# Patient Record
Sex: Female | Born: 1950
Health system: Southern US, Community
[De-identification: ages and names within clinical notes are randomized; demographics above are authoritative.]

## PROBLEM LIST (undated history)

## (undated) ENCOUNTER — Ambulatory Visit: Admission: EM | Payer: Medicare PPO | Source: Home / Self Care

## (undated) DIAGNOSIS — K219 Gastro-esophageal reflux disease without esophagitis: Secondary | ICD-10-CM

## (undated) DIAGNOSIS — G709 Myoneural disorder, unspecified: Secondary | ICD-10-CM

## (undated) DIAGNOSIS — N189 Chronic kidney disease, unspecified: Secondary | ICD-10-CM

## (undated) DIAGNOSIS — T7840XA Allergy, unspecified, initial encounter: Secondary | ICD-10-CM

## (undated) DIAGNOSIS — I2510 Atherosclerotic heart disease of native coronary artery without angina pectoris: Secondary | ICD-10-CM

## (undated) DIAGNOSIS — Z862 Personal history of diseases of the blood and blood-forming organs and certain disorders involving the immune mechanism: Secondary | ICD-10-CM

## (undated) DIAGNOSIS — I10 Essential (primary) hypertension: Secondary | ICD-10-CM

## (undated) DIAGNOSIS — Z8639 Personal history of other endocrine, nutritional and metabolic disease: Secondary | ICD-10-CM

## (undated) DIAGNOSIS — K760 Fatty (change of) liver, not elsewhere classified: Secondary | ICD-10-CM

## (undated) DIAGNOSIS — N3281 Overactive bladder: Secondary | ICD-10-CM

## (undated) DIAGNOSIS — R011 Cardiac murmur, unspecified: Secondary | ICD-10-CM

## (undated) DIAGNOSIS — M1990 Unspecified osteoarthritis, unspecified site: Secondary | ICD-10-CM

## (undated) HISTORY — DX: Chronic kidney disease, unspecified: N18.9

## (undated) HISTORY — DX: Allergy, unspecified, initial encounter: T78.40XA

## (undated) HISTORY — DX: Essential (primary) hypertension: I10

## (undated) HISTORY — PX: TURBINATE REDUCTION: SHX6157

## (undated) HISTORY — PX: TONSILLECTOMY: SUR1361

## (undated) HISTORY — PX: LAPAROSCOPY: SHX197

## (undated) HISTORY — DX: Gastro-esophageal reflux disease without esophagitis: K21.9

## (undated) HISTORY — PX: ABDOMINAL HYSTERECTOMY: SHX81

## (undated) HISTORY — DX: Unspecified osteoarthritis, unspecified site: M19.90

## (undated) HISTORY — DX: Personal history of diseases of the blood and blood-forming organs and certain disorders involving the immune mechanism: Z86.2

## (undated) HISTORY — DX: Personal history of other endocrine, nutritional and metabolic disease: Z86.39

---

## 1998-04-04 ENCOUNTER — Other Ambulatory Visit: Admission: RE | Admit: 1998-04-04 | Discharge: 1998-04-04 | Payer: Self-pay | Admitting: Obstetrics & Gynecology

## 1998-09-19 ENCOUNTER — Other Ambulatory Visit: Admission: RE | Admit: 1998-09-19 | Discharge: 1998-09-19 | Payer: Self-pay | Admitting: Obstetrics & Gynecology

## 1999-04-03 ENCOUNTER — Other Ambulatory Visit: Admission: RE | Admit: 1999-04-03 | Discharge: 1999-04-03 | Payer: Self-pay | Admitting: Obstetrics & Gynecology

## 2000-07-30 ENCOUNTER — Other Ambulatory Visit: Admission: RE | Admit: 2000-07-30 | Discharge: 2000-07-30 | Payer: Self-pay | Admitting: Obstetrics & Gynecology

## 2001-11-17 ENCOUNTER — Other Ambulatory Visit: Admission: RE | Admit: 2001-11-17 | Discharge: 2001-11-17 | Payer: Self-pay | Admitting: Obstetrics & Gynecology

## 2002-03-10 ENCOUNTER — Encounter: Payer: Self-pay | Admitting: Internal Medicine

## 2003-03-30 ENCOUNTER — Other Ambulatory Visit: Admission: RE | Admit: 2003-03-30 | Discharge: 2003-03-30 | Payer: Self-pay | Admitting: Obstetrics & Gynecology

## 2005-01-03 ENCOUNTER — Other Ambulatory Visit: Admission: RE | Admit: 2005-01-03 | Discharge: 2005-01-03 | Payer: Self-pay | Admitting: Obstetrics & Gynecology

## 2007-03-04 ENCOUNTER — Ambulatory Visit (HOSPITAL_COMMUNITY): Admission: RE | Admit: 2007-03-04 | Discharge: 2007-03-04 | Payer: Self-pay

## 2007-09-08 DIAGNOSIS — Z862 Personal history of diseases of the blood and blood-forming organs and certain disorders involving the immune mechanism: Secondary | ICD-10-CM

## 2007-09-08 DIAGNOSIS — R197 Diarrhea, unspecified: Secondary | ICD-10-CM

## 2007-09-08 DIAGNOSIS — Z8639 Personal history of other endocrine, nutritional and metabolic disease: Secondary | ICD-10-CM

## 2007-09-08 DIAGNOSIS — M069 Rheumatoid arthritis, unspecified: Secondary | ICD-10-CM | POA: Insufficient documentation

## 2007-09-08 DIAGNOSIS — K760 Fatty (change of) liver, not elsewhere classified: Secondary | ICD-10-CM

## 2007-09-08 HISTORY — DX: Personal history of diseases of the blood and blood-forming organs and certain disorders involving the immune mechanism: Z86.2

## 2007-09-08 HISTORY — DX: Personal history of diseases of the blood and blood-forming organs and certain disorders involving the immune mechanism: Z86.39

## 2007-09-10 ENCOUNTER — Ambulatory Visit: Payer: Self-pay | Admitting: Internal Medicine

## 2007-10-07 ENCOUNTER — Telehealth: Payer: Self-pay | Admitting: Internal Medicine

## 2009-02-11 ENCOUNTER — Encounter (INDEPENDENT_AMBULATORY_CARE_PROVIDER_SITE_OTHER): Payer: Self-pay | Admitting: *Deleted

## 2009-10-21 ENCOUNTER — Telehealth: Payer: Self-pay | Admitting: Internal Medicine

## 2010-03-07 NOTE — Letter (Signed)
Summary: Colonoscopy Letter  [facility]  [address] [place], [place] [address]   Phone: [phone]  Fax: [phone]      February 11, 2009 MRN: [id number]   [facility] [address] [place], [place]  [address]   Dear Ms. Heldman,   According to your medical record, it is time for you to schedule a Colonoscopy. The American Cancer Society recommends this procedure as a method to detect early colon cancer. Patients with a family history of colon cancer, or a personal history of colon polyps or inflammatory bowel disease are at increased risk.  This letter has beeen generated based on the recommendations made at the time of your procedure. If you feel that in your particular situation this may no longer apply, please contact our office.  Please call our office at [phone] to schedule this appointment or to update your records at your earliest convenience.  Thank you for cooperating with [place] to provide you with the very best care possible.   Sincerely,  Hedwig Morton. Juanda Chance, M.D.  [facility] [phone]

## 2010-03-07 NOTE — Progress Notes (Signed)
Summary: Schedule Colonoscopy  Phone Note Outgoing Call Call back at [facility] Phone [phone]   Call placed by: Harlow Mares CMA [facility]),  October 21, 2009 9:24 AM Call placed to: Patient Summary of Call: advised pt she is due for her colonoscopy but she delined.  Initial call taken by: Harlow Mares CMA [facility]),  October 21, 2009 9:24 AM

## 2013-09-04 ENCOUNTER — Encounter: Payer: Self-pay | Admitting: Internal Medicine

## 2015-05-03 ENCOUNTER — Other Ambulatory Visit: Payer: Self-pay | Admitting: Obstetrics & Gynecology

## 2015-05-03 DIAGNOSIS — R928 Other abnormal and inconclusive findings on diagnostic imaging of breast: Secondary | ICD-10-CM

## 2015-05-06 ENCOUNTER — Other Ambulatory Visit: Payer: Self-pay

## 2015-05-24 ENCOUNTER — Ambulatory Visit
Admission: RE | Admit: 2015-05-24 | Discharge: 2015-05-24 | Disposition: A | Discharge status: Home / Self Care | Payer: BC Managed Care – PPO | Source: Ambulatory Visit | Attending: Obstetrics & Gynecology | Admitting: Obstetrics & Gynecology

## 2015-05-24 DIAGNOSIS — R928 Other abnormal and inconclusive findings on diagnostic imaging of breast: Secondary | ICD-10-CM

## 2015-07-01 DIAGNOSIS — M05742 Rheumatoid arthritis with rheumatoid factor of left hand without organ or systems involvement: Secondary | ICD-10-CM

## 2015-07-01 DIAGNOSIS — J301 Allergic rhinitis due to pollen: Secondary | ICD-10-CM | POA: Insufficient documentation

## 2015-07-01 DIAGNOSIS — N959 Unspecified menopausal and perimenopausal disorder: Secondary | ICD-10-CM | POA: Insufficient documentation

## 2015-07-01 DIAGNOSIS — M05741 Rheumatoid arthritis with rheumatoid factor of right hand without organ or systems involvement: Secondary | ICD-10-CM | POA: Insufficient documentation

## 2015-07-01 DIAGNOSIS — I10 Essential (primary) hypertension: Secondary | ICD-10-CM | POA: Insufficient documentation

## 2015-07-07 ENCOUNTER — Ambulatory Visit: Payer: Self-pay | Admitting: Family Medicine

## 2015-11-09 DIAGNOSIS — M150 Primary generalized (osteo)arthritis: Secondary | ICD-10-CM | POA: Diagnosis not present

## 2015-11-09 DIAGNOSIS — M0589 Other rheumatoid arthritis with rheumatoid factor of multiple sites: Secondary | ICD-10-CM | POA: Diagnosis not present

## 2015-11-09 DIAGNOSIS — Z79899 Other long term (current) drug therapy: Secondary | ICD-10-CM | POA: Diagnosis not present

## 2015-11-15 DIAGNOSIS — M0589 Other rheumatoid arthritis with rheumatoid factor of multiple sites: Secondary | ICD-10-CM | POA: Diagnosis not present

## 2015-12-19 DIAGNOSIS — M0589 Other rheumatoid arthritis with rheumatoid factor of multiple sites: Secondary | ICD-10-CM | POA: Diagnosis not present

## 2015-12-27 DIAGNOSIS — I10 Essential (primary) hypertension: Secondary | ICD-10-CM | POA: Diagnosis not present

## 2015-12-27 DIAGNOSIS — Z79899 Other long term (current) drug therapy: Secondary | ICD-10-CM | POA: Diagnosis not present

## 2016-01-02 DIAGNOSIS — M05742 Rheumatoid arthritis with rheumatoid factor of left hand without organ or systems involvement: Secondary | ICD-10-CM | POA: Diagnosis not present

## 2016-01-02 DIAGNOSIS — M05741 Rheumatoid arthritis with rheumatoid factor of right hand without organ or systems involvement: Secondary | ICD-10-CM | POA: Diagnosis not present

## 2016-01-02 DIAGNOSIS — Z23 Encounter for immunization: Secondary | ICD-10-CM | POA: Diagnosis not present

## 2016-01-02 DIAGNOSIS — Z6841 Body Mass Index (BMI) 40.0 and over, adult: Secondary | ICD-10-CM | POA: Diagnosis not present

## 2016-01-02 DIAGNOSIS — I10 Essential (primary) hypertension: Secondary | ICD-10-CM | POA: Diagnosis not present

## 2016-01-02 DIAGNOSIS — J301 Allergic rhinitis due to pollen: Secondary | ICD-10-CM | POA: Diagnosis not present

## 2016-01-02 DIAGNOSIS — N959 Unspecified menopausal and perimenopausal disorder: Secondary | ICD-10-CM | POA: Diagnosis not present

## 2016-01-02 DIAGNOSIS — Z79899 Other long term (current) drug therapy: Secondary | ICD-10-CM | POA: Diagnosis not present

## 2016-01-19 DIAGNOSIS — M0589 Other rheumatoid arthritis with rheumatoid factor of multiple sites: Secondary | ICD-10-CM | POA: Diagnosis not present

## 2016-02-16 DIAGNOSIS — M0589 Other rheumatoid arthritis with rheumatoid factor of multiple sites: Secondary | ICD-10-CM | POA: Diagnosis not present

## 2016-03-15 DIAGNOSIS — M0589 Other rheumatoid arthritis with rheumatoid factor of multiple sites: Secondary | ICD-10-CM | POA: Diagnosis not present

## 2016-03-27 DIAGNOSIS — H35363 Drusen (degenerative) of macula, bilateral: Secondary | ICD-10-CM | POA: Diagnosis not present

## 2016-04-19 DIAGNOSIS — M0589 Other rheumatoid arthritis with rheumatoid factor of multiple sites: Secondary | ICD-10-CM | POA: Diagnosis not present

## 2016-05-09 DIAGNOSIS — M0589 Other rheumatoid arthritis with rheumatoid factor of multiple sites: Secondary | ICD-10-CM | POA: Diagnosis not present

## 2016-05-09 DIAGNOSIS — Z79899 Other long term (current) drug therapy: Secondary | ICD-10-CM | POA: Diagnosis not present

## 2016-05-09 DIAGNOSIS — M150 Primary generalized (osteo)arthritis: Secondary | ICD-10-CM | POA: Diagnosis not present

## 2016-05-09 DIAGNOSIS — M25561 Pain in right knee: Secondary | ICD-10-CM | POA: Diagnosis not present

## 2016-05-09 DIAGNOSIS — M170 Bilateral primary osteoarthritis of knee: Secondary | ICD-10-CM | POA: Diagnosis not present

## 2016-05-25 DIAGNOSIS — M0589 Other rheumatoid arthritis with rheumatoid factor of multiple sites: Secondary | ICD-10-CM | POA: Diagnosis not present

## 2016-06-05 DIAGNOSIS — Z1272 Encounter for screening for malignant neoplasm of vagina: Secondary | ICD-10-CM | POA: Diagnosis not present

## 2016-06-05 DIAGNOSIS — Z1231 Encounter for screening mammogram for malignant neoplasm of breast: Secondary | ICD-10-CM | POA: Diagnosis not present

## 2016-06-05 DIAGNOSIS — Z124 Encounter for screening for malignant neoplasm of cervix: Secondary | ICD-10-CM | POA: Diagnosis not present

## 2016-06-05 DIAGNOSIS — Z01419 Encounter for gynecological examination (general) (routine) without abnormal findings: Secondary | ICD-10-CM | POA: Diagnosis not present

## 2016-06-05 DIAGNOSIS — Z6841 Body Mass Index (BMI) 40.0 and over, adult: Secondary | ICD-10-CM | POA: Diagnosis not present

## 2016-06-06 DIAGNOSIS — D492 Neoplasm of unspecified behavior of bone, soft tissue, and skin: Secondary | ICD-10-CM | POA: Diagnosis not present

## 2016-06-06 DIAGNOSIS — I872 Venous insufficiency (chronic) (peripheral): Secondary | ICD-10-CM | POA: Diagnosis not present

## 2016-06-06 DIAGNOSIS — L820 Inflamed seborrheic keratosis: Secondary | ICD-10-CM | POA: Diagnosis not present

## 2016-06-06 DIAGNOSIS — D229 Melanocytic nevi, unspecified: Secondary | ICD-10-CM | POA: Diagnosis not present

## 2016-06-12 DIAGNOSIS — L089 Local infection of the skin and subcutaneous tissue, unspecified: Secondary | ICD-10-CM | POA: Diagnosis not present

## 2016-06-12 DIAGNOSIS — Z5189 Encounter for other specified aftercare: Secondary | ICD-10-CM | POA: Diagnosis not present

## 2016-06-16 DIAGNOSIS — M5440 Lumbago with sciatica, unspecified side: Secondary | ICD-10-CM | POA: Diagnosis not present

## 2016-06-22 DIAGNOSIS — M0589 Other rheumatoid arthritis with rheumatoid factor of multiple sites: Secondary | ICD-10-CM | POA: Diagnosis not present

## 2016-06-27 DIAGNOSIS — M25562 Pain in left knee: Secondary | ICD-10-CM | POA: Diagnosis not present

## 2016-06-27 DIAGNOSIS — M170 Bilateral primary osteoarthritis of knee: Secondary | ICD-10-CM | POA: Diagnosis not present

## 2016-06-27 DIAGNOSIS — M25561 Pain in right knee: Secondary | ICD-10-CM | POA: Diagnosis not present

## 2016-07-04 ENCOUNTER — Encounter: Payer: Self-pay | Admitting: Obstetrics & Gynecology

## 2016-07-12 DIAGNOSIS — M25552 Pain in left hip: Secondary | ICD-10-CM | POA: Diagnosis not present

## 2016-07-12 DIAGNOSIS — M25551 Pain in right hip: Secondary | ICD-10-CM | POA: Diagnosis not present

## 2016-07-12 DIAGNOSIS — M25561 Pain in right knee: Secondary | ICD-10-CM | POA: Diagnosis not present

## 2016-07-12 DIAGNOSIS — M0589 Other rheumatoid arthritis with rheumatoid factor of multiple sites: Secondary | ICD-10-CM | POA: Diagnosis not present

## 2016-07-12 DIAGNOSIS — M150 Primary generalized (osteo)arthritis: Secondary | ICD-10-CM | POA: Diagnosis not present

## 2016-07-12 DIAGNOSIS — M170 Bilateral primary osteoarthritis of knee: Secondary | ICD-10-CM | POA: Diagnosis not present

## 2016-07-12 DIAGNOSIS — Z79899 Other long term (current) drug therapy: Secondary | ICD-10-CM | POA: Diagnosis not present

## 2016-07-20 ENCOUNTER — Telehealth: Payer: Self-pay | Admitting: Family Medicine

## 2016-07-20 NOTE — Telephone Encounter (Signed)
ROI to [facility]

## 2016-07-23 DIAGNOSIS — M0589 Other rheumatoid arthritis with rheumatoid factor of multiple sites: Secondary | ICD-10-CM | POA: Diagnosis not present

## 2016-07-23 DIAGNOSIS — R5383 Other fatigue: Secondary | ICD-10-CM | POA: Diagnosis not present

## 2016-07-24 DIAGNOSIS — M170 Bilateral primary osteoarthritis of knee: Secondary | ICD-10-CM | POA: Diagnosis not present

## 2016-07-24 DIAGNOSIS — M25561 Pain in right knee: Secondary | ICD-10-CM | POA: Diagnosis not present

## 2016-07-24 DIAGNOSIS — M0589 Other rheumatoid arthritis with rheumatoid factor of multiple sites: Secondary | ICD-10-CM | POA: Diagnosis not present

## 2016-07-24 DIAGNOSIS — Z79899 Other long term (current) drug therapy: Secondary | ICD-10-CM | POA: Diagnosis not present

## 2016-07-24 DIAGNOSIS — M150 Primary generalized (osteo)arthritis: Secondary | ICD-10-CM | POA: Diagnosis not present

## 2016-07-26 NOTE — Telephone Encounter (Signed)
Rec'd from [facility] forward 50 pages to Dr. Caryl Bis

## 2016-08-15 DIAGNOSIS — M150 Primary generalized (osteo)arthritis: Secondary | ICD-10-CM | POA: Diagnosis not present

## 2016-08-15 DIAGNOSIS — M0589 Other rheumatoid arthritis with rheumatoid factor of multiple sites: Secondary | ICD-10-CM | POA: Diagnosis not present

## 2016-08-15 DIAGNOSIS — M25561 Pain in right knee: Secondary | ICD-10-CM | POA: Diagnosis not present

## 2016-08-15 DIAGNOSIS — Z79899 Other long term (current) drug therapy: Secondary | ICD-10-CM | POA: Diagnosis not present

## 2016-08-21 ENCOUNTER — Telehealth: Payer: Self-pay | Admitting: Family Medicine

## 2016-08-21 DIAGNOSIS — M0589 Other rheumatoid arthritis with rheumatoid factor of multiple sites: Secondary | ICD-10-CM | POA: Diagnosis not present

## 2016-08-21 NOTE — Telephone Encounter (Signed)
Called and left message for Wanda Lang in Medical Record to find out when she sent out records to the office.

## 2016-08-21 NOTE — Telephone Encounter (Signed)
I have not received records.

## 2016-08-21 NOTE — Telephone Encounter (Signed)
Pt called and stated that she had medical records sent here from [facility] center. Pt is not yet establish she is establishing next week with Dr. Caryl Bis, Pt is trying to get set up with a gastroenterologist but they will not make her an appt until they have a copy of the records from [facility]. She is looking to go to [facility]. Please advise, thank you!  Call pt @ [phone] (may leave msg)

## 2016-08-30 ENCOUNTER — Ambulatory Visit: Payer: No Typology Code available for payment source | Admitting: Family Medicine

## 2016-09-02 IMAGING — MG MM DIAG BREAST TOMO UNI RIGHT
4 series · 4 of 12 positions shown · non-contrast
Comparison: Previous exam(s).

ACR Breast Density Category a: The breast tissue is almost entirely
fatty.

CLINICAL DATA: Screening recall for possible right breast mass.

EXAM:
2D DIGITAL DIAGNOSTIC UNILATERAL RIGHT MAMMOGRAM WITH CAD AND
ADJUNCT TOMO
RIGHT BREAST ULTRASOUND

[R CC]
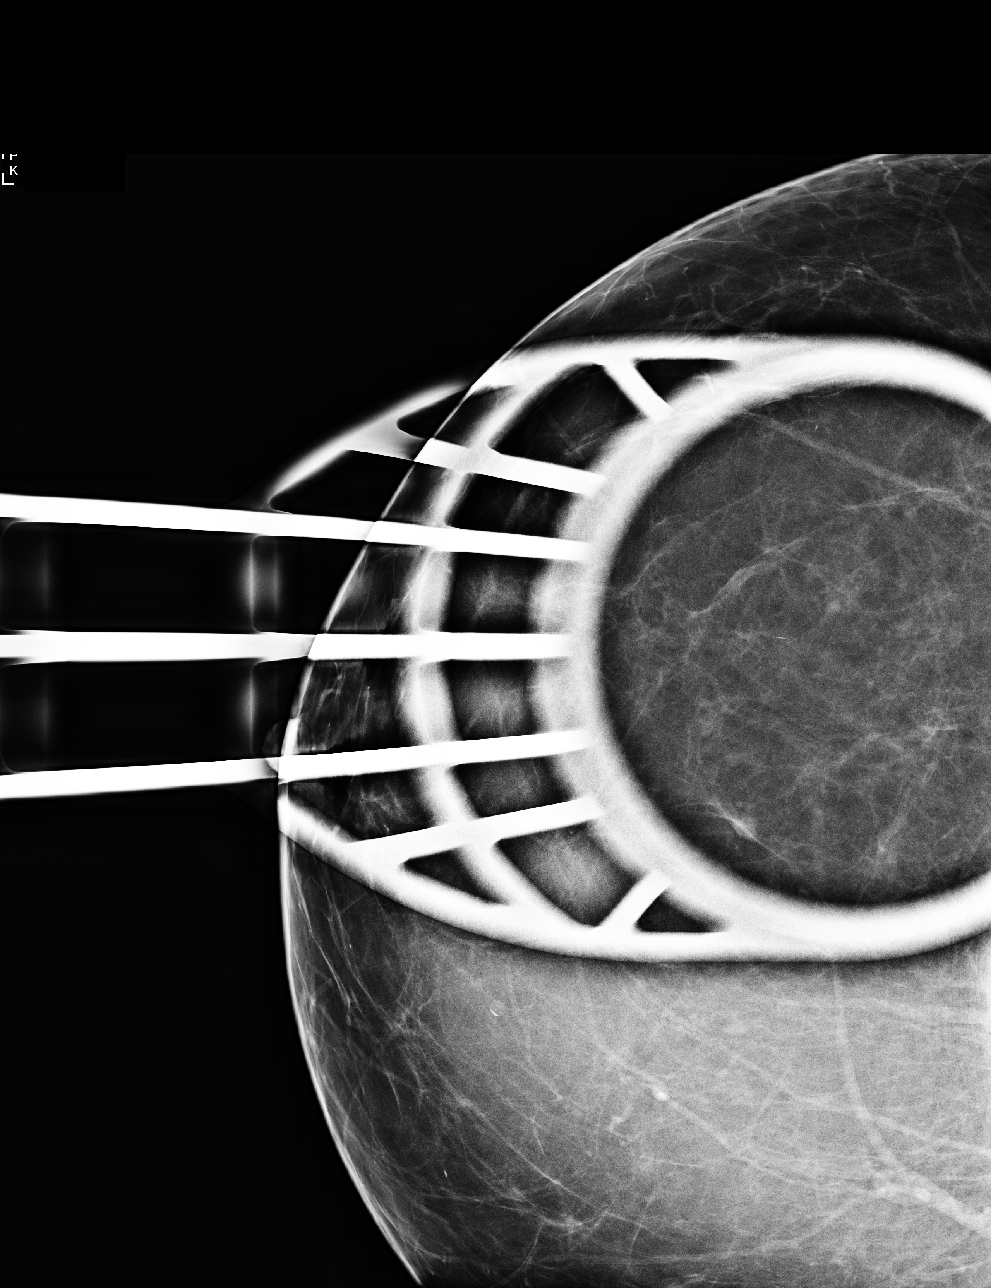

[R MLO]
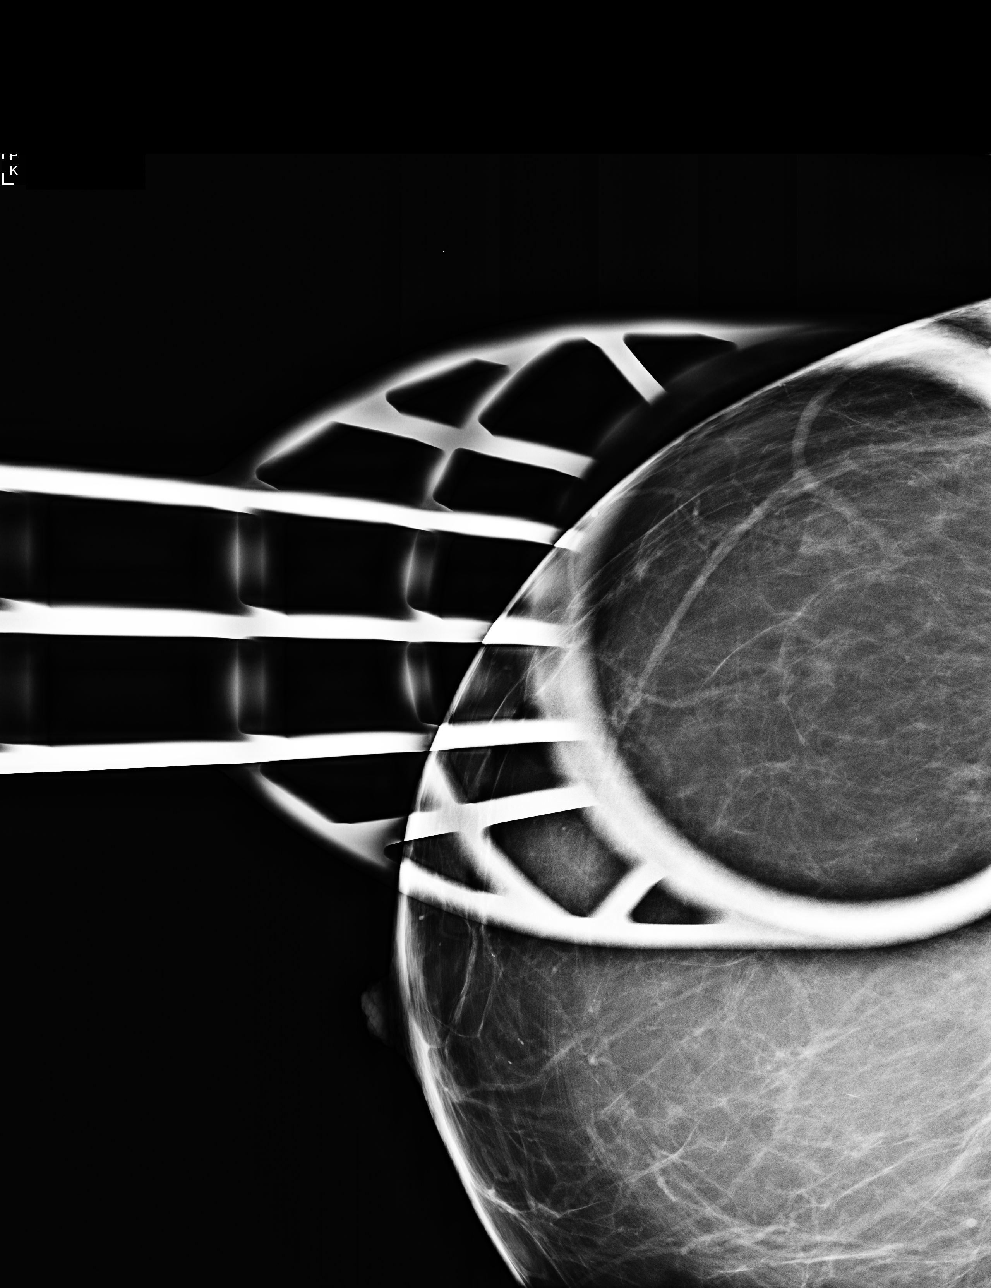

[R CC tomo · tomo slice 37/73.0]
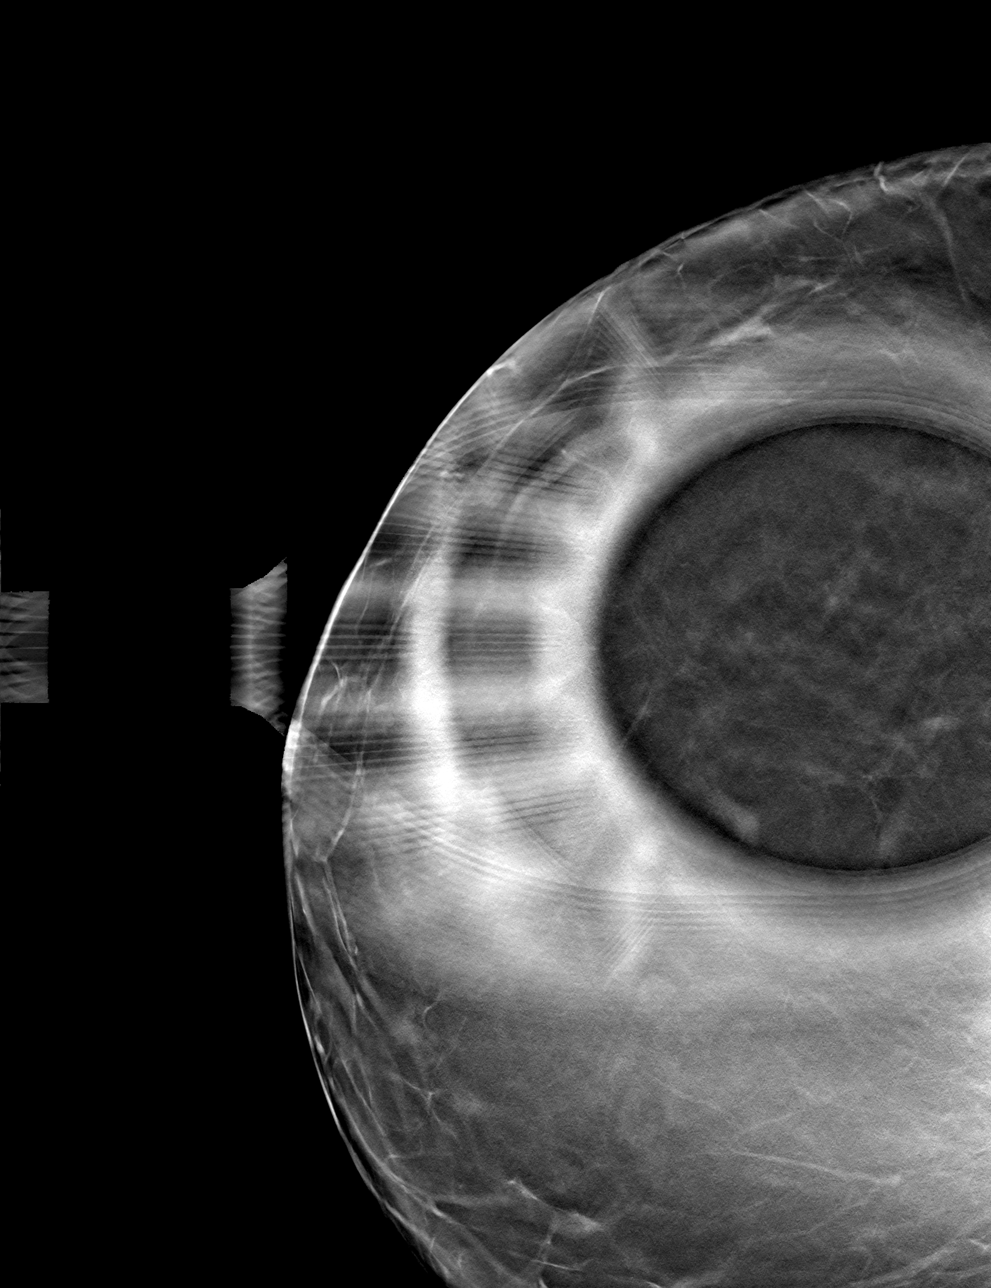

[R MLO tomo · tomo slice 43/84.0]
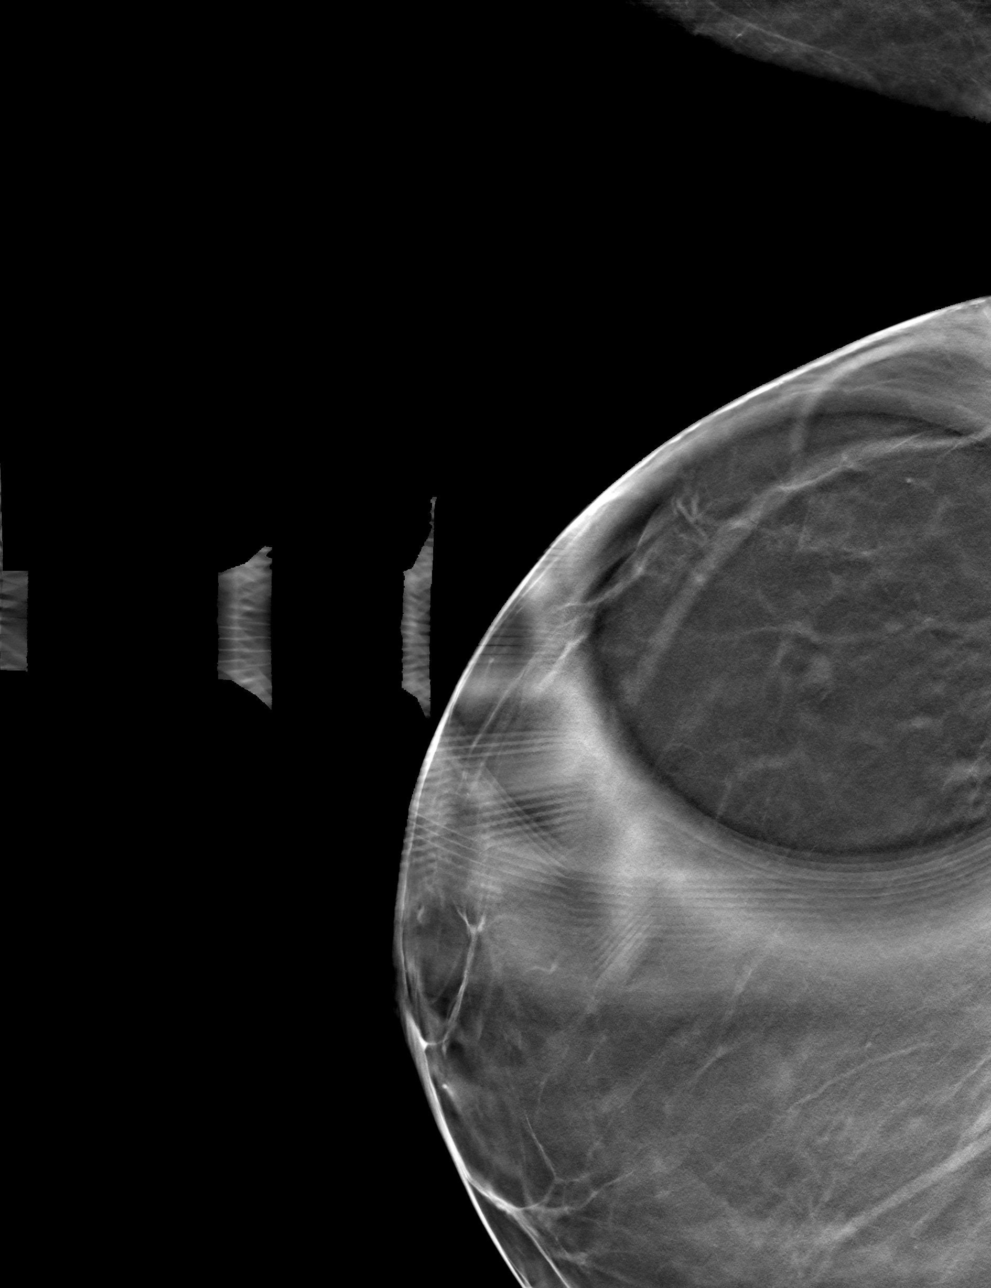

[4 of 12 positions shown; findings below may reference images not displayed]

FINDINGS: Spot compression CC and MLO tomograms were performed of the right
breast. The initially questioned oval circumscribed mass in the
slightly upper outer right breast measuring approximately 4-5 mm is
less well seen on today's images, better visualized on the initial
screening mammogram images.

Mammographic images were processed with CAD.

Physical examination of the upper-outer right breast does not reveal
any palpable masses.

Targeted ultrasound of the right breast was performed demonstrating
a cluster of microcysts at the [DATE] position 6 cm from the nipple
measuring 0.4 x 0.3 x 0.3 cm. This corresponds well with mammography
findings.
IMPRESSION: Cluster of microcysts in the right breast. There is no mammographic
evidence of malignancy in the right breast.

RECOMMENDATION:
Annual routine screening mammography, for which the patient will be
due April 2016.

I have discussed the findings and recommendations with the patient.
Results were also provided in writing at the conclusion of the
visit. If applicable, a reminder letter will be sent to the patient
regarding the next appointment.

BI-RADS CATEGORY  2: Benign.

## 2016-09-05 DIAGNOSIS — M25562 Pain in left knee: Secondary | ICD-10-CM | POA: Diagnosis not present

## 2016-09-05 DIAGNOSIS — M79671 Pain in right foot: Secondary | ICD-10-CM | POA: Diagnosis not present

## 2016-09-05 DIAGNOSIS — M25561 Pain in right knee: Secondary | ICD-10-CM | POA: Diagnosis not present

## 2016-09-05 DIAGNOSIS — M79672 Pain in left foot: Secondary | ICD-10-CM | POA: Diagnosis not present

## 2016-09-13 DIAGNOSIS — M79672 Pain in left foot: Secondary | ICD-10-CM | POA: Diagnosis not present

## 2016-09-13 DIAGNOSIS — M25562 Pain in left knee: Secondary | ICD-10-CM | POA: Diagnosis not present

## 2016-09-13 DIAGNOSIS — M79671 Pain in right foot: Secondary | ICD-10-CM | POA: Diagnosis not present

## 2016-09-13 DIAGNOSIS — M25561 Pain in right knee: Secondary | ICD-10-CM | POA: Diagnosis not present

## 2016-09-17 DIAGNOSIS — M25562 Pain in left knee: Secondary | ICD-10-CM | POA: Diagnosis not present

## 2016-09-17 DIAGNOSIS — M79671 Pain in right foot: Secondary | ICD-10-CM | POA: Diagnosis not present

## 2016-09-17 DIAGNOSIS — M25561 Pain in right knee: Secondary | ICD-10-CM | POA: Diagnosis not present

## 2016-09-17 DIAGNOSIS — M79672 Pain in left foot: Secondary | ICD-10-CM | POA: Diagnosis not present

## 2016-09-18 DIAGNOSIS — M0589 Other rheumatoid arthritis with rheumatoid factor of multiple sites: Secondary | ICD-10-CM | POA: Diagnosis not present

## 2016-09-20 DIAGNOSIS — M25562 Pain in left knee: Secondary | ICD-10-CM | POA: Diagnosis not present

## 2016-09-20 DIAGNOSIS — M25561 Pain in right knee: Secondary | ICD-10-CM | POA: Diagnosis not present

## 2016-09-20 DIAGNOSIS — M79672 Pain in left foot: Secondary | ICD-10-CM | POA: Diagnosis not present

## 2016-09-20 DIAGNOSIS — M79671 Pain in right foot: Secondary | ICD-10-CM | POA: Diagnosis not present

## 2016-09-25 DIAGNOSIS — M79672 Pain in left foot: Secondary | ICD-10-CM | POA: Diagnosis not present

## 2016-09-25 DIAGNOSIS — M25561 Pain in right knee: Secondary | ICD-10-CM | POA: Diagnosis not present

## 2016-09-25 DIAGNOSIS — M25562 Pain in left knee: Secondary | ICD-10-CM | POA: Diagnosis not present

## 2016-09-25 DIAGNOSIS — M79671 Pain in right foot: Secondary | ICD-10-CM | POA: Diagnosis not present

## 2016-09-27 DIAGNOSIS — M25561 Pain in right knee: Secondary | ICD-10-CM | POA: Diagnosis not present

## 2016-09-27 DIAGNOSIS — M79672 Pain in left foot: Secondary | ICD-10-CM | POA: Diagnosis not present

## 2016-09-27 DIAGNOSIS — M25562 Pain in left knee: Secondary | ICD-10-CM | POA: Diagnosis not present

## 2016-09-27 DIAGNOSIS — M79671 Pain in right foot: Secondary | ICD-10-CM | POA: Diagnosis not present

## 2016-10-01 DIAGNOSIS — M79671 Pain in right foot: Secondary | ICD-10-CM | POA: Diagnosis not present

## 2016-10-01 DIAGNOSIS — M79672 Pain in left foot: Secondary | ICD-10-CM | POA: Diagnosis not present

## 2016-10-01 DIAGNOSIS — M25561 Pain in right knee: Secondary | ICD-10-CM | POA: Diagnosis not present

## 2016-10-01 DIAGNOSIS — M25562 Pain in left knee: Secondary | ICD-10-CM | POA: Diagnosis not present

## 2016-10-03 DIAGNOSIS — M25561 Pain in right knee: Secondary | ICD-10-CM | POA: Diagnosis not present

## 2016-10-03 DIAGNOSIS — M25562 Pain in left knee: Secondary | ICD-10-CM | POA: Diagnosis not present

## 2016-10-03 DIAGNOSIS — M79672 Pain in left foot: Secondary | ICD-10-CM | POA: Diagnosis not present

## 2016-10-03 DIAGNOSIS — M79671 Pain in right foot: Secondary | ICD-10-CM | POA: Diagnosis not present

## 2016-10-09 DIAGNOSIS — M79672 Pain in left foot: Secondary | ICD-10-CM | POA: Diagnosis not present

## 2016-10-09 DIAGNOSIS — M25561 Pain in right knee: Secondary | ICD-10-CM | POA: Diagnosis not present

## 2016-10-09 DIAGNOSIS — M25562 Pain in left knee: Secondary | ICD-10-CM | POA: Diagnosis not present

## 2016-10-09 DIAGNOSIS — M79671 Pain in right foot: Secondary | ICD-10-CM | POA: Diagnosis not present

## 2016-10-11 DIAGNOSIS — M25561 Pain in right knee: Secondary | ICD-10-CM | POA: Diagnosis not present

## 2016-10-11 DIAGNOSIS — M25562 Pain in left knee: Secondary | ICD-10-CM | POA: Diagnosis not present

## 2016-10-11 DIAGNOSIS — M79671 Pain in right foot: Secondary | ICD-10-CM | POA: Diagnosis not present

## 2016-10-11 DIAGNOSIS — M79672 Pain in left foot: Secondary | ICD-10-CM | POA: Diagnosis not present

## 2016-10-16 DIAGNOSIS — M25562 Pain in left knee: Secondary | ICD-10-CM | POA: Diagnosis not present

## 2016-10-16 DIAGNOSIS — M25561 Pain in right knee: Secondary | ICD-10-CM | POA: Diagnosis not present

## 2016-10-16 DIAGNOSIS — M79672 Pain in left foot: Secondary | ICD-10-CM | POA: Diagnosis not present

## 2016-10-16 DIAGNOSIS — M0589 Other rheumatoid arthritis with rheumatoid factor of multiple sites: Secondary | ICD-10-CM | POA: Diagnosis not present

## 2016-10-16 DIAGNOSIS — M79671 Pain in right foot: Secondary | ICD-10-CM | POA: Diagnosis not present

## 2016-10-18 DIAGNOSIS — M79671 Pain in right foot: Secondary | ICD-10-CM | POA: Diagnosis not present

## 2016-10-18 DIAGNOSIS — M79672 Pain in left foot: Secondary | ICD-10-CM | POA: Diagnosis not present

## 2016-10-18 DIAGNOSIS — M25561 Pain in right knee: Secondary | ICD-10-CM | POA: Diagnosis not present

## 2016-10-18 DIAGNOSIS — M25562 Pain in left knee: Secondary | ICD-10-CM | POA: Diagnosis not present

## 2016-10-22 ENCOUNTER — Ambulatory Visit: Payer: No Typology Code available for payment source | Admitting: Family Medicine

## 2016-10-26 DIAGNOSIS — M25561 Pain in right knee: Secondary | ICD-10-CM | POA: Diagnosis not present

## 2016-10-26 DIAGNOSIS — M79672 Pain in left foot: Secondary | ICD-10-CM | POA: Diagnosis not present

## 2016-10-26 DIAGNOSIS — M79671 Pain in right foot: Secondary | ICD-10-CM | POA: Diagnosis not present

## 2016-10-26 DIAGNOSIS — M25562 Pain in left knee: Secondary | ICD-10-CM | POA: Diagnosis not present

## 2016-11-02 ENCOUNTER — Ambulatory Visit (INDEPENDENT_AMBULATORY_CARE_PROVIDER_SITE_OTHER): Payer: Medicare Other | Admitting: Family Medicine

## 2016-11-02 ENCOUNTER — Encounter: Payer: Self-pay | Admitting: Family Medicine

## 2016-11-02 VITALS — BP 110/72 | HR 87 | Temp 97.8°F | Ht 64.5 in | Wt 263.8 lb

## 2016-11-02 DIAGNOSIS — M25561 Pain in right knee: Secondary | ICD-10-CM | POA: Diagnosis not present

## 2016-11-02 DIAGNOSIS — J309 Allergic rhinitis, unspecified: Secondary | ICD-10-CM | POA: Diagnosis not present

## 2016-11-02 DIAGNOSIS — G250 Essential tremor: Secondary | ICD-10-CM | POA: Insufficient documentation

## 2016-11-02 DIAGNOSIS — Z8679 Personal history of other diseases of the circulatory system: Secondary | ICD-10-CM | POA: Insufficient documentation

## 2016-11-02 DIAGNOSIS — M79671 Pain in right foot: Secondary | ICD-10-CM | POA: Diagnosis not present

## 2016-11-02 DIAGNOSIS — I10 Essential (primary) hypertension: Secondary | ICD-10-CM | POA: Diagnosis not present

## 2016-11-02 DIAGNOSIS — R251 Tremor, unspecified: Secondary | ICD-10-CM | POA: Diagnosis not present

## 2016-11-02 DIAGNOSIS — R197 Diarrhea, unspecified: Secondary | ICD-10-CM | POA: Diagnosis not present

## 2016-11-02 DIAGNOSIS — M25562 Pain in left knee: Secondary | ICD-10-CM | POA: Diagnosis not present

## 2016-11-02 DIAGNOSIS — K529 Noninfective gastroenteritis and colitis, unspecified: Secondary | ICD-10-CM | POA: Diagnosis not present

## 2016-11-02 DIAGNOSIS — M79672 Pain in left foot: Secondary | ICD-10-CM | POA: Diagnosis not present

## 2016-11-02 NOTE — Assessment & Plan Note (Signed)
Suspect symptoms of cough and postnasal drip related to allergies. Suggested adding Flonase or Nasonex. Continue other medications.

## 2016-11-02 NOTE — Progress Notes (Signed)
[name], MD Phone: [phone]  Wanda Lang is a 66 y.o. female who presents today for new patient visit.  Patient notes she has allergic rhinitis. Has postnasal drip intermittently. Occasional dry cough. Occasional congestion in her nose and ears. Takes Claritin. Occasional Sudafed. Some Mucinex. Has used Nasonex and Flonase in the past.  Notes intermittently over the last year her hands will shake if she is holding something such as paper or an ipad. Does not bother her when she is drinking or writing or eating. Comes and goes. Her mother had similar symptoms.  Hypertension: Not checking at home. Taking HCTZ. No chest pain, shortness breath, or edema.  Chronic diarrhea: Has had this since 2001 when she went on medication for rheumatoid arthritis. Has had 2 colonoscopies since then nobody is unable to give her a diagnosis. Blood in her stool. No abdominal pain.  Active Ambulatory Problems    Diagnosis Date Noted  . FATTY LIVER DISEASE 09/08/2007  . ARTHRITIS, RHEUMATOID 09/08/2007  . DIARRHEA 09/08/2007  . LIVER FUNCTION TESTS, ABNORMAL, HX OF 09/08/2007  . Allergic rhinitis 11/02/2016  . Tremor 11/02/2016  . Hypertension 11/02/2016   Resolved Ambulatory Problems    Diagnosis Date Noted  . No Resolved Ambulatory Problems   Past Medical History:  Diagnosis Date  . Allergy   . Arthritis   . Chronic kidney disease   . GERD (gastroesophageal reflux disease)   . Hypertension     Family History  Problem Relation Age of Onset  . Alcohol abuse Mother   . Arthritis Mother   . Hyperlipidemia Mother   . Heart disease Mother   . Stroke Mother   . Hypertension Mother   . Depression Mother   . Anxiety disorder Mother   . Arthritis Father   . Lung cancer Paternal Grandfather   . Kidney disease Paternal Grandfather     Social History   Social History  . Marital status: Married    Spouse name: N/A  . Number of children: N/A  . Years of education: N/A    Occupational History  . Not on file.   Social History Main Topics  . Smoking status: Never Smoker  . Smokeless tobacco: Never Used  . Alcohol use Not on file  . Drug use: Unknown  . Sexual activity: Not on file   Other Topics Concern  . Not on file   Social History Narrative  . No narrative on file    ROS  General:  Negative for nexplained weight loss, fever Skin: Negative for new or changing mole, sore that won't heal HEENT: Negative for trouble hearing, trouble seeing, ringing in ears, mouth sores, hoarseness, change in voice, dysphagia. CV:  Negative for chest pain, dyspnea, edema, palpitations Resp: Positive for cough, negative for dyspnea, hemoptysis GI: Positive for diarrhea, Negative for nausea, vomiting, constipation, abdominal pain, melena, hematochezia. GU: Negative for dysuria, incontinence, urinary hesitance, hematuria, vaginal or penile discharge, polyuria, sexual difficulty, lumps in testicle or breasts MSK: Negative for muscle cramps or aches, joint pain or swelling Neuro: Positive for tremor, Negative for headaches, weakness, numbness, dizziness, passing out/fainting Psych: Negative for depression, anxiety, memory problems  Objective  Physical Exam Vitals:   11/02/16 1544  BP: 110/72  Pulse: 87  Temp: 97.8 F (36.6 C)  SpO2: 96%    BP Readings from Last 3 Encounters:  11/02/16 110/72  09/10/07 128/78   Wt Readings from Last 3 Encounters:  11/02/16 263 lb 12.8 oz (119.7 kg)  09/10/07 Marland Kitchen)  266 lb 2.1 oz (120.7 kg)    Physical Exam  Constitutional: No distress.  HENT:  Head: Normocephalic and atraumatic.  Mouth/Throat: Oropharynx is clear and moist. No oropharyngeal exudate.  Eyes: Pupils are equal, round, and reactive to light. Conjunctivae are normal.  Cardiovascular: Normal rate, regular rhythm and normal heart sounds.   Pulmonary/Chest: Effort normal and breath sounds normal.  Abdominal: Soft. Bowel sounds are normal. She exhibits no  distension. There is no tenderness. There is no rebound and no guarding.  Musculoskeletal: She exhibits no edema.  Neurological: She is alert.  CN 3-12 intact, 5/5 strength in bilateral biceps, triceps, grip, quads, hamstrings, plantar and dorsiflexion, sensation to light touch intact in bilateral UE and LE, normal gait, normal finger to nose, no tremor noted, no cogwheel rigidity   Skin: Skin is warm and dry. She is not diaphoretic.  Psychiatric: Mood and affect normal.     Assessment/Plan:   DIARRHEA Chronic issue. We'll refer to GI for reevaluation.  Allergic rhinitis Suspect symptoms of cough and postnasal drip related to allergies. Suggested adding Flonase or Nasonex. Continue other medications.  Tremor Suspect benign essential tremor. She'll monitor. If worsens she'll let us know.  Hypertension Well-controlled. Continue current medication. Follow-up in 3 months.   Orders Placed This Encounter  Procedures  . Ambulatory referral to Gastroenterology    Referral Priority:   Routine    Referral Type:   Consultation    Referral Reason:   Specialty Services Required    Number of Visits Requested:   [place], MD [place]

## 2016-11-02 NOTE — Assessment & Plan Note (Signed)
Well-controlled. Continue current medication. Follow-up in 3 months.

## 2016-11-02 NOTE — Assessment & Plan Note (Signed)
Chronic issue. We'll refer to GI for reevaluation.

## 2016-11-02 NOTE — Assessment & Plan Note (Signed)
Suspect benign essential tremor. She'll monitor. If worsens she'll let us know.

## 2016-11-02 NOTE — Patient Instructions (Signed)
Nice to meet you. Please try Flonase for your allergies.  Please monitor your tremor. We will refer you to GI.

## 2016-11-09 ENCOUNTER — Ambulatory Visit (INDEPENDENT_AMBULATORY_CARE_PROVIDER_SITE_OTHER): Payer: Medicare Other | Admitting: Family Medicine

## 2016-11-09 ENCOUNTER — Encounter: Payer: Self-pay | Admitting: Family Medicine

## 2016-11-09 DIAGNOSIS — D849 Immunodeficiency, unspecified: Secondary | ICD-10-CM

## 2016-11-09 DIAGNOSIS — J0100 Acute maxillary sinusitis, unspecified: Secondary | ICD-10-CM

## 2016-11-09 DIAGNOSIS — J329 Chronic sinusitis, unspecified: Secondary | ICD-10-CM | POA: Insufficient documentation

## 2016-11-09 DIAGNOSIS — J320 Chronic maxillary sinusitis: Secondary | ICD-10-CM | POA: Insufficient documentation

## 2016-11-09 DIAGNOSIS — D899 Disorder involving the immune mechanism, unspecified: Secondary | ICD-10-CM

## 2016-11-09 MED ORDER — GUAIFENESIN-CODEINE 100-10 MG/5ML PO SYRP
5.0000 mL | ORAL_SOLUTION | Freq: Every evening | ORAL | 0 refills | Status: DC | PRN
Start: 2016-11-09 — End: 2016-11-23

## 2016-11-09 MED ORDER — AZITHROMYCIN 250 MG PO TABS: ORAL_TABLET | ORAL | 0 refills | Status: DC

## 2016-11-09 NOTE — Patient Instructions (Addendum)
Fluids, rest. Flonase 2 spray per nostril daily. Nasal saline irrigation or spray.  Start Mucinex DM  During day. Can use cough suppressant at night. If not improving as expected  or worsening After 2-3 days.Venida Jarvis rx for antibiotics.

## 2016-11-09 NOTE — Assessment & Plan Note (Signed)
Most likely viral but as pt is immunocompromised.. will provide rx for antibiotic if not improving as expected.  Start treatment with mucolytic, nasal saline,cough suppressant and nasal steroid.

## 2016-11-09 NOTE — Progress Notes (Signed)
   Subjective:    Patient ID: Wanda Lang, female    DOB: [birth year], 66 y.o.   MRN: [id number]  Sinusitis  This is a new problem. The current episode started in the past 7 days. There has been no fever (99.5 F). The pain is moderate. Associated symptoms include congestion, coughing, ear pain, headaches, sinus pressure and a sore throat. Pertinent negatives include no chills or shortness of breath. (Post nasal drip  yellow productive cough, yellow mucus from nose fatigue) Past treatments include oral decongestants (cough syryp, antihistmines). The treatment provided moderate relief.  Cough  Associated symptoms include ear pain, a fever, headaches and a sore throat. Pertinent negatives include no chills or shortness of breath.  Fever   Associated symptoms include congestion, coughing, ear pain, headaches and a sore throat.   Nonsmoker, no hx of COPD, asthma. Immunocompromised on Arencia for RA.  Review of Systems  Constitutional: Positive for fever. Negative for chills.  HENT: Positive for congestion, ear pain, sinus pressure and sore throat.   Respiratory: Positive for cough. Negative for shortness of breath.   Neurological: Positive for headaches.   Blood pressure 130/80, pulse 74, temperature 98.5 F (36.9 C), temperature source Oral, height 5' 4.5" (1.638 m), weight 260 lb 8 oz (118.2 kg).     Objective:   Physical Exam        Assessment & Plan:

## 2016-11-09 NOTE — Assessment & Plan Note (Addendum)
On iumunomodulator.

## 2016-11-13 DIAGNOSIS — M0589 Other rheumatoid arthritis with rheumatoid factor of multiple sites: Secondary | ICD-10-CM | POA: Diagnosis not present

## 2016-11-23 ENCOUNTER — Ambulatory Visit (INDEPENDENT_AMBULATORY_CARE_PROVIDER_SITE_OTHER): Payer: Medicare Other | Admitting: Gastroenterology

## 2016-11-23 ENCOUNTER — Encounter: Payer: Self-pay | Admitting: Gastroenterology

## 2016-11-23 VITALS — BP 134/89 | HR 88 | Temp 98.0°F | Ht 64.5 in | Wt 258.4 lb

## 2016-11-23 DIAGNOSIS — K760 Fatty (change of) liver, not elsewhere classified: Secondary | ICD-10-CM | POA: Diagnosis not present

## 2016-11-23 DIAGNOSIS — R945 Abnormal results of liver function studies: Secondary | ICD-10-CM

## 2016-11-23 DIAGNOSIS — R197 Diarrhea, unspecified: Secondary | ICD-10-CM | POA: Diagnosis not present

## 2016-11-23 DIAGNOSIS — R7989 Other specified abnormal findings of blood chemistry: Secondary | ICD-10-CM

## 2016-11-23 NOTE — Progress Notes (Signed)
Cephas Darby, MD [address]  [place]  [place], [place] [address]  Main: [phone]  Fax: [phone]    Gastroenterology Consultation  Referring Provider:     Leone Haven, MD Primary Care Physician:  Leone Haven, MD Primary Gastroenterologist:  Dr. Cephas Darby Reason for Consultation:    Chronic diarrhea        HPI:   Wanda Lang is a 66 y.o. y/o female referred by Dr. Caryl Bis, Angela Adam, MD  for consultation & management of chronic diarrhea. She has history of rheumatoid arthritis currently on Celebrex, Tylenol, abatacept has been experiencing chronic nonbloody diarrhea for about 20 years. She used to have 5-6 liquidy bowel movements daily associated with urgency and sometimes incontinence. She had 2 colonoscopies without biopsies in the past. She reports that she may have had celiac test done and it was negative. She has started taking over-the-counter fiber supplement called Calcium polycarbophyll with every meal and that has significantly controlled her diarrhea. She has been on it for a long time. She is currently having about 3 formed bowel movements daily. She denies any abdominal pain, rectal bleeding, hematochezia. She is not experiencing urgency or incontinence. She denies having nocturnal diarrhea. She avoids dairy products as she experiences bloating She otherwise does not smoke, drink alcohol She denies taking NSAIDs other than Celebrex She denies family history of GI problems She denies any GI surgeries  GI Procedures:  Colonoscopy 03/10/2002 normal exam, no polyps no biopsies were performed EGD and Colonoscopy 09/23/2009 with [place] to have hyperplastic polyps in stomach  Past Medical History:  Diagnosis Date  . Allergy   . Arthritis   . Chronic kidney disease   . GERD (gastroesophageal reflux disease)   . Hypertension     Past Surgical History:  Procedure Laterality Date  . ABDOMINAL HYSTERECTOMY    .  LAPAROSCOPY      Prior to Admission medications   Medication Sig Start Date End Date Taking? Authorizing Provider  HYDROcodone-acetaminophen (NORCO/VICODIN) 5-325 MG tablet Take by mouth. 06/16/16  Yes [provider]  predniSONE (DELTASONE) 10 MG tablet 6 PO Q D X 1 DAY, THEN 5 PO Q D X 1 DAY, THEN 4 PO Q D X 1 DAY, THEN 3 PO Q D X 1 DAY, THEN 2 PO Q D X 1 DAY, THEN 1 PO Q D X 1 DAY, THEN OFF 06/16/16  Yes [provider]  Abatacept 125 MG/ML SOSY Inject into the skin.    [provider]  acetaminophen (TYLENOL) 500 MG tablet Take 500 mg by mouth as needed.    [provider]  azithromycin (ZITHROMAX) 250 MG tablet 2 tab po x 1 day then 1 tab po daily 11/09/16   Bedsole, Amy E, MD  calcium carbonate 1250 MG capsule Take by mouth.    [provider]  Calcium Carbonate-Vit D-Min (CALCIUM 1200 PO) Take by mouth.    [provider]  celecoxib (CELEBREX) 200 MG capsule Take 200 mg by mouth daily.    [provider]  Coenzyme Q10 (COQ10) 100 MG CAPS Take by mouth.    [provider]  Coenzyme Q10 (COQ10) 200 MG CAPS Take 200 mg by mouth daily.    [provider]  cyclobenzaprine (FLEXERIL) 5 MG tablet Take 5 mg by mouth as needed.    [provider]  cycloSPORINE (RESTASIS) 0.05 % ophthalmic emulsion Apply to eye.    [provider]  Estradiol (VAGIFEM) 10 MCG TABS vaginal tablet Place vaginally.    [provider]  estradiol (VIVELLE-DOT) 0.05 MG/24HR patch Place 1 patch onto the skin 2 (two) times a week.    [provider]  FIBER COMPLETE PO Take by mouth.    [provider]  Flaxseed, Linseed, (FLAXSEED OIL) 1000 MG CAPS Take by mouth.    [provider]  Glucosamine-Chondroitin 250-200 MG CAPS Take by mouth.    [provider]  guaiFENesin-codeine (ROBITUSSIN AC) 100-10 MG/5ML syrup Take 5-10 mLs by mouth at bedtime as needed for cough. 11/09/16   Bedsole,  Amy E, MD  hydrochlorothiazide (MICROZIDE) 12.5 MG capsule Take 12.5 mg by mouth 2 (two) times daily. 07/06/16   [provider]  Loratadine 10 MG CAPS Take 10 mg by mouth as needed.    [provider]  Magnesium 100 MG CAPS Take by mouth.    [provider]  Potassium 99 MG TABS Take 99 mg by mouth 2 (two) times daily.    [provider]  Vitamin D, Ergocalciferol, (DRISDOL) 50000 units CAPS capsule Take 50,000 Units by mouth every 14 (fourteen) days.    [provider]    Family History  Problem Relation Age of Onset  . Alcohol abuse Mother   . Arthritis Mother   . Hyperlipidemia Mother   . Heart disease Mother   . Stroke Mother   . Hypertension Mother   . Depression Mother   . Anxiety disorder Mother   . Arthritis Father   . Lung cancer Paternal Grandfather   . Kidney disease Paternal Grandfather      Social History  Substance Use Topics  . Smoking status: Never Smoker  . Smokeless tobacco: Never Used  . Alcohol use 0.6 oz/week    1 Glasses of wine per week    Allergies as of 11/23/2016 - Review Complete 11/23/2016  Allergen Reaction Noted  . Amoxicillin  09/08/2007  . Penicillins  09/10/2007  . Sulfonamide derivatives  09/08/2007  . Ylang-ylang [cananga oil (ylang-ylang)] Itching 11/23/2016    Review of Systems:    All systems reviewed and negative except where noted in HPI.   Physical Exam:  BP 134/89 (BP Location: Left Arm, Patient Position: Sitting, Cuff Size: Large)   Pulse 88   Temp 98 F (36.7 C) (Oral)   Ht 5' 4.5" (1.638 m)   Wt 258 lb 6.4 oz (117.2 kg)   BMI 43.67 kg/m  No LMP recorded. Patient is postmenopausal.  General:   Alert,  Well-developed, well-nourished, pleasant and cooperative in NAD Head:  Normocephalic and atraumatic. Eyes:  Sclera clear, no icterus.   Conjunctiva pink. Ears:  Normal auditory acuity. Nose:  No deformity, discharge, or lesions. Mouth:  No deformity or lesions,oropharynx pink  & moist. Neck:  Supple; no masses or thyromegaly. Lungs:  Respirations even and unlabored.  Clear throughout to auscultation.   No wheezes, crackles, or rhonchi. No acute distress. Heart:  Regular rate and rhythm; no murmurs, clicks, rubs, or gallops. Abdomen:  Normal bowel sounds.obese, Soft, non-tender and non-distended without masses, hepatosplenomegaly or hernias noted.  No guarding or rebound tenderness.   Rectal: Nor performed Msk:  Symmetrical without gross deformities. Good, equal movement & strength bilaterally. Pulses:  Normal pulses noted. Extremities:  No clubbing or edema.  No cyanosis. Neurologic:  Alert and oriented x3;  grossly normal neurologically. Skin:  Intact without significant lesions or rashes. No jaundice. Lymph Nodes:  No significant cervical  adenopathy. Psych:  Alert and cooperative. Normal mood and affect.  Imaging Studies: CT abdomen pelvis without contrast from 02/2007 revealed hepatic steatosis  Assessment and Plan:   Wanda Lang is a 66 y.o. female with obesity, rheumatoid arthritis currently in remission, consult for chronic nonbloody diarrhea. Differentials include microscopic colitis or exocrine pancreatic insufficiency or celiac disease or chronic infections or chronic functional diarrhea. Currently, her diarrhea is controlled on long-term fiber intake  - Recommend checking GI pathogen panel to look for any chronic infections - Check CRP - Check pancreatic fecal elastase, celiac serologies, CBC, ferritin - Recommend colonoscopy with biopsies - she can continue fiber that she is on as her diarrhea is controlled  Fatty liver: her LFTs are normal - Encouraged to lose weight following healthy diet and exercise  Follow up in 4-6weeks   Cephas Darby, MD

## 2016-11-26 ENCOUNTER — Telehealth: Payer: Self-pay

## 2016-11-26 NOTE — Telephone Encounter (Signed)
Following office visit patient stated she needed to check her calendar and would callback with date.   Contacted patient for colonoscopy date.  Patient states she will callback. She was in the middle of something.

## 2016-11-30 ENCOUNTER — Other Ambulatory Visit: Payer: Self-pay

## 2016-11-30 MED ORDER — HYDROCHLOROTHIAZIDE 12.5 MG PO CAPS: 12.5000 mg | ORAL_CAPSULE | Freq: Two times a day (BID) | ORAL | 3 refills | Status: DC

## 2016-11-30 NOTE — Telephone Encounter (Signed)
Last OV 11/02/16 filed under historical

## 2016-12-11 DIAGNOSIS — M0589 Other rheumatoid arthritis with rheumatoid factor of multiple sites: Secondary | ICD-10-CM | POA: Diagnosis not present

## 2017-01-08 DIAGNOSIS — M0589 Other rheumatoid arthritis with rheumatoid factor of multiple sites: Secondary | ICD-10-CM | POA: Diagnosis not present

## 2017-01-10 DIAGNOSIS — M150 Primary generalized (osteo)arthritis: Secondary | ICD-10-CM | POA: Diagnosis not present

## 2017-01-10 DIAGNOSIS — M25561 Pain in right knee: Secondary | ICD-10-CM | POA: Diagnosis not present

## 2017-01-10 DIAGNOSIS — Z79899 Other long term (current) drug therapy: Secondary | ICD-10-CM | POA: Diagnosis not present

## 2017-01-10 DIAGNOSIS — M79643 Pain in unspecified hand: Secondary | ICD-10-CM | POA: Diagnosis not present

## 2017-01-10 DIAGNOSIS — M05741 Rheumatoid arthritis with rheumatoid factor of right hand without organ or systems involvement: Secondary | ICD-10-CM | POA: Diagnosis not present

## 2017-01-10 DIAGNOSIS — M0589 Other rheumatoid arthritis with rheumatoid factor of multiple sites: Secondary | ICD-10-CM | POA: Diagnosis not present

## 2017-01-10 DIAGNOSIS — M549 Dorsalgia, unspecified: Secondary | ICD-10-CM | POA: Diagnosis not present

## 2017-02-11 DIAGNOSIS — M0589 Other rheumatoid arthritis with rheumatoid factor of multiple sites: Secondary | ICD-10-CM | POA: Diagnosis not present

## 2017-02-19 ENCOUNTER — Ambulatory Visit: Payer: No Typology Code available for payment source | Admitting: Family Medicine

## 2017-03-11 DIAGNOSIS — M0589 Other rheumatoid arthritis with rheumatoid factor of multiple sites: Secondary | ICD-10-CM | POA: Diagnosis not present

## 2017-03-25 DIAGNOSIS — R05 Cough: Secondary | ICD-10-CM | POA: Diagnosis not present

## 2017-03-25 DIAGNOSIS — M05742 Rheumatoid arthritis with rheumatoid factor of left hand without organ or systems involvement: Secondary | ICD-10-CM | POA: Diagnosis not present

## 2017-03-25 DIAGNOSIS — J019 Acute sinusitis, unspecified: Secondary | ICD-10-CM | POA: Diagnosis not present

## 2017-03-25 DIAGNOSIS — M05741 Rheumatoid arthritis with rheumatoid factor of right hand without organ or systems involvement: Secondary | ICD-10-CM | POA: Diagnosis not present

## 2017-04-02 ENCOUNTER — Ambulatory Visit: Payer: No Typology Code available for payment source | Admitting: Family Medicine

## 2017-04-04 DIAGNOSIS — M0589 Other rheumatoid arthritis with rheumatoid factor of multiple sites: Secondary | ICD-10-CM | POA: Diagnosis not present

## 2017-04-04 DIAGNOSIS — Z79899 Other long term (current) drug therapy: Secondary | ICD-10-CM | POA: Diagnosis not present

## 2017-04-04 DIAGNOSIS — M549 Dorsalgia, unspecified: Secondary | ICD-10-CM | POA: Diagnosis not present

## 2017-04-04 DIAGNOSIS — M150 Primary generalized (osteo)arthritis: Secondary | ICD-10-CM | POA: Diagnosis not present

## 2017-04-04 DIAGNOSIS — M7050 Other bursitis of knee, unspecified knee: Secondary | ICD-10-CM | POA: Diagnosis not present

## 2017-04-12 ENCOUNTER — Other Ambulatory Visit: Payer: Self-pay

## 2017-04-12 ENCOUNTER — Ambulatory Visit (INDEPENDENT_AMBULATORY_CARE_PROVIDER_SITE_OTHER): Payer: Medicare Other | Admitting: Family Medicine

## 2017-04-12 ENCOUNTER — Encounter: Payer: Self-pay | Admitting: Family Medicine

## 2017-04-12 VITALS — BP 130/90 | HR 90 | Temp 97.5°F | Wt 256.8 lb

## 2017-04-12 DIAGNOSIS — J0100 Acute maxillary sinusitis, unspecified: Secondary | ICD-10-CM

## 2017-04-12 DIAGNOSIS — I10 Essential (primary) hypertension: Secondary | ICD-10-CM

## 2017-04-12 DIAGNOSIS — R197 Diarrhea, unspecified: Secondary | ICD-10-CM

## 2017-04-12 DIAGNOSIS — M069 Rheumatoid arthritis, unspecified: Secondary | ICD-10-CM | POA: Diagnosis not present

## 2017-04-12 LAB — LIPID PANEL
Cholesterol: 144 mg/dL (ref 0–200)
HDL: 62 mg/dL (ref >39.00)
LDL CALC: 68 mg/dL (ref 0–99)
NONHDL: 82.47
TRIGLYCERIDES: 71 mg/dL (ref 0.0–149.0)
Total CHOL/HDL Ratio: 2
VLDL: 14.2 mg/dL (ref 0.0–40.0)

## 2017-04-12 LAB — COMPREHENSIVE METABOLIC PANEL
ALT: 11 U/L (ref 0–35)
AST: 12 U/L (ref 0–37)
Albumin: 3.9 g/dL (ref 3.5–5.2)
Alkaline Phosphatase: 51 U/L (ref 39–117)
BILIRUBIN TOTAL: 0.6 mg/dL (ref 0.2–1.2)
BUN: 12 mg/dL (ref 6–23)
CALCIUM: 9.8 mg/dL (ref 8.4–10.5)
CHLORIDE: 103 meq/L (ref 96–112)
CO2: 32 meq/L (ref 19–32)
Creatinine, Ser: 0.8 mg/dL (ref 0.40–1.20)
GFR: 76.19 mL/min (ref >60.00)
GLUCOSE: 94 mg/dL (ref 70–99)
Potassium: 3.8 mEq/L (ref 3.5–5.1)
Sodium: 142 mEq/L (ref 135–145)
Total Protein: 6.7 g/dL (ref 6.0–8.3)

## 2017-04-12 LAB — HEMOGLOBIN A1C: Hgb A1c MFr Bld: 6.1 % (ref 4.6–6.5)

## 2017-04-12 NOTE — Assessment & Plan Note (Signed)
Chronic issue.  Improves if she takes fiber.  She is going to follow-up with GI to get colonoscopy and lab work completed.

## 2017-04-12 NOTE — Progress Notes (Signed)
  [name], MD Phone: [phone]  Wanda Lang is a 67 y.o. female who presents today for f/u.  HYPERTENSION  Disease Monitoring  Home BP Monitoring not checking Chest pain- no    Dyspnea- no Medications  Compliance-  Taking HCTZ.   Edema- no  Recently treated for sinusitis at the walk-in clinic.  Treated with doxycycline.  She had cough, ear pain, headaches, and fevers.  She has improved significantly.  Minimal cough now that is improving.  No fevers.  She has had chronic diarrhea for many years.  She did see GI and they recommended lab evaluation as well as colonoscopy.  She has not set those things up.  She notes normal stools if she takes her fiber.  No blood in her stool.  Rheumatoid arthritis: Followed by rheumatology.  Currently on Orencia and Celebrex.  She did have some bursitis in her left knee at the pes anserine bursa.  She is treated with prednisone and that has improved.    Social History   Tobacco Use  Smoking Status Never Smoker  Smokeless Tobacco Never Used     ROS see history of present illness  Objective  Physical Exam Vitals:   04/12/17 1009  BP: 130/90  Pulse: 90  Temp: (!) 97.5 F (36.4 C)  SpO2: 96%    BP Readings from Last 3 Encounters:  04/12/17 130/90  11/23/16 134/89  11/09/16 130/80   Wt Readings from Last 3 Encounters:  04/12/17 256 lb 12.8 oz (116.5 kg)  11/23/16 258 lb 6.4 oz (117.2 kg)  11/09/16 260 lb 8 oz (118.2 kg)    Physical Exam  Constitutional: No distress.  Cardiovascular: Normal rate, regular rhythm and normal heart sounds.  Pulmonary/Chest: Effort normal and breath sounds normal.  Abdominal: Soft. Bowel sounds are normal. She exhibits no distension. There is no tenderness. There is no rebound and no guarding.  Musculoskeletal: She exhibits no edema.  No tenderness or swelling of left leg in the area of the pes anserine bursa  Neurological: She is alert. Gait normal.  Skin: Skin is warm and dry. She is  not diaphoretic.     Assessment/Plan: Please see individual problem list.  Sinusitis, acute Symptoms have been resolving.  She will monitor for worsening and recurrence.  DIARRHEA Chronic issue.  Improves if she takes fiber.  She is going to follow-up with GI to get colonoscopy and lab work completed.  Rheumatoid arthritis ([facility]) Followed by rheumatology. Bursitis resolved.   Hypertension She will start monitoring at home and return in 2 weeks with readings for nurse BP check. Continue current regimen.  Check lab work.   Orders Placed This Encounter  Procedures  . Comp Met (CMET)  . Lipid panel  . HgB A1c    No orders of the defined types were placed in this encounter.    [name], MD [place]

## 2017-04-12 NOTE — Assessment & Plan Note (Signed)
Symptoms have been resolving.  She will monitor for worsening and recurrence.

## 2017-04-12 NOTE — Assessment & Plan Note (Addendum)
She will start monitoring at home and return in 2 weeks with readings for nurse BP check. Continue current regimen.  Check lab work.

## 2017-04-12 NOTE — Patient Instructions (Signed)
Nice to see you. Please start checking her blood pressure at home.  We will have you return in 2 weeks for recheck. We will contact you with your lab results.

## 2017-04-12 NOTE — Assessment & Plan Note (Signed)
Followed by rheumatology. Bursitis resolved.

## 2017-04-18 DIAGNOSIS — M0589 Other rheumatoid arthritis with rheumatoid factor of multiple sites: Secondary | ICD-10-CM | POA: Diagnosis not present

## 2017-04-30 ENCOUNTER — Telehealth: Payer: Self-pay

## 2017-04-30 ENCOUNTER — Ambulatory Visit: Payer: No Typology Code available for payment source

## 2017-04-30 DIAGNOSIS — E876 Hypokalemia: Secondary | ICD-10-CM

## 2017-04-30 NOTE — Telephone Encounter (Signed)
Patient notified that we received lab results form [facility] and her potassium was low. Patient states she was told to take 3 x 99 mg of potassium OTC

## 2017-04-30 NOTE — Telephone Encounter (Signed)
Noted.  We should recheck this in the next week or so.  I have placed an order.  Thanks.

## 2017-05-01 NOTE — Telephone Encounter (Signed)
Patient scheduled.

## 2017-05-07 ENCOUNTER — Other Ambulatory Visit (INDEPENDENT_AMBULATORY_CARE_PROVIDER_SITE_OTHER): Payer: Medicare Other

## 2017-05-07 ENCOUNTER — Ambulatory Visit (INDEPENDENT_AMBULATORY_CARE_PROVIDER_SITE_OTHER): Payer: Medicare Other

## 2017-05-07 DIAGNOSIS — E876 Hypokalemia: Secondary | ICD-10-CM | POA: Diagnosis not present

## 2017-05-07 DIAGNOSIS — I10 Essential (primary) hypertension: Secondary | ICD-10-CM

## 2017-05-07 LAB — POTASSIUM: Potassium: 3.6 mEq/L (ref 3.5–5.1)

## 2017-05-07 NOTE — Progress Notes (Signed)
Patient comes in for blood pressure check .  She didn't bring in home blood pressure readings.   She states blood pressure readings averaging 136/89 she is complaint with HCTZ also taking Potassium 99 mcg daily.   She denies increased swelling or shortness of breath, chest pain.   Blood pressure left arm 136/90 right arm 140/88 pulse 86 O2 %97.  Please advise.

## 2017-05-09 NOTE — Progress Notes (Signed)
BP is not at goal. I would suggest adding amlodipine 5 mg daily to her regimen. If she is willing to do this please send in amlodipine 5 mg with the instructions take one tablet by mouth daily. Dispense 90 tablets with 3 refills. She will need a recheck of her BP in one month. Thanks.

## 2017-05-10 NOTE — Progress Notes (Signed)
Left message to return call 

## 2017-05-10 NOTE — Progress Notes (Signed)
Patient advised of below and she wants to hold off on adding amlodipine and do some research before starting on blood pressure medication at this time .   Advised her of blood pressure goal .  She will call back and let us know what she would like to do.

## 2017-05-16 DIAGNOSIS — M0589 Other rheumatoid arthritis with rheumatoid factor of multiple sites: Secondary | ICD-10-CM | POA: Diagnosis not present

## 2017-05-23 ENCOUNTER — Other Ambulatory Visit: Payer: Self-pay | Admitting: Family Medicine

## 2017-05-30 DIAGNOSIS — M549 Dorsalgia, unspecified: Secondary | ICD-10-CM | POA: Diagnosis not present

## 2017-05-30 DIAGNOSIS — M150 Primary generalized (osteo)arthritis: Secondary | ICD-10-CM | POA: Diagnosis not present

## 2017-05-30 DIAGNOSIS — M0589 Other rheumatoid arthritis with rheumatoid factor of multiple sites: Secondary | ICD-10-CM | POA: Diagnosis not present

## 2017-05-30 DIAGNOSIS — Z79899 Other long term (current) drug therapy: Secondary | ICD-10-CM | POA: Diagnosis not present

## 2017-05-30 DIAGNOSIS — M7050 Other bursitis of knee, unspecified knee: Secondary | ICD-10-CM | POA: Diagnosis not present

## 2017-06-06 DIAGNOSIS — M8588 Other specified disorders of bone density and structure, other site: Secondary | ICD-10-CM | POA: Diagnosis not present

## 2017-06-06 DIAGNOSIS — Z1231 Encounter for screening mammogram for malignant neoplasm of breast: Secondary | ICD-10-CM | POA: Diagnosis not present

## 2017-06-06 DIAGNOSIS — N958 Other specified menopausal and perimenopausal disorders: Secondary | ICD-10-CM | POA: Diagnosis not present

## 2017-06-06 DIAGNOSIS — Z01419 Encounter for gynecological examination (general) (routine) without abnormal findings: Secondary | ICD-10-CM | POA: Diagnosis not present

## 2017-06-06 DIAGNOSIS — Z6841 Body Mass Index (BMI) 40.0 and over, adult: Secondary | ICD-10-CM | POA: Diagnosis not present

## 2017-06-06 DIAGNOSIS — R351 Nocturia: Secondary | ICD-10-CM | POA: Diagnosis not present

## 2017-06-13 DIAGNOSIS — M0589 Other rheumatoid arthritis with rheumatoid factor of multiple sites: Secondary | ICD-10-CM | POA: Diagnosis not present

## 2017-07-11 DIAGNOSIS — M0589 Other rheumatoid arthritis with rheumatoid factor of multiple sites: Secondary | ICD-10-CM | POA: Diagnosis not present

## 2017-07-15 ENCOUNTER — Encounter: Payer: Self-pay | Admitting: Family Medicine

## 2017-07-15 ENCOUNTER — Ambulatory Visit (INDEPENDENT_AMBULATORY_CARE_PROVIDER_SITE_OTHER): Payer: Medicare Other | Admitting: Family Medicine

## 2017-07-15 VITALS — BP 124/82 | HR 87 | Temp 98.1°F | Ht 65.0 in | Wt 256.8 lb

## 2017-07-15 DIAGNOSIS — I10 Essential (primary) hypertension: Secondary | ICD-10-CM | POA: Diagnosis not present

## 2017-07-15 DIAGNOSIS — K219 Gastro-esophageal reflux disease without esophagitis: Secondary | ICD-10-CM | POA: Diagnosis not present

## 2017-07-15 DIAGNOSIS — L729 Follicular cyst of the skin and subcutaneous tissue, unspecified: Secondary | ICD-10-CM | POA: Diagnosis not present

## 2017-07-15 DIAGNOSIS — M7050 Other bursitis of knee, unspecified knee: Secondary | ICD-10-CM | POA: Diagnosis not present

## 2017-07-15 DIAGNOSIS — I6529 Occlusion and stenosis of unspecified carotid artery: Secondary | ICD-10-CM

## 2017-07-15 DIAGNOSIS — K760 Fatty (change of) liver, not elsewhere classified: Secondary | ICD-10-CM | POA: Diagnosis not present

## 2017-07-15 NOTE — Progress Notes (Signed)
[name], MD Phone: [phone]  Wanda Lang is a 67 y.o. female who presents today for f/u.  CC: htn, fatty liver  HYPERTENSION  Disease Monitoring  Home BP Monitoring similar to today Chest pain- no    Dyspnea- no Medications  Compliance-  Taking HCTZ 25 mg daily for the past year. Lightheadedness-  no  Edema- chronic and stable, no orthopnea, no PND  Fatty liver: She notes no right upper quadrant pain.  Diet includes a lot of vegetables.  Does have trail mix.  Is trying to get back to exercising.  Bursitis: She has been seeing her rheumatologist for pes anserine bursitis.  This has improved quite a bit.  It was in her left knee.  She has been icing it.  She is going to possibly do physical therapy.  Reflux: She reports some reflux in the past.  Occasionally she will get an acidy and gassy sensation in her epigastric region if she has too many tomatoes.  No blood in her stool.  She needs to see GI for a colonoscopy and possible EGD.  Carotid artery disease: She reports she had screening about 10 years ago for carotid artery disease and was found to have mild plaque and stenosis.  She wonders about getting repeat screening.  She has an age spot with a mole on her left forearm.  She saw dermatology and they noted it was an age spot with an open cyst and nothing to worry about.    Social History   Tobacco Use  Smoking Status Never Smoker  Smokeless Tobacco Never Used     ROS see history of present illness  Objective  Physical Exam Vitals:   07/15/17 1002  BP: 124/82  Pulse: 87  Temp: 98.1 F (36.7 C)  SpO2: 95%    BP Readings from Last 3 Encounters:  07/15/17 124/82  04/12/17 130/90  11/23/16 134/89   Wt Readings from Last 3 Encounters:  07/15/17 256 lb 12.8 oz (116.5 kg)  04/12/17 256 lb 12.8 oz (116.5 kg)  11/23/16 258 lb 6.4 oz (117.2 kg)    Physical Exam  Constitutional: No distress.  Cardiovascular: Normal rate, regular rhythm and normal  heart sounds.  No carotid bruits  Pulmonary/Chest: Effort normal and breath sounds normal.  Abdominal: Soft. Bowel sounds are normal. She exhibits no distension. There is no tenderness.  Musculoskeletal: She exhibits no edema.  Slight tenderness at the anserine bursa of the left knee with no overlying skin changes, swelling, or palpable abnormalities  Neurological: She is alert.  Skin: Skin is warm and dry. She is not diaphoretic.  Left ulnar forearm with seborrheic keratosis and cyst centrally located in the seborrheic keratosis   Assessment/Plan: Please see individual problem list.  Hypertension Well-controlled.  Continue current regimen.  Fatty liver She will work on diet and exercise.  Pes anserine bursitis Improving.  She will continue to monitor.  Acid reflux Rare symptoms.  Discussed that she needs to see GI as they planned on a colonoscopy and she likely needs an EGD.  She was given the number to contact them to set up an appointment as she has already seen them and been referred.  Asymptomatic carotid artery stenosis Needs follow-up ultrasound.  Cyst of skin Patient saw dermatology for this.  No intervention done per patient.   Orders Placed This Encounter  Procedures  . US Carotid Duplex Bilateral    Standing Status:   Future    Standing Expiration Date:   09/15/2018  Order Specific Question:   Reason for exam:    Answer:   history of mild carotid stenosis    Order Specific Question:   Preferred imaging location?    Answer:   [place]    No orders of the defined types were placed in this encounter.    [name], MD [place]

## 2017-07-15 NOTE — Assessment & Plan Note (Signed)
Needs follow-up ultrasound.

## 2017-07-15 NOTE — Assessment & Plan Note (Signed)
Patient saw dermatology for this.  No intervention done per patient.

## 2017-07-15 NOTE — Assessment & Plan Note (Signed)
Improving.  She will continue to monitor. 

## 2017-07-15 NOTE — Assessment & Plan Note (Signed)
She will work on diet and exercise.

## 2017-07-15 NOTE — Patient Instructions (Signed)
Nice to see you. Please contact GI at the following number to set up colonoscopy and likely EGD. [phone]  we will get you set up for an ultrasound of your neck to evaluate your carotid arteries. Please try to work on dietary changes and increasing her activity level.

## 2017-07-15 NOTE — Assessment & Plan Note (Signed)
Rare symptoms.  Discussed that she needs to see GI as they planned on a colonoscopy and she likely needs an EGD.  She was given the number to contact them to set up an appointment as she has already seen them and been referred.

## 2017-07-15 NOTE — Assessment & Plan Note (Signed)
Well-controlled.  Continue current regimen. 

## 2017-08-09 DIAGNOSIS — M0589 Other rheumatoid arthritis with rheumatoid factor of multiple sites: Secondary | ICD-10-CM | POA: Diagnosis not present

## 2017-08-09 DIAGNOSIS — M069 Rheumatoid arthritis, unspecified: Secondary | ICD-10-CM | POA: Diagnosis not present

## 2017-08-13 ENCOUNTER — Telehealth: Payer: Self-pay | Admitting: Family Medicine

## 2017-08-13 DIAGNOSIS — K760 Fatty (change of) liver, not elsewhere classified: Secondary | ICD-10-CM

## 2017-08-13 NOTE — Telephone Encounter (Signed)
Left message to return call, ok for pec to speak to patient and also schedule non fasting lab appointment for hep A titer

## 2017-08-13 NOTE — Telephone Encounter (Signed)
Please let the patient know that I received lab work from her rheumatologist.  It appears that she does not have immunity to hepatitis B.  Based on her history of fatty liver she would benefit from immunization against this.  We should also check and see if she is immune against hepatitis A.  If not we can give both at the same time.  If she is willing to be checked for hepatitis A immunity I can place an order.  Thanks.

## 2017-08-14 NOTE — Telephone Encounter (Signed)
Ordered

## 2017-08-14 NOTE — Addendum Note (Signed)
Addended by: Leone Haven on: 08/14/2017 12:36 PM   Modules accepted: Orders

## 2017-08-14 NOTE — Telephone Encounter (Signed)
Additional labs received. They checked a Hep A IgM, though she needs a Hep A IgG to determine prior exposure to hep A. Please get her set up for this and then I will place an order.

## 2017-08-14 NOTE — Telephone Encounter (Signed)
Placed lab results in blue folder

## 2017-08-14 NOTE — Telephone Encounter (Signed)
Patient notified and is scheduled for lab please place orders

## 2017-08-16 ENCOUNTER — Other Ambulatory Visit (INDEPENDENT_AMBULATORY_CARE_PROVIDER_SITE_OTHER): Payer: Medicare Other

## 2017-08-16 DIAGNOSIS — K760 Fatty (change of) liver, not elsewhere classified: Secondary | ICD-10-CM

## 2017-08-17 LAB — HEPATITIS A ANTIBODY, TOTAL: HEPATITIS A AB,TOTAL: NONREACTIVE

## 2017-08-22 ENCOUNTER — Ambulatory Visit (INDEPENDENT_AMBULATORY_CARE_PROVIDER_SITE_OTHER): Payer: Medicare Other | Admitting: *Deleted

## 2017-08-22 DIAGNOSIS — Z23 Encounter for immunization: Secondary | ICD-10-CM

## 2017-08-29 DIAGNOSIS — M549 Dorsalgia, unspecified: Secondary | ICD-10-CM | POA: Diagnosis not present

## 2017-08-29 DIAGNOSIS — M7050 Other bursitis of knee, unspecified knee: Secondary | ICD-10-CM | POA: Diagnosis not present

## 2017-08-29 DIAGNOSIS — Z79899 Other long term (current) drug therapy: Secondary | ICD-10-CM | POA: Diagnosis not present

## 2017-08-29 DIAGNOSIS — M25562 Pain in left knee: Secondary | ICD-10-CM | POA: Diagnosis not present

## 2017-08-29 DIAGNOSIS — M150 Primary generalized (osteo)arthritis: Secondary | ICD-10-CM | POA: Diagnosis not present

## 2017-08-29 DIAGNOSIS — M0589 Other rheumatoid arthritis with rheumatoid factor of multiple sites: Secondary | ICD-10-CM | POA: Diagnosis not present

## 2017-09-06 DIAGNOSIS — M0589 Other rheumatoid arthritis with rheumatoid factor of multiple sites: Secondary | ICD-10-CM | POA: Diagnosis not present

## 2017-09-20 ENCOUNTER — Encounter: Payer: Self-pay | Admitting: Family Medicine

## 2017-09-20 ENCOUNTER — Ambulatory Visit (INDEPENDENT_AMBULATORY_CARE_PROVIDER_SITE_OTHER): Payer: Medicare Other | Admitting: Family Medicine

## 2017-09-20 VITALS — BP 136/84 | HR 103 | Temp 98.6°F | Ht 65.0 in | Wt 253.8 lb

## 2017-09-20 DIAGNOSIS — R0982 Postnasal drip: Secondary | ICD-10-CM | POA: Diagnosis not present

## 2017-09-20 DIAGNOSIS — R300 Dysuria: Secondary | ICD-10-CM | POA: Diagnosis not present

## 2017-09-20 DIAGNOSIS — H6693 Otitis media, unspecified, bilateral: Secondary | ICD-10-CM

## 2017-09-20 DIAGNOSIS — R0981 Nasal congestion: Secondary | ICD-10-CM

## 2017-09-20 LAB — POCT URINALYSIS DIP (MANUAL ENTRY)
BILIRUBIN UA: NEGATIVE
Glucose, UA: NEGATIVE mg/dL
Ketones, POC UA: NEGATIVE mg/dL
LEUKOCYTES UA: NEGATIVE
NITRITE UA: NEGATIVE
PROTEIN UA: NEGATIVE mg/dL
Spec Grav, UA: 1.025 (ref 1.010–1.025)
Urobilinogen, UA: 0.2 E.U./dL
pH, UA: 5.5 (ref 5.0–8.0)

## 2017-09-20 MED ORDER — FLUTICASONE PROPIONATE 50 MCG/ACT NA SUSP: 2.0000 | Freq: Every day | NASAL | 6 refills | Status: DC

## 2017-09-20 MED ORDER — DOXYCYCLINE HYCLATE 100 MG PO TABS: 100.0000 mg | ORAL_TABLET | Freq: Two times a day (BID) | ORAL | 0 refills | Status: DC

## 2017-09-20 NOTE — Progress Notes (Signed)
   Subjective:    Patient ID: Wanda Lang, female    DOB: [birth year], 67 y.o.   MRN: [id number]  HPI   Patient presents to clinic due to sinus pressure congestion, ear pain for the past week.  She usually does saline nasal flushes pretty regularly but has not been doing any recently.  States she can feel thick postnasal drip that makes her cough every morning.  Feels as if she has had no energy this past week.  Also reports some painful urination for past 1 to 2 days.  Denies fever or chills. Denies nausea, vomiting or diarrhea.    Patient Active Problem List   Diagnosis Date Noted  . Pes anserine bursitis 07/15/2017  . Acid reflux 07/15/2017  . Asymptomatic carotid artery stenosis 07/15/2017  . Cyst of skin 07/15/2017  . Immunocompromised ([place]) 11/09/2016  . Allergic rhinitis 11/02/2016  . Tremor 11/02/2016  . Hypertension 11/02/2016  . Postmenopausal symptoms 07/01/2015  . Rheumatoid arthritis involving both hands with positive rheumatoid factor ([place]) 07/01/2015  . Seasonal allergic rhinitis due to pollen 07/01/2015  . Fatty liver 09/08/2007  . Rheumatoid arthritis ([place]) 09/08/2007  . DIARRHEA 09/08/2007   Social History   Tobacco Use  . Smoking status: Never Smoker  . Smokeless tobacco: Never Used  Substance Use Topics  . Alcohol use: Yes    Alcohol/week: 1.0 standard drinks    Types: 1 Glasses of wine per week   Review of Systems   Constitutional: Negative for chills, fatigue and fever.  HENT: Positive for sinus congestion/pain, ear pain, thick nasal drainage. Eyes: Negative.   Respiratory: Negative for cough, shortness of breath and wheezing.   Cardiovascular: Negative for chest pain, palpitations and leg swelling.  Gastrointestinal: Negative for abdominal pain, diarrhea, nausea and vomiting.  Genitourinary: Positive for dysuria, mild Musculoskeletal: Negative for arthralgias and myalgias.  Skin: Negative for color change, pallor and rash.  Neurological:  Negative for syncope, light-headedness and headaches.  Psychiatric/Behavioral: The patient is not nervous/anxious.    Objective:   Physical Exam  Constitutional: She appears well-developed and well-nourished. No distress.  Head: Normocephalic and atraumatic.  Eyes: EOM are normal. No scleral icterus.  Nose/throat: Thick post nasal drip down back of throat. Ears: Redness, bulging bilateral TMs Cardiovascular: Normal rate, regular rhythm and normal heart sounds.  Pulmonary/Chest: Effort normal and breath sounds normal. No respiratory distress.   Abdominal: Soft. Bowel sounds are normal. There is no tenderness.  Neurological: She is alert and oriented to person, place, and time. Gait normal.  Skin: Skin is warm and dry. No pallor.  Psychiatric: She has a normal mood and affect. Her behavior is normal. Thought content normal.   Nursing note and vitals reviewed.  Vitals:   09/20/17 1630  BP: 136/84  Pulse: (!) 103  Temp: 98.6 F (37 C)  SpO2: 92%      Assessment & Plan:   Ear infection -- Doxycycline 100mg  BID for 10 days. PCN and sulfa allergic.  Nasal congestion/post nasal drip -- Flonase nasal spray and can take OTC Claritin to help nasal congestion.  Dysuria -- UA is unremarkable, culture collected and sent to lab. Increase water intake.  Avoid excess sugary caffeinated/alcohol beverages as these can make dysuria.  Keep appt in December 2019 as scheduled

## 2017-09-20 NOTE — Patient Instructions (Signed)
Great tot meet you!

## 2017-09-21 LAB — URINE CULTURE
MICRO NUMBER: 90977232
SPECIMEN QUALITY: ADEQUATE

## 2017-09-24 ENCOUNTER — Ambulatory Visit (INDEPENDENT_AMBULATORY_CARE_PROVIDER_SITE_OTHER): Payer: Medicare Other

## 2017-09-24 DIAGNOSIS — Z23 Encounter for immunization: Secondary | ICD-10-CM | POA: Diagnosis not present

## 2017-09-24 NOTE — Progress Notes (Signed)
Patient presented for Twinrix injection to left/right deltoid, patient voiced no concerns nor showed any signs of distress during injection. 

## 2017-10-09 ENCOUNTER — Ambulatory Visit (INDEPENDENT_AMBULATORY_CARE_PROVIDER_SITE_OTHER): Payer: Medicare Other | Admitting: Family Medicine

## 2017-10-09 ENCOUNTER — Encounter: Payer: Self-pay | Admitting: Family Medicine

## 2017-10-09 VITALS — BP 120/80 | HR 96 | Temp 98.7°F | Resp 17 | Ht 65.0 in | Wt 249.4 lb

## 2017-10-09 DIAGNOSIS — I6529 Occlusion and stenosis of unspecified carotid artery: Secondary | ICD-10-CM | POA: Diagnosis not present

## 2017-10-09 DIAGNOSIS — K529 Noninfective gastroenteritis and colitis, unspecified: Secondary | ICD-10-CM | POA: Insufficient documentation

## 2017-10-09 DIAGNOSIS — J0101 Acute recurrent maxillary sinusitis: Secondary | ICD-10-CM | POA: Diagnosis not present

## 2017-10-09 MED ORDER — LEVOFLOXACIN 500 MG PO TABS: 500.0000 mg | ORAL_TABLET | Freq: Every day | ORAL | 0 refills | Status: DC

## 2017-10-09 NOTE — Assessment & Plan Note (Signed)
Has improved with apple cider vinegar may be due for colonoscopy.  We will refer her back to GI as she did not see them previously.

## 2017-10-09 NOTE — Assessment & Plan Note (Signed)
Symptoms seem consistent with recurrent sinusitis.  She has a penicillin allergy.  She had 10 days of doxycycline and yet symptoms recurred.  We will treat with Levaquin.  Discussed potential risk for neurologic, neuropathy, and tendon rupture issues with Levaquin use.  If not improving she will follow-up.

## 2017-10-09 NOTE — Progress Notes (Signed)
  [name], MD Phone: [phone]  Wanda Lang is a 67 y.o. female who presents today for f/u.  CC: sinusitis, chronic diarrhea  Sinusitis: Patient previously seen for sinusitis by FNP.  Treated with doxycycline.  Notes she initially improved though worsened once antibiotics were completed.  She took 10 days of doxycycline.  She notes congestion in her frontal and maxillary sinuses with frontal headache.  No cough.  She has significant postnasal drip and hoarseness.  T-max 99.5 F.  Left ear hurts more than right ear.  Is not able to get much out of her nose.  She has been using decongestants.  Chronic diarrhea: Patient notes she did apple cider vinegar and this improved significantly.  It looks like she might be due for colonoscopy though she reports possibly having had one 3 years ago.  She never saw GI.  Social History   Tobacco Use  Smoking Status Never Smoker  Smokeless Tobacco Never Used     ROS see history of present illness  Objective  Physical Exam Vitals:   10/09/17 1448  BP: 120/80  Pulse: 96  Resp: 17  Temp: 98.7 F (37.1 C)  SpO2: 97%    BP Readings from Last 3 Encounters:  10/09/17 120/80  09/20/17 136/84  07/15/17 124/82   Wt Readings from Last 3 Encounters:  10/09/17 249 lb 6 oz (113.1 kg)  09/20/17 253 lb 12.8 oz (115.1 kg)  07/15/17 256 lb 12.8 oz (116.5 kg)    Physical Exam  Constitutional: No distress.  HENT:  Head: Normocephalic and atraumatic.  Right Ear: Tympanic membrane and ear canal normal.  Left Ear: Tympanic membrane and ear canal normal.  Mouth/Throat: Oropharynx is clear and moist. No oropharyngeal exudate.  Bilateral frontal and maxillary sinuses tender to percussion  Eyes: Pupils are equal, round, and reactive to light. Conjunctivae are normal.  Neck: Neck supple.  Cardiovascular: Normal rate, regular rhythm and normal heart sounds.  Pulmonary/Chest: Effort normal and breath sounds normal.  Musculoskeletal: She  exhibits no edema.  Lymphadenopathy:    She has no cervical adenopathy.  Neurological: She is alert.  Skin: Skin is warm and dry. She is not diaphoretic.     Assessment/Plan: Please see individual problem list.  Sinusitis Symptoms seem consistent with recurrent sinusitis.  She has a penicillin allergy.  She had 10 days of doxycycline and yet symptoms recurred.  We will treat with Levaquin.  Discussed potential risk for neurologic, neuropathy, and tendon rupture issues with Levaquin use.  If not improving she will follow-up.  Chronic diarrhea Has improved with apple cider vinegar may be due for colonoscopy.  We will refer her back to GI as she did not see them previously.    Orders Placed This Encounter  Procedures  . Ambulatory referral to Gastroenterology    Referral Priority:   Routine    Referral Type:   Consultation    Referral Reason:   Specialty Services Required    Number of Visits Requested:   1    Meds ordered this encounter  Medications  . levofloxacin (LEVAQUIN) 500 MG tablet    Sig: Take 1 tablet (500 mg total) by mouth daily.    Dispense:  7 tablet    Refill:  0     [name], MD [place]

## 2017-10-09 NOTE — Patient Instructions (Signed)
Nice to see you. We will treat you with Levaquin for your sinus infection.  If your symptoms do not improve with this please let us know.  You should start to improve the next several days. If you develop fever or worsening symptoms please be reevaluated.

## 2017-10-23 DIAGNOSIS — J320 Chronic maxillary sinusitis: Secondary | ICD-10-CM | POA: Diagnosis not present

## 2017-10-23 DIAGNOSIS — J329 Chronic sinusitis, unspecified: Secondary | ICD-10-CM | POA: Diagnosis not present

## 2017-10-24 DIAGNOSIS — J320 Chronic maxillary sinusitis: Secondary | ICD-10-CM | POA: Insufficient documentation

## 2017-11-20 ENCOUNTER — Ambulatory Visit: Payer: Medicare Other | Admitting: Gastroenterology

## 2017-12-04 DIAGNOSIS — J320 Chronic maxillary sinusitis: Secondary | ICD-10-CM | POA: Diagnosis not present

## 2017-12-25 ENCOUNTER — Encounter: Payer: Self-pay | Admitting: Gastroenterology

## 2017-12-25 ENCOUNTER — Ambulatory Visit (INDEPENDENT_AMBULATORY_CARE_PROVIDER_SITE_OTHER): Payer: Medicare Other | Admitting: Gastroenterology

## 2017-12-25 VITALS — BP 127/76 | HR 81 | Ht 65.0 in | Wt 257.8 lb

## 2017-12-25 DIAGNOSIS — R197 Diarrhea, unspecified: Secondary | ICD-10-CM | POA: Diagnosis not present

## 2017-12-26 NOTE — Progress Notes (Signed)
Wanda Lang [address]  [place], [place] [address]  Main: [phone]  Fax: [phone]   Gastroenterology Consultation  Referring Provider:     Leone Haven, MD Primary Care Physician:  Wanda Haven, MD Primary Gastroenterologist:  Dr. Vonda Lang Reason for Consultation:     Chronic diarrhea        HPI:    Chief Complaint  Patient presents with  . New Patient (Initial Visit)    referral by Dr. Biagio Quint for Chronic Diarrhea (pt saw Dr. Marius Lang 6yr ago)    Wanda Lang is a 67 y.o. y/o female referred for consultation & management  by Dr. Caryl Lang, Wanda Adam, MD.  Patient with history of chronic loose stools that she has managed with various fiber products.  States she has 1-3 loose bowel movements a day without blood.  This has not changed in years.  She has history of rheumatoid arthritis and attributes some of her symptoms to when she started some of her medications.  Is currently on Orencia for rheumatoid arthritis, but states she thinks her diarrhea started after she was started on Viox along with her RA medications.  However, when the Vioxx was changed to Celebrex she did not notice a change in her symptoms.  She started taking a fiber supplement called calcium polycarbophil with every meal and that significantly controlled her diarrhea.  Over the last week she has stopped the calcium polycarbophil and started using apple cider vinegar and this has significantly reduced her diarrhea and she states over the last 2 to 3 days she has not had any loose bowel movements.  Denies any urinary or fecal incontinence.  Denies any nocturnal diarrhea.  She saw Dr. Marius Lang for similar symptoms 1 year ago and at that time colonoscopy was recommended but has not been done yet.  As per Dr. Verlin Lang note: GI Procedures:  Colonoscopy 03/10/2002 normal exam, no polyps no biopsies were performed EGD and Colonoscopy 09/23/2009 with [place] to have hyperplastic polyps in stomach   Past Medical History:  Diagnosis Date  . Allergy   . Arthritis   . Chronic kidney disease   . GERD (gastroesophageal reflux disease)   . Hypertension   . LIVER FUNCTION TESTS, ABNORMAL, HX OF 09/08/2007   Qualifier: Diagnosis of  By: Wanda Lang CMA (AAMA), Dottie      Past Surgical History:  Procedure Laterality Date  . ABDOMINAL HYSTERECTOMY    . LAPAROSCOPY      Prior to Admission medications   Medication Sig Start Date End Date Taking? Authorizing Provider  Abatacept (ORENCIA) 125 MG/ML SOSY Inject into the vein.   Yes [provider]  acetaminophen (TYLENOL) 500 MG tablet Take 500 mg by mouth as needed.   Yes [provider]  calcium carbonate 1250 MG capsule Take by mouth.   Yes [provider]  Coenzyme Q10 (COQ10) 200 MG CAPS Take 200 mg by mouth daily.   Yes [provider]  Collagen 500 MG CAPS Take by mouth.   Yes [provider]  cyclobenzaprine (FLEXERIL) 5 MG tablet Take 5 mg by mouth as needed.   Yes [provider]  cycloSPORINE (RESTASIS) 0.05 % ophthalmic emulsion Apply to eye.   Yes [provider]  Estradiol (VAGIFEM) 10 MCG TABS vaginal tablet Place vaginally.   Yes [provider]  estradiol (VIVELLE-DOT) 0.05 MG/24HR patch Place 1 patch onto the skin 2 (two) times a week.  Yes [provider]  Flaxseed, Linseed, (FLAXSEED OIL) 1000 MG CAPS Take by mouth.   Yes [provider]  fluticasone (FLONASE) 50 MCG/ACT nasal spray Place 2 sprays into both nostrils daily. 09/20/17  Yes Guse, Jacquelynn Cree, FNP  Glucosamine-Chondroitin 250-200 MG CAPS Take by mouth.   Yes [provider]  Magnesium 100 MG CAPS Take by mouth.   Yes [provider]  Potassium 99 MG TABS Take 99 mg by mouth 2 (two) times daily.   Yes [provider]  Vitamin D, Ergocalciferol, (DRISDOL) 50000 units CAPS capsule Take 50,000  Units by mouth every 14 (fourteen) days.   Yes [provider]  ZINC MT Use as directed in the mouth or throat.   Yes [provider]  celecoxib (CELEBREX) 200 MG capsule Take 200 mg by mouth daily.    [provider]  levofloxacin (LEVAQUIN) 500 MG tablet Take 1 tablet (500 mg total) by mouth daily. Patient not taking: Reported on 12/25/2017 10/09/17   Wanda Haven, MD  Loratadine 10 MG CAPS Take 10 mg by mouth as needed.    [provider]    Family History  Problem Relation Age of Onset  . Alcohol abuse Mother   . Arthritis Mother   . Hyperlipidemia Mother   . Heart disease Mother   . Stroke Mother   . Hypertension Mother   . Depression Mother   . Anxiety disorder Mother   . Arthritis Father   . Lung cancer Paternal Grandfather   . Kidney disease Paternal Grandfather      Social History   Tobacco Use  . Smoking status: Never Smoker  . Smokeless tobacco: Never Used  Substance Use Topics  . Alcohol use: Yes    Alcohol/week: 1.0 standard drinks    Types: 1 Glasses of wine per week  . Drug use: No    Allergies as of 12/25/2017 - Review Complete 12/25/2017  Allergen Reaction Noted  . Amoxicillin  09/08/2007  . Penicillins Itching 09/10/2007  . Sulfonamide derivatives  09/08/2007  . Ylang-ylang [cananga oil (ylang-ylang)] Itching 11/23/2016    Review of Systems:    All systems reviewed and negative except where noted in HPI.   Physical Exam:  BP 127/76   Pulse 81   Ht 5\' 5"  (1.651 m)   Wt 257 lb 12.8 oz (116.9 kg)   BMI 42.90 kg/m  No LMP recorded. Patient is postmenopausal. Psych:  Alert and cooperative. Normal mood and affect. General:   Alert,  Well-developed, well-nourished, pleasant and cooperative in NAD Head:  Normocephalic and atraumatic. Eyes:  Sclera clear, no icterus.   Conjunctiva pink. Ears:  Normal auditory acuity. Nose:  No deformity, discharge, or lesions. Mouth:  No deformity or lesions,oropharynx pink &  moist. Neck:  Supple; no masses or thyromegaly. Abdomen:  Normal bowel sounds.  No bruits.  Soft, non-tender and non-distended without masses, hepatosplenomegaly or hernias noted.  No guarding or rebound tenderness.    Msk:  Symmetrical without gross deformities. Good, equal movement & strength bilaterally. Pulses:  Normal pulses noted. Extremities:  No clubbing or edema.  No cyanosis. Neurologic:  Alert and oriented x3;  grossly normal neurologically. Skin:  Intact without significant lesions or rashes. No jaundice. Lymph Nodes:  No significant cervical adenopathy. Psych:  Alert and cooperative. Normal mood and affect.   Labs: CBC No results found for: WBC, RBC, HGB, HCT, PLT, MCV, MCH, MCHC, RDW, LYMPHSABS, MONOABS, EOSABS, BASOSABS CMP  Component Value Date/Time   NA 142 04/12/2017 1026   K 3.6 05/07/2017 0907   CL 103 04/12/2017 1026   CO2 32 04/12/2017 1026   GLUCOSE 94 04/12/2017 1026   BUN 12 04/12/2017 1026   CREATININE 0.80 04/12/2017 1026   CALCIUM 9.8 04/12/2017 1026   PROT 6.7 04/12/2017 1026   ALBUMIN 3.9 04/12/2017 1026   AST 12 04/12/2017 1026   ALT 11 04/12/2017 1026   ALKPHOS 51 04/12/2017 1026   BILITOT 0.6 04/12/2017 1026    Imaging Studies: No results found.  Assessment and Plan:   MAKAYA JUNEAU is a 67 y.o. y/o female has been referred for history of chronic diarrhea, managed well with fiber supplements and currently apple cider vinegar  No alarm symptoms present Patient attributes some of her symptoms to changes in medications over the years Currently diarrhea has completely resolved with apple cider vinegar She has never had colon biopsies to rule out microscopic colitis Doubt infectious causes given chronic symptoms Doubt fat malabsorption given stable weight, and patient denies any diarrhea or diarrhea  We will start with colonoscopy to rule out microscopic colitis and any underlying inflammation  Given her history of hyperplastic polyps  that were removed in 2011 and follow-up EGD was recommended in 1 year after that and has not been done yet, would also recommend EGD for reevaluation to see if polyps are still present and if they are large if they need to be removed  I have discussed alternative options, risks & benefits,  which include, but are not limited to, bleeding, infection, perforation,respiratory complication & drug reaction.  The patient agrees with this plan & written consent will be obtained.    Fatty liver noted in 2009 CT scan Recent liver enzymes from July 2019 are all normal, see scanned report No clinical evidence of cirrhosis Patient was immunized for hepatitis A and B by her primary care provider and is currently undergoing the three-step series Hep C antibody was nonreactive in July 2019 as well Finding of fatty liver on imaging discussed with patient Diet, weight loss, and exercise encouraged along with avoiding hepatotoxic drugs including alcohol Risk of progression to cirrhosis if above measures are not instituted were discussed as well, and patient verbalized understanding   Dr Wanda Lang  Speech recognition software was used to dictate the above note.

## 2017-12-27 ENCOUNTER — Other Ambulatory Visit: Payer: Self-pay

## 2017-12-27 DIAGNOSIS — Z1211 Encounter for screening for malignant neoplasm of colon: Secondary | ICD-10-CM

## 2017-12-27 DIAGNOSIS — R197 Diarrhea, unspecified: Secondary | ICD-10-CM

## 2018-01-14 ENCOUNTER — Ambulatory Visit: Payer: Medicare Other

## 2018-01-14 ENCOUNTER — Ambulatory Visit: Payer: Medicare Other | Admitting: Family Medicine

## 2018-01-16 DIAGNOSIS — M0589 Other rheumatoid arthritis with rheumatoid factor of multiple sites: Secondary | ICD-10-CM | POA: Diagnosis not present

## 2018-01-16 DIAGNOSIS — M150 Primary generalized (osteo)arthritis: Secondary | ICD-10-CM | POA: Diagnosis not present

## 2018-01-16 DIAGNOSIS — M7050 Other bursitis of knee, unspecified knee: Secondary | ICD-10-CM | POA: Diagnosis not present

## 2018-01-16 DIAGNOSIS — Z23 Encounter for immunization: Secondary | ICD-10-CM | POA: Diagnosis not present

## 2018-01-16 DIAGNOSIS — M549 Dorsalgia, unspecified: Secondary | ICD-10-CM | POA: Diagnosis not present

## 2018-01-16 DIAGNOSIS — Z79899 Other long term (current) drug therapy: Secondary | ICD-10-CM | POA: Diagnosis not present

## 2018-01-27 DIAGNOSIS — J320 Chronic maxillary sinusitis: Secondary | ICD-10-CM | POA: Diagnosis not present

## 2018-02-03 ENCOUNTER — Other Ambulatory Visit
Admission: RE | Admit: 2018-02-03 | Discharge: 2018-02-03 | Disposition: A | Discharge status: Home / Self Care | Payer: Medicare Other | Attending: Family Medicine | Admitting: Family Medicine

## 2018-02-03 ENCOUNTER — Telehealth: Payer: Self-pay | Admitting: Family Medicine

## 2018-02-03 ENCOUNTER — Encounter: Payer: Self-pay | Admitting: Family Medicine

## 2018-02-03 ENCOUNTER — Ambulatory Visit: Payer: Medicare Other

## 2018-02-03 ENCOUNTER — Ambulatory Visit (INDEPENDENT_AMBULATORY_CARE_PROVIDER_SITE_OTHER): Payer: Medicare Other | Admitting: Family Medicine

## 2018-02-03 VITALS — BP 120/78 | HR 71 | Temp 97.9°F | Ht 65.0 in | Wt 254.8 lb

## 2018-02-03 DIAGNOSIS — R011 Cardiac murmur, unspecified: Secondary | ICD-10-CM

## 2018-02-03 DIAGNOSIS — J0101 Acute recurrent maxillary sinusitis: Secondary | ICD-10-CM

## 2018-02-03 DIAGNOSIS — R509 Fever, unspecified: Secondary | ICD-10-CM

## 2018-02-03 DIAGNOSIS — K529 Noninfective gastroenteritis and colitis, unspecified: Secondary | ICD-10-CM

## 2018-02-03 DIAGNOSIS — R7989 Other specified abnormal findings of blood chemistry: Secondary | ICD-10-CM

## 2018-02-03 DIAGNOSIS — M069 Rheumatoid arthritis, unspecified: Secondary | ICD-10-CM

## 2018-02-03 DIAGNOSIS — I6529 Occlusion and stenosis of unspecified carotid artery: Secondary | ICD-10-CM

## 2018-02-03 DIAGNOSIS — R945 Abnormal results of liver function studies: Principal | ICD-10-CM

## 2018-02-03 LAB — COMPREHENSIVE METABOLIC PANEL
ALT: 267 U/L — ABNORMAL HIGH (ref 0–44)
AST: 209 U/L — ABNORMAL HIGH (ref 15–41)
Albumin: 3.3 g/dL — ABNORMAL LOW (ref 3.5–5.0)
Alkaline Phosphatase: 182 U/L — ABNORMAL HIGH (ref 38–126)
Anion gap: 8 (ref 5–15)
BUN: 21 mg/dL (ref 8–23)
CHLORIDE: 104 mmol/L (ref 98–111)
CO2: 28 mmol/L (ref 22–32)
Calcium: 9 mg/dL (ref 8.9–10.3)
Creatinine, Ser: 0.93 mg/dL (ref 0.44–1.00)
GFR calc Af Amer: >60 mL/min (ref >60)
GFR calc non Af Amer: >60 mL/min (ref >60)
Glucose, Bld: 113 mg/dL — ABNORMAL HIGH (ref 70–99)
Potassium: 4.1 mmol/L (ref 3.5–5.1)
Sodium: 140 mmol/L (ref 135–145)
Total Bilirubin: 1.2 mg/dL (ref 0.3–1.2)
Total Protein: 7 g/dL (ref 6.5–8.1)

## 2018-02-03 LAB — CBC
HCT: 44.3 % (ref 36.0–46.0)
HEMOGLOBIN: 14.3 g/dL (ref 12.0–15.0)
MCH: 30.8 pg (ref 26.0–34.0)
MCHC: 32.3 g/dL (ref 30.0–36.0)
MCV: 95.3 fL (ref 80.0–100.0)
Platelets: 200 10*3/uL (ref 150–400)
RBC: 4.65 MIL/uL (ref 3.87–5.11)
RDW: 14.4 % (ref 11.5–15.5)
WBC: 7.6 10*3/uL (ref 4.0–10.5)
nRBC: 0 % (ref 0.0–0.2)

## 2018-02-03 NOTE — Progress Notes (Signed)
[name], MD Phone: [phone]  Wanda Lang is a 67 y.o. female who presents today for f/u.  CC: Sinusitis, chronic diarrhea, rheumatoid arthritis  Sinusitis: Patient has been following with ENT.  She underwent a 30-day course of clindamycin following a CT scan of her sinuses that revealed left maxillary sinus infection.  She had a follow-up CT scan that revealed the infection was still there.  She notes occasional discomfort in that sinus when she brushes the skin in that area.  She did note feeling feverish with this previously. Last occurred in early December. Typically temperature would be 99.5 F.  It was never higher than 100 F.  Has not recurred in a number of weeks.  Chronic diarrhea: Patient notes this improved quite a bit after starting apple cider vinegar.  She notes after going on antibiotics it did worsen again slightly though never back to how bad it was previously.  She noted on one occasion there was a small spot of blood after she had become raw from wiping.  This has not recurred.  She is due to have a colonoscopy and an endoscopy through GI who has evaluated her.  Rheumatoid arthritis: She continues to follow with rheumatology.  She is currently on prednisone.  She is off of her Orencia.  She notes the prednisone does help with the pain in her hands and feet.  Heart murmur: This is noted on exam.  She notes no history of this.  She has had no chest pain or shortness of breath.  No edema.  No lightheadedness.  Social History   Tobacco Use  Smoking Status Never Smoker  Smokeless Tobacco Never Used     ROS see history of present illness  Objective  Physical Exam Vitals:   02/03/18 0905  BP: 120/78  Pulse: 71  Temp: 97.9 F (36.6 C)  SpO2: 93%    BP Readings from Last 3 Encounters:  02/03/18 120/78  12/25/17 127/76  10/09/17 120/80   Wt Readings from Last 3 Encounters:  02/03/18 254 lb 12.8 oz (115.6 kg)  12/25/17 257 lb 12.8 oz (116.9 kg)    10/09/17 249 lb 6 oz (113.1 kg)    Physical Exam Constitutional:      General: She is not in acute distress.    Appearance: She is not diaphoretic.  Cardiovascular:     Rate and Rhythm: Normal rate and regular rhythm.     Heart sounds: Murmur (2/6 systolic ejection murmur right upper sternal border) present. No friction rub. No gallop.   Pulmonary:     Effort: Pulmonary effort is normal.     Breath sounds: Normal breath sounds.  Skin:    General: Skin is warm and dry.  Neurological:     Mental Status: She is alert.      Assessment/Plan: Please see individual problem list.  Sinusitis Patient has had recurrent issues with this.  She has been evaluated by ENT.  She notes she will be undergoing surgery at some point for this.  I suspect her prior fevers were related to her infection.  She has not had recurrence in several weeks though given their prior persistence and her heart murmur we will obtain blood cultures to evaluate further.  She is very well-appearing and has not had recurrent fevers in several weeks and thus I have low suspicion for an endocarditis.  I suspect these prior symptoms are related to her sinus infection.  Heart murmur Newly noted.  Based on location suspect aortic  valve as the source.  The patient previously had recurrent fevers though those were likely related to her recurrent sinus infection.  She is very well-appearing and thus I have a low suspicion for endocarditis though we will obtain blood cultures to evaluate further.  She will have an echo as well.  Discussed return precautions.  Rheumatoid arthritis ([place]) She will continue to follow with her rheumatologist.  Chronic diarrhea This has improved significantly.  She is going to have a colonoscopy and an endoscopy through GI.  She should keep her appointments for those.    Orders Placed This Encounter  Procedures  . Culture, blood (routine x 2)    Please collect from 2 different sites    Standing  Status:   Future    Number of Occurrences:   1    Standing Expiration Date:   02/04/2019  . CBC    Standing Status:   Future    Number of Occurrences:   1    Standing Expiration Date:   02/04/2019  . Comp Met (CMET)    Standing Status:   Future    Number of Occurrences:   1    Standing Expiration Date:   02/04/2019  . ECHOCARDIOGRAM COMPLETE    Standing Status:   Future    Standing Expiration Date:   05/05/2019    Order Specific Question:   Where should this test be performed    Answer:   [facility] [place]    Order Specific Question:   Please indicate who you request to read the echo results.    Answer:   [facility]    Order Specific Question:   Perflutren DEFINITY (image enhancing agent) should be administered unless hypersensitivity or allergy exist    Answer:   Administer Perflutren    Order Specific Question:   Reason for exam-Echo    Answer:   Murmur  785.2 / R01.1    No orders of the defined types were placed in this encounter.    [name], MD [place]

## 2018-02-03 NOTE — Telephone Encounter (Signed)
Called patient to discuss liver function test.  They are elevated.  There was no answer.  I asked her to call back to the office tomorrow.  Will attempt to contact the patient tomorrow as well.

## 2018-02-03 NOTE — Patient Instructions (Signed)
Nice to see you.  We will get an echo to evaluate your heart murmur.  Please continue to see the ENT and GI physicians.  If you develop light headedness, shortness of breath, chest pain, or persistent fevers please be evaluated.

## 2018-02-04 NOTE — Telephone Encounter (Signed)
Pt returning call to Dr. Caryl Bis

## 2018-02-04 NOTE — Telephone Encounter (Signed)
Please call the patient back and let her know her LFTs are elevated. Please see if she is having right upper quadrant abdominal pain.  If she is we will need to arrange for an ultrasound. If she is not, I would like to recheck her LFTs today or Thursday. Thanks.

## 2018-02-04 NOTE — Telephone Encounter (Signed)
LFTs ordered.

## 2018-02-04 NOTE — Telephone Encounter (Signed)
Patient stated that she was not having any pain. She is scheduled Thursday for LFT recheck.

## 2018-02-06 ENCOUNTER — Other Ambulatory Visit: Payer: Self-pay | Admitting: Family Medicine

## 2018-02-06 ENCOUNTER — Other Ambulatory Visit (INDEPENDENT_AMBULATORY_CARE_PROVIDER_SITE_OTHER): Payer: Medicare Other

## 2018-02-06 DIAGNOSIS — R945 Abnormal results of liver function studies: Secondary | ICD-10-CM | POA: Diagnosis not present

## 2018-02-06 DIAGNOSIS — R7989 Other specified abnormal findings of blood chemistry: Secondary | ICD-10-CM

## 2018-02-06 DIAGNOSIS — R011 Cardiac murmur, unspecified: Secondary | ICD-10-CM | POA: Insufficient documentation

## 2018-02-06 DIAGNOSIS — I350 Nonrheumatic aortic (valve) stenosis: Secondary | ICD-10-CM | POA: Insufficient documentation

## 2018-02-06 LAB — HEPATIC FUNCTION PANEL
ALBUMIN: 3.5 g/dL (ref 3.5–5.2)
ALT: 282 U/L — ABNORMAL HIGH (ref 0–35)
AST: 192 U/L — ABNORMAL HIGH (ref 0–37)
Alkaline Phosphatase: 203 U/L — ABNORMAL HIGH (ref 39–117)
Bilirubin, Direct: 0.2 mg/dL (ref 0.0–0.3)
TOTAL PROTEIN: 7.1 g/dL (ref 6.0–8.3)
Total Bilirubin: 0.7 mg/dL (ref 0.2–1.2)

## 2018-02-06 NOTE — Assessment & Plan Note (Signed)
Patient has had recurrent issues with this.  She has been evaluated by ENT.  She notes she will be undergoing surgery at some point for this.  I suspect her prior fevers were related to her infection.  She has not had recurrence in several weeks though given their prior persistence and her heart murmur we will obtain blood cultures to evaluate further.  She is very well-appearing and has not had recurrent fevers in several weeks and thus I have low suspicion for an endocarditis.  I suspect these prior symptoms are related to her sinus infection.

## 2018-02-06 NOTE — Assessment & Plan Note (Signed)
Newly noted.  Based on location suspect aortic valve as the source.  The patient previously had recurrent fevers though those were likely related to her recurrent sinus infection.  She is very well-appearing and thus I have a low suspicion for endocarditis though we will obtain blood cultures to evaluate further.  She will have an echo as well.  Discussed return precautions.

## 2018-02-06 NOTE — Assessment & Plan Note (Signed)
She will continue to follow with her rheumatologist.

## 2018-02-06 NOTE — Assessment & Plan Note (Signed)
This has improved significantly.  She is going to have a colonoscopy and an endoscopy through GI.  She should keep her appointments for those.

## 2018-02-08 LAB — CULTURE, BLOOD (ROUTINE X 2)
Culture: NO GROWTH
Culture: NO GROWTH

## 2018-02-13 ENCOUNTER — Other Ambulatory Visit: Payer: Self-pay | Admitting: Family Medicine

## 2018-02-13 ENCOUNTER — Ambulatory Visit
Admission: RE | Admit: 2018-02-13 | Discharge: 2018-02-13 | Disposition: A | Discharge status: Home / Self Care | Payer: Medicare Other | Source: Ambulatory Visit | Attending: Family Medicine | Admitting: Family Medicine

## 2018-02-13 DIAGNOSIS — R945 Abnormal results of liver function studies: Secondary | ICD-10-CM | POA: Insufficient documentation

## 2018-02-13 DIAGNOSIS — R011 Cardiac murmur, unspecified: Secondary | ICD-10-CM | POA: Diagnosis not present

## 2018-02-13 DIAGNOSIS — R7989 Other specified abnormal findings of blood chemistry: Secondary | ICD-10-CM

## 2018-02-13 DIAGNOSIS — K760 Fatty (change of) liver, not elsewhere classified: Secondary | ICD-10-CM | POA: Diagnosis not present

## 2018-02-13 NOTE — Progress Notes (Signed)
*  PRELIMINARY RESULTS* Echocardiogram 2D Echocardiogram has been performed.  Sherrie Sport 02/13/2018, 10:41 AM

## 2018-02-20 ENCOUNTER — Ambulatory Visit: Admission: RE | Admit: 2018-02-20 | Payer: Medicare Other | Source: Home / Self Care | Admitting: Gastroenterology

## 2018-02-20 ENCOUNTER — Encounter: Admission: RE | Payer: Self-pay | Source: Home / Self Care

## 2018-02-20 SURGERY — COLONOSCOPY WITH PROPOFOL
Anesthesia: General

## 2018-02-25 ENCOUNTER — Ambulatory Visit: Payer: Medicare Other

## 2018-02-26 ENCOUNTER — Ambulatory Visit (INDEPENDENT_AMBULATORY_CARE_PROVIDER_SITE_OTHER): Payer: Medicare Other

## 2018-02-26 DIAGNOSIS — Z23 Encounter for immunization: Secondary | ICD-10-CM | POA: Diagnosis not present

## 2018-02-26 NOTE — Progress Notes (Signed)
Pt was seen today for NV for Hep A and B vaccination. Given IM in the LD. Pt tolerated well.

## 2018-03-28 ENCOUNTER — Telehealth: Payer: Self-pay

## 2018-03-28 NOTE — Telephone Encounter (Signed)
Copied from [place] [id number]. Topic: General - Inquiry >> Mar 28, 2018  4:47 PM Berneta Levins wrote: Reason for CRM:   Pt wants to know if her ENT called and spoke with Dr. Caryl Bis or his assistant this week, Pt can be reached at [phone]

## 2018-03-31 ENCOUNTER — Telehealth: Payer: Self-pay

## 2018-03-31 NOTE — Telephone Encounter (Signed)
Patient OV scheduled for surgical clearance 04/02/18 paperwork in yellow folder.

## 2018-03-31 NOTE — Telephone Encounter (Signed)
Patient said she would like [place] to call her as soon as this is handled. [phone]

## 2018-03-31 NOTE — Telephone Encounter (Signed)
Juliann Pulse Please follow up with patient regarding ENT clearance

## 2018-03-31 NOTE — Telephone Encounter (Signed)
Copied from [place] [id number]. Topic: Complaint - Staff >> Mar 31, 2018  9:36 AM Bea Graff, NT wrote: Date of Incident: several Details of complaint: Pt states that she is frustrated with the lack of good communication and receiving call backs in a timely manner. How would the patient like to see it resolved? Speak with office manager On a scale of 1-10, how was your experience?  What would it take to bring it to a 10?   Route to Engineer, building services.

## 2018-03-31 NOTE — Telephone Encounter (Signed)
Amy with [facility] states she called last week about surgical clearance for this pt's upcoming sinus surgery, but had not heard anything back.  Amy is going to fax the clearance form to the doctor with "attn [facility]" I provided the fax number

## 2018-04-02 ENCOUNTER — Encounter: Payer: Self-pay | Admitting: Family Medicine

## 2018-04-02 ENCOUNTER — Ambulatory Visit (INDEPENDENT_AMBULATORY_CARE_PROVIDER_SITE_OTHER): Payer: Medicare Other | Admitting: Family Medicine

## 2018-04-02 VITALS — BP 120/70 | HR 88 | Temp 98.5°F | Wt 266.8 lb

## 2018-04-02 DIAGNOSIS — R0609 Other forms of dyspnea: Secondary | ICD-10-CM

## 2018-04-02 NOTE — Patient Instructions (Addendum)
Nice to see you. We will get lab work today and contact you with the results. We will have you see cardiology to complete cardiac clearance through them.

## 2018-04-03 DIAGNOSIS — R0609 Other forms of dyspnea: Principal | ICD-10-CM | POA: Insufficient documentation

## 2018-04-03 LAB — COMPREHENSIVE METABOLIC PANEL
ALT: 14 U/L (ref 0–35)
AST: 18 U/L (ref 0–37)
Albumin: 3.7 g/dL (ref 3.5–5.2)
Alkaline Phosphatase: 97 U/L (ref 39–117)
BUN: 22 mg/dL (ref 6–23)
CO2: 30 mEq/L (ref 19–32)
Calcium: 9 mg/dL (ref 8.4–10.5)
Chloride: 105 mEq/L (ref 96–112)
Creatinine, Ser: 0.8 mg/dL (ref 0.40–1.20)
GFR: 71.47 mL/min (ref >60.00)
Glucose, Bld: 79 mg/dL (ref 70–99)
Potassium: 4.3 mEq/L (ref 3.5–5.1)
Sodium: 142 mEq/L (ref 135–145)
Total Bilirubin: 0.6 mg/dL (ref 0.2–1.2)
Total Protein: 6.9 g/dL (ref 6.0–8.3)

## 2018-04-03 LAB — CBC
HCT: 43 % (ref 36.0–46.0)
Hemoglobin: 14.7 g/dL (ref 12.0–15.0)
MCHC: 34.1 g/dL (ref 30.0–36.0)
MCV: 92.4 fl (ref 78.0–100.0)
Platelets: 196 10*3/uL (ref 150.0–400.0)
RBC: 4.65 Mil/uL (ref 3.87–5.11)
RDW: 13.3 % (ref 11.5–15.5)
WBC: 9.6 10*3/uL (ref 4.0–10.5)

## 2018-04-03 NOTE — Assessment & Plan Note (Signed)
Patient presents for surgical clearance.  She is not able to complete 4 METS of activity without symptoms of dyspnea.  This may be related to obesity or deconditioning though could represent a cardiac issue.  EKG is reassuring.  Echo previously with grade 1 diastolic dysfunction and aortic stenosis.  We will refer to cardiology to determine if she would need a stress test.  We will check lab work as outlined below.  Medically she is stable for surgery though she needs cardiac clearance from cardiology prior to surgery.

## 2018-04-03 NOTE — Progress Notes (Signed)
[name], MD Phone: [phone]  Wanda Lang is a 68 y.o. female who presents today for follow-up.  CC: Surgical clearance for left sinus surgery  Patient notes no chest pain with going up stairs though she does get short of breath with climbing stairs or with walking for any duration.  This is stable over time.  She has a history of fatty liver disease.  She notes no kidney disease.  No personal or family history of anesthetic issues.  No history of heart attack or irregular heartbeat.  No history of stroke.  No history of seizures. No history of heart failure.  No history of asthma or bronchitis.  No history of diabetes.  Recent echo completed for new heart murmur with grade 1 diastolic dysfunction as well as mild aortic stenosis and calcification around her mitral valve annulus.  Social History   Tobacco Use  Smoking Status Never Smoker  Smokeless Tobacco Never Used     ROS see history of present illness  Objective  Physical Exam Vitals:   04/02/18 1349  BP: 120/70  Pulse: 88  Temp: 98.5 F (36.9 C)  SpO2: 97%    BP Readings from Last 3 Encounters:  04/02/18 120/70  02/03/18 120/78  12/25/17 127/76   Wt Readings from Last 3 Encounters:  04/02/18 266 lb 12.8 oz (121 kg)  02/03/18 254 lb 12.8 oz (115.6 kg)  12/25/17 257 lb 12.8 oz (116.9 kg)    Physical Exam Constitutional:      General: She is not in acute distress.    Appearance: She is not diaphoretic.  HENT:     Mouth/Throat:     Mouth: Mucous membranes are moist.     Pharynx: Oropharynx is clear.  Eyes:     Conjunctiva/sclera: Conjunctivae normal.     Pupils: Pupils are equal, round, and reactive to light.  Cardiovascular:     Rate and Rhythm: Normal rate and regular rhythm.     Heart sounds: Normal heart sounds.  Pulmonary:     Effort: Pulmonary effort is normal.     Breath sounds: Normal breath sounds.  Abdominal:     General: Bowel sounds are normal. There is no distension.   Palpations: Abdomen is soft.     Tenderness: There is no abdominal tenderness.  Musculoskeletal:     Right lower leg: No edema.     Left lower leg: No edema.  Skin:    General: Skin is warm and dry.  Neurological:     Mental Status: She is alert.    EKG: Normal sinus rhythm, rate 91, left atrial enlargement, no ischemic changes  Assessment/Plan: Please see individual problem list.  DOE (dyspnea on exertion) Patient presents for surgical clearance.  She is not able to complete 4 METS of activity without symptoms of dyspnea.  This may be related to obesity or deconditioning though could represent a cardiac issue.  EKG is reassuring.  Echo previously with grade 1 diastolic dysfunction and aortic stenosis.  We will refer to cardiology to determine if she would need a stress test.  We will check lab work as outlined below.  Medically she is stable for surgery though she needs cardiac clearance from cardiology prior to surgery.   Orders Placed This Encounter  Procedures  . Comp Met (CMET)  . CBC  . Ambulatory referral to Cardiology    Referral Priority:   Routine    Referral Type:   Consultation    Referral Reason:   Specialty Services  Required    Requested Specialty:   Cardiology    Number of Visits Requested:   1  . EKG 12-Lead    No orders of the defined types were placed in this encounter.    [name], MD [place]

## 2018-04-14 ENCOUNTER — Telehealth: Payer: Self-pay

## 2018-04-14 DIAGNOSIS — I10 Essential (primary) hypertension: Secondary | ICD-10-CM | POA: Diagnosis not present

## 2018-04-14 DIAGNOSIS — I350 Nonrheumatic aortic (valve) stenosis: Secondary | ICD-10-CM | POA: Diagnosis not present

## 2018-04-14 DIAGNOSIS — R0609 Other forms of dyspnea: Secondary | ICD-10-CM | POA: Diagnosis not present

## 2018-04-14 NOTE — Telephone Encounter (Signed)
Copied from [place] [id number]. Topic: General - Other >> Apr 14, 2018  9:26 AM Sheran Luz wrote: Reason for CRM: Reason for CRM: Lovey Newcomer with [facility] calling to request patients last EKG be faxed to office as they are not able to print from care everything.Patients appointment is today.  Please advise.    Fax# [phone] [place]

## 2018-04-14 NOTE — Telephone Encounter (Signed)
Faxed last EKG to [facility] as requested.

## 2018-04-18 ENCOUNTER — Encounter: Payer: Self-pay | Admitting: Family Medicine

## 2018-04-18 ENCOUNTER — Ambulatory Visit (INDEPENDENT_AMBULATORY_CARE_PROVIDER_SITE_OTHER): Payer: Medicare Other | Admitting: Family Medicine

## 2018-04-18 ENCOUNTER — Other Ambulatory Visit: Payer: Self-pay

## 2018-04-18 VITALS — BP 142/80 | HR 86 | Temp 98.3°F | Resp 18 | Ht 64.5 in | Wt 261.8 lb

## 2018-04-18 DIAGNOSIS — J019 Acute sinusitis, unspecified: Secondary | ICD-10-CM

## 2018-04-18 MED ORDER — DOXYCYCLINE HYCLATE 100 MG PO TABS: 100.0000 mg | ORAL_TABLET | Freq: Two times a day (BID) | ORAL | 0 refills | Status: DC

## 2018-04-18 NOTE — Patient Instructions (Signed)
How to Perform a Sinus Rinse  A sinus rinse is a home treatment. It rinses your sinuses with a mixture of salt and water (saline solution). Sinuses are air-filled spaces in your skull behind the bones of your face and forehead. They open into your nasal cavity.  A sinus rinse can help to clear your nasal cavity. It can clear mucus, dirt, dust, or pollen.  You may do a sinus rinse when you have:   A cold.   A virus.   Allergies.   A sinus infection.   A stuffy nose.  Talk with your doctor about whether a sinus rinse might help you.  What are the risks?  A sinus rinse is normally very safe and helpful. However, there are a few risks. These include:   A burning feeling in the sinuses. This may happen if you do not make the saline solution as instructed. Be sure to follow all directions when making the saline solution.   Nasal irritation.   Infection from unclean water. This is rare, but possible.  Do not do a sinus rinse if you have had:   Ear or nasal surgery.   An ear infection.   Blocked ears.  Supplies needed:   Saline solution or powder.   Distilled or germ-free (sterile) water may be needed to mix with saline powder.  ? You may use boiled and cooled tap water. Boil tap water for 5 minutes; cool until it is lukewarm. Use within 24 hours.  ? Do not use regular tap water to mix with the saline solution.   Neti pot or nasal rinse bottle. This releases the saline solution into your nose and through your sinuses. You can buy neti pots and rinse bottles:  ? At your local pharmacy.  ? At a health food store.  ? Online.  How to perform a sinus rinse    1. Wash your hands with soap and water.  2. Wash your device using the directions that came with it.  3. Dry your device.  4. Use the solution that comes with your device or one that is sold separately in stores. Follow the mixing directions on the package if you need to mix with sterile or distilled water.  5. Fill your device with the amount of saline  solution stated in the device instructions.  6. Stand over a sink and tilt your head sideways over the sink.  7. Place the spout of the device in your upper nostril (the one closer to the ceiling).  8. Gently pour or squeeze the saline solution into your nasal cavity. The liquid should drain to your lower nostril if you are not too stuffed up (congested).  9. While rinsing, breathe through your open mouth.  10. Gently blow your nose to clear any mucus and rinse solution. Blowing too hard may cause ear pain.  11. Repeat in your other nostril.  12. Clean and rinse your device with clean water.  13. Air-dry your device.  Talk with your doctor or pharmacist if you have questions about how to do a sinus rinse.  Summary   A sinus rinse is a home treatment. It rinses your sinuses with a mixture of salt and water (saline solution).   A sinus rinse is normally very safe and helpful. Follow all instructions carefully.   Talk with your doctor about whether a sinus rinse might help you.  This information is not intended to replace advice given to you by your health care   provider. Make sure you discuss any questions you have with your health care provider.  Document Released: 08/19/2013 Document Revised: 11/19/2016 Document Reviewed: 11/19/2016  Elsevier Interactive Patient Education  2019 Elsevier Inc.

## 2018-04-18 NOTE — Progress Notes (Signed)
Subjective:    Patient ID: Wanda Lang, female    DOB: 05-18-1950, 68 y.o.   MRN: [id number]  HPI   Patient presents to clinic complaining of sinus congestion, sinus pressure, pain across forehead and above teeth, thick nasal drainage postnasal drainage for past week and a half.  Patient is tried over-the-counter cough and cold medications, but nothing seems to be helping symptoms.  Patient also reports some crustiness on her eyes in the mornings, usually is white or tan in color and then goes away after she washes her face in the morning.  Denies thick goopy eye drainage or vision changes.  Denies fever or chills.  Denies chest pain, shortness of breath or wheezing.  Denies nausea or vomiting.  Patient Active Problem List   Diagnosis Date Noted  . DOE (dyspnea on exertion) 04/03/2018  . Heart murmur 02/06/2018  . Chronic diarrhea 10/09/2017  . Pes anserine bursitis 07/15/2017  . Acid reflux 07/15/2017  . Asymptomatic carotid artery stenosis 07/15/2017  . Cyst of skin 07/15/2017  . Sinusitis 11/09/2016  . Immunocompromised ([place]) 11/09/2016  . Allergic rhinitis 11/02/2016  . Tremor 11/02/2016  . Hypertension 11/02/2016  . Postmenopausal symptoms 07/01/2015  . Rheumatoid arthritis involving both hands with positive rheumatoid factor ([place]) 07/01/2015  . Seasonal allergic rhinitis due to pollen 07/01/2015  . Fatty liver 09/08/2007  . Rheumatoid arthritis ([place]) 09/08/2007   Social History   Tobacco Use  . Smoking status: Never Smoker  . Smokeless tobacco: Never Used  Substance Use Topics  . Alcohol use: Yes    Alcohol/week: 1.0 standard drinks    Types: 1 Glasses of wine per week   Review of Systems   Constitutional: Negative for chills, fatigue and fever.  HENT: +nasal congestion, sinus pain, drainage.    Eyes: +eyes crusty in AMs Respiratory: +cough. Negative for shortness of breath and wheezing.   Cardiovascular: Negative for chest pain, palpitations and leg  swelling.  Gastrointestinal: Negative for abdominal pain, diarrhea, nausea and vomiting.  Genitourinary: Negative for dysuria, frequency and urgency.  Musculoskeletal: Negative for arthralgias and myalgias.  Skin: Negative for color change, pallor and rash.  Neurological: Negative for syncope, light-headedness and headaches.  Psychiatric/Behavioral: The patient is not nervous/anxious.       Objective:   Physical Exam Vitals signs and nursing note reviewed.  Constitutional:      General: She is not in acute distress.    Appearance: She is not toxic-appearing or diaphoretic.  HENT:     Head: Normocephalic and atraumatic.     Right Ear: There is no impacted cerumen.     Left Ear: There is no impacted cerumen.     Ears:     Comments: +fullness bilat    Nose: Nasal tenderness, congestion and rhinorrhea present.     Right Sinus: Maxillary sinus tenderness and frontal sinus tenderness present.     Left Sinus: Maxillary sinus tenderness and frontal sinus tenderness present.     Comments: +post nasal drip    Mouth/Throat:     Mouth: Mucous membranes are moist.     Pharynx: No oropharyngeal exudate or posterior oropharyngeal erythema.  Eyes:     General: No scleral icterus.       Right eye: No discharge.        Left eye: No discharge.     Extraocular Movements: Extraocular movements intact.     Conjunctiva/sclera: Conjunctivae normal.     Pupils: Pupils are equal, round, and reactive to light.  Neck:     Musculoskeletal: Neck supple. No neck rigidity.  Cardiovascular:     Rate and Rhythm: Normal rate and regular rhythm.  Pulmonary:     Effort: Pulmonary effort is normal. No respiratory distress.     Breath sounds: No wheezing, rhonchi or rales.  Lymphadenopathy:     Cervical: No cervical adenopathy.  Skin:    General: Skin is warm and dry.     Coloration: Skin is not pale.  Neurological:     Mental Status: She is alert and oriented to person, place, and time.  Psychiatric:         Mood and Affect: Mood normal.        Behavior: Behavior normal.    Vitals:   04/18/18 1112  BP: (!) 142/80  Pulse: 86  Resp: 18  Temp: 98.3 F (36.8 C)  SpO2: 96%      Assessment & Plan:   Acute sinusitis - patient's symptoms and exam are consistent with a sinusitis infection.  She will take antibiotic course twice daily for 10 days.  Also advised to do saline nasal rinses to help reduce sinus congestion and drainage.  Advised to rest, increase fluid intake and do good handwashing.  Eyes do not appear to be a bacterial conjunctivitis, most likely related to an allergy I and or from sinus infection.  Advised to use an allergy eyedrop if notices any itching or crusting in eyes, and wash her eyes to cleanse of any crusting/drainage.  Patient will keep regular scheduled follow-up with PCP as planned.  Advised to return to clinic sooner if any issues arise.

## 2018-05-05 ENCOUNTER — Ambulatory Visit: Payer: Medicare Other | Admitting: Family Medicine

## 2018-06-05 DIAGNOSIS — Z79899 Other long term (current) drug therapy: Secondary | ICD-10-CM | POA: Diagnosis not present

## 2018-06-05 DIAGNOSIS — M25569 Pain in unspecified knee: Secondary | ICD-10-CM | POA: Diagnosis not present

## 2018-06-05 DIAGNOSIS — M549 Dorsalgia, unspecified: Secondary | ICD-10-CM | POA: Diagnosis not present

## 2018-06-05 DIAGNOSIS — M7050 Other bursitis of knee, unspecified knee: Secondary | ICD-10-CM | POA: Diagnosis not present

## 2018-06-05 DIAGNOSIS — M150 Primary generalized (osteo)arthritis: Secondary | ICD-10-CM | POA: Diagnosis not present

## 2018-06-05 DIAGNOSIS — M0589 Other rheumatoid arthritis with rheumatoid factor of multiple sites: Secondary | ICD-10-CM | POA: Diagnosis not present

## 2018-06-20 DIAGNOSIS — M0589 Other rheumatoid arthritis with rheumatoid factor of multiple sites: Secondary | ICD-10-CM | POA: Diagnosis not present

## 2018-07-16 DIAGNOSIS — I10 Essential (primary) hypertension: Secondary | ICD-10-CM | POA: Diagnosis not present

## 2018-07-16 DIAGNOSIS — R0600 Dyspnea, unspecified: Secondary | ICD-10-CM | POA: Diagnosis not present

## 2018-07-16 DIAGNOSIS — I350 Nonrheumatic aortic (valve) stenosis: Secondary | ICD-10-CM | POA: Diagnosis not present

## 2018-07-21 DIAGNOSIS — M0589 Other rheumatoid arthritis with rheumatoid factor of multiple sites: Secondary | ICD-10-CM | POA: Diagnosis not present

## 2018-07-23 ENCOUNTER — Ambulatory Visit: Payer: Medicare Other | Admitting: Family Medicine

## 2018-07-29 DIAGNOSIS — M25511 Pain in right shoulder: Secondary | ICD-10-CM | POA: Diagnosis not present

## 2018-07-30 DIAGNOSIS — M19011 Primary osteoarthritis, right shoulder: Secondary | ICD-10-CM | POA: Diagnosis not present

## 2018-08-06 DIAGNOSIS — Z1231 Encounter for screening mammogram for malignant neoplasm of breast: Secondary | ICD-10-CM | POA: Diagnosis not present

## 2018-08-06 DIAGNOSIS — Z6841 Body Mass Index (BMI) 40.0 and over, adult: Secondary | ICD-10-CM | POA: Diagnosis not present

## 2018-08-06 DIAGNOSIS — Z124 Encounter for screening for malignant neoplasm of cervix: Secondary | ICD-10-CM | POA: Diagnosis not present

## 2018-08-12 ENCOUNTER — Telehealth: Payer: Self-pay | Admitting: Gastroenterology

## 2018-08-12 NOTE — Telephone Encounter (Signed)
I CALLED PATIENT TO REMIND HER OF HER APPT & SHE HAD NOT HAD HER COLONOSCOPY. pLEASE SCHEDULE.

## 2018-08-13 ENCOUNTER — Ambulatory Visit: Payer: Medicare Other | Admitting: Gastroenterology

## 2018-08-18 DIAGNOSIS — M0589 Other rheumatoid arthritis with rheumatoid factor of multiple sites: Secondary | ICD-10-CM | POA: Diagnosis not present

## 2018-08-20 ENCOUNTER — Other Ambulatory Visit: Payer: Self-pay

## 2018-08-20 DIAGNOSIS — Z1211 Encounter for screening for malignant neoplasm of colon: Secondary | ICD-10-CM

## 2018-08-20 DIAGNOSIS — R197 Diarrhea, unspecified: Secondary | ICD-10-CM

## 2018-08-20 NOTE — Telephone Encounter (Signed)
Procedure has been scheduled for 09/04/2018, pt has been notified and verbalized understanding

## 2018-08-21 ENCOUNTER — Telehealth: Payer: Self-pay | Admitting: Family Medicine

## 2018-08-21 DIAGNOSIS — R7989 Other specified abnormal findings of blood chemistry: Secondary | ICD-10-CM

## 2018-08-21 NOTE — Telephone Encounter (Signed)
Please let the patient know that I received lab results from her rheumatologist for it looks as though her alkaline phosphatase and liver function tests were mildly elevated.  Please see if she has been having any abdominal pain.  Please see if she is willing to come in and have those rechecked.  Orders placed.  Thanks.

## 2018-08-25 NOTE — Telephone Encounter (Signed)
Pt aware of lab results.  Pt denied having any abdominal pain.  Pt is ok to come to have labs rechecked.  Pt was unable to scheduled appt at time of call.  Pt requested a return call later today to schedule lab appt.

## 2018-09-01 ENCOUNTER — Other Ambulatory Visit: Payer: Self-pay

## 2018-09-01 ENCOUNTER — Telehealth: Payer: Self-pay | Admitting: Gastroenterology

## 2018-09-01 ENCOUNTER — Other Ambulatory Visit
Admission: RE | Admit: 2018-09-01 | Discharge: 2018-09-01 | Disposition: A | Discharge status: Home / Self Care | Payer: Medicare Other | Source: Ambulatory Visit | Attending: Gastroenterology | Admitting: Gastroenterology

## 2018-09-01 DIAGNOSIS — Z20828 Contact with and (suspected) exposure to other viral communicable diseases: Secondary | ICD-10-CM | POA: Insufficient documentation

## 2018-09-01 LAB — SARS CORONAVIRUS 2 (TAT 6-24 HRS): SARS Coronavirus 2: NEGATIVE

## 2018-09-01 NOTE — Telephone Encounter (Signed)
Pt is calling she has a procedure  09/04/18 and has several questions regarding  rx she should stop taking as well as the low fiber diet

## 2018-09-04 ENCOUNTER — Other Ambulatory Visit: Payer: Self-pay

## 2018-09-04 ENCOUNTER — Ambulatory Visit: Payer: Medicare Other | Admitting: Certified Registered Nurse Anesthetist

## 2018-09-04 ENCOUNTER — Encounter: Payer: Self-pay | Admitting: *Deleted

## 2018-09-04 ENCOUNTER — Encounter
Admission: RE | Disposition: A | Discharge status: Home / Self Care | Payer: Self-pay | Source: Home / Self Care | Attending: Gastroenterology

## 2018-09-04 ENCOUNTER — Ambulatory Visit
Admission: RE | Admit: 2018-09-04 | Discharge: 2018-09-04 | Disposition: A | Discharge status: Home / Self Care | Payer: Medicare Other | Attending: Gastroenterology | Admitting: Gastroenterology

## 2018-09-04 DIAGNOSIS — D131 Benign neoplasm of stomach: Secondary | ICD-10-CM | POA: Diagnosis not present

## 2018-09-04 DIAGNOSIS — I129 Hypertensive chronic kidney disease with stage 1 through stage 4 chronic kidney disease, or unspecified chronic kidney disease: Secondary | ICD-10-CM | POA: Diagnosis not present

## 2018-09-04 DIAGNOSIS — K2900 Acute gastritis without bleeding: Secondary | ICD-10-CM | POA: Diagnosis not present

## 2018-09-04 DIAGNOSIS — R197 Diarrhea, unspecified: Secondary | ICD-10-CM

## 2018-09-04 DIAGNOSIS — K269 Duodenal ulcer, unspecified as acute or chronic, without hemorrhage or perforation: Secondary | ICD-10-CM | POA: Insufficient documentation

## 2018-09-04 DIAGNOSIS — Z1211 Encounter for screening for malignant neoplasm of colon: Secondary | ICD-10-CM | POA: Insufficient documentation

## 2018-09-04 DIAGNOSIS — N189 Chronic kidney disease, unspecified: Secondary | ICD-10-CM | POA: Diagnosis not present

## 2018-09-04 DIAGNOSIS — D123 Benign neoplasm of transverse colon: Secondary | ICD-10-CM | POA: Diagnosis not present

## 2018-09-04 DIAGNOSIS — Z791 Long term (current) use of non-steroidal anti-inflammatories (NSAID): Secondary | ICD-10-CM | POA: Diagnosis not present

## 2018-09-04 DIAGNOSIS — Z79899 Other long term (current) drug therapy: Secondary | ICD-10-CM | POA: Diagnosis not present

## 2018-09-04 DIAGNOSIS — Z79890 Hormone replacement therapy (postmenopausal): Secondary | ICD-10-CM | POA: Insufficient documentation

## 2018-09-04 DIAGNOSIS — K317 Polyp of stomach and duodenum: Secondary | ICD-10-CM | POA: Insufficient documentation

## 2018-09-04 DIAGNOSIS — K2950 Unspecified chronic gastritis without bleeding: Secondary | ICD-10-CM | POA: Diagnosis not present

## 2018-09-04 DIAGNOSIS — K635 Polyp of colon: Secondary | ICD-10-CM | POA: Diagnosis not present

## 2018-09-04 DIAGNOSIS — Z6841 Body Mass Index (BMI) 40.0 and over, adult: Secondary | ICD-10-CM | POA: Diagnosis not present

## 2018-09-04 DIAGNOSIS — K2970 Gastritis, unspecified, without bleeding: Secondary | ICD-10-CM | POA: Diagnosis not present

## 2018-09-04 DIAGNOSIS — K3189 Other diseases of stomach and duodenum: Secondary | ICD-10-CM

## 2018-09-04 HISTORY — PX: ESOPHAGOGASTRODUODENOSCOPY (EGD) WITH PROPOFOL: SHX5813

## 2018-09-04 HISTORY — PX: COLONOSCOPY WITH PROPOFOL: SHX5780

## 2018-09-04 SURGERY — ESOPHAGOGASTRODUODENOSCOPY (EGD) WITH PROPOFOL
Anesthesia: General

## 2018-09-04 MED ORDER — SODIUM CHLORIDE 0.9 % IV SOLN
INTRAVENOUS | Status: DC
  Administered 2018-09-04: 09:00:00 via INTRAVENOUS

## 2018-09-04 MED ORDER — LABETALOL HCL 5 MG/ML IV SOLN
INTRAVENOUS | Status: DC | PRN
  Administered 2018-09-04: 5 mg via INTRAVENOUS

## 2018-09-04 MED ORDER — PROPOFOL 10 MG/ML IV BOLUS
INTRAVENOUS | Status: AC
  Filled 2018-09-04: qty 20

## 2018-09-04 MED ORDER — PROPOFOL 500 MG/50ML IV EMUL
INTRAVENOUS | Status: DC | PRN
  Administered 2018-09-04: 175 ug/kg/min via INTRAVENOUS

## 2018-09-04 MED ORDER — PANTOPRAZOLE SODIUM 40 MG PO TBEC: 40.0000 mg | DELAYED_RELEASE_TABLET | Freq: Every day | ORAL | 1 refills | Status: DC

## 2018-09-04 MED ORDER — LIDOCAINE HCL (PF) 2 % IJ SOLN
INTRAMUSCULAR | Status: AC
  Filled 2018-09-04: qty 10

## 2018-09-04 MED ORDER — LIDOCAINE HCL (CARDIAC) PF 100 MG/5ML IV SOSY
PREFILLED_SYRINGE | INTRAVENOUS | Status: DC | PRN
  Administered 2018-09-04: 50 mg via INTRAVENOUS

## 2018-09-04 MED ORDER — PROPOFOL 500 MG/50ML IV EMUL
INTRAVENOUS | Status: AC
  Filled 2018-09-04: qty 50

## 2018-09-04 MED ORDER — PROPOFOL 10 MG/ML IV BOLUS
INTRAVENOUS | Status: DC | PRN
  Administered 2018-09-04: 50 mg via INTRAVENOUS

## 2018-09-04 NOTE — H&P (Signed)
[name], MD [address], [place], [place], [place], [address] [address], [place], [place], [place], [address] Phone: [phone]  Fax: [phone]  Primary Care Physician:  Leone Haven, MD   Pre-Procedure History & Physical: HPI:  Wanda Lang is a 68 y.o. female is here for a colonoscopy and EGD.   Past Medical History:  Diagnosis Date  . Allergy   . Arthritis   . Chronic kidney disease   . GERD (gastroesophageal reflux disease)   . Hypertension   . LIVER FUNCTION TESTS, ABNORMAL, HX OF 09/08/2007   Qualifier: Diagnosis of  By: Harlon Ditty CMA (AAMA), Dottie      Past Surgical History:  Procedure Laterality Date  . ABDOMINAL HYSTERECTOMY    . LAPAROSCOPY    . TURBINATE REDUCTION      Prior to Admission medications   Medication Sig Start Date End Date Taking? Authorizing Provider  Abatacept (ORENCIA) 125 MG/ML SOSY Inject into the vein.   Yes [provider]  acetaminophen (TYLENOL) 500 MG tablet Take 500 mg by mouth as needed.   Yes [provider]  calcium carbonate 1250 MG capsule Take by mouth.   Yes [provider]  celecoxib (CELEBREX) 200 MG capsule Take 200 mg by mouth daily.   Yes [provider]  Collagen 500 MG CAPS Take by mouth.   Yes [provider]  cyclobenzaprine (FLEXERIL) 5 MG tablet Take 5 mg by mouth as needed.   Yes [provider]  cycloSPORINE (RESTASIS) 0.05 % ophthalmic emulsion Apply to eye.   Yes [provider]  Estradiol (VAGIFEM) 10 MCG TABS vaginal tablet Place vaginally.   Yes [provider]  estradiol (VIVELLE-DOT) 0.05 MG/24HR patch Place 1 patch onto the skin 2 (two) times a week.   Yes [provider]  Flaxseed, Linseed, (FLAXSEED OIL) 1000 MG CAPS Take by mouth.   Yes [provider]  Glucosamine-Chondroitin 250-200 MG CAPS Take by mouth.   Yes [provider]  Loratadine 10 MG CAPS Take 10 mg by mouth as  needed.   Yes [provider]  Magnesium 100 MG CAPS Take by mouth.   Yes [provider]  milk thistle 175 MG tablet Take 175 mg by mouth daily.   Yes [provider]  Potassium 99 MG TABS Take 99 mg by mouth 2 (two) times daily.   Yes [provider]  selenium 50 MCG TABS tablet Take 50 mcg by mouth daily.   Yes [provider]  Vitamin D, Ergocalciferol, (DRISDOL) 50000 units CAPS capsule Take 50,000 Units by mouth every 14 (fourteen) days.   Yes [provider]  ZINC MT Use as directed in the mouth or throat.   Yes [provider]  Coenzyme Q10 (COQ10) 200 MG CAPS Take 200 mg by mouth daily.    [provider]  doxycycline (VIBRA-TABS) 100 MG tablet Take 1 tablet (100 mg total) by mouth 2 (two) times daily. Patient not taking: Reported on 09/04/2018 04/18/18   Jodelle Green, FNP  fluticasone (FLONASE) 50 MCG/ACT nasal spray Place 2 sprays into both nostrils daily. 09/20/17   Jodelle Green, FNP  predniSONE (DELTASONE) 10 MG tablet 20 mg. For sinus infection 10/18/17   [provider]    Allergies as of 08/20/2018 - Review Complete 04/18/2018  Allergen Reaction Noted  . Penicillins Itching 09/10/2007  . Amoxicillin  09/08/2007  . Sulfonamide derivatives  09/08/2007  . Ylang-ylang [cananga oil (ylang-ylang)] Itching  11/23/2016    Family History  Problem Relation Age of Onset  . Alcohol abuse Mother   . Arthritis Mother   . Hyperlipidemia Mother   . Heart disease Mother   . Stroke Mother   . Hypertension Mother   . Depression Mother   . Anxiety disorder Mother   . Arthritis Father   . Lung cancer Paternal Grandfather   . Kidney disease Paternal Grandfather     Social History   Socioeconomic History  . Marital status: Married    Spouse name: Not on file  . Number of children: Not on file  . Years of education: Not on file  . Highest education level: Not on file  Occupational History  . Not on  file  Social Needs  . Financial resource strain: Not on file  . Food insecurity    Worry: Not on file    Inability: Not on file  . Transportation needs    Medical: Not on file    Non-medical: Not on file  Tobacco Use  . Smoking status: Never Smoker  . Smokeless tobacco: Never Used  Substance and Sexual Activity  . Alcohol use: Yes    Alcohol/week: 1.0 standard drinks    Types: 1 Glasses of wine per week  . Drug use: No  . Sexual activity: Never  Lifestyle  . Physical activity    Days per week: Not on file    Minutes per session: Not on file  . Stress: Not on file  Relationships  . Social Herbalist on phone: Not on file    Gets together: Not on file    Attends religious service: Not on file    Active member of club or organization: Not on file    Attends meetings of clubs or organizations: Not on file    Relationship status: Not on file  . Intimate partner violence    Fear of current or ex partner: Not on file    Emotionally abused: Not on file    Physically abused: Not on file    Forced sexual activity: Not on file  Other Topics Concern  . Not on file  Social History Narrative  . Not on file    Review of Systems: See HPI, otherwise negative ROS  Physical Exam: BP (!) 150/89   Pulse (!) 103   Temp 97.6 F (36.4 C) (Tympanic)   Resp 17   Ht 5\' 4"  (1.626 m)   Wt 112.9 kg   SpO2 98%   BMI 42.74 kg/m  General:   Alert,  pleasant and cooperative in NAD Head:  Normocephalic and atraumatic. Neck:  Supple; no masses or thyromegaly. Lungs:  Clear throughout to auscultation, normal respiratory effort.    Heart:  +S1, +S2, Regular rate and rhythm, No edema. Abdomen:  Soft, nontender and nondistended. Normal bowel sounds, without guarding, and without rebound.   Neurologic:  Alert and  oriented x4;  grossly normal neurologically.  Impression/Plan: Wanda Lang is here for a colonoscopy to be performed for average risk screening and EGD for history of  gastric polyps.  Risks, benefits, limitations, and alternatives regarding the procedures have been reviewed with the patient.  Questions have been answered.  All parties agreeable.   Virgel Manifold, MD  09/04/2018, 9:51 AM

## 2018-09-04 NOTE — Anesthesia Preprocedure Evaluation (Addendum)
Anesthesia Evaluation  Patient identified by MRN, date of birth, ID band Patient awake    Reviewed: Allergy & Precautions, H&P , NPO status , Patient's Chart, lab work & pertinent test results  History of Anesthesia Complications Negative for: history of anesthetic complications  Airway Mallampati: I  TM Distance: >3 FB Neck ROM: full    Dental  (+) Teeth Intact   Pulmonary neg pulmonary ROS,           Cardiovascular Exercise Tolerance: Poor hypertension, + DOE    - Procedure narrative: Transthoracic echocardiography for left   ventricular function evaluation, for right ventricular function   evaluation, and for assessment of valvular function. Image   quality was suboptimal. The study was technically difficult. - Left ventricle: The cavity size was normal. Wall thickness was   increased in a pattern of mild LVH. Systolic function was normal.   The estimated ejection fraction was in the range of 60% to 65%.   Doppler parameters are consistent with abnormal left ventricular   relaxation (grade 1 diastolic dysfunction). - Aortic valve: Cusp separation was reduced. There was mild   stenosis. Mean gradient (S): 12 mm Hg. - Mitral valve: Severely calcified annulus. Mildly thickened   leaflets . - Right ventricle: The cavity size was mildly dilated.    Neuro/Psych negative neurological ROS  negative psych ROS   GI/Hepatic Neg liver ROS, GERD  Controlled,  Endo/Other  Morbid obesity  Renal/GU CRFRenal disease  negative genitourinary   Musculoskeletal   Abdominal   Peds  Hematology negative hematology ROS (+)   Anesthesia Other Findings Past Medical History: No date: Allergy No date: Arthritis No date: Chronic kidney disease No date: GERD (gastroesophageal reflux disease) No date: Hypertension 09/08/2007: LIVER FUNCTION TESTS, ABNORMAL, HX OF     Comment:  Qualifier: Diagnosis of  By: Nelson-Smith CMA (AAMA),                Dottie    Past Surgical History: No date: ABDOMINAL HYSTERECTOMY No date: LAPAROSCOPY No date: TURBINATE REDUCTION  BMI    Body Mass Index: 42.74 kg/m      Reproductive/Obstetrics negative OB ROS                           Anesthesia Physical Anesthesia Plan  ASA: III  Anesthesia Plan: General   Post-op Pain Management:    Induction:   PONV Risk Score and Plan: Propofol infusion and TIVA  Airway Management Planned: Natural Airway and Nasal Cannula  Additional Equipment:   Intra-op Plan:   Post-operative Plan:   Informed Consent: I have reviewed the patients History and Physical, chart, labs and discussed the procedure including the risks, benefits and alternatives for the proposed anesthesia with the patient or authorized representative who has indicated his/her understanding and acceptance.     Dental Advisory Given  Plan Discussed with: Anesthesiologist and CRNA  Anesthesia Plan Comments:        Anesthesia Quick Evaluation

## 2018-09-04 NOTE — Anesthesia Procedure Notes (Signed)
Date/Time: 09/04/2018 9:55 AM Performed by: Johnna Acosta, CRNA Pre-anesthesia Checklist: Patient identified, Emergency Drugs available, Suction available, Patient being monitored and Timeout performed Patient Re-evaluated:Patient Re-evaluated prior to induction Oxygen Delivery Method: Nasal cannula Preoxygenation: Pre-oxygenation with 100% oxygen Induction Type: IV induction

## 2018-09-04 NOTE — Transfer of Care (Signed)
Immediate Anesthesia Transfer of Care Note  Patient: Wanda Lang  Procedure(s) Performed: ESOPHAGOGASTRODUODENOSCOPY (EGD) WITH PROPOFOL (N/A ) COLONOSCOPY WITH PROPOFOL (N/A )  Patient Location: PACU  Anesthesia Type:General  Level of Consciousness: sedated  Airway & Oxygen Therapy: Patient Spontanous Breathing and Patient connected to nasal cannula oxygen  Post-op Assessment: Report given to RN and Post -op Vital signs reviewed and stable  Post vital signs: Reviewed and stable  Last Vitals:  Vitals Value Taken Time  BP 126/59 09/04/18 1055  Temp 36.4 C 09/04/18 1055  Pulse 87 09/04/18 1056  Resp 30 09/04/18 1056  SpO2 96 % 09/04/18 1056  Vitals shown include unvalidated device data.  Last Pain:  Vitals:   09/04/18 1055  TempSrc: Tympanic  PainSc: 0-No pain         Complications: No apparent anesthesia complications

## 2018-09-04 NOTE — Op Note (Signed)
[facility]astroenterology Patient Name: Wanda Lang Procedure Date: 09/04/2018 9:54 AM MRN: [id number] Account #: [id number] Date of Birth: 1950-05-05 Admit Type: Outpatient Age: 68 Room: [facility] ENDO ROOM 2 Gender: Female Note Status: Finalized Procedure:            Colonoscopy Indications:          Screening for colorectal malignant neoplasm Providers:            Alixandrea Milleson B. Bonna Gains MD, MD Referring MD:         Angela Adam. Caryl Bis (Referring MD) Medicines:            Monitored Anesthesia Care Complications:        No immediate complications. Procedure:            Pre-Anesthesia Assessment:                       - ASA Grade Assessment: II - A patient with mild                        systemic disease.                       - Prior to the procedure, a History and Physical was                        performed, and patient medications, allergies and                        sensitivities were reviewed. The patient's tolerance of                        previous anesthesia was reviewed.                       - The risks and benefits of the procedure and the                        sedation options and risks were discussed with the                        patient. All questions were answered and informed                        consent was obtained.                       - Patient identification and proposed procedure were                        verified prior to the procedure by the physician, the                        nurse, the anesthesiologist, the anesthetist and the                        technician. The procedure was verified in the procedure                        room.  After obtaining informed consent, the colonoscope was                        passed under direct vision. Throughout the procedure,                        the patient's blood pressure, pulse, and oxygen                        saturations were monitored continuously. The                     Colonoscope was introduced through the anus and                        advanced to the the cecum, identified by appendiceal                        orifice and ileocecal valve. The colonoscopy was                        performed with ease. The patient tolerated the                        procedure well. The quality of the bowel preparation                        was good. Findings:      The perianal and digital rectal examinations were normal.      A 4 mm polyp was found in the transverse colon. The polyp was sessile.       The polyp was removed with a cold biopsy forceps. Resection and       retrieval were complete.      The exam was otherwise without abnormality.      The rectum, sigmoid colon, descending colon, transverse colon, ascending       colon and cecum appeared normal. Biopsies for histology were taken with       a cold forceps from the entire colon for evaluation of microscopic       colitis.      The retroflexed view of the distal rectum and anal verge was normal and       showed no anal or rectal abnormalities. Impression:           - One 4 mm polyp in the transverse colon, removed with                        a cold biopsy forceps. Resected and retrieved.                       - The examination was otherwise normal.                       - The rectum, sigmoid colon, descending colon,                        transverse colon, ascending colon and cecum are normal.                        Biopsied.                       -  The distal rectum and anal verge are normal on                        retroflexion view. Recommendation:       - Discharge patient to home (with escort).                       - Advance diet as tolerated.                       - Continue present medications.                       - Await pathology results.                       - Repeat colonoscopy in 5 years.                       - The findings and recommendations were discussed with                         the patient.                       - The findings and recommendations were discussed with                        the patient's family.                       - Return to primary care physician as previously                        scheduled. Procedure Code(s):    --- Professional ---                       [phone], Colonoscopy, flexible; with biopsy, single or                        multiple Diagnosis Code(s):    --- Professional ---                       Z12.11, Encounter for screening for malignant neoplasm                        of colon                       K63.5, Polyp of colon CPT copyright 2019 American Medical Association. All rights reserved. The codes documented in this report are preliminary and upon coder review may  be revised to meet current compliance requirements.  [name], MD Margretta Sidle B. Bonna Gains MD, MD 09/04/2018 10:58:09 AM This report has been signed electronically. Number of Addenda: 0 Note Initiated On: 09/04/2018 9:54 AM Scope Withdrawal Time: 0 hours 19 minutes 52 seconds  Total Procedure Duration: 0 hours 33 minutes 11 seconds  Estimated Blood Loss: Estimated blood loss: none.      [facility]

## 2018-09-04 NOTE — Anesthesia Post-op Follow-up Note (Signed)
Anesthesia QCDR form completed.        

## 2018-09-04 NOTE — Op Note (Signed)
[facility]astroenterology Patient Name: Wanda Lang Procedure Date: 09/04/2018 9:55 AM MRN: [id number] Account #: [id number] Date of Birth: April 06, 1950 Admit Type: Outpatient Age: 68 Room: [facility] ENDO ROOM 2 Gender: Female Note Status: Finalized Procedure:            Upper GI endoscopy Indications:          Follow-up of gastric polyps Providers:            Kaito Schulenburg B. Bonna Gains MD, MD Medicines:            Monitored Anesthesia Care Complications:        No immediate complications. Procedure:            Pre-Anesthesia Assessment:                       - Prior to the procedure, a History and Physical was                        performed, and patient medications, allergies and                        sensitivities were reviewed. The patient's tolerance of                        previous anesthesia was reviewed.                       - The risks and benefits of the procedure and the                        sedation options and risks were discussed with the                        patient. All questions were answered and informed                        consent was obtained.                       - Patient identification and proposed procedure were                        verified prior to the procedure by the physician, the                        nurse, the anesthesiologist, the anesthetist and the                        technician. The procedure was verified in the procedure                        room.                       - ASA Grade Assessment: II - A patient with mild                        systemic disease.                       After obtaining informed consent, the endoscope was  passed under direct vision. Throughout the procedure,                        the patient's blood pressure, pulse, and oxygen                        saturations were monitored continuously. The Endoscope                        was introduced through the mouth, and  advanced to the                        second part of duodenum. The upper GI endoscopy was                        accomplished with ease. The patient tolerated the                        procedure well. Findings:      The examined esophagus was normal.      Patchy mildly erythematous mucosa without bleeding was found in the       gastric antrum. Biopsies were taken with a cold forceps for histology.       Biopsies were obtained in the gastric body, at the incisura and in the       gastric antrum with cold forceps for histology.      Multiple 2 to 5 mm sessile polyps with no bleeding and no stigmata of       recent bleeding were found in the gastric body. Biopsies were taken with       a cold forceps for histology.      Few non-bleeding duodenal ulcers with a clean ulcer base (Forrest Class       III) were found in the duodenal bulb. The largest lesion was 5 mm in       largest dimension.      The second portion of the duodenum was normal. Impression:           - Normal esophagus.                       - Erythematous mucosa in the antrum. Biopsied.                       - Multiple gastric polyps. Biopsied.                       - Non-bleeding duodenal ulcers with a clean ulcer base                        (Forrest Class III).                       - Normal second portion of the duodenum.                       - Biopsies were obtained in the gastric body, at the                        incisura and in the gastric antrum. Recommendation:       - Take prescribed proton pump inhibitor or H2 blocker                        (  antacid) medications 30 - 60 minutes before meals.                       - Avoid NSAIDs except Aspirin if medically indicated by                        PCP                       - Await pathology results.                       - Discharge patient to home (with escort).                       - Advance diet as tolerated.                       - Continue present medications.                        - Patient has a contact number available for                        emergencies. The signs and symptoms of potential                        delayed complications were discussed with the patient.                        Return to normal activities tomorrow. Written discharge                        instructions were provided to the patient.                       - Discharge patient to home (with escort).                       - The findings and recommendations were discussed with                        the patient.                       - The findings and recommendations were discussed with                        the patient's family. Procedure Code(s):    --- Professional ---                       [phone], Esophagogastroduodenoscopy, flexible, transoral;                        with biopsy, single or multiple Diagnosis Code(s):    --- Professional ---                       K31.89, Other diseases of stomach and duodenum                       K31.7, Polyp of stomach and duodenum  K26.9, Duodenal ulcer, unspecified as acute or chronic,                        without hemorrhage or perforation CPT copyright 2019 American Medical Association. All rights reserved. The codes documented in this report are preliminary and upon coder review may  be revised to meet current compliance requirements.  [name], MD Margretta Sidle B. Bonna Gains MD, MD 09/04/2018 10:14:38 AM This report has been signed electronically. Number of Addenda: 0 Note Initiated On: 09/04/2018 9:55 AM Estimated Blood Loss: Estimated blood loss: none.      [facility]

## 2018-09-05 ENCOUNTER — Encounter: Payer: Self-pay | Admitting: Gastroenterology

## 2018-09-05 NOTE — Anesthesia Postprocedure Evaluation (Signed)
Anesthesia Post Note  Patient: Wanda Lang  Procedure(s) Performed: ESOPHAGOGASTRODUODENOSCOPY (EGD) WITH PROPOFOL (N/A ) COLONOSCOPY WITH PROPOFOL (N/A )  Patient location during evaluation: Endoscopy Anesthesia Type: General Level of consciousness: awake and alert Pain management: pain level controlled Vital Signs Assessment: post-procedure vital signs reviewed and stable Respiratory status: spontaneous breathing and respiratory function stable Cardiovascular status: stable Anesthetic complications: no     Last Vitals:  Vitals:   09/04/18 1105 09/04/18 1115  BP: 117/81 117/89  Pulse: 88 82  Resp: 17 16  Temp:    SpO2: 98% 97%    Last Pain:  Vitals:   09/04/18 1115  TempSrc:   PainSc: 0-No pain                 KEPHART,WILLIAM K

## 2018-09-08 ENCOUNTER — Telehealth: Payer: Self-pay | Admitting: Gastroenterology

## 2018-09-08 NOTE — Telephone Encounter (Signed)
Pt left vm she states she had a procedure with  Dr. Bonna Gains yesterday and has post procedures  instructions and has questions about them please call pt

## 2018-09-08 NOTE — Telephone Encounter (Signed)
Pt has concerns of instructions on her discharge paper from hospital. It was just "generic" per patient. She did want to know who to complain about it too and I gave her the Endo unit number.  She wanted to clarify frequency of her protonix, and does she continue to take her Celebrex and can she take sodium bicorbanate for stomach discomfort in the evening. Also she questioned why if she is to have a colonoscopy in 5 yrs., why not an EGD. I informed pt she was most likely waiting on results (pathology) to determine next step. Please advise.

## 2018-09-09 ENCOUNTER — Telehealth: Payer: Self-pay

## 2018-09-09 LAB — SURGICAL PATHOLOGY

## 2018-09-09 NOTE — Telephone Encounter (Addendum)
Pt.notified

## 2018-09-09 NOTE — Telephone Encounter (Signed)
-----   Message from Virgel Manifold, MD sent at 09/09/2018  1:48 PM EDT ----- Wanda Lang please let patient know, her colon polyp was benign but precancerous and was removed. Repeat Colonoscopy in 5 yrs. Set recall  Stomach polyps were benign polyps and there was no infection seen on biopsies of the stomach. She should take her PPI. And Follow up in clinic in 3-6 months so we can discuss how she is doing and if an upper endoscopy would be needed in the future due to her finding of ulcers in the duodenum. Take her PPI as prescribed.

## 2018-09-09 NOTE — Telephone Encounter (Signed)
Pt notified of results of colonoscopy/EGD. She does not care to her PPI for the next 3-6 months and wanting to talk with Dr. Bonna Gains, that she should know her history with PPI's. I offered a televisit or office visit but at this time pt would like to think so more about it. I did inform her of Dr. Bonna Gains advise of continuing to take her medications as instructed on her discharge summary. I also informed pt that she could take the sodium bicorbonate in the evening, but not to take it routinely per Dr. Bonna Gains.  Pt also states she continues to have a low grade fever of 99.5 orally and that I would let Dr. Bonna Gains know.

## 2018-09-10 DIAGNOSIS — Z79899 Other long term (current) drug therapy: Secondary | ICD-10-CM | POA: Diagnosis not present

## 2018-09-10 DIAGNOSIS — M25569 Pain in unspecified knee: Secondary | ICD-10-CM | POA: Diagnosis not present

## 2018-09-10 DIAGNOSIS — M7050 Other bursitis of knee, unspecified knee: Secondary | ICD-10-CM | POA: Diagnosis not present

## 2018-09-10 DIAGNOSIS — M549 Dorsalgia, unspecified: Secondary | ICD-10-CM | POA: Diagnosis not present

## 2018-09-10 DIAGNOSIS — M0589 Other rheumatoid arthritis with rheumatoid factor of multiple sites: Secondary | ICD-10-CM | POA: Diagnosis not present

## 2018-09-10 DIAGNOSIS — M150 Primary generalized (osteo)arthritis: Secondary | ICD-10-CM | POA: Diagnosis not present

## 2018-09-10 NOTE — Telephone Encounter (Signed)
MyChart message has been sent to pt

## 2018-09-22 DIAGNOSIS — M0589 Other rheumatoid arthritis with rheumatoid factor of multiple sites: Secondary | ICD-10-CM | POA: Diagnosis not present

## 2018-10-20 DIAGNOSIS — M0589 Other rheumatoid arthritis with rheumatoid factor of multiple sites: Secondary | ICD-10-CM | POA: Diagnosis not present

## 2018-11-04 DIAGNOSIS — Z23 Encounter for immunization: Secondary | ICD-10-CM | POA: Diagnosis not present

## 2018-11-17 DIAGNOSIS — M0589 Other rheumatoid arthritis with rheumatoid factor of multiple sites: Secondary | ICD-10-CM | POA: Diagnosis not present

## 2018-12-15 DIAGNOSIS — M0589 Other rheumatoid arthritis with rheumatoid factor of multiple sites: Secondary | ICD-10-CM | POA: Diagnosis not present

## 2018-12-16 DIAGNOSIS — M179 Osteoarthritis of knee, unspecified: Secondary | ICD-10-CM | POA: Diagnosis not present

## 2018-12-16 DIAGNOSIS — M150 Primary generalized (osteo)arthritis: Secondary | ICD-10-CM | POA: Diagnosis not present

## 2018-12-16 DIAGNOSIS — M25569 Pain in unspecified knee: Secondary | ICD-10-CM | POA: Diagnosis not present

## 2018-12-16 DIAGNOSIS — M7050 Other bursitis of knee, unspecified knee: Secondary | ICD-10-CM | POA: Diagnosis not present

## 2018-12-16 DIAGNOSIS — M1710 Unilateral primary osteoarthritis, unspecified knee: Secondary | ICD-10-CM | POA: Diagnosis not present

## 2018-12-16 DIAGNOSIS — M0589 Other rheumatoid arthritis with rheumatoid factor of multiple sites: Secondary | ICD-10-CM | POA: Diagnosis not present

## 2018-12-16 DIAGNOSIS — M549 Dorsalgia, unspecified: Secondary | ICD-10-CM | POA: Diagnosis not present

## 2018-12-16 DIAGNOSIS — Z79899 Other long term (current) drug therapy: Secondary | ICD-10-CM | POA: Diagnosis not present

## 2018-12-16 DIAGNOSIS — J302 Other seasonal allergic rhinitis: Secondary | ICD-10-CM | POA: Diagnosis not present

## 2019-01-07 DIAGNOSIS — J302 Other seasonal allergic rhinitis: Secondary | ICD-10-CM | POA: Diagnosis not present

## 2019-01-14 DIAGNOSIS — M0589 Other rheumatoid arthritis with rheumatoid factor of multiple sites: Secondary | ICD-10-CM | POA: Diagnosis not present

## 2019-02-03 ENCOUNTER — Other Ambulatory Visit: Payer: Self-pay

## 2019-02-03 ENCOUNTER — Ambulatory Visit (INDEPENDENT_AMBULATORY_CARE_PROVIDER_SITE_OTHER): Payer: Medicare Other

## 2019-02-03 VITALS — Ht 64.0 in | Wt 249.0 lb

## 2019-02-03 DIAGNOSIS — Z0000 Encounter for general adult medical examination without abnormal findings: Secondary | ICD-10-CM

## 2019-02-03 NOTE — Progress Notes (Signed)
Subjective:   Wanda Lang is a 68 y.o. female who presents for an Initial Medicare Annual Wellness Visit.  Review of Systems    No ROS.  Medicare Wellness Virtual Visit.  Visual/audio telehealth visit, UTA vital signs.   Wt/Ht provided by patient.  See social history for additional risk factors.    Cardiac Risk Factors include: advanced age (>60men, >65 women);hypertension     Objective:    Today's Vitals   02/03/19 0939  Weight: 249 lb (112.9 kg)  Height: 5\' 4"  (1.626 m)   Body mass index is 42.74 kg/m.  Advanced Directives 02/03/2019 09/04/2018  Does Patient Have a Medical Advance Directive? Yes Yes  Type of Paramedic of [place];Living will [place]  Does patient want to make changes to medical advance directive? No - Patient declined -  Copy of [place] in Chart? No - copy requested -    Current Medications (verified) Outpatient Encounter Medications as of 02/03/2019  Medication Sig  . Abatacept (ORENCIA) 125 MG/ML SOSY Inject into the vein.  Marland Kitchen acetaminophen (TYLENOL) 500 MG tablet Take 500 mg by mouth as needed.  . calcium carbonate 1250 MG capsule Take by mouth.  . celecoxib (CELEBREX) 200 MG capsule Take 200 mg by mouth daily.  . Coenzyme Q10 (COQ10) 200 MG CAPS Take 200 mg by mouth daily.  . Collagen 500 MG CAPS Take by mouth.  . cyclobenzaprine (FLEXERIL) 5 MG tablet Take 5 mg by mouth as needed.  . cycloSPORINE (RESTASIS) 0.05 % ophthalmic emulsion Apply to eye.  . Estradiol (VAGIFEM) 10 MCG TABS vaginal tablet Place vaginally.  Marland Kitchen estradiol (VIVELLE-DOT) 0.05 MG/24HR patch Place 1 patch onto the skin 2 (two) times a week.  . Flaxseed, Linseed, (FLAXSEED OIL) 1000 MG CAPS Take by mouth.  . fluticasone (FLONASE) 50 MCG/ACT nasal spray Place 2 sprays into both nostrils daily.  . Glucosamine-Chondroitin 250-200 MG CAPS Take by mouth.  . Loratadine 10 MG CAPS Take 10 mg by mouth as needed.  .  Magnesium 100 MG CAPS Take by mouth.  . milk thistle 175 MG tablet Take 175 mg by mouth daily.  . Potassium 99 MG TABS Take 99 mg by mouth 2 (two) times daily.  . predniSONE (DELTASONE) 10 MG tablet 20 mg. For sinus infection  . selenium 50 MCG TABS tablet Take 50 mcg by mouth daily.  . Vitamin D, Ergocalciferol, (DRISDOL) 50000 units CAPS capsule Take 50,000 Units by mouth every 14 (fourteen) days.  Marland Kitchen ZINC MT Use as directed in the mouth or throat.  . pantoprazole (PROTONIX) 40 MG tablet Take 1 tablet (40 mg total) by mouth daily.  . [DISCONTINUED] doxycycline (VIBRA-TABS) 100 MG tablet Take 1 tablet (100 mg total) by mouth 2 (two) times daily. (Patient not taking: Reported on 09/04/2018)   No facility-administered encounter medications on file as of 02/03/2019.    Allergies (verified) Penicillins, Amoxicillin, Sulfonamide derivatives, and Ylang-ylang [cananga oil (ylang-ylang)]   History: Past Medical History:  Diagnosis Date  . Allergy   . Arthritis   . Chronic kidney disease   . GERD (gastroesophageal reflux disease)   . Hypertension   . LIVER FUNCTION TESTS, ABNORMAL, HX OF 09/08/2007   Qualifier: Diagnosis of  By: Harlon Ditty CMA (AAMA), Dottie     Past Surgical History:  Procedure Laterality Date  . ABDOMINAL HYSTERECTOMY    . COLONOSCOPY WITH PROPOFOL N/A 09/04/2018   Procedure: COLONOSCOPY WITH PROPOFOL;  Surgeon: Virgel Manifold,  MD;  Location: [facility] ENDOSCOPY;  Service: Endoscopy;  Laterality: N/A;  . ESOPHAGOGASTRODUODENOSCOPY (EGD) WITH PROPOFOL N/A 09/04/2018   Procedure: ESOPHAGOGASTRODUODENOSCOPY (EGD) WITH PROPOFOL;  Surgeon: Virgel Manifold, MD;  Location: [facility] ENDOSCOPY;  Service: Endoscopy;  Laterality: N/A;  . LAPAROSCOPY    . TURBINATE REDUCTION     Family History  Problem Relation Age of Onset  . Alcohol abuse Mother   . Arthritis Mother   . Hyperlipidemia Mother   . Heart disease Mother   . Stroke Mother   . Hypertension Mother   . Depression  Mother   . Anxiety disorder Mother   . Arthritis Father   . Lung cancer Paternal Grandfather   . Kidney disease Paternal Grandfather    Social History   Socioeconomic History  . Marital status: Married    Spouse name: Not on file  . Number of children: Not on file  . Years of education: Not on file  . Highest education level: Not on file  Occupational History  . Not on file  Tobacco Use  . Smoking status: Never Smoker  . Smokeless tobacco: Never Used  Substance and Sexual Activity  . Alcohol use: Yes    Alcohol/week: 1.0 standard drinks    Types: 1 Glasses of wine per week  . Drug use: No  . Sexual activity: Never  Other Topics Concern  . Not on file  Social History Narrative  . Not on file   Social Determinants of Health   Financial Resource Strain:   . Difficulty of Paying Living Expenses: Not on file  Food Insecurity:   . Worried About Charity fundraiser in the Last Year: Not on file  . Ran Out of Food in the Last Year: Not on file  Transportation Needs:   . Lack of Transportation (Medical): Not on file  . Lack of Transportation (Non-Medical): Not on file  Physical Activity:   . Days of Exercise per Week: Not on file  . Minutes of Exercise per Session: Not on file  Stress:   . Feeling of Stress : Not on file  Social Connections:   . Frequency of Communication with Friends and Family: Not on file  . Frequency of Social Gatherings with Friends and Family: Not on file  . Attends Religious Services: Not on file  . Active Member of Clubs or Organizations: Not on file  . Attends Archivist Meetings: Not on file  . Marital Status: Not on file    Tobacco Counseling Counseling given: Not Answered   Clinical Intake:  Pre-visit preparation completed: Yes        Diabetes: No  How often do you need to have someone help you when you read instructions, pamphlets, or other written materials from your doctor or pharmacy?: 1 - Never  Interpreter  Needed?: No      Activities of Daily Living In your present state of health, do you have any difficulty performing the following activities: 02/03/2019  Hearing? N  Vision? N  Difficulty concentrating or making decisions? N  Walking or climbing stairs? Y  Comment Bursitis L knee, OA  Dressing or bathing? N  Doing errands, shopping? N  Preparing Food and eating ? N  Using the Toilet? N  In the past six months, have you accidently leaked urine? N  Do you have problems with loss of bowel control? N  Managing your Medications? N  Managing your Finances? N  Housekeeping or managing your Housekeeping? Y  Comment  Husband  Some recent data might be hidden     Immunizations and Health Maintenance Immunization History  Administered Date(s) Administered  . Hep A / Hep B 08/22/2017, 09/24/2017, 02/26/2018  . Influenza Inj Mdck Quad Pf 01/02/2016   Health Maintenance Due  Topic Date Due  . TETANUS/TDAP  11/22/1969  . PNA vac Low Risk Adult (1 of 2 - PCV13) 11/23/2015  . MAMMOGRAM  06/07/2018  . INFLUENZA VACCINE  09/06/2018    Patient Care Team: Leone Haven, MD as PCP - General (Family Medicine)  Indicate any recent Medical Services you may have received from other than [facility] providers in the past year (date may be approximate).     Assessment:   This is a routine wellness examination for Yolette.  Nurse connected with patient 02/03/19 at  9:30 AM EST by a telephone enabled telemedicine application and verified that I am speaking with the correct person using two identifiers. Patient stated full name and DOB. Patient gave permission to continue with virtual visit. Patient's location was at home and Nurse's location was at [place] office.   Patient is alert and oriented x3. Patient denies difficulty focusing or concentrating. Patient likes to read, watch tv, research/writing, increase crossword puzzles and sodoku for brain stimulation.   Health Maintenance Due: -PNA  vaccine- discussed; patient states previously given by rheumatologist.  -Tdap vaccine- discussed; to be completed with doctor in visit or local pharmacy.   -Shingles vaccine- discussed; patient states 1st dose completed and awaiting 2nd dose, [place].   -Mammogram- patient states last completed 07/2018, Dr. Humphrey Rolls, [facility] [place]. See completed HM at the end of note.   Eye: Visual acuity not assessed. Virtual visit. Followed by their ophthalmologist.  Dental: Visits every 6 months.    Hearing: Demonstrates normal hearing during visit.  Safety:  Patient feels safe at home- yes Patient does have smoke detectors at home- yes Patient does wear sunscreen or protective clothing when in direct sunlight - yes Patient does wear seat belt when in a moving vehicle - yes Patient drives- yes Adequate lighting in walkways free from debris- yes Grab bars and handrails used as appropriate- yes Ambulates with an assistive device- no Cell phone on person when ambulating outside of the home- yes  Social: Alcohol intake - yes , occasionally    Smoking history- never   Smokers in home? none Illicit drug use? none  Medication: Taking as directed and without issues.  Pill box in use -yes  Self managed - yes   Covid-19: Precautions and sickness symptoms discussed. Wears mask, social distancing, hand hygiene as appropriate.   Activities of Daily Living Patient denies needing assistance with: feeding themselves, getting from bed to chair, getting to the toilet, bathing/showering, dressing, managing money, or preparing meals.  Assisted by husband with household chores as needed.   Discussed the importance of a healthy diet, water intake and the benefits of aerobic exercise.  Educational material provided.  Physical activity- no routine  Diet:  Regular Water: good intake  Caffeine: none  Other Providers Patient Care Team: Leone Haven, MD as PCP -  General (Family Medicine)   Hearing/Vision screen  Hearing Screening   125Hz  250Hz  500Hz  1000Hz  2000Hz  3000Hz  4000Hz  6000Hz  8000Hz   Right ear:           Left ear:           Comments: Patient is able to hear conversational tones without difficulty.  No issues reported.  Vision  Screening Comments: Wears corrective lenses Visual acuity not assessed, virtual visit.  They have seen their ophthalmologist in the last 12 months.     Dietary issues and exercise activities discussed: Current Exercise Habits: The patient does not participate in regular exercise at present  Goals    . Increase physical activity     As tolerated       Depression Screen PHQ 2/9 Scores 02/03/2019 02/03/2018 11/02/2016  PHQ - 2 Score 0 0 0    Fall Risk Fall Risk  02/03/2019 02/03/2018 11/02/2016  Falls in the past year? 0 0 No  Number falls in past yr: - 0 -  Injury with Fall? - 0 -  Follow up Falls prevention discussed - -   Timed Get Up and Go Performed no, virtual visit  Cognitive Function:     6CIT Screen 02/03/2019  What Year? 0 points  What month? 0 points  What time? 0 points  Count back from 20 0 points  Months in reverse 0 points  Repeat phrase 0 points  Total Score 0    Screening Tests Health Maintenance  Topic Date Due  . TETANUS/TDAP  11/22/1969  . PNA vac Low Risk Adult (1 of 2 - PCV13) 11/23/2015  . MAMMOGRAM  06/07/2018  . INFLUENZA VACCINE  09/06/2018  . COLONOSCOPY  09/03/2028  . DEXA SCAN  Completed  . Hepatitis C Screening  Completed     Plan:   Keep all routine maintenance appointments.  Agrees to provide a copy of vaccinations and mammogram report to the office.    Medicare Attestation I have personally reviewed: The patient's medical and social history Their use of alcohol, tobacco or illicit drugs Their current medications and supplements The patient's functional ability including ADLs,fall risks, home safety risks, cognitive, and hearing and visual  impairment Diet and physical activities Evidence for depression   I have reviewed and discussed with patient certain preventive protocols, quality metrics, and best practice recommendations.     Varney Biles, LPN   [id number]

## 2019-02-03 NOTE — Patient Instructions (Addendum)
  [name] , Thank you for taking time to come for your Medicare Wellness Visit. I appreciate your ongoing commitment to your health goals. Please review the following plan we discussed and let me know if I can assist you in the future.   These are the goals we discussed: Goals    . Increase physical activity     As tolerated        This is a list of the screening recommended for you and due dates:  Health Maintenance  Topic Date Due  . Tetanus Vaccine  11/22/1969  . Pneumonia vaccines (1 of 2 - PCV13) 11/23/2015  . Mammogram  06/07/2018  . Flu Shot  09/06/2018  . Colon Cancer Screening  09/03/2028  . DEXA scan (bone density measurement)  Completed  .  Hepatitis C: One time screening is recommended by Center for Disease Control  (CDC) for  adults born from [birth year] through 1965.   Completed

## 2019-02-05 ENCOUNTER — Ambulatory Visit: Payer: Medicare Other

## 2019-02-09 ENCOUNTER — Ambulatory Visit: Payer: Medicare Other

## 2019-02-25 ENCOUNTER — Telehealth: Payer: Self-pay | Admitting: Family Medicine

## 2019-02-25 NOTE — Telephone Encounter (Signed)
Pt wants to know if she should get her shingles vaccination since she is considering the covid vaccine as well. Pleas advise-

## 2019-02-25 NOTE — Telephone Encounter (Signed)
Pt wants to know if she should get her shingles vaccination since she is considering the covid vaccine as well. Pleas advise- Wanda Lang,cma

## 2019-02-26 NOTE — Telephone Encounter (Signed)
Please let the patient know that she could get the shingles vaccine though it would need to be separated by at least 14 days prior to or after the COVID-19 vaccine.  Thanks.

## 2019-02-26 NOTE — Telephone Encounter (Signed)
No known issues, but current CDC interim clinical considerations say to separate COVID vaccine from other vaccines by 14 days:  "Given the lack of data on the safety and efficacy of mRNA COVID-19 vaccines administered simultaneously with other vaccines, the vaccine series should routinely be administered alone, with a minimum interval of 14 days before or after administration with any other vaccine. If mRNA COVID-19 vaccines are administered within 14 days of another vaccine, doses do not need to be repeated for either vaccine"

## 2019-02-26 NOTE — Telephone Encounter (Signed)
I called and spoke with the patient's husbancd and I informed him to let the patient know that she needs to have 14 days between the shingles and th ecovid vaccine either before or after.  He stated he will let her know.  Gray Doering,cma

## 2019-04-02 ENCOUNTER — Ambulatory Visit: Payer: Medicare Other | Attending: Internal Medicine

## 2019-04-02 DIAGNOSIS — Z23 Encounter for immunization: Secondary | ICD-10-CM | POA: Insufficient documentation

## 2019-04-02 NOTE — Progress Notes (Signed)
   Covid-19 Vaccination Clinic  Name:  Wanda Lang    MRN: [id number] DOB: 05/13/1950  04/02/2019  [name] was observed post Covid-19 immunization for 15 minutes without incidence. She was provided with Vaccine Information Sheet and instruction to access the V-Safe system.   [name] was instructed to call 911 with any severe reactions post vaccine: Marland Kitchen Difficulty breathing  . Swelling of your face and throat  . A fast heartbeat  . A bad rash all over your body  . Dizziness and weakness    Immunizations Administered    Name Date Dose VIS Date Route   Pfizer COVID-19 Vaccine 04/02/2019 10:02 AM 0.3 mL 01/16/2019 Intramuscular   Manufacturer: [place]   Lot: [id number]   [place]: [id number]

## 2019-04-28 ENCOUNTER — Ambulatory Visit: Payer: Medicare Other | Attending: Internal Medicine

## 2019-04-28 DIAGNOSIS — Z23 Encounter for immunization: Secondary | ICD-10-CM

## 2019-04-28 NOTE — Progress Notes (Signed)
   Covid-19 Vaccination Clinic  Name:  Wanda Lang    MRN: [id number] DOB: 07-27-1950  04/28/2019  [name] was observed post Covid-19 immunization for 30 minutes based on pre-vaccination screening without incident. She was provided with Vaccine Information Sheet and instruction to access the V-Safe system.   [name] was instructed to call 911 with any severe reactions post vaccine: Marland Kitchen Difficulty breathing  . Swelling of face and throat  . A fast heartbeat  . A bad rash all over body  . Dizziness and weakness   Immunizations Administered    Name Date Dose VIS Date Route   Pfizer COVID-19 Vaccine 04/28/2019  9:15 AM 0.3 mL 01/16/2019 Intramuscular   Manufacturer: [place]   Lot: [id number]   [place]: [id number]

## 2019-07-14 IMAGING — US US ABDOMEN LIMITED
1 series · 14 of 25 positions shown · non-contrast
Comparison: None.

CLINICAL DATA: Elevated liver function tests and alkaline
phosphatase

EXAM:
ULTRASOUND ABDOMEN LIMITED RIGHT UPPER QUADRANT

[Series 1: us abdomen limited · 0.23mm/px · 14 of 54 slices shown]
[im 1/54]
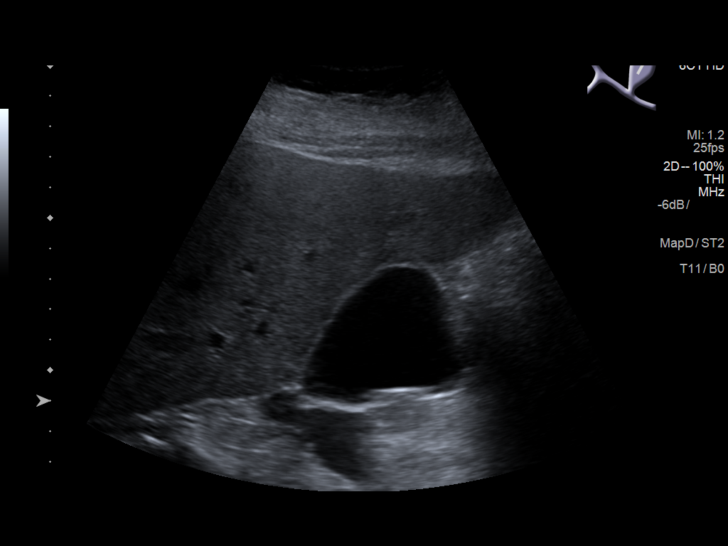
[im 5/54]
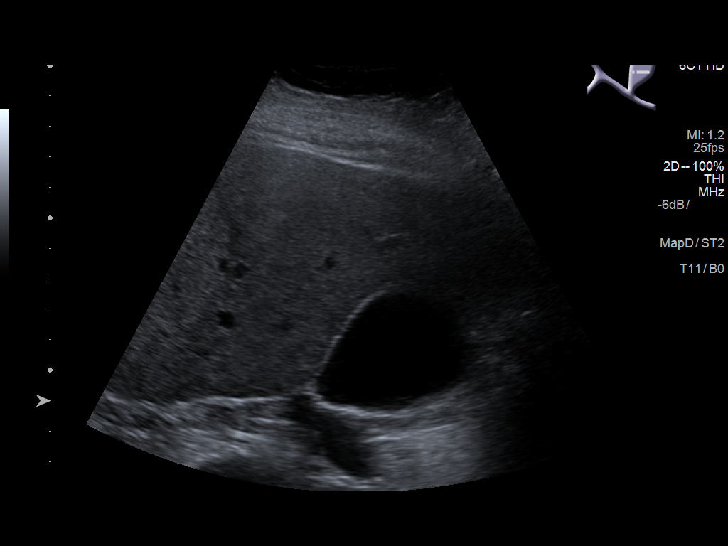
[im 9/54]
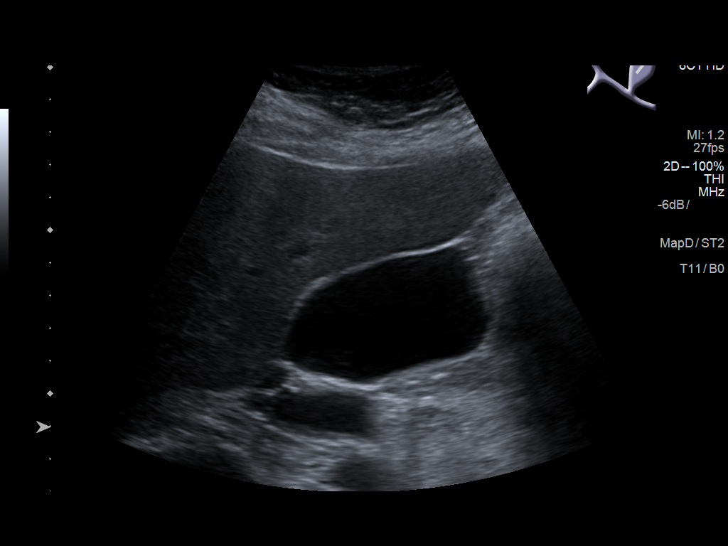
[im 14/54]
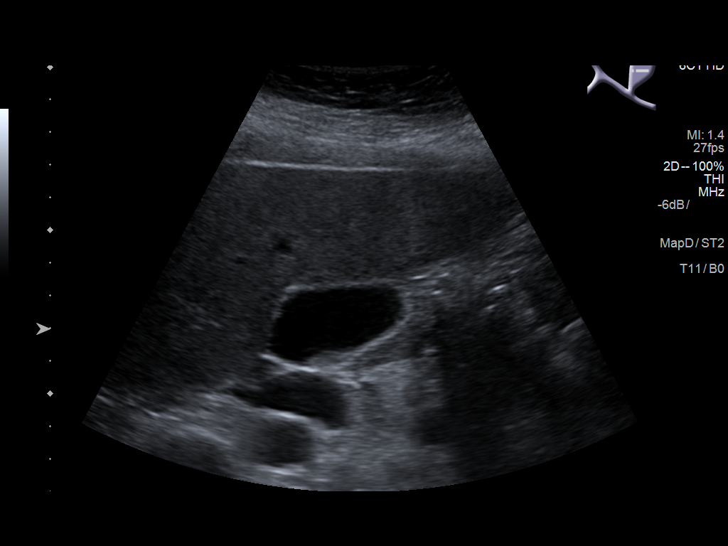
[im 18/54]
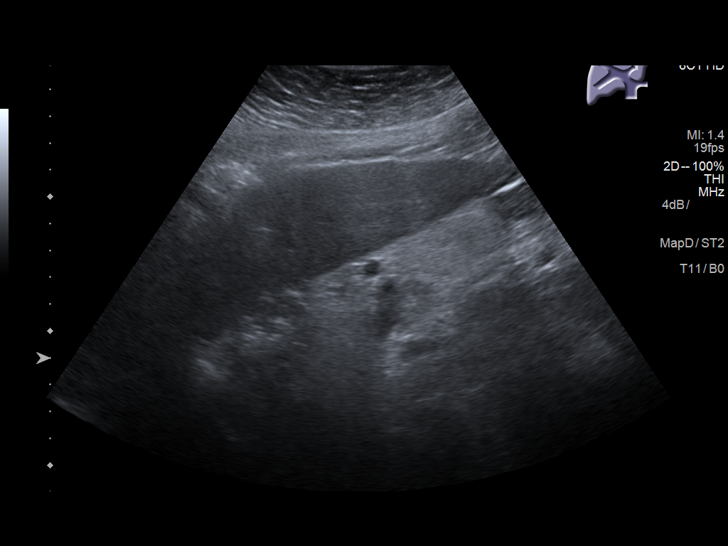
[im 20/54]
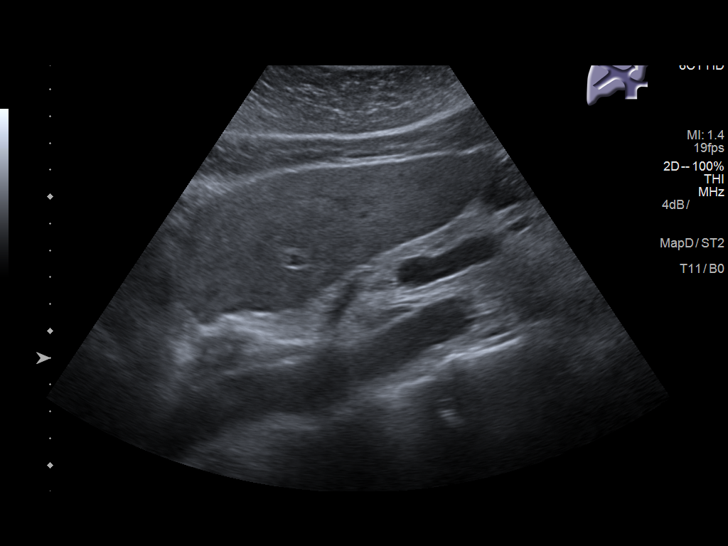
[im 25/54]
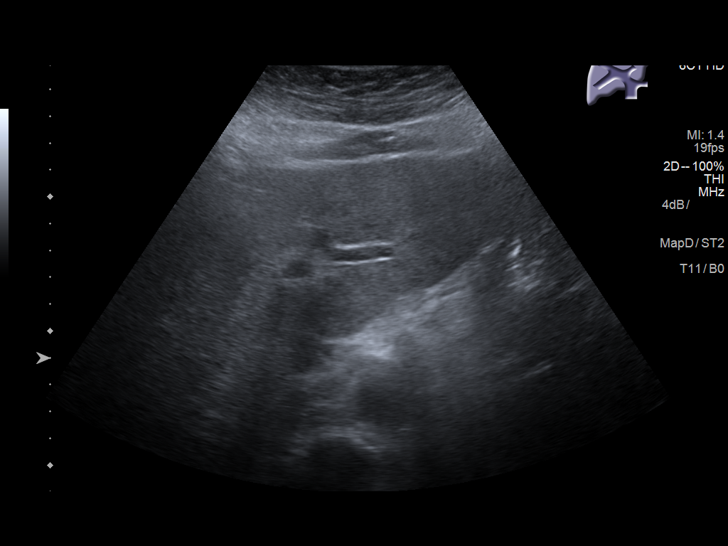
[im 29/54]
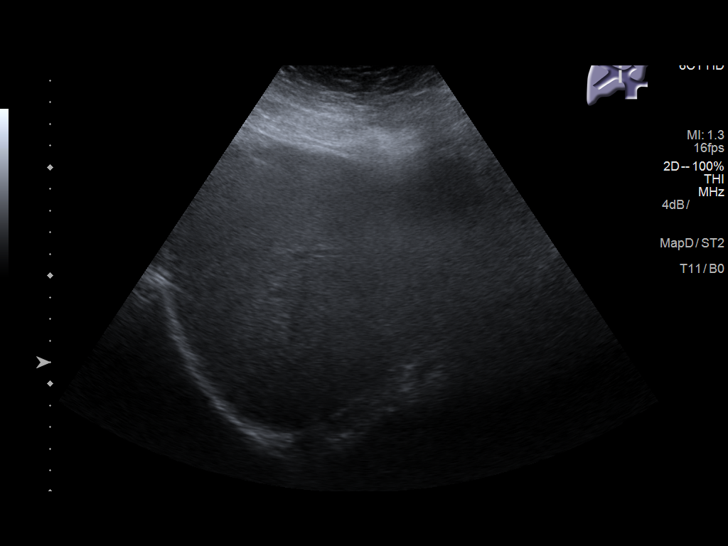
[im 34/54]
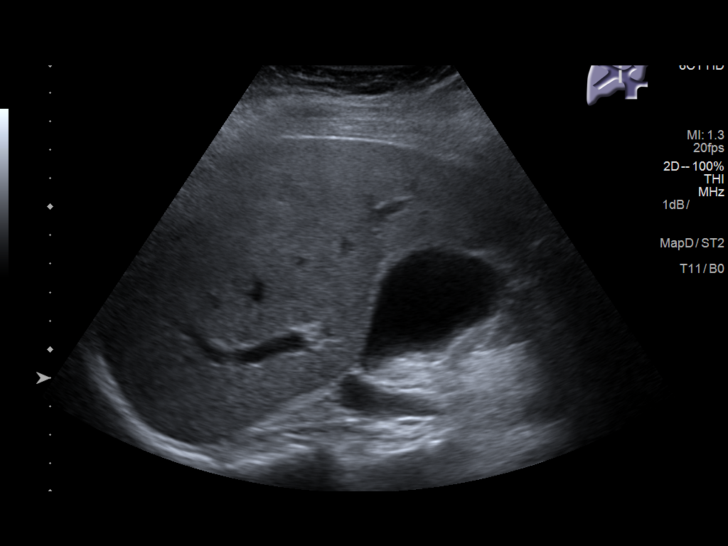
[im 36/54]
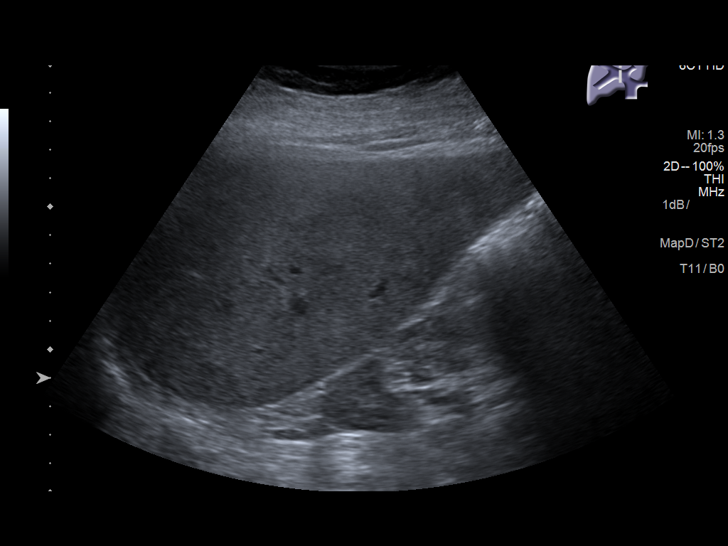
[im 40/54]
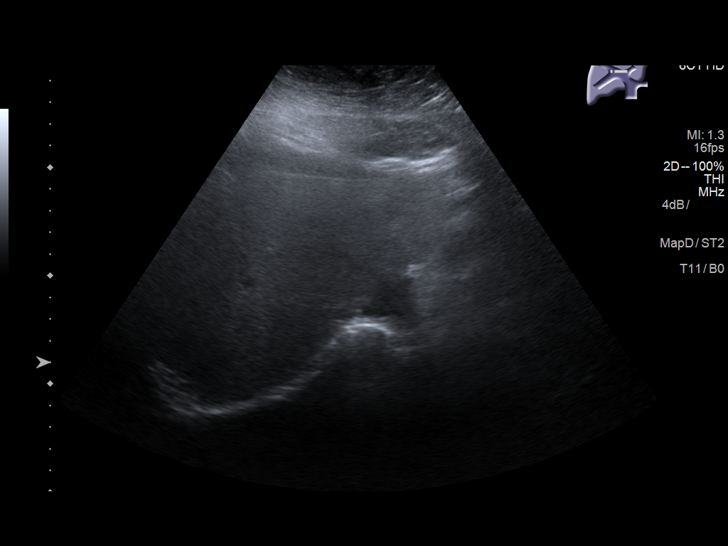
[im 45/54]
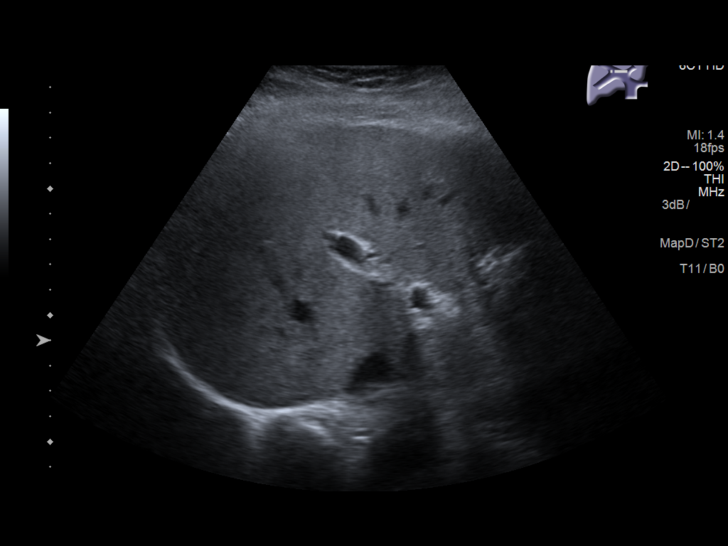
[im 49/54]
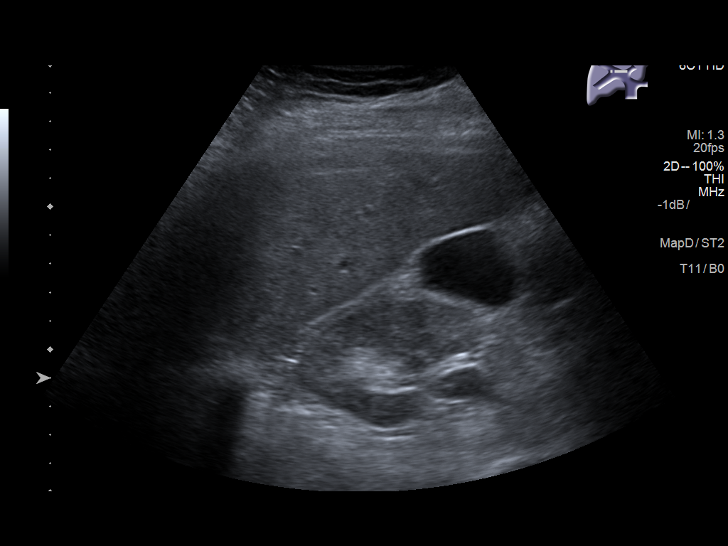
[im 54/54]
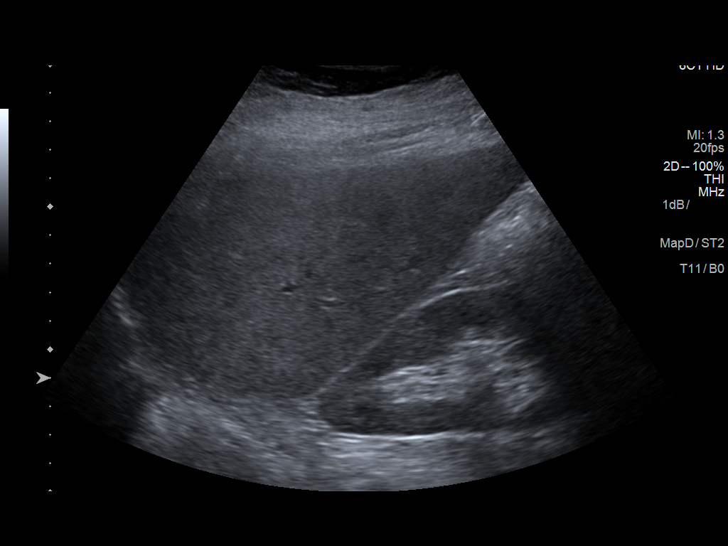

[14 of 25 positions shown; findings below may reference images not displayed]

FINDINGS: Gallbladder:

The gallbladder is well visualized and no gallstones are noted.
There is no pain over the gallbladder with compression.

Common bile duct:

Diameter: The common bile duct is normal measuring 2.5 mm in
diameter.

Liver:

There is slightly echogenic liver parenchyma present which may
suggest mild hepatic steatosis. A single echogenic focus in the
right lobe probably represents a hepatic granuloma. No ductal
dilatation is seen. Portal venous flow is patent and in the normal
direction.
IMPRESSION: 1. Suspect mild hepatic steatosis.  No focal abnormality.
2. No gallstones.

## 2019-07-30 DIAGNOSIS — M25562 Pain in left knee: Secondary | ICD-10-CM | POA: Insufficient documentation

## 2019-08-17 ENCOUNTER — Telehealth: Payer: Self-pay | Admitting: Family Medicine

## 2019-08-17 NOTE — Telephone Encounter (Signed)
LVM for the patient to call to schedule a lab appointment and f/up with provider.  [name],cma

## 2019-08-17 NOTE — Telephone Encounter (Signed)
Please call the patient. I received a reminder from Epic that she was supposed to have labs last year to follow-up on her liver enzymes though never had these drawn. She is also overdue for follow-up. Please get her scheduled for a visit if she is still coming to see me as her PCP. Thanks.

## 2019-09-03 NOTE — Telephone Encounter (Signed)
I called the patient and she stated that her rheumatology provider had her on medication that increased her liver enzymes and she has since been taken off of that medication and her liver enzymes is back to normal. She stated she is calling the office today for them to fax over those results because she is not with [facility] and she wants you to review and if you still want to see her or do labs she will be ok with that you are still her PCP.  Annica Marinello,cma

## 2019-09-04 NOTE — Telephone Encounter (Signed)
Noted. We will await labs from her rheumatologist.

## 2019-10-28 ENCOUNTER — Other Ambulatory Visit: Payer: Self-pay | Admitting: Family Medicine

## 2019-10-30 ENCOUNTER — Telehealth: Payer: Self-pay | Admitting: Family Medicine

## 2019-10-30 NOTE — Telephone Encounter (Signed)
Patient called in stated that she wanted Dr.Sonnenberg to write a prescription for Hydrochlorothiazide.

## 2019-10-30 NOTE — Telephone Encounter (Signed)
I called the patient and asked her why she needed the HCTZ and she stated she has always taken it PRN and she is noticing a little swelling in her ankles and she is out of the medication.  She wants a refill.  Sola Margolis,cma

## 2019-10-31 NOTE — Telephone Encounter (Signed)
I have not seen this patient since February 2020.  She needs to have an appointment to consider this being refilled.  Please get her scheduled.  Thanks.

## 2019-11-03 NOTE — Telephone Encounter (Signed)
I called and informed the patient that the provider stated she would need to be seen for refills and she scheduled a visit to see the provider on 11/06/19.  Leodan Bolyard,cma

## 2019-11-06 ENCOUNTER — Encounter: Payer: Self-pay | Admitting: Family Medicine

## 2019-11-06 ENCOUNTER — Ambulatory Visit (INDEPENDENT_AMBULATORY_CARE_PROVIDER_SITE_OTHER): Payer: Medicare Other | Admitting: Family Medicine

## 2019-11-06 ENCOUNTER — Other Ambulatory Visit: Payer: Self-pay

## 2019-11-06 ENCOUNTER — Telehealth: Payer: Self-pay

## 2019-11-06 VITALS — BP 118/70 | HR 96 | Temp 97.6°F | Ht 64.0 in | Wt 255.4 lb

## 2019-11-06 DIAGNOSIS — K269 Duodenal ulcer, unspecified as acute or chronic, without hemorrhage or perforation: Secondary | ICD-10-CM

## 2019-11-06 DIAGNOSIS — R7309 Other abnormal glucose: Secondary | ICD-10-CM

## 2019-11-06 DIAGNOSIS — I10 Essential (primary) hypertension: Secondary | ICD-10-CM | POA: Diagnosis not present

## 2019-11-06 DIAGNOSIS — F439 Reaction to severe stress, unspecified: Secondary | ICD-10-CM

## 2019-11-06 NOTE — Patient Instructions (Signed)
Nice to see you. We will get lab work on you today and contact you with the results. Please start on the Pepcid over-the-counter twice daily 30 minutes before breakfast and dinner. Please try to exercise as you are able to. Please cut down on stress eating.

## 2019-11-07 LAB — COMPREHENSIVE METABOLIC PANEL
AG Ratio: 1.6 (calc) (ref 1.0–2.5)
ALT: 12 U/L (ref 6–29)
AST: 16 U/L (ref 10–35)
Albumin: 4.2 g/dL (ref 3.6–5.1)
Alkaline phosphatase (APISO): 88 U/L (ref 37–153)
BUN: 20 mg/dL (ref 7–25)
CO2: 28 mmol/L (ref 20–32)
Calcium: 9.2 mg/dL (ref 8.6–10.4)
Chloride: 103 mmol/L (ref 98–110)
Creat: 0.74 mg/dL (ref 0.50–0.99)
Globulin: 2.7 g/dL (calc) (ref 1.9–3.7)
Glucose, Bld: 117 mg/dL — ABNORMAL HIGH (ref 65–99)
Potassium: 4.4 mmol/L (ref 3.5–5.3)
Sodium: 140 mmol/L (ref 135–146)
Total Bilirubin: 0.4 mg/dL (ref 0.2–1.2)
Total Protein: 6.9 g/dL (ref 6.1–8.1)

## 2019-11-07 LAB — LIPID PANEL
Cholesterol: 184 mg/dL (ref <200)
HDL: 56 mg/dL (ref >=50)
LDL Cholesterol (Calc): 108 mg/dL (calc) — ABNORMAL HIGH
Non-HDL Cholesterol (Calc): 128 mg/dL (calc) (ref <130)
Total CHOL/HDL Ratio: 3.3 (calc) (ref <5.0)
Triglycerides: 103 mg/dL (ref <150)

## 2019-11-07 LAB — HEMOGLOBIN A1C
Hgb A1c MFr Bld: 5.6 % of total Hgb (ref <5.7)
Mean Plasma Glucose: 114 (calc)
eAG (mmol/L): 6.3 (calc)

## 2019-11-07 NOTE — Telephone Encounter (Signed)
I informed the patient that the provider was waiting on the lab test before sending the HCTZ.  Adebayo Ensminger,cma

## 2019-11-09 NOTE — Telephone Encounter (Addendum)
Based on her well-controlled blood pressure at her visit she does not have an indication for HCTZ at this time.  I do not prescribe HCTZ just for swelling as the risk of the medication outweighs any benefit.  Is she still having swelling?

## 2019-11-09 NOTE — Telephone Encounter (Signed)
I called and spoke with the patient and she stated she is having some swelling at this time and she understood about the HCTZ.  Wanda Lang,cma

## 2019-11-12 DIAGNOSIS — K269 Duodenal ulcer, unspecified as acute or chronic, without hemorrhage or perforation: Secondary | ICD-10-CM | POA: Insufficient documentation

## 2019-11-12 DIAGNOSIS — F439 Reaction to severe stress, unspecified: Secondary | ICD-10-CM | POA: Insufficient documentation

## 2019-11-12 NOTE — Assessment & Plan Note (Signed)
Discussed trying to get some activity even if it is just walking for exercise.  Discussed decreasing the quantity of trail mix and almonds.  Follow-up in 6 months for weight.

## 2019-11-12 NOTE — Progress Notes (Signed)
[name], MD Phone: [phone]  Wanda Lang is a 69 y.o. female who presents today for follow-up.  Hypertension: The patient has not been on any medications recently.  Notes her blood pressure is typically similar at home.  No chest pain or shortness of breath.  Obesity: Notes she has been stress eating.  Eats lots of trail mix and almonds.  Does eat a lot of vegetables.  No soda or sweet tea.  She has not been able to exercise much related to her left knee which she is seeing orthopedics for.  Stress: Patient notes quite a bit of stress with her brother-in-law living with them.  He has dementia and other psychiatric issues.  That causes lots of stress for her.  No depression.  Duodenal ulcer/gastritis: Seen on prior EGD.  No longer on a PPI.  No reflux.  Rare abdominal discomfort if any.  No blood in her stool.  Social History   Tobacco Use  Smoking Status Never Smoker  Smokeless Tobacco Never Used     ROS see history of present illness  Objective  Physical Exam Vitals:   11/06/19 1602  BP: 118/70  Pulse: 96  Temp: 97.6 F (36.4 C)  SpO2: 98%    BP Readings from Last 3 Encounters:  11/06/19 118/70  09/04/18 117/89  04/18/18 (!) 142/80   Wt Readings from Last 3 Encounters:  11/06/19 255 lb 6.4 oz (115.8 kg)  02/03/19 249 lb (112.9 kg)  09/04/18 249 lb (112.9 kg)    Physical Exam Constitutional:      General: She is not in acute distress.    Appearance: She is not diaphoretic.  Cardiovascular:     Rate and Rhythm: Normal rate and regular rhythm.     Heart sounds: Normal heart sounds.  Pulmonary:     Effort: Pulmonary effort is normal.     Breath sounds: Normal breath sounds.  Abdominal:     General: Bowel sounds are normal. There is no distension.     Palpations: Abdomen is soft.     Tenderness: There is no abdominal tenderness. There is no guarding or rebound.  Musculoskeletal:     Right lower leg: No edema.     Left lower leg: No edema.    Skin:    General: Skin is warm and dry.  Neurological:     Mental Status: She is alert.      Assessment/Plan: Please see individual problem list.  Problem List Items Addressed This Visit    Duodenal ulcer    History of this and gastritis.  She will trial over-the-counter Pepcid to see if that is beneficial for any symptoms she continues to have.  If not beneficial she will let us know.      Hypertension    Well-controlled on no medications.  We will check lab work.      Morbid obesity ([place])    Discussed trying to get some activity even if it is just walking for exercise.  Discussed decreasing the quantity of trail mix and almonds.  Follow-up in 6 months for weight.      Relevant Orders   Comp Met (CMET) (Completed)   Lipid panel (Completed)   HgB A1c (Completed)   Stress    Offered support.  She will monitor.       Other Visit Diagnoses    Elevated glucose    -  Primary   Relevant Orders   HgB A1c (Completed)  Health Maintenance: Patient is due for mammogram.  I will have the [facility] reach out to her to see if she is okay scheduling this.   This visit occurred during the SARS-CoV-2 public health emergency.  Safety protocols were in place, including screening questions prior to the visit, additional usage of staff PPE, and extensive cleaning of exam room while observing appropriate contact time as indicated for disinfecting solutions.    [name], MD [place]

## 2019-11-12 NOTE — Assessment & Plan Note (Signed)
Offered support.  She will monitor.

## 2019-11-12 NOTE — Assessment & Plan Note (Addendum)
History of this and gastritis.  She will trial over-the-counter Pepcid to see if that is beneficial for any symptoms she continues to have.  If not beneficial she will let us know.

## 2019-11-12 NOTE — Telephone Encounter (Signed)
Noted.  Her swelling could be related to venous insufficiency.  She should prop her legs up as much as she is able to and remain active.  If the swelling worsens or she develops shortness of breath, trouble breathing when laying down at night, or waking up short of breath she should let us know right away.

## 2019-11-12 NOTE — Assessment & Plan Note (Signed)
Well-controlled on no medications.  We will check lab work.

## 2019-11-12 NOTE — Telephone Encounter (Signed)
I called and spoke with the patient and left a message to call back.  Akilah Cureton,cma

## 2019-11-16 NOTE — Telephone Encounter (Signed)
I called and spoke with the patient  and informed her about the swelling in her legs and is she has any SOB while lying or at all to call us, I also went over her lab results and she does not want to be on medication at this time.  Tayler Lassen,cma

## 2019-11-17 NOTE — Progress Notes (Signed)
I called the patient and informed her that she needed a mammogram, patient stated she had it a few weeks ago with her GYN and she will call and have the records sent to [place].  Lannis Lichtenwalner,cma

## 2020-02-04 ENCOUNTER — Ambulatory Visit: Payer: Medicare Other

## 2020-04-22 DIAGNOSIS — M2040 Other hammer toe(s) (acquired), unspecified foot: Secondary | ICD-10-CM | POA: Insufficient documentation

## 2020-04-22 DIAGNOSIS — M19079 Primary osteoarthritis, unspecified ankle and foot: Secondary | ICD-10-CM | POA: Insufficient documentation

## 2020-04-22 DIAGNOSIS — M7742 Metatarsalgia, left foot: Secondary | ICD-10-CM | POA: Insufficient documentation

## 2020-05-06 ENCOUNTER — Ambulatory Visit: Payer: Medicare PPO | Admitting: Family Medicine

## 2020-05-06 ENCOUNTER — Ambulatory Visit: Payer: Medicare Other | Admitting: Family Medicine

## 2020-05-06 DIAGNOSIS — Z0289 Encounter for other administrative examinations: Secondary | ICD-10-CM

## 2020-05-30 ENCOUNTER — Ambulatory Visit: Payer: Medicare PPO | Admitting: Family Medicine

## 2020-05-31 ENCOUNTER — Other Ambulatory Visit: Payer: Self-pay

## 2020-06-06 ENCOUNTER — Ambulatory Visit: Payer: Medicare PPO | Admitting: Family Medicine

## 2020-07-07 ENCOUNTER — Ambulatory Visit
Admission: EM | Admit: 2020-07-07 | Discharge: 2020-07-07 | Disposition: A | Discharge status: Home / Self Care | Payer: Medicare PPO

## 2020-07-07 ENCOUNTER — Other Ambulatory Visit: Payer: Self-pay

## 2020-07-07 DIAGNOSIS — S20469A Insect bite (nonvenomous) of unspecified back wall of thorax, initial encounter: Secondary | ICD-10-CM | POA: Diagnosis not present

## 2020-07-07 DIAGNOSIS — M795 Residual foreign body in soft tissue: Secondary | ICD-10-CM

## 2020-07-07 DIAGNOSIS — W57XXXA Bitten or stung by nonvenomous insect and other nonvenomous arthropods, initial encounter: Secondary | ICD-10-CM

## 2020-07-07 NOTE — ED Triage Notes (Signed)
Pt sts she have a tick bite to mid back. sts she is not sure when she was bit but she had a itchy spot on her back 2 days ago.

## 2020-07-07 NOTE — ED Provider Notes (Signed)
Roderic Palau    CSN: [id number] Arrival date & time: 07/07/20  1320      History   Chief Complaint Chief Complaint  Patient presents with  . Tick Removal    HPI Wanda Lang is a 71 y.o. female.   Patient presents with a tick embedded on her left mid back.  She noticed the tick yesterday but states the spot had been itching for the past 2 days.  She denies fever, rash, headache, malaise, muscle aches, or other symptoms.  No treatments attempted at home.  Her medical history includes hypertension, CKD, arthritis, morbid obesity.  The history is provided by the patient and medical records.    Past Medical History:  Diagnosis Date  . Allergy   . Arthritis   . Chronic kidney disease   . GERD (gastroesophageal reflux disease)   . Hypertension   . LIVER FUNCTION TESTS, ABNORMAL, HX OF 09/08/2007   Qualifier: Diagnosis of  By: Nelson-Smith CMA (AAMA), Dottie      Patient Active Problem List   Diagnosis Date Noted  . Duodenal ulcer 11/12/2019  . Stress 11/12/2019  . Morbid obesity ([place]) 11/06/2019  . Heart murmur 02/06/2018  . Chronic diarrhea 10/09/2017  . Acid reflux 07/15/2017  . Asymptomatic carotid artery stenosis 07/15/2017  . Cyst of skin 07/15/2017  . Sinusitis 11/09/2016  . Immunocompromised ([place]) 11/09/2016  . Allergic rhinitis 11/02/2016  . Tremor 11/02/2016  . Hypertension 11/02/2016  . Postmenopausal symptoms 07/01/2015  . Rheumatoid arthritis involving both hands with positive rheumatoid factor ([place]) 07/01/2015  . Seasonal allergic rhinitis due to pollen 07/01/2015  . Fatty liver 09/08/2007  . Rheumatoid arthritis ([place]) 09/08/2007    Past Surgical History:  Procedure Laterality Date  . ABDOMINAL HYSTERECTOMY    . COLONOSCOPY WITH PROPOFOL N/A 09/04/2018   Procedure: COLONOSCOPY WITH PROPOFOL;  Surgeon: Virgel Manifold, MD;  Location: [facility] ENDOSCOPY;  Service: Endoscopy;  Laterality: N/A;  . ESOPHAGOGASTRODUODENOSCOPY (EGD) WITH PROPOFOL  N/A 09/04/2018   Procedure: ESOPHAGOGASTRODUODENOSCOPY (EGD) WITH PROPOFOL;  Surgeon: Virgel Manifold, MD;  Location: [facility] ENDOSCOPY;  Service: Endoscopy;  Laterality: N/A;  . LAPAROSCOPY    . TURBINATE REDUCTION      OB History   No obstetric history on file.      Home Medications    Prior to Admission medications   Medication Sig Start Date End Date Taking? Authorizing Provider  Abatacept (ORENCIA) 125 MG/ML SOSY Inject into the vein.    [provider]  acetaminophen (TYLENOL) 500 MG tablet Take 500 mg by mouth as needed.    [provider]  calcium carbonate 1250 MG capsule Take by mouth.    [provider]  celecoxib (CELEBREX) 200 MG capsule Take 200 mg by mouth daily.    [provider]  Coenzyme Q10 (COQ10) 200 MG CAPS Take 200 mg by mouth daily.    [provider]  Collagen 500 MG CAPS Take by mouth.    [provider]  cyclobenzaprine (FLEXERIL) 5 MG tablet Take 5 mg by mouth as needed.    [provider]  cycloSPORINE (RESTASIS) 0.05 % ophthalmic emulsion Apply to eye.    [provider]  Estradiol (VAGIFEM) 10 MCG TABS vaginal tablet Place vaginally.    [provider]  estradiol (VIVELLE-DOT) 0.05 MG/24HR patch Place 1 patch onto the skin 2 (two) times a week.    [provider]  Flaxseed, Linseed, (FLAXSEED OIL) 1000 MG CAPS Take by mouth.  [provider]  fluticasone (FLONASE) 50 MCG/ACT nasal spray Place 2 sprays into both nostrils daily. 09/20/17   Jodelle Green, FNP  Glucosamine-Chondroitin 250-200 MG CAPS Take by mouth.    [provider]  hydroxychloroquine (PLAQUENIL) 200 MG tablet TAKE 1 TABLET BY MOUTH DAILY FOR 2 WEEKS, THEN INCREASE TO 1 TABLET BY MOUTH TWICE DAILY    [provider]  Loratadine 10 MG CAPS Take 10 mg by mouth as needed.    [provider]  Magnesium 100 MG CAPS Take by mouth.    [provider]  milk  thistle 175 MG tablet Take 175 mg by mouth daily.    [provider]  pantoprazole (PROTONIX) 40 MG tablet Take 1 tablet (40 mg total) by mouth daily. 09/04/18 10/04/18  Virgel Manifold, MD  Potassium 99 MG TABS Take 99 mg by mouth 2 (two) times daily.    [provider]  predniSONE (DELTASONE) 10 MG tablet 20 mg. For sinus infection 10/18/17   [provider]  selenium 50 MCG TABS tablet Take 50 mcg by mouth daily.    [provider]  Vitamin D, Ergocalciferol, (DRISDOL) 50000 units CAPS capsule Take 50,000 Units by mouth every 14 (fourteen) days.    [provider]  ZINC MT Use as directed in the mouth or throat.    [provider]    Family History Family History  Problem Relation Age of Onset  . Alcohol abuse Mother   . Arthritis Mother   . Hyperlipidemia Mother   . Heart disease Mother   . Stroke Mother   . Hypertension Mother   . Depression Mother   . Anxiety disorder Mother   . Arthritis Father   . Lung cancer Paternal Grandfather   . Kidney disease Paternal Grandfather     Social History Social History   Tobacco Use  . Smoking status: Never Smoker  . Smokeless tobacco: Never Used  Vaping Use  . Vaping Use: Never used  Substance Use Topics  . Alcohol use: Yes    Alcohol/week: 1.0 standard drink    Types: 1 Glasses of wine per week  . Drug use: No     Allergies   Penicillins, Amoxicillin, Sulfonamide derivatives, and Ylang-ylang [cananga oil (ylang-ylang)]   Review of Systems Review of Systems  Constitutional: Negative for chills and fever.  Respiratory: Negative for cough and shortness of breath.   Cardiovascular: Negative for chest pain and palpitations.  Gastrointestinal: Negative for abdominal pain and vomiting.  Musculoskeletal: Negative for arthralgias and back pain.  Skin: Positive for wound. Negative for color change and rash.  All other systems reviewed and are negative.    Physical  Exam Triage Vital Signs ED Triage Vitals  Enc Vitals Group     BP 07/07/20 1328 128/79     Pulse Rate 07/07/20 1328 76     Resp 07/07/20 1328 18     Temp 07/07/20 1328 98 F (36.7 C)     Temp Source 07/07/20 1328 Oral     SpO2 07/07/20 1328 96 %     Weight 07/07/20 1329 250 lb (113.4 kg)     Height 07/07/20 1329 5' 4.5" (1.638 m)     Head Circumference --      Peak Flow --      Pain Score 07/07/20 1329 0     Pain Loc --      Pain Edu? --      Excl. in [place]? --  No data found.  Updated Vital Signs BP 128/79   Pulse 76   Temp 98 F (36.7 C) (Oral)   Resp 18   Ht 5' 4.5" (1.638 m)   Wt 250 lb (113.4 kg)   SpO2 96%   BMI 42.25 kg/m   Visual Acuity Right Eye Distance:   Left Eye Distance:   Bilateral Distance:    Right Eye Near:   Left Eye Near:    Bilateral Near:     Physical Exam Vitals and nursing note reviewed.  Constitutional:      General: She is not in acute distress.    Appearance: She is well-developed. She is obese. She is not ill-appearing.  HENT:     Head: Normocephalic and atraumatic.     Mouth/Throat:     Mouth: Mucous membranes are moist.  Eyes:     Conjunctiva/sclera: Conjunctivae normal.  Cardiovascular:     Rate and Rhythm: Normal rate and regular rhythm.     Heart sounds: Normal heart sounds.  Pulmonary:     Effort: Pulmonary effort is normal. No respiratory distress.     Breath sounds: Normal breath sounds.  Abdominal:     Palpations: Abdomen is soft.     Tenderness: There is no abdominal tenderness.  Musculoskeletal:     Cervical back: Neck supple.  Skin:    General: Skin is warm and dry.     Comments: Tick embedded on left mid back.  No erythema or drainage.  Neurological:     General: No focal deficit present.     Mental Status: She is alert and oriented to person, place, and time.  Psychiatric:        Mood and Affect: Mood normal.        Behavior: Behavior normal.      UC Treatments / Results  Labs (all labs ordered  are listed, but only abnormal results are displayed) Labs Reviewed - No data to display  EKG   Radiology No results found.  Procedures Foreign Body Removal  Date/Time: 07/07/2020 1:48 PM Performed by: Sharion Balloon, NP Authorized by: Sharion Balloon, NP   Consent:    Consent obtained:  Verbal   Consent given by:  Patient   Risks discussed:  Pain, bleeding and infection Universal protocol:    Procedure explained and questions answered to patient or proxy's satisfaction: yes   Location:    Location:  Trunk   Trunk location: left mid-back. Anesthesia:    Anesthesia method:  None Procedure type:    Procedure complexity:  Simple Procedure details:    Foreign bodies recovered:  1   Description:  Tick   Intact foreign body removal: yes   Post-procedure details:    Neurovascular status: intact     Confirmation:  No additional foreign bodies on visualization   Skin closure:  None   Dressing:  Open (no dressing)   Procedure completion:  Tolerated well, no immediate complications   (including critical care time)  Medications Ordered in UC Medications - No data to display  Initial Impression / Assessment and Plan / UC Course  I have reviewed the triage vital signs and the nursing notes.  Pertinent labs & imaging results that were available during my care of the patient were reviewed by me and considered in my medical decision making (see chart for details).   Tick bite with subsequent removal of tick.  Tick removed intact.  Education provided on tickborne illness symptoms.  Wound care instructions  discussed.  Instructed patient to return here or follow-up with her PCP right away if she develops fever, rash, or other symptoms.  She agrees to plan of care.   Final Clinical Impressions(s) / UC Diagnoses   Final diagnoses:  Tick bite with subsequent removal of tick     Discharge Instructions     See the attached information on tick bites.  Return here or follow-up with your  primary care provider if you have fever, rash, or other symptoms.          ED Prescriptions    None     PDMP not reviewed this encounter.   Sharion Balloon, NP 07/07/20 1351

## 2020-07-07 NOTE — Discharge Instructions (Signed)
See the attached information on tick bites.  Return here or follow-up with your primary care provider if you have fever, rash, or other symptoms.

## 2020-07-12 ENCOUNTER — Other Ambulatory Visit: Payer: Self-pay

## 2020-07-12 ENCOUNTER — Ambulatory Visit (INDEPENDENT_AMBULATORY_CARE_PROVIDER_SITE_OTHER): Payer: Medicare PPO

## 2020-07-12 VITALS — BP 127/83 | HR 87 | Temp 98.2°F | Resp 14 | Ht 64.5 in | Wt 254.8 lb

## 2020-07-12 DIAGNOSIS — Z0000 Encounter for general adult medical examination without abnormal findings: Secondary | ICD-10-CM | POA: Diagnosis not present

## 2020-07-12 NOTE — Progress Notes (Signed)
Subjective:   Wanda Lang is a 70 y.o. female who presents for Medicare Annual (Subsequent) preventive examination.  Review of Systems    No ROS.  Medicare Wellness   Cardiac Risk Factors include: advanced age (>66men, >35 women)     Objective:    Today's Vitals   07/12/20 1049  BP: 127/83  Pulse: 87  Resp: 14  Temp: 98.2 F (36.8 C)  SpO2: 96%  Weight: 254 lb 12.8 oz (115.6 kg)  Height: 5' 4.5" (1.638 m)   Body mass index is 43.06 kg/m.  Advanced Directives 07/12/2020 02/03/2019 09/04/2018  Does Patient Have a Medical Advance Directive? Yes Yes Yes  Type of Advance Directive Living will [place];Living will [place]  Does patient want to make changes to medical advance directive? No - Patient declined No - Patient declined -  Copy of [place] in Chart? - No - copy requested -    Current Medications (verified) Outpatient Encounter Medications as of 07/12/2020  Medication Sig  . Abatacept (ORENCIA) 125 MG/ML SOSY Inject into the vein.  Marland Kitchen acetaminophen (TYLENOL) 500 MG tablet Take 500 mg by mouth as needed.  . calcium carbonate 1250 MG capsule Take by mouth.  . celecoxib (CELEBREX) 200 MG capsule Take 200 mg by mouth daily.  . Coenzyme Q10 (COQ10) 200 MG CAPS Take 200 mg by mouth daily.  . Collagen 500 MG CAPS Take by mouth.  . cyclobenzaprine (FLEXERIL) 5 MG tablet Take 5 mg by mouth as needed.  . cycloSPORINE (RESTASIS) 0.05 % ophthalmic emulsion Apply to eye.  . Estradiol (VAGIFEM) 10 MCG TABS vaginal tablet Place vaginally.  Marland Kitchen estradiol (VIVELLE-DOT) 0.05 MG/24HR patch Place 1 patch onto the skin 2 (two) times a week.  . Flaxseed, Linseed, (FLAXSEED OIL) 1000 MG CAPS Take by mouth.  . fluticasone (FLONASE) 50 MCG/ACT nasal spray Place 2 sprays into both nostrils daily.  . Glucosamine-Chondroitin 250-200 MG CAPS Take by mouth.  . hydroxychloroquine (PLAQUENIL) 200 MG tablet TAKE 1 TABLET BY MOUTH DAILY FOR  2 WEEKS, THEN INCREASE TO 1 TABLET BY MOUTH TWICE DAILY  . Loratadine 10 MG CAPS Take 10 mg by mouth as needed.  . Magnesium 100 MG CAPS Take by mouth.  . milk thistle 175 MG tablet Take 175 mg by mouth daily.  . pantoprazole (PROTONIX) 40 MG tablet Take 1 tablet (40 mg total) by mouth daily.  . Potassium 99 MG TABS Take 99 mg by mouth 2 (two) times daily.  . predniSONE (DELTASONE) 10 MG tablet 20 mg. For sinus infection (Patient not taking: Reported on 07/12/2020)  . selenium 50 MCG TABS tablet Take 50 mcg by mouth daily.  . Vitamin D, Ergocalciferol, (DRISDOL) 50000 units CAPS capsule Take 50,000 Units by mouth every 14 (fourteen) days.  Marland Kitchen ZINC MT Use as directed in the mouth or throat.   No facility-administered encounter medications on file as of 07/12/2020.    Allergies (verified) Penicillins, Amoxicillin, Sulfonamide derivatives, and Ylang-ylang [cananga oil (ylang-ylang)]   History: Past Medical History:  Diagnosis Date  . Allergy   . Arthritis   . Chronic kidney disease   . GERD (gastroesophageal reflux disease)   . Hypertension   . LIVER FUNCTION TESTS, ABNORMAL, HX OF 09/08/2007   Qualifier: Diagnosis of  By: Harlon Ditty CMA (AAMA), Dottie     Past Surgical History:  Procedure Laterality Date  . ABDOMINAL HYSTERECTOMY    . COLONOSCOPY WITH PROPOFOL N/A 09/04/2018  Procedure: COLONOSCOPY WITH PROPOFOL;  Surgeon: Virgel Manifold, MD;  Location: [facility] ENDOSCOPY;  Service: Endoscopy;  Laterality: N/A;  . ESOPHAGOGASTRODUODENOSCOPY (EGD) WITH PROPOFOL N/A 09/04/2018   Procedure: ESOPHAGOGASTRODUODENOSCOPY (EGD) WITH PROPOFOL;  Surgeon: Virgel Manifold, MD;  Location: [facility] ENDOSCOPY;  Service: Endoscopy;  Laterality: N/A;  . LAPAROSCOPY    . TURBINATE REDUCTION     Family History  Problem Relation Age of Onset  . Alcohol abuse Mother   . Arthritis Mother   . Hyperlipidemia Mother   . Heart disease Mother   . Stroke Mother   . Hypertension Mother   . Depression  Mother   . Anxiety disorder Mother   . Arthritis Father   . Lung cancer Paternal Grandfather   . Kidney disease Paternal Grandfather    Social History   Socioeconomic History  . Marital status: Married    Spouse name: Not on file  . Number of children: Not on file  . Years of education: Not on file  . Highest education level: Not on file  Occupational History  . Not on file  Tobacco Use  . Smoking status: Never Smoker  . Smokeless tobacco: Never Used  Vaping Use  . Vaping Use: Never used  Substance and Sexual Activity  . Alcohol use: Yes    Alcohol/week: 1.0 standard drink    Types: 1 Glasses of wine per week  . Drug use: No  . Sexual activity: Never  Other Topics Concern  . Not on file  Social History Narrative  . Not on file   Social Determinants of Health   Financial Resource Strain: Low Risk   . Difficulty of Paying Living Expenses: Not hard at all  Food Insecurity: No Food Insecurity  . Worried About Charity fundraiser in the Last Year: Never true  . Ran Out of Food in the Last Year: Never true  Transportation Needs: No Transportation Needs  . Lack of Transportation (Medical): No  . Lack of Transportation (Non-Medical): No  Physical Activity: Not on file  Stress: No Stress Concern Present  . Feeling of Stress : Not at all  Social Connections: Unknown  . Frequency of Communication with Friends and Family: More than three times a week  . Frequency of Social Gatherings with Friends and Family: More than three times a week  . Attends Religious Services: More than 4 times per year  . Active Member of Clubs or Organizations: Not on file  . Attends Archivist Meetings: Not on file  . Marital Status: Widowed    Tobacco Counseling Counseling given: Not Answered   Clinical Intake:  Pre-visit preparation completed: Yes        Diabetes: No  How often do you need to have someone help you when you read instructions, pamphlets, or other written  materials from your doctor or pharmacy?: 1 - Never   Interpreter Needed?: No      Activities of Daily Living In your present state of health, do you have any difficulty performing the following activities: 07/12/2020  Hearing? N  Vision? N  Difficulty concentrating or making decisions? N  Walking or climbing stairs? N  Dressing or bathing? N  Doing errands, shopping? N  Preparing Food and eating ? N  Using the Toilet? N  In the past six months, have you accidently leaked urine? N  Do you have problems with loss of bowel control? N  Managing your Medications? N  Managing your Finances? N  Housekeeping or  managing your Housekeeping? N  Some recent data might be hidden    Patient Care Team: Leone Haven, MD as PCP - General (Family Medicine)  Indicate any recent Medical Services you may have received from other than [facility] providers in the past year (date may be approximate).     Assessment:   This is a routine wellness examination for Wanda Lang.  Hearing/Vision screen  Hearing Screening   125Hz  250Hz  500Hz  1000Hz  2000Hz  3000Hz  4000Hz  6000Hz  8000Hz   Right ear:           Left ear:           Comments: Patient is able to hear conversational tones without difficulty.  No issues reported.  Vision Screening Comments: Followed by Alphonzo Cruise  Wears corrective lenses They have seen their ophthalmologist in the last 12 months.     Dietary issues and exercise activities discussed: Current Exercise Habits: Home exercise routine, Intensity: Mild  Healthy diet Avoids dairy and soy Good water intake  Goals Addressed            This Visit's Progress   . Increase physical activity       As tolerated      Depression Screen PHQ 2/9 Scores 07/12/2020 11/06/2019 02/03/2019 02/03/2018 11/02/2016  PHQ - 2 Score 0 0 0 0 0    Fall Risk Fall Risk  07/12/2020 11/06/2019 02/03/2019 02/03/2018 11/02/2016  Falls in the past year? 0 0 0 0 No  Number falls in past yr: 0 0 - 0 -   Injury with Fall? 0 - - 0 -  Follow up Falls evaluation completed Falls evaluation completed Falls prevention discussed - -    FALL RISK PREVENTION PERTAINING TO THE HOME: Handrails in use when climbing stairs? Yes Home free of loose throw rugs in walkways, pet beds, electrical cords, etc? Yes  Adequate lighting in your home to reduce risk of falls? Yes   ASSISTIVE DEVICES UTILIZED TO PREVENT FALLS: Life alert? No  Use of a cane, walker or w/c? No   TIMED UP AND GO: Was the test performed? Yes .  Length of time to ambulate 10 feet: 15 sec.   Gait slow and steady without use of assistive device  Cognitive Function:  Patient is alert and oriented x3.    6CIT Screen 02/03/2019  What Year? 0 points  What month? 0 points  What time? 0 points  Count back from 20 0 points  Months in reverse 0 points  Repeat phrase 0 points  Total Score 0    Immunizations Immunization History  Administered Date(s) Administered  . Hep A / Hep B 08/22/2017, 09/24/2017, 02/26/2018  . Influenza Inj Mdck Quad Pf 01/02/2016  . Influenza, Quadrivalent, Recombinant, Inj, Pf 01/16/2018  . Influenza,inj,quad, With Preservative 11/27/2018  . Influenza-Unspecified 11/30/2011, 02/19/2013, 01/21/2014  . PFIZER(Purple Top)SARS-COV-2 Vaccination 04/02/2019, 04/28/2019, 11/19/2019    TDAP status: Due, Education has been provided regarding the importance of this vaccine. Advised may receive this vaccine at local pharmacy or Health Dept. Aware to provide a copy of the vaccination record if obtained from local pharmacy or Health Dept. Verbalized acceptance and understanding. Deferred.   Health Maintenance Health Maintenance  Topic Date Due  . MAMMOGRAM  06/07/2018  . Zoster Vaccines- Shingrix (1 of 2) 10/12/2020 (Originally 11/22/2000)  . Pneumococcal Vaccine 16-63 Years old (1 of 4 - PCV13) 11/05/2020 (Originally 11/22/1956)  . TETANUS/TDAP  07/12/2021 (Originally 11/22/1969)  . PNA vac Low Risk Adult (1 of  2 -  PCV13) 07/12/2021 (Originally 11/23/2015)  . INFLUENZA VACCINE  09/05/2020  . COLONOSCOPY (Pts 45-2yrs Insurance coverage will need to be confirmed)  09/03/2028  . DEXA SCAN  Completed  . COVID-19 Vaccine  Completed  . Hepatitis C Screening  Completed  . HPV VACCINES  Aged Out   Colorectal cancer screening: Type of screening: Colonoscopy. Completed 09/04/18. Repeat every 10 years  Mammogram- patient plans to update record.   PNA, Zoster vaccine- patient plans to update records.   Lung Cancer Screening: (Low Dose CT Chest recommended if Age 6-80 years, 30 pack-year currently smoking OR have quit w/in 15years.) does not qualify.   Vision Screening: Recommended annual ophthalmology exams for early detection of glaucoma and other disorders of the eye. Is the patient up to date with their annual eye exam?  Yes   Dental Screening: Recommended annual dental exams for proper oral hygiene.  Community Resource Referral / Chronic Care Management: CRR required this visit?  No   CCM required this visit?  No      Plan:   Keep all routine maintenance appointments.   I have personally reviewed and noted the following in the patient's chart:   . Medical and social history . Use of alcohol, tobacco or illicit drugs  . Current medications and supplements including opioid prescriptions. Patient is not currently taking opioids.  . Functional ability and status . Nutritional status . Physical activity . Advanced directives . List of other physicians . Hospitalizations, surgeries, and ER visits in previous 12 months . Vitals . Screenings to include cognitive, depression, and falls . Referrals and appointments  In addition, I have reviewed and discussed with patient certain preventive protocols, quality metrics, and best practice recommendations. A written personalized care plan for preventive services as well as general preventive health recommendations were provided to patient.      Varney Biles, LPN   [id number]/[id number]

## 2020-07-12 NOTE — Patient Instructions (Addendum)
Wanda Lang , Thank you for taking time to come for your Medicare Wellness Visit. I appreciate your ongoing commitment to your health goals. Please review the following plan we discussed and let me know if I can assist you in the future.   These are the goals we discussed: Goals    . Increase physical activity     As tolerated       This is a list of the screening recommended for you and due dates:  Health Maintenance  Topic Date Due  . Mammogram  06/07/2018  . Zoster (Shingles) Vaccine (1 of 2) 10/12/2020*  . Pneumococcal Vaccination (1 of 4 - PCV13) 11/05/2020*  . Tetanus Vaccine  07/12/2021*  . Pneumonia vaccines (1 of 2 - PCV13) 07/12/2021*  . Flu Shot  09/05/2020  . Colon Cancer Screening  09/03/2028  . DEXA scan (bone density measurement)  Completed  . COVID-19 Vaccine  Completed  . Hepatitis C Screening: USPSTF Recommendation to screen - Ages 26-79 yo.  Completed  . HPV Vaccine  Aged Out  *Topic was postponed. The date shown is not the original due date.    Immunizations Immunization History  Administered Date(s) Administered  . Hep A / Hep B 08/22/2017, 09/24/2017, 02/26/2018  . Influenza Inj Mdck Quad Pf 01/02/2016  . Influenza,inj,quad, With Preservative 11/27/2018  . PFIZER(Purple Top)SARS-COV-2 Vaccination 04/02/2019, 04/28/2019   Advanced directives: End of life planning; Advance aging; Advanced directives discussed.  Copy of current HCPOA/Living Will requested.    Conditions/risks identified: none new  Follow up in one year for your annual wellness visit   Update immunization record Update mammogram record   Preventive Care 2 Years and Older, Female Preventive care refers to lifestyle choices and visits with your health care provider that can promote health and wellness. What does preventive care include?  A yearly physical exam. This is also called an annual well check.  Dental exams once or twice a year.  Routine eye exams. Ask your health care  provider how often you should have your eyes checked.  Personal lifestyle choices, including:  Daily care of your teeth and gums.  Regular physical activity.  Eating a healthy diet.  Avoiding tobacco and drug use.  Limiting alcohol use.  Practicing safe sex.  Taking low-dose aspirin every day.  Taking vitamin and mineral supplements as recommended by your health care provider. What happens during an annual well check? The services and screenings done by your health care provider during your annual well check will depend on your age, overall health, lifestyle risk factors, and family history of disease. Counseling  Your health care provider may ask you questions about your:  Alcohol use.  Tobacco use.  Drug use.  Emotional well-being.  Home and relationship well-being.  Sexual activity.  Eating habits.  History of falls.  Memory and ability to understand (cognition).  Work and work Statistician.  Reproductive health. Screening  You may have the following tests or measurements:  Height, weight, and BMI.  Blood pressure.  Lipid and cholesterol levels. These may be checked every 5 years, or more frequently if you are over 64 years old.  Skin check.  Lung cancer screening. You may have this screening every year starting at age 25 if you have a 30-pack-year history of smoking and currently smoke or have quit within the past 15 years.  Fecal occult blood test (FOBT) of the stool. You may have this test every year starting at age 66.  Flexible sigmoidoscopy or  colonoscopy. You may have a sigmoidoscopy every 5 years or a colonoscopy every 10 years starting at age 79.  Hepatitis C blood test.  Hepatitis B blood test.  Sexually transmitted disease (STD) testing.  Diabetes screening. This is done by checking your blood sugar (glucose) after you have not eaten for a while (fasting). You may have this done every 1-3 years.  Bone density scan. This is done to  screen for osteoporosis. You may have this done starting at age 71.  Mammogram. This may be done every 1-2 years. Talk to your health care provider about how often you should have regular mammograms. Talk with your health care provider about your test results, treatment options, and if necessary, the need for more tests. Vaccines  Your health care provider may recommend certain vaccines, such as:  Influenza vaccine. This is recommended every year.  Tetanus, diphtheria, and acellular pertussis (Tdap, Td) vaccine. You may need a Td booster every 10 years.  Zoster vaccine. You may need this after age 62.  Pneumococcal 13-valent conjugate (PCV13) vaccine. One dose is recommended after age 68.  Pneumococcal polysaccharide (PPSV23) vaccine. One dose is recommended after age 59. Talk to your health care provider about which screenings and vaccines you need and how often you need them. This information is not intended to replace advice given to you by your health care provider. Make sure you discuss any questions you have with your health care provider. Document Released: 02/18/2015 Document Revised: 10/12/2015 Document Reviewed: 11/23/2014 Elsevier Interactive Patient Education  2017 [place] Prevention in the Home Falls can cause injuries. They can happen to people of all ages. There are many things you can do to make your home safe and to help prevent falls. What can I do on the outside of my home?  Regularly fix the edges of walkways and driveways and fix any cracks.  Remove anything that might make you trip as you walk through a door, such as a raised step or threshold.  Trim any bushes or trees on the path to your home.  Use bright outdoor lighting.  Clear any walking paths of anything that might make someone trip, such as rocks or tools.  Regularly check to see if handrails are loose or broken. Make sure that both sides of any steps have handrails.  Any raised decks  and porches should have guardrails on the edges.  Have any leaves, snow, or ice cleared regularly.  Use sand or salt on walking paths during winter.  Clean up any spills in your garage right away. This includes oil or grease spills. What can I do in the bathroom?  Use night lights.  Install grab bars by the toilet and in the tub and shower. Do not use towel bars as grab bars.  Use non-skid mats or decals in the tub or shower.  If you need to sit down in the shower, use a plastic, non-slip stool.  Keep the floor dry. Clean up any water that spills on the floor as soon as it happens.  Remove soap buildup in the tub or shower regularly.  Attach bath mats securely with double-sided non-slip rug tape.  Do not have throw rugs and other things on the floor that can make you trip. What can I do in the bedroom?  Use night lights.  Make sure that you have a light by your bed that is easy to reach.  Do not use any sheets or blankets that are too  big for your bed. They should not hang down onto the floor.  Have a firm chair that has side arms. You can use this for support while you get dressed.  Do not have throw rugs and other things on the floor that can make you trip. What can I do in the kitchen?  Clean up any spills right away.  Avoid walking on wet floors.  Keep items that you use a lot in easy-to-reach places.  If you need to reach something above you, use a strong step stool that has a grab bar.  Keep electrical cords out of the way.  Do not use floor polish or wax that makes floors slippery. If you must use wax, use non-skid floor wax.  Do not have throw rugs and other things on the floor that can make you trip. What can I do with my stairs?  Do not leave any items on the stairs.  Make sure that there are handrails on both sides of the stairs and use them. Fix handrails that are broken or loose. Make sure that handrails are as long as the stairways.  Check any  carpeting to make sure that it is firmly attached to the stairs. Fix any carpet that is loose or worn.  Avoid having throw rugs at the top or bottom of the stairs. If you do have throw rugs, attach them to the floor with carpet tape.  Make sure that you have a light switch at the top of the stairs and the bottom of the stairs. If you do not have them, ask someone to add them for you. What else can I do to help prevent falls?  Wear shoes that:  Do not have high heels.  Have rubber bottoms.  Are comfortable and fit you well.  Are closed at the toe. Do not wear sandals.  If you use a stepladder:  Make sure that it is fully opened. Do not climb a closed stepladder.  Make sure that both sides of the stepladder are locked into place.  Ask someone to hold it for you, if possible.  Clearly mark and make sure that you can see:  Any grab bars or handrails.  First and last steps.  Where the edge of each step is.  Use tools that help you move around (mobility aids) if they are needed. These include:  Canes.  Walkers.  Scooters.  Crutches.  Turn on the lights when you go into a dark area. Replace any light bulbs as soon as they burn out.  Set up your furniture so you have a clear path. Avoid moving your furniture around.  If any of your floors are uneven, fix them.  If there are any pets around you, be aware of where they are.  Review your medicines with your doctor. Some medicines can make you feel dizzy. This can increase your chance of falling. Ask your doctor what other things that you can do to help prevent falls. This information is not intended to replace advice given to you by your health care provider. Make sure you discuss any questions you have with your health care provider. Document Released: 11/18/2008 Document Revised: 06/30/2015 Document Reviewed: 02/26/2014 Elsevier Interactive Patient Education  2017 [employer].

## 2020-09-12 DIAGNOSIS — M1712 Unilateral primary osteoarthritis, left knee: Secondary | ICD-10-CM | POA: Diagnosis not present

## 2020-09-12 DIAGNOSIS — M25561 Pain in right knee: Secondary | ICD-10-CM | POA: Diagnosis not present

## 2020-09-12 DIAGNOSIS — M170 Bilateral primary osteoarthritis of knee: Secondary | ICD-10-CM | POA: Diagnosis not present

## 2020-09-16 ENCOUNTER — Telehealth: Payer: Self-pay | Admitting: Family Medicine

## 2020-09-16 NOTE — Telephone Encounter (Signed)
Patient has appointment coming up and will wait to address this then.

## 2020-09-16 NOTE — Telephone Encounter (Signed)
Noted. Will discuss at upcoming visit

## 2020-09-16 NOTE — Telephone Encounter (Signed)
Patient informed, Due to the high volume of calls and your symptoms we have to forward your call to our Triage Nurse to expedient your call. Please hold for the transfer.  Patient transferred to Access Nurse. Due to having a vein on her lip that she is unsure what to do for this issue.No openings in office or virtual.

## 2020-09-21 DIAGNOSIS — M0589 Other rheumatoid arthritis with rheumatoid factor of multiple sites: Secondary | ICD-10-CM | POA: Diagnosis not present

## 2020-09-23 ENCOUNTER — Ambulatory Visit: Payer: Medicare PPO | Admitting: Family Medicine

## 2020-10-20 DIAGNOSIS — M0589 Other rheumatoid arthritis with rheumatoid factor of multiple sites: Secondary | ICD-10-CM | POA: Diagnosis not present

## 2020-11-17 DIAGNOSIS — M0589 Other rheumatoid arthritis with rheumatoid factor of multiple sites: Secondary | ICD-10-CM | POA: Diagnosis not present

## 2020-11-24 DIAGNOSIS — Z6841 Body Mass Index (BMI) 40.0 and over, adult: Secondary | ICD-10-CM | POA: Diagnosis not present

## 2020-11-24 DIAGNOSIS — Z124 Encounter for screening for malignant neoplasm of cervix: Secondary | ICD-10-CM | POA: Diagnosis not present

## 2020-11-24 DIAGNOSIS — Z90710 Acquired absence of both cervix and uterus: Secondary | ICD-10-CM | POA: Diagnosis not present

## 2020-11-24 DIAGNOSIS — Z1231 Encounter for screening mammogram for malignant neoplasm of breast: Secondary | ICD-10-CM | POA: Diagnosis not present

## 2020-11-24 DIAGNOSIS — M5451 Vertebrogenic low back pain: Secondary | ICD-10-CM | POA: Diagnosis not present

## 2020-11-24 DIAGNOSIS — R011 Cardiac murmur, unspecified: Secondary | ICD-10-CM | POA: Insufficient documentation

## 2020-11-24 DIAGNOSIS — M5459 Other low back pain: Secondary | ICD-10-CM | POA: Diagnosis not present

## 2020-11-24 DIAGNOSIS — Z1272 Encounter for screening for malignant neoplasm of vagina: Secondary | ICD-10-CM | POA: Diagnosis not present

## 2020-11-30 ENCOUNTER — Other Ambulatory Visit: Payer: Self-pay

## 2020-11-30 ENCOUNTER — Ambulatory Visit: Payer: Medicare PPO | Admitting: Family Medicine

## 2020-11-30 ENCOUNTER — Encounter: Payer: Self-pay | Admitting: Family Medicine

## 2020-11-30 DIAGNOSIS — Z23 Encounter for immunization: Secondary | ICD-10-CM

## 2020-11-30 DIAGNOSIS — Z8679 Personal history of other diseases of the circulatory system: Secondary | ICD-10-CM | POA: Diagnosis not present

## 2020-11-30 DIAGNOSIS — M069 Rheumatoid arthritis, unspecified: Secondary | ICD-10-CM | POA: Diagnosis not present

## 2020-11-30 DIAGNOSIS — R251 Tremor, unspecified: Secondary | ICD-10-CM

## 2020-11-30 DIAGNOSIS — R011 Cardiac murmur, unspecified: Secondary | ICD-10-CM

## 2020-11-30 DIAGNOSIS — D849 Immunodeficiency, unspecified: Secondary | ICD-10-CM

## 2020-11-30 LAB — COMPREHENSIVE METABOLIC PANEL
ALT: 9 U/L (ref 0–35)
AST: 16 U/L (ref 0–37)
Albumin: 4.2 g/dL (ref 3.5–5.2)
Alkaline Phosphatase: 70 U/L (ref 39–117)
BUN: 14 mg/dL (ref 6–23)
CO2: 32 mEq/L (ref 19–32)
Calcium: 9.3 mg/dL (ref 8.4–10.5)
Chloride: 106 mEq/L (ref 96–112)
Creatinine, Ser: 0.79 mg/dL (ref 0.40–1.20)
GFR: 76 mL/min (ref >60.00)
Glucose, Bld: 86 mg/dL (ref 70–99)
Potassium: 4.6 mEq/L (ref 3.5–5.1)
Sodium: 142 mEq/L (ref 135–145)
Total Bilirubin: 0.7 mg/dL (ref 0.2–1.2)
Total Protein: 6.4 g/dL (ref 6.0–8.3)

## 2020-11-30 LAB — HEMOGLOBIN A1C: Hgb A1c MFr Bld: 5.6 % (ref 4.6–6.5)

## 2020-11-30 LAB — LIPID PANEL
Cholesterol: 177 mg/dL (ref 0–200)
HDL: 55.3 mg/dL (ref >39.00)
LDL Cholesterol: 111 mg/dL — ABNORMAL HIGH (ref 0–99)
NonHDL: 121.8
Total CHOL/HDL Ratio: 3
Triglycerides: 54 mg/dL (ref 0.0–149.0)
VLDL: 10.8 mg/dL (ref 0.0–40.0)

## 2020-11-30 NOTE — Assessment & Plan Note (Signed)
Blood pressure has been stable.  We will continue to monitor.

## 2020-11-30 NOTE — Progress Notes (Signed)
Wanda Rumps, MD Phone: [phone]  Wanda Lang is a 70 y.o. female who presents today for follow-up.  History of hypertension: Blood pressure seems to be stable.  She is not checking at home.  No chest pain or shortness of breath.  Her lower extremity edema has been improving.  Obesity: Patient has been eating lots of vegetables.  She has been eating less meat and more fish.  She has been walking a little bit though notes her chronic knee issues limit her exercise.  She has found that getting to bed earlier has been helpful for her weight.  In the past she has had difficulty with a stationary bike given her knee issues.  Essential tremor: Patient notes this only bothers her if she is holding something with her hand in a certain position.  She notes no resting tremor.  Rheumatoid arthritis: She is currently taking Celebrex, Orencia, and Plaquenil.  She notes she was not advised to see ophthalmology periodically given that she is on Plaquenil.  She feels as though things are well controlled.  Heart murmur: Patient notes she continues to follow with cardiology.  She notes they are likely going to do an echo in the near future.  She notes no lightheadedness.  Her husband passed away after battling lymphoma and liver issues.  She notes she has generally been doing fairly well with this.  She did go to grief counseling for a while and thinks that was beneficial.  Social History   Tobacco Use  Smoking Status Never  Smokeless Tobacco Never    Current Outpatient Medications on File Prior to Visit  Medication Sig Dispense Refill   Abatacept 125 MG/ML SOSY Inject into the vein.     acetaminophen (TYLENOL) 500 MG tablet Take 500 mg by mouth as needed.     calcium carbonate 1250 MG capsule Take by mouth.     celecoxib (CELEBREX) 200 MG capsule Take 200 mg by mouth daily.     Coenzyme Q10 (COQ10) 200 MG CAPS Take 200 mg by mouth daily.     Collagen 500 MG CAPS Take by mouth.      cyclobenzaprine (FLEXERIL) 5 MG tablet Take 5 mg by mouth as needed.     cycloSPORINE (RESTASIS) 0.05 % ophthalmic emulsion Apply to eye.     estradiol (VIVELLE-DOT) 0.05 MG/24HR patch Place 1 patch onto the skin 2 (two) times a week.     Estradiol 10 MCG TABS vaginal tablet Place vaginally.     Flaxseed, Linseed, (FLAXSEED OIL) 1000 MG CAPS Take by mouth.     fluticasone (FLONASE) 50 MCG/ACT nasal spray Place 2 sprays into both nostrils daily. 16 g 6   Glucosamine-Chondroitin 250-200 MG CAPS Take by mouth.     hydroxychloroquine (PLAQUENIL) 200 MG tablet TAKE 1 TABLET BY MOUTH DAILY FOR 2 WEEKS, THEN INCREASE TO 1 TABLET BY MOUTH TWICE DAILY     Loratadine 10 MG CAPS Take 10 mg by mouth as needed.     Magnesium 100 MG CAPS Take by mouth.     milk thistle 175 MG tablet Take 175 mg by mouth daily.     Potassium 99 MG TABS Take 99 mg by mouth 2 (two) times daily.     predniSONE (DELTASONE) 10 MG tablet 20 mg. For sinus infection     selenium 50 MCG TABS tablet Take 50 mcg by mouth daily.     Vitamin D, Ergocalciferol, (DRISDOL) 50000 units CAPS capsule Take 50,000 Units by mouth every  14 (fourteen) days.     ZINC MT Use as directed in the mouth or throat.     pantoprazole (PROTONIX) 40 MG tablet Take 1 tablet (40 mg total) by mouth daily. 30 tablet 1   No current facility-administered medications on file prior to visit.     ROS see history of present illness  Objective  Physical Exam Vitals:   11/30/20 0945  BP: 118/78  Pulse: 86  Temp: 98.5 F (36.9 C)  SpO2: 98%    BP Readings from Last 3 Encounters:  11/30/20 118/78  07/12/20 127/83  07/07/20 128/79   Wt Readings from Last 3 Encounters:  11/30/20 253 lb 3.2 oz (114.9 kg)  07/12/20 254 lb 12.8 oz (115.6 kg)  07/07/20 250 lb (113.4 kg)    Physical Exam Constitutional:      General: She is not in acute distress.    Appearance: She is not diaphoretic.  Cardiovascular:     Rate and Rhythm: Normal rate and regular  rhythm.     Heart sounds: Murmur (2/6 systolic murmur right upper sternal border) heard.  Pulmonary:     Effort: Pulmonary effort is normal.     Breath sounds: Normal breath sounds.  Skin:    General: Skin is warm and dry.  Neurological:     Mental Status: She is alert.     Assessment/Plan: Please see individual problem list.  Problem List Items Addressed This Visit     Heart murmur    She will continue to follow with cardiology for this.      History of hypertension    Blood pressure has been stable.  We will continue to monitor.      Immunocompromised ([place])    She is on an immune modulator for her RA.  Vaccines brought up-to-date today.      Morbid obesity ([place]) - Primary    Encouraged trying to build in some exercise to her daily habit.  Continue with healthy diet.      Relevant Orders   Comp Met (CMET)   Lipid panel   HgB A1c   Rheumatoid arthritis ([place])    Reports this is generally stable.  She will continue medication management through rheumatology.  I did discuss that she needs to periodically see ophthalmology given that she is on Plaquenil.  She sees them in the near future and I encouraged her to advise them that she is now on Plaquenil.      Tremor    Stable.  We will monitor.      Other Visit Diagnoses     Need for immunization against influenza       Relevant Orders   Flu Vaccine QUAD High Dose(Fluad) (Completed)      Offered support following the passing of her husband.  Advised if she needed anything in the future regarding grief she should let us know.  Health Maintenance: prevnar 20 and flu vaccine given today.   Return in about 1 year (around 11/30/2021) for yearly follow-up.  This visit occurred during the SARS-CoV-2 public health emergency.  Safety protocols were in place, including screening questions prior to the visit, additional usage of staff PPE, and extensive cleaning of exam room while observing appropriate contact time as  indicated for disinfecting solutions.    Wanda Rumps, MD [place]

## 2020-11-30 NOTE — Assessment & Plan Note (Signed)
She is on an immune modulator for her RA.  Vaccines brought up-to-date today.

## 2020-11-30 NOTE — Patient Instructions (Signed)
Nice to see you. We will get labs today. If you need anything moving forward regarding grief please let me know.

## 2020-11-30 NOTE — Assessment & Plan Note (Signed)
She will continue to follow with cardiology for this.

## 2020-11-30 NOTE — Assessment & Plan Note (Signed)
Reports this is generally stable.  She will continue medication management through rheumatology.  I did discuss that she needs to periodically see ophthalmology given that she is on Plaquenil.  She sees them in the near future and I encouraged her to advise them that she is now on Plaquenil.

## 2020-11-30 NOTE — Assessment & Plan Note (Signed)
Encouraged trying to build in some exercise to her daily habit.  Continue with healthy diet.

## 2020-11-30 NOTE — Assessment & Plan Note (Signed)
Stable.  We will monitor.

## 2020-12-01 NOTE — Addendum Note (Signed)
Addended by: Fulton Mole D on: 12/01/2020 05:04 PM   Modules accepted: Orders

## 2020-12-09 ENCOUNTER — Telehealth: Payer: Self-pay

## 2020-12-09 NOTE — Telephone Encounter (Signed)
I called the patient and informed her that the medical release form she signed for our office will not be accepted by [facility] in [place], they have their own form and I asked if she would come into our office and  sign the form it would be placed at the front desk and given back to me to fax to their office and she understood and agreed/ Wanda Lang,cma

## 2020-12-14 DIAGNOSIS — M25561 Pain in right knee: Secondary | ICD-10-CM | POA: Diagnosis not present

## 2020-12-14 DIAGNOSIS — M25562 Pain in left knee: Secondary | ICD-10-CM | POA: Diagnosis not present

## 2020-12-14 DIAGNOSIS — M1711 Unilateral primary osteoarthritis, right knee: Secondary | ICD-10-CM | POA: Diagnosis not present

## 2020-12-14 DIAGNOSIS — Z6841 Body Mass Index (BMI) 40.0 and over, adult: Secondary | ICD-10-CM | POA: Diagnosis not present

## 2020-12-14 DIAGNOSIS — M170 Bilateral primary osteoarthritis of knee: Secondary | ICD-10-CM | POA: Diagnosis not present

## 2020-12-14 DIAGNOSIS — M1712 Unilateral primary osteoarthritis, left knee: Secondary | ICD-10-CM | POA: Diagnosis not present

## 2020-12-15 DIAGNOSIS — M0589 Other rheumatoid arthritis with rheumatoid factor of multiple sites: Secondary | ICD-10-CM | POA: Diagnosis not present

## 2020-12-22 DIAGNOSIS — M5451 Vertebrogenic low back pain: Secondary | ICD-10-CM | POA: Diagnosis not present

## 2021-01-02 DIAGNOSIS — R0609 Other forms of dyspnea: Secondary | ICD-10-CM | POA: Diagnosis not present

## 2021-01-02 DIAGNOSIS — I10 Essential (primary) hypertension: Secondary | ICD-10-CM | POA: Diagnosis not present

## 2021-01-02 DIAGNOSIS — I3481 Nonrheumatic mitral (valve) annulus calcification: Secondary | ICD-10-CM | POA: Diagnosis not present

## 2021-01-02 DIAGNOSIS — I350 Nonrheumatic aortic (valve) stenosis: Secondary | ICD-10-CM | POA: Diagnosis not present

## 2021-01-03 DIAGNOSIS — M549 Dorsalgia, unspecified: Secondary | ICD-10-CM | POA: Diagnosis not present

## 2021-01-03 DIAGNOSIS — M1710 Unilateral primary osteoarthritis, unspecified knee: Secondary | ICD-10-CM | POA: Diagnosis not present

## 2021-01-03 DIAGNOSIS — M150 Primary generalized (osteo)arthritis: Secondary | ICD-10-CM | POA: Diagnosis not present

## 2021-01-03 DIAGNOSIS — Z79899 Other long term (current) drug therapy: Secondary | ICD-10-CM | POA: Diagnosis not present

## 2021-01-03 DIAGNOSIS — M79671 Pain in right foot: Secondary | ICD-10-CM | POA: Diagnosis not present

## 2021-01-03 DIAGNOSIS — M5451 Vertebrogenic low back pain: Secondary | ICD-10-CM | POA: Diagnosis not present

## 2021-01-03 DIAGNOSIS — M2140 Flat foot [pes planus] (acquired), unspecified foot: Secondary | ICD-10-CM | POA: Diagnosis not present

## 2021-01-03 DIAGNOSIS — M0589 Other rheumatoid arthritis with rheumatoid factor of multiple sites: Secondary | ICD-10-CM | POA: Diagnosis not present

## 2021-01-03 DIAGNOSIS — J302 Other seasonal allergic rhinitis: Secondary | ICD-10-CM | POA: Diagnosis not present

## 2021-01-03 DIAGNOSIS — M25561 Pain in right knee: Secondary | ICD-10-CM | POA: Diagnosis not present

## 2021-01-12 DIAGNOSIS — M0589 Other rheumatoid arthritis with rheumatoid factor of multiple sites: Secondary | ICD-10-CM | POA: Diagnosis not present

## 2021-01-14 ENCOUNTER — Other Ambulatory Visit: Payer: Self-pay

## 2021-01-14 ENCOUNTER — Encounter: Payer: Self-pay | Admitting: Emergency Medicine

## 2021-01-14 ENCOUNTER — Emergency Department
Admission: EM | Admit: 2021-01-14 | Discharge: 2021-01-14 | Disposition: A | Discharge status: Home / Self Care | Payer: Medicare PPO | Attending: Emergency Medicine | Admitting: Emergency Medicine

## 2021-01-14 ENCOUNTER — Emergency Department: Payer: Medicare PPO

## 2021-01-14 DIAGNOSIS — I10 Essential (primary) hypertension: Secondary | ICD-10-CM | POA: Diagnosis not present

## 2021-01-14 DIAGNOSIS — U071 COVID-19: Secondary | ICD-10-CM | POA: Insufficient documentation

## 2021-01-14 DIAGNOSIS — Z79899 Other long term (current) drug therapy: Secondary | ICD-10-CM | POA: Diagnosis not present

## 2021-01-14 DIAGNOSIS — R059 Cough, unspecified: Secondary | ICD-10-CM | POA: Diagnosis not present

## 2021-01-14 LAB — RESP PANEL BY RT-PCR (FLU A&B, COVID) ARPGX2
Influenza A by PCR: NEGATIVE
Influenza B by PCR: NEGATIVE
SARS Coronavirus 2 by RT PCR: POSITIVE — AB

## 2021-01-14 MED ORDER — NIRMATRELVIR/RITONAVIR (PAXLOVID)TABLET: 3.0000 | ORAL_TABLET | Freq: Two times a day (BID) | ORAL | 0 refills | Status: AC

## 2021-01-14 NOTE — ED Triage Notes (Signed)
Pt via POV from home. Pt tested positive for COVID with an at home test today. States she has had a cough and sore throat the past 2 days. Pt's PCP told pt to come here. Pt is A&Ox4 and NAD. Ambulatory to triage.

## 2021-01-14 NOTE — ED Notes (Signed)
Patient declined discharge vital signs. 

## 2021-01-14 NOTE — ED Provider Notes (Signed)
[facility] Emergency Department Provider Note ____________________________________________  Time seen: 1524  I have reviewed the triage vital signs and the nursing notes.  HISTORY  Chief Complaint  Cough and Sore Throat   HPI Wanda Lang is a 70 y.o. female with the below medical history, presents to the ED for evaluation after she had a positive home test for COVID today.  Patient reported 2 days ago she began experience some sore throat, cough, and runny nose, generalized malaise.  She has been previously vaccinated against flu and has had 2 COVID vaccine doses.  Denies any frank chest pain, shortness of breath, nausea, vomiting, or diarrhea.  Past Medical History:  Diagnosis Date   Allergy    Arthritis    Chronic kidney disease    GERD (gastroesophageal reflux disease)    Hypertension    LIVER FUNCTION TESTS, ABNORMAL, HX OF 09/08/2007   Qualifier: Diagnosis of  By: Harlon Ditty CMA (AAMA), Dottie      Patient Active Problem List   Diagnosis Date Noted   Duodenal ulcer 11/12/2019   Stress 11/12/2019   Morbid obesity ([place]) 11/06/2019   Heart murmur 02/06/2018   Chronic diarrhea 10/09/2017   Acid reflux 07/15/2017   Asymptomatic carotid artery stenosis 07/15/2017   Cyst of skin 07/15/2017   Immunocompromised ([place]) 11/09/2016   Allergic rhinitis 11/02/2016   Tremor 11/02/2016   History of hypertension 11/02/2016   Postmenopausal symptoms 07/01/2015   Seasonal allergic rhinitis due to pollen 07/01/2015   Fatty liver 09/08/2007   Rheumatoid arthritis ([place]) 09/08/2007    Past Surgical History:  Procedure Laterality Date   ABDOMINAL HYSTERECTOMY     COLONOSCOPY WITH PROPOFOL N/A 09/04/2018   Procedure: COLONOSCOPY WITH PROPOFOL;  Surgeon: Virgel Manifold, MD;  Location: [facility] ENDOSCOPY;  Service: Endoscopy;  Laterality: N/A;   ESOPHAGOGASTRODUODENOSCOPY (EGD) WITH PROPOFOL N/A 09/04/2018   Procedure: ESOPHAGOGASTRODUODENOSCOPY (EGD) WITH  PROPOFOL;  Surgeon: Virgel Manifold, MD;  Location: [facility] ENDOSCOPY;  Service: Endoscopy;  Laterality: N/A;   LAPAROSCOPY     TURBINATE REDUCTION      Prior to Admission medications   Medication Sig Start Date End Date Taking? Authorizing Provider  nirmatrelvir/ritonavir EUA (PAXLOVID) 20 x 150 MG & 10 x 100MG  TABS Take 3 tablets by mouth 2 (two) times daily for 5 days. Patient GFR is >60. Take nirmatrelvir (150 mg) two tablets twice daily for 5 days and ritonavir (100 mg) one tablet twice daily for 5 days. 01/14/21 01/19/21 Yes Genecis Veley, Dannielle Karvonen, PA-C  Abatacept 125 MG/ML SOSY Inject into the vein.    [provider]  acetaminophen (TYLENOL) 500 MG tablet Take 500 mg by mouth as needed.    [provider]  calcium carbonate 1250 MG capsule Take by mouth.    [provider]  celecoxib (CELEBREX) 200 MG capsule Take 200 mg by mouth daily.    [provider]  Coenzyme Q10 (COQ10) 200 MG CAPS Take 200 mg by mouth daily.    [provider]  Collagen 500 MG CAPS Take by mouth.    [provider]  cyclobenzaprine (FLEXERIL) 5 MG tablet Take 5 mg by mouth as needed.    [provider]  cycloSPORINE (RESTASIS) 0.05 % ophthalmic emulsion Apply to eye.    [provider]  estradiol (VIVELLE-DOT) 0.05 MG/24HR patch Place 1 patch onto the skin 2 (two) times a week.    [provider]  Estradiol 10 MCG TABS vaginal tablet Place vaginally.  [provider]  Flaxseed, Linseed, (FLAXSEED OIL) 1000 MG CAPS Take by mouth.    [provider]  fluticasone (FLONASE) 50 MCG/ACT nasal spray Place 2 sprays into both nostrils daily. 09/20/17   Jodelle Green, FNP  Glucosamine-Chondroitin 250-200 MG CAPS Take by mouth.    [provider]  hydroxychloroquine (PLAQUENIL) 200 MG tablet TAKE 1 TABLET BY MOUTH DAILY FOR 2 WEEKS, THEN INCREASE TO 1 TABLET BY MOUTH TWICE DAILY    [provider]   Loratadine 10 MG CAPS Take 10 mg by mouth as needed.    [provider]  Magnesium 100 MG CAPS Take by mouth.    [provider]  milk thistle 175 MG tablet Take 175 mg by mouth daily.    [provider]  pantoprazole (PROTONIX) 40 MG tablet Take 1 tablet (40 mg total) by mouth daily. 09/04/18 10/04/18  Virgel Manifold, MD  Potassium 99 MG TABS Take 99 mg by mouth 2 (two) times daily.    [provider]  predniSONE (DELTASONE) 10 MG tablet 20 mg. For sinus infection 10/18/17   [provider]  selenium 50 MCG TABS tablet Take 50 mcg by mouth daily.    [provider]  Vitamin D, Ergocalciferol, (DRISDOL) 50000 units CAPS capsule Take 50,000 Units by mouth every 14 (fourteen) days.    [provider]  ZINC MT Use as directed in the mouth or throat.    [provider]    Allergies Penicillins, Amoxicillin, Rosanil cleanser [sulfacetamide sodium-sulfur], Sulfonamide derivatives, and Ylang-ylang [cananga oil (ylang-ylang)]  Family History  Problem Relation Age of Onset   Alcohol abuse Mother    Arthritis Mother    Hyperlipidemia Mother    Heart disease Mother    Stroke Mother    Hypertension Mother    Depression Mother    Anxiety disorder Mother    Arthritis Father    Lung cancer Paternal Grandfather    Kidney disease Paternal Grandfather     Social History Social History   Tobacco Use   Smoking status: Never   Smokeless tobacco: Never  Vaping Use   Vaping Use: Never used  Substance Use Topics   Alcohol use: Yes    Alcohol/week: 1.0 standard drink    Types: 1 Glasses of wine per week   Drug use: No    Review of Systems  Constitutional: Negative for fever. Eyes: Negative for visual changes. ENT: Negative for sore throat.  Reports sinus drainage Cardiovascular: Negative for chest pain. Respiratory: Negative for shortness of breath.  Reports cough as above Gastrointestinal: Negative for abdominal  pain, vomiting and diarrhea. Genitourinary: Negative for dysuria. Musculoskeletal: Negative for back pain. Skin: Negative for rash. Neurological: Negative for headaches, focal weakness or numbness. ____________________________________________  PHYSICAL EXAM:  VITAL SIGNS: ED Triage Vitals  Enc Vitals Group     BP 01/14/21 1301 (!) 148/86     Pulse Rate 01/14/21 1301 (!) 107     Resp 01/14/21 1301 20     Temp 01/14/21 1301 98.7 F (37.1 C)     Temp Source 01/14/21 1301 Oral     SpO2 01/14/21 1301 95 %     Weight 01/14/21 1259 255 lb (115.7 kg)     Height 01/14/21 1259 5\' 4"  (1.626 m)     Head Circumference --      Peak Flow --      Pain Score 01/14/21 1258 0     Pain Loc --  Pain Edu? --      Excl. in [place]? --     Constitutional: Alert and oriented. Well appearing and in no distress. Head: Normocephalic and atraumatic. Eyes: Conjunctivae are normal. Normal extraocular movements Ears: Canals clear. TMs intact bilaterally. Neck: Supple. No thyromegaly. Cardiovascular: Normal rate, regular rhythm. Normal distal pulses.  Systolic murmur noted. Respiratory: Normal respiratory effort. No wheezes/rales/rhonchi. Gastrointestinal: Soft and nontender. No distention. Musculoskeletal: Nontender with normal range of motion in all extremities.  Neurologic:  Normal gait without ataxia. Normal speech and language. No gross focal neurologic deficits are appreciated. Skin:  Skin is warm, dry and intact. No rash noted. Psychiatric: Mood and affect are normal. Patient exhibits appropriate insight and judgment. ____________________________________________    {LABS (pertinent positives/negatives) Labs Reviewed  RESP PANEL BY RT-PCR (FLU A&B, COVID) ARPGX2 - Abnormal; Notable for the following components:      Result Value   SARS Coronavirus 2 by RT PCR POSITIVE (*)    All other components within normal limits  ____________________________________________  {EKG  Vent. rate 67 BPM PR  interval 156 ms QRS duration 78 ms QT/QTcB 394/416 ms P-R-T axes 18 -31 2 ____________________________________________   RADIOLOGY Official radiology report(s): DG Chest 1 View  Result Date: 01/14/2021 CLINICAL DATA:  Cough EXAM: CHEST  1 VIEW COMPARISON:  None. FINDINGS: Cardiac and mediastinal contours are within normal limits for AP technique. Mild scattered linear opacities which are likely due to atelectasis. No focal consolidation. No large pleural effusion or pneumothorax. IMPRESSION: No active disease. Electronically Signed   By: Yetta Glassman M.D.   On: 01/14/2021 15:05   ____________________________________________  PROCEDURES   Procedures ____________________________________________   INITIAL IMPRESSION / ASSESSMENT AND PLAN / ED COURSE  As part of my medical decision making, I reviewed the following data within the [place] reviewed as noted, EKG interpreted NSR, Radiograph reviewed NAD, and Notes from prior ED visits   DDX: covid, influenza, viral URI  Geriatric patient ED evaluation of symptoms concerning for viral etiology.  Patient had a positive home COVID test today, and was prompted to report to the ED by her PCP due to her chronic underlying conditions.  Patient presents in no acute distress with reassuring vital signs and exam does not not indicate any toxic appearance, dehydration, hypoxia.  Patient is inclined to take Paxlovid for her symptoms pending COVID test.  She continue to hydrate to prevent dehydration.  Return precautions have been discussed.  EDDIE KOC was evaluated in Emergency Department on 01/15/2021 for the symptoms described in the history of present illness. She was evaluated in the context of the global COVID-19 pandemic, which necessitated consideration that the patient might be at risk for infection with the SARS-CoV-2 virus that causes COVID-19. Institutional protocols and algorithms that pertain to the  evaluation of patients at risk for COVID-19 are in a state of rapid change based on information released by regulatory bodies including the CDC and federal and state organizations. These policies and algorithms were followed during the patient's care in the ED. ____________________________________________  FINAL CLINICAL IMPRESSION(S) / ED DIAGNOSES  Final diagnoses:  [id number]      Melvenia Needles, PA-C 01/15/21 0003    Carrie Mew, MD 01/18/21 2307

## 2021-01-14 NOTE — Discharge Instructions (Signed)
Continue your home medicines.  Take the prescription Paxlovid as directed.  Continue to rest and hydrate to prevent dehydration.  Follow-up with your primary provider return to the ED if necessary.

## 2021-01-14 NOTE — ED Provider Notes (Incomplete)
[facility] Emergency Department Provider Note  ____________________________________________  Time seen: Approximately 2:14 PM  I have reviewed the triage vital signs and the nursing notes.   HISTORY  Chief Complaint Cough and Sore Throat   HPI Wanda Lang is a 70 y.o. female ***    Past Medical History:  Diagnosis Date   Allergy    Arthritis    Chronic kidney disease    GERD (gastroesophageal reflux disease)    Hypertension    LIVER FUNCTION TESTS, ABNORMAL, HX OF 09/08/2007   Qualifier: Diagnosis of  By: Harlon Ditty CMA (AAMA), Dottie      Patient Active Problem List   Diagnosis Date Noted   Duodenal ulcer 11/12/2019   Stress 11/12/2019   Morbid obesity ([place]) 11/06/2019   Heart murmur 02/06/2018   Chronic diarrhea 10/09/2017   Acid reflux 07/15/2017   Asymptomatic carotid artery stenosis 07/15/2017   Cyst of skin 07/15/2017   Immunocompromised ([place]) 11/09/2016   Allergic rhinitis 11/02/2016   Tremor 11/02/2016   History of hypertension 11/02/2016   Postmenopausal symptoms 07/01/2015   Seasonal allergic rhinitis due to pollen 07/01/2015   Fatty liver 09/08/2007   Rheumatoid arthritis ([place]) 09/08/2007    Past Surgical History:  Procedure Laterality Date   ABDOMINAL HYSTERECTOMY     COLONOSCOPY WITH PROPOFOL N/A 09/04/2018   Procedure: COLONOSCOPY WITH PROPOFOL;  Surgeon: Virgel Manifold, MD;  Location: [facility] ENDOSCOPY;  Service: Endoscopy;  Laterality: N/A;   ESOPHAGOGASTRODUODENOSCOPY (EGD) WITH PROPOFOL N/A 09/04/2018   Procedure: ESOPHAGOGASTRODUODENOSCOPY (EGD) WITH PROPOFOL;  Surgeon: Virgel Manifold, MD;  Location: [facility] ENDOSCOPY;  Service: Endoscopy;  Laterality: N/A;   LAPAROSCOPY     TURBINATE REDUCTION      Prior to Admission medications   Medication Sig Start Date End Date Taking? Authorizing Provider  Abatacept 125 MG/ML SOSY Inject into the vein.    [provider]  acetaminophen (TYLENOL) 500 MG  tablet Take 500 mg by mouth as needed.    [provider]  calcium carbonate 1250 MG capsule Take by mouth.    [provider]  celecoxib (CELEBREX) 200 MG capsule Take 200 mg by mouth daily.    [provider]  Coenzyme Q10 (COQ10) 200 MG CAPS Take 200 mg by mouth daily.    [provider]  Collagen 500 MG CAPS Take by mouth.    [provider]  cyclobenzaprine (FLEXERIL) 5 MG tablet Take 5 mg by mouth as needed.    [provider]  cycloSPORINE (RESTASIS) 0.05 % ophthalmic emulsion Apply to eye.    [provider]  estradiol (VIVELLE-DOT) 0.05 MG/24HR patch Place 1 patch onto the skin 2 (two) times a week.    [provider]  Estradiol 10 MCG TABS vaginal tablet Place vaginally.    [provider]  Flaxseed, Linseed, (FLAXSEED OIL) 1000 MG CAPS Take by mouth.    [provider]  fluticasone (FLONASE) 50 MCG/ACT nasal spray Place 2 sprays into both nostrils daily. 09/20/17   Jodelle Green, FNP  Glucosamine-Chondroitin 250-200 MG CAPS Take by mouth.    [provider]  hydroxychloroquine (PLAQUENIL) 200 MG tablet TAKE 1 TABLET BY MOUTH DAILY FOR 2 WEEKS, THEN INCREASE TO 1 TABLET BY MOUTH TWICE DAILY    [provider]  Loratadine 10 MG CAPS Take 10 mg by mouth as needed.    [provider]  Magnesium 100 MG CAPS Take by mouth.    [provider]  milk thistle 175 MG tablet Take 175 mg by mouth daily.    [provider]  pantoprazole (PROTONIX) 40 MG tablet Take 1 tablet (40 mg total) by mouth daily. 09/04/18 10/04/18  Virgel Manifold, MD  Potassium 99 MG TABS Take 99 mg by mouth 2 (two) times daily.    [provider]  predniSONE (DELTASONE) 10 MG tablet 20 mg. For sinus infection 10/18/17   [provider]  selenium 50 MCG TABS tablet Take 50 mcg by mouth daily.    [provider]  Vitamin D, Ergocalciferol, (DRISDOL) 50000 units  CAPS capsule Take 50,000 Units by mouth every 14 (fourteen) days.    [provider]  ZINC MT Use as directed in the mouth or throat.    [provider]    Allergies Penicillins, Amoxicillin, Rosanil cleanser [sulfacetamide sodium-sulfur], Sulfonamide derivatives, and Ylang-ylang [cananga oil (ylang-ylang)]  Family History  Problem Relation Age of Onset   Alcohol abuse Mother    Arthritis Mother    Hyperlipidemia Mother    Heart disease Mother    Stroke Mother    Hypertension Mother    Depression Mother    Anxiety disorder Mother    Arthritis Father    Lung cancer Paternal Grandfather    Kidney disease Paternal Grandfather     Social History Social History   Tobacco Use   Smoking status: Never   Smokeless tobacco: Never  Vaping Use   Vaping Use: Never used  Substance Use Topics   Alcohol use: Yes    Alcohol/week: 1.0 standard drink    Types: 1 Glasses of wine per week   Drug use: No    Review of Systems Constitutional: *** fever/chills. *** appetite. ENT: *** sore throat. Cardiovascular: Denies chest pain. Respiratory: *** shortness of breath. *** for cough. *** wheezing.  Gastrointestinal: *** nausea,  *** vomiting.  *** diarrhea.  Musculoskeletal: *** for body aches Skin: *** for rash. Neurological: *** for headaches ____________________________________________   PHYSICAL EXAM:  VITAL SIGNS: ED Triage Vitals  Enc Vitals Group     BP 01/14/21 1301 (!) 148/86     Pulse Rate 01/14/21 1301 (!) 107     Resp 01/14/21 1301 20     Temp 01/14/21 1301 98.7 F (37.1 C)     Temp Source 01/14/21 1301 Oral     SpO2 01/14/21 1301 95 %     Weight 01/14/21 1259 255 lb (115.7 kg)     Height 01/14/21 1259 5\' 4"  (1.626 m)     Head Circumference --      Peak Flow --      Pain Score 01/14/21 1258 0     Pain Loc --      Pain Edu? --      Excl. in [place]? --     Constitutional: Alert and oriented. *** appearing and in no acute distress. Eyes:  Conjunctivae are normal. Ears: *** Nose: *** sinus congestion noted; *** rhinnorhea. Mouth/Throat: Mucous membranes are moist.  Oropharynx ***. Tonsils ***. Uvula ***. Neck: No stridor.  Lymphatic: *** cervical lymphadenopathy. Cardiovascular: Normal rate, regular rhythm. Good peripheral circulation. Respiratory: Respirations are even and unlabored.  No retractions. ***. Gastrointestinal: Soft and nontender.  Musculoskeletal: FROM x 4 extremities.  Neurologic:  Normal speech and language. Skin:  Skin is warm, dry and intact. No rash noted. Psychiatric: Mood and affect are normal. Speech and behavior are normal.  ____________________________________________   LABS (all labs ordered are listed, but only abnormal results  are displayed)  Labs Reviewed - No data to display ____________________________________________  EKG  *** ____________________________________________  RADIOLOGY  *** ____________________________________________   PROCEDURES  Procedure(s) performed: None  Critical Care performed: No ____________________________________________   INITIAL IMPRESSION / ASSESSMENT AND PLAN / ED COURSE  70 y.o. female ***    Medications - No data to display  ED Discharge Orders     None        Pertinent labs & imaging results that were available during my care of the patient were reviewed by me and considered in my medical decision making (see chart for details).    If controlled substance prescribed during this visit, 12 month history viewed on the [place] prior to issuing an initial prescription for Schedule II or III opiod. ____________________________________________   FINAL CLINICAL IMPRESSION(S) / ED DIAGNOSES  Final diagnoses:  None    Note:  This document was prepared using Dragon voice recognition software and may include unintentional dictation errors.

## 2021-01-14 NOTE — ED Notes (Signed)
Pt states she was told by her MD office to come to ER due to testing + for covid and being on treatment for rheumatoid arthritis. Pt in NAD, no SOB noted, pt alert and conversing at present.

## 2021-01-23 ENCOUNTER — Telehealth: Payer: Self-pay | Admitting: Family Medicine

## 2021-01-23 NOTE — Telephone Encounter (Signed)
Pt was sent back from access nurse and advised her to be seen. Due to no appt availability pls call pt back to let her know what can be done. All appts with providers from different location have been took. Earliest available is [place] 12/20 at 8AM for VV

## 2021-01-23 NOTE — Telephone Encounter (Signed)
I called and spoke with the patient and she is scheduled to see provider on tomorrow about her positive covid test.  Colbi Schiltz,cma

## 2021-01-23 NOTE — Telephone Encounter (Signed)
Patient tested positive for COVID on 01/14/2021. Patient went to the ED and was given the antiviral medication,that she took. Today patient tested positive for COVID today because her symptoms returned;  head congestion, headache, low grade temp. No available appointments available. Patient was transferred to Access Nurse for advise.

## 2021-01-24 ENCOUNTER — Telehealth (INDEPENDENT_AMBULATORY_CARE_PROVIDER_SITE_OTHER): Payer: Medicare PPO | Admitting: Family Medicine

## 2021-01-24 ENCOUNTER — Encounter: Payer: Self-pay | Admitting: Family Medicine

## 2021-01-24 ENCOUNTER — Other Ambulatory Visit: Payer: Self-pay

## 2021-01-24 DIAGNOSIS — U071 COVID-19: Secondary | ICD-10-CM

## 2021-01-24 NOTE — Progress Notes (Signed)
Virtual Visit via telephone Note  This visit type was conducted due to national recommendations for restrictions regarding the COVID-19 pandemic (e.g. social distancing).  This format is felt to be most appropriate for this patient at this time.  All issues noted in this document were discussed and addressed.  No physical exam was performed (except for noted visual exam findings with Video Visits).   I connected with Wanda Lang today at 12:30 PM EST by telephone and verified that I am speaking with the correct person using two identifiers. Location patient: home Location provider: work  Persons participating in the virtual visit: patient, provider  I discussed the limitations, risks, security and privacy concerns of performing an evaluation and management service by telephone and the availability of in person appointments. I also discussed with the patient that there may be a patient responsible charge related to this service. The patient expressed understanding and agreed to proceed.  Interactive audio and video telecommunications were attempted between this provider and patient, however failed, due to patient having technical difficulties OR patient did not have access to video capability.  We continued and completed visit with audio only.   Reason for visit: same day visit.  HPI: [id number]: Patient notes she tested positive for COVID-19 on 01/14/2021.  She was seen in the emergency department and placed on paxlovid.  She had cough, fever, headache, postnasal drip, and sore throat.  She felt remarkably better after the paxlovid.  Her symptoms completely resolved though she has had recurrence of symptoms on 01/23/2021.  She woke up with significant sinus congestion and a temperature of 100.9 F.  She has been sneezing.  No loss of taste or smell.  No shortness of breath.  She took a COVID antigen test at home yesterday that was positive.  She has been using over-the-counter decongestants,  antihistamines, and Mucinex.   ROS: See pertinent positives and negatives per HPI.  Past Medical History:  Diagnosis Date   Allergy    Arthritis    Chronic kidney disease    GERD (gastroesophageal reflux disease)    Hypertension    LIVER FUNCTION TESTS, ABNORMAL, HX OF 09/08/2007   Qualifier: Diagnosis of  By: Harlon Ditty CMA (AAMA), Dottie      Past Surgical History:  Procedure Laterality Date   ABDOMINAL HYSTERECTOMY     COLONOSCOPY WITH PROPOFOL N/A 09/04/2018   Procedure: COLONOSCOPY WITH PROPOFOL;  Surgeon: Virgel Manifold, MD;  Location: [facility];  Service: Endoscopy;  Laterality: N/A;   ESOPHAGOGASTRODUODENOSCOPY (EGD) WITH PROPOFOL N/A 09/04/2018   Procedure: ESOPHAGOGASTRODUODENOSCOPY (EGD) WITH PROPOFOL;  Surgeon: Virgel Manifold, MD;  Location: [facility]COPY;  Service: Endoscopy;  Laterality: N/A;   LAPAROSCOPY     TURBINATE REDUCTION      Family History  Problem Relation Age of Onset   Alcohol abuse Mother    Arthritis Mother    Hyperlipidemia Mother    Heart disease Mother    Stroke Mother    Hypertension Mother    Depression Mother    Anxiety disorder Mother    Arthritis Father    Lung cancer Paternal Grandfather    Kidney disease Paternal Grandfather     SOCIAL HX: Non-smoker   Current Outpatient Medications:    Abatacept 125 MG/ML SOSY, Inject into the vein., Disp: , Rfl:    acetaminophen (TYLENOL) 500 MG tablet, Take 500 mg by mouth as needed., Disp: , Rfl:    calcium carbonate 1250 MG capsule, Take by mouth., Disp: , Rfl:  celecoxib (CELEBREX) 200 MG capsule, Take 200 mg by mouth daily., Disp: , Rfl:    Coenzyme Q10 (COQ10) 200 MG CAPS, Take 200 mg by mouth daily., Disp: , Rfl:    Collagen 500 MG CAPS, Take by mouth., Disp: , Rfl:    cyclobenzaprine (FLEXERIL) 5 MG tablet, Take 5 mg by mouth as needed., Disp: , Rfl:    cycloSPORINE (RESTASIS) 0.05 % ophthalmic emulsion, Apply to eye., Disp: , Rfl:    estradiol (VIVELLE-DOT) 0.05  MG/24HR patch, Place 1 patch onto the skin 2 (two) times a week., Disp: , Rfl:    Estradiol 10 MCG TABS vaginal tablet, Place vaginally., Disp: , Rfl:    Flaxseed, Linseed, (FLAXSEED OIL) 1000 MG CAPS, Take by mouth., Disp: , Rfl:    fluticasone (FLONASE) 50 MCG/ACT nasal spray, Place 2 sprays into both nostrils daily., Disp: 16 g, Rfl: 6   Glucosamine-Chondroitin 250-200 MG CAPS, Take by mouth., Disp: , Rfl:    hydroxychloroquine (PLAQUENIL) 200 MG tablet, TAKE 1 TABLET BY MOUTH DAILY FOR 2 WEEKS, THEN INCREASE TO 1 TABLET BY MOUTH TWICE DAILY, Disp: , Rfl:    Loratadine 10 MG CAPS, Take 10 mg by mouth as needed., Disp: , Rfl:    Magnesium 100 MG CAPS, Take by mouth., Disp: , Rfl:    milk thistle 175 MG tablet, Take 175 mg by mouth daily., Disp: , Rfl:    Potassium 99 MG TABS, Take 99 mg by mouth 2 (two) times daily., Disp: , Rfl:    predniSONE (DELTASONE) 10 MG tablet, 20 mg. For sinus infection, Disp: , Rfl:    selenium 50 MCG TABS tablet, Take 50 mcg by mouth daily., Disp: , Rfl:    Vitamin D, Ergocalciferol, (DRISDOL) 50000 units CAPS capsule, Take 50,000 Units by mouth every 14 (fourteen) days., Disp: , Rfl:    ZINC MT, Use as directed in the mouth or throat., Disp: , Rfl:    pantoprazole (PROTONIX) 40 MG tablet, Take 1 tablet (40 mg total) by mouth daily., Disp: 30 tablet, Rfl: 1  EXAM: This was a telephone visit and thus no exam was completed.  ASSESSMENT AND PLAN:  Discussed the following assessment and plan:  Problem List Items Addressed This Visit     COVID-19    The patient likely has a rebound infection after taking paxlovid.  Her symptoms are generally mild.  She is not significantly immunosuppressed.  Discussed that treatment would be supportive and she can continue Mucinex, Tylenol, and antihistamines.  Discussed that she can use a decongestant if needed though this should not be a long-term medication.  Discussed that her quarantine would restart given that she had a  positive antigen test.  Advised her quarantine should last through 01/28/2021 as long as she is not having fevers and she is significantly improved.  If her symptoms are not improving or if she is still having fevers she will remain quarantined and let us know at that point.  If she has any significant worsening of her symptoms she will let us know.  She will go to the emergency room if she develops shortness of breath or cough productive of blood or elevated fevers.       Return if symptoms worsen or fail to improve.   I discussed the assessment and treatment plan with the patient. The patient was provided an opportunity to ask questions and all were answered. The patient agreed with the plan and demonstrated an understanding of the instructions.  The patient was advised to call back or seek an in-person evaluation if the symptoms worsen or if the condition fails to improve as anticipated.  I provided 12 minutes of non-face-to-face time during this encounter.   [name], MD

## 2021-01-24 NOTE — Assessment & Plan Note (Signed)
The patient likely has a rebound infection after taking paxlovid.  Her symptoms are generally mild.  She is not significantly immunosuppressed.  Discussed that treatment would be supportive and she can continue Mucinex, Tylenol, and antihistamines.  Discussed that she can use a decongestant if needed though this should not be a long-term medication.  Discussed that her quarantine would restart given that she had a positive antigen test.  Advised her quarantine should last through 01/28/2021 as long as she is not having fevers and she is significantly improved.  If her symptoms are not improving or if she is still having fevers she will remain quarantined and let us know at that point.  If she has any significant worsening of her symptoms she will let us know.  She will go to the emergency room if she develops shortness of breath or cough productive of blood or elevated fevers.

## 2021-01-31 ENCOUNTER — Telehealth: Payer: Self-pay

## 2021-01-31 NOTE — Telephone Encounter (Signed)
° ° °  I called and spoke with the patient and she stated everything started on Friday and she did speak to Access nurse and they advised that she go to urgent care, the one she went to on Saturday was closed and she stated she is much better some sneezing and a little congested but she is much better and will call if she needs anything.  Braelyn Bordonaro,cma

## 2021-02-07 ENCOUNTER — Telehealth: Payer: Self-pay | Admitting: Family Medicine

## 2021-02-07 ENCOUNTER — Other Ambulatory Visit: Payer: Self-pay

## 2021-02-07 ENCOUNTER — Ambulatory Visit
Admission: EM | Admit: 2021-02-07 | Discharge: 2021-02-07 | Disposition: A | Discharge status: Home / Self Care | Payer: Medicare PPO | Attending: Family Medicine | Admitting: Family Medicine

## 2021-02-07 ENCOUNTER — Encounter: Payer: Self-pay | Admitting: Emergency Medicine

## 2021-02-07 DIAGNOSIS — J0110 Acute frontal sinusitis, unspecified: Secondary | ICD-10-CM

## 2021-02-07 MED ORDER — DOXYCYCLINE HYCLATE 100 MG PO CAPS: 100.0000 mg | ORAL_CAPSULE | Freq: Two times a day (BID) | ORAL | 0 refills | Status: DC

## 2021-02-07 NOTE — ED Triage Notes (Signed)
Pt presents with HA and sinus pressure for several weeks. Pt tested positive for covid on 01/14/21

## 2021-02-07 NOTE — Discharge Instructions (Signed)
Recommend taking a Claritin along with the doxycycline to help management of your postnasal drainage.  If any of your symptoms worsen or do not readily improve follow-up with your primary care provider.  If your symptoms become severe go immediately to the ER.

## 2021-02-07 NOTE — Telephone Encounter (Signed)
Patient called back and the front staff informed her that we have no appointments she would need to go to urgent care.  Wanda Lang,cma

## 2021-02-07 NOTE — Telephone Encounter (Signed)
Access nurse advised pt to be seen within 4 hours. Pt was advised PCP is out of office and due to no appt availability. Pt was advised to go to UC or ED. Pt agreed and stated she will go to [facility] urgent care.

## 2021-02-07 NOTE — ED Provider Notes (Signed)
[facility]    CSN: [id number] Arrival date & time: 02/07/21  1128      History   Chief Complaint Chief Complaint  Patient presents with   Headache   Sinus Pressure    HPI Wanda Lang is a 71 y.o. female.   HPI Patient presents for evaluation of frontal head head pressure, sinus congestion, and postnasal drainage over the last few days.  Patient reports that she has had a low-grade temp of 99 intermittently since her positive COVID diagnosis back in December December 2022.  She denies any coughing , shortness of breath, or wheezing. She has not taken any medication with the exception of tylenol and left over morphine without any relief of symptoms.   Past Medical History:  Diagnosis Date   Allergy    Arthritis    Chronic kidney disease    GERD (gastroesophageal reflux disease)    Hypertension    LIVER FUNCTION TESTS, ABNORMAL, HX OF 09/08/2007   Qualifier: Diagnosis of  By: Harlon Ditty CMA (AAMA), Dottie      Patient Active Problem List   Diagnosis Date Noted   COVID-19 01/24/2021   Duodenal ulcer 11/12/2019   Stress 11/12/2019   Morbid obesity ([place]) 11/06/2019   Heart murmur 02/06/2018   Chronic diarrhea 10/09/2017   Acid reflux 07/15/2017   Asymptomatic carotid artery stenosis 07/15/2017   Cyst of skin 07/15/2017   Immunocompromised ([place]) 11/09/2016   Allergic rhinitis 11/02/2016   Tremor 11/02/2016   History of hypertension 11/02/2016   Postmenopausal symptoms 07/01/2015   Seasonal allergic rhinitis due to pollen 07/01/2015   Fatty liver 09/08/2007   Rheumatoid arthritis ([place]) 09/08/2007    Past Surgical History:  Procedure Laterality Date   ABDOMINAL HYSTERECTOMY     COLONOSCOPY WITH PROPOFOL N/A 09/04/2018   Procedure: COLONOSCOPY WITH PROPOFOL;  Surgeon: Virgel Manifold, MD;  Location: [facility];  Service: Endoscopy;  Laterality: N/A;   ESOPHAGOGASTRODUODENOSCOPY (EGD) WITH PROPOFOL N/A 09/04/2018   Procedure:  ESOPHAGOGASTRODUODENOSCOPY (EGD) WITH PROPOFOL;  Surgeon: Virgel Manifold, MD;  Location: [facility];  Service: Endoscopy;  Laterality: N/A;   LAPAROSCOPY     TURBINATE REDUCTION      OB History   No obstetric history on file.      Home Medications    Prior to Admission medications   Medication Sig Start Date End Date Taking? Authorizing Provider  doxycycline (VIBRAMYCIN) 100 MG capsule Take 1 capsule (100 mg total) by mouth 2 (two) times daily. 02/07/21  Yes Scot Jun, FNP  Abatacept 125 MG/ML SOSY Inject into the vein.    [provider]  acetaminophen (TYLENOL) 500 MG tablet Take 500 mg by mouth as needed.    [provider]  calcium carbonate 1250 MG capsule Take by mouth.    [provider]  celecoxib (CELEBREX) 200 MG capsule Take 200 mg by mouth daily.    [provider]  Coenzyme Q10 (COQ10) 200 MG CAPS Take 200 mg by mouth daily.    [provider]  Collagen 500 MG CAPS Take by mouth.    [provider]  cyclobenzaprine (FLEXERIL) 5 MG tablet Take 5 mg by mouth as needed.    [provider]  cycloSPORINE (RESTASIS) 0.05 % ophthalmic emulsion Apply to eye.    [provider]  estradiol (VIVELLE-DOT) 0.05 MG/24HR patch Place 1 patch onto the skin 2 (two) times a week.    [provider]  Estradiol 10 MCG TABS vaginal tablet  Place vaginally.    [provider]  Flaxseed, Linseed, (FLAXSEED OIL) 1000 MG CAPS Take by mouth.    [provider]  fluticasone (FLONASE) 50 MCG/ACT nasal spray Place 2 sprays into both nostrils daily. 09/20/17   Jodelle Green, FNP  Glucosamine-Chondroitin 250-200 MG CAPS Take by mouth.    [provider]  hydroxychloroquine (PLAQUENIL) 200 MG tablet TAKE 1 TABLET BY MOUTH DAILY FOR 2 WEEKS, THEN INCREASE TO 1 TABLET BY MOUTH TWICE DAILY    [provider]  Loratadine 10 MG CAPS Take 10 mg by mouth as needed.    [provider]  Magnesium 100 MG CAPS Take by mouth.    [provider]  milk thistle 175 MG tablet Take 175 mg by mouth daily.    [provider]  pantoprazole (PROTONIX) 40 MG tablet Take 1 tablet (40 mg total) by mouth daily. 09/04/18 10/04/18  Virgel Manifold, MD  Potassium 99 MG TABS Take 99 mg by mouth 2 (two) times daily.    [provider]  predniSONE (DELTASONE) 10 MG tablet 20 mg. For sinus infection 10/18/17   [provider]  selenium 50 MCG TABS tablet Take 50 mcg by mouth daily.    [provider]  Vitamin D, Ergocalciferol, (DRISDOL) 50000 units CAPS capsule Take 50,000 Units by mouth every 14 (fourteen) days.    [provider]  ZINC MT Use as directed in the mouth or throat.    [provider]    Family History Family History  Problem Relation Age of Onset   Alcohol abuse Mother    Arthritis Mother    Hyperlipidemia Mother    Heart disease Mother    Stroke Mother    Hypertension Mother    Depression Mother    Anxiety disorder Mother    Arthritis Father    Lung cancer Paternal Grandfather    Kidney disease Paternal Grandfather     Social History Social History   Tobacco Use   Smoking status: Never   Smokeless tobacco: Never  Vaping Use   Vaping Use: Never used  Substance Use Topics   Alcohol use: Yes    Alcohol/week: 1.0 standard drink    Types: 1 Glasses of wine per week   Drug use: No     Allergies   Penicillins, Amoxicillin, Rosanil cleanser [sulfacetamide sodium-sulfur], Sulfonamide derivatives, and Ylang-ylang [cananga oil (ylang-ylang)]   Review of Systems Review of Systems Pertinent negatives listed in HPI  Physical Exam Triage Vital Signs ED Triage Vitals  Enc Vitals Group     BP      Pulse      Resp      Temp      Temp src      SpO2      Weight      Height      Head Circumference      Peak Flow      Pain Score      Pain Loc      Pain Edu?      Excl. in [place]?     No data found.  Updated Vital Signs BP 129/86 (BP Location: Left Arm)    Pulse 84    Temp 98.4 F (36.9 C) (Oral)    Resp 16    SpO2 96%   Visual Acuity Right Eye Distance:   Left Eye Distance:   Bilateral Distance:    Right Eye Near:   Left Eye Near:  Bilateral Near:     Physical Exam  General Appearance:    Alert, cooperative, no distress  HENT:   Normocephalic, ears normal, nares mucosal edema with congestion, rhinorrhea, oropharynx  patent without exudate or erythema  Eyes:    PERRL, conjunctiva/corneas clear, EOM's intact       Lungs:     Clear to auscultation bilaterally, respirations unlabored  Heart:    Regular rate and rhythm  Neurologic:   Awake, alert, oriented x 3. No apparent focal neurological           defect.     UC Treatments / Results  Labs (all labs ordered are listed, but only abnormal results are displayed) Labs Reviewed - No data to display  EKG   Radiology No results found.  Procedures Procedures (including critical care time)  Medications Ordered in UC Medications - No data to display  Initial Impression / Assessment and Plan / UC Course  I have reviewed the triage vital signs and the nursing notes.  Pertinent labs & imaging results that were available during my care of the patient were reviewed by me and considered in my medical decision making (see chart for details).    Acute frontal sinusitis treatment today with doxycycline also recommend resuming antihistamine Claritin along with the antibiotic.  If any of your symptoms worsen or become severe go immediately to the ER.  Suspect patient is having some post viral symptoms that is causing her to continuously have a low-grade temp.  Overall patient is well-appearing here in clinic today. Final Clinical Impressions(s) / UC Diagnoses   Final diagnoses:  Acute non-recurrent frontal sinusitis     Discharge Instructions      Recommend taking a Claritin along with the doxycycline to  help management of your postnasal drainage.  If any of your symptoms worsen or do not readily improve follow-up with your primary care provider.  If your symptoms become severe go immediately to the ER.     ED Prescriptions     Medication Sig Dispense Auth. Provider   doxycycline (VIBRAMYCIN) 100 MG capsule Take 1 capsule (100 mg total) by mouth 2 (two) times daily. 20 capsule Scot Jun, FNP      PDMP not reviewed this encounter.   Scot Jun, FNP 02/07/21 [phone]

## 2021-02-07 NOTE — Telephone Encounter (Signed)
Pt called in regards to possible sinus infection. Pt has had covid twice and is still experiencing post nasal drip. Pt states she is also experiencing a bit of a fever. Pt states she also has been experiencing a toothache which has caused her to have a terrible migraine. Pt states she is feeling slightly dizzy. Pt was sent to access nurse

## 2021-02-27 DIAGNOSIS — M0589 Other rheumatoid arthritis with rheumatoid factor of multiple sites: Secondary | ICD-10-CM | POA: Diagnosis not present

## 2021-03-02 DIAGNOSIS — M25562 Pain in left knee: Secondary | ICD-10-CM | POA: Diagnosis not present

## 2021-03-02 DIAGNOSIS — M1711 Unilateral primary osteoarthritis, right knee: Secondary | ICD-10-CM | POA: Diagnosis not present

## 2021-03-02 DIAGNOSIS — M1712 Unilateral primary osteoarthritis, left knee: Secondary | ICD-10-CM | POA: Diagnosis not present

## 2021-03-02 DIAGNOSIS — M25561 Pain in right knee: Secondary | ICD-10-CM | POA: Diagnosis not present

## 2021-03-02 DIAGNOSIS — M170 Bilateral primary osteoarthritis of knee: Secondary | ICD-10-CM | POA: Diagnosis not present

## 2021-03-20 ENCOUNTER — Telehealth: Payer: Self-pay

## 2021-03-20 NOTE — Telephone Encounter (Signed)
I called and spoke with the patient, she tested positive at home  and she felt  there was o need for a visit, she stated he symptoms are very mild, she has a pulse ox at home and I informed her to periodically check her Oxygen level and if is less than 90 she needed to go to the Er right away and she understood.  I also informed her that she needed to call if she had any other concerns and I informed her to be isolated for 5 days at home and after the 5 days she needed to wear a mask for 5 more days and she understood.  Mayia Megill,cma

## 2021-03-29 DIAGNOSIS — M0589 Other rheumatoid arthritis with rheumatoid factor of multiple sites: Secondary | ICD-10-CM | POA: Diagnosis not present

## 2021-04-19 DIAGNOSIS — M1711 Unilateral primary osteoarthritis, right knee: Secondary | ICD-10-CM | POA: Diagnosis not present

## 2021-04-19 DIAGNOSIS — M1712 Unilateral primary osteoarthritis, left knee: Secondary | ICD-10-CM | POA: Diagnosis not present

## 2021-04-19 DIAGNOSIS — M170 Bilateral primary osteoarthritis of knee: Secondary | ICD-10-CM | POA: Diagnosis not present

## 2021-04-26 DIAGNOSIS — M1711 Unilateral primary osteoarthritis, right knee: Secondary | ICD-10-CM | POA: Diagnosis not present

## 2021-04-26 DIAGNOSIS — M0589 Other rheumatoid arthritis with rheumatoid factor of multiple sites: Secondary | ICD-10-CM | POA: Diagnosis not present

## 2021-04-26 DIAGNOSIS — M170 Bilateral primary osteoarthritis of knee: Secondary | ICD-10-CM | POA: Diagnosis not present

## 2021-04-26 DIAGNOSIS — M1712 Unilateral primary osteoarthritis, left knee: Secondary | ICD-10-CM | POA: Diagnosis not present

## 2021-05-03 DIAGNOSIS — M170 Bilateral primary osteoarthritis of knee: Secondary | ICD-10-CM | POA: Diagnosis not present

## 2021-05-24 DIAGNOSIS — M2140 Flat foot [pes planus] (acquired), unspecified foot: Secondary | ICD-10-CM | POA: Diagnosis not present

## 2021-05-24 DIAGNOSIS — M150 Primary generalized (osteo)arthritis: Secondary | ICD-10-CM | POA: Diagnosis not present

## 2021-05-24 DIAGNOSIS — M1710 Unilateral primary osteoarthritis, unspecified knee: Secondary | ICD-10-CM | POA: Diagnosis not present

## 2021-05-24 DIAGNOSIS — M549 Dorsalgia, unspecified: Secondary | ICD-10-CM | POA: Diagnosis not present

## 2021-05-24 DIAGNOSIS — J302 Other seasonal allergic rhinitis: Secondary | ICD-10-CM | POA: Diagnosis not present

## 2021-05-24 DIAGNOSIS — M0589 Other rheumatoid arthritis with rheumatoid factor of multiple sites: Secondary | ICD-10-CM | POA: Diagnosis not present

## 2021-05-24 DIAGNOSIS — M25561 Pain in right knee: Secondary | ICD-10-CM | POA: Diagnosis not present

## 2021-05-24 DIAGNOSIS — Z79899 Other long term (current) drug therapy: Secondary | ICD-10-CM | POA: Diagnosis not present

## 2021-06-06 ENCOUNTER — Telehealth: Payer: Self-pay | Admitting: Family Medicine

## 2021-06-06 NOTE — Telephone Encounter (Signed)
Patient is scheduled to see the provider on 06/30/2021 she stated she will do the clearance at that visit. Surgery is not until June.  Wanda Lang,cma  ?

## 2021-06-06 NOTE — Telephone Encounter (Signed)
I received a surgical clearance on this patient. She needs a visit for this. Can you get her scheduled? ?

## 2021-06-08 DIAGNOSIS — R0609 Other forms of dyspnea: Secondary | ICD-10-CM | POA: Diagnosis not present

## 2021-06-08 DIAGNOSIS — Z01810 Encounter for preprocedural cardiovascular examination: Secondary | ICD-10-CM | POA: Insufficient documentation

## 2021-06-08 DIAGNOSIS — I2510 Atherosclerotic heart disease of native coronary artery without angina pectoris: Secondary | ICD-10-CM | POA: Diagnosis not present

## 2021-06-08 DIAGNOSIS — I350 Nonrheumatic aortic (valve) stenosis: Secondary | ICD-10-CM | POA: Diagnosis not present

## 2021-06-08 DIAGNOSIS — I10 Essential (primary) hypertension: Secondary | ICD-10-CM | POA: Diagnosis not present

## 2021-06-08 DIAGNOSIS — Z01818 Encounter for other preprocedural examination: Secondary | ICD-10-CM | POA: Insufficient documentation

## 2021-06-09 DIAGNOSIS — M25511 Pain in right shoulder: Secondary | ICD-10-CM | POA: Diagnosis not present

## 2021-06-12 ENCOUNTER — Emergency Department
Admission: EM | Admit: 2021-06-12 | Discharge: 2021-06-12 | Disposition: A | Discharge status: Home / Self Care | Payer: Medicare PPO | Attending: Emergency Medicine | Admitting: Emergency Medicine

## 2021-06-12 ENCOUNTER — Other Ambulatory Visit: Payer: Self-pay

## 2021-06-12 DIAGNOSIS — D84821 Immunodeficiency due to drugs: Secondary | ICD-10-CM | POA: Diagnosis not present

## 2021-06-12 DIAGNOSIS — G8929 Other chronic pain: Secondary | ICD-10-CM | POA: Diagnosis not present

## 2021-06-12 DIAGNOSIS — M25511 Pain in right shoulder: Secondary | ICD-10-CM | POA: Insufficient documentation

## 2021-06-12 DIAGNOSIS — M7541 Impingement syndrome of right shoulder: Secondary | ICD-10-CM | POA: Diagnosis not present

## 2021-06-12 DIAGNOSIS — Z79899 Other long term (current) drug therapy: Secondary | ICD-10-CM | POA: Insufficient documentation

## 2021-06-12 DIAGNOSIS — Z96652 Presence of left artificial knee joint: Secondary | ICD-10-CM | POA: Diagnosis not present

## 2021-06-12 DIAGNOSIS — X509XXA Other and unspecified overexertion or strenuous movements or postures, initial encounter: Secondary | ICD-10-CM | POA: Insufficient documentation

## 2021-06-12 MED ORDER — LIDOCAINE 5 % EX PTCH
1.0000 | MEDICATED_PATCH | CUTANEOUS | Status: DC
  Administered 2021-06-12: 1 via TRANSDERMAL
  Filled 2021-06-12: qty 1

## 2021-06-12 MED ORDER — ACETAMINOPHEN 500 MG PO TABS
1000.0000 mg | ORAL_TABLET | Freq: Once | ORAL | Status: AC
  Administered 2021-06-12: 1000 mg via ORAL
  Filled 2021-06-12: qty 2

## 2021-06-12 MED ORDER — LIDOCAINE 5 % EX PTCH: 1.0000 | MEDICATED_PATCH | Freq: Two times a day (BID) | CUTANEOUS | 1 refills | Status: AC

## 2021-06-12 NOTE — ED Triage Notes (Signed)
Pt to ED for right shoulder pain since Friday. States has arthritis in shoulder and has been using it a lot last few days. Has tried ice and OTC meds with no relief.  ?

## 2021-06-12 NOTE — ED Notes (Signed)
See triage note    presents with right shoulder pain   denies nay injury  no deformity   ?

## 2021-06-12 NOTE — ED Provider Notes (Signed)
? ?[facility] ?Provider Note ? ? ? Event Date/Time  ? First MD Initiated Contact with Patient 06/12/21 [phone]   ?  (approximate) ? ? ?History  ? ?Shoulder Pain ? ? ?HPI ? ?PSALM ARMAN is a 71 y.o. female who presents to the ED for evaluation of Shoulder Pain ?  ?Patient self-reports a long history of arthritis.  Reports she has had steroid injections in the past to her right shoulder and left knee.  She is due for a left knee replacement. ? ?She reports doing a lot of work in her house and "overdoing it" over the past few days.  She reports acute on chronic atraumatic right shoulder pain.  Reports using OTC lidocaine patches with some improvement.  She presents to the ED requesting a steroid injection of her right shoulder because she has company visiting this week and wants a "quick fix." ? ? ?Physical Exam  ? ?Triage Vital Signs: ?ED Triage Vitals  ?Enc Vitals Group  ?   BP 06/12/21 0938 (!) 130/93  ?   Pulse Rate 06/12/21 0938 85  ?   Resp 06/12/21 0938 20  ?   Temp 06/12/21 0938 98 ?F (36.7 ?C)  ?   Temp src --   ?   SpO2 06/12/21 0938 98 %  ?   Weight 06/12/21 0939 224 lb (101.6 kg)  ?   Height 06/12/21 0939 5' 4.5" (1.638 m)  ?   Head Circumference --   ?   Peak Flow --   ?   Pain Score 06/12/21 0938 2  ?   Pain Loc --   ?   Pain Edu? --   ?   Excl. in [place]? --   ? ? ?Most recent vital signs: ?Vitals:  ? 06/12/21 0938  ?BP: (!) 130/93  ?Pulse: 85  ?Resp: 20  ?Temp: 98 ?F (36.7 ?C)  ?SpO2: 98%  ? ? ?General: Awake, no distress.  ?CV:  Good peripheral perfusion.  ?Resp:  Normal effort.  ?Abd:  No distention.  ?MSK:  No deformity noted.  Some mild tenderness to trapezius and supraspinatus musculature.  Right arm is distally neurovascularly intact.  Some pain with abduction and the empty can test. ?Neuro:  No focal deficits appreciated. ?Other:   ? ? ?ED Results / Procedures / Treatments  ? ?Labs ?(all labs ordered are listed, but only abnormal results are displayed) ?Labs Reviewed - No data  to display ? ?EKG ? ? ?RADIOLOGY ? ? ?Official radiology report(s): ?No results found. ? ?PROCEDURES and INTERVENTIONS: ? ?Procedures ? ?Medications  ?lidocaine (LIDODERM) 5 % 1 patch (1 patch Transdermal Patch Applied 06/12/21 1003)  ?acetaminophen (TYLENOL) tablet 1,000 mg (1,000 mg Oral Given 06/12/21 1002)  ? ? ? ?IMPRESSION / MDM / ASSESSMENT AND PLAN / ED COURSE  ?I reviewed the triage vital signs and the nursing notes. ? ?71 year old female presents to the ED with acute on chronic right shoulder pain, likely overuse MSK strain/spasm.  She has some signs of rotator cuff injury on the right.  No indications for imaging, though I offered an x-ray but she declines.  She is requesting a steroid joint injection, which we do not regularly perform here in the ED.  We discussed nonnarcotic multimodal analgesia, RICE therapy and supportive exercises.  We discussed reaching out to her orthopedist who has performed injections in the past to discuss another steroid injection.  We discussed appropriate return precautions for the ED. ? ?  ? ? ?  FINAL CLINICAL IMPRESSION(S) / ED DIAGNOSES  ? ?Final diagnoses:  ?Acute pain of right shoulder  ? ? ? ?Rx / DC Orders  ? ?ED Discharge Orders   ? ?      Ordered  ?  lidocaine (LIDODERM) 5 %  Every 12 hours       ? 06/12/21 0958  ? ?  ?  ? ?  ? ? ? ?Note:  This document was prepared using Dragon voice recognition software and may include unintentional dictation errors. ?  ?Vladimir Crofts, MD ?06/12/21 1019 ? ?

## 2021-06-12 NOTE — Discharge Instructions (Addendum)
Use Tylenol for pain and fevers.  Up to 1000 mg per dose, up to 4 times per day.  Do not take more than 4000 mg of Tylenol/acetaminophen within 24 hours.. ° °Please use lidocaine patches at your site of pain.  Apply 1 patch at a time, leave on for 12 hours, then remove for 12 hours.  12 hours on, 12 hours off.  Do not apply more than 1 patch at a time. ° °

## 2021-06-21 DIAGNOSIS — M0589 Other rheumatoid arthritis with rheumatoid factor of multiple sites: Secondary | ICD-10-CM | POA: Diagnosis not present

## 2021-06-28 DIAGNOSIS — M1711 Unilateral primary osteoarthritis, right knee: Secondary | ICD-10-CM | POA: Diagnosis not present

## 2021-06-28 DIAGNOSIS — M170 Bilateral primary osteoarthritis of knee: Secondary | ICD-10-CM | POA: Diagnosis not present

## 2021-06-30 ENCOUNTER — Ambulatory Visit: Payer: Medicare PPO | Admitting: Family Medicine

## 2021-07-05 DIAGNOSIS — M25551 Pain in right hip: Secondary | ICD-10-CM | POA: Diagnosis not present

## 2021-07-05 DIAGNOSIS — M1712 Unilateral primary osteoarthritis, left knee: Secondary | ICD-10-CM | POA: Diagnosis not present

## 2021-07-05 NOTE — Patient Instructions (Signed)
DUE TO COVID-19 ONLY TWO VISITORS  (aged 71 and older)  ARE ALLOWED TO COME WITH YOU AND STAY IN THE WAITING ROOM ONLY DURING PRE OP AND PROCEDURE.   **NO VISITORS ARE ALLOWED IN THE SHORT STAY AREA OR RECOVERY ROOM!!**  IF YOU WILL BE ADMITTED INTO THE HOSPITAL YOU ARE ALLOWED ONLY FOUR SUPPORT PEOPLE DURING VISITATION HOURS ONLY (7 AM -8PM)   The support person(s) must pass our screening, gel in and out, and wear a mask at all times, including in the patient's room. Patients must also wear a mask when staff or their support person are in the room. Visitors GUEST BADGE MUST BE WORN VISIBLY  One adult visitor may remain with you overnight and MUST be in the room by 8 P.M.     Your procedure is scheduled on: 07/18/21   Report to [facility] Main Entrance    Report to admitting at 11:35 AM   Call this number if you have problems the morning of surgery [phone]   Do not eat food :After Midnight.   After Midnight you may have the following liquids until 11:20 AM DAY OF SURGERY  Water Black Coffee (sugar ok, NO MILK/CREAM OR CREAMERS)  Tea (sugar ok, NO MILK/CREAM OR CREAMERS) regular and decaf                             Plain Jell-O (NO RED)                                           Fruit ices (not with fruit pulp, NO RED)                                     Popsicles (NO RED)                                                                  Juice: apple, WHITE grape, WHITE cranberry Sports drinks like Gatorade (NO RED) Clear broth(vegetable,chicken,beef)                The day of surgery:  Drink ONE (1) Pre-Surgery Clear Ensure at 11:20 AM the morning of surgery. Drink in one sitting. Do not sip.  This drink was given to you during your hospital  pre-op appointment visit. Nothing else to drink after completing the  Pre-Surgery Clear Ensure.          If you have questions, please contact your surgeon's office.   FOLLOW BOWEL PREP AND ANY ADDITIONAL PRE OP  INSTRUCTIONS YOU RECEIVED FROM YOUR SURGEON'S OFFICE!!!     Oral Hygiene is also important to reduce your risk of infection.                                    Remember - BRUSH YOUR TEETH THE MORNING OF SURGERY WITH YOUR REGULAR TOOTHPASTE   Take these medicines the morning of surgery with A SIP OF WATER: Tylenol  You may not have any metal on your body including hair pins, jewelry, and body piercing             Do not wear make-up, lotions, powders, perfumes, or deodorant  Do not wear nail polish including gel and S&S, artificial/acrylic nails, or any other type of covering on natural nails including finger and toenails. If you have artificial nails, gel coating, etc. that needs to be removed by a nail salon please have this removed prior to surgery or surgery may need to be canceled/ delayed if the surgeon/ anesthesia feels like they are unable to be safely monitored.   Do not shave  48 hours prior to surgery.    Do not bring valuables to the hospital. [place].   Bring small overnight bag day of surgery.    Special Instructions: Bring a copy of your healthcare power of attorney and living will documents         the day of surgery if you haven't scanned them before.              Please read over the following fact sheets you were given: IF YOU HAVE QUESTIONS ABOUT YOUR PRE-OP INSTRUCTIONS PLEASE CALL [place] - Preparing for Surgery Before surgery, you can play an important role.  Because skin is not sterile, your skin needs to be as free of germs as possible.  You can reduce the number of germs on your skin by washing with CHG (chlorahexidine gluconate) soap before surgery.  CHG is an antiseptic cleaner which kills germs and bonds with the skin to continue killing germs even after washing. Please DO NOT use if you have an allergy to CHG or antibacterial soaps.  If your skin becomes  reddened/irritated stop using the CHG and inform your nurse when you arrive at Short Stay. Do not shave (including legs and underarms) for at least 48 hours prior to the first CHG shower.  You may shave your face/neck.  Please follow these instructions carefully:  1.  Shower with CHG Soap the night before surgery and the  morning of surgery.  2.  If you choose to wash your hair, wash your hair first as usual with your normal  shampoo.  3.  After you shampoo, rinse your hair and body thoroughly to remove the shampoo.                             4.  Use CHG as you would any other liquid soap.  You can apply chg directly to the skin and wash.  Gently with a scrungie or clean washcloth.  5.  Apply the CHG Soap to your body ONLY FROM THE NECK DOWN.   Do   not use on face/ open                           Wound or open sores. Avoid contact with eyes, ears mouth and   genitals (private parts).                       Wash face,  Genitals (private parts) with your normal soap.             6.  Wash thoroughly, paying special attention  to the area where your    surgery  will be performed.  7.  Thoroughly rinse your body with warm water from the neck down.  8.  DO NOT shower/wash with your normal soap after using and rinsing off the CHG Soap.                9.  Pat yourself dry with a clean towel.            10.  Wear clean pajamas.            11.  Place clean sheets on your bed the night of your first shower and do not  sleep with pets. Day of Surgery : Do not apply any lotions/deodorants the morning of surgery.  Please wear clean clothes to the hospital/surgery center.  FAILURE TO FOLLOW THESE INSTRUCTIONS MAY RESULT IN THE CANCELLATION OF YOUR SURGERY  PATIENT SIGNATURE_________________________________  NURSE SIGNATURE__________________________________  ________________________________________________________________________   Adam Phenix  An incentive spirometer is a tool that can help  keep your lungs clear and active. This tool measures how well you are filling your lungs with each breath. Taking long deep breaths may help reverse or decrease the chance of developing breathing (pulmonary) problems (especially infection) following: A long period of time when you are unable to move or be active. BEFORE THE PROCEDURE  If the spirometer includes an indicator to show your best effort, your nurse or respiratory therapist will set it to a desired goal. If possible, sit up straight or lean slightly forward. Try not to slouch. Hold the incentive spirometer in an upright position. INSTRUCTIONS FOR USE  Sit on the edge of your bed if possible, or sit up as far as you can in bed or on a chair. Hold the incentive spirometer in an upright position. Breathe out normally. Place the mouthpiece in your mouth and seal your lips tightly around it. Breathe in slowly and as deeply as possible, raising the piston or the ball toward the top of the column. Hold your breath for 3-5 seconds or for as long as possible. Allow the piston or ball to fall to the bottom of the column. Remove the mouthpiece from your mouth and breathe out normally. Rest for a few seconds and repeat Steps 1 through 7 at least 10 times every 1-2 hours when you are awake. Take your time and take a few normal breaths between deep breaths. The spirometer may include an indicator to show your best effort. Use the indicator as a goal to work toward during each repetition. After each set of 10 deep breaths, practice coughing to be sure your lungs are clear. If you have an incision (the cut made at the time of surgery), support your incision when coughing by placing a pillow or rolled up towels firmly against it. Once you are able to get out of bed, walk around indoors and cough well. You may stop using the incentive spirometer when instructed by your caregiver.  RISKS AND COMPLICATIONS Take your time so you do not get dizzy or  light-headed. If you are in pain, you may need to take or ask for pain medication before doing incentive spirometry. It is harder to take a deep breath if you are having pain. AFTER USE Rest and breathe slowly and easily. It can be helpful to keep track of a log of your progress. Your caregiver can provide you with a simple table to help with this. If you are using the spirometer at  home, follow these instructions: [place] IF:  You are having difficultly using the spirometer. You have trouble using the spirometer as often as instructed. Your pain medication is not giving enough relief while using the spirometer. You develop fever of 100.5 F (38.1 C) or higher. SEEK IMMEDIATE MEDICAL CARE IF:  You cough up bloody sputum that had not been present before. You develop fever of 102 F (38.9 C) or greater. You develop worsening pain at or near the incision site. MAKE SURE YOU:  Understand these instructions. Will watch your condition. Will get help right away if you are not doing well or get worse. Document Released: 06/04/2006 Document Revised: 04/16/2011 Document Reviewed: 08/05/2006 Exit[employer] Patient Information 2014 [employer], [place].   ________________________________________________________________________  WHAT IS A BLOOD TRANSFUSION? Blood Transfusion Information  A transfusion is the replacement of blood or some of its parts. Blood is made up of multiple cells which provide different functions. Red blood cells carry oxygen and are used for blood loss replacement. White blood cells fight against infection. Platelets control bleeding. Plasma helps clot blood. Other blood products are available for specialized needs, such as hemophilia or other clotting disorders. BEFORE THE TRANSFUSION  Who gives blood for transfusions?  Healthy volunteers who are fully evaluated to make sure their blood is safe. This is blood bank blood. Transfusion therapy is the safest it has ever  been in the practice of medicine. Before blood is taken from a donor, a complete history is taken to make sure that person has no history of diseases nor engages in risky social behavior (examples are intravenous drug use or sexual activity with multiple partners). The donor's travel history is screened to minimize risk of transmitting infections, such as malaria. The donated blood is tested for signs of infectious diseases, such as HIV and hepatitis. The blood is then tested to be sure it is compatible with you in order to minimize the chance of a transfusion reaction. If you or a relative donates blood, this is often done in anticipation of surgery and is not appropriate for emergency situations. It takes many days to process the donated blood. RISKS AND COMPLICATIONS Although transfusion therapy is very safe and saves many lives, the main dangers of transfusion include:  Getting an infectious disease. Developing a transfusion reaction. This is an allergic reaction to something in the blood you were given. Every precaution is taken to prevent this. The decision to have a blood transfusion has been considered carefully by your caregiver before blood is given. Blood is not given unless the benefits outweigh the risks. AFTER THE TRANSFUSION Right after receiving a blood transfusion, you will usually feel much better and more energetic. This is especially true if your red blood cells have gotten low (anemic). The transfusion raises the level of the red blood cells which carry oxygen, and this usually causes an energy increase. The nurse administering the transfusion will monitor you carefully for complications. HOME CARE INSTRUCTIONS  No special instructions are needed after a transfusion. You may find your energy is better. Speak with your caregiver about any limitations on activity for underlying diseases you may have. SEEK MEDICAL CARE IF:  Your condition is not improving after your transfusion. You  develop redness or irritation at the intravenous (IV) site. SEEK IMMEDIATE MEDICAL CARE IF:  Any of the following symptoms occur over the next 12 hours: Shaking chills. You have a temperature by mouth above 102 F (38.9 C), not controlled by medicine. Chest, back, or  muscle pain. People around you feel you are not acting correctly or are confused. Shortness of breath or difficulty breathing. Dizziness and fainting. You get a rash or develop hives. You have a decrease in urine output. Your urine turns a dark color or changes to pink, red, or brown. Any of the following symptoms occur over the next 10 days: You have a temperature by mouth above 102 F (38.9 C), not controlled by medicine. Shortness of breath. Weakness after normal activity. The white part of the eye turns yellow (jaundice). You have a decrease in the amount of urine or are urinating less often. Your urine turns a dark color or changes to pink, red, or brown. Document Released: 01/20/2000 Document Revised: 04/16/2011 Document Reviewed: 09/08/2007 [facility] Patient Information 2014 [place], [place].  _______________________________________________________________________

## 2021-07-05 NOTE — Progress Notes (Addendum)
COVID Vaccine Completed: yes x4  Date of COVID positive in last 42 days:no  PCP - [name], MD Cardiologist - [name]  Cardiac clearance by [name] 06/08/21 in Epic Medical clearance [name] 07/26/21 chart  Chest x-ray - 01/14/21 Epic EKG - 06/08/21 req [facility] Stress Test - n/a ECHO - 07/13/20 [facility] Cardiac Cath - n/a Pacemaker/ICD device last checked: n/a Spinal Cord Stimulator: n/a  Bowel Prep - no  Sleep Study - n/a CPAP -   Fasting Blood Sugar - n/a Checks Blood Sugar _____ times a day  Blood Thinner Instructions: n/a Aspirin Instructions: Last Dose:  Activity level: Can perform activities of daily living without stopping and without symptoms of chest pain or shortness of breath. Difficulty with stairs due to knee.       Anesthesia review: CAD, HTN, fatty liver  Patient denies shortness of breath, fever, cough and chest pain at PAT appointment   Patient verbalized understanding of instructions that were given to them at the PAT appointment. Patient was also instructed that they will need to review over the PAT instructions again at home before surgery.

## 2021-07-06 ENCOUNTER — Encounter (HOSPITAL_COMMUNITY): Payer: Self-pay

## 2021-07-06 ENCOUNTER — Encounter (HOSPITAL_COMMUNITY)
Admission: RE | Admit: 2021-07-06 | Discharge: 2021-07-06 | Disposition: A | Discharge status: Home / Self Care | Payer: Medicare PPO | Source: Ambulatory Visit | Attending: Orthopedic Surgery | Admitting: Orthopedic Surgery

## 2021-07-06 VITALS — BP 111/69 | HR 90 | Temp 98.7°F | Resp 16 | Ht 64.5 in | Wt 219.0 lb

## 2021-07-06 DIAGNOSIS — I10 Essential (primary) hypertension: Secondary | ICD-10-CM | POA: Diagnosis not present

## 2021-07-06 DIAGNOSIS — M1712 Unilateral primary osteoarthritis, left knee: Secondary | ICD-10-CM | POA: Diagnosis not present

## 2021-07-06 DIAGNOSIS — Z01818 Encounter for other preprocedural examination: Secondary | ICD-10-CM

## 2021-07-06 DIAGNOSIS — Z01812 Encounter for preprocedural laboratory examination: Secondary | ICD-10-CM | POA: Diagnosis not present

## 2021-07-06 DIAGNOSIS — K760 Fatty (change of) liver, not elsewhere classified: Secondary | ICD-10-CM | POA: Diagnosis not present

## 2021-07-06 DIAGNOSIS — Z8679 Personal history of other diseases of the circulatory system: Secondary | ICD-10-CM

## 2021-07-06 HISTORY — DX: Cardiac murmur, unspecified: R01.1

## 2021-07-06 HISTORY — DX: Atherosclerotic heart disease of native coronary artery without angina pectoris: I25.10

## 2021-07-06 LAB — COMPREHENSIVE METABOLIC PANEL
ALT: 14 U/L (ref 0–44)
AST: 13 U/L — ABNORMAL LOW (ref 15–41)
Albumin: 3.9 g/dL (ref 3.5–5.0)
Alkaline Phosphatase: 51 U/L (ref 38–126)
Anion gap: 6 (ref 5–15)
BUN: 19 mg/dL (ref 8–23)
CO2: 27 mmol/L (ref 22–32)
Calcium: 8.8 mg/dL — ABNORMAL LOW (ref 8.9–10.3)
Chloride: 107 mmol/L (ref 98–111)
Creatinine, Ser: 0.79 mg/dL (ref 0.44–1.00)
GFR, Estimated: >60 mL/min (ref >60)
Glucose, Bld: 104 mg/dL — ABNORMAL HIGH (ref 70–99)
Potassium: 4.4 mmol/L (ref 3.5–5.1)
Sodium: 140 mmol/L (ref 135–145)
Total Bilirubin: 0.9 mg/dL (ref 0.3–1.2)
Total Protein: 6.5 g/dL (ref 6.5–8.1)

## 2021-07-06 LAB — CBC
HCT: 45.4 % (ref 36.0–46.0)
Hemoglobin: 14.7 g/dL (ref 12.0–15.0)
MCH: 31.6 pg (ref 26.0–34.0)
MCHC: 32.4 g/dL (ref 30.0–36.0)
MCV: 97.6 fL (ref 80.0–100.0)
Platelets: 167 10*3/uL (ref 150–400)
RBC: 4.65 MIL/uL (ref 3.87–5.11)
RDW: 13.4 % (ref 11.5–15.5)
WBC: 7.6 10*3/uL (ref 4.0–10.5)
nRBC: 0 % (ref 0.0–0.2)

## 2021-07-06 LAB — TYPE AND SCREEN
ABO/RH(D): O POS
Antibody Screen: NEGATIVE

## 2021-07-06 LAB — SURGICAL PCR SCREEN
MRSA, PCR: NEGATIVE
Staphylococcus aureus: POSITIVE — AB

## 2021-07-06 NOTE — Progress Notes (Signed)
[facility]+ results routed to Dr. Olin. 

## 2021-07-07 ENCOUNTER — Ambulatory Visit: Payer: Medicare PPO | Admitting: Family Medicine

## 2021-07-07 ENCOUNTER — Encounter: Payer: Self-pay | Admitting: Family Medicine

## 2021-07-07 VITALS — BP 110/70 | HR 84 | Temp 99.1°F | Ht 64.5 in | Wt 221.2 lb

## 2021-07-07 DIAGNOSIS — M25551 Pain in right hip: Secondary | ICD-10-CM | POA: Diagnosis not present

## 2021-07-07 DIAGNOSIS — M545 Low back pain, unspecified: Secondary | ICD-10-CM | POA: Insufficient documentation

## 2021-07-07 DIAGNOSIS — M5441 Lumbago with sciatica, right side: Secondary | ICD-10-CM | POA: Diagnosis not present

## 2021-07-07 DIAGNOSIS — Z01818 Encounter for other preprocedural examination: Secondary | ICD-10-CM | POA: Diagnosis not present

## 2021-07-07 DIAGNOSIS — R809 Proteinuria, unspecified: Secondary | ICD-10-CM

## 2021-07-07 LAB — POCT URINALYSIS DIPSTICK
Bilirubin, UA: NEGATIVE
Blood, UA: NEGATIVE
Glucose, UA: NEGATIVE
Ketones, UA: NEGATIVE
Leukocytes, UA: NEGATIVE
Nitrite, UA: NEGATIVE
Protein, UA: POSITIVE — AB
Spec Grav, UA: 1.025 (ref 1.010–1.025)
Urobilinogen, UA: 0.2 E.U./dL
pH, UA: 5.5 (ref 5.0–8.0)

## 2021-07-07 LAB — HEMOGLOBIN A1C: Hgb A1c MFr Bld: 5.6 % (ref 4.6–6.5)

## 2021-07-07 LAB — PROTIME-INR
INR: 1 ratio (ref 0.8–1.0)
Prothrombin Time: 11.2 s (ref 9.6–13.1)

## 2021-07-07 NOTE — Assessment & Plan Note (Signed)
Patient presents for preoperative visit.  She reports that she is already seen cardiology for clearance.  Her Lyndel Safe perioperative risk score is 0.2%.  She is at low risk for this surgery.  There are some labs that still need to be completed and if those are acceptable we will complete her form for her surgeon.

## 2021-07-07 NOTE — Progress Notes (Signed)
Wanda Rumps, MD Phone: [phone]  Wanda Lang is a 71 y.o. female who presents today for a preoperative exam.  Patient is having a left knee replacement.  She notes no chest pain or shortness of breath on walking several blocks.  She notes no chest pain or shortness of breath issues at any other time either.  She has lost about 40 pounds in an attempt to get to an appropriate BMI for her surgery.  Low back pain/right hip pain: Patient notes sometime ago she was bending over her bed and ended up with some right low back pain.  It occasionally radiates to just below her right hip.  No numbness.  She does feel as though her right leg might be weaker than the left.  No incontinence.  She also reports some right hip pain and she has seen orthopedics for this.  She is going to contact them today to see if they will do a shot in the hip.  She has been on a steroid taper as well.  Social History   Tobacco Use  Smoking Status Never  Smokeless Tobacco Never    Current Outpatient Medications on File Prior to Visit  Medication Sig Dispense Refill  . acetaminophen (TYLENOL) 500 MG tablet Take 500-1,000 mg by mouth 3 (three) times daily as needed for moderate pain.    Marland Kitchen CALCIUM PO Take 1,200 mg by mouth daily.    . celecoxib (CELEBREX) 200 MG capsule Take 200 mg by mouth daily.    . cycloSPORINE (RESTASIS) 0.05 % ophthalmic emulsion Place 1 drop into both eyes 2 (two) times daily.    Marland Kitchen estradiol (VIVELLE-DOT) 0.05 MG/24HR patch Place 1 patch onto the skin 2 (two) times a week.    . Estradiol 10 MCG TABS vaginal tablet Place 10 mcg vaginally every Monday, Wednesday, and Friday.    . Flaxseed, Linseed, (FLAXSEED OIL) 1200 MG CAPS Take 1,200 mg by mouth daily.    . fluticasone (FLONASE) 50 MCG/ACT nasal spray Place 2 sprays into both nostrils daily. (Patient taking differently: Place 2 sprays into both nostrils daily as needed for allergies.) 16 g 6  . Glucosamine HCl 1000 MG TABS Take 1,000 mg  by mouth daily.    . hydroxychloroquine (PLAQUENIL) 200 MG tablet Take 200 mg by mouth 2 (two) times daily.    Marland Kitchen lidocaine (LIDODERM) 5 % Place 1 patch onto the skin every 12 (twelve) hours. Remove & Discard patch within 12 hours or as directed by MD 10 patch 1  . lidocaine 4 % Place 1 patch onto the skin daily as needed (pain).    . magnesium gluconate (MAGONATE) 500 MG tablet Take 500 mg by mouth 3 (three) times a week.    . Menthol, Topical Analgesic, (BLUE-EMU MAXIMUM STRENGTH EX) Apply 1 application. topically daily.    Marland Kitchen OVER THE COUNTER MEDICATION Take 1 mL by mouth daily. CBD oil    . POTASSIUM PO Take 3 tablets by mouth daily.    . predniSONE (DELTASONE) 10 MG tablet Take 10-30 mg by mouth See admin instructions. Take 30 mg for 2 days, 20 mg for 5 days , then 10 mg until finished    . pseudoephedrine (SUDAFED) 120 MG 12 hr tablet Take 60-120 mg by mouth daily as needed for congestion.    Marland Kitchen tiZANidine (ZANAFLEX) 2 MG tablet Take 2 mg by mouth at bedtime as needed for muscle spasms.    . TURMERIC CURCUMIN PO Take 1 capsule by mouth daily.    Marland Kitchen  TURMERIC CURCUMIN PO Take 1 tablet by mouth daily.    . Vitamin D, Ergocalciferol, (DRISDOL) 50000 units CAPS capsule Take 50,000 Units by mouth every 14 (fourteen) days.    Marland Kitchen zinc gluconate 50 MG tablet Take 50 mg by mouth 3 (three) times a week.     No current facility-administered medications on file prior to visit.     ROS see history of present illness  Objective  Physical Exam Vitals:   07/07/21 1151  BP: 110/70  Pulse: 84  Temp: 99.1 F (37.3 C)  SpO2: 98%    BP Readings from Last 3 Encounters:  07/07/21 110/70  07/06/21 111/69  06/12/21 (!) 130/93   Wt Readings from Last 3 Encounters:  07/07/21 221 lb 3.2 oz (100.3 kg)  07/06/21 219 lb (99.3 kg)  06/12/21 224 lb (101.6 kg)    Physical Exam Constitutional:      General: She is not in acute distress.    Appearance: She is not diaphoretic.  Cardiovascular:     Rate  and Rhythm: Normal rate and regular rhythm.     Heart sounds: Normal heart sounds.  Pulmonary:     Effort: Pulmonary effort is normal.     Breath sounds: Normal breath sounds.  Abdominal:     General: Bowel sounds are normal. There is no distension.     Palpations: Abdomen is soft.     Tenderness: There is no abdominal tenderness.  Musculoskeletal:     Comments: No midline spine tenderness, no midline spine step-off, there is right lumbar muscular tenderness  Skin:    General: Skin is warm and dry.  Neurological:     Mental Status: She is alert.     Comments: 5/5 strength bilateral quads, hamstrings, plantarflexion, and dorsiflexion, sensation to light touch intact bilateral lower extremities     Assessment/Plan: Please see individual problem list.  Problem List Items Addressed This Visit     Low back pain    Likely muscular strain though nerve impingement could be playing a role.  She is just finishing a steroid taper.  She will continue to monitor and follow with her orthopedist.       Preop examination - Primary    Patient presents for preoperative visit.  She reports that she is already seen cardiology for clearance.  Her Lyndel Safe perioperative risk score is 0.2%.  She is at low risk for this surgery.  There are some labs that still need to be completed and if those are acceptable we will complete her form for her surgeon.       Relevant Orders   HgB A1c   POCT Urinalysis Dipstick   Protime-INR   Right hip pain    She will continue to see her orthopedic surgeon for this.        Return for As scheduled.   Wanda Rumps, MD [place]

## 2021-07-07 NOTE — Assessment & Plan Note (Signed)
She will continue to see her orthopedic surgeon for this.

## 2021-07-07 NOTE — Patient Instructions (Signed)
Nice to see you. We will get a little lab work today and if it is acceptable I will send your risk evaluation form in.

## 2021-07-07 NOTE — Assessment & Plan Note (Signed)
Likely muscular strain though nerve impingement could be playing a role.  She is just finishing a steroid taper.  She will continue to monitor and follow with her orthopedist.

## 2021-07-08 LAB — PROTEIN / CREATININE RATIO, URINE
Creatinine, Urine: 158 mg/dL (ref 20–275)
Protein/Creat Ratio: 63 mg/g creat (ref 24–184)
Protein/Creatinine Ratio: 0.063 mg/mg creat (ref 0.024–0.184)
Total Protein, Urine: 10 mg/dL (ref 5–24)

## 2021-07-11 NOTE — Progress Notes (Signed)
Anesthesia Chart Review   Case: [id number] Date/Time: 07/18/21 1405   Procedure: TOTAL KNEE ARTHROPLASTY (Left: Knee)   Anesthesia type: Spinal   Pre-op diagnosis: Left knee osteoarthritis   Location: [facility] / [facility]   Surgeons: Paralee Cancel, MD       DISCUSSION:71 y.o. never smoker with h/o GERD, HTN, CKD, CAD, mild aortic stenosis, left knee OA scheduled for above procedure 07/18/2021 with Dr. Paralee Cancel.   Pt seen by PCP 07/07/2021. Per OV note, "Patient presents for preoperative visit.  She reports that she is already seen cardiology for clearance.  Her Lyndel Safe perioperative risk score is 0.2%.  She is at low risk for this surgery.  There are some labs that still need to be completed and if those are acceptable we will complete her form for her surgeon"  Pt seen by cardiology 06/08/2021. Per OV note, "The patient has been significantly less active due to knee bursitis. She has mild exertional shortness of breath and chronic peripheral edema, without chest pain, orthopnea, or syncope. She reports feeling well overall. 2D echocardiogram 07/13/2020 revealed normal left ventricular function with LVEF greater than 55%, severely calcified mitral annulus, without significant stenosis or regurgitation, and mild-moderate aortic stenosis with valve area 1.1 cm, mean gradient 17.1 mmHg and peak gradient 28.6 mmHg. Patient scheduled for left total knee replacement surgery 07/18/2021. She has no prior history of myocardial infarction, congestive heart failure, stroke, diabetes or chronic kidney disease. Patient should be at acceptable risk for surgery."  Anticipate pt can proceed with planned procedure barring acute status change.   VS: BP 111/69   Pulse 90   Temp 37.1 C (Oral)   Resp 16   Ht 5' 4.5" (1.638 m)   Wt 99.3 kg   SpO2 99%   BMI 37.01 kg/m   PROVIDERS: Leone Haven, MD is PCP   [name], MD is Cardiologist  LABS: Labs reviewed: Acceptable for surgery. (all labs  ordered are listed, but only abnormal results are displayed)  Labs Reviewed  SURGICAL PCR SCREEN - Abnormal; Notable for the following components:      Result Value   Staphylococcus aureus POSITIVE (*)    All other components within normal limits  COMPREHENSIVE METABOLIC PANEL - Abnormal; Notable for the following components:   Glucose, Bld 104 (*)    Calcium 8.8 (*)    AST 13 (*)    All other components within normal limits  CBC  TYPE AND SCREEN     IMAGES:   EKG: 01/16/2021 Rate 67 bpm  NSR LAD Minimal voltage criteria for LVH  CV: Echo 02/13/2018 - Procedure narrative: Transthoracic echocardiography for left    ventricular function evaluation, for right ventricular function    evaluation, and for assessment of valvular function. Image    quality was suboptimal. The study was technically difficult.  - Left ventricle: The cavity size was normal. Wall thickness was    increased in a pattern of mild LVH. Systolic function was normal.    The estimated ejection fraction was in the range of 60% to 65%.    Doppler parameters are consistent with abnormal left ventricular    relaxation (grade 1 diastolic dysfunction).  - Aortic valve: Cusp separation was reduced. There was mild    stenosis. Mean gradient (S): 12 mm Hg.  - Mitral valve: Severely calcified annulus. Mildly thickened    leaflets .  - Right ventricle: The cavity size was mildly dilated.  Past Medical History:  Diagnosis  Date   Allergy    Arthritis    Chronic kidney disease    Coronary artery disease    GERD (gastroesophageal reflux disease)    Heart murmur    Hypertension    LIVER FUNCTION TESTS, ABNORMAL, HX OF 09/08/2007   Qualifier: Diagnosis of  By: Harlon Ditty CMA (AAMA), Dottie      Past Surgical History:  Procedure Laterality Date   ABDOMINAL HYSTERECTOMY     COLONOSCOPY WITH PROPOFOL N/A 09/04/2018   Procedure: COLONOSCOPY WITH PROPOFOL;  Surgeon: Virgel Manifold, MD;  Location: [facility]  ENDOSCOPY;  Service: Endoscopy;  Laterality: N/A;   ESOPHAGOGASTRODUODENOSCOPY (EGD) WITH PROPOFOL N/A 09/04/2018   Procedure: ESOPHAGOGASTRODUODENOSCOPY (EGD) WITH PROPOFOL;  Surgeon: Virgel Manifold, MD;  Location: [facility] ENDOSCOPY;  Service: Endoscopy;  Laterality: N/A;   LAPAROSCOPY     TONSILLECTOMY     TURBINATE REDUCTION      MEDICATIONS:  acetaminophen (TYLENOL) 500 MG tablet   CALCIUM PO   celecoxib (CELEBREX) 200 MG capsule   cycloSPORINE (RESTASIS) 0.05 % ophthalmic emulsion   estradiol (VIVELLE-DOT) 0.05 MG/24HR patch   Estradiol 10 MCG TABS vaginal tablet   Flaxseed, Linseed, (FLAXSEED OIL) 1200 MG CAPS   fluticasone (FLONASE) 50 MCG/ACT nasal spray   Glucosamine HCl 1000 MG TABS   hydroxychloroquine (PLAQUENIL) 200 MG tablet   lidocaine (LIDODERM) 5 %   lidocaine 4 %   magnesium gluconate (MAGONATE) 500 MG tablet   Menthol, Topical Analgesic, (BLUE-EMU MAXIMUM STRENGTH EX)   OVER THE COUNTER MEDICATION   POTASSIUM PO   predniSONE (DELTASONE) 10 MG tablet   pseudoephedrine (SUDAFED) 120 MG 12 hr tablet   tiZANidine (ZANAFLEX) 2 MG tablet   TURMERIC CURCUMIN PO   TURMERIC CURCUMIN PO   Vitamin D, Ergocalciferol, (DRISDOL) 50000 units CAPS capsule   zinc gluconate 50 MG tablet   No current facility-administered medications for this encounter.    Konrad Felix Ward, PA-C [facility]Surgical [facility] [phone]

## 2021-07-12 DIAGNOSIS — M7918 Myalgia, other site: Secondary | ICD-10-CM | POA: Diagnosis not present

## 2021-07-12 DIAGNOSIS — M5136 Other intervertebral disc degeneration, lumbar region: Secondary | ICD-10-CM | POA: Diagnosis not present

## 2021-07-12 DIAGNOSIS — M1712 Unilateral primary osteoarthritis, left knee: Secondary | ICD-10-CM | POA: Diagnosis not present

## 2021-07-12 DIAGNOSIS — M25551 Pain in right hip: Secondary | ICD-10-CM | POA: Diagnosis not present

## 2021-07-13 ENCOUNTER — Ambulatory Visit (INDEPENDENT_AMBULATORY_CARE_PROVIDER_SITE_OTHER): Payer: Medicare PPO

## 2021-07-13 VITALS — Ht 64.5 in | Wt 221.0 lb

## 2021-07-13 DIAGNOSIS — Z0000 Encounter for general adult medical examination without abnormal findings: Secondary | ICD-10-CM

## 2021-07-13 NOTE — Progress Notes (Signed)
Subjective:   Wanda Lang is a 71 y.o. female who presents for Medicare Annual (Subsequent) preventive examination.  Review of Systems    No ROS.  Medicare Wellness Virtual Visit.  Visual/audio telehealth visit, UTA vital signs.   See social history for additional risk factors.   Cardiac Risk Factors include: advanced age (>36mn, >>58women)     Objective:    Today's Vitals   07/13/21 0850  Weight: 221 lb (100.2 kg)  Height: 5' 4.5" (1.638 m)   Body mass index is 37.35 kg/m.     07/13/2021    8:56 AM 07/06/2021   10:32 AM 06/12/2021    9:57 AM 01/14/2021    1:01 PM 07/12/2020   10:56 AM 02/03/2019    9:45 AM 09/04/2018    8:37 AM  Advanced Directives  Does Patient Have a Medical Advance Directive? Yes Yes No No Yes Yes Yes  Type of [place] will   Living will [place] will [place] Does patient want to make changes to medical advance directive? No - Patient declined    No - Patient declined No - Patient declined   Copy of [place] Chart? No - copy requested No - copy requested    No - copy requested   Would patient like information on creating a medical advance directive?   No - Patient declined        Current Medications (verified) Outpatient Encounter Medications as of 07/13/2021  Medication Sig   acetaminophen (TYLENOL) 500 MG tablet Take 500-1,000 mg by mouth 3 (three) times daily as needed for moderate pain.   CALCIUM PO Take 1,200 mg by mouth daily.   celecoxib (CELEBREX) 200 MG capsule Take 200 mg by mouth daily.   cycloSPORINE (RESTASIS) 0.05 % ophthalmic emulsion Place 1 drop into both eyes 2 (two) times daily.   estradiol (VIVELLE-DOT) 0.05 MG/24HR patch Place 1 patch onto the skin 2 (two) times a week.   Estradiol 10 MCG TABS vaginal tablet Place 10 mcg vaginally every Monday, Wednesday, and Friday.    Flaxseed, Linseed, (FLAXSEED OIL) 1200 MG CAPS Take 1,200 mg by mouth daily.   fluticasone (FLONASE) 50 MCG/ACT nasal spray Place 2 sprays into both nostrils daily. (Patient taking differently: Place 2 sprays into both nostrils daily as needed for allergies.)   Glucosamine HCl 1000 MG TABS Take 1,000 mg by mouth daily.   hydroxychloroquine (PLAQUENIL) 200 MG tablet Take 200 mg by mouth 2 (two) times daily.   lidocaine (LIDODERM) 5 % Place 1 patch onto the skin every 12 (twelve) hours. Remove & Discard patch within 12 hours or as directed by MD   lidocaine 4 % Place 1 patch onto the skin daily as needed (pain).   magnesium gluconate (MAGONATE) 500 MG tablet Take 500 mg by mouth 3 (three) times a week.   Menthol, Topical Analgesic, (BLUE-EMU MAXIMUM STRENGTH EX) Apply 1 application. topically daily.   OVER THE COUNTER MEDICATION Take 1 mL by mouth daily. CBD oil   POTASSIUM PO Take 3 tablets by mouth daily.   predniSONE (DELTASONE) 10 MG tablet Take 10-30 mg by mouth See admin instructions. Take 30 mg for 2 days, 20 mg for 5 days , then 10 mg until finished   pseudoephedrine (SUDAFED) 120 MG 12 hr tablet Take 60-120 mg by mouth daily as needed for congestion.   tiZANidine (ZANAFLEX) 2 MG  tablet Take 2 mg by mouth at bedtime as needed for muscle spasms.   TURMERIC CURCUMIN PO Take 1 capsule by mouth daily.   TURMERIC CURCUMIN PO Take 1 tablet by mouth daily.   Vitamin D, Ergocalciferol, (DRISDOL) 50000 units CAPS capsule Take 50,000 Units by mouth every 14 (fourteen) days.   zinc gluconate 50 MG tablet Take 50 mg by mouth 3 (three) times a week.   No facility-administered encounter medications on file as of 07/13/2021.    Allergies (verified) Penicillins, Amoxicillin, Other, Rosanil cleanser [sulfacetamide sodium-sulfur], Sulfonamide derivatives, and Ylang-ylang [cananga oil (ylang-ylang)]   History: Past Medical History:  Diagnosis Date   Allergy    Arthritis    Chronic kidney disease     Coronary artery disease    GERD (gastroesophageal reflux disease)    Heart murmur    Hypertension    LIVER FUNCTION TESTS, ABNORMAL, HX OF 09/08/2007   Qualifier: Diagnosis of  By: Harlon Ditty CMA (AAMA), Dottie     Past Surgical History:  Procedure Laterality Date   ABDOMINAL HYSTERECTOMY     COLONOSCOPY WITH PROPOFOL N/A 09/04/2018   Procedure: COLONOSCOPY WITH PROPOFOL;  Surgeon: Virgel Manifold, MD;  Location: [facility] ENDOSCOPY;  Service: Endoscopy;  Laterality: N/A;   ESOPHAGOGASTRODUODENOSCOPY (EGD) WITH PROPOFOL N/A 09/04/2018   Procedure: ESOPHAGOGASTRODUODENOSCOPY (EGD) WITH PROPOFOL;  Surgeon: Virgel Manifold, MD;  Location: [facility] ENDOSCOPY;  Service: Endoscopy;  Laterality: N/A;   LAPAROSCOPY     TONSILLECTOMY     TURBINATE REDUCTION     Family History  Problem Relation Age of Onset   Alcohol abuse Mother    Arthritis Mother    Hyperlipidemia Mother    Heart disease Mother    Stroke Mother    Hypertension Mother    Depression Mother    Anxiety disorder Mother    Arthritis Father    Lung cancer Paternal Grandfather    Kidney disease Paternal Grandfather    Social History   Socioeconomic History   Marital status: Widowed    Spouse name: Not on file   Number of children: Not on file   Years of education: Not on file   Highest education level: Not on file  Occupational History   Not on file  Tobacco Use   Smoking status: Never   Smokeless tobacco: Never  Vaping Use   Vaping Use: Never used  Substance and Sexual Activity   Alcohol use: Yes    Alcohol/week: 1.0 standard drink of alcohol    Types: 1 Glasses of wine per week   Drug use: No   Sexual activity: Never  Other Topics Concern   Not on file  Social History Narrative   Not on file   Social Determinants of Health   Financial Resource Strain: Low Risk  (07/13/2021)   Overall Financial Resource Strain (CARDIA)    Difficulty of Paying Living Expenses: Not hard at all  Food Insecurity: No Food  Insecurity (07/13/2021)   Hunger Vital Sign    Worried About Running Out of Food in the Last Year: Never true    Ran Out of Food in the Last Year: Never true  Transportation Needs: No Transportation Needs (07/13/2021)   PRAPARE - Hydrologist (Medical): No    Lack of Transportation (Non-Medical): No  Physical Activity: Not on file  Stress: No Stress Concern Present (07/13/2021)   [place]    Feeling of Stress : Not at  all  Social Connections: Unknown (07/13/2021)   Social Connection and Isolation Panel [NHANES]    Frequency of Communication with Friends and Family: More than three times a week    Frequency of Social Gatherings with Friends and Family: More than three times a week    Attends Religious Services: More than 4 times per year    Active Member of [employer] or Organizations: Not on file    Attends Archivist Meetings: Not on file    Marital Status: Widowed    Tobacco Counseling Counseling given: Not Answered   Clinical Intake:  Pre-visit preparation completed: Yes        Diabetes: No  How often do you need to have someone help you when you read instructions, pamphlets, or other written materials from your doctor or pharmacy?: 1 - Never  Interpreter Needed?: No      Activities of Daily Living    07/13/2021    8:57 AM 07/06/2021   10:35 AM  In your present state of health, do you have any difficulty performing the following activities:  Hearing? 0   Vision? 0   Difficulty concentrating or making decisions? 0   Walking or climbing stairs? 1   Comment Chronic L knee pain. Paces self with activity.   Dressing or bathing? 0   Doing errands, shopping? 0 0  Preparing Food and eating ? N   Using the Toilet? N   In the past six months, have you accidently leaked urine? N   Do you have problems with loss of bowel control? N   Managing your Medications? N   Managing your  Finances? N   Housekeeping or managing your Housekeeping? N     Patient Care Team: Leone Haven, MD as PCP - General (Family Medicine)  Indicate any recent Medical Services you may have received from other than [facility] providers in the past year (date may be approximate).     Assessment:   This is a routine wellness examination for Jamyria.  Virtual Visit via Telephone Note  I connected with  TYLIN STRADLEY on 07/13/21 at  8:30 AM EDT by telephone and verified that I am speaking with the correct person using two identifiers.  Persons participating in the virtual visit: patient/Nurse Health Advisor   I discussed the limitations of performing an evaluation and management service by telehealth. We continued and completed visit with audio only.Some vital signs may be absent or patient reported.   Hearing/Vision screen Hearing Screening - Comments:: Patient is able to hear conversational tones without difficulty. No issues reported. Vision Screening - Comments:: Followed by Alphonzo Cruise  Wears corrective lenses  They have seen their ophthalmologist in the last 12 months  Dietary issues and exercise activities discussed: Current Exercise Habits: Home exercise routine, Intensity: Mild Healthy diet   Goals Addressed             This Visit's Progress    Weight loss goal 177lb       Continue [facility] Diet       Depression Screen    07/13/2021    8:55 AM 07/07/2021   11:54 AM 11/30/2020    9:47 AM 07/12/2020   10:52 AM 11/06/2019    4:04 PM 02/03/2019    9:49 AM 02/03/2018    9:09 AM  PHQ 2/9 Scores  PHQ - 2 Score 0 0 0 0 0 0 0    Fall Risk    07/13/2021    8:57 AM  07/07/2021   11:53 AM 11/30/2020    9:47 AM 07/12/2020   10:57 AM 11/06/2019    4:04 PM  Fall Risk   Falls in the past year? 0 0 0 0 0  Number falls in past yr: 0 0 0 0 0  Injury with Fall?  0  0   Risk for fall due to :  No Fall Risks     Follow up Falls evaluation completed Falls evaluation completed  Falls evaluation completed Falls evaluation completed Falls evaluation completed    [place]: Home free of loose throw rugs in walkways, pet beds, electrical cords, etc? Yes  Adequate lighting in your home to reduce risk of falls? Yes   ASSISTIVE DEVICES UTILIZED TO PREVENT FALLS: Use of a cane, walker or w/c? No   TIMED UP AND GO: Was the test performed? No .   Cognitive Function:  Patient is alert and oriented x3.       02/03/2019    9:58 AM  6CIT Screen  What Year? 0 points  What month? 0 points  What time? 0 points  Count back from 20 0 points  Months in reverse 0 points  Repeat phrase 0 points  Total Score 0 points    Immunizations Immunization History  Administered Date(s) Administered   Fluad Quad(high Dose 65+) 11/30/2020   Hep A / Hep B 08/22/2017, 09/24/2017, 02/26/2018   Influenza Inj Mdck Quad Pf 01/02/2016   Influenza, Quadrivalent, Recombinant, Inj, Pf 01/16/2018   Influenza,inj,quad, With Preservative 11/27/2018   Influenza-Unspecified 11/30/2011, 02/19/2013, 01/21/2014   PFIZER(Purple Top)SARS-COV-2 Vaccination 04/02/2019, 04/28/2019, 11/19/2019, 09/09/2020   PNEUMOCOCCAL CONJUGATE-20 11/30/2020   Zoster Recombinat (Shingrix) 12/25/2018, 06/04/2019    TDAP status: Due, Education has been provided regarding the importance of this vaccine. Advised may receive this vaccine at local pharmacy or Health Dept. Aware to provide a copy of the vaccination record if obtained from local pharmacy or Health Dept. Verbalized acceptance and understanding.Deferred.   Screening Tests Health Maintenance  Topic Date Due   COVID-19 Vaccine (5 - Booster for Pfizer series) 07/29/2021 (Originally 11/04/2020)   MAMMOGRAM  10/06/2021 (Originally 06/07/2018)   TETANUS/TDAP  10/06/2021 (Originally 11/22/1969)   INFLUENZA VACCINE  09/05/2021   COLONOSCOPY (Pts 45-77yr Insurance coverage will need to be confirmed)  09/03/2028   Pneumonia Vaccine  71 Years old  Completed   DEXA SCAN  Completed   Hepatitis C Screening  Completed   Zoster Vaccines- Shingrix  Completed   HPV VACCINES  Aged Out   Health Maintenance There are no preventive care reminders to display for this patient.  Mammogram- notes completed annually with GYN. Agrees to update health maintenance record. Deferred.   Lung Cancer Screening: (Low Dose CT Chest recommended if Age 71-80years, 30 pack-year currently smoking OR have quit w/in 15years.) does not qualify.   Vision Screening: Recommended annual ophthalmology exams for early detection of glaucoma and other disorders of the eye.   Dental Screening: Recommended annual dental exams for proper oral hygiene  Community Resource Referral / Chronic Care Management: CRR required this visit?  No   CCM required this visit?  No      Plan:   Keep all routine maintenance appointments.   I have personally reviewed and noted the following in the patient's chart:   Medical and social history Use of alcohol, tobacco or illicit drugs  Current medications and supplements including opioid prescriptions.  Functional ability and status Nutritional status Physical  activity Advanced directives List of other physicians Hospitalizations, surgeries, and ER visits in previous 12 months Vitals Screenings to include cognitive, depression, and falls Referrals and appointments  In addition, I have reviewed and discussed with patient certain preventive protocols, quality metrics, and best practice recommendations. A written personalized care plan for preventive services as well as general preventive health recommendations were provided to patient.     Varney Biles, LPN   05/09/5144

## 2021-07-13 NOTE — Patient Instructions (Addendum)
  Wanda Lang , Thank you for taking time to come for your Medicare Wellness Visit. I appreciate your ongoing commitment to your health goals. Please review the following plan we discussed and let me know if I can assist you in the future.   These are the goals we discussed:  Goals      Weight loss goal 177lb     Continue [facility] Diet        This is a list of the screening recommended for you and due dates:  Health Maintenance  Topic Date Due   COVID-19 Vaccine (5 - Booster for Pfizer series) 07/29/2021*   Mammogram  10/06/2021*   Tetanus Vaccine  10/06/2021*   Flu Shot  09/05/2021   Colon Cancer Screening  09/03/2028   Pneumonia Vaccine  Completed   DEXA scan (bone density measurement)  Completed   Hepatitis C Screening: USPSTF Recommendation to screen - Ages 61-79 yo.  Completed   Zoster (Shingles) Vaccine  Completed   HPV Vaccine  Aged Out  *Topic was postponed. The date shown is not the original due date.

## 2021-07-17 NOTE — H&P (Signed)
TOTAL KNEE ADMISSION H&P  Patient is being admitted for left total knee arthroplasty.  Subjective:  Chief Complaint:left knee pain.  HPI: Wanda Lang, 71 y.o. female, has a history of pain and functional disability in the left knee due to arthritis and has failed non-surgical conservative treatments for greater than 12 weeks to includeNSAID's and/or analgesics, corticosteriod injections, and activity modification.  Onset of symptoms was gradual, starting 2 years ago with gradually worsening course since that time. The patient noted no past surgery on the left knee(s).  Patient currently rates pain in the left knee(s) at 8 out of 10 with activity. Patient has worsening of pain with activity and weight bearing, pain that interferes with activities of daily living, and pain with passive range of motion.  Patient has evidence of joint space narrowing by imaging studies. There is no active infection.  Patient Active Problem List   Diagnosis Date Noted   Preop examination 07/07/2021   Right hip pain 07/07/2021   Low back pain 07/07/2021   COVID-19 01/24/2021   Duodenal ulcer 11/12/2019   Stress 11/12/2019   Morbid obesity ([place]) 11/06/2019   Heart murmur 02/06/2018   Chronic diarrhea 10/09/2017   Acid reflux 07/15/2017   Asymptomatic carotid artery stenosis 07/15/2017   Cyst of skin 07/15/2017   Immunocompromised ([place]) 11/09/2016   Allergic rhinitis 11/02/2016   Tremor 11/02/2016   History of hypertension 11/02/2016   Postmenopausal symptoms 07/01/2015   Seasonal allergic rhinitis due to pollen 07/01/2015   Fatty liver 09/08/2007   Rheumatoid arthritis ([place]) 09/08/2007   Past Medical History:  Diagnosis Date   Allergy    Arthritis    Chronic kidney disease    Coronary artery disease    GERD (gastroesophageal reflux disease)    Heart murmur    Hypertension    LIVER FUNCTION TESTS, ABNORMAL, HX OF 09/08/2007   Qualifier: Diagnosis of  By: Harlon Ditty CMA (AAMA), Dottie       Past Surgical History:  Procedure Laterality Date   ABDOMINAL HYSTERECTOMY     COLONOSCOPY WITH PROPOFOL N/A 09/04/2018   Procedure: COLONOSCOPY WITH PROPOFOL;  Surgeon: Virgel Manifold, MD;  Location: [facility] ENDOSCOPY;  Service: Endoscopy;  Laterality: N/A;   ESOPHAGOGASTRODUODENOSCOPY (EGD) WITH PROPOFOL N/A 09/04/2018   Procedure: ESOPHAGOGASTRODUODENOSCOPY (EGD) WITH PROPOFOL;  Surgeon: Virgel Manifold, MD;  Location: [facility] ENDOSCOPY;  Service: Endoscopy;  Laterality: N/A;   LAPAROSCOPY     TONSILLECTOMY     TURBINATE REDUCTION      No current facility-administered medications for this encounter.   Current Outpatient Medications  Medication Sig Dispense Refill Last Dose   acetaminophen (TYLENOL) 500 MG tablet Take 500-1,000 mg by mouth 3 (three) times daily as needed for moderate pain.      CALCIUM PO Take 1,200 mg by mouth daily.      celecoxib (CELEBREX) 200 MG capsule Take 200 mg by mouth daily.      cycloSPORINE (RESTASIS) 0.05 % ophthalmic emulsion Place 1 drop into both eyes 2 (two) times daily.      estradiol (VIVELLE-DOT) 0.05 MG/24HR patch Place 1 patch onto the skin 2 (two) times a week.      Estradiol 10 MCG TABS vaginal tablet Place 10 mcg vaginally every Monday, Wednesday, and Friday.      Flaxseed, Linseed, (FLAXSEED OIL) 1200 MG CAPS Take 1,200 mg by mouth daily.      fluticasone (FLONASE) 50 MCG/ACT nasal spray Place 2 sprays into both nostrils daily. (Patient taking differently: Place  2 sprays into both nostrils daily as needed for allergies.) 16 g 6    Glucosamine HCl 1000 MG TABS Take 1,000 mg by mouth daily.      hydroxychloroquine (PLAQUENIL) 200 MG tablet Take 200 mg by mouth 2 (two) times daily.      lidocaine 4 % Place 1 patch onto the skin daily as needed (pain).      magnesium gluconate (MAGONATE) 500 MG tablet Take 500 mg by mouth 3 (three) times a week.      Menthol, Topical Analgesic, (BLUE-EMU MAXIMUM STRENGTH EX) Apply 1 application. topically  daily.      OVER THE COUNTER MEDICATION Take 1 mL by mouth daily. CBD oil      POTASSIUM PO Take 3 tablets by mouth daily.      predniSONE (DELTASONE) 10 MG tablet Take 10-30 mg by mouth See admin instructions. Take 30 mg for 2 days, 20 mg for 5 days , then 10 mg until finished      pseudoephedrine (SUDAFED) 120 MG 12 hr tablet Take 60-120 mg by mouth daily as needed for congestion.      tiZANidine (ZANAFLEX) 2 MG tablet Take 2 mg by mouth at bedtime as needed for muscle spasms.      TURMERIC CURCUMIN PO Take 1 capsule by mouth daily.      TURMERIC CURCUMIN PO Take 1 tablet by mouth daily.      Vitamin D, Ergocalciferol, (DRISDOL) 50000 units CAPS capsule Take 50,000 Units by mouth every 14 (fourteen) days.      zinc gluconate 50 MG tablet Take 50 mg by mouth 3 (three) times a week.      lidocaine (LIDODERM) 5 % Place 1 patch onto the skin every 12 (twelve) hours. Remove & Discard patch within 12 hours or as directed by MD 10 patch 1    Allergies  Allergen Reactions   Penicillins Itching   Amoxicillin Itching   Other     Dairy products - belly aches and facial rash    Rosanil Cleanser [Sulfacetamide Sodium-Sulfur] Other (See Comments)    Unknown reaction    Sulfonamide Derivatives     Unknown reaction    Ylang-Ylang [[place] Oil (Ylang-Ylang)] Itching    Social History   Tobacco Use   Smoking status: Never   Smokeless tobacco: Never  Substance Use Topics   Alcohol use: Yes    Alcohol/week: 1.0 standard drink of alcohol    Types: 1 Glasses of wine per week    Family History  Problem Relation Age of Onset   Alcohol abuse Mother    Arthritis Mother    Hyperlipidemia Mother    Heart disease Mother    Stroke Mother    Hypertension Mother    Depression Mother    Anxiety disorder Mother    Arthritis Father    Lung cancer Paternal Grandfather    Kidney disease Paternal Grandfather      Review of Systems  Constitutional:  Negative for chills and fever.  Respiratory:   Negative for cough and shortness of breath.   Cardiovascular:  Negative for chest pain.  Gastrointestinal:  Negative for nausea and vomiting.  Musculoskeletal:  Positive for arthralgias.     Objective:  Physical Exam Well nourished and well developed. General: Alert and oriented x3, cooperative and pleasant, no acute distress. Head: normocephalic, atraumatic, neck supple. Eyes: EOMI.  Musculoskeletal: Left Knee Exam: Mild tenderness to palpation about the medial joint line. AROM 0-120. Patellofemoral crepitus noted. No effusion.  Right Hip: Pain with passive and active ROM of the right hip. Full ROM. No tenderness to palpation about the greater trochanter.  Calves soft and nontender. Motor function intact in LE. Strength 5/5 LE bilaterally. Neuro: Distal pulses 2+. Sensation to light touch intact in LE.  Vital signs in last 24 hours:    Labs:   Estimated body mass index is 37.35 kg/m as calculated from the following:   Height as of 07/13/21: 5' 4.5" (1.638 m).   Weight as of 07/13/21: 100.2 kg.   Imaging Review Plain radiographs demonstrate severe degenerative joint disease of the left knee(s). The overall alignment isneutral. The bone quality appears to be adequate for age and reported activity level.      Assessment/Plan:  End stage arthritis, left knee   The patient history, physical examination, clinical judgment of the provider and imaging studies are consistent with end stage degenerative joint disease of the left knee(s) and total knee arthroplasty is deemed medically necessary. The treatment options including medical management, injection therapy arthroscopy and arthroplasty were discussed at length. The risks and benefits of total knee arthroplasty were presented and reviewed. The risks due to aseptic loosening, infection, stiffness, patella tracking problems, thromboembolic complications and other imponderables were discussed. The patient acknowledged the  explanation, agreed to proceed with the plan and consent was signed. Patient is being admitted for inpatient treatment for surgery, pain control, PT, OT, prophylactic antibiotics, VTE prophylaxis, progressive ambulation and ADL's and discharge planning. The patient is planning to be discharged  home.  Therapy Plans: outpatient therapy at [facility] Disposition: Home with friend Planned DVT Prophylaxis: aspirin '81mg'$  BID DME needed: none PCP: Dr. Caryl Bis, appointment on 6/2 Cardiologist: Dr. Saralyn Pilar, clearance received TXA: IV Allergies: PCN - wheezing as an adult & hives, sulfa - unknown Anesthesia Concerns: none BMI: 38 Last HgbA1c: Not diabetic   Other: - Thinks she took keflex without difficulty - oxycodone, robaxin, celebrex, tylenol - On plaquenil & Orencia - rheumatoid arthritis   Patient's anticipated LOS is less than 2 midnights, meeting these requirements: - Younger than 29 - Lives within 1 hour of care - Has a competent adult at home to recover with post-op recover - NO history of  - Chronic pain requiring opiods  - Diabetes  - Coronary Artery Disease  - Heart failure  - Heart attack  - Stroke  - DVT/VTE  - Cardiac arrhythmia  - Respiratory Failure/COPD  - Renal failure  - Anemia  - Advanced Liver disease  Costella Hatcher, PA-C Orthopedic Surgery [facility] [place] [phone]

## 2021-07-18 ENCOUNTER — Encounter (HOSPITAL_COMMUNITY)
Admission: RE | Disposition: A | Discharge status: Home / Self Care | Payer: Self-pay | Source: Ambulatory Visit | Attending: Orthopedic Surgery

## 2021-07-18 ENCOUNTER — Ambulatory Visit (HOSPITAL_COMMUNITY): Payer: Medicare PPO | Admitting: Physician Assistant

## 2021-07-18 ENCOUNTER — Ambulatory Visit (HOSPITAL_BASED_OUTPATIENT_CLINIC_OR_DEPARTMENT_OTHER): Payer: Medicare PPO | Admitting: Anesthesiology

## 2021-07-18 ENCOUNTER — Encounter (HOSPITAL_COMMUNITY): Payer: Self-pay | Admitting: Orthopedic Surgery

## 2021-07-18 ENCOUNTER — Observation Stay (HOSPITAL_COMMUNITY)
Admission: RE | Admit: 2021-07-18 | Discharge: 2021-07-20 | Disposition: A | Discharge status: Home / Self Care | Payer: Medicare PPO | Source: Ambulatory Visit | Attending: Orthopedic Surgery | Admitting: Orthopedic Surgery

## 2021-07-18 ENCOUNTER — Other Ambulatory Visit: Payer: Self-pay

## 2021-07-18 DIAGNOSIS — M25762 Osteophyte, left knee: Secondary | ICD-10-CM | POA: Insufficient documentation

## 2021-07-18 DIAGNOSIS — Z79899 Other long term (current) drug therapy: Secondary | ICD-10-CM | POA: Diagnosis not present

## 2021-07-18 DIAGNOSIS — Z8616 Personal history of COVID-19: Secondary | ICD-10-CM | POA: Insufficient documentation

## 2021-07-18 DIAGNOSIS — Z791 Long term (current) use of non-steroidal anti-inflammatories (NSAID): Secondary | ICD-10-CM | POA: Insufficient documentation

## 2021-07-18 DIAGNOSIS — I2510 Atherosclerotic heart disease of native coronary artery without angina pectoris: Secondary | ICD-10-CM

## 2021-07-18 DIAGNOSIS — M659 Synovitis and tenosynovitis, unspecified: Secondary | ICD-10-CM | POA: Diagnosis not present

## 2021-07-18 DIAGNOSIS — Z96652 Presence of left artificial knee joint: Secondary | ICD-10-CM

## 2021-07-18 DIAGNOSIS — Z6836 Body mass index (BMI) 36.0-36.9, adult: Secondary | ICD-10-CM | POA: Insufficient documentation

## 2021-07-18 DIAGNOSIS — N189 Chronic kidney disease, unspecified: Secondary | ICD-10-CM | POA: Insufficient documentation

## 2021-07-18 DIAGNOSIS — G8918 Other acute postprocedural pain: Secondary | ICD-10-CM | POA: Diagnosis not present

## 2021-07-18 DIAGNOSIS — I10 Essential (primary) hypertension: Secondary | ICD-10-CM

## 2021-07-18 DIAGNOSIS — M545 Low back pain, unspecified: Secondary | ICD-10-CM | POA: Insufficient documentation

## 2021-07-18 DIAGNOSIS — M25462 Effusion, left knee: Secondary | ICD-10-CM | POA: Insufficient documentation

## 2021-07-18 DIAGNOSIS — I129 Hypertensive chronic kidney disease with stage 1 through stage 4 chronic kidney disease, or unspecified chronic kidney disease: Secondary | ICD-10-CM | POA: Diagnosis not present

## 2021-07-18 DIAGNOSIS — R269 Unspecified abnormalities of gait and mobility: Secondary | ICD-10-CM | POA: Insufficient documentation

## 2021-07-18 DIAGNOSIS — Z8711 Personal history of peptic ulcer disease: Secondary | ICD-10-CM | POA: Insufficient documentation

## 2021-07-18 DIAGNOSIS — K279 Peptic ulcer, site unspecified, unspecified as acute or chronic, without hemorrhage or perforation: Secondary | ICD-10-CM | POA: Diagnosis not present

## 2021-07-18 DIAGNOSIS — M069 Rheumatoid arthritis, unspecified: Secondary | ICD-10-CM | POA: Diagnosis not present

## 2021-07-18 DIAGNOSIS — R011 Cardiac murmur, unspecified: Secondary | ICD-10-CM | POA: Insufficient documentation

## 2021-07-18 DIAGNOSIS — M1712 Unilateral primary osteoarthritis, left knee: Principal | ICD-10-CM | POA: Insufficient documentation

## 2021-07-18 DIAGNOSIS — M25562 Pain in left knee: Secondary | ICD-10-CM | POA: Diagnosis not present

## 2021-07-18 DIAGNOSIS — K219 Gastro-esophageal reflux disease without esophagitis: Secondary | ICD-10-CM | POA: Diagnosis not present

## 2021-07-18 DIAGNOSIS — N289 Disorder of kidney and ureter, unspecified: Secondary | ICD-10-CM | POA: Diagnosis not present

## 2021-07-18 DIAGNOSIS — Z8249 Family history of ischemic heart disease and other diseases of the circulatory system: Secondary | ICD-10-CM | POA: Insufficient documentation

## 2021-07-18 HISTORY — PX: TOTAL KNEE ARTHROPLASTY: SHX125

## 2021-07-18 LAB — ABO/RH: ABO/RH(D): O POS

## 2021-07-18 SURGERY — ARTHROPLASTY, KNEE, TOTAL
Anesthesia: General | Site: Knee | Laterality: Left

## 2021-07-18 MED ORDER — SODIUM CHLORIDE (PF) 0.9 % IJ SOLN
INTRAMUSCULAR | Status: DC | PRN
  Administered 2021-07-18: 30 mL

## 2021-07-18 MED ORDER — FENTANYL CITRATE PF 50 MCG/ML IJ SOSY
PREFILLED_SYRINGE | INTRAMUSCULAR | Status: AC
  Filled 2021-07-18: qty 1

## 2021-07-18 MED ORDER — DOCUSATE SODIUM 100 MG PO CAPS
100.0000 mg | ORAL_CAPSULE | Freq: Two times a day (BID) | ORAL | Status: DC
  Administered 2021-07-18 – 2021-07-20 (×4): 100 mg via ORAL
  Filled 2021-07-18 (×4): qty 1

## 2021-07-18 MED ORDER — PHENYLEPHRINE 80 MCG/ML (10ML) SYRINGE FOR IV PUSH (FOR BLOOD PRESSURE SUPPORT)
PREFILLED_SYRINGE | INTRAVENOUS | Status: AC
  Filled 2021-07-18: qty 10

## 2021-07-18 MED ORDER — METOCLOPRAMIDE HCL 5 MG/ML IJ SOLN
5.0000 mg | Freq: Three times a day (TID) | INTRAMUSCULAR | Status: DC | PRN
  Administered 2021-07-18: 10 mg via INTRAVENOUS
  Filled 2021-07-18: qty 2

## 2021-07-18 MED ORDER — HYDROMORPHONE HCL 2 MG/ML IJ SOLN
INTRAMUSCULAR | Status: AC
  Filled 2021-07-18: qty 1

## 2021-07-18 MED ORDER — OXYCODONE HCL 5 MG PO TABS
10.0000 mg | ORAL_TABLET | ORAL | Status: DC | PRN
  Administered 2021-07-20: 15 mg via ORAL
  Administered 2021-07-20: 10 mg via ORAL
  Filled 2021-07-18: qty 2
  Filled 2021-07-18: qty 3

## 2021-07-18 MED ORDER — SODIUM CHLORIDE (PF) 0.9 % IJ SOLN
INTRAMUSCULAR | Status: AC
  Filled 2021-07-18: qty 30

## 2021-07-18 MED ORDER — LIDOCAINE HCL (CARDIAC) PF 100 MG/5ML IV SOSY
PREFILLED_SYRINGE | INTRAVENOUS | Status: DC | PRN
  Administered 2021-07-18: 80 mg via INTRAVENOUS

## 2021-07-18 MED ORDER — LACTATED RINGERS IV SOLN: INTRAVENOUS | Status: DC

## 2021-07-18 MED ORDER — ASPIRIN 81 MG PO CHEW
81.0000 mg | CHEWABLE_TABLET | Freq: Two times a day (BID) | ORAL | Status: DC
  Administered 2021-07-18 – 2021-07-20 (×4): 81 mg via ORAL
  Filled 2021-07-18 (×4): qty 1

## 2021-07-18 MED ORDER — HYDROXYCHLOROQUINE SULFATE 200 MG PO TABS
200.0000 mg | ORAL_TABLET | Freq: Two times a day (BID) | ORAL | Status: DC
  Administered 2021-07-18 – 2021-07-20 (×4): 200 mg via ORAL
  Filled 2021-07-18 (×4): qty 1

## 2021-07-18 MED ORDER — ONDANSETRON HCL 4 MG/2ML IJ SOLN
4.0000 mg | Freq: Once | INTRAMUSCULAR | Status: DC | PRN
Start: 2021-07-18 — End: 2021-07-18

## 2021-07-18 MED ORDER — HYDROMORPHONE HCL 1 MG/ML IJ SOLN
INTRAMUSCULAR | Status: DC | PRN
  Administered 2021-07-18 (×2): 1 mg via INTRAVENOUS

## 2021-07-18 MED ORDER — PROPOFOL 1000 MG/100ML IV EMUL
INTRAVENOUS | Status: AC
  Filled 2021-07-18: qty 100

## 2021-07-18 MED ORDER — BUPIVACAINE-EPINEPHRINE (PF) 0.25% -1:200000 IJ SOLN
INTRAMUSCULAR | Status: DC | PRN
  Administered 2021-07-18: 30 mL

## 2021-07-18 MED ORDER — FENTANYL CITRATE PF 50 MCG/ML IJ SOSY
25.0000 ug | PREFILLED_SYRINGE | INTRAMUSCULAR | Status: DC | PRN
  Administered 2021-07-18 (×3): 50 ug via INTRAVENOUS

## 2021-07-18 MED ORDER — FENTANYL CITRATE (PF) 100 MCG/2ML IJ SOLN
INTRAMUSCULAR | Status: AC
  Filled 2021-07-18: qty 2

## 2021-07-18 MED ORDER — PHENOL 1.4 % MT LIQD: 1.0000 | OROMUCOSAL | Status: DC | PRN

## 2021-07-18 MED ORDER — CEFAZOLIN SODIUM-DEXTROSE 2-4 GM/100ML-% IV SOLN
2.0000 g | INTRAVENOUS | Status: AC
  Administered 2021-07-18: 2 g via INTRAVENOUS
  Filled 2021-07-18: qty 100

## 2021-07-18 MED ORDER — DEXAMETHASONE SODIUM PHOSPHATE 10 MG/ML IJ SOLN
10.0000 mg | Freq: Once | INTRAMUSCULAR | Status: AC
Start: 2021-07-19 — End: 2021-07-19
  Administered 2021-07-19: 10 mg via INTRAVENOUS
  Filled 2021-07-18: qty 1

## 2021-07-18 MED ORDER — SODIUM CHLORIDE 0.9 % IR SOLN
Status: DC | PRN
  Administered 2021-07-18: 1000 mL

## 2021-07-18 MED ORDER — CEFAZOLIN SODIUM-DEXTROSE 2-4 GM/100ML-% IV SOLN
2.0000 g | Freq: Four times a day (QID) | INTRAVENOUS | Status: AC
  Administered 2021-07-18 – 2021-07-19 (×2): 2 g via INTRAVENOUS
  Filled 2021-07-18 (×2): qty 100

## 2021-07-18 MED ORDER — KETOROLAC TROMETHAMINE 15 MG/ML IJ SOLN
7.5000 mg | Freq: Four times a day (QID) | INTRAMUSCULAR | Status: AC
  Administered 2021-07-18 – 2021-07-19 (×4): 7.5 mg via INTRAVENOUS
  Filled 2021-07-18 (×4): qty 1

## 2021-07-18 MED ORDER — ONDANSETRON HCL 4 MG PO TABS: 4.0000 mg | ORAL_TABLET | Freq: Four times a day (QID) | ORAL | Status: DC | PRN

## 2021-07-18 MED ORDER — HYDROMORPHONE HCL 1 MG/ML IJ SOLN: 0.5000 mg | INTRAMUSCULAR | Status: DC | PRN

## 2021-07-18 MED ORDER — ROCURONIUM BROMIDE 100 MG/10ML IV SOLN
INTRAVENOUS | Status: DC | PRN
  Administered 2021-07-18: 50 mg via INTRAVENOUS

## 2021-07-18 MED ORDER — CYCLOSPORINE 0.05 % OP EMUL
1.0000 [drp] | Freq: Two times a day (BID) | OPHTHALMIC | Status: DC
Start: 2021-07-18 — End: 2021-07-20
  Administered 2021-07-18 – 2021-07-20 (×4): 1 [drp] via OPHTHALMIC
  Filled 2021-07-18 (×4): qty 30

## 2021-07-18 MED ORDER — FENTANYL CITRATE PF 50 MCG/ML IJ SOSY
PREFILLED_SYRINGE | INTRAMUSCULAR | Status: AC
  Filled 2021-07-18: qty 2

## 2021-07-18 MED ORDER — MIDAZOLAM HCL 5 MG/5ML IJ SOLN
INTRAMUSCULAR | Status: DC | PRN
  Administered 2021-07-18: 2 mg via INTRAVENOUS

## 2021-07-18 MED ORDER — ACETAMINOPHEN 500 MG PO TABS
1000.0000 mg | ORAL_TABLET | Freq: Once | ORAL | Status: DC
  Filled 2021-07-18: qty 2

## 2021-07-18 MED ORDER — ORAL CARE MOUTH RINSE: 15.0000 mL | Freq: Once | OROMUCOSAL | Status: AC

## 2021-07-18 MED ORDER — KETOROLAC TROMETHAMINE 30 MG/ML IJ SOLN
INTRAMUSCULAR | Status: AC
  Filled 2021-07-18: qty 1

## 2021-07-18 MED ORDER — CELECOXIB 200 MG PO CAPS
200.0000 mg | ORAL_CAPSULE | Freq: Two times a day (BID) | ORAL | Status: DC
  Administered 2021-07-18 – 2021-07-20 (×4): 200 mg via ORAL
  Filled 2021-07-18 (×4): qty 1

## 2021-07-18 MED ORDER — ACETAMINOPHEN 325 MG PO TABS
325.0000 mg | ORAL_TABLET | Freq: Four times a day (QID) | ORAL | Status: DC | PRN
  Administered 2021-07-19 – 2021-07-20 (×3): 650 mg via ORAL
  Filled 2021-07-18 (×3): qty 2

## 2021-07-18 MED ORDER — BISACODYL 10 MG RE SUPP: 10.0000 mg | Freq: Every day | RECTAL | Status: DC | PRN

## 2021-07-18 MED ORDER — FENTANYL CITRATE PF 50 MCG/ML IJ SOSY
50.0000 ug | PREFILLED_SYRINGE | INTRAMUSCULAR | Status: DC
  Administered 2021-07-18: 50 ug via INTRAVENOUS
  Filled 2021-07-18: qty 2

## 2021-07-18 MED ORDER — DEXAMETHASONE SODIUM PHOSPHATE 10 MG/ML IJ SOLN
INTRAMUSCULAR | Status: AC
Start: 2021-07-18 — End: ?
  Filled 2021-07-18: qty 1

## 2021-07-18 MED ORDER — METHOCARBAMOL 500 MG IVPB - SIMPLE MED
INTRAVENOUS | Status: AC
  Filled 2021-07-18: qty 50

## 2021-07-18 MED ORDER — ONDANSETRON HCL 4 MG/2ML IJ SOLN
INTRAMUSCULAR | Status: AC
  Filled 2021-07-18: qty 2

## 2021-07-18 MED ORDER — LABETALOL HCL 5 MG/ML IV SOLN
INTRAVENOUS | Status: DC | PRN
  Administered 2021-07-18 (×4): 5 mg via INTRAVENOUS

## 2021-07-18 MED ORDER — METHOCARBAMOL 500 MG PO TABS
500.0000 mg | ORAL_TABLET | Freq: Four times a day (QID) | ORAL | Status: DC | PRN
  Administered 2021-07-19 – 2021-07-20 (×3): 500 mg via ORAL
  Filled 2021-07-18 (×3): qty 1

## 2021-07-18 MED ORDER — SODIUM CHLORIDE 0.9 % IV SOLN: INTRAVENOUS | Status: DC

## 2021-07-18 MED ORDER — KETOROLAC TROMETHAMINE 30 MG/ML IJ SOLN
INTRAMUSCULAR | Status: DC | PRN
  Administered 2021-07-18: 30 mg

## 2021-07-18 MED ORDER — ROPIVACAINE HCL 5 MG/ML IJ SOLN
INTRAMUSCULAR | Status: DC | PRN
  Administered 2021-07-18: 20 mL via PERINEURAL

## 2021-07-18 MED ORDER — BUPIVACAINE-EPINEPHRINE (PF) 0.25% -1:200000 IJ SOLN
INTRAMUSCULAR | Status: AC
  Filled 2021-07-18: qty 30

## 2021-07-18 MED ORDER — FENTANYL CITRATE (PF) 100 MCG/2ML IJ SOLN
INTRAMUSCULAR | Status: DC | PRN
  Administered 2021-07-18 (×4): 50 ug via INTRAVENOUS

## 2021-07-18 MED ORDER — PHENYLEPHRINE 80 MCG/ML (10ML) SYRINGE FOR IV PUSH (FOR BLOOD PRESSURE SUPPORT)
PREFILLED_SYRINGE | INTRAVENOUS | Status: DC | PRN
  Administered 2021-07-18: 80 ug via INTRAVENOUS

## 2021-07-18 MED ORDER — METOCLOPRAMIDE HCL 5 MG PO TABS: 5.0000 mg | ORAL_TABLET | Freq: Three times a day (TID) | ORAL | Status: DC | PRN

## 2021-07-18 MED ORDER — SUGAMMADEX SODIUM 200 MG/2ML IV SOLN
INTRAVENOUS | Status: DC | PRN
  Administered 2021-07-18: 200 mg via INTRAVENOUS

## 2021-07-18 MED ORDER — TRANEXAMIC ACID-NACL 1000-0.7 MG/100ML-% IV SOLN
1000.0000 mg | Freq: Once | INTRAVENOUS | Status: AC
  Administered 2021-07-18: 1000 mg via INTRAVENOUS
  Filled 2021-07-18: qty 100

## 2021-07-18 MED ORDER — METHOCARBAMOL 500 MG IVPB - SIMPLE MED
500.0000 mg | Freq: Four times a day (QID) | INTRAVENOUS | Status: DC | PRN
  Administered 2021-07-18: 500 mg via INTRAVENOUS

## 2021-07-18 MED ORDER — MENTHOL 3 MG MT LOZG: 1.0000 | LOZENGE | OROMUCOSAL | Status: DC | PRN

## 2021-07-18 MED ORDER — FERROUS SULFATE 325 (65 FE) MG PO TABS
325.0000 mg | ORAL_TABLET | Freq: Three times a day (TID) | ORAL | Status: DC
  Administered 2021-07-19 – 2021-07-20 (×4): 325 mg via ORAL
  Filled 2021-07-18 (×4): qty 1

## 2021-07-18 MED ORDER — POVIDONE-IODINE 10 % EX SWAB: 2.0000 "application " | Freq: Once | CUTANEOUS | Status: DC

## 2021-07-18 MED ORDER — FLUTICASONE PROPIONATE 50 MCG/ACT NA SUSP: 2.0000 | Freq: Every day | NASAL | Status: DC | PRN

## 2021-07-18 MED ORDER — POLYETHYLENE GLYCOL 3350 17 G PO PACK
17.0000 g | PACK | Freq: Every day | ORAL | Status: DC | PRN
  Administered 2021-07-20: 17 g via ORAL
  Filled 2021-07-18: qty 1

## 2021-07-18 MED ORDER — DEXAMETHASONE SODIUM PHOSPHATE 10 MG/ML IJ SOLN
8.0000 mg | Freq: Once | INTRAMUSCULAR | Status: AC
  Administered 2021-07-18: 10 mg via INTRAVENOUS

## 2021-07-18 MED ORDER — MIDAZOLAM HCL 2 MG/2ML IJ SOLN
INTRAMUSCULAR | Status: AC
  Filled 2021-07-18: qty 2

## 2021-07-18 MED ORDER — ONDANSETRON HCL 4 MG/2ML IJ SOLN
4.0000 mg | Freq: Four times a day (QID) | INTRAMUSCULAR | Status: DC | PRN
  Administered 2021-07-18: 4 mg via INTRAVENOUS
  Filled 2021-07-18: qty 2

## 2021-07-18 MED ORDER — PROPOFOL 10 MG/ML IV BOLUS
INTRAVENOUS | Status: DC | PRN
  Administered 2021-07-18: 140 mg via INTRAVENOUS

## 2021-07-18 MED ORDER — CHLORHEXIDINE GLUCONATE 0.12 % MT SOLN
15.0000 mL | Freq: Once | OROMUCOSAL | Status: AC
  Administered 2021-07-18: 15 mL via OROMUCOSAL

## 2021-07-18 MED ORDER — TRANEXAMIC ACID-NACL 1000-0.7 MG/100ML-% IV SOLN
1000.0000 mg | INTRAVENOUS | Status: AC
  Administered 2021-07-18: 1000 mg via INTRAVENOUS
  Filled 2021-07-18: qty 100

## 2021-07-18 MED ORDER — OXYCODONE HCL 5 MG PO TABS
5.0000 mg | ORAL_TABLET | ORAL | Status: DC | PRN
  Administered 2021-07-19 (×4): 5 mg via ORAL
  Administered 2021-07-19: 10 mg via ORAL
  Filled 2021-07-18 (×5): qty 1
  Filled 2021-07-18: qty 2
  Filled 2021-07-18: qty 1

## 2021-07-18 MED ORDER — PROPOFOL 10 MG/ML IV BOLUS
INTRAVENOUS | Status: AC
  Filled 2021-07-18: qty 20

## 2021-07-18 MED ORDER — DIPHENHYDRAMINE HCL 12.5 MG/5ML PO ELIX: 12.5000 mg | ORAL_SOLUTION | ORAL | Status: DC | PRN

## 2021-07-18 SURGICAL SUPPLY — 58 items
ATTUNE MED ANAT PAT 32 KNEE (Knees) ×1 IMPLANT
ATTUNE PS FEM LT SZ 4 CEM KNEE (Femur) ×1 IMPLANT
ATTUNE PSRP INSR SZ4 7 KNEE (Insert) ×1 IMPLANT
BAG COUNTER SPONGE SURGICOUNT (BAG) ×1 IMPLANT
BAG ZIPLOCK 12X15 (MISCELLANEOUS) ×1 IMPLANT
BASEPLATE TIBIAL ROTATING SZ 4 (Knees) ×1 IMPLANT
BLADE SAW SGTL 11.0X1.19X90.0M (BLADE) ×1 IMPLANT
BLADE SAW SGTL 13.0X1.19X90.0M (BLADE) ×2 IMPLANT
BNDG ELASTIC 6X5.8 VLCR STR LF (GAUZE/BANDAGES/DRESSINGS) ×2 IMPLANT
BOWL SMART MIX CTS (DISPOSABLE) ×2 IMPLANT
BSPLAT TIB 4 CMNT ROT PLAT STR (Knees) ×1 IMPLANT
CEMENT HV SMART SET (Cement) ×2 IMPLANT
CUFF TOURN SGL QUICK 34 (TOURNIQUET CUFF) ×2
CUFF TRNQT CYL 34X4.125X (TOURNIQUET CUFF) ×1 IMPLANT
DERMABOND ADVANCED (GAUZE/BANDAGES/DRESSINGS) ×1
DERMABOND ADVANCED .7 DNX12 (GAUZE/BANDAGES/DRESSINGS) ×1 IMPLANT
DRAPE SHEET LG 3/4 BI-LAMINATE (DRAPES) ×2 IMPLANT
DRAPE U-SHAPE 47X51 STRL (DRAPES) ×2 IMPLANT
DRESSING AQUACEL AG SP 3.5X10 (GAUZE/BANDAGES/DRESSINGS) ×1 IMPLANT
DRSG AQUACEL AG SP 3.5X10 (GAUZE/BANDAGES/DRESSINGS) ×2
DURAPREP 26ML APPLICATOR (WOUND CARE) ×4 IMPLANT
ELECT REM PT RETURN 15FT ADLT (MISCELLANEOUS) ×2 IMPLANT
FACESHIELD WRAPAROUND (MASK) ×2 IMPLANT
FACESHIELD WRAPAROUND OR TEAM (MASK) ×1 IMPLANT
GLOVE BIO SURGEON STRL SZ 6 (GLOVE) ×2 IMPLANT
GLOVE BIOGEL PI IND STRL 6.5 (GLOVE) ×1 IMPLANT
GLOVE BIOGEL PI IND STRL 7.5 (GLOVE) ×1 IMPLANT
GLOVE BIOGEL PI INDICATOR 6.5 (GLOVE) ×1
GLOVE BIOGEL PI INDICATOR 7.5 (GLOVE) ×1
GLOVE ORTHO TXT STRL SZ7.5 (GLOVE) ×4 IMPLANT
GOWN STRL REUS W/ TWL LRG LVL3 (GOWN DISPOSABLE) ×3 IMPLANT
GOWN STRL REUS W/TWL LRG LVL3 (GOWN DISPOSABLE) ×6
HANDPIECE INTERPULSE COAX TIP (DISPOSABLE) ×2
HOLDER FOLEY CATH W/STRAP (MISCELLANEOUS) IMPLANT
KIT TURNOVER KIT A (KITS) IMPLANT
MANIFOLD NEPTUNE II (INSTRUMENTS) ×2 IMPLANT
NDL SAFETY ECLIPSE 18X1.5 (NEEDLE) IMPLANT
NEEDLE HYPO 18GX1.5 SHARP (NEEDLE)
NS IRRIG 1000ML POUR BTL (IV SOLUTION) ×2 IMPLANT
PACK TOTAL KNEE CUSTOM (KITS) ×2 IMPLANT
PROTECTOR NERVE ULNAR (MISCELLANEOUS) ×2 IMPLANT
SET HNDPC FAN SPRY TIP SCT (DISPOSABLE) ×1 IMPLANT
SET PAD KNEE POSITIONER (MISCELLANEOUS) ×2 IMPLANT
SPIKE FLUID TRANSFER (MISCELLANEOUS) ×4 IMPLANT
SUT MNCRL AB 4-0 PS2 18 (SUTURE) ×2 IMPLANT
SUT STRATAFIX PDS+ 0 24IN (SUTURE) ×2 IMPLANT
SUT VIC AB 1 CT1 36 (SUTURE) ×2 IMPLANT
SUT VIC AB 2-0 CT1 27 (SUTURE) ×6
SUT VIC AB 2-0 CT1 TAPERPNT 27 (SUTURE) ×2 IMPLANT
SYR 10ML LL (SYRINGE) ×1 IMPLANT
SYR 3ML LL SCALE MARK (SYRINGE) ×2 IMPLANT
TOWEL GREEN STERILE FF (TOWEL DISPOSABLE) ×2 IMPLANT
TRAY CATH INTERMITTENT SS 16FR (CATHETERS) ×1 IMPLANT
TRAY FOLEY MTR SLVR 14FR STAT (SET/KITS/TRAYS/PACK) ×1 IMPLANT
TRAY FOLEY MTR SLVR 16FR STAT (SET/KITS/TRAYS/PACK) ×1 IMPLANT
TUBE SUCTION HIGH CAP CLEAR NV (SUCTIONS) ×2 IMPLANT
WATER STERILE IRR 1000ML POUR (IV SOLUTION) ×4 IMPLANT
WRAP KNEE MAXI GEL POST OP (GAUZE/BANDAGES/DRESSINGS) ×2 IMPLANT

## 2021-07-18 NOTE — Anesthesia Postprocedure Evaluation (Signed)
Anesthesia Post Note  Patient: Wanda Lang  Procedure(s) Performed: TOTAL KNEE ARTHROPLASTY (Left: Knee)     Patient location during evaluation: PACU Anesthesia Type: General Level of consciousness: awake and alert Pain management: pain level controlled Vital Signs Assessment: post-procedure vital signs reviewed and stable Respiratory status: spontaneous breathing, nonlabored ventilation, respiratory function stable and patient connected to nasal cannula oxygen Cardiovascular status: blood pressure returned to baseline and stable Postop Assessment: no apparent nausea or vomiting Anesthetic complications: no   No notable events documented.  Last Vitals:  Vitals:   07/18/21 1530 07/18/21 1538  BP: 129/70 (!) 156/73  Pulse: 69 78  Resp: 18 14  Temp: (!) 36.4 C (!) 36.4 C  SpO2: 100% 100%    Last Pain:  Vitals:   07/18/21 1538  TempSrc:   PainSc: [place]

## 2021-07-18 NOTE — Progress Notes (Signed)
AssistedDr. Gifford Shave with left, adductor canal block. Side rails up, monitors on throughout procedure. See vital signs in flow sheet. Tolerated Procedure well.

## 2021-07-18 NOTE — Care Plan (Signed)
Ortho Bundle Case Management Note  Patient Details  Name: SAFIA PANZER MRN: [id number] Date of Birth: 01-20-1951  L TKA on 07-18-21 DCP: Home with friend DME: No needs, has a RW PT: [employer] on 07-21-21    DME Arranged:  N/A DME Agency:     HH Arranged:    [place] Agency:     Additional Comments: Please contact me with any questions of if this plan should need to change.  Marianne Sofia, RN,CCM [facility]  [phone] 07/18/2021, 3:57 PM

## 2021-07-18 NOTE — Op Note (Signed)
NAME:  Wanda Lang                      MEDICAL RECORD NO.:  [id number]                             FACILITY:  [facility]      PHYSICIAN:  Pietro Cassis. Alvan Dame, M.D.  DATE OF BIRTH:  Nov 04, 1950      DATE OF PROCEDURE:  07/18/2021                                     OPERATIVE REPORT         PREOPERATIVE DIAGNOSIS:  Left knee osteoarthritis.      POSTOPERATIVE DIAGNOSIS:  Left knee osteoarthritis.      FINDINGS:  The patient was noted to have complete loss of cartilage and   bone-on-bone arthritis with associated osteophytes in the medial and patellofemoral compartments of   the knee with a significant synovitis and associated effusion.  The patient had failed months of conservative treatment including medications, injection therapy, activity modification.     PROCEDURE:  Left total knee replacement.      COMPONENTS USED:  DePuy Attune rotating platform posterior stabilized knee   system, a size 4 femur, 4 tibia, size 7 mm PS AOX insert, and 32 anatomic patellar   button.      SURGEON:  Pietro Cassis. Alvan Dame, M.D.      ASSISTANT:  Costella Hatcher, PA-C.      ANESTHESIA:  Regional and Spinal.      SPECIMENS:  None.      COMPLICATION:  None.      DRAINS:  None.  EBL: <200      TOURNIQUET TIME:   Total Tourniquet Time Documented: Thigh (Left) - 7 minutes Thigh (Left) - 7 minutes Total: Thigh (Left) - 15 minutes  .      The patient was stable to the recovery room.      INDICATION FOR PROCEDURE:  Wanda Lang is a 71 y.o. female patient of   mine.  The patient had been seen, evaluated, and treated for months conservatively in the   office with medication, activity modification, and injections.  The patient had   radiographic changes of bone-on-bone arthritis with endplate sclerosis and osteophytes noted.  Based on the radiographic changes and failed conservative measures, the patient   decided to proceed with definitive treatment, total knee replacement.  Risks of infection, DVT,  component failure, need for revision surgery, neurovascular injury were reviewed in the office setting.  The postop course was reviewed stressing the efforts to maximize post-operative satisfaction and function.  Consent was obtained for benefit of pain   relief.      PROCEDURE IN DETAIL:  The patient was brought to the operative theater.   Once adequate anesthesia, preoperative antibiotics, 2 gm of Ancef,1 gm of Tranexamic Acid, and 10 mg of Decadron administered, the patient was positioned supine with a left thigh tourniquet placed.  The  left lower extremity was prepped and draped in sterile fashion.  A time-   out was performed identifying the patient, planned procedure, and the appropriate extremity.      The left lower extremity was placed in the [facility] leg holder.  The leg was   exsanguinated, tourniquet elevated to 250 mmHg.  A midline  incision was   made followed by median parapatellar arthrotomy.  Following initial   exposure, attention was first directed to the patella.  Precut   measurement was noted to be 20 mm.  I resected down to 13 mm and used a   32 anatomic patellar button to restore patellar height as well as cover the cut surface.      The lug holes were drilled and a metal shim was placed to protect the   patella from retractors and saw blade during the procedure.      At this point, attention was now directed to the femur.  The femoral   canal was opened with a drill, irrigated to try to prevent fat emboli.  An   intramedullary rod was passed at 3 degrees valgus, 9 mm of bone was   resected off the distal femur.  Following this resection, the tibia was   subluxated anteriorly.  Using the extramedullary guide, 2 mm of bone was resected off   the proximal medial tibia.  We confirmed the gap would be   stable medially and laterally with a size 5 spacer block as well as confirmed that the tibial cut was perpendicular in the coronal plane, checking with an alignment rod.       Once this was done, I sized the femur to be a size 4 in the anterior-   posterior dimension, chose a standard component based on medial and   lateral dimension.  The size 4 rotation block was then pinned in   position anterior referenced using the C-clamp to set rotation.  The   anterior, posterior, and  chamfer cuts were made without difficulty nor   notching making certain that I was along the anterior cortex to help   with flexion gap stability.      The final box cut was made off the lateral aspect of distal femur.      At this point, the tibia was sized to be a size 4.  The size 4 tray was   then pinned in position through the medial third of the tubercle,   drilled, and keel punched.  Trial reduction was now carried with a 4 femur,  4 tibia, a size 7 mm PS insert, and the 32 anatomic patella botton.  The knee was brought to full extension with good flexion stability with the patella   tracking through the trochlea without application of pressure.  Given   all these findings the trial components removed.  Final components were   opened and cement was mixed.  The knee was irrigated with normal saline solution and pulse lavage.  The synovial lining was   then injected with 30 cc of 0.25% Marcaine with epinephrine, 1 cc of Toradol and 30 cc of NS for a total of 61 cc.     Final implants were then cemented onto cleaned and dried cut surfaces of bone with the knee brought to extension with a size 7 mm PS trial insert.      Once the cement had fully cured, excess cement was removed   throughout the knee.  I confirmed that I was satisfied with the range of   motion and stability, and the final size 7 mm PS AOX insert was chosen.  It was   placed into the knee.      The tourniquet had been let down at 14 minutes.  No significant   hemostasis was required.  The extensor mechanism was then  reapproximated using #1 Vicryl and #1 Stratafix sutures with the knee   in flexion.  The   remaining  wound was closed with 2-0 Vicryl and running 4-0 Monocryl.   The knee was cleaned, dried, dressed sterilely using Dermabond and   Aquacel dressing.  The patient was then   brought to recovery room in stable condition, tolerating the procedure   well.   Please note that Physician Assistant, Costella Hatcher, PA-C was present for the entirety of the case, and was utilized for pre-operative positioning, peri-operative retractor management, general facilitation of the procedure and for primary wound closure at the end of the case.              Pietro Cassis Alvan Dame, M.D.    07/18/2021 2:05 PM

## 2021-07-18 NOTE — Transfer of Care (Signed)
Immediate Anesthesia Transfer of Care Note  Patient: Wanda Lang  Procedure(s) Performed: TOTAL KNEE ARTHROPLASTY (Left: Knee)  Patient Location: PACU  Anesthesia Type:Spinal  Level of Consciousness: awake  Airway & Oxygen Therapy: Patient Spontanous Breathing  Post-op Assessment: Report given to RN  Post vital signs: stable  Last Vitals:  Vitals Value Taken Time  BP 137/66 07/18/21 1433  Temp    Pulse 76 07/18/21 1434  Resp 13 07/18/21 1434  SpO2 100 % 07/18/21 1434  Vitals shown include unvalidated device data.  Last Pain:  Vitals:   07/18/21 1150  TempSrc:   PainSc: 0-No pain      Patients Stated Pain Goal: 5 (97/58/83 2549)  Complications: No notable events documented.

## 2021-07-18 NOTE — Anesthesia Procedure Notes (Signed)
Anesthesia Regional Block: Adductor canal block   Pre-Anesthetic Checklist: , timeout performed,  Correct Patient, Correct Site, Correct Laterality,  Correct Procedure, Correct Position, site marked,  Risks and benefits discussed,  Surgical consent,  Pre-op evaluation,  At surgeon's request and post-op pain management  Laterality: Left  Prep: chloraprep       Needles:  Injection technique: Single-shot  Needle Type: Echogenic Needle     Needle Length: 9cm  Needle Gauge: 21     Additional Needles:   Procedures:,,,, ultrasound used (permanent image in chart),,    Narrative:  Start time: 07/18/2021 12:13 PM End time: 07/18/2021 12:19 PM Injection made incrementally with aspirations every 5 mL.  Performed by: Personally  Anesthesiologist: Santa Lighter, MD  Additional Notes: No pain on injection. No increased resistance to injection. Injection made in 5cc increments.  Good needle visualization.  Patient tolerated procedure well.

## 2021-07-18 NOTE — Discharge Instructions (Signed)

## 2021-07-18 NOTE — Anesthesia Procedure Notes (Signed)
Procedure Name: Intubation Date/Time: 07/18/2021 12:44 PM  Performed by: Saumya Hukill, Forest Gleason, CRNAPre-anesthesia Checklist: Patient identified, Emergency Drugs available, Suction available, Patient being monitored and Timeout performed Patient Re-evaluated:Patient Re-evaluated prior to induction Oxygen Delivery Method: Circle system utilized Preoxygenation: Pre-oxygenation with 100% oxygen Induction Type: IV induction Ventilation: Mask ventilation without difficulty Laryngoscope Size: 4 Grade View: Grade I Tube type: Oral Tube size: 7.0 mm Number of attempts: 1 Airway Equipment and Method: Stylet Placement Confirmation: ETT inserted through vocal cords under direct vision, positive ETCO2, CO2 detector and breath sounds checked- equal and bilateral Secured at: 20 cm Tube secured with: Tape Dental Injury: Teeth and Oropharynx as per pre-operative assessment

## 2021-07-18 NOTE — Anesthesia Preprocedure Evaluation (Addendum)
Anesthesia Evaluation  Patient identified by MRN, date of birth, ID band Patient awake    Reviewed: Allergy & Precautions, NPO status , Patient's Chart, lab work & pertinent test results  Airway Mallampati: II  TM Distance: >3 FB Neck ROM: Full    Dental  (+) Teeth Intact, Dental Advisory Given   Pulmonary neg pulmonary ROS,    Pulmonary exam normal breath sounds clear to auscultation       Cardiovascular hypertension, + CAD  + Valvular Problems/Murmurs AS  Rhythm:Regular Rate:Normal + Systolic murmurs 2D echocardiogram 07/13/2020 revealed normal left ventricular function with LVEF greater than 55%, severely calcified mitral annulus, without significant stenosis or regurgitation, and mild-moderate aortic stenosis with valve area 1.1 cm, mean gradient 17.1 mmHg and peak gradient 28.6 mmHg   Neuro/Psych negative neurological ROS  negative psych ROS   GI/Hepatic Neg liver ROS, PUD, GERD  ,  Endo/Other  Obesity   Renal/GU Renal InsufficiencyRenal disease     Musculoskeletal  (+) Arthritis ,   Abdominal   Peds  Hematology Plt 167k   Anesthesia Other Findings Day of surgery medications reviewed with the patient.  Reproductive/Obstetrics                            Anesthesia Physical Anesthesia Plan  ASA: 3  Anesthesia Plan: General   Post-op Pain Management: Regional block* and Tylenol PO (pre-op)*   Induction: Intravenous  PONV Risk Score and Plan: 3 and TIVA, Treatment may vary due to age or medical condition, Ondansetron and Dexamethasone  Airway Management Planned: Oral ETT  Additional Equipment:   Intra-op Plan:   Post-operative Plan: Extubation in OR  Informed Consent: I have reviewed the patients History and Physical, chart, labs and discussed the procedure including the risks, benefits and alternatives for the proposed anesthesia with the patient or authorized representative who  has indicated his/her understanding and acceptance.     Dental advisory given  Plan Discussed with: CRNA, Anesthesiologist and Surgeon  Anesthesia Plan Comments:        Anesthesia Quick Evaluation

## 2021-07-18 NOTE — Interval H&P Note (Signed)
History and Physical Interval Note:  07/18/2021 11:36 AM  Wanda Lang  has presented today for surgery, with the diagnosis of Left knee osteoarthritis.  The various methods of treatment have been discussed with the patient and family. After consideration of risks, benefits and other options for treatment, the patient has consented to  Procedure(s): TOTAL KNEE ARTHROPLASTY (Left) as a surgical intervention.  The patient's history has been reviewed, patient examined, no change in status, stable for surgery.  I have reviewed the patient's chart and labs.  Questions were answered to the patient's satisfaction.     Mauri Pole

## 2021-07-19 ENCOUNTER — Encounter (HOSPITAL_COMMUNITY): Payer: Self-pay | Admitting: Orthopedic Surgery

## 2021-07-19 DIAGNOSIS — Z8616 Personal history of COVID-19: Secondary | ICD-10-CM | POA: Diagnosis not present

## 2021-07-19 DIAGNOSIS — M1712 Unilateral primary osteoarthritis, left knee: Secondary | ICD-10-CM | POA: Diagnosis not present

## 2021-07-19 DIAGNOSIS — Z79899 Other long term (current) drug therapy: Secondary | ICD-10-CM | POA: Diagnosis not present

## 2021-07-19 DIAGNOSIS — N189 Chronic kidney disease, unspecified: Secondary | ICD-10-CM | POA: Diagnosis not present

## 2021-07-19 DIAGNOSIS — M25562 Pain in left knee: Secondary | ICD-10-CM | POA: Diagnosis not present

## 2021-07-19 DIAGNOSIS — M069 Rheumatoid arthritis, unspecified: Secondary | ICD-10-CM | POA: Diagnosis not present

## 2021-07-19 DIAGNOSIS — I129 Hypertensive chronic kidney disease with stage 1 through stage 4 chronic kidney disease, or unspecified chronic kidney disease: Secondary | ICD-10-CM | POA: Diagnosis not present

## 2021-07-19 DIAGNOSIS — Z8249 Family history of ischemic heart disease and other diseases of the circulatory system: Secondary | ICD-10-CM | POA: Diagnosis not present

## 2021-07-19 DIAGNOSIS — I2510 Atherosclerotic heart disease of native coronary artery without angina pectoris: Secondary | ICD-10-CM | POA: Diagnosis not present

## 2021-07-19 LAB — CBC
HCT: 35.4 % — ABNORMAL LOW (ref 36.0–46.0)
Hemoglobin: 11.7 g/dL — ABNORMAL LOW (ref 12.0–15.0)
MCH: 31.7 pg (ref 26.0–34.0)
MCHC: 33.1 g/dL (ref 30.0–36.0)
MCV: 95.9 fL (ref 80.0–100.0)
Platelets: 158 10*3/uL (ref 150–400)
RBC: 3.69 MIL/uL — ABNORMAL LOW (ref 3.87–5.11)
RDW: 13 % (ref 11.5–15.5)
WBC: 7.5 10*3/uL (ref 4.0–10.5)
nRBC: 0 % (ref 0.0–0.2)

## 2021-07-19 LAB — BASIC METABOLIC PANEL
Anion gap: 6 (ref 5–15)
BUN: 14 mg/dL (ref 8–23)
CO2: 27 mmol/L (ref 22–32)
Calcium: 8.1 mg/dL — ABNORMAL LOW (ref 8.9–10.3)
Chloride: 106 mmol/L (ref 98–111)
Creatinine, Ser: 0.68 mg/dL (ref 0.44–1.00)
GFR, Estimated: >60 mL/min (ref >60)
Glucose, Bld: 137 mg/dL — ABNORMAL HIGH (ref 70–99)
Potassium: 4.2 mmol/L (ref 3.5–5.1)
Sodium: 139 mmol/L (ref 135–145)

## 2021-07-19 MED ORDER — PREDNISONE 5 MG (21) PO TBPK: 5.0000 mg | ORAL_TABLET | Freq: Four times a day (QID) | ORAL | Status: DC

## 2021-07-19 MED ORDER — PREDNISONE 5 MG (21) PO TBPK
10.0000 mg | ORAL_TABLET | Freq: Every evening | ORAL | Status: AC
  Administered 2021-07-19: 10 mg via ORAL

## 2021-07-19 MED ORDER — PREDNISONE 5 MG (21) PO TBPK
5.0000 mg | ORAL_TABLET | Freq: Three times a day (TID) | ORAL | Status: DC
  Administered 2021-07-20: 5 mg via ORAL

## 2021-07-19 MED ORDER — PREDNISONE 5 MG (21) PO TBPK
5.0000 mg | ORAL_TABLET | ORAL | Status: AC
  Administered 2021-07-19: 5 mg via ORAL

## 2021-07-19 MED ORDER — PREDNISONE 5 MG (21) PO TBPK: 10.0000 mg | ORAL_TABLET | Freq: Every evening | ORAL | Status: DC

## 2021-07-19 MED ORDER — PREDNISONE 5 MG (21) PO TBPK
10.0000 mg | ORAL_TABLET | Freq: Every morning | ORAL | Status: AC
  Administered 2021-07-19: 10 mg via ORAL
  Filled 2021-07-19: qty 21

## 2021-07-19 NOTE — TOC Transition Note (Signed)
Transition of Care [facility]) - CM/SW Discharge Note  Patient Details  Name: Wanda Lang MRN: [id number] Date of Birth: [birth year]  [facility] CM/SW Contact:  Sherie Don, LCSW Phone Number: 07/19/2021, 9:52 AM  Clinical Narrative: Patient is expected to discharge home after working with PT. CSW met with patient to confirm discharge plan. Patient was expected to discharge home with OPPT at [facility] in [place], but wants to switch to the [place] location due to limited [facility] appointments in [place]. CSW encouraged patient to call [facility] today to see if the location can be changed. Patient has a rolling walker and elevated toilets at home, so there are no DME needs at this time. TOC signing off.  Final next level of care: OP Rehab Barriers to Discharge: No Barriers Identified  Patient Goals and CMS Choice Patient states their goals for this hospitalization and ongoing recovery are:: Discharge home with OPPT at [facility] Choice offered to / list presented to : NA  Discharge Plan and Services        DME Arranged: N/A DME Agency: NA  Readmission Risk Interventions     No data to display

## 2021-07-19 NOTE — Progress Notes (Signed)
   Subjective: 1 Day Post-Op Procedure(s) (LRB): TOTAL KNEE ARTHROPLASTY (Left) Patient reports pain as mild.   Patient seen in rounds by Dr. Alvan Dame. Patient is resting in bed on exam. She is still having difficulty with back/leg pain. No acute events overnight. Foley catheter removed. Patient did not get up with PT yet.  We will start therapy today.   Objective: Vital signs in last 24 hours: Temp:  [97.4 F (36.3 C)-98.7 F (37.1 C)] 97.5 F (36.4 C) (06/14 0609) Pulse Rate:  [58-104] 94 (06/14 0609) Resp:  [8-18] 18 (06/14 0609) BP: (105-156)/(56-78) 105/78 (06/14 0609) SpO2:  [95 %-100 %] 100 % (06/14 0609) Weight:  [100.2 kg] 100.2 kg (06/13 1150)  Intake/Output from previous day:  Intake/Output Summary (Last 24 hours) at 07/19/2021 0826 Last data filed at 07/19/2021 0600 Gross per 24 hour  Intake 2893.85 ml  Output 350 ml  Net 2543.85 ml     Intake/Output this shift: No intake/output data recorded.  Labs: Recent Labs    07/19/21 0346  HGB 11.7*   Recent Labs    07/19/21 0346  WBC 7.5  RBC 3.69*  HCT 35.4*  PLT 158   Recent Labs    07/19/21 0346  NA 139  K 4.2  CL 106  CO2 27  BUN 14  CREATININE 0.68  GLUCOSE 137*  CALCIUM 8.1*   No results for input(s): "LABPT", "INR" in the last 72 hours.  Exam: General - Patient is Alert and Oriented Extremity - Neurologically intact Sensation intact distally Intact pulses distally Dorsiflexion/Plantar flexion intact Dressing - dressing C/D/I Motor Function - intact, moving foot and toes well on exam.   Past Medical History:  Diagnosis Date   Allergy    Arthritis    Chronic kidney disease    Coronary artery disease    GERD (gastroesophageal reflux disease)    Heart murmur    Hypertension    LIVER FUNCTION TESTS, ABNORMAL, HX OF 09/08/2007   Qualifier: Diagnosis of  By: Nelson-Smith CMA (AAMA), Dottie      Assessment/Plan: 1 Day Post-Op Procedure(s) (LRB): TOTAL KNEE ARTHROPLASTY  (Left) Principal Problem:   S/P total knee arthroplasty, left  Estimated body mass index is 36.22 kg/m as calculated from the following:   Height as of this encounter: 5' 5.5" (1.664 m).   Weight as of this encounter: 100.2 kg. Advance diet Up with therapy  Anticipated LOS equal to or greater than 2 midnights due to - Age 10 and older with one or more of the following:  - Obesity  - Expected need for hospital services (PT, OT, Nursing) required for safe  discharge  - Anticipated need for postoperative skilled nursing care or inpatient rehab  - Active co-morbidities: None OR   - Unanticipated findings during/Post Surgery: Slow post-op progression: GI, pain control, mobility  - Patient is a high risk of re-admission due to: None   DVT Prophylaxis - Aspirin Weight bearing as tolerated.  Hgb stable at 11.7 this AM.  Plan is to go Home after hospital stay.   Will stat prednisone dose pack today to help with her back/radicular pain. Gave 4 doses toradol yesterday. Unfortunately has had flare over the past week or two which is contributing to her pain.  Will require overnight stay tonight to continue addressing pain as well as improved mobility for safe discharge home.  Griffith Citron, PA-C Orthopedic Surgery [phone] 07/19/2021, 8:26 AM

## 2021-07-19 NOTE — Evaluation (Signed)
Physical Therapy Evaluation Patient Details Name: Wanda Lang MRN: [id number] DOB: 1950-10-05 Today's Date: 07/19/2021  History of Present Illness  71 yo female s/p L TKA 6/13. Hx of back pain, RA  Clinical Impression  On eval, pt was Min guard assist for mobility. She walked ~100 feet with a RW. Moderate pain in L knee, back/L LE, shoulders during session. Anticipate pt will progress well. Plan is for d/c home on tomorrow        Recommendations for follow up therapy are one component of a multi-disciplinary discharge planning process, led by the attending physician.  Recommendations may be updated based on patient status, additional functional criteria and insurance authorization.  Follow Up Recommendations Follow physician's recommendations for discharge plan and follow up therapies    Assistance Recommended at Discharge PRN  Patient can return home with the following  A little help with walking and/or transfers;A little help with bathing/dressing/bathroom;Assist for transportation;Assistance with cooking/housework;Help with stairs or ramp for entrance    Equipment Recommendations None recommended by PT  Recommendations for Other Services       Functional Status Assessment Patient has had a recent decline in their functional status and demonstrates the ability to make significant improvements in function in a reasonable and predictable amount of time.     Precautions / Restrictions Precautions Precautions: Fall;Knee Restrictions Weight Bearing Restrictions: No Other Position/Activity Restrictions: WBAT      Mobility  Bed Mobility Overal bed mobility: Needs Assistance Bed Mobility: Supine to Sit     Supine to sit: Supervision          Transfers Overall transfer level: Needs assistance Equipment used: Rolling walker (2 wheels) Transfers: Sit to/from Stand Sit to Stand: Supervision           General transfer comment: Supv for safety. Cues hand placement.     Ambulation/Gait Ambulation/Gait assistance: Min guard Gait Distance (Feet): 100 Feet Assistive device: Rolling walker (2 wheels) Gait Pattern/deviations: Step-through pattern, Decreased stride length       General Gait Details: Min guard for safety. No dizziness.  Stairs            Wheelchair Mobility    Modified Rankin (Stroke Patients Only)       Balance Overall balance assessment: Mild deficits observed, not formally tested                                           Pertinent Vitals/Pain Pain Assessment Pain Assessment: 0-10 Pain Score: 6  Pain Location: back, L knee, shoulders Pain Descriptors / Indicators: Discomfort, Aching, Sore Pain Intervention(s): Limited activity within patient's tolerance, Monitored during session, Repositioned, Ice applied    Home Living Family/patient expects to be discharged to:: Private residence Living Arrangements: Alone   Type of Home: House Home Access: Stairs to enter Entrance Stairs-Rails: Left Entrance Stairs-Number of Steps: 5-6 Alternate Level Stairs-Number of Steps: 1 flight Home Layout: Able to live on main level with bedroom/bathroom;Two level Home Equipment: Conservation officer, nature (2 wheels);Cane - single point Additional Comments: walking stick    Prior Function Prior Level of Function : Independent/Modified Independent                     Hand Dominance        Extremity/Trunk Assessment   Upper Extremity Assessment Upper Extremity Assessment: Overall WFL for tasks assessed    Lower  Extremity Assessment Lower Extremity Assessment: Generalized weakness    Cervical / Trunk Assessment Cervical / Trunk Assessment: Normal  Communication   Communication: No difficulties  Cognition Arousal/Alertness: Awake/alert Behavior During Therapy: WFL for tasks assessed/performed Overall Cognitive Status: Within Functional Limits for tasks assessed                                           General Comments      Exercises Total Joint Exercises Ankle Circles/Pumps: AROM, Both, 10 reps Quad Sets: AROM, Both, 10 reps Hip ABduction/ADduction: AROM, Left, 10 reps Straight Leg Raises: AROM, Left, 10 reps Knee Flexion: AROM, Left, 10 reps Goniometric ROM: ~5-95 degrees   Assessment/Plan    PT Assessment Patient needs continued PT services  PT Problem List Decreased strength;Decreased mobility;Decreased range of motion;Decreased activity tolerance;Decreased balance;Decreased knowledge of use of DME;Pain       PT Treatment Interventions DME instruction;Gait training;Therapeutic activities;Therapeutic exercise;Patient/family education;Stair training;Balance training;Functional mobility training    PT Goals (Current goals can be found in the Care Plan section)  Acute Rehab PT Goals Patient Stated Goal: regain PLOF/independence PT Goal Formulation: With patient Time For Goal Achievement: 08/02/21 Potential to Achieve Goals: Good    Frequency 7X/week     Co-evaluation               AM-PAC PT "6 Clicks" Mobility  Outcome Measure Help needed turning from your back to your side while in a flat bed without using bedrails?: None Help needed moving from lying on your back to sitting on the side of a flat bed without using bedrails?: None Help needed moving to and from a bed to a chair (including a wheelchair)?: A Little Help needed standing up from a chair using your arms (e.g., wheelchair or bedside chair)?: A Little Help needed to walk in hospital room?: A Little Help needed climbing 3-5 steps with a railing? : A Little 6 Click Score: 20    End of Session Equipment Utilized During Treatment: Gait belt Activity Tolerance: Patient tolerated treatment well Patient left: in chair;with call bell/phone within reach   PT Visit Diagnosis: Other abnormalities of gait and mobility (R26.89);Pain Pain - Right/Left: Left Pain - part of body: Knee;Leg (back)     Time: 1660-6004 PT Time Calculation (min) (ACUTE ONLY): 38 min   Charges:   PT Evaluation $PT Eval Low Complexity: 1 Low PT Treatments $Gait Training: 8-22 mins $Therapeutic Exercise: 8-22 mins           Doreatha Massed, PT Acute Rehabilitation  Office: [phone] Pager: [phone]

## 2021-07-19 NOTE — Progress Notes (Signed)
Physical Therapy Treatment Patient Details Name: Wanda Lang MRN: [id number] DOB: Jul 16, 1950 Today's Date: 07/19/2021   History of Present Illness 71 yo female s/p L TKA 6/13. Hx of back pain, RA    PT Comments    Pt continues to participate well. Moderate pain. Plan is for d/c home on tomorrow.    Recommendations for follow up therapy are one component of a multi-disciplinary discharge planning process, led by the attending physician.  Recommendations may be updated based on patient status, additional functional criteria and insurance authorization.  Follow Up Recommendations  Follow physician's recommendations for discharge plan and follow up therapies     Assistance Recommended at Discharge PRN  Patient can return home with the following A little help with walking and/or transfers;A little help with bathing/dressing/bathroom;Assist for transportation;Assistance with cooking/housework;Help with stairs or ramp for entrance   Equipment Recommendations  None recommended by PT    Recommendations for Other Services       Precautions / Restrictions Precautions Precautions: Fall;Knee Restrictions Weight Bearing Restrictions: No Other Position/Activity Restrictions: WBAT     Mobility  Bed Mobility Overal bed mobility: Needs Assistance Bed Mobility: Supine to Sit     Supine to sit: Supervision     General bed mobility comments: oob in recliner    Transfers Overall transfer level: Needs assistance Equipment used: Rolling walker (2 wheels) Transfers: Sit to/from Stand Sit to Stand: Supervision           General transfer comment: Supv for safety. Cues hand placement.    Ambulation/Gait Ambulation/Gait assistance: Min guard Gait Distance (Feet): 100 Feet Assistive device: Rolling walker (2 wheels) Gait Pattern/deviations: Step-through pattern, Decreased stride length       General Gait Details: Min guard for safety. 1 instance of unsteadiness with head  turn.   Stairs             Wheelchair Mobility    Modified Rankin (Stroke Patients Only)       Balance Overall balance assessment: Needs assistance         Standing balance support: During functional activity, Reliant on assistive device for balance Standing balance-Leahy Scale: Fair                              Cognition Arousal/Alertness: Awake/alert Behavior During Therapy: WFL for tasks assessed/performed Overall Cognitive Status: Within Functional Limits for tasks assessed                                          Exercises Total Joint Exercises Ankle Circles/Pumps: AROM, Both, 10 reps Quad Sets: AROM, Both, 10 reps Hip ABduction/ADduction: AROM, Left, 10 reps Straight Leg Raises: AROM, Left, 10 reps Knee Flexion: AROM, Left, 10 reps Goniometric ROM: ~5-95 degrees    General Comments        Pertinent Vitals/Pain Pain Assessment Pain Assessment: 0-10 Pain Score: 6  Pain Location: back, L knee, shoulders Pain Descriptors / Indicators: Discomfort, Aching, Sore Pain Intervention(s): Limited activity within patient's tolerance, Monitored during session, Ice applied    Home Living                          Prior Function            PT Goals (current goals can now be found in the care  plan section) Progress towards PT goals: Progressing toward goals    Frequency    7X/week      PT Plan Current plan remains appropriate    Co-evaluation              AM-PAC PT "6 Clicks" Mobility   Outcome Measure  Help needed turning from your back to your side while in a flat bed without using bedrails?: None Help needed moving from lying on your back to sitting on the side of a flat bed without using bedrails?: None Help needed moving to and from a bed to a chair (including a wheelchair)?: A Little Help needed standing up from a chair using your arms (e.g., wheelchair or bedside chair)?: A Little Help needed  to walk in hospital room?: A Little Help needed climbing 3-5 steps with a railing? : A Little 6 Click Score: 20    End of Session Equipment Utilized During Treatment: Gait belt Activity Tolerance: Patient tolerated treatment well Patient left: in chair;with call bell/phone within reach   PT Visit Diagnosis: Other abnormalities of gait and mobility (R26.89);Pain Pain - Right/Left: Left Pain - part of body: Knee;Leg (back)     Time: 1351-1405 PT Time Calculation (min) (ACUTE ONLY): 14 min  Charges:  $Gait Training: 8-22 mins                         Doreatha Massed, PT Acute Rehabilitation  Office: [phone] Pager: [phone]

## 2021-07-20 DIAGNOSIS — N189 Chronic kidney disease, unspecified: Secondary | ICD-10-CM | POA: Diagnosis not present

## 2021-07-20 DIAGNOSIS — Z8616 Personal history of COVID-19: Secondary | ICD-10-CM | POA: Diagnosis not present

## 2021-07-20 DIAGNOSIS — I129 Hypertensive chronic kidney disease with stage 1 through stage 4 chronic kidney disease, or unspecified chronic kidney disease: Secondary | ICD-10-CM | POA: Diagnosis not present

## 2021-07-20 DIAGNOSIS — I2510 Atherosclerotic heart disease of native coronary artery without angina pectoris: Secondary | ICD-10-CM | POA: Diagnosis not present

## 2021-07-20 DIAGNOSIS — M25562 Pain in left knee: Secondary | ICD-10-CM | POA: Diagnosis not present

## 2021-07-20 DIAGNOSIS — Z79899 Other long term (current) drug therapy: Secondary | ICD-10-CM | POA: Diagnosis not present

## 2021-07-20 DIAGNOSIS — Z8249 Family history of ischemic heart disease and other diseases of the circulatory system: Secondary | ICD-10-CM | POA: Diagnosis not present

## 2021-07-20 DIAGNOSIS — M1712 Unilateral primary osteoarthritis, left knee: Secondary | ICD-10-CM | POA: Diagnosis not present

## 2021-07-20 DIAGNOSIS — M069 Rheumatoid arthritis, unspecified: Secondary | ICD-10-CM | POA: Diagnosis not present

## 2021-07-20 LAB — CBC
HCT: 37.4 % (ref 36.0–46.0)
Hemoglobin: 12.4 g/dL (ref 12.0–15.0)
MCH: 32.4 pg (ref 26.0–34.0)
MCHC: 33.2 g/dL (ref 30.0–36.0)
MCV: 97.7 fL (ref 80.0–100.0)
Platelets: 156 10*3/uL (ref 150–400)
RBC: 3.83 MIL/uL — ABNORMAL LOW (ref 3.87–5.11)
RDW: 13.3 % (ref 11.5–15.5)
WBC: 9.6 10*3/uL (ref 4.0–10.5)
nRBC: 0 % (ref 0.0–0.2)

## 2021-07-20 MED ORDER — CELECOXIB 200 MG PO CAPS: 200.0000 mg | ORAL_CAPSULE | Freq: Two times a day (BID) | ORAL | 0 refills | Status: DC

## 2021-07-20 MED ORDER — METHOCARBAMOL 500 MG PO TABS: 500.0000 mg | ORAL_TABLET | Freq: Four times a day (QID) | ORAL | 0 refills | Status: DC | PRN

## 2021-07-20 MED ORDER — OXYCODONE HCL 5 MG PO TABS: 5.0000 mg | ORAL_TABLET | ORAL | 0 refills | Status: DC | PRN

## 2021-07-20 MED ORDER — DOCUSATE SODIUM 100 MG PO CAPS: 100.0000 mg | ORAL_CAPSULE | Freq: Two times a day (BID) | ORAL | 0 refills | Status: DC

## 2021-07-20 MED ORDER — ACETAMINOPHEN 325 MG PO TABS: 1000.0000 mg | ORAL_TABLET | Freq: Four times a day (QID) | ORAL | Status: DC

## 2021-07-20 MED ORDER — ASPIRIN 81 MG PO CHEW: 81.0000 mg | CHEWABLE_TABLET | Freq: Two times a day (BID) | ORAL | 0 refills | Status: AC

## 2021-07-20 MED ORDER — ORAL CARE MOUTH RINSE
15.0000 mL | OROMUCOSAL | Status: DC | PRN
Start: 2021-07-20 — End: 2021-07-20

## 2021-07-20 MED ORDER — POLYETHYLENE GLYCOL 3350 17 G PO PACK: 17.0000 g | PACK | Freq: Every day | ORAL | 0 refills | Status: DC | PRN

## 2021-07-20 NOTE — Progress Notes (Signed)
Physical Therapy Treatment Patient Details Name: Wanda Lang MRN: [id number] DOB: Nov 13, 1950 Today's Date: 07/20/2021   History of Present Illness 71 yo female s/p L TKA 6/13. Hx of back pain, RA    PT Comments    Pt is progressing well with therapy. She ambulated 90 ft down hallway safely, completed stairs and HEP. Currently pt requires Min guard- Min assist for stairs and gait, Min guard for transfers, and Min assist for bed mobility. Pt will benefit from continuing skilled PT. Pt is mobilizing at safe level for DC home with assist from friends. Recommending follow up PT per surgeon.   Recommendations for follow up therapy are one component of a multi-disciplinary discharge planning process, led by the attending physician.  Recommendations may be updated based on patient status, additional functional criteria and insurance authorization.  Follow Up Recommendations  Follow physician's recommendations for discharge plan and follow up therapies     Assistance Recommended at Discharge PRN  Patient can return home with the following A little help with walking and/or transfers;A little help with bathing/dressing/bathroom;Assist for transportation;Assistance with cooking/housework;Help with stairs or ramp for entrance   Equipment Recommendations  None recommended by PT    Recommendations for Other Services       Precautions / Restrictions Precautions Precautions: Fall;Knee Restrictions Weight Bearing Restrictions: No Other Position/Activity Restrictions: WBAT     Mobility  Bed Mobility Overal bed mobility: Needs Assistance Bed Mobility: Supine to Sit     Supine to sit: Min assist, HOB elevated     General bed mobility comments: Assist for Lt LE to move to EOB due to pain    Transfers Overall transfer level: Needs assistance Equipment used: Rolling walker (2 wheels) Transfers: Sit to/from Stand Sit to Stand: Min guard           General transfer comment: guarding  for safety, pt using bil UE for power up from EOB.    Ambulation/Gait Ambulation/Gait assistance: Min guard, Min assist Gait Distance (Feet): 90 Feet Assistive device: Rolling walker (2 wheels) Gait Pattern/deviations: Step-to pattern, Decreased stride length, Decreased weight shift to left Gait velocity: decr     General Gait Details: Pt demonstrated safe ambulation down hallway needing VC's for proper step to pattern when nonoperative foot exceeded operative foot.   Stairs Stairs: Yes Stairs assistance: Min assist, Min guard Stair Management: One rail Left, Step to pattern, Sideways, With walker, Backwards Number of Stairs: 4 (2x2) General stair comments: Pt required repeated VC's for hand placement on hand rail for support during stairs. Pt struggled with sufficient knee flexion for full foot contact on next step. Pt was educated on 2 options for going up stairs, one with proper use of RW ascending backwards.   Wheelchair Mobility    Modified Rankin (Stroke Patients Only)       Balance                                            Cognition Arousal/Alertness: Awake/alert Behavior During Therapy: WFL for tasks assessed/performed Overall Cognitive Status: Within Functional Limits for tasks assessed                                          Exercises Total Joint Exercises Ankle Circles/Pumps: AROM, Both, 20 reps  Quad Sets: AROM, Left, 10 reps Heel Slides: AAROM, Left, 5 reps Hip ABduction/ADduction: AAROM, Left, 5 reps Long Arc Quad: AROM, Left, 5 reps    General Comments        Pertinent Vitals/Pain Pain Assessment Pain Assessment: 0-10 Pain Score: 6  Pain Location: back, L knee, shoulders Pain Descriptors / Indicators: Discomfort, Aching, Sore Pain Intervention(s): Limited activity within patient's tolerance, Monitored during session, Repositioned    Home Living                          Prior Function             PT Goals (current goals can now be found in the care plan section) Acute Rehab PT Goals Patient Stated Goal: regain PLOF/independence PT Goal Formulation: With patient Time For Goal Achievement: 08/02/21 Potential to Achieve Goals: Good Progress towards PT goals: Progressing toward goals    Frequency    7X/week      PT Plan Current plan remains appropriate    Co-evaluation              AM-PAC PT "6 Clicks" Mobility   Outcome Measure  Help needed turning from your back to your side while in a flat bed without using bedrails?: A Little Help needed moving from lying on your back to sitting on the side of a flat bed without using bedrails?: A Little Help needed moving to and from a bed to a chair (including a wheelchair)?: A Little Help needed standing up from a chair using your arms (e.g., wheelchair or bedside chair)?: A Little Help needed to walk in hospital room?: A Little Help needed climbing 3-5 steps with a railing? : A Little 6 Click Score: 18    End of Session Equipment Utilized During Treatment: Gait belt Activity Tolerance: Patient tolerated treatment well Patient left: in chair;with call bell/phone within reach;with chair alarm set Nurse Communication: Mobility status PT Visit Diagnosis: Other abnormalities of gait and mobility (R26.89);Pain Pain - Right/Left: Left Pain - part of body: Knee;Leg     Time: 1040-1106 PT Time Calculation (min) (ACUTE ONLY): 26 min  Charges:  $Gait Training: 8-22 mins $Therapeutic Exercise: 8-22 mins                    Margie Ege, SPT [place] 07/20/2021, 1:06 PM

## 2021-07-20 NOTE — Progress Notes (Signed)
Patient discharged to home w/ friend. Verbalized understanding of all instructions. Escorted to pov via w/c.

## 2021-07-20 NOTE — Progress Notes (Signed)
   Subjective: 2 Days Post-Op Procedure(s) (LRB): TOTAL KNEE ARTHROPLASTY (Left) Patient reports pain as mild.   Patient seen in rounds by Dr. Alvan Dame. Patient is resting in bed on exam. No acute events overnight. She does continue to have pain related to her back. She has seen Dr. Tonita Cong in the past for evaluation of this. Ambulated 100 feet with PT yesterday.  We will continue therapy today.   Objective: Vital signs in last 24 hours: Temp:  [98 F (36.7 C)-99.3 F (37.4 C)] 98.3 F (36.8 C) (06/15 0528) Pulse Rate:  [65-95] 89 (06/15 0546) Resp:  [14-18] 16 (06/15 0546) BP: (109-181)/(51-98) 156/86 (06/15 0546) SpO2:  [96 %-100 %] 98 % (06/15 0546)  Intake/Output from previous day:  Intake/Output Summary (Last 24 hours) at 07/20/2021 0840 Last data filed at 07/20/2021 0200 Gross per 24 hour  Intake 745.6 ml  Output --  Net 745.6 ml     Intake/Output this shift: No intake/output data recorded.  Labs: Recent Labs    07/19/21 0346 07/20/21 0353  HGB 11.7* 12.4   Recent Labs    07/19/21 0346 07/20/21 0353  WBC 7.5 9.6  RBC 3.69* 3.83*  HCT 35.4* 37.4  PLT 158 156   Recent Labs    07/19/21 0346  NA 139  K 4.2  CL 106  CO2 27  BUN 14  CREATININE 0.68  GLUCOSE 137*  CALCIUM 8.1*   No results for input(s): "LABPT", "INR" in the last 72 hours.  Exam: General - Patient is Alert and Oriented Extremity - Neurologically intact Sensation intact distally Intact pulses distally Dorsiflexion/Plantar flexion intact Dressing - dressing C/D/I Motor Function - intact, moving foot and toes well on exam.   Past Medical History:  Diagnosis Date   Allergy    Arthritis    Chronic kidney disease    Coronary artery disease    GERD (gastroesophageal reflux disease)    Heart murmur    Hypertension    LIVER FUNCTION TESTS, ABNORMAL, HX OF 09/08/2007   Qualifier: Diagnosis of  By: Nelson-Smith CMA (AAMA), Dottie      Assessment/Plan: 2 Days Post-Op Procedure(s)  (LRB): TOTAL KNEE ARTHROPLASTY (Left) Principal Problem:   S/P total knee arthroplasty, left  Estimated body mass index is 36.22 kg/m as calculated from the following:   Height as of this encounter: 5' 5.5" (1.664 m).   Weight as of this encounter: 100.2 kg. Advance diet Up with therapy D/C IV fluids    DVT Prophylaxis - Aspirin Weight bearing as tolerated.  Plan is to go Home after hospital stay. Plan for discharge today following 1-2 sessions of PT as long as they are meeting their goals. Patient is scheduled for OPPT. Follow up in the office in 2 weeks.   We will send her with a prednisone dose pack, which I will send from office EMR. Will keep close follow up of her back symptoms and refer her for [facility] if needed.   Griffith Citron, PA-C Orthopedic Surgery [phone] 07/20/2021, 8:40 AM

## 2021-07-21 DIAGNOSIS — M25562 Pain in left knee: Secondary | ICD-10-CM | POA: Diagnosis not present

## 2021-07-21 DIAGNOSIS — M25662 Stiffness of left knee, not elsewhere classified: Secondary | ICD-10-CM | POA: Diagnosis not present

## 2021-07-24 DIAGNOSIS — M5136 Other intervertebral disc degeneration, lumbar region: Secondary | ICD-10-CM | POA: Insufficient documentation

## 2021-07-24 DIAGNOSIS — M25562 Pain in left knee: Secondary | ICD-10-CM | POA: Diagnosis not present

## 2021-07-24 DIAGNOSIS — M51369 Other intervertebral disc degeneration, lumbar region without mention of lumbar back pain or lower extremity pain: Secondary | ICD-10-CM | POA: Insufficient documentation

## 2021-07-26 DIAGNOSIS — M25562 Pain in left knee: Secondary | ICD-10-CM | POA: Diagnosis not present

## 2021-07-27 NOTE — Discharge Summary (Cosign Needed)
Physician Discharge Summary   Patient ID: Wanda Lang MRN: [id number] DOB/AGE: January 04, 1951 71 y.o.  Admit date: 07/18/2021 Discharge date: 07/20/2021  Primary Diagnosis: Left knee osteoarthritis.   Admission Diagnoses:  Past Medical History:  Diagnosis Date   Allergy    Arthritis    Chronic kidney disease    Coronary artery disease    GERD (gastroesophageal reflux disease)    Heart murmur    Hypertension    LIVER FUNCTION TESTS, ABNORMAL, HX OF 09/08/2007   Qualifier: Diagnosis of  By: Nelson-Smith CMA (AAMA), Dottie     Discharge Diagnoses:   Principal Problem:   S/P total knee arthroplasty, left  Estimated body mass index is 36.22 kg/m as calculated from the following:   Height as of this encounter: 5' 5.5" (1.664 m).   Weight as of this encounter: 100.2 kg.  Procedure:  Procedure(s) (LRB): TOTAL KNEE ARTHROPLASTY (Left)   Consults: None  HPI:  Wanda Lang is a 71 y.o. female patient of   mine.  The patient had been seen, evaluated, and treated for months conservatively in the   office with medication, activity modification, and injections.  The patient had   radiographic changes of bone-on-bone arthritis with endplate sclerosis and osteophytes noted.  Based on the radiographic changes and failed conservative measures, the patient   decided to proceed with definitive treatment, total knee replacement.  Risks of infection, DVT, component failure, need for revision surgery, neurovascular injury were reviewed in the office setting.  The postop course was reviewed stressing the efforts to maximize post-operative satisfaction and function.  Consent was obtained for benefit of pain   relief.   Laboratory Data: Admission on 07/18/2021, Discharged on 07/20/2021  Component Date Value Ref Range Status   ABO/RH(D) 07/18/2021    Final                   Value:O POS Performed at [facility] [address], [place], [place] [address]    WBC 07/19/2021  7.5  4.0 - 10.5 K/uL Final   RBC 07/19/2021 3.69 (L)  3.87 - 5.11 MIL/uL Final   Hemoglobin 07/19/2021 11.7 (L)  12.0 - 15.0 g/dL Final   HCT 07/19/2021 35.4 (L)  36.0 - 46.0 % Final   MCV 07/19/2021 95.9  80.0 - 100.0 fL Final   MCH 07/19/2021 31.7  26.0 - 34.0 pg Final   MCHC 07/19/2021 33.1  30.0 - 36.0 g/dL Final   RDW 07/19/2021 13.0  11.5 - 15.5 % Final   Platelets 07/19/2021 158  150 - 400 K/uL Final   nRBC 07/19/2021 0.0  0.0 - 0.2 % Final   Performed at [facility], [place] [address] [place], [place] [address]   Sodium 07/19/2021 139  135 - 145 mmol/L Final   Potassium 07/19/2021 4.2  3.5 - 5.1 mmol/L Final   Chloride 07/19/2021 106  98 - 111 mmol/L Final   CO2 07/19/2021 27  22 - 32 mmol/L Final   Glucose, Bld 07/19/2021 137 (H)  70 - 99 mg/dL Final   Glucose reference range applies only to samples taken after fasting for at least 8 hours.   BUN 07/19/2021 14  8 - 23 mg/dL Final   Creatinine, Ser 07/19/2021 0.68  0.44 - 1.00 mg/dL Final   Calcium 07/19/2021 8.1 (L)  8.9 - 10.3 mg/dL Final   GFR, Estimated 07/19/2021 >60  >60 mL/min Final   Comment: (NOTE) Calculated using the CKD-EPI Creatinine Equation (  2021)    Anion gap 07/19/2021 6  5 - 15 Final   Performed at [facility], [place] [address] [place], [place] [address]   WBC 07/20/2021 9.6  4.0 - 10.5 K/uL Final   RBC 07/20/2021 3.83 (L)  3.87 - 5.11 MIL/uL Final   Hemoglobin 07/20/2021 12.4  12.0 - 15.0 g/dL Final   HCT 07/20/2021 37.4  36.0 - 46.0 % Final   MCV 07/20/2021 97.7  80.0 - 100.0 fL Final   MCH 07/20/2021 32.4  26.0 - 34.0 pg Final   MCHC 07/20/2021 33.2  30.0 - 36.0 g/dL Final   RDW 07/20/2021 13.3  11.5 - 15.5 % Final   Platelets 07/20/2021 156  150 - 400 K/uL Final   nRBC 07/20/2021 0.0  0.0 - 0.2 % Final   Performed at [facility], [place] [address], [place], [place] [address]  Office Visit on 07/07/2021  Component Date Value Ref Range Status    Hgb A1c MFr Bld 07/07/2021 5.6  4.6 - 6.5 % Final   Glycemic Control Guidelines for People with Diabetes:Non Diabetic:  <6%Goal of Therapy: <7%Additional Action Suggested:  >8%    Color, UA 07/07/2021 dark yellow   Final   Clarity, UA 07/07/2021 clear   Final   Glucose, UA 07/07/2021 Negative  Negative Final   Bilirubin, UA 07/07/2021 negative   Final   Ketones, UA 07/07/2021 negative   Final   Spec Grav, UA 07/07/2021 1.025  1.010 - 1.025 Final   Blood, UA 07/07/2021 negative   Final   pH, UA 07/07/2021 5.5  5.0 - 8.0 Final   Protein, UA 07/07/2021 Positive (A)  Negative Final   Urobilinogen, UA 07/07/2021 0.2  0.2 or 1.0 E.U./dL Final   Nitrite, UA 07/07/2021 negative   Final   Leukocytes, UA 07/07/2021 Negative  Negative Final   Appearance 07/07/2021 clea   Final   Odor 07/07/2021 none   Final   INR 07/07/2021 1.0  0.8 - 1.0 ratio Final   Prothrombin Time 07/07/2021 11.2  9.6 - 13.1 sec Final   Creatinine, Urine 07/07/2021 158  20 - 275 mg/dL Final   Protein/Creat Ratio 07/07/2021 63  24 - 184 mg/g creat Final   Protein/Creatinine Ratio 07/07/2021 0.063  0.024 - 0.184 mg/mg creat Final   Total Protein, Urine 07/07/2021 10  5 - 24 mg/dL Final  Hospital Outpatient Visit on 07/06/2021  Component Date Value Ref Range Status   MRSA, PCR 07/06/2021 NEGATIVE  NEGATIVE Final   Staphylococcus aureus 07/06/2021 POSITIVE (A)  NEGATIVE Final   Comment: (NOTE) The Xpert SA Assay (FDA approved for NASAL specimens in patients 69 years of age and older), is one component of a comprehensive surveillance program. It is not intended to diagnose infection nor to guide or monitor treatment. Performed at [facility], [place] [address], [place], [place] [address]    ABO/RH(D) 07/06/2021 O POS   Final   Antibody Screen 07/06/2021 NEG   Final   Sample Expiration 07/06/2021 07/20/2021,2359   Final   Extend sample reason 07/06/2021    Final                   Value:NO TRANSFUSIONS OR  PREGNANCY IN THE PAST 3 MONTHS Performed at [facility], [place] [address], [place], [place] [address]    Sodium 07/06/2021 140  135 - 145 mmol/L Final   Potassium 07/06/2021 4.4  3.5 - 5.1 mmol/L Final  Chloride 07/06/2021 107  98 - 111 mmol/L Final   CO2 07/06/2021 27  22 - 32 mmol/L Final   Glucose, Bld 07/06/2021 104 (H)  70 - 99 mg/dL Final   Glucose reference range applies only to samples taken after fasting for at least 8 hours.   BUN 07/06/2021 19  8 - 23 mg/dL Final   Creatinine, Ser 07/06/2021 0.79  0.44 - 1.00 mg/dL Final   Calcium 07/06/2021 8.8 (L)  8.9 - 10.3 mg/dL Final   Total Protein 07/06/2021 6.5  6.5 - 8.1 g/dL Final   Albumin 07/06/2021 3.9  3.5 - 5.0 g/dL Final   AST 07/06/2021 13 (L)  15 - 41 U/L Final   ALT 07/06/2021 14  0 - 44 U/L Final   Alkaline Phosphatase 07/06/2021 51  38 - 126 U/L Final   Total Bilirubin 07/06/2021 0.9  0.3 - 1.2 mg/dL Final   GFR, Estimated 07/06/2021 >60  >60 mL/min Final   Comment: (NOTE) Calculated using the CKD-EPI Creatinine Equation (2021)    Anion gap 07/06/2021 6  5 - 15 Final   Performed at [facility], [place] [address] [place], [place] [address]   WBC 07/06/2021 7.6  4.0 - 10.5 K/uL Final   RBC 07/06/2021 4.65  3.87 - 5.11 MIL/uL Final   Hemoglobin 07/06/2021 14.7  12.0 - 15.0 g/dL Final   HCT 07/06/2021 45.4  36.0 - 46.0 % Final   MCV 07/06/2021 97.6  80.0 - 100.0 fL Final   MCH 07/06/2021 31.6  26.0 - 34.0 pg Final   MCHC 07/06/2021 32.4  30.0 - 36.0 g/dL Final   RDW 07/06/2021 13.4  11.5 - 15.5 % Final   Platelets 07/06/2021 167  150 - 400 K/uL Final   nRBC 07/06/2021 0.0  0.0 - 0.2 % Final   Performed at [facility], [place] [address], [place], [place] [address]     X-Rays:No results found.  EKG: Orders placed or performed during the hospital encounter of 01/14/21   ED EKG   ED EKG   EKG     Hospital Course: Wanda Lang is a 71 y.o. who was admitted  to [facility]. They were brought to the operating room on 07/18/2021 and underwent Procedure(s): TOTAL KNEE ARTHROPLASTY.  Patient tolerated the procedure well and was later transferred to the recovery room and then to the orthopaedic floor for postoperative care. They were given PO and IV analgesics for pain control following their surgery. They were given 24 hours of postoperative antibiotics of  Anti-infectives (From admission, onward)    Start     Dose/Rate Route Frequency Ordered Stop   07/18/21 2200  hydroxychloroquine (PLAQUENIL) tablet 200 mg  Status:  Discontinued        200 mg Oral 2 times daily 07/18/21 1543 07/20/21 1648   07/18/21 1800  ceFAZolin (ANCEF) IVPB 2g/100 mL premix        2 g 200 mL/hr over 30 Minutes Intravenous Every 6 hours 07/18/21 1543 07/19/21 0032   07/18/21 1145  ceFAZolin (ANCEF) IVPB 2g/100 mL premix        2 g 200 mL/hr over 30 Minutes Intravenous On call to O.R. 07/18/21 1138 07/18/21 1253      and started on DVT prophylaxis in the form of Aspirin.   PT and OT were ordered for total joint protocol. Discharge planning consulted to help with postop disposition and equipment needs. Patient had a fair night on the evening  of surgery. She had difficulty related to back flare with radiculopathy. They started to get up OOB with therapy on POD #1.  Continued to work with therapy into POD #2. Pt was seen during rounds on day two and was ready to go home pending progress with therapy. Pt worked with therapy for two additional sessions and was meeting their goals. She was discharged to home later that day in stable condition.  Diet: Regular diet Activity: WBAT Follow-up: in 2 weeks Disposition: Home Discharged Condition: good   Discharge Instructions     Call MD / Call 911   Complete by: As directed    If you experience chest pain or shortness of breath, CALL 911 and be transported to the hospital emergency room.  If you develope a fever above 101 F, pus  (white drainage) or increased drainage or redness at the wound, or calf pain, call your surgeon's office.   Change dressing   Complete by: As directed    Maintain surgical dressing until follow up in the clinic. If the edges start to pull up, may reinforce with tape. If the dressing is no longer working, may remove and cover with gauze and tape, but must keep the area dry and clean.  Call with any questions or concerns.   Constipation Prevention   Complete by: As directed    Drink plenty of fluids.  Prune juice may be helpful.  You may use a stool softener, such as Colace (over the counter) 100 mg twice a day.  Use MiraLax (over the counter) for constipation as needed.   Diet - low sodium heart healthy   Complete by: As directed    Increase activity slowly as tolerated   Complete by: As directed    Weight bearing as tolerated with assist device (walker, cane, etc) as directed, use it as long as suggested by your surgeon or therapist, typically at least 4-6 weeks.   Post-operative opioid taper instructions:   Complete by: As directed    POST-OPERATIVE OPIOID TAPER INSTRUCTIONS: It is important to wean off of your opioid medication as soon as possible. If you do not need pain medication after your surgery it is ok to stop day one. Opioids include: Codeine, Hydrocodone(Norco, Vicodin), Oxycodone(Percocet, oxycontin) and hydromorphone amongst others.  Long term and even short term use of opiods can cause: Increased pain response Dependence Constipation Depression Respiratory depression And more.  Withdrawal symptoms can include Flu like symptoms Nausea, vomiting And more Techniques to manage these symptoms Hydrate well Eat regular healthy meals Stay active Use relaxation techniques(deep breathing, meditating, yoga) Do Not substitute Alcohol to help with tapering If you have been on opioids for less than two weeks and do not have pain than it is ok to stop all together.  Plan to wean  off of opioids This plan should start within one week post op of your joint replacement. Maintain the same interval or time between taking each dose and first decrease the dose.  Cut the total daily intake of opioids by one tablet each day Next start to increase the time between doses. The last dose that should be eliminated is the evening dose.      TED hose   Complete by: As directed    Use stockings (TED hose) for 2 weeks on both leg(s).  You may remove them at night for sleeping.      Allergies as of 07/20/2021       Reactions   Penicillins Itching  Tolerated Cephalosporin 07/18/21.   Amoxicillin Itching   Other    Dairy products - belly aches and facial rash    Rosanil Cleanser [sulfacetamide Sodium-sulfur] Other (See Comments)   Unknown reaction    Sulfonamide Derivatives    Unknown reaction    Ylang-ylang [cananga Oil (ylang-ylang)] Itching        Medication List     STOP taking these medications    predniSONE 10 MG tablet Commonly known as: DELTASONE   tiZANidine 2 MG tablet Commonly known as: ZANAFLEX       TAKE these medications    acetaminophen 325 MG tablet Commonly known as: TYLENOL Take 3 tablets (975 mg total) by mouth every 6 (six) hours. What changed:  medication strength how much to take when to take this reasons to take this   aspirin 81 MG chewable tablet Chew 1 tablet (81 mg total) by mouth 2 (two) times daily for 28 days.   BLUE-EMU MAXIMUM STRENGTH EX Apply 1 application. topically daily.   CALCIUM PO Take 1,200 mg by mouth daily.   celecoxib 200 MG capsule Commonly known as: CELEBREX Take 1 capsule (200 mg total) by mouth 2 (two) times daily. Do not start this medication until you finish the prednisone dose pack. What changed:  when to take this additional instructions   cycloSPORINE 0.05 % ophthalmic emulsion Commonly known as: RESTASIS Place 1 drop into both eyes 2 (two) times daily.   docusate sodium 100 MG  capsule Commonly known as: COLACE Take 1 capsule (100 mg total) by mouth 2 (two) times daily.   estradiol 0.05 MG/24HR patch Commonly known as: VIVELLE-DOT Place 1 patch onto the skin 2 (two) times a week.   Estradiol 10 MCG Tabs vaginal tablet Place 10 mcg vaginally every Monday, Wednesday, and Friday.   Flaxseed Oil 1200 MG Caps Take 1,200 mg by mouth daily.   fluticasone 50 MCG/ACT nasal spray Commonly known as: FLONASE Place 2 sprays into both nostrils daily. What changed:  when to take this reasons to take this   Glucosamine HCl 1000 MG Tabs Take 1,000 mg by mouth daily.   hydroxychloroquine 200 MG tablet Commonly known as: PLAQUENIL Take 200 mg by mouth 2 (two) times daily.   lidocaine 4 % Place 1 patch onto the skin daily as needed (pain).   lidocaine 5 % Commonly known as: Lidoderm Place 1 patch onto the skin every 12 (twelve) hours. Remove & Discard patch within 12 hours or as directed by MD   magnesium gluconate 500 MG tablet Commonly known as: MAGONATE Take 500 mg by mouth 3 (three) times a week.   methocarbamol 500 MG tablet Commonly known as: ROBAXIN Take 1 tablet (500 mg total) by mouth every 6 (six) hours as needed for muscle spasms.   OVER THE COUNTER MEDICATION Take 1 mL by mouth daily. CBD oil   oxyCODONE 5 MG immediate release tablet Commonly known as: Oxy IR/ROXICODONE Take 1-2 tablets (5-10 mg total) by mouth every 4 (four) hours as needed for severe pain. Start with 1 tablet every 4 hours as needed. Only use 2 tablets for severe pain.   polyethylene glycol 17 g packet Commonly known as: MIRALAX / GLYCOLAX Take 17 g by mouth daily as needed for mild constipation.   POTASSIUM PO Take 3 tablets by mouth daily.   pseudoephedrine 120 MG 12 hr tablet Commonly known as: SUDAFED Take 60-120 mg by mouth daily as needed for congestion.   TURMERIC CURCUMIN PO Take 1  capsule by mouth daily.   TURMERIC CURCUMIN PO Take 1 tablet by mouth  daily.   Vitamin D (Ergocalciferol) 1.25 MG (50000 UNIT) Caps capsule Commonly known as: DRISDOL Take 50,000 Units by mouth every 14 (fourteen) days.   zinc gluconate 50 MG tablet Take 50 mg by mouth 3 (three) times a week.               Discharge Care Instructions  (From admission, onward)           Start     Ordered   07/20/21 0000  Change dressing       Comments: Maintain surgical dressing until follow up in the clinic. If the edges start to pull up, may reinforce with tape. If the dressing is no longer working, may remove and cover with gauze and tape, but must keep the area dry and clean.  Call with any questions or concerns.   07/20/21 0847            Follow-up Information     Paralee Cancel, MD. Go on 08/02/2021.   Specialty: Orthopedic Surgery Why: You are scheduled for first post op appointment on Wednesday June 28th at 9:15am. Contact information: [address] [place] [address] [phone]                 Signed: Griffith Citron, PA-C Orthopedic Surgery 07/27/2021, 10:52 AM

## 2021-07-28 DIAGNOSIS — M25562 Pain in left knee: Secondary | ICD-10-CM | POA: Diagnosis not present

## 2021-07-31 DIAGNOSIS — M25562 Pain in left knee: Secondary | ICD-10-CM | POA: Diagnosis not present

## 2021-08-01 ENCOUNTER — Other Ambulatory Visit: Payer: Self-pay | Admitting: Orthopedic Surgery

## 2021-08-01 DIAGNOSIS — G8929 Other chronic pain: Secondary | ICD-10-CM

## 2021-08-02 DIAGNOSIS — M25562 Pain in left knee: Secondary | ICD-10-CM | POA: Diagnosis not present

## 2021-08-04 ENCOUNTER — Emergency Department: Payer: Medicare PPO

## 2021-08-04 ENCOUNTER — Emergency Department
Admission: EM | Admit: 2021-08-04 | Discharge: 2021-08-04 | Disposition: A | Discharge status: Home / Self Care | Payer: Medicare PPO | Attending: Emergency Medicine | Admitting: Emergency Medicine

## 2021-08-04 ENCOUNTER — Telehealth: Payer: Self-pay | Admitting: Family Medicine

## 2021-08-04 ENCOUNTER — Other Ambulatory Visit: Payer: Self-pay

## 2021-08-04 DIAGNOSIS — M7122 Synovial cyst of popliteal space [Baker], left knee: Secondary | ICD-10-CM | POA: Diagnosis not present

## 2021-08-04 DIAGNOSIS — I890 Lymphedema, not elsewhere classified: Secondary | ICD-10-CM | POA: Diagnosis not present

## 2021-08-04 DIAGNOSIS — Z96652 Presence of left artificial knee joint: Secondary | ICD-10-CM | POA: Insufficient documentation

## 2021-08-04 DIAGNOSIS — R600 Localized edema: Secondary | ICD-10-CM

## 2021-08-04 DIAGNOSIS — M7989 Other specified soft tissue disorders: Secondary | ICD-10-CM | POA: Diagnosis present

## 2021-08-04 DIAGNOSIS — M25562 Pain in left knee: Secondary | ICD-10-CM | POA: Diagnosis not present

## 2021-08-04 LAB — BASIC METABOLIC PANEL
Anion gap: 8 (ref 5–15)
BUN: 21 mg/dL (ref 8–23)
CO2: 25 mmol/L (ref 22–32)
Calcium: 8.9 mg/dL (ref 8.9–10.3)
Chloride: 107 mmol/L (ref 98–111)
Creatinine, Ser: 0.63 mg/dL (ref 0.44–1.00)
GFR, Estimated: >60 mL/min (ref >60)
Glucose, Bld: 114 mg/dL — ABNORMAL HIGH (ref 70–99)
Potassium: 4.1 mmol/L (ref 3.5–5.1)
Sodium: 140 mmol/L (ref 135–145)

## 2021-08-04 LAB — CBC
HCT: 38.9 % (ref 36.0–46.0)
Hemoglobin: 12.4 g/dL (ref 12.0–15.0)
MCH: 31.3 pg (ref 26.0–34.0)
MCHC: 31.9 g/dL (ref 30.0–36.0)
MCV: 98.2 fL (ref 80.0–100.0)
Platelets: 217 10*3/uL (ref 150–400)
RBC: 3.96 MIL/uL (ref 3.87–5.11)
RDW: 13.6 % (ref 11.5–15.5)
WBC: 6.4 10*3/uL (ref 4.0–10.5)
nRBC: 0 % (ref 0.0–0.2)

## 2021-08-04 NOTE — Telephone Encounter (Signed)
Pt called in stating she had knee surgery on June 13th. Pt states that post surgery both of her legs and feet are swollen. Pt states her right leg is leaking fluid. Sent to access nurse

## 2021-08-04 NOTE — ED Notes (Signed)
Pt had total left knee replacement on 6/13. Pt has been going to PT and has been doing well. No discharge from incision. Pt said stopped wearing compressions socks Saturday and began having more swelling on Tuesday with weeping from right leg 2 days ago.

## 2021-08-04 NOTE — ED Provider Triage Note (Signed)
Emergency Medicine Provider Triage Evaluation Note  Wanda Lang , a 71 y.o. female  was evaluated in triage.  Pt complains of bilateral lower extremity edema.  She had left knee surgery 2 weeks ago and was cleared to stop wearing her compression stockings 5 days ago.  Since that time she has noticed increase swelling of both legs and today noticed some weeping from the right lower extremity.  No history of CHF or peripheral vascular disease.  Physical Exam  There were no vitals taken for this visit. Gen:   Awake, no distress   Resp:  Normal effort  MSK:   Moves extremities without difficulty--limited LLE due to recent surgery Other:  Bilateral LE pitting edema without erythema  Medical Decision Making  Medically screening exam initiated at 6:44 PM.  Appropriate orders placed.  Wanda Lang was informed that the remainder of the evaluation will be completed by another provider, this initial triage assessment does not replace that evaluation, and the importance of remaining in the ED until their evaluation is complete.    Victorino Dike, FNP 08/04/21 [phone]

## 2021-08-04 NOTE — ED Provider Notes (Signed)
[facility] Provider [facility]    Event Date/Time   First MD Initiated Contact with Patient 08/04/21 2140     (approximate)   History   Leg Swelling   HPI  Wanda Lang is a 71 y.o. female who presents to the ED for evaluation of Leg Swelling   I review PCP visit from 6/2.  Obese patient with history of arthritis.  She had a TKA on the left done on 6/13.  Patient presents to the ED, accompanied by her pastor, for the ration of increased lower extremity swelling.  She reports chronic swelling, edema and lymphedema to her bilateral lower extremities but they have been worse since her TKA performed a couple weeks ago.  She reports that they have been dramatically worse the past couple days since she stopped using her compression stockings.  She was told by orthopedics team that she no longer needed her compression stockings regarding the surgical incisional wound to her knee, so she stopped using it altogether. Further reports being less active in the setting of chronic lower back pain and this knee replacements has been more sedentary.  Reports weeping of clear fluid from a minor abrasion to her left ankle and she is not accustomed to this weeping so she presents to the ED for evaluation.   Physical Exam   Triage Vital Signs: ED Triage Vitals  Enc Vitals Group     BP 08/04/21 1846 (!) 135/122     Pulse Rate 08/04/21 1846 98     Resp 08/04/21 1846 18     Temp 08/04/21 1846 98.2 F (36.8 C)     Temp Source 08/04/21 1846 Oral     SpO2 08/04/21 1846 98 %     Weight 08/04/21 1844 221 lb (100.2 kg)     Height 08/04/21 1844 '5\' 5"'$  (1.651 m)     Head Circumference --      Peak Flow --      Pain Score 08/04/21 1843 0     Pain Loc --      Pain Edu? --      Excl. in [place]? --     Most recent vital signs: Vitals:   08/04/21 2109 08/04/21 2150  BP: 124/73 137/74  Pulse: 96 98  Resp: 18 18  Temp: 98.7 F (37.1 C)   SpO2: 96% 99%    General: Awake, no  distress.  CV:  Good peripheral perfusion.  Resp:  Normal effort.  Abd:  No distention.  MSK:  Pitting edema to bilateral lower extremities to the knees.  Well-healed surgical incision anteriorly over the left knee without any purulence, dehiscence or surrounding erythema or induration.  Neuro:  No focal deficits appreciated. Other:     ED Results / Procedures / Treatments   Labs (all labs ordered are listed, but only abnormal results are displayed) Labs Reviewed  BASIC METABOLIC PANEL - Abnormal; Notable for the following components:      Result Value   Glucose, Bld 114 (*)    All other components within normal limits  CBC    EKG   RADIOLOGY Venous ultrasound interpreted by me without evidence of DVT  Official radiology report(s): US Venous Img Lower Bilateral  Result Date: 08/04/2021 CLINICAL DATA:  Lower extremity edema following left knee surgery 2 weeks ago, initial encounter EXAM: BILATERAL LOWER EXTREMITY VENOUS DOPPLER ULTRASOUND TECHNIQUE: Gray-scale sonography with graded compression, as well as color Doppler and duplex ultrasound were performed to evaluate the lower  extremity deep venous systems from the level of the common femoral vein and including the common femoral, femoral, profunda femoral, popliteal and calf veins including the posterior tibial, peroneal and gastrocnemius veins when visible. The superficial great saphenous vein was also interrogated. Spectral Doppler was utilized to evaluate flow at rest and with distal augmentation maneuvers in the common femoral, femoral and popliteal veins. COMPARISON:  None Available. FINDINGS: RIGHT LOWER EXTREMITY Common Femoral Vein: No evidence of thrombus. Normal compressibility, respiratory phasicity and response to augmentation. Saphenofemoral Junction: No evidence of thrombus. Normal compressibility and flow on color Doppler imaging. Profunda Femoral Vein: No evidence of thrombus. Normal compressibility and flow on color  Doppler imaging. Femoral Vein: No evidence of thrombus. Normal compressibility, respiratory phasicity and response to augmentation. Popliteal Vein: No evidence of thrombus. Normal compressibility, respiratory phasicity and response to augmentation. Calf Veins: No evidence of thrombus. Normal compressibility and flow on color Doppler imaging. Superficial Great Saphenous Vein: No evidence of thrombus. Normal compressibility. Venous Reflux:  None. Other Findings:  None. LEFT LOWER EXTREMITY Common Femoral Vein: No evidence of thrombus. Normal compressibility, respiratory phasicity and response to augmentation. Saphenofemoral Junction: No evidence of thrombus. Normal compressibility and flow on color Doppler imaging. Profunda Femoral Vein: No evidence of thrombus. Normal compressibility and flow on color Doppler imaging. Femoral Vein: No evidence of thrombus. Normal compressibility, respiratory phasicity and response to augmentation. Popliteal Vein: No evidence of thrombus. Normal compressibility, respiratory phasicity and response to augmentation. Calf Veins: No evidence of thrombus. Normal compressibility and flow on color Doppler imaging. Superficial Great Saphenous Vein: No evidence of thrombus. Normal compressibility. Venous Reflux:  None. Other Findings: Small popliteal cyst is noted measuring 2.9 x 0.7 x 1.5 cm. IMPRESSION: No evidence of deep venous thrombosis in either lower extremity. Small left popliteal cyst. Electronically Signed   By: Inez Catalina M.D.   On: 08/04/2021 20:45    PROCEDURES and INTERVENTIONS:  Procedures  Medications - No data to display   IMPRESSION / MDM / [place] / ED COURSE  I reviewed the triage vital signs and the nursing notes.  Differential diagnosis includes, but is not limited to, lymphedema, new onset CHF, cellulitis, DVT  71 year old patient presents to the ED with acute on chronic lower extremity edema, likely due to her increased sedentary nature from  her recent surgery as well as no longer using her compression stockings.  She looks systemically well.  Less likely cirrhosis or new onset CHF as she has no cardiopulmonary symptoms or stigmata or risk factors for cirrhosis.  CBC is normal and metabolic panel is normal as well.  DVT ultrasound without evidence of DVT.  No overlying cellulitis noted.  We discussed conservative measures and management at home.  Discussed return precautions.       FINAL CLINICAL IMPRESSION(S) / ED DIAGNOSES   Final diagnoses:  None     Rx / DC Orders   ED Discharge Orders     None        Note:  This document was prepared using Dragon voice recognition software and may include unintentional dictation errors.   Vladimir Crofts, MD 08/05/21 [phone]

## 2021-08-04 NOTE — ED Triage Notes (Signed)
Pt to ED via POV from home. Pt had left knee surgery x2 weeks ago. Pt states she stopped using her compression socks on Saturday. Pt reports she has noticed increased bilateral leg swelling and also weeping to right leg.

## 2021-08-04 NOTE — ED Notes (Signed)
Pt declined ace wraps and decided that she would wear compression stockings at home

## 2021-08-07 DIAGNOSIS — M25562 Pain in left knee: Secondary | ICD-10-CM | POA: Diagnosis not present

## 2021-08-07 NOTE — Telephone Encounter (Signed)
Pt went to ED on 08/04/21

## 2021-08-07 NOTE — Telephone Encounter (Signed)
Noted. Please follow-up with the patient to see how her legs are doing.

## 2021-08-09 ENCOUNTER — Ambulatory Visit
Admission: RE | Admit: 2021-08-09 | Discharge: 2021-08-09 | Disposition: A | Discharge status: Home / Self Care | Payer: Medicare PPO | Source: Ambulatory Visit | Attending: Orthopedic Surgery | Admitting: Orthopedic Surgery

## 2021-08-09 DIAGNOSIS — M47816 Spondylosis without myelopathy or radiculopathy, lumbar region: Secondary | ICD-10-CM | POA: Diagnosis not present

## 2021-08-09 DIAGNOSIS — G8929 Other chronic pain: Secondary | ICD-10-CM

## 2021-08-09 MED ORDER — IOPAMIDOL (ISOVUE-M 200) INJECTION 41%
1.0000 mL | Freq: Once | INTRAMUSCULAR | Status: AC
  Administered 2021-08-09: 1 mL via EPIDURAL

## 2021-08-09 MED ORDER — METHYLPREDNISOLONE ACETATE 40 MG/ML INJ SUSP (RADIOLOG
80.0000 mg | Freq: Once | INTRAMUSCULAR | Status: AC
  Administered 2021-08-09: 80 mg via EPIDURAL

## 2021-08-09 NOTE — Telephone Encounter (Signed)
I followed up with the patient to see how her legs were doing and she stated they are much better, she started wearing compression hoses and it is much better.  Wanda Lang,cma

## 2021-08-09 NOTE — Telephone Encounter (Signed)
Noted  

## 2021-08-09 NOTE — Discharge Instructions (Signed)

## 2021-08-11 ENCOUNTER — Telehealth: Payer: Self-pay | Admitting: Family Medicine

## 2021-08-11 DIAGNOSIS — M549 Dorsalgia, unspecified: Secondary | ICD-10-CM

## 2021-08-11 NOTE — Telephone Encounter (Signed)
Pt called in requesting a referral for orthopedic.... Pt stated that she is experiencing lower back pain... Pt stated she would like for the referral to go to Dr. Alysia Penna... Pt requesting callback

## 2021-08-14 DIAGNOSIS — M25562 Pain in left knee: Secondary | ICD-10-CM | POA: Diagnosis not present

## 2021-08-14 DIAGNOSIS — M25662 Stiffness of left knee, not elsewhere classified: Secondary | ICD-10-CM | POA: Diagnosis not present

## 2021-08-14 NOTE — Telephone Encounter (Signed)
Referral placed. Please see what symptoms she is having. How long have they been going on? Any numbness or weakness in her legs? Any loss of bowel or bladder control?

## 2021-08-15 DIAGNOSIS — M0589 Other rheumatoid arthritis with rheumatoid factor of multiple sites: Secondary | ICD-10-CM | POA: Diagnosis not present

## 2021-08-15 NOTE — Telephone Encounter (Signed)
I called and spoke with the patient and let her know that the referral was placed and she understood, I asked the patient what symptoms she was having and she stated lower back pain that radiates into her hip, X 2 months, she has no weakness or numbness just can't hardly put weigh on her legs and she has no loss of bladder or bowels.  Chieko Neises,cma

## 2021-08-15 NOTE — Telephone Encounter (Signed)
Noted.  If she is not able to get into see Dr. Providence Lanius soon she could always schedule an appointment with [place] to evaluate her back as well.

## 2021-08-16 DIAGNOSIS — M25562 Pain in left knee: Secondary | ICD-10-CM | POA: Diagnosis not present

## 2021-08-21 DIAGNOSIS — M25562 Pain in left knee: Secondary | ICD-10-CM | POA: Diagnosis not present

## 2021-08-23 DIAGNOSIS — R32 Unspecified urinary incontinence: Secondary | ICD-10-CM | POA: Diagnosis not present

## 2021-08-23 DIAGNOSIS — M25562 Pain in left knee: Secondary | ICD-10-CM | POA: Diagnosis not present

## 2021-08-23 DIAGNOSIS — D8481 Immunodeficiency due to conditions classified elsewhere: Secondary | ICD-10-CM | POA: Diagnosis not present

## 2021-08-23 DIAGNOSIS — H04129 Dry eye syndrome of unspecified lacrimal gland: Secondary | ICD-10-CM | POA: Diagnosis not present

## 2021-08-23 DIAGNOSIS — D84821 Immunodeficiency due to drugs: Secondary | ICD-10-CM | POA: Diagnosis not present

## 2021-08-23 DIAGNOSIS — N952 Postmenopausal atrophic vaginitis: Secondary | ICD-10-CM | POA: Diagnosis not present

## 2021-08-23 DIAGNOSIS — M069 Rheumatoid arthritis, unspecified: Secondary | ICD-10-CM | POA: Diagnosis not present

## 2021-08-23 DIAGNOSIS — M1990 Unspecified osteoarthritis, unspecified site: Secondary | ICD-10-CM | POA: Diagnosis not present

## 2021-08-23 DIAGNOSIS — R030 Elevated blood-pressure reading, without diagnosis of hypertension: Secondary | ICD-10-CM | POA: Diagnosis not present

## 2021-08-24 ENCOUNTER — Encounter: Payer: Medicare PPO | Attending: Physical Medicine & Rehabilitation | Admitting: Physical Medicine & Rehabilitation

## 2021-08-24 ENCOUNTER — Encounter: Payer: Self-pay | Admitting: Physical Medicine & Rehabilitation

## 2021-08-24 VITALS — BP 111/72 | HR 93 | Ht 65.0 in | Wt 209.6 lb

## 2021-08-24 DIAGNOSIS — M461 Sacroiliitis, not elsewhere classified: Secondary | ICD-10-CM | POA: Insufficient documentation

## 2021-08-24 DIAGNOSIS — M533 Sacrococcygeal disorders, not elsewhere classified: Secondary | ICD-10-CM | POA: Diagnosis not present

## 2021-08-24 DIAGNOSIS — M48061 Spinal stenosis, lumbar region without neurogenic claudication: Secondary | ICD-10-CM | POA: Insufficient documentation

## 2021-08-24 DIAGNOSIS — M47818 Spondylosis without myelopathy or radiculopathy, sacral and sacrococcygeal region: Secondary | ICD-10-CM | POA: Insufficient documentation

## 2021-08-24 NOTE — Patient Instructions (Signed)
Call ortho for oxycodone refill if needed Try reducing methocarbamol to 2-3 times per day   Sacroiliac Joint Injection A sacroiliac (SI) joint injection is a procedure to inject a numbing medicine (anesthetic block)--and sometimes a strong anti-inflammatory medicine (steroid)--into the SI joint. The SI joint is the joint between two bones of the pelvis called the sacrum and the ilium. The sacrum is the bone at the base of the spine. The ilium is the large bone that forms the hip. You may need this procedure if you have pain because of an inflamed or diseased SI joint. Various conditions can cause pain in the SI joint, including rheumatoid arthritis, gout, psoriatic arthritis, infection, or injury. SI joint pain is a common cause of lower back pain. It may also cause pain in your buttocks or leg. SI joint injection may be done to: Find out if an anesthetic block relieves pain. This can confirm that the SI joint is the cause of pain (diagnostic use). Treat a painful SI joint with steroids, anesthetic medicine, or both (therapeutic use). Tell a health care provider about: Any allergies you have. All medicines you are taking, including vitamins, herbs, eye drops, creams, and over-the-counter medicines. Any problems you or family members have had with anesthetic medicines. Any blood disorders you have. Any surgeries you have had. Any medical conditions you have. Whether you are pregnant or may be pregnant. What are the risks? Generally, this is a safe procedure. However, problems may occur, including: Infection. Bleeding. Nerve injury. Temporary increase in pain or failure to relieve pain. Headache. Bruising or soreness at the joint, in deep tissues, or at the injection site. Allergic reactions to medicines or dyes. There may also be side effects from the steroid medicine. These may include facial flushing, increased appetite, diarrhea, and increased blood sugar. What happens before the  procedure? Medicines Ask your health care provider about: Changing or stopping your regular medicines. This is especially important if you are taking diabetes medicines or blood thinners. Taking medicines such as aspirin and ibuprofen. These medicines can thin your blood. Do not take these medicines unless your health care provider tells you to take them. Taking over-the-counter medicines, vitamins, herbs, and supplements. General instructions You may have a physical exam. You may have imaging tests, such as an X-ray, CT scan, or MRI. Follow instructions from your health care provider about eating or drinking restrictions. Ask your health care provider what steps will be taken to help prevent infection. This may include washing skin with a germ-killing soap. Plan to have a responsible adult take you home from the hospital or clinic. What happens during the procedure?  You will be awake during the procedure and may be given one or more of the following: A medicine to help you relax (sedative). A medicine to numb the area (local anesthetic). Your health care provider will inject a local anesthetic into the skin above your SI joint. You will be placed in the proper position on a procedure table to give the health care team the best access to your SI joint. An X-ray machine that produces moving X-ray images (fluoroscopy) will be placed above the procedure table. A long, thin needle will be inserted through your skin and down to your SI joint. The position of the needle will be checked with fluoroscopy imaging. An X-ray dye will be injected to make sure the needle enters the joint space. You may be asked if you feel any pain. Long-acting anesthetic medicine will be injected. Long-acting  steroid medicine may also be injected. The needle will be removed, and a bandage will be placed over the injection site. The procedure may vary among health care providers and hospitals. What happens after the  procedure? Your blood pressure, heart rate, breathing rate, and blood oxygen level will be monitored until you leave the hospital or clinic. Since dye was used, you will be told to drink plenty of water to wash (flush) the dye out of your body. You may be asked if you have pain relief from the injection. You will likely be able to go home shortly after the procedure. Your health care provider will give you instructions for taking care of yourself after the procedure. These may include instructions for doing physical therapy exercises. If you were given a sedative during the procedure, it can affect you for several hours. Do not drive or operate machinery until your health care provider says that it is safe. Summary A sacroiliac (SI) joint injection is a procedure to inject a numbing medicine (anesthetic block)--and sometimes a strong anti-inflammatory medicine (steroid)--into the SI joint. You will be awake during the procedure. You may be given a sedative, a local anesthetic, or both. If you were given a sedative during the procedure, it can affect you for several hours. This information is not intended to replace advice given to you by your health care provider. Make sure you discuss any questions you have with your health care provider. Document Revised: 06/04/2019 Document Reviewed: 06/04/2019 Elsevier Patient Education  [place].

## 2021-08-24 NOTE — Progress Notes (Signed)
Subjective:    Patient ID: Wanda Lang, female    DOB: 01-31-1951, 71 y.o.   MRN: [id number]  HPI CC:  Acute exacerbation of chronic low back pain  Recent L TKR 07/18/2021 Recent Right hip injection under fluoro,  7/5 /23 Radiology did L5-S1 ESI  Takes tramadol but makes her drowsy Takes Oxycodone from Ortho post op TKR  RIght sided LBP, only one episode of sciatic symptoms Since epidural  more lateral  Takes Celebrex for OA Orencia for RA Takes acetaminophen '1000mg'$  TID Methocarbamol '500mg'$  q 6 h- not sure if it helps   No anticoagulants   CLINICAL DATA:  Spondylosis without myelopathy. Chronic low back pain and leg pain right worse than left.   FLUOROSCOPY: Radiation Exposure Index (as provided by the fluoroscopic device): 0 minutes 36 seconds. 45.83 micro gray meter squared   PROCEDURE: The procedure, risks, benefits, and alternatives were explained to the patient. Questions regarding the procedure were encouraged and answered. The patient understands and consents to the procedure.   LUMBAR EPIDURAL INJECTION:   An interlaminar approach was performed on the right at L5-S1. The overlying skin was cleansed and anesthetized. A 20 gauge epidural needle was advanced using loss-of-resistance technique.   DIAGNOSTIC EPIDURAL INJECTION:   Injection of Isovue-M 200 shows a good epidural pattern with spread above and below the level of needle placement, primarily on the right. No vascular opacification is seen.   THERAPEUTIC EPIDURAL INJECTION:   Eighty mg of Depo-Medrol mixed with 2.5 cc 1% lidocaine were instilled. The procedure was well-tolerated, and the patient was discharged thirty minutes following the injection in good condition.   COMPLICATIONS: None   IMPRESSION: Technically successful epidural injection on the right at L5-S1 # 1.     Electronically Signed   By: Nelson Chimes M.D.   On: 08/09/2021 11:29  Pain Inventory Average Pain 6 Pain Right  Now 5 My pain is sharp, stabbing, and aching  In the last 24 hours, has pain interfered with the following? General activity 6 Relation with others 1 Enjoyment of life 6 What TIME of day is your pain at its worst? morning  Sleep (in general) Fair  Pain is worse with: bending and some activites Pain improves with: rest, heat/ice, medication, and injections Relief from Meds: 10  use a walker ability to climb steps?  yes do you drive?  yes Do you have any goals in this area?  yes  not employed: date last employed 2013 I need assistance with the following:  household duties and shopping Do you have any goals in this area?  yes  weakness trouble walking  Any changes since last visit?  yes  Has MRI CD from [facility]  Primary care [name] MD Orthopedist [facility]    Family History  Problem Relation Age of Onset   Alcohol abuse Mother    Arthritis Mother    Hyperlipidemia Mother    Heart disease Mother    Stroke Mother    Hypertension Mother    Depression Mother    Anxiety disorder Mother    Arthritis Father    Lung cancer Paternal Grandfather    Kidney disease Paternal Grandfather    Social History   Socioeconomic History   Marital status: Widowed    Spouse name: Not on file   Number of children: Not on file   Years of education: Not on file   Highest education level: Not on file  Occupational History   Not  on file  Tobacco Use   Smoking status: Never   Smokeless tobacco: Never  Vaping Use   Vaping Use: Never used  Substance and Sexual Activity   Alcohol use: Yes    Alcohol/week: 1.0 standard drink of alcohol    Types: 1 Glasses of wine per week   Drug use: No   Sexual activity: Never  Other Topics Concern   Not on file  Social History Narrative   Not on file   Social Determinants of Health   Financial Resource Strain: Low Risk  (07/13/2021)   Overall Financial Resource Strain (CARDIA)    Difficulty of Paying Living Expenses: Not hard  at all  Food Insecurity: No Food Insecurity (07/13/2021)   Hunger Vital Sign    Worried About Running Out of Food in the Last Year: Never true    Ran Out of Food in the Last Year: Never true  Transportation Needs: No Transportation Needs (07/13/2021)   PRAPARE - Hydrologist (Medical): No    Lack of Transportation (Non-Medical): No  Physical Activity: Not on file  Stress: No Stress Concern Present (07/13/2021)   [place]    Feeling of Stress : Not at all  Social Connections: Unknown (07/13/2021)   Social Connection and Isolation Panel [NHANES]    Frequency of Communication with Friends and Family: More than three times a week    Frequency of Social Gatherings with Friends and Family: More than three times a week    Attends Religious Services: More than 4 times per year    Active Member of [employer] or Organizations: Not on file    Attends Archivist Meetings: Not on file    Marital Status: Widowed   Past Surgical History:  Procedure Laterality Date   ABDOMINAL HYSTERECTOMY     COLONOSCOPY WITH PROPOFOL N/A 09/04/2018   Procedure: COLONOSCOPY WITH PROPOFOL;  Surgeon: Virgel Manifold, MD;  Location: [facility];  Service: Endoscopy;  Laterality: N/A;   ESOPHAGOGASTRODUODENOSCOPY (EGD) WITH PROPOFOL N/A 09/04/2018   Procedure: ESOPHAGOGASTRODUODENOSCOPY (EGD) WITH PROPOFOL;  Surgeon: Virgel Manifold, MD;  Location: [facility] END[facility]COPY;  Service: Endoscopy;  Laterality: N/A;   LAPAROSCOPY     TONSILLECTOMY     TOTAL KNEE ARTHROPLASTY Left 07/18/2021   Procedure: TOTAL KNEE ARTHROPLASTY;  Surgeon: Paralee Cancel, MD;  Location: [facility];  Service: Orthopedics;  Laterality: Left;   TURBINATE REDUCTION     Past Medical History:  Diagnosis Date   Allergy    Arthritis    Chronic kidney disease    Coronary artery disease    GERD (gastroesophageal reflux disease)    Heart murmur     Hypertension    LIVER FUNCTION TESTS, ABNORMAL, HX OF 09/08/2007   Qualifier: Diagnosis of  By: Nelson-Smith CMA (AAMA), Dottie     BP 111/72   Pulse 93   Ht '5\' 5"'$  (1.651 m)   Wt 209 lb 9.6 oz (95.1 kg)   SpO2 98%   BMI 34.88 kg/m   Opioid Risk Score:   Fall Risk Score:  `1  Depression screen [facility] 2/9     08/24/2021    9:21 AM 07/13/2021    8:55 AM 07/07/2021   11:54 AM 11/30/2020    9:47 AM 07/12/2020   10:52 AM 11/06/2019    4:04 PM 02/03/2019    9:49 AM  Depression screen PHQ 2/9  Decreased Interest 0 0 0 0 0 0 0  Down, Depressed, Hopeless 0 0 0 0 0 0 0  PHQ - 2 Score 0 0 0 0 0 0 0  Altered sleeping 0        Tired, decreased energy 1        Change in appetite 0        Feeling bad or failure about yourself  0        Trouble concentrating 0        Moving slowly or fidgety/restless 0        Suicidal thoughts 0        PHQ-9 Score 1        Difficult doing work/chores Not difficult at all          Review of Systems  Constitutional:  Positive for unexpected weight change.       Intentional weight loss  HENT: Negative.    Eyes: Negative.   Respiratory: Negative.    Cardiovascular: Negative.   Gastrointestinal: Negative.   Endocrine: Negative.   Genitourinary: Negative.   Musculoskeletal:  Positive for back pain and gait problem.  Skin: Negative.   Allergic/Immunologic: Negative.   Neurological:  Positive for weakness.  Hematological: Negative.   Psychiatric/Behavioral: Negative.    All other systems reviewed and are negative.      Objective:   Physical Exam Vitals and nursing note reviewed.  Constitutional:      Appearance: She is obese.  HENT:     Head: Normocephalic and atraumatic.  Eyes:     Extraocular Movements: Extraocular movements intact.     Conjunctiva/sclera: Conjunctivae normal.     Pupils: Pupils are equal, round, and reactive to light.  Skin:    General: Skin is warm and dry.     Comments: Motor strength is 5/5 bilateral deltoid, bicep, tricep,  grip, hip flexor, knee extensor, ankle dorsiflexor and plantar flexor  Neurological:     Mental Status: She is alert and oriented to person, place, and time.  Psychiatric:        Mood and Affect: Mood normal.        Behavior: Behavior normal.     Reduced RLE sensation lateral calf  THigh thrust + right  Tender to sensation RIght gluteal ROM - flexion limited by pain  Uses walker to ambulate  Negative bilateral single leg raise       Assessment & Plan:   1.  Chronic back pain approximately 97-monthduration.  MRI reviewed there is multilevel stenosis worse in the upper lumbar area L1-L2 3.  She does not have any radicular signs or symptoms.  No signs of myelopathy.    Symptoms are more below L5 and only [facility] most consistent with SI discomfort likely from antalgic, assymetric gait Will do SI injection Right side

## 2021-08-25 DIAGNOSIS — M48062 Spinal stenosis, lumbar region with neurogenic claudication: Secondary | ICD-10-CM | POA: Diagnosis not present

## 2021-08-25 DIAGNOSIS — Z5189 Encounter for other specified aftercare: Secondary | ICD-10-CM | POA: Diagnosis not present

## 2021-08-25 DIAGNOSIS — M7918 Myalgia, other site: Secondary | ICD-10-CM | POA: Diagnosis not present

## 2021-08-25 DIAGNOSIS — M5136 Other intervertebral disc degeneration, lumbar region: Secondary | ICD-10-CM | POA: Diagnosis not present

## 2021-08-28 DIAGNOSIS — M25562 Pain in left knee: Secondary | ICD-10-CM | POA: Diagnosis not present

## 2021-08-30 DIAGNOSIS — M25562 Pain in left knee: Secondary | ICD-10-CM | POA: Diagnosis not present

## 2021-08-30 DIAGNOSIS — M25662 Stiffness of left knee, not elsewhere classified: Secondary | ICD-10-CM | POA: Diagnosis not present

## 2021-09-04 DIAGNOSIS — M25562 Pain in left knee: Secondary | ICD-10-CM | POA: Diagnosis not present

## 2021-09-05 DIAGNOSIS — Z96651 Presence of right artificial knee joint: Secondary | ICD-10-CM | POA: Diagnosis not present

## 2021-09-05 DIAGNOSIS — M545 Low back pain, unspecified: Secondary | ICD-10-CM | POA: Diagnosis not present

## 2021-09-05 DIAGNOSIS — M5136 Other intervertebral disc degeneration, lumbar region: Secondary | ICD-10-CM | POA: Diagnosis not present

## 2021-09-06 DIAGNOSIS — M25562 Pain in left knee: Secondary | ICD-10-CM | POA: Diagnosis not present

## 2021-09-11 DIAGNOSIS — M25562 Pain in left knee: Secondary | ICD-10-CM | POA: Diagnosis not present

## 2021-09-12 DIAGNOSIS — M0589 Other rheumatoid arthritis with rheumatoid factor of multiple sites: Secondary | ICD-10-CM | POA: Diagnosis not present

## 2021-09-13 DIAGNOSIS — M25562 Pain in left knee: Secondary | ICD-10-CM | POA: Diagnosis not present

## 2021-09-20 DIAGNOSIS — M25562 Pain in left knee: Secondary | ICD-10-CM | POA: Diagnosis not present

## 2021-09-25 ENCOUNTER — Telehealth: Payer: Self-pay

## 2021-09-25 DIAGNOSIS — M5416 Radiculopathy, lumbar region: Secondary | ICD-10-CM | POA: Diagnosis not present

## 2021-09-25 NOTE — Telephone Encounter (Signed)
Patient called in to confirm what type of injection she is getting on 09/28/21, advised she is getting SI injection for her lower back pain.

## 2021-09-26 DIAGNOSIS — M25562 Pain in left knee: Secondary | ICD-10-CM | POA: Diagnosis not present

## 2021-09-28 ENCOUNTER — Encounter: Payer: Self-pay | Admitting: Physical Medicine & Rehabilitation

## 2021-09-28 ENCOUNTER — Encounter: Payer: Medicare PPO | Attending: Physical Medicine & Rehabilitation | Admitting: Physical Medicine & Rehabilitation

## 2021-09-28 VITALS — BP 128/85 | HR 78 | Ht 65.0 in | Wt 206.2 lb

## 2021-09-28 DIAGNOSIS — M25562 Pain in left knee: Secondary | ICD-10-CM | POA: Diagnosis not present

## 2021-09-28 DIAGNOSIS — M533 Sacrococcygeal disorders, not elsewhere classified: Secondary | ICD-10-CM | POA: Insufficient documentation

## 2021-09-28 MED ORDER — LIDOCAINE HCL (PF) 2 % IJ SOLN
2.0000 mL | Freq: Once | INTRAMUSCULAR | Status: AC
  Administered 2021-09-28: 2 mL

## 2021-09-28 MED ORDER — LIDOCAINE HCL 1 % IJ SOLN
5.0000 mL | Freq: Once | INTRAMUSCULAR | Status: DC
  Administered 2021-09-28: 5 mL

## 2021-09-28 MED ORDER — LIDOCAINE HCL 1 % IJ SOLN: 3.0000 mL | Freq: Once | INTRAMUSCULAR | Status: DC

## 2021-09-28 MED ORDER — BETAMETHASONE SOD PHOS & ACET 6 (3-3) MG/ML IJ SUSP
3.0000 mg | Freq: Once | INTRAMUSCULAR | Status: AC
  Administered 2021-09-28: 3 mg via INTRA_ARTICULAR

## 2021-09-28 NOTE — Progress Notes (Signed)
Right sacroiliac injection under fluoroscopic guidance  Indication: Right Low back and buttocks pain not relieved by medication management and other conservative care.  Informed consent was obtained after describing risks and benefits of the procedure with the patient, this includes bleeding, bruising, infection, paralysis and medication side effects. The patient wishes to proceed and has given written consent. The patient was placed in a prone position. The lumbar and sacral area was marked and prepped with Betadine. A 25-gauge 1-1/2 inch needle was inserted into the skin and subcutaneous tissue and 1 mL of 1% lidocaine was injected. Then a 25-gauge 3 inch spinal needle was inserted under fluoroscopic guidance into the Right sacroiliac joint. AP and lateral images were utilized. Omnipaque 180x0.5 mL under live fluoroscopy demonstrated no intravascular uptake. Then a solution containing one ML of 6 mgper ml betamethasone and 2 ML of 1% lidocaine MPF was injected x1.5 mL. Patient tolerated the procedure well. Post procedure instructions were given. Please see post procedure form.   Meds injected  Lidocaine 1% 352m from multidose vial Omnipaque 180- 236minjected and 52m41miscarded Celestone '6mg'$ /ml x .5ml34mom multidose vial

## 2021-09-28 NOTE — Progress Notes (Signed)
  PROCEDURE RECORD [facility]   Name: Wanda Lang DOB:11-Sep-1950 MRN: [id number]  Date:09/28/2021  Physician: Alysia Penna, MD    Nurse/CMA: Jorja Loa MA  Allergies:  Allergies  Allergen Reactions   Penicillins Itching    Tolerated Cephalosporin 07/18/21.   Other reaction(s): wheezing   Amoxicillin Itching   Misc. Sulfonamide Containing Compounds    Other     Dairy products - belly aches and facial rash    Rosanil Cleanser [Sulfacetamide Sodium-Sulfur] Other (See Comments)    Unknown reaction    Sulfonamide Derivatives     Unknown reaction    Ylang-Ylang [Cananga Oil (Ylang-Ylang)] Itching   Consent Signed: Yes.    Is patient diabetic? No.  CBG today? .  Pregnant: No. LMP: No LMP recorded. Patient is postmenopausal. (age 53-55)  Anticoagulants: no Anti-inflammatory: yes (Celebrex) Antibiotics: no  Procedure: Right Sacroiliac Injection  Position: Prone Start Time: 12:11 PM  End Time: 12:14 PM  Fluoro Time: 19  RN/CMA Jones MA  Gillermo Poch MA    Time 11:43 am 12:19 PM    BP 128/85 154/82    Pulse 78 76    Respirations 16 16    O2 Sat 97 97    S/S 6 6    Pain Level 4/10 0/10     D/C home with husband, patient A & O X 3, D/C instructions reviewed, and sits independently.

## 2021-09-28 NOTE — Patient Instructions (Signed)
Sacroiliac injection was performed today. A combination of numbing medicine (lidocaine) plus a cortisone medicine (betamethasone) was injected. The injection was done under x-ray guidance. This procedure has been performed to help reduce low back and buttocks pain as well as potentially hip pain. The duration of this injection is variable lasting from hours to  Months. It may repeated if needed. 

## 2021-09-28 NOTE — Addendum Note (Signed)
Addended by: Franchot Gallo on: 09/28/2021 03:08 PM   Modules accepted: Orders

## 2021-09-28 NOTE — Addendum Note (Signed)
Addended by: Franchot Gallo on: 09/28/2021 03:10 PM   Modules accepted: Orders

## 2021-09-29 DIAGNOSIS — M25561 Pain in right knee: Secondary | ICD-10-CM | POA: Diagnosis not present

## 2021-09-29 DIAGNOSIS — M1711 Unilateral primary osteoarthritis, right knee: Secondary | ICD-10-CM | POA: Diagnosis not present

## 2021-09-29 DIAGNOSIS — Z471 Aftercare following joint replacement surgery: Secondary | ICD-10-CM | POA: Diagnosis not present

## 2021-09-29 DIAGNOSIS — Z96652 Presence of left artificial knee joint: Secondary | ICD-10-CM | POA: Diagnosis not present

## 2021-10-03 DIAGNOSIS — M5451 Vertebrogenic low back pain: Secondary | ICD-10-CM | POA: Diagnosis not present

## 2021-10-06 ENCOUNTER — Encounter (HOSPITAL_COMMUNITY): Payer: Self-pay

## 2021-10-06 ENCOUNTER — Ambulatory Visit (HOSPITAL_COMMUNITY)
Admission: EM | Admit: 2021-10-06 | Discharge: 2021-10-06 | Disposition: A | Discharge status: Home / Self Care | Payer: Medicare PPO | Attending: Emergency Medicine | Admitting: Emergency Medicine

## 2021-10-06 DIAGNOSIS — N3000 Acute cystitis without hematuria: Secondary | ICD-10-CM | POA: Insufficient documentation

## 2021-10-06 DIAGNOSIS — M25562 Pain in left knee: Secondary | ICD-10-CM | POA: Diagnosis not present

## 2021-10-06 LAB — POCT URINALYSIS DIPSTICK, ED / UC
Bilirubin Urine: NEGATIVE
Glucose, UA: 100 mg/dL — AB
Ketones, ur: NEGATIVE mg/dL
Nitrite: POSITIVE — AB
Protein, ur: 30 mg/dL — AB
Specific Gravity, Urine: >=1.03 (ref 1.005–1.030)
Urobilinogen, UA: 1 mg/dL (ref 0.0–1.0)
pH: 5.5 (ref 5.0–8.0)

## 2021-10-06 MED ORDER — CEPHALEXIN 500 MG PO CAPS: 500.0000 mg | ORAL_CAPSULE | Freq: Two times a day (BID) | ORAL | 0 refills | Status: AC

## 2021-10-06 NOTE — ED Triage Notes (Signed)
Patient having vaginal discomfort, itching, urinary frequency and pain for a few days. Was called in yeast infection medication by another provider today and took diflucan.

## 2021-10-06 NOTE — Discharge Instructions (Addendum)
Your urinalysis shows Wanda Lang blood cells and nitrates which are indicative of infection, your urine will be sent to the lab to determine exactly which bacteria is present, if any changes need to be made to your medications you will be notified  Begin use of keflex twice a day for 5   You may use over-the-counter pyridium to help minimize your symptoms until antibiotic removes bacteria, this medication will turn your urine orange  Increase your fluid intake through use of water  As always practice good hygiene, wiping front to back and avoidance of scented vaginal products to prevent further irritation  If symptoms continue to persist after use of medication or recur please follow-up with urgent care or your primary doctor as needed

## 2021-10-06 NOTE — ED Provider Notes (Signed)
[facility]    CSN: [id number] Arrival date & time: 10/06/21  1611      History   Chief Complaint Chief Complaint  Patient presents with   Urinary Tract Infection    HPI Wanda Lang is a 71 y.o. female.   Presents with urinary frequency, urgency vaginal itching and irritation for 3 to 4 days.  Notified her PCP this morning of vaginal itching and irritation and was prophylactically sent Diflucan.  Has not attempted further treatment.  Denies hematuria, dysuria, flank pain, lower abdominal pain and pressure, vaginal discharge. Past Medical History:  Diagnosis Date   Allergy    Arthritis    Chronic kidney disease    Coronary artery disease    GERD (gastroesophageal reflux disease)    Heart murmur    Hypertension    LIVER FUNCTION TESTS, ABNORMAL, HX OF 09/08/2007   Qualifier: Diagnosis of  By: Harlon Ditty CMA (AAMA), Dottie      Patient Active Problem List   Diagnosis Date Noted   Sacroiliac joint disease 08/24/2021   Spinal stenosis, lumbar region, without neurogenic claudication 08/24/2021   S/P total knee arthroplasty, left 07/18/2021   Preop examination 07/07/2021   Right hip pain 07/07/2021   Low back pain 07/07/2021   COVID-19 01/24/2021   Duodenal ulcer 11/12/2019   Stress 11/12/2019   Morbid obesity ([place]) 11/06/2019   Heart murmur 02/06/2018   Chronic diarrhea 10/09/2017   Acid reflux 07/15/2017   Asymptomatic carotid artery stenosis 07/15/2017   Cyst of skin 07/15/2017   Immunocompromised ([place]) 11/09/2016   Allergic rhinitis 11/02/2016   Tremor 11/02/2016   History of hypertension 11/02/2016   Postmenopausal symptoms 07/01/2015   Seasonal allergic rhinitis due to pollen 07/01/2015   Fatty liver 09/08/2007   Rheumatoid arthritis ([place]) 09/08/2007    Past Surgical History:  Procedure Laterality Date   ABDOMINAL HYSTERECTOMY     COLONOSCOPY WITH PROPOFOL N/A 09/04/2018   Procedure: COLONOSCOPY WITH PROPOFOL;  Surgeon: Virgel Manifold, MD;  Location: [facility];  Service: Endoscopy;  Laterality: N/A;   ESOPHAGOGASTRODUODENOSCOPY (EGD) WITH PROPOFOL N/A 09/04/2018   Procedure: ESOPHAGOGASTRODUODENOSCOPY (EGD) WITH PROPOFOL;  Surgeon: Virgel Manifold, MD;  Location: [facility] ENDOSCOPY;  Service: Endoscopy;  Laterality: N/A;   LAPAROSCOPY     TONSILLECTOMY     TOTAL KNEE ARTHROPLASTY Left 07/18/2021   Procedure: TOTAL KNEE ARTHROPLASTY;  Surgeon: Paralee Cancel, MD;  Location: [facility];  Service: Orthopedics;  Laterality: Left;   TURBINATE REDUCTION      OB History   No obstetric history on file.      Home Medications    Prior to Admission medications   Medication Sig Start Date End Date Taking? Authorizing Provider  acetaminophen (TYLENOL) 500 MG tablet Take 1,000 mg by mouth every 6 (six) hours as needed.   Yes [provider]  CALCIUM PO Take 1,200 mg by mouth daily.   Yes [provider]  celecoxib (CELEBREX) 200 MG capsule Take 1 capsule (200 mg total) by mouth 2 (two) times daily. Do not start this medication until you finish the prednisone dose pack. 07/20/21  Yes Irving Copas, PA-C  cycloSPORINE (RESTASIS) 0.05 % ophthalmic emulsion Place 1 drop into both eyes 2 (two) times daily.   Yes [provider]  Docusate Sodium (DSS) 100 MG CAPS Take 1 capsule by mouth 2 (two) times daily. 07/28/21  Yes [provider]  estradiol (VIVELLE-DOT) 0.05 MG/24HR patch Place 1 patch onto the skin 2 (two)  times a week.   Yes [provider]  Estradiol 10 MCG TABS vaginal tablet Place 10 mcg vaginally every Monday, Wednesday, and Friday.   Yes [provider]  Flaxseed, Linseed, (FLAXSEED OIL) 1200 MG CAPS Take 1,200 mg by mouth daily.   Yes [provider]  Glucosamine HCl 1000 MG TABS Take 1,000 mg by mouth daily.   Yes [provider]  hydroxychloroquine (PLAQUENIL) 200 MG tablet Take 200 mg by mouth 2 (two) times daily.   Yes [provider]   lidocaine (LIDODERM) 5 % Place 1 patch onto the skin every 12 (twelve) hours. Remove & Discard patch within 12 hours or as directed by MD 06/12/21 06/12/22 Yes Vladimir Crofts, MD  magnesium gluconate (MAGONATE) 500 MG tablet Take 500 mg by mouth 3 (three) times a week.   Yes [provider]  methocarbamol (ROBAXIN) 500 MG tablet Take 1 tablet by mouth every 6 (six) hours as needed.   Yes [provider]  oxyCODONE (OXY IR/ROXICODONE) 5 MG immediate release tablet Take 1 tablet by mouth every 4 (four) hours as needed. 07/31/21  Yes [provider]  POTASSIUM PO Take 3 tablets by mouth daily.   Yes [provider]  TURMERIC CURCUMIN PO Take 1 capsule by mouth daily.   Yes [provider]  zinc gluconate 50 MG tablet Take 50 mg by mouth 3 (three) times a week.   Yes [provider]  lidocaine 4 % Place 1 patch onto the skin daily as needed (pain).    [provider]  OVER THE COUNTER MEDICATION Take 1 mL by mouth daily. CBD oil    [provider]  polyethylene glycol (MIRALAX / GLYCOLAX) 17 g packet Take 17 g by mouth daily as needed for mild constipation. 07/20/21   Irving Copas, PA-C  predniSONE (DELTASONE) 5 MG tablet  07/20/21   [provider]  pseudoephedrine (SUDAFED) 120 MG 12 hr tablet Take 60-120 mg by mouth daily as needed for congestion.    [provider]  traMADol (ULTRAM) 50 MG tablet     [provider]  TURMERIC CURCUMIN PO Take 1 tablet by mouth daily.    [provider]    Family History Family History  Problem Relation Age of Onset   Alcohol abuse Mother    Arthritis Mother    Hyperlipidemia Mother    Heart disease Mother    Stroke Mother    Hypertension Mother    Depression Mother    Anxiety disorder Mother    Arthritis Father    Lung cancer Paternal Grandfather    Kidney disease Paternal Grandfather     Social History Social History   Tobacco Use   Smoking  status: Never   Smokeless tobacco: Never  Vaping Use   Vaping Use: Never used  Substance Use Topics   Alcohol use: Yes    Alcohol/week: 1.0 standard drink of alcohol    Types: 1 Glasses of wine per week   Drug use: No     Allergies   Penicillins, Amoxicillin, Misc. sulfonamide containing compounds, Other, Rosanil cleanser [sulfacetamide sodium-sulfur], Sulfonamide derivatives, and Ylang-ylang [cananga oil (ylang-ylang)]   Review of Systems Review of Systems  Genitourinary:  Positive for frequency, urgency and vaginal pain. Negative for decreased urine volume, difficulty urinating, dyspareunia, dysuria, enuresis, flank pain, genital sores, hematuria, menstrual problem, pelvic pain, vaginal bleeding and vaginal discharge.     Physical Exam Triage Vital Signs ED Triage Vitals  Enc Vitals Group     BP 10/06/21 1632 122/71     Pulse Rate 10/06/21 1632 78     Resp 10/06/21 1632 16     Temp 10/06/21 1632 98.3 F (36.8 C)     Temp Source 10/06/21 1632 Oral     SpO2 10/06/21 1632 99 %     Weight 10/06/21 1633 200 lb (90.7 kg)     Height 10/06/21 1633 5' 4.5" (1.638 m)     Head Circumference --      Peak Flow --      Pain Score 10/06/21 1633 0     Pain Loc --      Pain Edu? --      Excl. in [place]? --    No data found.  Updated Vital Signs BP 122/71 (BP Location: Right Arm)   Pulse 78   Temp 98.3 F (36.8 C) (Oral)   Resp 16   Ht 5' 4.5" (1.638 m)   Wt 200 lb (90.7 kg)   SpO2 99%   BMI 33.80 kg/m   Visual Acuity Right Eye Distance:   Left Eye Distance:   Bilateral Distance:    Right Eye Near:   Left Eye Near:    Bilateral Near:     Physical Exam Constitutional:      Appearance: Normal appearance.  Eyes:     Extraocular Movements: Extraocular movements intact.  Pulmonary:     Effort: Pulmonary effort is normal.  Abdominal:     General: Abdomen is flat. Bowel sounds are normal. There is no distension.     Palpations: Abdomen is soft.     Tenderness: There is  no abdominal tenderness. There is no right CVA tenderness, left CVA tenderness or guarding.  Skin:    General: Skin is warm and dry.  Neurological:     Mental Status: She is alert and oriented to person, place, and time. Mental status is at baseline.  Psychiatric:        Mood and Affect: Mood normal.        Behavior: Behavior normal.      UC Treatments / Results  Labs (all labs ordered are listed, but only abnormal results are displayed) Labs Reviewed  POCT URINALYSIS DIPSTICK, ED / UC - Abnormal; Notable for the following components:      Result Value   Glucose, UA 100 (*)    Hgb urine dipstick MODERATE (*)    Protein, ur 30 (*)    Nitrite POSITIVE (*)    Leukocytes,Ua TRACE (*)    All other components within normal limits  URINE CULTURE    EKG   Radiology No results found.  Procedures Procedures (including critical care time)  Medications Ordered in UC Medications - No data to display  Initial Impression / Assessment and Plan / UC Course  I have reviewed the triage vital signs and the nursing notes.  Pertinent labs & imaging results that were available during my care of the patient were reviewed by me and considered in my medical decision making (see chart for details).  Acute cystitis without hematuria  Urinalysis showing Karthika Glasper blood cells and nitrates, sent for culture, discussed with patient, Keflex prescribed, recommended over-the-counter Pyridium, increase fluid intake and good hygiene for additional supportive measures, given precautions to follow-up for reevaluation if symptoms persist Final Clinical Impressions(s) / UC Diagnoses   Final diagnoses:  None   Discharge Instructions   None    ED Prescriptions   None  PDMP not reviewed this encounter.   Hans Eden, NP 10/06/21 1825

## 2021-10-09 LAB — URINE CULTURE: Culture: >=100000 — AB

## 2021-10-10 DIAGNOSIS — M5451 Vertebrogenic low back pain: Secondary | ICD-10-CM | POA: Diagnosis not present

## 2021-10-13 DIAGNOSIS — M25562 Pain in left knee: Secondary | ICD-10-CM | POA: Diagnosis not present

## 2021-10-16 ENCOUNTER — Encounter: Payer: Self-pay | Admitting: Family

## 2021-10-16 ENCOUNTER — Ambulatory Visit: Payer: Medicare PPO | Admitting: Family

## 2021-10-16 ENCOUNTER — Telehealth: Payer: Self-pay | Admitting: Family

## 2021-10-16 DIAGNOSIS — L232 Allergic contact dermatitis due to cosmetics: Secondary | ICD-10-CM

## 2021-10-16 DIAGNOSIS — M5416 Radiculopathy, lumbar region: Secondary | ICD-10-CM | POA: Diagnosis not present

## 2021-10-16 DIAGNOSIS — M5136 Other intervertebral disc degeneration, lumbar region: Secondary | ICD-10-CM | POA: Diagnosis not present

## 2021-10-16 DIAGNOSIS — L259 Unspecified contact dermatitis, unspecified cause: Secondary | ICD-10-CM | POA: Insufficient documentation

## 2021-10-16 MED ORDER — PREDNISONE 10 MG PO TABS: ORAL_TABLET | ORAL | 0 refills | Status: DC

## 2021-10-16 NOTE — Telephone Encounter (Signed)
Would you call Wanda Lang in [place] whom she saw this morming  Please let Benedetto Goad know that I sent in prednisone 4 day taper for suspected contact dermatitis.    Im not sure if she had different amount/days in mind for back pain.

## 2021-10-16 NOTE — Progress Notes (Signed)
Subjective:    Patient ID: Wanda Lang, female    DOB: Apr 30, 1950, 71 y.o.   MRN: [id number]  CC: Wanda Lang is a 71 y.o. female who presents today for an acute visit.    HPI: Complains of itchy, painless red rash around hairline, nape of neck x4 days     Rash behind ears.  She had UTI couple of weeks ago and she was treated with keflex , pyridum. No rash at that time. She hasnt been out in the yard.  No lesions in mouth, hands.  Tried hydrocortizone cream one application and suspects some relief at back of hairline.  She has been taking benadryl with temporary relief.   No fever, HA, vision changes, new or unusual joint pain.   Onset after hair cut and questions if related to shampoo. Tried cortisone cream.  Seen today by [facility] who considered prednisone , however deferred treatment until seen by primary care. Concern for shingles.   H/o RA.  Compliant with Plaquenil . Follows with [facility], Dr Dossie Der.    HISTORY:  Past Medical History:  Diagnosis Date   Allergy    Arthritis    Chronic kidney disease    Coronary artery disease    GERD (gastroesophageal reflux disease)    Heart murmur    Hypertension    LIVER FUNCTION TESTS, ABNORMAL, HX OF 09/08/2007   Qualifier: Diagnosis of  By: Harlon Ditty CMA (AAMA), Dottie     Past Surgical History:  Procedure Laterality Date   ABDOMINAL HYSTERECTOMY     COLONOSCOPY WITH PROPOFOL N/A 09/04/2018   Procedure: COLONOSCOPY WITH PROPOFOL;  Surgeon: Virgel Manifold, MD;  Location: [facility];  Service: Endoscopy;  Laterality: N/A;   ESOPHAGOGASTRODUODENOSCOPY (EGD) WITH PROPOFOL N/A 09/04/2018   Procedure: ESOPHAGOGASTRODUODENOSCOPY (EGD) WITH PROPOFOL;  Surgeon: Virgel Manifold, MD;  Location: [facility];  Service: Endoscopy;  Laterality: N/A;   LAPAROSCOPY     TONSILLECTOMY     TOTAL KNEE ARTHROPLASTY Left 07/18/2021   Procedure: TOTAL KNEE ARTHROPLASTY;  Surgeon: Paralee Cancel,  MD;  Location: [facility];  Service: Orthopedics;  Laterality: Left;   TURBINATE REDUCTION     Family History  Problem Relation Age of Onset   Alcohol abuse Mother    Arthritis Mother    Hyperlipidemia Mother    Heart disease Mother    Stroke Mother    Hypertension Mother    Depression Mother    Anxiety disorder Mother    Arthritis Father    Lung cancer Paternal Grandfather    Kidney disease Paternal Grandfather     Allergies: Penicillins, Amoxicillin, Misc. sulfonamide containing compounds, Other, Rosanil cleanser [sulfacetamide sodium-sulfur], Sulfonamide derivatives, and Ylang-ylang [cananga oil (ylang-ylang)] Current Outpatient Medications on File Prior to Visit  Medication Sig Dispense Refill   acetaminophen (TYLENOL) 500 MG tablet Take 1,000 mg by mouth every 6 (six) hours as needed.     CALCIUM PO Take 1,200 mg by mouth daily.     celecoxib (CELEBREX) 200 MG capsule Take 1 capsule (200 mg total) by mouth 2 (two) times daily. Do not start this medication until you finish the prednisone dose pack. 60 capsule 0   cycloSPORINE (RESTASIS) 0.05 % ophthalmic emulsion Place 1 drop into both eyes 2 (two) times daily.     Docusate Sodium (DSS) 100 MG CAPS Take 1 capsule by mouth 2 (two) times daily.     estradiol (VIVELLE-DOT) 0.05 MG/24HR patch Place 1 patch onto the skin 2 (two) times  a week.     Estradiol 10 MCG TABS vaginal tablet Place 10 mcg vaginally every Monday, Wednesday, and Friday.     Flaxseed, Linseed, (FLAXSEED OIL) 1200 MG CAPS Take 1,200 mg by mouth daily.     Glucosamine HCl 1000 MG TABS Take 1,000 mg by mouth daily.     hydroxychloroquine (PLAQUENIL) 200 MG tablet Take 200 mg by mouth 2 (two) times daily.     lidocaine (LIDODERM) 5 % Place 1 patch onto the skin every 12 (twelve) hours. Remove & Discard patch within 12 hours or as directed by MD 10 patch 1   lidocaine 4 % Place 1 patch onto the skin daily as needed (pain).     magnesium gluconate (MAGONATE) 500 MG tablet  Take 500 mg by mouth 3 (three) times a week.     methocarbamol (ROBAXIN) 500 MG tablet Take 1 tablet by mouth every 6 (six) hours as needed.     OVER THE COUNTER MEDICATION Take 1 mL by mouth daily. CBD oil     oxyCODONE (OXY IR/ROXICODONE) 5 MG immediate release tablet Take 1 tablet by mouth every 4 (four) hours as needed.     polyethylene glycol (MIRALAX / GLYCOLAX) 17 g packet Take 17 g by mouth daily as needed for mild constipation. 14 each 0   POTASSIUM PO Take 3 tablets by mouth daily.     pseudoephedrine (SUDAFED) 120 MG 12 hr tablet Take 60-120 mg by mouth daily as needed for congestion.     traMADol (ULTRAM) 50 MG tablet      TURMERIC CURCUMIN PO Take 1 capsule by mouth daily.     TURMERIC CURCUMIN PO Take 1 tablet by mouth daily.     zinc gluconate 50 MG tablet Take 50 mg by mouth 3 (three) times a week.     Current Facility-Administered Medications on File Prior to Visit  Medication Dose Route Frequency Provider Last Rate Last Admin   lidocaine (XYLOCAINE) 1 % (with pres) injection 3 mL  3 mL Infiltration Once Kirsteins, Luanna Salk, MD        Social History   Tobacco Use   Smoking status: Never   Smokeless tobacco: Never  Vaping Use   Vaping Use: Never used  Substance Use Topics   Alcohol use: Yes    Alcohol/week: 1.0 standard drink of alcohol    Types: 1 Glasses of wine per week   Drug use: No    Review of Systems  Constitutional:  Negative for chills and fever.  Respiratory:  Negative for cough.   Cardiovascular:  Negative for chest pain and palpitations.  Gastrointestinal:  Negative for nausea and vomiting.  Skin:  Positive for rash.      Objective:    BP 122/70 (BP Location: Left Arm, Patient Position: Sitting, Cuff Size: Normal)   Pulse 91   Temp 98.7 F (37.1 C) (Oral)   Ht 5' 4.5" (1.638 m)   Wt 203 lb 3.2 oz (92.2 kg)   SpO2 96%   BMI 34.34 kg/m    Physical Exam Vitals reviewed.  Constitutional:      Appearance: She is well-developed.  HENT:      Head:     Comments: No oral lesions Eyes:     Conjunctiva/sclera: Conjunctivae normal.  Cardiovascular:     Rate and Rhythm: Normal rate and regular rhythm.     Pulses: Normal pulses.     Heart sounds: Normal heart sounds.  Pulmonary:     Effort: Pulmonary  effort is normal.     Breath sounds: Normal breath sounds. No wheezing, rhonchi or rales.  Skin:    General: Skin is warm and dry.     Comments: Macula papular lesions noted around nape of neck, hairline across face on erythematous base.  No vesicular lesions red streaks, discharge.  Patient is also seen across chest.  Senile purpura noted bilateral forearms.  Neurological:     Mental Status: She is alert.  Psychiatric:        Speech: Speech normal.        Behavior: Behavior normal.        Thought Content: Thought content normal.           Assessment & Plan:   Problem List Items Addressed This Visit       Musculoskeletal and Integument   Contact dermatitis    Patient well-appearing and nontoxic in appearance. no oral lesions.  Based on presentation and onset, I suspect contact dermatitis most likely from shampoo based on distribution of lesions.  Patient had seen orthopedic today and had considered prednisone taper for ongoing low back pain.  In the absence of signs suggesting infection, I agree that prednisone taper would be most appropriate.   consider Kenalog topical as well.  Called patient this morning 10/18/2021 check on patient.  Rash is unchanged.  No new lesions.  She has taken 1 dose of prednisone.  We agreed to continue prednisone taper.  I placed a referral dermatology in the instance rash does not respond to oral prednisone.  I will see patient 10/20/2021 for re -evaluation.          I have discontinued Aleka Twitty. Devora "Cindy"'s predniSONE. I am also having her maintain her cycloSPORINE, Estradiol, estradiol, hydroxychloroquine, lidocaine, TURMERIC CURCUMIN PO, TURMERIC CURCUMIN PO, OVER THE COUNTER  MEDICATION, Flaxseed Oil, Glucosamine HCl, POTASSIUM PO, pseudoephedrine, lidocaine, CALCIUM PO, zinc gluconate, magnesium gluconate, celecoxib, polyethylene glycol, methocarbamol, traMADol, DSS, oxyCODONE, and acetaminophen. We will continue to administer lidocaine.   No orders of the defined types were placed in this encounter.   Return precautions given.   Risks, benefits, and alternatives of the medications and treatment plan prescribed today were discussed, and patient expressed understanding.   Education regarding symptom management and diagnosis given to patient on AVS.  Continue to follow with Leone Haven, MD for routine health maintenance.   Simeon Craft and I agreed with plan.   Mable Paris, FNP

## 2021-10-18 ENCOUNTER — Telehealth: Payer: Self-pay | Admitting: Family

## 2021-10-18 ENCOUNTER — Encounter: Payer: Self-pay | Admitting: Family

## 2021-10-18 ENCOUNTER — Telehealth: Payer: Self-pay

## 2021-10-18 DIAGNOSIS — L232 Allergic contact dermatitis due to cosmetics: Secondary | ICD-10-CM

## 2021-10-18 NOTE — Telephone Encounter (Signed)
Wanda Lang, Patient has a rash is not responding to prednisone.  I have placed an urgent referral.  We will go ahead and send this to dermatology and see how quickly we can get  her in    jenate, please see secure chat and schedule patient in the 12:00 slot on Friday for rash reevaluation

## 2021-10-18 NOTE — Telephone Encounter (Signed)
Patient is scheduled for 12 pm on 10/20/21

## 2021-10-18 NOTE — Telephone Encounter (Signed)
LVM to call back to office to inform patient that I added her to [facility] schedule for Friday 10/20/21 at 12.

## 2021-10-18 NOTE — Assessment & Plan Note (Addendum)
Patient well-appearing and nontoxic in appearance. no oral lesions.  Based on presentation and onset, I suspect contact dermatitis most likely from shampoo based on distribution of lesions. Keflex a couple of weeks ago for UTI.  Patient had seen orthopedic APP today whom had considered prednisone taper for ongoing low back pain.  In the absence of signs suggesting infection, I agree that prednisone taper would be most appropriate.   consider Kenalog topical as well.  Called patient this morning 10/18/2021 check on patient.  Rash is unchanged.  No new lesions.  She has taken 1 dose of prednisone.  We agreed to continue prednisone taper.  I placed a referral dermatology in the instance rash does not respond to oral prednisone.  I will see patient 10/20/2021 for re -evaluation.

## 2021-10-19 ENCOUNTER — Ambulatory Visit: Payer: Medicare PPO | Admitting: Dermatology

## 2021-10-19 DIAGNOSIS — L253 Unspecified contact dermatitis due to other chemical products: Secondary | ICD-10-CM

## 2021-10-19 DIAGNOSIS — L740 Miliaria rubra: Secondary | ICD-10-CM

## 2021-10-19 MED ORDER — MOMETASONE FUROATE 0.1 % EX CREA: TOPICAL_CREAM | CUTANEOUS | 1 refills | Status: DC

## 2021-10-19 MED ORDER — MOMETASONE FUROATE 0.1 % EX SOLN: CUTANEOUS | 1 refills | Status: DC

## 2021-10-19 NOTE — Progress Notes (Unsigned)
New Patient Visit  Subjective  Wanda Lang is a 71 y.o. female who presents for the following: New Patient (Initial Visit) (Concerned about itchy rash at scalp, neck, and behind ears that started a week ago after she went to salon for hair cut. Patient reports she broke out in itchy rash at scalp and spread. She was seen by pcp and prescribed prednisone she is currently on day 3. She has seen some improvement but still wanted to get checked. She also reports red bumps at abdomen. ).  The patient has spots, moles and lesions to be evaluated, some may be new or changing and the patient has concerns that these could be cancer.  The following portions of the chart were reviewed this encounter and updated as appropriate:   Tobacco  Allergies  Meds  Problems  Med Hx  Surg Hx  Fam Hx     Review of Systems:  No other skin or systemic complaints except as noted in HPI or Assessment and Plan.  Objective  Well appearing patient in no apparent distress; mood and affect are within normal limits.  A focused examination was performed including b/l posterior ears, scalp, abdomen, neck. Relevant physical exam findings are noted in the Assessment and Plan.  abdomen Pink spotty macules at abdomen    Assessment & Plan  Contact dermatitis due to other chemical product, unspecified contact dermatitis type Scalp, neck, posterior b/l ears  Hx of itchy rash at scalp, neck, behind ears. Patient believes what salon used to wash hair may have caused. Recommend to request they use different product for future visits.   Recommend finishing prednisone as prescribed  Start Mometasone 0.1 % cream - apply topically to aa's behind ears and neck twice daily. Use for up to 2 weeks when flared. If no longer flared d/c   Start mometasone 0.1 % lotion - apply topically to scaly daily for itchy rash. Use for up to 2 weeks if flared. If no longer flared can d/c.   Topical steroids (such as triamcinolone,  fluocinolone, fluocinonide, mometasone, clobetasol, halobetasol, betamethasone, hydrocortisone) can cause thinning and lightening of the skin if they are used for too long in the same area. Your physician has selected the right strength medicine for your problem and area affected on the body. Please use your medication only as directed by your physician to prevent side effects.   mometasone (ELOCON) 0.1 % cream - Scalp, neck, posterior b/l ears Apply topically to affected areas behind ears and neck twice daily for itchy rash use for up to 2 weeks. Can also use at itchy areas at abdomen. mometasone (ELOCON) 0.1 % lotion - Scalp, neck, posterior b/l ears Apply topically to scalp daily for itchy spots use for up to 2 weeks. If no longer itchy can d/c  Miliaria rubra Associated with sweating and occlusion from her underwear abdomen No cure Benign Can use mometasone 0.1 % cream to affected areas when itchy prn.   Topical steroids (such as triamcinolone, fluocinolone, fluocinonide, mometasone, clobetasol, halobetasol, betamethasone, hydrocortisone) can cause thinning and lightening of the skin if they are used for too long in the same area. Your physician has selected the right strength medicine for your problem and area affected on the body. Please use your medication only as directed by your physician to prevent side effects.   Return for tbse . Garry Heater, CMA, am acting as scribe for Sarina Ser, MD. Documentation: I have reviewed the above documentation for accuracy and  completeness, and I agree with the above.  Sarina Ser, MD

## 2021-10-19 NOTE — Telephone Encounter (Signed)
Thanks Rasheedah! 

## 2021-10-19 NOTE — Patient Instructions (Signed)
For itchy rash at neck and behind ears  Start Mometasone cream - apply topically twice daily to affected areas. Can use for up to 2 weeks. Can also use on itchy areas of abdomen. If no longer flared with rash discontinue  Finish Prednisone prescription  For itchy rash at scalp  Start mometasone solution - apply topically to affected areas of scalp once daily. Can use for up to 2 weeks. If clear discontinue   Topical steroids (such as triamcinolone, fluocinolone, fluocinonide, mometasone, clobetasol, halobetasol, betamethasone, hydrocortisone) can cause thinning and lightening of the skin if they are used for too long in the same area. Your physician has selected the right strength medicine for your problem and area affected on the body. Please use your medication only as directed by your physician to prevent side effects.   Due to recent changes in healthcare laws, you may see results of your pathology and/or laboratory studies on MyChart before the doctors have had a chance to review them. We understand that in some cases there may be results that are confusing or concerning to you. Please understand that not all results are received at the same time and often the doctors may need to interpret multiple results in order to provide you with the best plan of care or course of treatment. Therefore, we ask that you please give [place] 2 business days to thoroughly review all your results before contacting the office for clarification. Should we see a critical lab result, you will be contacted sooner.   If You Need Anything After Your Visit  If you have any questions or concerns for your doctor, please call our main line at [phone] and press option 4 to reach your doctor's medical assistant. If no one answers, please leave a voicemail as directed and we will return your call as soon as possible. Messages left after 4 pm will be answered the following business day.   You may also send [place] a message via  [place]. We typically respond to MyChart messages within 1-2 business days.  For prescription refills, please ask your pharmacy to contact our office. Our fax number is [phone].  If you have an urgent issue when the clinic is closed that cannot wait until the next business day, you can page your doctor at the number below.    Please note that while we do our best to be available for urgent issues outside of office hours, we are not available 24/7.   If you have an urgent issue and are unable to reach [place], you may choose to seek medical care at your doctor's office, retail clinic, urgent care center, or emergency room.  If you have a medical emergency, please immediately call 911 or go to the emergency department.  Pager Numbers  - Dr. Nehemiah Massed: [phone]  - Dr. Laurence Ferrari: [phone]  - Dr. Nicole Kindred: [phone]  In the event of inclement weather, please call our main line at [phone] for an update on the status of any delays or closures.  Dermatology Medication Tips: Please keep the boxes that topical medications come in in order to help keep track of the instructions about where and how to use these. Pharmacies typically print the medication instructions only on the boxes and not directly on the medication tubes.   If your medication is too expensive, please contact our office at [phone] option 4 or send [place] a message through [place].   We are unable to tell what your co-pay for medications will be in  advance as this is different depending on your insurance coverage. However, we may be able to find a substitute medication at lower cost or fill out paperwork to get insurance to cover a needed medication.   If a prior authorization is required to get your medication covered by your insurance company, please allow [place] 1-2 business days to complete this process.  Drug prices often vary depending on where the prescription is filled and some pharmacies may offer cheaper  prices.  The website www.goodrx.com contains coupons for medications through different pharmacies. The prices here do not account for what the cost may be with help from insurance (it may be cheaper with your insurance), but the website can give you the price if you did not use any insurance.  - You can print the associated coupon and take it with your prescription to the pharmacy.  - You may also stop by our office during regular business hours and pick up a GoodRx coupon card.  - If you need your prescription sent electronically to a different pharmacy, notify our office through [facility] or by phone at [phone] option 4.     Si Usted Necesita Algo Despus de Su Visita  Tambin puede enviarnos un mensaje a travs de Pharmacist, community. Por lo general respondemos a los mensajes de MyChart en el transcurso de 1 a 2 das hbiles.  Para renovar recetas, por favor pida a su farmacia que se ponga en contacto con nuestra oficina. Harland Dingwall de fax es [place] [phone].  Si tiene un asunto urgente cuando la clnica est cerrada y que no puede esperar hasta el siguiente da hbil, puede llamar/localizar a su doctor(a) al nmero que aparece a continuacin.   Por favor, tenga en cuenta que aunque hacemos todo lo posible para estar disponibles para asuntos urgentes fuera del horario de [place], no estamos disponibles las 24 horas del da, los 7 das de la [place].   Si tiene un problema urgente y no puede comunicarse con nosotros, puede optar por buscar atencin mdica  en el consultorio de su doctor(a), en una clnica privada, en un centro de atencin urgente o en una sala de emergencias.  Si tiene Engineering geologist, por favor llame inmediatamente al 911 o vaya a la sala de emergencias.  Nmeros de bper  - Dr. Nehemiah Massed: [phone]  - Dra. Moye: [phone]  - Dra. Nicole Kindred: [phone]  En caso de inclemencias del [place], por favor llame a Johnsie Kindred principal al [phone]  para una actualizacin sobre el [place] de cualquier retraso o cierre.  Consejos para la medicacin en dermatologa: Por favor, guarde las cajas en las que vienen los medicamentos de uso tpico para ayudarle a seguir las instrucciones sobre dnde y cmo usarlos. Las farmacias generalmente imprimen las instrucciones del medicamento slo en las cajas y no directamente en los tubos del [place].   Si su medicamento es muy caro, por favor, pngase en contacto con Zigmund Daniel llamando al [phone] y presione la opcin 4 o envenos un mensaje a travs de Pharmacist, community.   No podemos decirle cul ser su copago por los medicamentos por adelantado ya que esto es diferente dependiendo de la cobertura de su seguro. Sin embargo, es posible que podamos encontrar un medicamento sustituto a Electrical engineer un formulario para que el seguro cubra el medicamento que se considera necesario.   Si se requiere una autorizacin previa para que su compaa de seguros [place] su medicamento, por favor permtanos de 1 a 2  das hbiles para completar [place].  Los precios de los medicamentos varan con frecuencia dependiendo del Environmental consultant de dnde se surte la receta y alguna farmacias pueden ofrecer precios ms baratos.  El sitio web www.goodrx.com tiene cupones para medicamentos de Airline pilot. Los precios aqu no tienen en cuenta lo que podra costar con la ayuda del seguro (puede ser ms barato con su seguro), pero el sitio web puede darle el precio si no utiliz Research scientist (physical sciences).  - Puede imprimir el cupn correspondiente y llevarlo con su receta a la farmacia.  - Tambin puede pasar por nuestra oficina durante el horario de atencin regular y Charity fundraiser una tarjeta de cupones de GoodRx.  - Si necesita que su receta se enve electrnicamente a una farmacia diferente, informe a nuestra oficina a travs de MyChart de [place] o por telfono llamando al [phone] y presione la opcin 4.

## 2021-10-19 NOTE — Telephone Encounter (Signed)
Called [facility] in [place] and spoke to [place] and she informed me that the patient  had been prescribed a 10 mg 10 day taper over 6 days for her back pain. (21 tablets) in all

## 2021-10-20 ENCOUNTER — Ambulatory Visit: Payer: Medicare PPO | Admitting: Family

## 2021-10-20 NOTE — Telephone Encounter (Signed)
noted 

## 2021-10-20 NOTE — Telephone Encounter (Signed)
Pt called stating the prednisone has helped the rash some and the dermatologist has gave her cream to put on it.

## 2021-10-21 DIAGNOSIS — M5136 Other intervertebral disc degeneration, lumbar region: Secondary | ICD-10-CM | POA: Diagnosis not present

## 2021-10-21 DIAGNOSIS — M5416 Radiculopathy, lumbar region: Secondary | ICD-10-CM | POA: Diagnosis not present

## 2021-10-22 ENCOUNTER — Other Ambulatory Visit: Payer: Self-pay

## 2021-10-22 ENCOUNTER — Encounter: Payer: Self-pay | Admitting: Dermatology

## 2021-10-22 DIAGNOSIS — Z96652 Presence of left artificial knee joint: Secondary | ICD-10-CM | POA: Insufficient documentation

## 2021-10-22 DIAGNOSIS — M6281 Muscle weakness (generalized): Secondary | ICD-10-CM | POA: Diagnosis not present

## 2021-10-22 DIAGNOSIS — M461 Sacroiliitis, not elsewhere classified: Secondary | ICD-10-CM | POA: Diagnosis not present

## 2021-10-22 DIAGNOSIS — I2510 Atherosclerotic heart disease of native coronary artery without angina pectoris: Secondary | ICD-10-CM | POA: Insufficient documentation

## 2021-10-22 DIAGNOSIS — M5430 Sciatica, unspecified side: Secondary | ICD-10-CM | POA: Diagnosis present

## 2021-10-22 DIAGNOSIS — R2689 Other abnormalities of gait and mobility: Secondary | ICD-10-CM | POA: Diagnosis not present

## 2021-10-22 DIAGNOSIS — M5459 Other low back pain: Secondary | ICD-10-CM | POA: Diagnosis not present

## 2021-10-22 DIAGNOSIS — Z6834 Body mass index (BMI) 34.0-34.9, adult: Secondary | ICD-10-CM | POA: Diagnosis not present

## 2021-10-22 DIAGNOSIS — N189 Chronic kidney disease, unspecified: Secondary | ICD-10-CM | POA: Insufficient documentation

## 2021-10-22 DIAGNOSIS — M069 Rheumatoid arthritis, unspecified: Secondary | ICD-10-CM | POA: Insufficient documentation

## 2021-10-22 DIAGNOSIS — Z79899 Other long term (current) drug therapy: Secondary | ICD-10-CM | POA: Diagnosis not present

## 2021-10-22 DIAGNOSIS — M549 Dorsalgia, unspecified: Secondary | ICD-10-CM | POA: Diagnosis not present

## 2021-10-22 DIAGNOSIS — M48061 Spinal stenosis, lumbar region without neurogenic claudication: Secondary | ICD-10-CM | POA: Diagnosis not present

## 2021-10-22 DIAGNOSIS — E669 Obesity, unspecified: Secondary | ICD-10-CM | POA: Insufficient documentation

## 2021-10-22 DIAGNOSIS — I129 Hypertensive chronic kidney disease with stage 1 through stage 4 chronic kidney disease, or unspecified chronic kidney disease: Secondary | ICD-10-CM | POA: Diagnosis not present

## 2021-10-22 DIAGNOSIS — M4126 Other idiopathic scoliosis, lumbar region: Secondary | ICD-10-CM | POA: Diagnosis not present

## 2021-10-22 DIAGNOSIS — M47817 Spondylosis without myelopathy or radiculopathy, lumbosacral region: Secondary | ICD-10-CM | POA: Diagnosis not present

## 2021-10-22 MED ORDER — ONDANSETRON 4 MG PO TBDP
4.0000 mg | ORAL_TABLET | Freq: Once | ORAL | Status: AC
  Administered 2021-10-23: 4 mg via ORAL
  Filled 2021-10-22: qty 1

## 2021-10-22 MED ORDER — HYDROCODONE-ACETAMINOPHEN 5-325 MG PO TABS
1.0000 | ORAL_TABLET | Freq: Once | ORAL | Status: AC
  Administered 2021-10-23: 1 via ORAL
  Filled 2021-10-22: qty 1

## 2021-10-22 NOTE — ED Notes (Signed)
Pt arrives from home pe rems with chronic back pain for years, per ems pt received a lidocaine patch, per ems pain is going down right leg, vss per ems.

## 2021-10-22 NOTE — ED Triage Notes (Signed)
Patient reports history of lower back pain but states since this summer symptoms have worsened and has begun getting injections. States she is to get injection Wednesday but pain is too severe to wait until then. Denies numbness or incontinence. Denies new injuries. AOX4. Resp even, unlabored on RA.

## 2021-10-23 ENCOUNTER — Observation Stay
Admission: EM | Admit: 2021-10-23 | Discharge: 2021-10-26 | Disposition: A | Discharge status: Skilled Nursing Facility | Payer: Medicare PPO | Attending: Internal Medicine | Admitting: Internal Medicine

## 2021-10-23 ENCOUNTER — Encounter: Payer: Self-pay | Admitting: Radiology

## 2021-10-23 ENCOUNTER — Emergency Department: Payer: Medicare PPO

## 2021-10-23 DIAGNOSIS — M48061 Spinal stenosis, lumbar region without neurogenic claudication: Secondary | ICD-10-CM | POA: Diagnosis present

## 2021-10-23 DIAGNOSIS — M461 Sacroiliitis, not elsewhere classified: Secondary | ICD-10-CM

## 2021-10-23 DIAGNOSIS — M545 Low back pain, unspecified: Secondary | ICD-10-CM

## 2021-10-23 DIAGNOSIS — R2689 Other abnormalities of gait and mobility: Secondary | ICD-10-CM

## 2021-10-23 DIAGNOSIS — M47818 Spondylosis without myelopathy or radiculopathy, sacral and sacrococcygeal region: Secondary | ICD-10-CM

## 2021-10-23 DIAGNOSIS — R262 Difficulty in walking, not elsewhere classified: Secondary | ICD-10-CM | POA: Diagnosis not present

## 2021-10-23 DIAGNOSIS — R52 Pain, unspecified: Secondary | ICD-10-CM

## 2021-10-23 DIAGNOSIS — M4126 Other idiopathic scoliosis, lumbar region: Secondary | ICD-10-CM | POA: Diagnosis not present

## 2021-10-23 LAB — BASIC METABOLIC PANEL
Anion gap: 9 (ref 5–15)
BUN: 26 mg/dL — ABNORMAL HIGH (ref 8–23)
CO2: 27 mmol/L (ref 22–32)
Calcium: 9.2 mg/dL (ref 8.9–10.3)
Chloride: 106 mmol/L (ref 98–111)
Creatinine, Ser: 0.82 mg/dL (ref 0.44–1.00)
GFR, Estimated: >60 mL/min (ref >60)
Glucose, Bld: 99 mg/dL (ref 70–99)
Potassium: 4 mmol/L (ref 3.5–5.1)
Sodium: 142 mmol/L (ref 135–145)

## 2021-10-23 LAB — CBC WITH DIFFERENTIAL/PLATELET
Abs Immature Granulocytes: 0.03 10*3/uL (ref 0.00–0.07)
Basophils Absolute: 0.1 10*3/uL (ref 0.0–0.1)
Basophils Relative: 1 %
Eosinophils Absolute: 0.3 10*3/uL (ref 0.0–0.5)
Eosinophils Relative: 4 %
HCT: 41.1 % (ref 36.0–46.0)
Hemoglobin: 13.2 g/dL (ref 12.0–15.0)
Immature Granulocytes: 0 %
Lymphocytes Relative: 32 %
Lymphs Abs: 2.3 10*3/uL (ref 0.7–4.0)
MCH: 31.7 pg (ref 26.0–34.0)
MCHC: 32.1 g/dL (ref 30.0–36.0)
MCV: 98.6 fL (ref 80.0–100.0)
Monocytes Absolute: 0.6 10*3/uL (ref 0.1–1.0)
Monocytes Relative: 8 %
Neutro Abs: 4.1 10*3/uL (ref 1.7–7.7)
Neutrophils Relative %: 55 %
Platelets: 151 10*3/uL (ref 150–400)
RBC: 4.17 MIL/uL (ref 3.87–5.11)
RDW: 12.2 % (ref 11.5–15.5)
WBC: 7.4 10*3/uL (ref 4.0–10.5)
nRBC: 0 % (ref 0.0–0.2)

## 2021-10-23 LAB — URINALYSIS, ROUTINE W REFLEX MICROSCOPIC
Glucose, UA: NEGATIVE mg/dL
Hgb urine dipstick: NEGATIVE
Ketones, ur: 5 mg/dL — AB
Nitrite: NEGATIVE
Protein, ur: 100 mg/dL — AB
Specific Gravity, Urine: >1.046 — ABNORMAL HIGH (ref 1.005–1.030)
pH: 5 (ref 5.0–8.0)

## 2021-10-23 LAB — C-REACTIVE PROTEIN: CRP: 0.9 mg/dL (ref <1.0)

## 2021-10-23 LAB — SEDIMENTATION RATE: Sed Rate: 12 mm/hr (ref 0–30)

## 2021-10-23 LAB — HIV ANTIBODY (ROUTINE TESTING W REFLEX): HIV Screen 4th Generation wRfx: NONREACTIVE

## 2021-10-23 MED ORDER — ACETAMINOPHEN 500 MG PO TABS
1000.0000 mg | ORAL_TABLET | Freq: Four times a day (QID) | ORAL | Status: DC | PRN
  Administered 2021-10-23 – 2021-10-24 (×3): 1000 mg via ORAL
  Filled 2021-10-23 (×3): qty 2

## 2021-10-23 MED ORDER — ONDANSETRON HCL 4 MG/2ML IJ SOLN
4.0000 mg | Freq: Once | INTRAMUSCULAR | Status: AC
  Administered 2021-10-23: 4 mg via INTRAVENOUS
  Filled 2021-10-23: qty 2

## 2021-10-23 MED ORDER — POLYETHYLENE GLYCOL 3350 17 G PO PACK
17.0000 g | PACK | Freq: Every day | ORAL | Status: DC | PRN
  Administered 2021-10-24 – 2021-10-25 (×2): 17 g via ORAL
  Filled 2021-10-23 (×2): qty 1

## 2021-10-23 MED ORDER — DOCUSATE SODIUM 100 MG PO CAPS
100.0000 mg | ORAL_CAPSULE | Freq: Two times a day (BID) | ORAL | Status: DC
  Administered 2021-10-23 – 2021-10-26 (×6): 100 mg via ORAL
  Filled 2021-10-23 (×7): qty 1

## 2021-10-23 MED ORDER — MORPHINE SULFATE (PF) 4 MG/ML IV SOLN
4.0000 mg | Freq: Once | INTRAVENOUS | Status: AC
  Administered 2021-10-23: 4 mg via INTRAVENOUS
  Filled 2021-10-23: qty 1

## 2021-10-23 MED ORDER — METHOCARBAMOL 500 MG PO TABS
500.0000 mg | ORAL_TABLET | Freq: Four times a day (QID) | ORAL | Status: DC | PRN
  Administered 2021-10-24 (×2): 500 mg via ORAL
  Filled 2021-10-23 (×2): qty 1

## 2021-10-23 MED ORDER — LIDOCAINE 5 % EX PTCH
1.0000 | MEDICATED_PATCH | Freq: Two times a day (BID) | CUTANEOUS | Status: DC
  Administered 2021-10-23 – 2021-10-26 (×7): 1 via TRANSDERMAL
  Filled 2021-10-23 (×7): qty 1

## 2021-10-23 MED ORDER — ENOXAPARIN SODIUM 60 MG/0.6ML IJ SOSY
0.5000 mg/kg | PREFILLED_SYRINGE | INTRAMUSCULAR | Status: DC
  Administered 2021-10-23 – 2021-10-25 (×3): 45 mg via SUBCUTANEOUS
  Filled 2021-10-23 (×3): qty 0.6

## 2021-10-23 MED ORDER — DEXAMETHASONE SODIUM PHOSPHATE 10 MG/ML IJ SOLN
10.0000 mg | Freq: Once | INTRAMUSCULAR | Status: AC
  Administered 2021-10-23: 10 mg via INTRAVENOUS
  Filled 2021-10-23: qty 1

## 2021-10-23 MED ORDER — CYCLOSPORINE 0.05 % OP EMUL
1.0000 [drp] | Freq: Two times a day (BID) | OPHTHALMIC | Status: DC
  Administered 2021-10-23 – 2021-10-25 (×6): 1 [drp] via OPHTHALMIC
  Filled 2021-10-23 (×8): qty 30

## 2021-10-23 MED ORDER — FLAXSEED OIL 1200 MG PO CAPS: 1200.0000 mg | ORAL_CAPSULE | Freq: Every day | ORAL | Status: DC

## 2021-10-23 MED ORDER — MAGNESIUM OXIDE -MG SUPPLEMENT 400 (240 MG) MG PO TABS
400.0000 mg | ORAL_TABLET | ORAL | Status: DC
  Administered 2021-10-23 – 2021-10-25 (×2): 400 mg via ORAL
  Filled 2021-10-23 (×3): qty 1

## 2021-10-23 MED ORDER — CALCIUM 500 MG PO TABS: 1200.0000 mg | ORAL_TABLET | Freq: Every day | ORAL | Status: DC

## 2021-10-23 MED ORDER — GADOBUTROL 1 MMOL/ML IV SOLN
9.0000 mL | Freq: Once | INTRAVENOUS | Status: AC | PRN
  Administered 2021-10-23: 9 mL via INTRAVENOUS

## 2021-10-23 MED ORDER — HYDROXYCHLOROQUINE SULFATE 200 MG PO TABS
200.0000 mg | ORAL_TABLET | Freq: Two times a day (BID) | ORAL | Status: DC
  Administered 2021-10-23 – 2021-10-26 (×7): 200 mg via ORAL
  Filled 2021-10-23 (×8): qty 1

## 2021-10-23 MED ORDER — OXYCODONE HCL 5 MG PO TABS
5.0000 mg | ORAL_TABLET | ORAL | Status: DC | PRN
  Administered 2021-10-23 – 2021-10-26 (×10): 5 mg via ORAL
  Filled 2021-10-23 (×10): qty 1

## 2021-10-23 MED ORDER — CELECOXIB 200 MG PO CAPS: 200.0000 mg | ORAL_CAPSULE | Freq: Two times a day (BID) | ORAL | Status: DC

## 2021-10-23 MED ORDER — MOMETASONE FUROATE 0.1 % EX CREA: 1.0000 | TOPICAL_CREAM | Freq: Two times a day (BID) | CUTANEOUS | Status: DC

## 2021-10-23 NOTE — ED Provider Notes (Signed)
[facility] Provider Note    Event Date/Time   First MD Initiated Contact with Patient 10/23/21 [phone]     (approximate)   History   Back Pain   HPI  Wanda Lang is a 71 y.o. female with history of hypertension, chronic kidney disease, CAD who presents to the emergency department with lower back pain that radiates down her right leg.  She has had intermittent symptoms since May when she thinks she tweaked her back making a bed.  States pain has been on and off since that time.  Denies any new injury.  She has seen Dr. Nelva Bush with [facility] as an outpatient and has had epidural injection about 6 weeks ago.  No history of IV drug abuse, cancer, HIV, diabetes.  No fever, chills.  She denies any bowel or bladder incontinence, numbness, tingling or weakness but states her pain is so severe that she is unable to bear weight on her right leg despite taking Percocet at home.  She uses a walker at baseline.  She feels she needs admission to the hospital for physical therapy, pain control and rehab placement.   History provided by patient.    Past Medical History:  Diagnosis Date   Allergy    Arthritis    Chronic kidney disease    Coronary artery disease    GERD (gastroesophageal reflux disease)    Heart murmur    Hypertension    LIVER FUNCTION TESTS, ABNORMAL, HX OF 09/08/2007   Qualifier: Diagnosis of  By: Harlon Ditty CMA (AAMA), Dottie      Past Surgical History:  Procedure Laterality Date   ABDOMINAL HYSTERECTOMY     COLONOSCOPY WITH PROPOFOL N/A 09/04/2018   Procedure: COLONOSCOPY WITH PROPOFOL;  Surgeon: Virgel Manifold, MD;  Location: [facility] ENDOSCOPY;  Service: Endoscopy;  Laterality: N/A;   ESOPHAGOGASTRODUODENOSCOPY (EGD) WITH PROPOFOL N/A 09/04/2018   Procedure: ESOPHAGOGASTRODUODENOSCOPY (EGD) WITH PROPOFOL;  Surgeon: Virgel Manifold, MD;  Location: [facility] ENDOSCOPY;  Service: Endoscopy;  Laterality: N/A;   LAPAROSCOPY     TONSILLECTOMY      TOTAL KNEE ARTHROPLASTY Left 07/18/2021   Procedure: TOTAL KNEE ARTHROPLASTY;  Surgeon: Paralee Cancel, MD;  Location: [facility]S;  Service: Orthopedics;  Laterality: Left;   TURBINATE REDUCTION      MEDICATIONS:  Prior to Admission medications   Medication Sig Start Date End Date Taking? Authorizing Provider  acetaminophen (TYLENOL) 500 MG tablet Take 1,000 mg by mouth every 6 (six) hours as needed.    [provider]  CALCIUM PO Take 1,200 mg by mouth daily.    [provider]  celecoxib (CELEBREX) 200 MG capsule Take 1 capsule (200 mg total) by mouth 2 (two) times daily. Do not start this medication until you finish the prednisone dose pack. 07/20/21   Irving Copas, PA-C  cycloSPORINE (RESTASIS) 0.05 % ophthalmic emulsion Place 1 drop into both eyes 2 (two) times daily.    [provider]  Docusate Sodium (DSS) 100 MG CAPS Take 1 capsule by mouth 2 (two) times daily. 07/28/21   [provider]  estradiol (VIVELLE-DOT) 0.05 MG/24HR patch Place 1 patch onto the skin 2 (two) times a week.    [provider]  Estradiol 10 MCG TABS vaginal tablet Place 10 mcg vaginally every Monday, Wednesday, and Friday.    [provider]  Flaxseed, Linseed, (FLAXSEED OIL) 1200 MG CAPS Take 1,200 mg by mouth daily.    [provider]  Glucosamine HCl 1000  MG TABS Take 1,000 mg by mouth daily.    [provider]  hydroxychloroquine (PLAQUENIL) 200 MG tablet Take 200 mg by mouth 2 (two) times daily.    [provider]  lidocaine (LIDODERM) 5 % Place 1 patch onto the skin every 12 (twelve) hours. Remove & Discard patch within 12 hours or as directed by MD 06/12/21 06/12/22  Vladimir Crofts, MD  lidocaine 4 % Place 1 patch onto the skin daily as needed (pain).    [provider]  magnesium gluconate (MAGONATE) 500 MG tablet Take 500 mg by mouth 3 (three) times a week.    [provider]  methocarbamol (ROBAXIN) 500 MG  tablet Take 1 tablet by mouth every 6 (six) hours as needed.    [provider]  mometasone (ELOCON) 0.1 % cream Apply topically to affected areas behind ears and neck twice daily for itchy rash use for up to 2 weeks. Can also use at itchy areas at abdomen. 10/19/21   Ralene Bathe, MD  mometasone (ELOCON) 0.1 % lotion Apply topically to scalp daily for itchy spots use for up to 2 weeks. If no longer itchy can d/c 10/19/21   Ralene Bathe, MD  OVER THE COUNTER MEDICATION Take 1 mL by mouth daily. CBD oil    [provider]  oxyCODONE (OXY IR/ROXICODONE) 5 MG immediate release tablet Take 1 tablet by mouth every 4 (four) hours as needed. 07/31/21   [provider]  polyethylene glycol (MIRALAX / GLYCOLAX) 17 g packet Take 17 g by mouth daily as needed for mild constipation. 07/20/21   Irving Copas, PA-C  POTASSIUM PO Take 3 tablets by mouth daily.    [provider]  predniSONE (DELTASONE) 10 MG tablet Take 40 mg by mouth on day 1, then taper 10 mg daily until gone 10/16/21   Burnard Hawthorne, FNP  pseudoephedrine (SUDAFED) 120 MG 12 hr tablet Take 60-120 mg by mouth daily as needed for congestion.    [provider]  traMADol (ULTRAM) 50 MG tablet     [provider]  TURMERIC CURCUMIN PO Take 1 capsule by mouth daily.    [provider]  TURMERIC CURCUMIN PO Take 1 tablet by mouth daily.    [provider]  zinc gluconate 50 MG tablet Take 50 mg by mouth 3 (three) times a week.    [provider]    Physical Exam   Triage Vital Signs: ED Triage Vitals [10/22/21 2319]  Enc Vitals Group     BP (!) 119/96     Pulse Rate 79     Resp 18     Temp 98.4 F (36.9 C)     Temp Source Oral     SpO2 99 %     Weight 200 lb (90.7 kg)     Height '5\' 4"'$  (1.626 m)     Head Circumference      Peak Flow      Pain Score 9     Pain Loc      Pain Edu?      Excl. in [place]?     Most recent vital signs: Vitals:    10/23/21 0538 10/23/21 0600  BP:  130/68  Pulse:  87  Resp:  18  Temp: 98.7 F (37.1 C)   SpO2:  94%    CONSTITUTIONAL: Alert and oriented and responds appropriately to questions.  Elderly, appears uncomfortable. HEAD: Normocephalic, atraumatic EYES: Conjunctivae clear, pupils appear  equal, sclera nonicteric ENT: normal nose; moist mucous membranes NECK: Supple, normal ROM CARD: RRR; S1 and S2 appreciated; no murmurs, no clicks, no rubs, no gallops RESP: Normal chest excursion without splinting or tachypnea; breath sounds clear and equal bilaterally; no wheezes, no rhonchi, no rales, no hypoxia or respiratory distress, speaking full sentences ABD/GI: Normal bowel sounds; non-distended; soft, non-tender, no rebound, no guarding, no peritoneal signs BACK: The back appears normal, tender over the lumbar spine without step-off or deformity, redness or warmth, soft tissue swelling, ecchymosis, rash or other lesions. EXT: Normal ROM in all joints; no deformity noted, no edema; no cyanosis SKIN: Normal color for age and race; warm; no rash on exposed skin NEURO: Moves all extremities equally, normal speech, normal sensation, positive straight leg raise on the right.  No saddle anesthesia.  No hyperreflexia.  Gait testing deferred due to patient's level of pain. PSYCH: The patient's mood and manner are appropriate.   ED Results / Procedures / Treatments   LABS: (all labs ordered are listed, but only abnormal results are displayed) Labs Reviewed  URINALYSIS, ROUTINE W REFLEX MICROSCOPIC - Abnormal; Notable for the following components:      Result Value   Color, Urine AMBER (*)    APPearance CLOUDY (*)    Specific Gravity, Urine >1.046 (*)    Bilirubin Urine MODERATE (*)    Ketones, ur 5 (*)    Protein, ur 100 (*)    Leukocytes,Ua TRACE (*)    Bacteria, UA RARE (*)    All other components within normal limits  BASIC METABOLIC PANEL - Abnormal; Notable for the following components:    BUN 26 (*)    All other components within normal limits  CBC WITH DIFFERENTIAL/PLATELET     EKG:   RADIOLOGY: My personal review and interpretation of imaging: Right shows degenerative changes and mild to moderate spinal stenosis but no cauda equina, severe stenosis, epidural abscess or hematoma, infectious etiology.  I have personally reviewed all radiology reports.   MR Lumbar Spine W Wo Contrast  Result Date: 10/23/2021 CLINICAL DATA:  71 year old female with increasing low back pain radiating to the right leg. Unable to walk. Planned spinal injection this week. EXAM: MRI LUMBAR SPINE WITHOUT AND WITH CONTRAST TECHNIQUE: Multiplanar and multiecho pulse sequences of the lumbar spine were obtained without and with intravenous contrast. CONTRAST:  71m GADAVIST GADOBUTROL 1 MMOL/ML IV SOLN COMPARISON:  Fluoroscopic spot images from spine injection 08/09/2021. CT Abdomen 03/04/2007. FINDINGS: Segmentation: Suggestion of transitional lumbosacral anatomy on the 2009 CT. Difficult to ascertain the lowest rib level on these images. Lumbar segmentation will be designated as normal. Correlation with radiographs is recommended prior to any operative intervention. Alignment: Mild dextroconvex upper and mild to moderate levoconvex lower lumbar scoliosis. There is subtle grade 1 anterolisthesis at both L4-L5 and L5-S1. Similar mild retrolisthesis L1-L2. Vertebrae: No marrow edema or evidence of acute osseous abnormality. Some chronic degenerative endplate marrow signal changes. Normal background bone marrow signal. Intact visible sacrum and SI joints. Conus medullaris and cauda equina: Conus extends to the T12-L1 level. No lower spinal cord or conus signal abnormality. In general the cauda equina nerve roots appear normal. There is no abnormal intradural enhancement. No dural thickening. Paraspinal and other soft tissues: Partially visible distended gallbladder. Negative visible other abdominal viscera. Negative  visualized posterior paraspinal soft tissues. Disc levels: T11-T12: Negative. T12-L1: Mild disc desiccation and disc space loss. Minor disc bulging. No stenosis. L1-L2: Disc space loss with some evidence of  vacuum disc. Circumferential disc bulging eccentric to the left. Left far lateral component. Mild to moderate facet and ligament flavum hypertrophy. No significant spinal stenosis. Mild left lateral recess stenosis (left L2 nerve level). No convincing foraminal stenosis. L2-L3: Disc space loss with some vacuum disc. Circumferential disc bulging with moderate facet and ligament flavum hypertrophy. Mild to moderate spinal stenosis. Relatively symmetric lateral recess stenosis (L3 nerve levels). Asymmetric left foraminal disc. Mild to moderate left L2 foraminal stenosis. Mild if any right foraminal stenosis. L3-L4: Disc space loss. Circumferential but mostly far lateral disc bulging. Mild to moderate facet and ligament flavum hypertrophy. No spinal or lateral recess stenosis. Mild to moderate right L3 foraminal stenosis. L4-L5: Disc space loss. Vacuum disc. Mild anterolisthesis. Circumferential disc bulge. Moderate facet and moderate to severe ligament flavum hypertrophy. Borderline spinal stenosis. Mild left lateral recess stenosis (left L5 nerve level). Mild to moderate left L4 foraminal stenosis. L5-S1: Mild anterolisthesis. Mild disc space loss and circumferential disc bulge eccentric to the left. Mild to moderate facet and ligament flavum hypertrophy. No spinal stenosis. No convincing lateral recess or right foraminal stenosis. Mild to moderate left foraminal stenosis in part related to left far lateral disc osteophyte complex. IMPRESSION: 1. Widespread lumbar spine degeneration with no acute osseous abnormality. Mild scoliosis and multilevel spondylolisthesis 2. Mild to moderate multifactorial spinal stenosis at L2-L3. Mild left lateral recess stenosis at L1-L2 and L4-L5. Up to moderate neural foraminal  stenosis at the left L2, right L3, left L4 and left L5 nerve levels. 3. Partially visible distended gallbladder. Query right upper quadrant pain. Electronically Signed   By: Genevie Ann M.D.   On: 10/23/2021 06:39     PROCEDURES:  Critical Care performed: No      Procedures    IMPRESSION / MDM / ASSESSMENT AND PLAN / ED COURSE  I reviewed the triage vital signs and the nursing notes.    Patient here with symptoms of lumbar radiculopathy.  Unable to walk at home and perform her ADLs.  Uses a walker at baseline.  Lives at home alone.     DIFFERENTIAL DIAGNOSIS (includes but not limited to):   Lumbar radiculopathy, muscle strain, muscle spasm, lower suspicion for cauda equina, epidural abscess or hematoma, discitis or osteomyelitis, transverse myelitis.   Patient's presentation is most consistent with acute presentation with potential threat to life or bodily function.   PLAN: Patient's urine was obtained in triage and does not appear grossly infected.  Will obtain MRI of the lumbar spine with and without contrast.  We discussed concern for potential infection given recent epidural injection although she is afebrile here and denies any fevers or chills at home.  Will obtain basic blood work.  We will give pain medication, Decadron.  She has already received hydrocodone in triage without much relief.   MEDICATIONS GIVEN IN ED: Medications  HYDROcodone-acetaminophen (NORCO/VICODIN) 5-325 MG per tablet 1 tablet (1 tablet Oral Given 10/23/21 0039)  ondansetron (ZOFRAN-ODT) disintegrating tablet 4 mg (4 mg Oral Given 10/23/21 0039)  morphine (PF) 4 MG/ML injection 4 mg (4 mg Intravenous Given 10/23/21 0442)  ondansetron (ZOFRAN) injection 4 mg (4 mg Intravenous Given 10/23/21 0444)  dexamethasone (DECADRON) injection 10 mg (10 mg Intravenous Given 10/23/21 0444)  gadobutrol (GADAVIST) 1 MMOL/ML injection 9 mL (9 mLs Intravenous Contrast Given 10/23/21 0623)  morphine (PF) 4 MG/ML injection 4  mg (4 mg Intravenous Given by Other 10/23/21 0539)     ED COURSE: Patient's labs show no leukocytosis.  Normal  electrolytes and renal function.  Patient reportedly unable to tolerate all of the MRI due to "screaming out in pain" per the MRI technician.  He was able to complete the portion without contrast but not with contrast.  Given no fevers or leukocytosis I feel that it is reasonable to just get the MRI without contrast at this time.  She would likely need admission for pain control.    She was given a second round of pain medication was able to tolerate the rest of her MRI.  MRI reviewed and interpreted by myself and the radiologist and shows degenerative changes with mild to moderate stenosis and foraminal stenosis but no significant herniated disc and no neurosurgical emergency or infectious etiology.  Patient has significant pain with movement and especially with trying to stand or changing position.  She is requesting admission for pain control and to see physical therapy and discussed with the hospitalist for possible rehab placement given she is not able to care for herself at home and unable to ambulate with her walker.  I feel this is reasonable.  She has received Vicodin, 2 rounds of morphine and Decadron here and continues to have significant discomfort.  CONSULTS:  Consulted and discussed patient's case with hospitalist, Dr. Roosevelt Locks.  I have recommended admission and consulting physician agrees and will place admission orders.  Patient (and family if present) agree with this plan.   I reviewed all nursing notes, vitals, pertinent previous records.  All labs, EKGs, imaging ordered have been independently reviewed and interpreted by myself.    OUTSIDE RECORDS REVIEWED: Reviewed patient's last PMR note on 09/28/2021 where she had an injection in her sacroiliac joint by Dr. Letta Pate.       FINAL CLINICAL IMPRESSION(S) / ED DIAGNOSES   Final diagnoses:  Acute low back pain,  unspecified back pain laterality, unspecified whether sciatica present     Rx / DC Orders   ED Discharge Orders     None        Note:  This document was prepared using Dragon voice recognition software and may include unintentional dictation errors.   Ayleah Hofmeister, Delice Bison, DO 10/23/21 [phone]

## 2021-10-23 NOTE — Telephone Encounter (Signed)
noted 

## 2021-10-23 NOTE — H&P (Signed)
History and Physical    Wanda Lang FSF:[id number] DOB: July 18, 1950 DOA: 10/23/2021  PCP: Leone Haven, MD (Confirm with patient/family/NH records and if not entered, this has to be entered at [facility] point of entry) Patient coming from: Home  I have personally briefly reviewed patient's old medical records in [place]  Chief Complaint: Back pain   HPI: Wanda Lang is a 71 y.o. female with medical history significant of lumbar stenosis L1-L2-L3, rheumatoid arthritis on DMARD,  SI joint arthritis status post cortisone/analgesic injection in late August 2023, OA status post right TKR, CAD, HTN, came with worsening of lower back pain.  Patient has chronic lower back pain secondary to severe right-sided SI joint arthritis underwent fluoroscopy guided injection by pain management in August this year.  She achieved some relief afterwards.  She was scheduled to follow-up in 5 to 6 weeks to have the next injection.  Last week, she started to have worsening of right-sided back pain, this time pain shooting down to the side of lower thigh, and she developed cramping-like pain in her legs and she also noticed that she cannot straighten her right leg because of pain.  As result, she has had difficulty to stand on her feet and even walk inside her house.  Denies any urine or bowel movement problems no fever chills. ED Course: Afebrile, none tachycardia no hypotension.  MRI showed chronic lumbar spine degeneration, and lumbar stenosis at multiple levels L2 L5.  WBC 7.4.  Review of Systems: As per HPI otherwise 14 point review of systems negative.    Past Medical History:  Diagnosis Date   Allergy    Arthritis    Chronic kidney disease    Coronary artery disease    GERD (gastroesophageal reflux disease)    Heart murmur    Hypertension    LIVER FUNCTION TESTS, ABNORMAL, HX OF 09/08/2007   Qualifier: Diagnosis of  By: Harlon Ditty CMA (AAMA), Dottie      Past Surgical History:   Procedure Laterality Date   ABDOMINAL HYSTERECTOMY     COLONOSCOPY WITH PROPOFOL N/A 09/04/2018   Procedure: COLONOSCOPY WITH PROPOFOL;  Surgeon: Virgel Manifold, MD;  Location: [facility] ENDOSCOPY;  Service: Endoscopy;  Laterality: N/A;   ESOPHAGOGASTRODUODENOSCOPY (EGD) WITH PROPOFOL N/A 09/04/2018   Procedure: ESOPHAGOGASTRODUODENOSCOPY (EGD) WITH PROPOFOL;  Surgeon: Virgel Manifold, MD;  Location: [facility] ENDOSCOPY;  Service: Endoscopy;  Laterality: N/A;   LAPAROSCOPY     TONSILLECTOMY     TOTAL KNEE ARTHROPLASTY Left 07/18/2021   Procedure: TOTAL KNEE ARTHROPLASTY;  Surgeon: Paralee Cancel, MD;  Location: [facility];  Service: Orthopedics;  Laterality: Left;   TURBINATE REDUCTION       reports that she has never smoked. She has never used smokeless tobacco. She reports current alcohol use of about 1.0 standard drink of alcohol per week. She reports that she does not use drugs.  Allergies  Allergen Reactions   Penicillins Itching    Tolerated Cephalosporin 07/18/21.   Other reaction(s): wheezing   Amoxicillin Itching   Misc. Sulfonamide Containing Compounds    Other     Dairy products - belly aches and facial rash    Rosanil Cleanser [Sulfacetamide Sodium-Sulfur] Other (See Comments)    Unknown reaction    Sulfonamide Derivatives     Unknown reaction    Ylang-Ylang [Cananga Oil (Ylang-Ylang)] Itching    Family History  Problem Relation Age of Onset   Alcohol abuse Mother    Arthritis Mother  Hyperlipidemia Mother    Heart disease Mother    Stroke Mother    Hypertension Mother    Depression Mother    Anxiety disorder Mother    Arthritis Father    Lung cancer Paternal Grandfather    Kidney disease Paternal Grandfather      Prior to Admission medications   Medication Sig Start Date End Date Taking? Authorizing Provider  acetaminophen (TYLENOL) 500 MG tablet Take 1,000 mg by mouth every 6 (six) hours as needed.    [provider]  CALCIUM PO Take 1,200 mg  by mouth daily.    [provider]  celecoxib (CELEBREX) 200 MG capsule Take 1 capsule (200 mg total) by mouth 2 (two) times daily. Do not start this medication until you finish the prednisone dose pack. 07/20/21   Irving Copas, PA-C  cycloSPORINE (RESTASIS) 0.05 % ophthalmic emulsion Place 1 drop into both eyes 2 (two) times daily.    [provider]  Docusate Sodium (DSS) 100 MG CAPS Take 1 capsule by mouth 2 (two) times daily. 07/28/21   [provider]  estradiol (VIVELLE-DOT) 0.05 MG/24HR patch Place 1 patch onto the skin 2 (two) times a week.    [provider]  Estradiol 10 MCG TABS vaginal tablet Place 10 mcg vaginally every Monday, Wednesday, and Friday.    [provider]  Flaxseed, Linseed, (FLAXSEED OIL) 1200 MG CAPS Take 1,200 mg by mouth daily.    [provider]  Glucosamine HCl 1000 MG TABS Take 1,000 mg by mouth daily.    [provider]  hydroxychloroquine (PLAQUENIL) 200 MG tablet Take 200 mg by mouth 2 (two) times daily.    [provider]  lidocaine (LIDODERM) 5 % Place 1 patch onto the skin every 12 (twelve) hours. Remove & Discard patch within 12 hours or as directed by MD 06/12/21 06/12/22  Vladimir Crofts, MD  lidocaine 4 % Place 1 patch onto the skin daily as needed (pain).    [provider]  magnesium gluconate (MAGONATE) 500 MG tablet Take 500 mg by mouth 3 (three) times a week.    [provider]  methocarbamol (ROBAXIN) 500 MG tablet Take 1 tablet by mouth every 6 (six) hours as needed.    [provider]  mometasone (ELOCON) 0.1 % cream Apply topically to affected areas behind ears and neck twice daily for itchy rash use for up to 2 weeks. Can also use at itchy areas at abdomen. 10/19/21   Ralene Bathe, MD  mometasone (ELOCON) 0.1 % lotion Apply topically to scalp daily for itchy spots use for up to 2 weeks. If no longer itchy can d/c 10/19/21   Ralene Bathe, MD  OVER  THE COUNTER MEDICATION Take 1 mL by mouth daily. CBD oil    [provider]  oxyCODONE (OXY IR/ROXICODONE) 5 MG immediate release tablet Take 1 tablet by mouth every 4 (four) hours as needed. 07/31/21   [provider]  polyethylene glycol (MIRALAX / GLYCOLAX) 17 g packet Take 17 g by mouth daily as needed for mild constipation. 07/20/21   Irving Copas, PA-C  POTASSIUM PO Take 3 tablets by mouth daily.    [provider]  predniSONE (DELTASONE) 10 MG tablet Take 40 mg by mouth on day 1, then taper 10 mg daily until gone 10/16/21   Burnard Hawthorne, FNP  pseudoephedrine (SUDAFED) 120 MG 12 hr tablet Take 60-120 mg by mouth daily as needed for congestion.  [provider]  traMADol (ULTRAM) 50 MG tablet     [provider]  TURMERIC CURCUMIN PO Take 1 capsule by mouth daily.    [provider]  TURMERIC CURCUMIN PO Take 1 tablet by mouth daily.    [provider]  zinc gluconate 50 MG tablet Take 50 mg by mouth 3 (three) times a week.    [provider]    Physical Exam: Vitals:   10/23/21 0800 10/23/21 0900 10/23/21 1000 10/23/21 1002  BP: (!) 150/79 133/89 121/75   Pulse: 91 92 (!) 104   Resp:      Temp:    98.4 F (36.9 C)  TempSrc:    Oral  SpO2: 96% 97% 95%   Weight:      Height:        Constitutional: NAD, calm, comfortable Vitals:   10/23/21 0800 10/23/21 0900 10/23/21 1000 10/23/21 1002  BP: (!) 150/79 133/89 121/75   Pulse: 91 92 (!) 104   Resp:      Temp:    98.4 F (36.9 C)  TempSrc:    Oral  SpO2: 96% 97% 95%   Weight:      Height:       Eyes: PERRL, lids and conjunctivae normal ENMT: Mucous membranes are moist. Posterior pharynx clear of any exudate or lesions.Normal dentition.  Neck: normal, supple, no masses, no thyromegaly Respiratory: clear to auscultation bilaterally, no wheezing, no crackles. Normal respiratory effort. No accessory muscle use.  Cardiovascular: Regular rate and  rhythm, no murmurs / rubs / gallops. No extremity edema. 2+ pedal pulses. No carotid bruits.  Abdomen: no tenderness, no masses palpated. No hepatosplenomegaly. Bowel sounds positive.  Musculoskeletal: no clubbing / cyanosis. No joint deformity upper and lower extremities. Good ROM, no contractures. Normal muscle tone.  Tenderness on lower back L3-L5 level, reduced straight leg test on right side to about 30 degree and started causing severe shooting down pain on right leg Skin: no rashes, lesions, ulcers. No induration Neurologic: CN 2-12 grossly intact. Sensation intact, DTR normal.  Decreased dorsiflexion of right foot compared to left side Psychiatric: Normal judgment and insight. Alert and oriented x 3. Normal mood.     Labs on Admission: I have personally reviewed following labs and imaging studies  CBC: Recent Labs  Lab 10/23/21 0448  WBC 7.4  NEUTROABS 4.1  HGB 13.2  HCT 41.1  MCV 98.6  PLT 485   Basic Metabolic Panel: Recent Labs  Lab 10/23/21 0448  NA 142  K 4.0  CL 106  CO2 27  GLUCOSE 99  BUN 26*  CREATININE 0.82  CALCIUM 9.2   GFR: Estimated Creatinine Clearance: 69.6 mL/min (by C-G formula based on SCr of 0.82 mg/dL). Liver Function Tests: No results for input(s): "AST", "ALT", "ALKPHOS", "BILITOT", "PROT", "ALBUMIN" in the last 168 hours. No results for input(s): "LIPASE", "AMYLASE" in the last 168 hours. No results for input(s): "AMMONIA" in the last 168 hours. Coagulation Profile: No results for input(s): "INR", "PROTIME" in the last 168 hours. Cardiac Enzymes: No results for input(s): "CKTOTAL", "CKMB", "CKMBINDEX", "TROPONINI" in the last 168 hours. BNP (last 3 results) No results for input(s): "PROBNP" in the last 8760 hours. HbA1C: No results for input(s): "HGBA1C" in the last 72 hours. CBG: No results for input(s): "GLUCAP" in the last 168 hours. Lipid Profile: No results for input(s): "CHOL", "HDL", "LDLCALC", "TRIG", "CHOLHDL", "LDLDIRECT"  in the last 72 hours. Thyroid Function Tests: No results for input(s): "TSH", "T4TOTAL", "  FREET4", "T3FREE", "THYROIDAB" in the last 72 hours. Anemia Panel: No results for input(s): "VITAMINB12", "FOLATE", "FERRITIN", "TIBC", "IRON", "RETICCTPCT" in the last 72 hours. Urine analysis:    Component Value Date/Time   COLORURINE AMBER (A) 10/23/2021 0120   APPEARANCEUR CLOUDY (A) 10/23/2021 0120   LABSPEC >1.046 (H) 10/23/2021 0120   PHURINE 5.0 10/23/2021 0120   GLUCOSEU NEGATIVE 10/23/2021 0120   HGBUR NEGATIVE 10/23/2021 0120   BILIRUBINUR MODERATE (A) 10/23/2021 0120   BILIRUBINUR negative 07/07/2021 1217   KETONESUR 5 (A) 10/23/2021 0120   PROTEINUR 100 (A) 10/23/2021 0120   UROBILINOGEN 1.0 10/06/2021 1647   NITRITE NEGATIVE 10/23/2021 0120   LEUKOCYTESUR TRACE (A) 10/23/2021 0120    Radiological Exams on Admission: MR Lumbar Spine W Wo Contrast  Result Date: 10/23/2021 CLINICAL DATA:  71 year old female with increasing low back pain radiating to the right leg. Unable to walk. Planned spinal injection this week. EXAM: MRI LUMBAR SPINE WITHOUT AND WITH CONTRAST TECHNIQUE: Multiplanar and multiecho pulse sequences of the lumbar spine were obtained without and with intravenous contrast. CONTRAST:  46m GADAVIST GADOBUTROL 1 MMOL/ML IV SOLN COMPARISON:  Fluoroscopic spot images from spine injection 08/09/2021. CT Abdomen 03/04/2007. FINDINGS: Segmentation: Suggestion of transitional lumbosacral anatomy on the 2009 CT. Difficult to ascertain the lowest rib level on these images. Lumbar segmentation will be designated as normal. Correlation with radiographs is recommended prior to any operative intervention. Alignment: Mild dextroconvex upper and mild to moderate levoconvex lower lumbar scoliosis. There is subtle grade 1 anterolisthesis at both L4-L5 and L5-S1. Similar mild retrolisthesis L1-L2. Vertebrae: No marrow edema or evidence of acute osseous abnormality. Some chronic degenerative  endplate marrow signal changes. Normal background bone marrow signal. Intact visible sacrum and SI joints. Conus medullaris and cauda equina: Conus extends to the T12-L1 level. No lower spinal cord or conus signal abnormality. In general the cauda equina nerve roots appear normal. There is no abnormal intradural enhancement. No dural thickening. Paraspinal and other soft tissues: Partially visible distended gallbladder. Negative visible other abdominal viscera. Negative visualized posterior paraspinal soft tissues. Disc levels: T11-T12: Negative. T12-L1: Mild disc desiccation and disc space loss. Minor disc bulging. No stenosis. L1-L2: Disc space loss with some evidence of vacuum disc. Circumferential disc bulging eccentric to the left. Left far lateral component. Mild to moderate facet and ligament flavum hypertrophy. No significant spinal stenosis. Mild left lateral recess stenosis (left L2 nerve level). No convincing foraminal stenosis. L2-L3: Disc space loss with some vacuum disc. Circumferential disc bulging with moderate facet and ligament flavum hypertrophy. Mild to moderate spinal stenosis. Relatively symmetric lateral recess stenosis (L3 nerve levels). Asymmetric left foraminal disc. Mild to moderate left L2 foraminal stenosis. Mild if any right foraminal stenosis. L3-L4: Disc space loss. Circumferential but mostly far lateral disc bulging. Mild to moderate facet and ligament flavum hypertrophy. No spinal or lateral recess stenosis. Mild to moderate right L3 foraminal stenosis. L4-L5: Disc space loss. Vacuum disc. Mild anterolisthesis. Circumferential disc bulge. Moderate facet and moderate to severe ligament flavum hypertrophy. Borderline spinal stenosis. Mild left lateral recess stenosis (left L5 nerve level). Mild to moderate left L4 foraminal stenosis. L5-S1: Mild anterolisthesis. Mild disc space loss and circumferential disc bulge eccentric to the left. Mild to moderate facet and ligament flavum  hypertrophy. No spinal stenosis. No convincing lateral recess or right foraminal stenosis. Mild to moderate left foraminal stenosis in part related to left far lateral disc osteophyte complex. IMPRESSION: 1. Widespread lumbar spine degeneration with no acute osseous abnormality. Mild  scoliosis and multilevel spondylolisthesis 2. Mild to moderate multifactorial spinal stenosis at L2-L3. Mild left lateral recess stenosis at L1-L2 and L4-L5. Up to moderate neural foraminal stenosis at the left L2, right L3, left L4 and left L5 nerve levels. 3. Partially visible distended gallbladder. Query right upper quadrant pain. Electronically Signed   By: Genevie Ann M.D.   On: 10/23/2021 06:39    EKG: None  Assessment/Plan Principal Problem:   Impaired ambulation Active Problems:   Spinal stenosis, lumbar region, without neurogenic claudication  (please populate well all problems here in Problem List. (For example, if patient is on BP meds at home and you resume or decide to hold them, it is a problem that needs to be her. Same for CAD, COPD, HLD and so on)  Acute ambulation dysfunction -Secondary to acute on chronic multiple lumbar spine degenerative OA/rheumatoid arthritis -MRI reassuring, symptoms signs of acute cauda equina syndrome -Symptomatic management, pain control and PT evaluation.  May need short-term rehab. -If symptoms controlled, she probably can be followed by outpatient pain management for another round of SI joint injection.  Discussed with patient and her husband at bedside, both expressed understanding and agreed with the plan. -Other DDx, check CRP and ESR, may consider to escalate her RA treatment for short course of steroid.  Obesity -BMI 34, she has been on diet and lost about 40 pounds since last year.  And she plans to continue to do so.  RA -Continue hydroxychloroquine, check ESR and CRP and consider short course of steroid.  DVT prophylaxis: Lovenox Code Status: Full code Family  Communication: Husband at bedside Disposition Plan: Expect less than 2 midnight hospital stay Consults called: None Admission status: MedSurg observation   Lequita Halt MD [facility] Pager [phone] 10/23/2021, 10:58 AM

## 2021-10-23 NOTE — Evaluation (Signed)
Physical Therapy Evaluation Patient Details Name: Wanda Lang MRN: [id number] DOB: Nov 01, 1950 Today's Date: 10/23/2021  History of Present Illness  Pt is a 71 y.o. female with history of hypertension, chronic kidney disease, L TKA, CAD who presents to the emergency department for uncontrolled back pain and inability to care for herself.   Clinical Impression  Patient alert, oriented, originally reported 4/10 R back pain with some radiation into R thigh. Pt reported at baseline she is independent in ADLs, IADLs, has been using a borrowed RW. In the last two weeks pt has increasingly been unable to care for herself due to uncontrolled R sided back pain.  PT and pt did discuss some pain management strategies which pt has already implemented (heel lift and removal of contralateral insert to address leg length discrepancy, shoe wearing with any mobility, DME use and appropriate sizing, etc). She was able to get to sitting with supervision, but unable to move RLE against gravity without assistance (either from PT or with her own hands). She ambulated ~51f with RW and supervision but reported increased pain to 6/10, and an inability to ambulate further.  Overall the patient demonstrated deficits (see "PT Problem List") that impede the patient's functional abilities, safety, and mobility and would benefit from skilled PT intervention. Recommendation at this time is SNF due to decline in functional status and lack of caregiver support at home.        Recommendations for follow up therapy are one component of a multi-disciplinary discharge planning process, led by the attending physician.  Recommendations may be updated based on patient status, additional functional criteria and insurance authorization.  Follow Up Recommendations Skilled nursing-short term rehab (<3 hours/day) Can patient physically be transported by private vehicle: Yes    Assistance Recommended at Discharge Intermittent  Supervision/Assistance  Patient can return home with the following  A little help with walking and/or transfers;A little help with bathing/dressing/bathroom;Assistance with cooking/housework;Assist for transportation;Help with stairs or ramp for entrance    Equipment Recommendations Rolling walker (2 wheels) (pt reported her walker at home is a borrowed walker and does not adjust to her height)  Recommendations for Other Services       Functional Status Assessment Patient has had a recent decline in their functional status and demonstrates the ability to make significant improvements in function in a reasonable and predictable amount of time.     Precautions / Restrictions Precautions Precautions: Fall Restrictions Weight Bearing Restrictions: No      Mobility  Bed Mobility Overal bed mobility: Needs Assistance Bed Mobility: Supine to Sit, Sit to Supine     Supine to sit: Supervision Sit to supine: Min assist, HOB elevated        Transfers Overall transfer level: Needs assistance Equipment used: Rolling walker (2 wheels) Transfers: Sit to/from Stand Sit to Stand: Supervision                Ambulation/Gait Ambulation/Gait assistance: Supervision Gait Distance (Feet): 60 Feet Assistive device: Rolling walker (2 wheels)         General Gait Details: pt limited by pain, unable to ambulate >[id number] to continued increased leg pain  Stairs            Wheelchair Mobility    Modified Rankin (Stroke Patients Only)       Balance Overall balance assessment: Needs assistance Sitting-balance support: Feet supported Sitting balance-Leahy Scale: Good     Standing balance support: Reliant on assistive device for balance Standing balance-Leahy Scale:  Fair                               Pertinent Vitals/Pain Pain Assessment Pain Assessment: 0-10 Pain Score: 6  Pain Location: R sided back pain, down to her thigh Pain Descriptors /  Indicators: Aching, Grimacing Pain Intervention(s): Limited activity within patient's tolerance, Monitored during session, Repositioned    Home Living Family/patient expects to be discharged to:: Private residence Living Arrangements: Alone Available Help at Discharge: Friend(s);Available PRN/intermittently Type of Home: House Home Access: Stairs to enter Entrance Stairs-Rails: Left Entrance Stairs-Number of Steps: 5-6. does also have a total of 4 steps to get to the kitchen and back to the bedroom   Home Layout: Able to live on main level with bedroom/bathroom;Two level Home Equipment: Cane - single point      Prior Function Prior Level of Function : Independent/Modified Independent             Mobility Comments: in the last two weeks has been very challenged by mobility and ADLs due to pain       Hand Dominance        Extremity/Trunk Assessment   Upper Extremity Assessment Upper Extremity Assessment: Overall WFL for tasks assessed         Cervical / Trunk Assessment Cervical / Trunk Assessment: Normal  Communication   Communication: No difficulties  Cognition Arousal/Alertness: Awake/alert Behavior During Therapy: WFL for tasks assessed/performed Overall Cognitive Status: Within Functional Limits for tasks assessed                                          General Comments      Exercises     Assessment/Plan    PT Assessment Patient needs continued PT services  PT Problem List Decreased strength;Decreased mobility;Decreased range of motion;Decreased activity tolerance;Decreased balance;Decreased knowledge of use of DME;Pain       PT Treatment Interventions DME instruction;Therapeutic exercise;Gait training;Balance training;Stair training;Neuromuscular re-education;Functional mobility training;Therapeutic activities;Patient/family education    PT Goals (Current goals can be found in the Care Plan section)  Acute Rehab PT Goals Patient  Stated Goal: to go home PT Goal Formulation: With patient Time For Goal Achievement: 11/06/21 Potential to Achieve Goals: Good    Frequency Min 2X/week     Co-evaluation               AM-PAC PT "6 Clicks" Mobility  Outcome Measure Help needed turning from your back to your side while in a flat bed without using bedrails?: A Little Help needed moving from lying on your back to sitting on the side of a flat bed without using bedrails?: A Little Help needed moving to and from a bed to a chair (including a wheelchair)?: A Little Help needed standing up from a chair using your arms (e.g., wheelchair or bedside chair)?: A Little Help needed to walk in hospital room?: A Little Help needed climbing 3-5 steps with a railing? : A Little 6 Click Score: 18    End of Session Equipment Utilized During Treatment: Gait belt Activity Tolerance: Patient tolerated treatment well Patient left: with bed alarm set;in bed Nurse Communication: Mobility status PT Visit Diagnosis: Other abnormalities of gait and mobility (R26.89);Difficulty in walking, not elsewhere classified (R26.2);Muscle weakness (generalized) (M62.81);Pain Pain - Right/Left: Right Pain - part of body:  (back)  Time: 0347-4259 PT Time Calculation (min) (ACUTE ONLY): 19 min   Charges:   PT Evaluation $PT Eval Low Complexity: 1 Low PT Treatments $Therapeutic Activity: 8-22 mins        Lieutenant Diego PT, DPT 9:52 AM,10/23/21

## 2021-10-24 ENCOUNTER — Ambulatory Visit: Payer: Medicare PPO | Admitting: Family

## 2021-10-24 ENCOUNTER — Observation Stay: Payer: Medicare PPO

## 2021-10-24 DIAGNOSIS — M533 Sacrococcygeal disorders, not elsewhere classified: Secondary | ICD-10-CM | POA: Diagnosis not present

## 2021-10-24 DIAGNOSIS — R262 Difficulty in walking, not elsewhere classified: Secondary | ICD-10-CM | POA: Diagnosis not present

## 2021-10-24 MED ORDER — LIDOCAINE HCL (PF) 1 % IJ SOLN
INTRAMUSCULAR | Status: AC
  Filled 2021-10-24: qty 30

## 2021-10-24 MED ORDER — METHYLPREDNISOLONE ACETATE 80 MG/ML IJ SUSP
INTRAMUSCULAR | Status: AC
  Filled 2021-10-24: qty 1

## 2021-10-24 MED ORDER — ACETAMINOPHEN 500 MG PO TABS
1000.0000 mg | ORAL_TABLET | Freq: Four times a day (QID) | ORAL | Status: DC
  Administered 2021-10-25 – 2021-10-26 (×5): 1000 mg via ORAL
  Filled 2021-10-24 (×6): qty 2

## 2021-10-24 NOTE — TOC Initial Note (Signed)
Transition of Care [facility]) - Initial/Assessment Note    Patient Details  Name: Wanda Lang MRN: [id number] Date of Birth: 04-Jun-1950  Transition of Care [facility]) CM/SW Contact:    Beverly Sessions, RN Phone Number: 10/24/2021, 2:53 PM  Clinical Narrative:                  Admitted KXF:[id number] ambulation  Admitted from: home alone PCP: Caryl Bis Current home health/prior home health/DME: RW, elevated toilet seat    Therapy recommending SNF.  Patient is not sure if she is going to do home health or SNF.  She would like to await determination if she will be able to receive her "back injection" while she is here or not.  She is agreeable to a bed search incase she decides to go to rehab  PASSR obtained Fl2 sent for signature Bed search initiated      Patient Goals and CMS Choice        Expected Discharge Plan and Services                                                Prior Living Arrangements/Services                       Activities of Daily Living Home Assistive Devices/Equipment: Built-in shower seat, Environmental consultant (specify type) ADL Screening (condition at time of admission) Patient's cognitive ability adequate to safely complete daily activities?: Yes Is the patient deaf or have difficulty hearing?: No Does the patient have difficulty seeing, even when wearing glasses/contacts?: No Does the patient have difficulty concentrating, remembering, or making decisions?: No Patient able to express need for assistance with ADLs?: Yes Does the patient have difficulty dressing or bathing?: Yes Independently performs ADLs?: No Communication: Independent Dressing (OT): Needs assistance Is this a change from baseline?: Change from baseline, expected to last <3days Grooming: Independent Feeding: Independent Bathing: Independent with device (comment) (shower seat) Toileting: Independent with device (comment) (elevated toliet seat) In/Out Bed: Needs  assistance Is this a change from baseline?: Change from baseline, expected to last <3 days Walks in Home: Needs assistance Is this a change from baseline?: Change from baseline, expected to last <3 days Does the patient have difficulty walking or climbing stairs?: Yes Weakness of Legs: Both (pain in legs) Weakness of Arms/Hands: None  Permission Sought/Granted                  Emotional Assessment              Admission diagnosis:  Intractable pain [R52] Impaired ambulation [R26.2] Unable to bear weight on right lower extremity [R26.89] Acute low back pain, unspecified back pain laterality, unspecified whether sciatica present [M54.50] Patient Active Problem List   Diagnosis Date Noted   Impaired ambulation 10/23/2021   Contact dermatitis 10/16/2021   Sacroiliac joint disease 08/24/2021   Spinal stenosis, lumbar region, without neurogenic claudication 08/24/2021   S/P total knee arthroplasty, left 07/18/2021   Preop examination 07/07/2021   Right hip pain 07/07/2021   Low back pain 07/07/2021   COVID-19 01/24/2021   Duodenal ulcer 11/12/2019   Stress 11/12/2019   Morbid obesity ([place]) 11/06/2019   Heart murmur 02/06/2018   Chronic diarrhea 10/09/2017   Acid reflux 07/15/2017   Asymptomatic carotid artery stenosis 07/15/2017   Cyst of skin 07/15/2017  Immunocompromised ([place]) 11/09/2016   Allergic rhinitis 11/02/2016   Tremor 11/02/2016   History of hypertension 11/02/2016   Postmenopausal symptoms 07/01/2015   Seasonal allergic rhinitis due to pollen 07/01/2015   Fatty liver 09/08/2007   Rheumatoid arthritis ([place]) 09/08/2007   PCP:  Leone Haven, MD Pharmacy:   [facility] [address] [place] [address] Phone: [phone] Fax: [phone]  [id number] [address] [address] [place]Phone: [phone]: [phone]    Social  Determinants of Health (SDOH) Interventions    Readmission Risk Interventions     No data to display

## 2021-10-24 NOTE — NC FL2 (Signed)
[place] LEVEL OF CARE SCREENING TOOL     IDENTIFICATION  Patient Name: Wanda Lang Birthdate: 04-30-1950 Sex: female Admission Date (Current Location): 10/23/2021  [facility] [place] Number:  Engineering geologist and Address:         Provider Number: [id number]  Attending Physician Name and Address:  Enzo Bi, MD  Relative Name and Phone Number:       Current Level of Care: Hospital Recommended Level of Care: [place] Prior Approval Number:    Date Approved/Denied:   PASRR Number: [id number]  Discharge Plan: SNF    Current Diagnoses: Patient Active Problem List   Diagnosis Date Noted   Impaired ambulation 10/23/2021   Contact dermatitis 10/16/2021   Sacroiliac joint disease 08/24/2021   Spinal stenosis, lumbar region, without neurogenic claudication 08/24/2021   S/P total knee arthroplasty, left 07/18/2021   Preop examination 07/07/2021   Right hip pain 07/07/2021   Low back pain 07/07/2021   COVID-19 01/24/2021   Duodenal ulcer 11/12/2019   Stress 11/12/2019   Morbid obesity ([place]) 11/06/2019   Heart murmur 02/06/2018   Chronic diarrhea 10/09/2017   Acid reflux 07/15/2017   Asymptomatic carotid artery stenosis 07/15/2017   Cyst of skin 07/15/2017   Immunocompromised ([place]) 11/09/2016   Allergic rhinitis 11/02/2016   Tremor 11/02/2016   History of hypertension 11/02/2016   Postmenopausal symptoms 07/01/2015   Seasonal allergic rhinitis due to pollen 07/01/2015   Fatty liver 09/08/2007   Rheumatoid arthritis ([place]) 09/08/2007    Orientation RESPIRATION BLADDER Height & Weight     Self, Time, Situation, Place  Normal Continent Weight: 90.7 kg Height:  '5\' 4"'$  (162.6 cm)  BEHAVIORAL SYMPTOMS/MOOD NEUROLOGICAL BOWEL NUTRITION STATUS      Continent Diet (Regular)  AMBULATORY STATUS COMMUNICATION OF NEEDS Skin   Supervision Verbally Normal                       Personal Care Assistance Level of Assistance               Functional Limitations Info             SPECIAL CARE FACTORS FREQUENCY  PT (By licensed PT), OT (By licensed OT)                    Contractures Contractures Info: Not present    Additional Factors Info  Code Status, Allergies Code Status Info: Full Allergies Info: Penicillins, Amoxicillin, Misc. Sulfonamide Containing Compounds, Rosanil Cleanser (Sulfacetamide Sodium-sulfur), Sulfonamide Derivatives, Ylang-ylang ([place] Oil (Ylang-ylang))           Current Medications (10/24/2021):  This is the current hospital active medication list Current Facility-Administered Medications  Medication Dose Route Frequency Provider Last Rate Last Admin   acetaminophen (TYLENOL) tablet 1,000 mg  1,000 mg Oral Q6H PRN Wynetta Fines T, MD   1,000 mg at 10/24/21 1114   cycloSPORINE (RESTASIS) 0.05 % ophthalmic emulsion 1 drop  1 drop Both Eyes BID Wynetta Fines T, MD   1 drop at 10/24/21 0814   docusate sodium (COLACE) capsule 100 mg  100 mg Oral BID Wynetta Fines T, MD   100 mg at 10/24/21 0814   enoxaparin (LOVENOX) injection 45 mg  0.5 mg/kg Subcutaneous Q24H Wynetta Fines T, MD   45 mg at 10/23/21 2201   hydroxychloroquine (PLAQUENIL) tablet 200 mg  200 mg Oral BID Wynetta Fines T, MD   200 mg at 10/24/21 [phone]  lidocaine (LIDODERM) 5 % 1 patch  1 patch Transdermal Q12H Lequita Halt, MD   1 patch at 10/24/21 0814   magnesium oxide (MAG-OX) tablet 400 mg  400 mg Oral Once per day on Mon Wed Fri Zhang, Ping T, MD   400 mg at 10/23/21 6734   methocarbamol (ROBAXIN) tablet 500 mg  500 mg Oral Q6H PRN Wynetta Fines T, MD   500 mg at 10/24/21 0814   mometasone (ELOCON) 0.1 % cream 1 Application  1 Application Topical BID Wynetta Fines T, MD       oxyCODONE (Oxy IR/ROXICODONE) immediate release tablet 5 mg  5 mg Oral Q4H PRN Wynetta Fines T, MD   5 mg at 10/24/21 1328   polyethylene glycol (MIRALAX / GLYCOLAX) packet 17 g  17 g Oral Daily PRN Lequita Halt, MD         Discharge  Medications: Please see discharge summary for a list of discharge medications.  Relevant Imaging Results:  Relevant Lab Results:   Additional Information ss [id number]  Beverly Sessions, RN

## 2021-10-24 NOTE — TOC Progression Note (Signed)
Transition of Care [facility]) - Progression Note    Patient Details  Name: Wanda Lang MRN: [id number] Date of Birth: Jan 04, 1951  Transition of Care [facility]) CM/SW Contact  Beverly Sessions, RN Phone Number: 10/24/2021, 4:01 PM  Clinical Narrative:     Bed offers presented Patient accepted [name] Accepted in [place] and notified Sharyn Lull at [facility] started in [place] portal Patient would like to work with PT again.  MD, PT, and bedside RN updated         Expected Discharge Plan and Services                                                 Social Determinants of Health (SDOH) Interventions    Readmission Risk Interventions     No data to display

## 2021-10-24 NOTE — Plan of Care (Signed)

## 2021-10-24 NOTE — Progress Notes (Signed)
PT Cancellation Note  Patient Details Name: Wanda Lang MRN: [id number] DOB: 05/02/1950   Cancelled Treatment:     Patient off floor. Will reattempt at a later date.   Glenice Laine MPH, SPT 10/24/21, 3:37 PM

## 2021-10-24 NOTE — Progress Notes (Signed)
  PROGRESS NOTE    Wanda Lang  EXB:[id number] DOB: [birth year] DOA: 10/23/2021 PCP: Leone Haven, MD  211A/211A-AA  LOS: 0 days   Brief hospital course:   Assessment & Plan: Wanda Lang is a 71 y.o. female with medical history significant of lumbar stenosis L1-L2-L3, rheumatoid arthritis on DMARD,  SI joint arthritis status post cortisone/analgesic injection in late August 2023, OA status post right TKR, CAD, HTN, came with worsening of lower back pain.   Patient has chronic lower back pain secondary to severe right-sided SI joint arthritis underwent fluoroscopy guided injection by pain management in August this year.  She achieved some relief afterwards.  She was scheduled to follow-up in 5 to 6 weeks to have the next injection on 9/20.  Last week, she started to have worsening of right-sided back pain, this time pain shooting down to the side of lower thigh, and she developed cramping-like pain in her legs and she also noticed that she cannot straighten her right leg because of pain.  As result, she has had difficulty to stand on her feet and therefore came to the ED.    Acute ambulation dysfunction 2/2 Right SI joint arthritis  --Pt was certain her pain is from her right SI joint and said what she needed was her scheduled SI joint injection. Plan: --IR for right SI joint injection of steroid today --PT eval afterwards to see if pt still needs SNF   Widespread lumbar spine degeneration Various degree of lumbar stenosis --cont oxycodone 5 mg PRN --cont Robaxin 500 mg PRN --schedule tylenol 1000 mg QID, per pt request  Obesity, BMI 34 --she has been on diet and lost about 40 pounds since last year.  And she plans to continue to do so.   RA --cont hydroxychloroquine   DVT prophylaxis: Lovenox SQ Code Status: Full code  Family Communication:  Level of care: Med-Surg Dispo:   The patient is from: home Anticipated d/c is to: home vs SNF, pending further PT  eval Anticipated d/c date is: home tomorrow or if need SNF, then whenever bed available.   Subjective and Interval History:  Pt complained of pain in her right SI joint, which prevented her from putting weight on her right foot.     Objective: Vitals:   10/24/21 0347 10/24/21 0813 10/24/21 1801 10/24/21 1900  BP: (!) 113/95 (!) 158/78 (!) 147/78 (!) 176/87  Pulse: 82 75 77 75  Resp: '16 20 18 18  '$ Temp: 98.4 F (36.9 C) 98.1 F (36.7 C) 98.3 F (36.8 C) 98.6 F (37 C)  TempSrc: Oral Oral Oral   SpO2: 95% 97% 95% 92%  Weight:      Height:        Intake/Output Summary (Last 24 hours) at 10/24/2021 1902 Last data filed at 10/24/2021 0700 Gross per 24 hour  Intake --  Output 900 ml  Net -900 ml   Filed Weights   10/22/21 2319  Weight: 90.7 kg    Examination:   Constitutional: NAD, AAOx3 HEENT: conjunctivae and lids normal, EOMI CV: No cyanosis.   RESP: normal respiratory effort, on RA Neuro: II - XII grossly intact.   Psych: Normal mood and affect.  Appropriate judgement and reason   Data Reviewed: I have personally reviewed labs and imaging studies  Time spent: 50 minutes  Enzo Bi, MD [facility] If 7PM-7AM, please contact night-coverage 10/24/2021, 7:02 PM

## 2021-10-24 NOTE — Care Management Obs Status (Signed)
[place] NOTIFICATION   Patient Details  Name: Wanda Lang MRN: [id number] Date of Birth: 1950/06/01   Medicare Observation Status Notification Given:  Yes    Beverly Sessions, RN 10/24/2021, 2:57 PM

## 2021-10-25 DIAGNOSIS — R262 Difficulty in walking, not elsewhere classified: Secondary | ICD-10-CM | POA: Diagnosis not present

## 2021-10-25 DIAGNOSIS — M47818 Spondylosis without myelopathy or radiculopathy, sacral and sacrococcygeal region: Secondary | ICD-10-CM | POA: Diagnosis not present

## 2021-10-25 MED ORDER — BISACODYL 10 MG RE SUPP
10.0000 mg | Freq: Every day | RECTAL | Status: DC
  Administered 2021-10-25 – 2021-10-26 (×2): 10 mg via RECTAL
  Filled 2021-10-25 (×2): qty 1

## 2021-10-25 NOTE — TOC Progression Note (Addendum)
Transition of Care [facility]) - Progression Note    Patient Details  Name: Wanda Lang MRN: [id number] Date of Birth: [birth year]  Transition of Care [facility]) CM/SW Contact  Beverly Sessions, RN Phone Number: 10/25/2021, 2:42 PM  Clinical Narrative:    Josem Kaufmann still pending Patient and MD updated       Expected Discharge Plan and Services                                                 Social Determinants of Health (SDOH) Interventions    Readmission Risk Interventions     No data to display

## 2021-10-25 NOTE — Progress Notes (Signed)
[facility] [place] NAME: Wanda Lang    MR#:  [id number]  DATE OF BIRTH:  1950-06-28  SUBJECTIVE:  patient complaining of constipation. She is able to walk to the bathroom with walker. No family at bedside   VITALS:  Blood pressure (!) 179/82, pulse 73, temperature 98.7 F (37.1 C), temperature source Oral, resp. rate 16, height '5\' 4"'$  (1.626 m), weight 90.7 kg, SpO2 95 %.  PHYSICAL EXAMINATION:   GENERAL:  71 y.o.-year-old patient lying in the bed with no acute distress.  LUNGS: Normal breath sounds bilaterally, no wheezing, rales, rhonchi.  CARDIOVASCULAR: S1, S2 normal. No murmurs, rubs, or gallops.  ABDOMEN: Soft, nontender, nondistended. Bowel sounds present.  EXTREMITIES: No  edema b/l.    NEUROLOGIC: nonfocal  patient is alert and awake SKIN: No obvious rash, lesion, or ulcer.   LABORATORY PANEL:  CBC Recent Labs  Lab 10/23/21 0448  WBC 7.4  HGB 13.2  HCT 41.1  PLT 151    Chemistries  Recent Labs  Lab 10/23/21 0448  NA 142  K 4.0  CL 106  CO2 27  GLUCOSE 99  BUN 26*  CREATININE 0.82  CALCIUM 9.2   Cardiac Enzymes No results for input(s): "TROPONINI" in the last 168 hours. RADIOLOGY:  CT GUIDED NEEDLE PLACEMENT  Result Date: 10/24/2021 CLINICAL DATA:  Right hip pain, right SI joint pain EXAM: RIGHT CT GUIDED SI JOINT INJECTION RADIATION DOSE REDUCTION: This exam was performed according to the departmental dose-optimization program which includes automated exposure control, adjustment of the mA and/or kV according to patient size and/or use of iterative reconstruction technique. PROCEDURE: After a thorough discussion of risks and benefits of the procedure, including bleeding, infection, injury to nerves, blood vessels, and adjacent structures, verbal and written consent was obtained. Specific risks of the procedure included nondiagnostic/nontherapeutic injection and non target injection. The patient was placed  prone on the CT table and localization was performed over the sacrum. Target site marked using CT guidance. The skin was prepped and draped in the usual sterile fashion using Betadine soap. After local anesthesia with 1% lidocaine without epinephrine and subsequent deep anesthesia, a 22 gauge spinal needle was advanced into the right SI joint under intermittent CT guidance. Once the needle was in satisfactory position, representative image was captured with the needle demonstrated in the sacroiliac joint. Subsequently, 80 mg methylprednisolone mixed with 1 mL 1% lidocaine was injected into the right SI joint. Needles removed and a sterile dressing applied. No complications were observed. IMPRESSION: Successful CT-guided right SI joint steroid injection. Electronically Signed   By: Albin Felling M.D.   On: 10/24/2021 15:57    Assessment and Plan  Wanda Lang is a 71 y.o. female with medical history significant of lumbar stenosis L1-L2-L3, rheumatoid arthritis on DMARD,  SI joint arthritis status post cortisone/analgesic injection in late August 2023, OA status post right TKR, CAD, HTN, came with worsening of lower back pain.   Patient has chronic lower back pain secondary to severe right-sided SI joint arthritis underwent fluoroscopy guided injection by pain management in August this year.  She achieved some relief afterwards.  She was scheduled to follow-up in 5 to 6 weeks to have the next injection on 9/20.  Last week, she started to have worsening of right-sided back pain, this time pain shooting down to the side of lower thigh, and she developed cramping-like pain in her legs and she also noticed that she cannot  straighten her right leg because of pain.  As result, she has had difficulty to stand on her feet and therefore came to the ED.    Acute ambulation dysfunction 2/2 Right SI joint arthritis  --Pt was certain her pain is from her right SI joint and said what she needed was her scheduled SI  joint injection. --9/19--s/p IR for right SI joint injection of steroid. --PT recs rehab   Widespread lumbar spine degeneration Various degree of lumbar stenosis --cont oxycodone 5 mg PRN --cont Robaxin 500 mg PRN --schedule tylenol 1000 mg QID, per pt request   Obesity, BMI 34 --she has been on diet and lost about 40 pounds since last year.  And she plans to continue to do so.   RA --cont hydroxychloroquine     DVT prophylaxis: Lovenox SQ Code Status: Full code  Family Communication:  Level of care: Med-Surg Dispo:   The patient is from: home Anticipated d/c is to: SNF when bed available  Status is: Observation     TOTAL TIME TAKING CARE OF THIS PATIENT: 35 minutes.  >50% time spent on counselling and coordination of care  Note: This dictation was prepared with Dragon dictation along with smaller phrase technology. Any transcriptional errors that result from this process are unintentional.  Fritzi Mandes M.D    [facility]   CC: Primary care physician; Leone Haven, MD

## 2021-10-25 NOTE — TOC Progression Note (Signed)
Transition of Care [facility]) - Progression Note    Patient Details  Name: Wanda Lang MRN: [id number] Date of Birth: [birth year]  Transition of Care [facility]) CM/SW Contact  Beverly Sessions, RN Phone Number: 10/25/2021, 9:14 AM  Clinical Narrative:    Insurance auth still pending. Auth id [id number]        Expected Discharge Plan and Services                                                 Social Determinants of Health (SDOH) Interventions    Readmission Risk Interventions     No data to display

## 2021-10-25 NOTE — Progress Notes (Signed)
PT Cancellation Note  Patient Details Name: Wanda Lang MRN: [id number] DOB: [birth year]   Cancelled Treatment:    Reason Eval/Treat Not Completed: Other (comment). Per pt and RN pt just returning to bed from the bathroom. Declining mobility at the moment due to recently moving and elevated pain. PT to re-attempt as able.   Lieutenant Diego PT, DPT 9:12 AM,10/25/21

## 2021-10-25 NOTE — Progress Notes (Signed)
PIV consult: No current IV medications. For vessel preservation purposes, recommend waiting until IV med is ordered/ needed to place the best line for ordered therapy. Ammiana, RN made aware.

## 2021-10-25 NOTE — Plan of Care (Signed)
  Problem: Clinical Measurements: Goal: Ability to maintain clinical measurements within normal limits will improve Outcome: Progressing   Problem: Clinical Measurements: Goal: Will remain free from infection Outcome: Progressing   Problem: Clinical Measurements: Goal: Diagnostic test results will improve Outcome: Progressing   

## 2021-10-25 NOTE — Progress Notes (Signed)
Patient did not want a new IV started. MD aware.

## 2021-10-26 ENCOUNTER — Encounter: Payer: Self-pay | Admitting: Internal Medicine

## 2021-10-26 DIAGNOSIS — M47818 Spondylosis without myelopathy or radiculopathy, sacral and sacrococcygeal region: Secondary | ICD-10-CM

## 2021-10-26 DIAGNOSIS — M25551 Pain in right hip: Secondary | ICD-10-CM | POA: Diagnosis not present

## 2021-10-26 DIAGNOSIS — R2689 Other abnormalities of gait and mobility: Secondary | ICD-10-CM | POA: Diagnosis not present

## 2021-10-26 DIAGNOSIS — E669 Obesity, unspecified: Secondary | ICD-10-CM | POA: Diagnosis not present

## 2021-10-26 DIAGNOSIS — M48061 Spinal stenosis, lumbar region without neurogenic claudication: Secondary | ICD-10-CM | POA: Diagnosis not present

## 2021-10-26 DIAGNOSIS — M13851 Other specified arthritis, right hip: Secondary | ICD-10-CM | POA: Diagnosis not present

## 2021-10-26 DIAGNOSIS — L989 Disorder of the skin and subcutaneous tissue, unspecified: Secondary | ICD-10-CM | POA: Diagnosis not present

## 2021-10-26 DIAGNOSIS — Z96652 Presence of left artificial knee joint: Secondary | ICD-10-CM | POA: Diagnosis not present

## 2021-10-26 DIAGNOSIS — M545 Low back pain, unspecified: Secondary | ICD-10-CM | POA: Diagnosis not present

## 2021-10-26 DIAGNOSIS — I129 Hypertensive chronic kidney disease with stage 1 through stage 4 chronic kidney disease, or unspecified chronic kidney disease: Secondary | ICD-10-CM | POA: Diagnosis not present

## 2021-10-26 DIAGNOSIS — R278 Other lack of coordination: Secondary | ICD-10-CM | POA: Diagnosis not present

## 2021-10-26 DIAGNOSIS — R262 Difficulty in walking, not elsewhere classified: Secondary | ICD-10-CM | POA: Diagnosis not present

## 2021-10-26 DIAGNOSIS — K219 Gastro-esophageal reflux disease without esophagitis: Secondary | ICD-10-CM | POA: Diagnosis not present

## 2021-10-26 DIAGNOSIS — N390 Urinary tract infection, site not specified: Secondary | ICD-10-CM | POA: Diagnosis not present

## 2021-10-26 DIAGNOSIS — M069 Rheumatoid arthritis, unspecified: Secondary | ICD-10-CM | POA: Diagnosis not present

## 2021-10-26 DIAGNOSIS — I2510 Atherosclerotic heart disease of native coronary artery without angina pectoris: Secondary | ICD-10-CM | POA: Diagnosis not present

## 2021-10-26 DIAGNOSIS — Z79899 Other long term (current) drug therapy: Secondary | ICD-10-CM | POA: Diagnosis not present

## 2021-10-26 DIAGNOSIS — M6281 Muscle weakness (generalized): Secondary | ICD-10-CM | POA: Diagnosis not present

## 2021-10-26 DIAGNOSIS — M47817 Spondylosis without myelopathy or radiculopathy, lumbosacral region: Secondary | ICD-10-CM | POA: Diagnosis not present

## 2021-10-26 DIAGNOSIS — M461 Sacroiliitis, not elsewhere classified: Secondary | ICD-10-CM | POA: Diagnosis not present

## 2021-10-26 DIAGNOSIS — N189 Chronic kidney disease, unspecified: Secondary | ICD-10-CM | POA: Diagnosis not present

## 2021-10-26 LAB — URINALYSIS, COMPLETE (UACMP) WITH MICROSCOPIC
Bilirubin Urine: NEGATIVE
Glucose, UA: NEGATIVE mg/dL
Ketones, ur: 15 mg/dL — AB
Leukocytes,Ua: NEGATIVE
Nitrite: NEGATIVE
Protein, ur: NEGATIVE mg/dL
Specific Gravity, Urine: 1.02 (ref 1.005–1.030)
pH: 7 (ref 5.0–8.0)

## 2021-10-26 MED ORDER — OXYCODONE HCL 5 MG PO TABS: 5.0000 mg | ORAL_TABLET | Freq: Four times a day (QID) | ORAL | 0 refills | Status: DC | PRN

## 2021-10-26 NOTE — Plan of Care (Signed)

## 2021-10-26 NOTE — TOC Progression Note (Signed)
Transition of Care [facility]) - Progression Note    Patient Details  Name: Wanda Lang MRN: [id number] Date of Birth: April 29, 1950  [facility]) CM/SW Contact  Beverly Sessions, RN Phone Number: 10/26/2021, 8:53 AM  Clinical Narrative:     Josem Kaufmann still pending       Expected Discharge Plan and Services                                                 Social Determinants of Health (SDOH) Interventions    Readmission Risk Interventions     No data to display

## 2021-10-26 NOTE — Progress Notes (Signed)
Physical Therapy Treatment Patient Details Name: Wanda Lang MRN: [id number] DOB: Feb 16, 1950 Today's Date: 10/26/2021   History of Present Illness Pt is a 71 y.o. female with history of hypertension, chronic kidney disease, L TKA, CAD who presents to the emergency department for uncontrolled back pain and inability to care for herself.    PT Comments    Patient alert, agreeable to PT. Reported 3/10 back pain at rest, and then referenced R knee pain as well during ambulation. She was able to perform bed mobility with supervision to get out of bed, but did still need physical assistance to return to supine for RLE, but improved ability overall to lift against gravity. Pt continued to be limited in functional distances (ambulated ~25f with RW and CGA) as well as tasks due to increasing back pain, and expressed concern about managing at home independently. Recommendation at this time remains SNF to maximize mobility, endurance, as well as pain management.     Recommendations for follow up therapy are one component of a multi-disciplinary discharge planning process, led by the attending physician.  Recommendations may be updated based on patient status, additional functional criteria and insurance authorization.  Follow Up Recommendations  Skilled nursing-short term rehab (<3 hours/day) Can patient physically be transported by private vehicle: Yes   Assistance Recommended at Discharge Intermittent Supervision/Assistance  Patient can return home with the following A little help with walking and/or transfers;A little help with bathing/dressing/bathroom;Assistance with cooking/housework;Assist for transportation;Help with stairs or ramp for entrance   Equipment Recommendations  Rolling walker (2 wheels)    Recommendations for Other Services       Precautions / Restrictions Precautions Precautions: Fall Restrictions Weight Bearing Restrictions: No     Mobility  Bed Mobility Overal  bed mobility: Needs Assistance Bed Mobility: Supine to Sit, Sit to Supine     Supine to sit: Supervision Sit to supine: Min assist   General bed mobility comments: minA for RLE but improved from last session    Transfers Overall transfer level: Needs assistance Equipment used: Rolling walker (2 wheels) Transfers: Sit to/from Stand Sit to Stand: Supervision                Ambulation/Gait Ambulation/Gait assistance: Supervision Gait Distance (Feet): 90 Feet Assistive device: Rolling walker (2 wheels)         General Gait Details: improved tolerance today but continued pain   Stairs             Wheelchair Mobility    Modified Rankin (Stroke Patients Only)       Balance Overall balance assessment: Needs assistance Sitting-balance support: Feet supported Sitting balance-Leahy Scale: Good       Standing balance-Leahy Scale: Fair Standing balance comment: standing pericare with minA, reliant on at least unilateral UE support                            Cognition Arousal/Alertness: Awake/alert Behavior During Therapy: WFL for tasks assessed/performed Overall Cognitive Status: Within Functional Limits for tasks assessed                                          Exercises      General Comments        Pertinent Vitals/Pain Pain Assessment Pain Assessment: 0-10 Pain Score: 3  Pain Location: midline back pain, R knee pain  Pain Descriptors / Indicators: Aching, Grimacing, Guarding Pain Intervention(s): Limited activity within patient's tolerance, Monitored during session, Repositioned    Home Living                          Prior Function            PT Goals (current goals can now be found in the care plan section) Progress towards PT goals: Progressing toward goals    Frequency    Min 2X/week      PT Plan Current plan remains appropriate    Co-evaluation              AM-PAC PT "6  Clicks" Mobility   Outcome Measure  Help needed turning from your back to your side while in a flat bed without using bedrails?: A Little Help needed moving from lying on your back to sitting on the side of a flat bed without using bedrails?: A Little Help needed moving to and from a bed to a chair (including a wheelchair)?: A Little Help needed standing up from a chair using your arms (e.g., wheelchair or bedside chair)?: A Little Help needed to walk in hospital room?: A Little Help needed climbing 3-5 steps with a railing? : A Little 6 Click Score: 18    End of Session   Activity Tolerance: Patient tolerated treatment well Patient left: in bed;with call bell/phone within reach;with bed alarm set Nurse Communication: Mobility status PT Visit Diagnosis: Other abnormalities of gait and mobility (R26.89);Difficulty in walking, not elsewhere classified (R26.2);Muscle weakness (generalized) (M62.81);Pain Pain - Right/Left: Right Pain - part of body:  (back)     Time: 1045-1110 PT Time Calculation (min) (ACUTE ONLY): 25 min  Charges:  $Therapeutic Activity: 23-37 mins                    Lieutenant Diego PT, DPT 11:50 AM,10/26/21

## 2021-10-26 NOTE — TOC Transition Note (Signed)
Transition of Care [facility]) - CM/SW Discharge Note   Patient Details  Name: Wanda Lang MRN: [id number] Date of Birth: 05-Apr-1950  Transition of Care [facility]) CM/SW Contact:  Beverly Sessions, RN Phone Number: 10/26/2021, 1:25 PM   Clinical Narrative:     Peer to peer complete auth denied Fast track appeal information provided to patient.  She has appealed and I faxed clinical on her behalf  Patient has decided to private pay for 2 weeks at [facility].   Sharyn Lull at [facility] confirms payment has cleared and they can accept patient today  Patient will DC [facility] Anticipated DC date: 10/26/21 Transport IZ:TIWPYKD to arrange her own transportation  by private vehicle   Per MD patient ready for DC to . RN, patient, and facility notified of DC. Discharge Summary sent to facility. RN given number for report. DC packet on chart.  TOC signing off.  [name] [facility] [phone]         Patient Goals and CMS Choice        Discharge Placement                       Discharge Plan and Services                                     Social Determinants of Health (SDOH) Interventions     Readmission Risk Interventions     No data to display

## 2021-10-26 NOTE — Discharge Summary (Signed)
Physician Discharge Summary   Patient: Wanda Lang MRN: [id number] DOB: 1950/05/23  Admit date:     10/23/2021  Discharge date: 10/26/21  Discharge Physician: Fritzi Mandes   PCP: Leone Haven, MD   Recommendations at discharge:    F/u PCP in 1-2 weeks  Discharge Diagnoses: Right SI joint arthritis  Hospital Course: Wanda Lang is a 71 y.o. female with medical history significant of lumbar stenosis L1-L2-L3, rheumatoid arthritis on DMARD,  SI joint arthritis status post cortisone/analgesic injection in late August 2023, OA status post right TKR, CAD, HTN, came with worsening of lower back pain.   Patient has chronic lower back pain secondary to severe right-sided SI joint arthritis underwent fluoroscopy guided injection by pain management in August this year.  She achieved some relief afterwards.  She was scheduled to follow-up in 5 to 6 weeks to have the next injection on 9/20.  Last week, she started to have worsening of right-sided back pain, this time pain shooting down to the side of lower thigh, and she developed cramping-like pain in her legs and she also noticed that she cannot straighten her right leg because of pain.  As result, she has had difficulty to stand on her feet and therefore came to the ED.    Acute ambulation dysfunction 2/2 Right SI joint arthritis  --Pt was certain her pain is from her right SI joint and said what she needed was her scheduled SI joint injection. --9/19--s/p IR for right SI joint injection of steroid. --PT recs rehab--did peer to peer review and insurance declined rehab. Per TOC patient is in agreement with private pay for rehab.   Widespread lumbar spine degeneration Various degree of lumbar stenosis --cont oxycodone 5 mg PRN --cont Robaxin 500 mg PRN --schedule tylenol 1000 mg QID, per pt request   Obesity, BMI 34 --she has been on diet and lost about 40 pounds since last year.  And she plans to continue to do so.   RA --cont  hydroxychloroquine  Discharged to rehab today     DVT prophylaxis: Lovenox SQ Code Status: Full code  Family Communication: none Level of care: Med-Surg Dispo:   The patient is from: home Anticipated d/c is to: SNF  today     Pain control - [facility] Controlled Substance Reporting System database was reviewed. and patient was instructed, not to drive, operate heavy machinery, perform activities at heights, swimming or participation in water activities or provide baby-sitting services while on Pain, Sleep and Anxiety Medications; until their outpatient Physician has advised to do so again. Also recommended to not to take more than prescribed Pain, Sleep and Anxiety Medications.  Consultants: IR Procedures performed: Injection of right SI joint  Disposition: Rehabilitation facility Diet recommendation:  Discharge Diet Orders (From admission, onward)     Start     Ordered   10/26/21 0000  Diet - low sodium heart healthy        10/26/21 1206           Regular diet DISCHARGE MEDICATION: Allergies as of 10/26/2021       Reactions   Penicillins Itching   Tolerated Cephalosporin 07/18/21. Other reaction(s): wheezing   Amoxicillin Itching   Misc. Sulfonamide Containing Compounds    Rosanil Cleanser [sulfacetamide Sodium-sulfur] Other (See Comments)   Unknown reaction    Sulfonamide Derivatives    Unknown reaction    Ylang-ylang [cananga Oil (ylang-ylang)] Itching        Medication List  STOP taking these medications    Estradiol 10 MCG Tabs vaginal tablet   predniSONE 10 MG tablet Commonly known as: DELTASONE   traMADol 50 MG tablet Commonly known as: ULTRAM       TAKE these medications    acetaminophen 500 MG tablet Commonly known as: TYLENOL Take 1,000 mg by mouth every 6 (six) hours as needed.   CALCIUM PO Take 1,200 mg by mouth daily.   celecoxib 200 MG capsule Commonly known as: CELEBREX Take 1 capsule (200 mg total) by mouth 2 (two)  times daily. Do not start this medication until you finish the prednisone dose pack.   cholecalciferol 25 MCG (1000 UNIT) tablet Commonly known as: VITAMIN D3 Take 1,000 Units by mouth daily.   cycloSPORINE 0.05 % ophthalmic emulsion Commonly known as: RESTASIS Place 1 drop into both eyes 2 (two) times daily.   DSS 100 MG Caps Take 1 capsule by mouth 2 (two) times daily.   estradiol 0.05 MG/24HR patch Commonly known as: VIVELLE-DOT Place 1 patch onto the skin 2 (two) times a week.   Flaxseed Oil 1200 MG Caps Take 1,200 mg by mouth daily.   Glucosamine HCl 1000 MG Tabs Take 1,000 mg by mouth daily.   hydroxychloroquine 200 MG tablet Commonly known as: PLAQUENIL Take 200 mg by mouth 2 (two) times daily.   lidocaine 5 % Commonly known as: Lidoderm Place 1 patch onto the skin every 12 (twelve) hours. Remove & Discard patch within 12 hours or as directed by MD What changed: Another medication with the same name was removed. Continue taking this medication, and follow the directions you see here.   magnesium gluconate 500 MG tablet Commonly known as: MAGONATE Take 500 mg by mouth 3 (three) times a week.   methocarbamol 500 MG tablet Commonly known as: ROBAXIN Take 1 tablet by mouth every 6 (six) hours as needed.   mometasone 0.1 % cream Commonly known as: ELOCON Apply topically to affected areas behind ears and neck twice daily for itchy rash use for up to 2 weeks. Can also use at itchy areas at abdomen. What changed: Another medication with the same name was removed. Continue taking this medication, and follow the directions you see here.   OVER THE COUNTER MEDICATION Take 1 mL by mouth daily. CBD oil   oxyCODONE 5 MG immediate release tablet Commonly known as: Oxy IR/ROXICODONE Take 1 tablet (5 mg total) by mouth every 6 (six) hours as needed. What changed: when to take this   polyethylene glycol 17 g packet Commonly known as: MIRALAX / GLYCOLAX Take 17 g by mouth  daily as needed for mild constipation.   POTASSIUM PO Take 3 tablets by mouth daily.   pseudoephedrine 120 MG 12 hr tablet Commonly known as: SUDAFED Take 60-120 mg by mouth daily as needed for congestion.   TURMERIC CURCUMIN PO Take 1 capsule by mouth daily.        Contact information for follow-up providers     Leone Haven, MD. Schedule an appointment as soon as possible for a visit in 1 week(s).   Specialty: Family Medicine Why: hospital f/u Contact information: [address] [place] [address] [phone]              Contact information for after-discharge care     Destination     [facility] Preferred SNF .   Service: Skilled Chiropodist information: [address] [place] [phone]  Discharge Exam: Filed Weights   10/22/21 2319  Weight: 90.7 kg     Condition at discharge: fair  The results of significant diagnostics from this hospitalization (including imaging, microbiology, ancillary and laboratory) are listed below for reference.   Imaging Studies: CT GUIDED NEEDLE PLACEMENT  Result Date: 10/24/2021 CLINICAL DATA:  Right hip pain, right SI joint pain EXAM: RIGHT CT GUIDED SI JOINT INJECTION RADIATION DOSE REDUCTION: This exam was performed according to the departmental dose-optimization program which includes automated exposure control, adjustment of the mA and/or kV according to patient size and/or use of iterative reconstruction technique. PROCEDURE: After a thorough discussion of risks and benefits of the procedure, including bleeding, infection, injury to nerves, blood vessels, and adjacent structures, verbal and written consent was obtained. Specific risks of the procedure included nondiagnostic/nontherapeutic injection and non target injection. The patient was placed prone on the CT table and localization was performed over the sacrum. Target site  marked using CT guidance. The skin was prepped and draped in the usual sterile fashion using Betadine soap. After local anesthesia with 1% lidocaine without epinephrine and subsequent deep anesthesia, a 22 gauge spinal needle was advanced into the right SI joint under intermittent CT guidance. Once the needle was in satisfactory position, representative image was captured with the needle demonstrated in the sacroiliac joint. Subsequently, 80 mg methylprednisolone mixed with 1 mL 1% lidocaine was injected into the right SI joint. Needles removed and a sterile dressing applied. No complications were observed. IMPRESSION: Successful CT-guided right SI joint steroid injection. Electronically Signed   By: Albin Felling M.D.   On: 10/24/2021 15:57   MR Lumbar Spine W Wo Contrast  Result Date: 10/23/2021 CLINICAL DATA:  71 year old female with increasing low back pain radiating to the right leg. Unable to walk. Planned spinal injection this week. EXAM: MRI LUMBAR SPINE WITHOUT AND WITH CONTRAST TECHNIQUE: Multiplanar and multiecho pulse sequences of the lumbar spine were obtained without and with intravenous contrast. CONTRAST:  32m GADAVIST GADOBUTROL 1 MMOL/ML IV SOLN COMPARISON:  Fluoroscopic spot images from spine injection 08/09/2021. CT Abdomen 03/04/2007. FINDINGS: Segmentation: Suggestion of transitional lumbosacral anatomy on the 2009 CT. Difficult to ascertain the lowest rib level on these images. Lumbar segmentation will be designated as normal. Correlation with radiographs is recommended prior to any operative intervention. Alignment: Mild dextroconvex upper and mild to moderate levoconvex lower lumbar scoliosis. There is subtle grade 1 anterolisthesis at both L4-L5 and L5-S1. Similar mild retrolisthesis L1-L2. Vertebrae: No marrow edema or evidence of acute osseous abnormality. Some chronic degenerative endplate marrow signal changes. Normal background bone marrow signal. Intact visible sacrum and SI  joints. Conus medullaris and cauda equina: Conus extends to the T12-L1 level. No lower spinal cord or conus signal abnormality. In general the cauda equina nerve roots appear normal. There is no abnormal intradural enhancement. No dural thickening. Paraspinal and other soft tissues: Partially visible distended gallbladder. Negative visible other abdominal viscera. Negative visualized posterior paraspinal soft tissues. Disc levels: T11-T12: Negative. T12-L1: Mild disc desiccation and disc space loss. Minor disc bulging. No stenosis. L1-L2: Disc space loss with some evidence of vacuum disc. Circumferential disc bulging eccentric to the left. Left far lateral component. Mild to moderate facet and ligament flavum hypertrophy. No significant spinal stenosis. Mild left lateral recess stenosis (left L2 nerve level). No convincing foraminal stenosis. L2-L3: Disc space loss with some vacuum disc. Circumferential disc bulging with moderate facet and ligament flavum hypertrophy. Mild to moderate spinal stenosis. Relatively symmetric lateral recess  stenosis (L3 nerve levels). Asymmetric left foraminal disc. Mild to moderate left L2 foraminal stenosis. Mild if any right foraminal stenosis. L3-L4: Disc space loss. Circumferential but mostly far lateral disc bulging. Mild to moderate facet and ligament flavum hypertrophy. No spinal or lateral recess stenosis. Mild to moderate right L3 foraminal stenosis. L4-L5: Disc space loss. Vacuum disc. Mild anterolisthesis. Circumferential disc bulge. Moderate facet and moderate to severe ligament flavum hypertrophy. Borderline spinal stenosis. Mild left lateral recess stenosis (left L5 nerve level). Mild to moderate left L4 foraminal stenosis. L5-S1: Mild anterolisthesis. Mild disc space loss and circumferential disc bulge eccentric to the left. Mild to moderate facet and ligament flavum hypertrophy. No spinal stenosis. No convincing lateral recess or right foraminal stenosis. Mild to  moderate left foraminal stenosis in part related to left far lateral disc osteophyte complex. IMPRESSION: 1. Widespread lumbar spine degeneration with no acute osseous abnormality. Mild scoliosis and multilevel spondylolisthesis 2. Mild to moderate multifactorial spinal stenosis at L2-L3. Mild left lateral recess stenosis at L1-L2 and L4-L5. Up to moderate neural foraminal stenosis at the left L2, right L3, left L4 and left L5 nerve levels. 3. Partially visible distended gallbladder. Query right upper quadrant pain. Electronically Signed   By: Genevie Ann M.D.   On: 10/23/2021 06:39    Microbiology: Results for orders placed or performed during the hospital encounter of 10/06/21  Urine Culture     Status: Abnormal   Collection Time: 10/06/21  4:54 PM   Specimen: Urine, Clean Catch  Result Value Ref Range Status   Specimen Description URINE, CLEAN CATCH  Final   Special Requests   Final    NONE Performed at [facility], [place] [address], [place], [place] [address]    Culture >=100,000 COLONIES/mL ESCHERICHIA COLI (A)  Final   Report Status 10/09/2021 FINAL  Final   Organism ID, Bacteria ESCHERICHIA COLI (A)  Final      Susceptibility   Escherichia coli - MIC*    AMPICILLIN 8 SENSITIVE Sensitive     CEFAZOLIN <=4 SENSITIVE Sensitive     CEFEPIME <=0.12 SENSITIVE Sensitive     CEFTRIAXONE <=0.25 SENSITIVE Sensitive     CIPROFLOXACIN <=0.25 SENSITIVE Sensitive     GENTAMICIN <=1 SENSITIVE Sensitive     IMIPENEM <=0.25 SENSITIVE Sensitive     NITROFURANTOIN <=16 SENSITIVE Sensitive     TRIMETH/SULFA <=20 SENSITIVE Sensitive     AMPICILLIN/SULBACTAM 4 SENSITIVE Sensitive     PIP/TAZO <=4 SENSITIVE Sensitive     * >=100,000 COLONIES/mL ESCHERICHIA COLI    Labs: CBC: Recent Labs  Lab 10/23/21 0448  WBC 7.4  NEUTROABS 4.1  HGB 13.2  HCT 41.1  MCV 98.6  PLT 643   Basic Metabolic Panel: Recent Labs  Lab 10/23/21 0448  NA 142  K 4.0  CL 106  CO2 27  GLUCOSE 99  BUN 26*   CREATININE 0.82  CALCIUM 9.2   Liver Function Tests: No results for input(s): "AST", "ALT", "ALKPHOS", "BILITOT", "PROT", "ALBUMIN" in the last 168 hours. CBG: No results for input(s): "GLUCAP" in the last 168 hours.  Discharge time spent: greater than 30 minutes.  Signed: Fritzi Mandes, MD [facility] 10/26/2021

## 2021-10-26 NOTE — TOC Progression Note (Signed)
Transition of Care [facility]) - Progression Note    Patient Details  Name: Wanda Lang MRN: [id number] Date of Birth: 1950-12-17  Transition of Care [facility]) CM/SW Contact  Beverly Sessions, RN Phone Number: 10/26/2021, 10:57 AM  Clinical Narrative:     Notified by Everlene Balls that Peer to Peer is being requested by 3pm Today [phone] MD notified        Expected Discharge Plan and Services                                                 Social Determinants of Health (SDOH) Interventions    Readmission Risk Interventions     No data to display

## 2021-10-26 NOTE — Progress Notes (Signed)
Report was given to [facility] that will be getting pt at [facility] (room #102).

## 2021-10-27 ENCOUNTER — Encounter: Payer: Medicare PPO | Admitting: Physical Medicine & Rehabilitation

## 2021-10-27 DIAGNOSIS — M6281 Muscle weakness (generalized): Secondary | ICD-10-CM | POA: Diagnosis not present

## 2021-10-27 DIAGNOSIS — N189 Chronic kidney disease, unspecified: Secondary | ICD-10-CM | POA: Diagnosis not present

## 2021-10-27 DIAGNOSIS — M069 Rheumatoid arthritis, unspecified: Secondary | ICD-10-CM | POA: Diagnosis not present

## 2021-10-27 DIAGNOSIS — M13851 Other specified arthritis, right hip: Secondary | ICD-10-CM | POA: Diagnosis not present

## 2021-10-27 DIAGNOSIS — M48061 Spinal stenosis, lumbar region without neurogenic claudication: Secondary | ICD-10-CM | POA: Diagnosis not present

## 2021-10-30 DIAGNOSIS — N189 Chronic kidney disease, unspecified: Secondary | ICD-10-CM | POA: Diagnosis not present

## 2021-10-30 DIAGNOSIS — M13851 Other specified arthritis, right hip: Secondary | ICD-10-CM | POA: Diagnosis not present

## 2021-10-30 DIAGNOSIS — M069 Rheumatoid arthritis, unspecified: Secondary | ICD-10-CM | POA: Diagnosis not present

## 2021-10-30 DIAGNOSIS — M48061 Spinal stenosis, lumbar region without neurogenic claudication: Secondary | ICD-10-CM | POA: Diagnosis not present

## 2021-10-30 DIAGNOSIS — M6281 Muscle weakness (generalized): Secondary | ICD-10-CM | POA: Diagnosis not present

## 2021-10-30 DIAGNOSIS — L989 Disorder of the skin and subcutaneous tissue, unspecified: Secondary | ICD-10-CM | POA: Diagnosis not present

## 2021-10-31 ENCOUNTER — Ambulatory Visit: Payer: Medicare PPO | Admitting: Family Medicine

## 2021-10-31 DIAGNOSIS — K219 Gastro-esophageal reflux disease without esophagitis: Secondary | ICD-10-CM | POA: Diagnosis not present

## 2021-10-31 DIAGNOSIS — M48061 Spinal stenosis, lumbar region without neurogenic claudication: Secondary | ICD-10-CM | POA: Diagnosis not present

## 2021-10-31 DIAGNOSIS — N189 Chronic kidney disease, unspecified: Secondary | ICD-10-CM | POA: Diagnosis not present

## 2021-10-31 DIAGNOSIS — M6281 Muscle weakness (generalized): Secondary | ICD-10-CM | POA: Diagnosis not present

## 2021-10-31 DIAGNOSIS — M069 Rheumatoid arthritis, unspecified: Secondary | ICD-10-CM | POA: Diagnosis not present

## 2021-10-31 DIAGNOSIS — M13851 Other specified arthritis, right hip: Secondary | ICD-10-CM | POA: Diagnosis not present

## 2021-10-31 DIAGNOSIS — I2510 Atherosclerotic heart disease of native coronary artery without angina pectoris: Secondary | ICD-10-CM | POA: Diagnosis not present

## 2021-11-01 DIAGNOSIS — M48061 Spinal stenosis, lumbar region without neurogenic claudication: Secondary | ICD-10-CM | POA: Diagnosis not present

## 2021-11-01 DIAGNOSIS — M13851 Other specified arthritis, right hip: Secondary | ICD-10-CM | POA: Diagnosis not present

## 2021-11-01 DIAGNOSIS — M069 Rheumatoid arthritis, unspecified: Secondary | ICD-10-CM | POA: Diagnosis not present

## 2021-11-01 DIAGNOSIS — M6281 Muscle weakness (generalized): Secondary | ICD-10-CM | POA: Diagnosis not present

## 2021-11-01 DIAGNOSIS — N189 Chronic kidney disease, unspecified: Secondary | ICD-10-CM | POA: Diagnosis not present

## 2021-11-01 DIAGNOSIS — L989 Disorder of the skin and subcutaneous tissue, unspecified: Secondary | ICD-10-CM | POA: Diagnosis not present

## 2021-11-03 DIAGNOSIS — M069 Rheumatoid arthritis, unspecified: Secondary | ICD-10-CM | POA: Diagnosis not present

## 2021-11-03 DIAGNOSIS — M6281 Muscle weakness (generalized): Secondary | ICD-10-CM | POA: Diagnosis not present

## 2021-11-03 DIAGNOSIS — L989 Disorder of the skin and subcutaneous tissue, unspecified: Secondary | ICD-10-CM | POA: Diagnosis not present

## 2021-11-03 DIAGNOSIS — M48061 Spinal stenosis, lumbar region without neurogenic claudication: Secondary | ICD-10-CM | POA: Diagnosis not present

## 2021-11-03 DIAGNOSIS — N189 Chronic kidney disease, unspecified: Secondary | ICD-10-CM | POA: Diagnosis not present

## 2021-11-03 DIAGNOSIS — M13851 Other specified arthritis, right hip: Secondary | ICD-10-CM | POA: Diagnosis not present

## 2021-11-06 ENCOUNTER — Telehealth: Payer: Self-pay | Admitting: Family Medicine

## 2021-11-06 DIAGNOSIS — M2170 Unequal limb length (acquired), unspecified site: Secondary | ICD-10-CM | POA: Insufficient documentation

## 2021-11-06 DIAGNOSIS — M5451 Vertebrogenic low back pain: Secondary | ICD-10-CM | POA: Diagnosis not present

## 2021-11-06 DIAGNOSIS — M533 Sacrococcygeal disorders, not elsewhere classified: Secondary | ICD-10-CM | POA: Diagnosis not present

## 2021-11-06 NOTE — Telephone Encounter (Signed)
Verbal orders can be given. 

## 2021-11-06 NOTE — Telephone Encounter (Signed)
Wanda Lang from [facility] calling for orders for physical Therapy 1 wk 4 and 1 every other week 4.

## 2021-11-06 NOTE — Telephone Encounter (Signed)
Verbal orders given on VM for this patient.  Haydin Dunn,cma

## 2021-11-08 DIAGNOSIS — M1711 Unilateral primary osteoarthritis, right knee: Secondary | ICD-10-CM | POA: Diagnosis not present

## 2021-11-08 DIAGNOSIS — M25561 Pain in right knee: Secondary | ICD-10-CM | POA: Diagnosis not present

## 2021-11-13 ENCOUNTER — Encounter: Payer: Self-pay | Admitting: Family Medicine

## 2021-11-13 ENCOUNTER — Ambulatory Visit: Payer: Medicare PPO | Admitting: Family Medicine

## 2021-11-13 DIAGNOSIS — M47818 Spondylosis without myelopathy or radiculopathy, sacral and sacrococcygeal region: Secondary | ICD-10-CM | POA: Diagnosis not present

## 2021-11-13 DIAGNOSIS — Z6834 Body mass index (BMI) 34.0-34.9, adult: Secondary | ICD-10-CM

## 2021-11-13 DIAGNOSIS — Z9181 History of falling: Secondary | ICD-10-CM

## 2021-11-13 DIAGNOSIS — G8929 Other chronic pain: Secondary | ICD-10-CM | POA: Diagnosis not present

## 2021-11-13 DIAGNOSIS — M461 Sacroiliitis, not elsewhere classified: Secondary | ICD-10-CM | POA: Diagnosis not present

## 2021-11-13 DIAGNOSIS — K219 Gastro-esophageal reflux disease without esophagitis: Secondary | ICD-10-CM | POA: Diagnosis not present

## 2021-11-13 DIAGNOSIS — M48061 Spinal stenosis, lumbar region without neurogenic claudication: Secondary | ICD-10-CM | POA: Diagnosis not present

## 2021-11-13 DIAGNOSIS — R011 Cardiac murmur, unspecified: Secondary | ICD-10-CM | POA: Diagnosis not present

## 2021-11-13 DIAGNOSIS — E669 Obesity, unspecified: Secondary | ICD-10-CM

## 2021-11-13 DIAGNOSIS — M13851 Other specified arthritis, right hip: Secondary | ICD-10-CM | POA: Diagnosis not present

## 2021-11-13 DIAGNOSIS — Z791 Long term (current) use of non-steroidal anti-inflammatories (NSAID): Secondary | ICD-10-CM

## 2021-11-13 DIAGNOSIS — Z780 Asymptomatic menopausal state: Secondary | ICD-10-CM | POA: Diagnosis not present

## 2021-11-13 DIAGNOSIS — M1991 Primary osteoarthritis, unspecified site: Secondary | ICD-10-CM | POA: Insufficient documentation

## 2021-11-13 DIAGNOSIS — I2510 Atherosclerotic heart disease of native coronary artery without angina pectoris: Secondary | ICD-10-CM | POA: Diagnosis not present

## 2021-11-13 DIAGNOSIS — Z96651 Presence of right artificial knee joint: Secondary | ICD-10-CM

## 2021-11-13 DIAGNOSIS — M069 Rheumatoid arthritis, unspecified: Secondary | ICD-10-CM | POA: Diagnosis not present

## 2021-11-13 DIAGNOSIS — L989 Disorder of the skin and subcutaneous tissue, unspecified: Secondary | ICD-10-CM

## 2021-11-13 DIAGNOSIS — N189 Chronic kidney disease, unspecified: Secondary | ICD-10-CM | POA: Diagnosis not present

## 2021-11-13 DIAGNOSIS — M0589 Other rheumatoid arthritis with rheumatoid factor of multiple sites: Secondary | ICD-10-CM | POA: Diagnosis not present

## 2021-11-13 DIAGNOSIS — I129 Hypertensive chronic kidney disease with stage 1 through stage 4 chronic kidney disease, or unspecified chronic kidney disease: Secondary | ICD-10-CM | POA: Diagnosis not present

## 2021-11-13 NOTE — Assessment & Plan Note (Signed)
Continue to follow with cardiology for this.  I think a follow-up echo in the spring is reasonable.

## 2021-11-13 NOTE — Assessment & Plan Note (Signed)
Counseled on risk of hormone replacement therapy including stroke, heart attack, breast cancer, and blood clot.  I encouraged her to discuss whether or not she needs to remain on this with her gynecologist.

## 2021-11-13 NOTE — Assessment & Plan Note (Signed)
This is causing her pain.  She is status post injection.  She will see the [facility] for custom orthotics.  She will continue to see physical medicine and rehab.  She will continue to see her orthopedist as well.  Orders for physical therapy and Occupational Therapy signed.

## 2021-11-13 NOTE — Progress Notes (Signed)
[name], MD Phone: [phone]  Wanda Lang is a 71 y.o. female who presents today for follow-up.  SI joint pain: Patient ended up in the hospital related to low back pain that was interfering with her ambulation.  She notes getting SI joint injections that did not help a lot.  She has been on oxycodone 5 mg 4 times daily.  She has been taking MiraLAX and Colace to prevent with constipation related to that.  She notes her pain is 5/10 now.  She feels as though her right leg is shorter than her left leg and notes this was confirmed by PT when she went to rehab.  She has been wearing an orthotic on the outside of her shoe to help with this.  She is going to the [facility] in the near future to have custom orthotics done.  She is also seeing physical medicine and rehab for this issue.  She is going to be doing home health physical therapy and Occupational Therapy.  Rheumatoid arthritis: Continues on Plaquenil.  She reports she sees ophthalmology consistently.  She was on Orencia previously though had to take a break from this relating to having a UTI and being on antibiotics previously.  Postmenopausal estrogen deficiency: Patient has been on the estrogen Vivelle-Dot.  Gynecology is managing this.  She is status post BSO and hysterectomy.  Heart murmur: She follows with cardiology.  She notes their plan on doing an echo in the spring.  Social History   Tobacco Use  Smoking Status Never  Smokeless Tobacco Never    Current Outpatient Medications on File Prior to Visit  Medication Sig Dispense Refill   acetaminophen (TYLENOL) 500 MG tablet Take 1,000 mg by mouth every 6 (six) hours as needed.     CALCIUM PO Take 1,200 mg by mouth daily.     celecoxib (CELEBREX) 200 MG capsule Take 1 capsule (200 mg total) by mouth 2 (two) times daily. Do not start this medication until you finish the prednisone dose pack. 60 capsule 0   cholecalciferol (VITAMIN D3) 25 MCG (1000 UNIT) tablet Take  1,000 Units by mouth daily.     cycloSPORINE (RESTASIS) 0.05 % ophthalmic emulsion Place 1 drop into both eyes 2 (two) times daily.     Docusate Sodium (DSS) 100 MG CAPS Take 1 capsule by mouth 2 (two) times daily.     estradiol (VIVELLE-DOT) 0.05 MG/24HR patch Place 1 patch onto the skin 2 (two) times a week.     Flaxseed, Linseed, (FLAXSEED OIL) 1200 MG CAPS Take 1,200 mg by mouth daily.     Glucosamine HCl 1000 MG TABS Take 1,000 mg by mouth daily.     hydroxychloroquine (PLAQUENIL) 200 MG tablet Take 200 mg by mouth 2 (two) times daily.     lidocaine (LIDODERM) 5 % Place 1 patch onto the skin every 12 (twelve) hours. Remove & Discard patch within 12 hours or as directed by MD 10 patch 1   magnesium gluconate (MAGONATE) 500 MG tablet Take 500 mg by mouth 3 (three) times a week.     methocarbamol (ROBAXIN) 500 MG tablet Take 1 tablet by mouth every 6 (six) hours as needed.     mometasone (ELOCON) 0.1 % cream Apply topically to affected areas behind ears and neck twice daily for itchy rash use for up to 2 weeks. Can also use at itchy areas at abdomen. 50 g 1   OVER THE COUNTER MEDICATION Take 1 mL by mouth daily. CBD  oil     oxyCODONE (OXY IR/ROXICODONE) 5 MG immediate release tablet Take 1 tablet (5 mg total) by mouth every 6 (six) hours as needed. 10 tablet 0   polyethylene glycol (MIRALAX / GLYCOLAX) 17 g packet Take 17 g by mouth daily as needed for mild constipation. 14 each 0   POTASSIUM PO Take 3 tablets by mouth daily.     pseudoephedrine (SUDAFED) 120 MG 12 hr tablet Take 60-120 mg by mouth daily as needed for congestion.     TURMERIC CURCUMIN PO Take 1 capsule by mouth daily.     No current facility-administered medications on file prior to visit.     ROS see history of present illness  Objective  Physical Exam Vitals:   11/13/21 1110  BP: 120/70  Pulse: 82  Temp: 98.6 F (37 C)  SpO2: 99%    BP Readings from Last 3 Encounters:  11/13/21 120/70  10/26/21 120/67   10/16/21 122/70   Wt Readings from Last 3 Encounters:  11/13/21 201 lb 12.8 oz (91.5 kg)  10/22/21 200 lb (90.7 kg)  10/16/21 203 lb 3.2 oz (92.2 kg)    Physical Exam Constitutional:      General: She is not in acute distress.    Appearance: She is not diaphoretic.  Cardiovascular:     Rate and Rhythm: Normal rate and regular rhythm.     Heart sounds: Murmur (2/6 systolic) heard.  Pulmonary:     Effort: Pulmonary effort is normal.     Breath sounds: Normal breath sounds.  Skin:    General: Skin is warm and dry.  Neurological:     Mental Status: She is alert.      Assessment/Plan: Please see individual problem list.  Problem List Items Addressed This Visit     Rheumatoid arthritis ([place]) (Chronic)    She will continue to see rheumatology.  I did encourage her to continue to see ophthalmology consistently given that she is on Plaquenil.      SI joint arthritis (Chronic)    This is causing her pain.  She is status post injection.  She will see the [facility] for custom orthotics.  She will continue to see physical medicine and rehab.  She will continue to see her orthopedist as well.  Orders for physical therapy and Occupational Therapy signed.      Heart murmur    Continue to follow with cardiology for this.  I think a follow-up echo in the spring is reasonable.      Postmenopausal estrogen deficiency    Counseled on risk of hormone replacement therapy including stroke, heart attack, breast cancer, and blood clot.  I encouraged her to discuss whether or not she needs to remain on this with her gynecologist.       Return in about 6 months (around 05/15/2022).   [name], MD [place]

## 2021-11-13 NOTE — Assessment & Plan Note (Signed)
She will continue to see rheumatology.  I did encourage her to continue to see ophthalmology consistently given that she is on Plaquenil.

## 2021-11-15 DIAGNOSIS — M1711 Unilateral primary osteoarthritis, right knee: Secondary | ICD-10-CM | POA: Diagnosis not present

## 2021-11-23 DIAGNOSIS — M1711 Unilateral primary osteoarthritis, right knee: Secondary | ICD-10-CM | POA: Diagnosis not present

## 2021-11-24 ENCOUNTER — Telehealth: Payer: Self-pay

## 2021-11-24 NOTE — Telephone Encounter (Signed)
Spoke with [facility] nurse.  Verbal order for UA and culture given.

## 2021-11-24 NOTE — Telephone Encounter (Signed)
Amy is calling from [facility] to request verbal orders for urine specimen for possible UTI for patient.

## 2021-11-24 NOTE — Telephone Encounter (Signed)
I transferred call to Cordelia Pen, RN, for verbal order confirmation.

## 2021-11-30 DIAGNOSIS — Z20828 Contact with and (suspected) exposure to other viral communicable diseases: Secondary | ICD-10-CM | POA: Diagnosis not present

## 2021-12-01 ENCOUNTER — Ambulatory Visit: Payer: Medicare PPO | Admitting: Family Medicine

## 2021-12-04 DIAGNOSIS — Z5181 Encounter for therapeutic drug level monitoring: Secondary | ICD-10-CM | POA: Diagnosis not present

## 2021-12-04 DIAGNOSIS — Z79899 Other long term (current) drug therapy: Secondary | ICD-10-CM | POA: Diagnosis not present

## 2021-12-04 DIAGNOSIS — M5416 Radiculopathy, lumbar region: Secondary | ICD-10-CM | POA: Diagnosis not present

## 2021-12-04 DIAGNOSIS — M5136 Other intervertebral disc degeneration, lumbar region: Secondary | ICD-10-CM | POA: Diagnosis not present

## 2021-12-04 DIAGNOSIS — M2170 Unequal limb length (acquired), unspecified site: Secondary | ICD-10-CM | POA: Diagnosis not present

## 2021-12-04 DIAGNOSIS — M533 Sacrococcygeal disorders, not elsewhere classified: Secondary | ICD-10-CM | POA: Diagnosis not present

## 2021-12-04 DIAGNOSIS — M545 Low back pain, unspecified: Secondary | ICD-10-CM | POA: Diagnosis not present

## 2021-12-05 ENCOUNTER — Telehealth: Payer: Self-pay | Admitting: *Deleted

## 2021-12-05 DIAGNOSIS — I10 Essential (primary) hypertension: Secondary | ICD-10-CM

## 2021-12-05 NOTE — Patient Outreach (Signed)
  Care Coordination   Initial Visit Note   12/05/2021 Name: Wanda Lang MRN: [id number] DOB: [birth year]  Wanda Lang is a 71 y.o. year old female who sees Caryl Bis, Angela Adam, MD for primary care. I spoke with  Simeon Craft by phone today.  What matters to the patients health and wellness today?  I need transportation to my Dr visits.     Goals Addressed   None     SDOH assessments and interventions completed:  Yes  SDOH Interventions Today    Flowsheet Row Most Recent Value  SDOH Interventions   Food Insecurity Interventions Intervention Not Indicated  Housing Interventions Intervention Not Indicated  Transportation Interventions Ambulatory [id number] Order        Care Coordination Interventions Activated:  Yes Care coordination program/ services discussed  Social determinants of health survey completed   Care Coordination Interventions:  Yes, provided   Follow up plan: Referral made to care guide for transportation    Encounter Outcome:  Pt. Visit Completed   [facility] [phone]

## 2021-12-06 ENCOUNTER — Telehealth: Payer: Self-pay | Admitting: Family Medicine

## 2021-12-06 ENCOUNTER — Telehealth: Payer: Self-pay

## 2021-12-06 NOTE — Telephone Encounter (Signed)
   Telephone encounter was:  Successful.  12/06/2021 Name: Wanda Lang MRN: [id number] DOB: 06-12-1950  AMARILYS LYLES is a 71 y.o. year old female who is a primary care patient of Caryl Bis, Nixie Laube Adam, MD . The community resource team was consulted for assistance with Transportation Needs   Care guide performed the following interventions: Patient provided with information about care guide support team and interviewed to confirm resource needs.Patient has transportation benefits availble with [employer] 12 trips and ride is set up for 11/9 at 2:15  Follow Up Plan:  No further follow up planned at this time. The patient has been provided with needed resources.    [place], Care Management  [phone] [address], [place], [place] [address] Phone: [phone] Email: [email or web address]

## 2021-12-06 NOTE — Telephone Encounter (Signed)
Pt called stating she has not heard back from her lab results

## 2021-12-08 NOTE — Telephone Encounter (Signed)
LVM for patient to call back.   Wilburn Keir,cma  

## 2021-12-11 DIAGNOSIS — M0589 Other rheumatoid arthritis with rheumatoid factor of multiple sites: Secondary | ICD-10-CM | POA: Diagnosis not present

## 2021-12-11 NOTE — Telephone Encounter (Signed)
Noted  

## 2021-12-11 NOTE — Telephone Encounter (Signed)
Patient stated the uri e she gave to them last week, the cup got broken in the car is what the home health nurse stated so they will be collecting another urine sample soon.  She stated he symptoms have actually improved.  She is waiting to hear form home health.  Kelty Szafran,cma

## 2021-12-11 NOTE — Telephone Encounter (Signed)
Noted. Did we receive ay labs from the home health company? She might have had a urine collected. If we did not receive them please call her home health company and see if we can get the results. Thanks.

## 2021-12-11 NOTE — Telephone Encounter (Signed)
I spoke with the patient and Home health nurse did the labs and the patient was confused so it is handled by them.  Wanda Lang,cma

## 2021-12-14 DIAGNOSIS — I350 Nonrheumatic aortic (valve) stenosis: Secondary | ICD-10-CM | POA: Diagnosis not present

## 2021-12-14 DIAGNOSIS — R0609 Other forms of dyspnea: Secondary | ICD-10-CM | POA: Diagnosis not present

## 2021-12-14 DIAGNOSIS — I10 Essential (primary) hypertension: Secondary | ICD-10-CM | POA: Diagnosis not present

## 2021-12-14 DIAGNOSIS — Z23 Encounter for immunization: Secondary | ICD-10-CM | POA: Diagnosis not present

## 2021-12-15 DIAGNOSIS — M549 Dorsalgia, unspecified: Secondary | ICD-10-CM | POA: Diagnosis not present

## 2021-12-15 DIAGNOSIS — Z79899 Other long term (current) drug therapy: Secondary | ICD-10-CM | POA: Diagnosis not present

## 2021-12-15 DIAGNOSIS — M2140 Flat foot [pes planus] (acquired), unspecified foot: Secondary | ICD-10-CM | POA: Diagnosis not present

## 2021-12-15 DIAGNOSIS — M25561 Pain in right knee: Secondary | ICD-10-CM | POA: Diagnosis not present

## 2021-12-15 DIAGNOSIS — J302 Other seasonal allergic rhinitis: Secondary | ICD-10-CM | POA: Diagnosis not present

## 2021-12-15 DIAGNOSIS — M150 Primary generalized (osteo)arthritis: Secondary | ICD-10-CM | POA: Diagnosis not present

## 2021-12-15 DIAGNOSIS — M0589 Other rheumatoid arthritis with rheumatoid factor of multiple sites: Secondary | ICD-10-CM | POA: Diagnosis not present

## 2021-12-15 DIAGNOSIS — M1710 Unilateral primary osteoarthritis, unspecified knee: Secondary | ICD-10-CM | POA: Diagnosis not present

## 2021-12-16 DIAGNOSIS — M5416 Radiculopathy, lumbar region: Secondary | ICD-10-CM | POA: Diagnosis not present

## 2021-12-21 DIAGNOSIS — I350 Nonrheumatic aortic (valve) stenosis: Secondary | ICD-10-CM | POA: Diagnosis not present

## 2022-01-02 DIAGNOSIS — M5136 Other intervertebral disc degeneration, lumbar region: Secondary | ICD-10-CM | POA: Diagnosis not present

## 2022-01-02 DIAGNOSIS — M5416 Radiculopathy, lumbar region: Secondary | ICD-10-CM | POA: Diagnosis not present

## 2022-01-02 DIAGNOSIS — M2170 Unequal limb length (acquired), unspecified site: Secondary | ICD-10-CM | POA: Diagnosis not present

## 2022-01-02 DIAGNOSIS — M533 Sacrococcygeal disorders, not elsewhere classified: Secondary | ICD-10-CM | POA: Diagnosis not present

## 2022-01-02 DIAGNOSIS — M545 Low back pain, unspecified: Secondary | ICD-10-CM | POA: Diagnosis not present

## 2022-01-03 DIAGNOSIS — M533 Sacrococcygeal disorders, not elsewhere classified: Secondary | ICD-10-CM | POA: Diagnosis not present

## 2022-01-05 ENCOUNTER — Ambulatory Visit: Payer: Medicare PPO | Admitting: Internal Medicine

## 2022-01-05 ENCOUNTER — Encounter: Payer: Self-pay | Admitting: Internal Medicine

## 2022-01-05 VITALS — BP 130/78 | HR 81 | Temp 97.8°F | Ht 64.0 in | Wt 200.8 lb

## 2022-01-05 DIAGNOSIS — R350 Frequency of micturition: Secondary | ICD-10-CM

## 2022-01-05 DIAGNOSIS — R634 Abnormal weight loss: Secondary | ICD-10-CM | POA: Diagnosis not present

## 2022-01-05 DIAGNOSIS — R300 Dysuria: Secondary | ICD-10-CM

## 2022-01-05 DIAGNOSIS — E782 Mixed hyperlipidemia: Secondary | ICD-10-CM

## 2022-01-05 LAB — URINALYSIS, ROUTINE W REFLEX MICROSCOPIC
Bilirubin Urine: NEGATIVE
Hgb urine dipstick: NEGATIVE
Ketones, ur: NEGATIVE
Nitrite: POSITIVE — AB
RBC / HPF: NONE SEEN (ref 0–?)
Specific Gravity, Urine: >=1.03 — AB (ref 1.000–1.030)
Total Protein, Urine: NEGATIVE
Urine Glucose: NEGATIVE
Urobilinogen, UA: 0.2 (ref 0.0–1.0)
pH: 6 (ref 5.0–8.0)

## 2022-01-05 LAB — POCT URINALYSIS DIP (CLINITEK)
Bilirubin, UA: NEGATIVE
Blood, UA: NEGATIVE
Glucose, UA: NEGATIVE mg/dL
Nitrite, UA: POSITIVE — AB
Spec Grav, UA: >=1.03 — AB (ref 1.010–1.025)
Urobilinogen, UA: 0.2 E.U./dL
pH, UA: 5.5 (ref 5.0–8.0)

## 2022-01-05 LAB — COMPREHENSIVE METABOLIC PANEL
ALT: 8 U/L (ref 0–35)
AST: 15 U/L (ref 0–37)
Albumin: 4 g/dL (ref 3.5–5.2)
Alkaline Phosphatase: 62 U/L (ref 39–117)
BUN: 23 mg/dL (ref 6–23)
CO2: 30 mEq/L (ref 19–32)
Calcium: 9 mg/dL (ref 8.4–10.5)
Chloride: 105 mEq/L (ref 96–112)
Creatinine, Ser: 0.6 mg/dL (ref 0.40–1.20)
GFR: 90.5 mL/min (ref >60.00)
Glucose, Bld: 80 mg/dL (ref 70–99)
Potassium: 4 mEq/L (ref 3.5–5.1)
Sodium: 142 mEq/L (ref 135–145)
Total Bilirubin: 0.5 mg/dL (ref 0.2–1.2)
Total Protein: 6.4 g/dL (ref 6.0–8.3)

## 2022-01-05 LAB — TSH: TSH: 2.31 u[IU]/mL (ref 0.35–5.50)

## 2022-01-05 NOTE — Progress Notes (Signed)
Subjective:  Patient ID: Wanda Lang, female    DOB: 12/08/1950  Age: 71 y.o. MRN: [id number]  CC: There were no encounter diagnoses.   HPI RYKA BEIGHLEY presents for  Chief Complaint  Patient presents with   Cystitis   71 yr old female presents with 1) Urinary incontinence with slight burning at end void,  symptoms have been recurrent/intermittent for the past 8 months.  Worse after knee surgery because of urge incontinence aggravated by slowed walking . Makes it a point to void every time she is up.  Having nocturia with urge incontinence at night . Wearing depends and a pad   Came home from rehab in September with home health.  Repeated attempts to collect a urine by [facility] RN were unsuccesful due to incompetence.  Has been taking oxycodone daily since her knee surgery because of worsening back pain caused by iatrogenic leg length disceipancy  Menopause:  taking estrogen patch and using estrogen vaginal tablets for several years.  S.p TAH/BSO in 2013 due to endometrial thickening and maternal history of DES use   No history of back surgery but has lumbar spinal stenosis with right sided sciatica.  Denies saddle paresthesia and bowel incontinence. Constipation from oxycodone managed with colace.   2) 60 lb weight loss since January 2023 (intentional) using the [facility]     Outpatient Medications Prior to Visit  Medication Sig Dispense Refill   acetaminophen (TYLENOL) 500 MG tablet Take 1,000 mg by mouth every 6 (six) hours as needed.     CALCIUM PO Take 1,200 mg by mouth daily.     celecoxib (CELEBREX) 200 MG capsule Take 1 capsule (200 mg total) by mouth 2 (two) times daily. Do not start this medication until you finish the prednisone dose pack. 60 capsule 0   cholecalciferol (VITAMIN D3) 25 MCG (1000 UNIT) tablet Take 1,000 Units by mouth daily.     cycloSPORINE (RESTASIS) 0.05 % ophthalmic emulsion Place 1 drop into both eyes 2 (two) times daily.      Docusate Sodium (DSS) 100 MG CAPS Take 1 capsule by mouth 2 (two) times daily.     estradiol (VIVELLE-DOT) 0.05 MG/24HR patch Place 1 patch onto the skin 2 (two) times a week.     Flaxseed, Linseed, (FLAXSEED OIL) 1200 MG CAPS Take 1,200 mg by mouth daily.     Glucosamine HCl 1000 MG TABS Take 1,000 mg by mouth daily.     hydroxychloroquine (PLAQUENIL) 200 MG tablet Take 200 mg by mouth 2 (two) times daily.     lidocaine (LIDODERM) 5 % Place 1 patch onto the skin every 12 (twelve) hours. Remove & Discard patch within 12 hours or as directed by MD 10 patch 1   magnesium gluconate (MAGONATE) 500 MG tablet Take 500 mg by mouth 3 (three) times a week.     methocarbamol (ROBAXIN) 500 MG tablet Take 1 tablet by mouth every 6 (six) hours as needed.     mometasone (ELOCON) 0.1 % cream Apply topically to affected areas behind ears and neck twice daily for itchy rash use for up to 2 weeks. Can also use at itchy areas at abdomen. 50 g 1   OVER THE COUNTER MEDICATION Take 1 mL by mouth daily. CBD oil     oxyCODONE (OXY IR/ROXICODONE) 5 MG immediate release tablet Take 1 tablet (5 mg total) by mouth every 6 (six) hours as needed. 10 tablet 0   polyethylene glycol (MIRALAX / GLYCOLAX)  17 g packet Take 17 g by mouth daily as needed for mild constipation. 14 each 0   POTASSIUM PO Take 3 tablets by mouth daily.     pseudoephedrine (SUDAFED) 120 MG 12 hr tablet Take 60-120 mg by mouth daily as needed for congestion.     TURMERIC CURCUMIN PO Take 1 capsule by mouth daily.     No facility-administered medications prior to visit.    Review of Systems;  Patient denies headache, fevers, malaise, unintentional weight loss, skin rash, eye pain, sinus congestion and sinus pain, sore throat, dysphagia,  hemoptysis , cough, dyspnea, wheezing, chest pain, palpitations, orthopnea, edema, abdominal pain, nausea, melena, diarrhea, constipation, flank pain, dysuria, hematuria, urinary  Frequency, nocturia, numbness, tingling,  seizures,  Focal weakness, Loss of consciousness,  Tremor, insomnia, depression, anxiety, and suicidal ideation.      Objective:  BP 130/78   Pulse 81   Temp 97.8 F (36.6 C)   Ht '5\' 4"'$  (1.626 m)   Wt 200 lb 12.8 oz (91.1 kg)   SpO2 98%   BMI 34.47 kg/m   BP Readings from Last 3 Encounters:  01/05/22 130/78  11/13/21 120/70  10/26/21 120/67    Wt Readings from Last 3 Encounters:  01/05/22 200 lb 12.8 oz (91.1 kg)  11/13/21 201 lb 12.8 oz (91.5 kg)  10/22/21 200 lb (90.7 kg)    General appearance: alert, cooperative and appears stated age Ears: normal TM's and external ear canals both ears Throat: lips, mucosa, and tongue normal; teeth and gums normal Neck: no adenopathy, no carotid bruit, supple, symmetrical, trachea midline and thyroid not enlarged, symmetric, no tenderness/mass/nodules Back: symmetric, no curvature. ROM normal. No CVA tenderness. Lungs: clear to auscultation bilaterally Heart: regular rate and rhythm, S1, S2 normal, no murmur, click, rub or gallop Abdomen: soft, non-tender; bowel sounds normal; no masses,  no organomegaly Pulses: 2+ and symmetric Skin: Skin color, texture, turgor normal. No rashes or lesions Lymph nodes: Cervical, supraclavicular, and axillary nodes normal. Neuro:  awake and interactive with normal mood and affect. Higher cortical functions are normal. Speech is clear without word-finding difficulty or dysarthria. Extraocular movements are intact. Visual fields of both eyes are grossly intact. Sensation to light touch is grossly intact bilaterally of upper and lower extremities. Motor examination shows 4+/5 symmetric hand grip and upper extremity and 5/5 lower extremity strength. There is no pronation or drift. Gait is non-ataxic   Lab Results  Component Value Date   HGBA1C 5.6 07/07/2021   HGBA1C 5.6 11/30/2020   HGBA1C 5.6 11/06/2019    Lab Results  Component Value Date   CREATININE 0.82 10/23/2021   CREATININE 0.63 08/04/2021    CREATININE 0.68 07/19/2021    Lab Results  Component Value Date   WBC 7.4 10/23/2021   HGB 13.2 10/23/2021   HCT 41.1 10/23/2021   PLT 151 10/23/2021   GLUCOSE 99 10/23/2021   CHOL 177 11/30/2020   TRIG 54.0 11/30/2020   HDL 55.30 11/30/2020   LDLCALC 111 (H) 11/30/2020   ALT 14 07/06/2021   AST 13 (L) 07/06/2021   NA 142 10/23/2021   K 4.0 10/23/2021   CL 106 10/23/2021   CREATININE 0.82 10/23/2021   BUN 26 (H) 10/23/2021   CO2 27 10/23/2021   INR 1.0 07/07/2021   HGBA1C 5.6 07/07/2021    CT GUIDED NEEDLE PLACEMENT  Result Date: 10/24/2021 CLINICAL DATA:  Right hip pain, right SI joint pain EXAM: RIGHT CT GUIDED SI JOINT INJECTION RADIATION DOSE REDUCTION:  This exam was performed according to the departmental dose-optimization program which includes automated exposure control, adjustment of the mA and/or kV according to patient size and/or use of iterative reconstruction technique. PROCEDURE: After a thorough discussion of risks and benefits of the procedure, including bleeding, infection, injury to nerves, blood vessels, and adjacent structures, verbal and written consent was obtained. Specific risks of the procedure included nondiagnostic/nontherapeutic injection and non target injection. The patient was placed prone on the CT table and localization was performed over the sacrum. Target site marked using CT guidance. The skin was prepped and draped in the usual sterile fashion using Betadine soap. After local anesthesia with 1% lidocaine without epinephrine and subsequent deep anesthesia, a 22 gauge spinal needle was advanced into the right SI joint under intermittent CT guidance. Once the needle was in satisfactory position, representative image was captured with the needle demonstrated in the sacroiliac joint. Subsequently, 80 mg methylprednisolone mixed with 1 mL 1% lidocaine was injected into the right SI joint. Needles removed and a sterile dressing applied. No complications were  observed. IMPRESSION: Successful CT-guided right SI joint steroid injection. Electronically Signed   By: Albin Felling M.D.   On: 10/24/2021 15:57   MR Lumbar Spine W Wo Contrast  Result Date: 10/23/2021 CLINICAL DATA:  71 year old female with increasing low back pain radiating to the right leg. Unable to walk. Planned spinal injection this week. EXAM: MRI LUMBAR SPINE WITHOUT AND WITH CONTRAST TECHNIQUE: Multiplanar and multiecho pulse sequences of the lumbar spine were obtained without and with intravenous contrast. CONTRAST:  60m GADAVIST GADOBUTROL 1 MMOL/ML IV SOLN COMPARISON:  Fluoroscopic spot images from spine injection 08/09/2021. CT Abdomen 03/04/2007. FINDINGS: Segmentation: Suggestion of transitional lumbosacral anatomy on the 2009 CT. Difficult to ascertain the lowest rib level on these images. Lumbar segmentation will be designated as normal. Correlation with radiographs is recommended prior to any operative intervention. Alignment: Mild dextroconvex upper and mild to moderate levoconvex lower lumbar scoliosis. There is subtle grade 1 anterolisthesis at both L4-L5 and L5-S1. Similar mild retrolisthesis L1-L2. Vertebrae: No marrow edema or evidence of acute osseous abnormality. Some chronic degenerative endplate marrow signal changes. Normal background bone marrow signal. Intact visible sacrum and SI joints. Conus medullaris and cauda equina: Conus extends to the T12-L1 level. No lower spinal cord or conus signal abnormality. In general the cauda equina nerve roots appear normal. There is no abnormal intradural enhancement. No dural thickening. Paraspinal and other soft tissues: Partially visible distended gallbladder. Negative visible other abdominal viscera. Negative visualized posterior paraspinal soft tissues. Disc levels: T11-T12: Negative. T12-L1: Mild disc desiccation and disc space loss. Minor disc bulging. No stenosis. L1-L2: Disc space loss with some evidence of vacuum disc.  Circumferential disc bulging eccentric to the left. Left far lateral component. Mild to moderate facet and ligament flavum hypertrophy. No significant spinal stenosis. Mild left lateral recess stenosis (left L2 nerve level). No convincing foraminal stenosis. L2-L3: Disc space loss with some vacuum disc. Circumferential disc bulging with moderate facet and ligament flavum hypertrophy. Mild to moderate spinal stenosis. Relatively symmetric lateral recess stenosis (L3 nerve levels). Asymmetric left foraminal disc. Mild to moderate left L2 foraminal stenosis. Mild if any right foraminal stenosis. L3-L4: Disc space loss. Circumferential but mostly far lateral disc bulging. Mild to moderate facet and ligament flavum hypertrophy. No spinal or lateral recess stenosis. Mild to moderate right L3 foraminal stenosis. L4-L5: Disc space loss. Vacuum disc. Mild anterolisthesis. Circumferential disc bulge. Moderate facet and moderate to severe ligament flavum  hypertrophy. Borderline spinal stenosis. Mild left lateral recess stenosis (left L5 nerve level). Mild to moderate left L4 foraminal stenosis. L5-S1: Mild anterolisthesis. Mild disc space loss and circumferential disc bulge eccentric to the left. Mild to moderate facet and ligament flavum hypertrophy. No spinal stenosis. No convincing lateral recess or right foraminal stenosis. Mild to moderate left foraminal stenosis in part related to left far lateral disc osteophyte complex. IMPRESSION: 1. Widespread lumbar spine degeneration with no acute osseous abnormality. Mild scoliosis and multilevel spondylolisthesis 2. Mild to moderate multifactorial spinal stenosis at L2-L3. Mild left lateral recess stenosis at L1-L2 and L4-L5. Up to moderate neural foraminal stenosis at the left L2, right L3, left L4 and left L5 nerve levels. 3. Partially visible distended gallbladder. Query right upper quadrant pain. Electronically Signed   By: Genevie Ann M.D.   On: 10/23/2021 06:39    Assessment  & Plan:   Problem List Items Addressed This Visit   None   I spent a total of   minutes with this patient in a face to face visit on the date of this encounter reviewing the last office visit with me in       ,  most recent visit with cardiology ,    ,  patient's diet and exercise habits, home blood pressure /blod sugar readings, recent ER visit including labs and imaging studies ,   and post visit ordering of testing and therapeutics.    Follow-up: No follow-ups on file.   Crecencio Mc, MD

## 2022-01-05 NOTE — Patient Instructions (Addendum)
Go to [facility] for Colace and miralax , much cheaper   If there is no UTI based on today's labs ,  I will send a covered medication for "overactive bladder " to your pharmacy

## 2022-01-06 LAB — LIPID PANEL W/REFLEX DIRECT LDL
Cholesterol: 177 mg/dL (ref <200)
HDL: 57 mg/dL (ref >=50)
LDL Cholesterol (Calc): 100 mg/dL (calc) — ABNORMAL HIGH
Non-HDL Cholesterol (Calc): 120 mg/dL (calc) (ref <130)
Total CHOL/HDL Ratio: 3.1 (calc) (ref <5.0)
Triglycerides: 103 mg/dL (ref <150)

## 2022-01-07 DIAGNOSIS — R300 Dysuria: Secondary | ICD-10-CM | POA: Insufficient documentation

## 2022-01-07 NOTE — Assessment & Plan Note (Signed)
Accompanied by urinary incontinence.  Awaiting results of UA w/micro and culture.

## 2022-01-08 LAB — URINE CULTURE
MICRO NUMBER:: 14258110
SPECIMEN QUALITY:: ADEQUATE

## 2022-01-09 MED ORDER — CIPROFLOXACIN HCL 250 MG PO TABS: 250.0000 mg | ORAL_TABLET | Freq: Two times a day (BID) | ORAL | 0 refills | Status: AC

## 2022-01-09 NOTE — Addendum Note (Signed)
Addended by: Crecencio Mc on: 01/09/2022 07:04 AM   Modules accepted: Orders

## 2022-01-16 ENCOUNTER — Encounter: Payer: Self-pay | Admitting: Internal Medicine

## 2022-01-25 ENCOUNTER — Telehealth: Payer: Self-pay

## 2022-01-25 NOTE — Telephone Encounter (Signed)
-----   Message from Crecencio Mc, MD sent at 01/17/2022  5:02 PM EST ----- Regarding: needs lab appt for repeat urine   this week and an OV to dicuss meds for incontinence later MyChart message sent  :   1) you should start the probiotic ASAP.  It will not affect your RA  2) since you are still having frequency  we should recheck your urine before starting any medication for overactive bladder.  I will have Ogle Hoeffner set up a lab appt (only) to recheck the urine and an office visit a few days later   Regards,   Deborra Medina, MD

## 2022-01-25 NOTE — Telephone Encounter (Signed)
Spoke with pt and she stated that she is visiting with friends and would like to call back to schedule the lab appt for next week to have her urine rechecked.  When pt calls back please schedule her for a lab appt next week.

## 2022-02-01 DIAGNOSIS — M0589 Other rheumatoid arthritis with rheumatoid factor of multiple sites: Secondary | ICD-10-CM | POA: Diagnosis not present

## 2022-02-01 LAB — BASIC METABOLIC PANEL
BUN: 20 (ref 4–21)
CO2: 28 — AB (ref 13–22)
Chloride: 105 (ref 99–108)
Creatinine: 0.8 (ref 0.5–1.1)
Glucose: 95
Potassium: 3.7 mEq/L (ref 3.5–5.1)
Sodium: 142 (ref 137–147)

## 2022-02-01 LAB — CBC AND DIFFERENTIAL
HCT: 40 (ref 36–46)
Hemoglobin: 13.1 (ref 12.0–16.0)
Platelets: 164 10*3/uL (ref 150–400)
WBC: 6

## 2022-02-01 LAB — COMPREHENSIVE METABOLIC PANEL
Albumin: 3.5 (ref 3.5–5.0)
Calcium: 8.7 (ref 8.7–10.7)
Globulin: 2.7

## 2022-02-01 LAB — HEPATIC FUNCTION PANEL
ALT: 10 U/L (ref 7–35)
AST: 25 (ref 13–35)
Alkaline Phosphatase: 81 (ref 25–125)
Bilirubin, Total: 0.5

## 2022-02-01 LAB — CBC: RBC: 4.2 (ref 3.87–5.11)

## 2022-02-02 ENCOUNTER — Encounter: Payer: Medicare PPO | Attending: Physical Medicine & Rehabilitation | Admitting: Physical Medicine & Rehabilitation

## 2022-02-02 ENCOUNTER — Encounter: Payer: Self-pay | Admitting: Physical Medicine & Rehabilitation

## 2022-02-02 VITALS — BP 122/81 | HR 95 | Ht 64.0 in | Wt 195.0 lb

## 2022-02-02 DIAGNOSIS — M25551 Pain in right hip: Secondary | ICD-10-CM

## 2022-02-02 NOTE — Progress Notes (Signed)
Subjective:    Patient ID: Wanda Lang, female    DOB: March 23, 1950, 71 y.o.   MRN: [id number]  HPI 71 year old female with history of chronic right-sided posterior and anterior hip pain.  She has undergone left total knee arthroplasty in June 2023.  Postoperatively patient states she developed a leg length discrepancy requiring a built-up shoe on the right side.  She is followed [facility] as well as physical medicine and rehab physician Dr. Nelva Bush. 1-2 week relief after RIght SI injection 09/28/21 , was hospitalized for exacerbation of back pain 10/23/2021.     71 year old female with increasing low back pain radiating to the right leg. Unable to walk.  EXAM: MRI LUMBAR SPINE WITHOUT AND WITH CONTRAST   TECHNIQUE: Multiplanar and multiecho pulse sequences of the lumbar spine were obtained without and with intravenous contrast.   CONTRAST:  62m GADAVIST GADOBUTROL 1 MMOL/ML IV SOLN   COMPARISON:  Fluoroscopic spot images from spine injection 08/09/2021. CT Abdomen 03/04/2007.   FINDINGS: Segmentation: Suggestion of transitional lumbosacral anatomy on the 2009 CT. Difficult to ascertain the lowest rib level on these images. Lumbar segmentation will be designated as normal. Correlation with radiographs is recommended prior to any operative intervention.   Alignment: Mild dextroconvex upper and mild to moderate levoconvex lower lumbar scoliosis. There is subtle grade 1 anterolisthesis at both L4-L5 and L5-S1. Similar mild retrolisthesis L1-L2.   Vertebrae: No marrow edema or evidence of acute osseous abnormality. Some chronic degenerative endplate marrow signal changes. Normal background bone marrow signal. Intact visible sacrum and SI joints.   Conus medullaris and cauda equina: Conus extends to the T12-L1 level. No lower spinal cord or conus signal abnormality. In general the cauda equina nerve roots appear normal. There is no abnormal intradural enhancement.  No dural thickening.   Paraspinal and other soft tissues: Partially visible distended gallbladder. Negative visible other abdominal viscera. Negative visualized posterior paraspinal soft tissues.   Disc levels:   T11-T12: Negative.   T12-L1: Mild disc desiccation and disc space loss. Minor disc bulging. No stenosis.   L1-L2: Disc space loss with some evidence of vacuum disc. Circumferential disc bulging eccentric to the left. Left far lateral component. Mild to moderate facet and ligament flavum hypertrophy. No significant spinal stenosis. Mild left lateral recess stenosis (left L2 nerve level). No convincing foraminal stenosis.   L2-L3: Disc space loss with some vacuum disc. Circumferential disc bulging with moderate facet and ligament flavum hypertrophy. Mild to moderate spinal stenosis. Relatively symmetric lateral recess stenosis (L3 nerve levels). Asymmetric left foraminal disc. Mild to moderate left L2 foraminal stenosis. Mild if any right foraminal stenosis.   L3-L4: Disc space loss. Circumferential but mostly far lateral disc bulging. Mild to moderate facet and ligament flavum hypertrophy. No spinal or lateral recess stenosis. Mild to moderate right L3 foraminal stenosis.   L4-L5: Disc space loss. Vacuum disc. Mild anterolisthesis. Circumferential disc bulge. Moderate facet and moderate to severe ligament flavum hypertrophy. Borderline spinal stenosis. Mild left lateral recess stenosis (left L5 nerve level). Mild to moderate left L4 foraminal stenosis.   L5-S1: Mild anterolisthesis. Mild disc space loss and circumferential disc bulge eccentric to the left. Mild to moderate facet and ligament flavum hypertrophy. No spinal stenosis. No convincing lateral recess or right foraminal stenosis. Mild to moderate left foraminal stenosis in part related to left far lateral disc osteophyte complex.   IMPRESSION: 1. Widespread lumbar spine degeneration with no acute  osseous abnormality. Mild scoliosis and multilevel spondylolisthesis  2. Mild to moderate multifactorial spinal stenosis at L2-L3. Mild left lateral recess stenosis at L1-L2 and L4-L5. Up to moderate neural foraminal stenosis at the left L2, right L3, left L4 and left L5 nerve levels.   3. Partially visible distended gallbladder. Query right upper quadrant pain.     Electronically Signed   By: Genevie Ann M.D.   On: 10/23/2021 06:39 Pain Inventory Average Pain 8 Pain Right Now 8 My pain is constant, sharp, and aching  In the last 24 hours, has pain interfered with the following? General activity 7 Relation with others 7 Enjoyment of life 7 What TIME of day is your pain at its worst? morning  and varies Sleep (in general) Fair  Pain is worse with: bending and standing Pain improves with: heat/ice, therapy/exercise, medication, TENS, and injections Relief from Meds: 63  Family History  Problem Relation Age of Onset   Alcohol abuse Mother    Arthritis Mother    Hyperlipidemia Mother    Heart disease Mother    Stroke Mother    Hypertension Mother    Depression Mother    Anxiety disorder Mother    Arthritis Father    Lung cancer Paternal Grandfather    Kidney disease Paternal Grandfather    Social History   Socioeconomic History   Marital status: Widowed    Spouse name: Not on file   Number of children: Not on file   Years of education: Not on file   Highest education level: Not on file  Occupational History   Not on file  Tobacco Use   Smoking status: Never   Smokeless tobacco: Never  Vaping Use   Vaping Use: Never used  Substance and Sexual Activity   Alcohol use: Yes    Alcohol/week: 1.0 standard drink of alcohol    Types: 1 Glasses of wine per week   Drug use: No   Sexual activity: Never  Other Topics Concern   Not on file  Social History Narrative   Not on file   Social Determinants of Health   Financial Resource Strain: Low Risk  (07/13/2021)    Overall Financial Resource Strain (CARDIA)    Difficulty of Paying Living Expenses: Not hard at all  Food Insecurity: No Food Insecurity (12/05/2021)   Hunger Vital Sign    Worried About Running Out of Food in the Last Year: Never true    Ran Out of Food in the Last Year: Never true  Transportation Needs: No Transportation Needs (12/05/2021)   PRAPARE - Hydrologist (Medical): No    Lack of Transportation (Non-Medical): No  Physical Activity: Not on file  Stress: No Stress Concern Present (07/13/2021)   [place]    Feeling of Stress : Not at all  Social Connections: Unknown (07/13/2021)   Social Connection and Isolation Panel [NHANES]    Frequency of Communication with Friends and Family: More than three times a week    Frequency of Social Gatherings with Friends and Family: More than three times a week    Attends Religious Services: More than 4 times per year    Active Member of [employer] or Organizations: Not on file    Attends Archivist Meetings: Not on file    Marital Status: Widowed   Past Surgical History:  Procedure Laterality Date   ABDOMINAL HYSTERECTOMY     COLONOSCOPY WITH PROPOFOL N/A 09/04/2018   Procedure: COLONOSCOPY WITH PROPOFOL;  Surgeon: Virgel Manifold, MD;  Location: [facility] ENDOSCOPY;  Service: Endoscopy;  Laterality: N/A;   ESOPHAGOGASTRODUODENOSCOPY (EGD) WITH PROPOFOL N/A 09/04/2018   Procedure: ESOPHAGOGASTRODUODENOSCOPY (EGD) WITH PROPOFOL;  Surgeon: Virgel Manifold, MD;  Location: [facility] ENDOSCOPY;  Service: Endoscopy;  Laterality: N/A;   LAPAROSCOPY     TONSILLECTOMY     TOTAL KNEE ARTHROPLASTY Left 07/18/2021   Procedure: TOTAL KNEE ARTHROPLASTY;  Surgeon: Paralee Cancel, MD;  Location: [facility];  Service: Orthopedics;  Laterality: Left;   TURBINATE REDUCTION     Past Surgical History:  Procedure Laterality Date   ABDOMINAL HYSTERECTOMY      COLONOSCOPY WITH PROPOFOL N/A 09/04/2018   Procedure: COLONOSCOPY WITH PROPOFOL;  Surgeon: Virgel Manifold, MD;  Location: [facility] ENDOSCOPY;  Service: Endoscopy;  Laterality: N/A;   ESOPHAGOGASTRODUODENOSCOPY (EGD) WITH PROPOFOL N/A 09/04/2018   Procedure: ESOPHAGOGASTRODUODENOSCOPY (EGD) WITH PROPOFOL;  Surgeon: Virgel Manifold, MD;  Location: [facility] ENDOSCOPY;  Service: Endoscopy;  Laterality: N/A;   LAPAROSCOPY     TONSILLECTOMY     TOTAL KNEE ARTHROPLASTY Left 07/18/2021   Procedure: TOTAL KNEE ARTHROPLASTY;  Surgeon: Paralee Cancel, MD;  Location: [facility];  Service: Orthopedics;  Laterality: Left;   TURBINATE REDUCTION     Past Medical History:  Diagnosis Date   Allergy    Arthritis    Chronic kidney disease    Coronary artery disease    GERD (gastroesophageal reflux disease)    Heart murmur    Hypertension    LIVER FUNCTION TESTS, ABNORMAL, HX OF 09/08/2007   Qualifier: Diagnosis of  By: Nelson-Smith CMA (AAMA), Dottie     Ht '5\' 4"'$  (1.626 m)   Wt 195 lb (88.5 kg)   BMI 33.47 kg/m   Opioid Risk Score:   Fall Risk Score:  `1  Depression screen PHQ 2/9     02/02/2022    1:41 PM 10/16/2021    4:01 PM 08/24/2021    9:21 AM 07/13/2021    8:55 AM 07/07/2021   11:54 AM 11/30/2020    9:47 AM 07/12/2020   10:52 AM  Depression screen PHQ 2/9  Decreased Interest 0 0 0 0 0 0 0  Down, Depressed, Hopeless 0 0 0 0 0 0 0  PHQ - 2 Score 0 0 0 0 0 0 0  Altered sleeping   0      Tired, decreased energy   1      Change in appetite   0      Feeling bad or failure about yourself    0      Trouble concentrating   0      Moving slowly or fidgety/restless   0      Suicidal thoughts   0      PHQ-9 Score   1      Difficult doing work/chores   Not difficult at all          Review of Systems  Musculoskeletal:  Positive for back pain.       Right hip pain/thigh pain  All other systems reviewed and are negative.     Objective:   Physical Exam Morbidly obese female no acute  distress Mood and affect are appropriate Extremities without edema Lumbar spine mild tenderness along the lumbar paraspinals on the right greater than left side.  Also moderate tenderness around the right PSIS area. No tenderness over the greater trochanters.  Mild pain to palpation at the right ischial tuberosity. Negative straight leg raise test bilaterally  Motor strength is 5/5 bilateral hip flexor knee extensor ankle dorsiflexor Sensation equal to light touch bilateral lower limbs Decreased range of motion right hip internal and external rotation.  Thigh thrust test is positive right greater than left side FABERs is limited due to range of motion at the hip.       Assessment & Plan:  #1.  Multifactorial right anterior and posterior hip pain.  She does have documented sacroiliac pain responding to 100% on short-term basis to sacroiliac injection and August 2023. In addition she has right hip contracture and extremely limited range of motion internal greater than external rotation.  Patient likely has hip OA, also history of RA as well. Would like to get x-ray of the right hip.  If significant arthritis next step would be orthopedic reevaluation.  Other treatment options include intra-articular injection of the hip which could be done either here or with Dr. Nelva Bush. Make referral for aquatic therapy given her decline in mobility. Will hold off on reinjection of right sacroiliac joint until above addressed.  There are options for SI joint would be SI nerve blocks and if nerve blocks produced greater than 50% relief on a temporary basis radiofrequency neurotomy of L5 dorsal ramus S1-S2-S3 lateral branches.

## 2022-02-07 ENCOUNTER — Ambulatory Visit: Payer: Medicare PPO | Admitting: Dermatology

## 2022-02-13 ENCOUNTER — Ambulatory Visit (HOSPITAL_BASED_OUTPATIENT_CLINIC_OR_DEPARTMENT_OTHER): Payer: Medicare PPO | Admitting: Physical Therapy

## 2022-02-15 ENCOUNTER — Other Ambulatory Visit: Payer: Self-pay

## 2022-02-15 ENCOUNTER — Ambulatory Visit (HOSPITAL_BASED_OUTPATIENT_CLINIC_OR_DEPARTMENT_OTHER): Payer: Medicare PPO | Attending: Physical Medicine & Rehabilitation | Admitting: Physical Therapy

## 2022-02-15 ENCOUNTER — Encounter (HOSPITAL_BASED_OUTPATIENT_CLINIC_OR_DEPARTMENT_OTHER): Payer: Self-pay | Admitting: Physical Therapy

## 2022-02-15 DIAGNOSIS — M25551 Pain in right hip: Secondary | ICD-10-CM | POA: Diagnosis not present

## 2022-02-15 DIAGNOSIS — R2689 Other abnormalities of gait and mobility: Secondary | ICD-10-CM

## 2022-02-15 DIAGNOSIS — M6281 Muscle weakness (generalized): Secondary | ICD-10-CM

## 2022-02-15 DIAGNOSIS — M5459 Other low back pain: Secondary | ICD-10-CM

## 2022-02-15 NOTE — Therapy (Addendum)
OUTPATIENT PHYSICAL THERAPY THORACOLUMBAR EVALUATION   Patient Name: Wanda Lang MRN: [id number] DOB:May 11, 1950, 72 y.o., female Today's Date: 02/15/2022  END OF SESSION:  PT End of Session - 02/15/22 1554     Visit Number 1    Number of Visits 20    Date for PT Re-Evaluation 04/26/22    Authorization Type humana    Progress Note Due on Visit 10    PT Start Time 1415    PT Stop Time 1500    PT Time Calculation (min) 45 min    Activity Tolerance Patient tolerated treatment well;Patient limited by pain    Behavior During Therapy WFL for tasks assessed/performed             Past Medical History:  Diagnosis Date   Allergy    Arthritis    Chronic kidney disease    Coronary artery disease    GERD (gastroesophageal reflux disease)    Heart murmur    Hypertension    LIVER FUNCTION TESTS, ABNORMAL, HX OF 09/08/2007   Qualifier: Diagnosis of  By: Harlon Ditty CMA (AAMA), Dottie     Past Surgical History:  Procedure Laterality Date   ABDOMINAL HYSTERECTOMY     COLONOSCOPY WITH PROPOFOL N/A 09/04/2018   Procedure: COLONOSCOPY WITH PROPOFOL;  Surgeon: Virgel Manifold, MD;  Location: [facility];  Service: Endoscopy;  Laterality: N/A;   ESOPHAGOGASTRODUODENOSCOPY (EGD) WITH PROPOFOL N/A 09/04/2018   Procedure: ESOPHAGOGASTRODUODENOSCOPY (EGD) WITH PROPOFOL;  Surgeon: Virgel Manifold, MD;  Location: [facility];  Service: Endoscopy;  Laterality: N/A;   LAPAROSCOPY     TONSILLECTOMY     TOTAL KNEE ARTHROPLASTY Left 07/18/2021   Procedure: TOTAL KNEE ARTHROPLASTY;  Surgeon: Paralee Cancel, MD;  Location: [facility];  Service: Orthopedics;  Laterality: Left;   TURBINATE REDUCTION     Patient Active Problem List   Diagnosis Date Noted   Dysuria 01/07/2022   Postmenopausal estrogen deficiency 11/13/2021   Leg length inequality 11/06/2021   Impaired ambulation 10/23/2021   Contact dermatitis 10/16/2021   SI joint arthritis 08/24/2021   Spinal stenosis, lumbar  region, without neurogenic claudication 08/24/2021   Degeneration of lumbar intervertebral disc 07/24/2021   Stiffness of left knee 07/21/2021   S/P total knee arthroplasty, left 07/18/2021   Preop examination 07/07/2021   Right hip pain 07/07/2021   Low back pain 07/07/2021   Drug-induced immunodeficiency ([place]) 06/12/2021   Impingement syndrome of right shoulder region 06/12/2021   Preop cardiovascular exam 06/08/2021   COVID-19 01/24/2021   Hammer toe 04/22/2020   Metatarsalgia of left foot 04/22/2020   Osteoarthritis of midfoot 04/22/2020   Duodenal ulcer 11/12/2019   Stress 11/12/2019   Morbid obesity ([place]) 11/06/2019   Pain in left knee 07/30/2019   Nonrheumatic aortic valve stenosis 04/14/2018   Heart murmur 02/06/2018   Maxillary sinusitis, chronic 10/24/2017   Chronic diarrhea 10/09/2017   Acid reflux 07/15/2017   Asymptomatic carotid artery stenosis 07/15/2017   Cyst of skin 07/15/2017   Immunocompromised ([place]) 11/09/2016   Allergic rhinitis 11/02/2016   Tremor 11/02/2016   History of hypertension 11/02/2016   Postmenopausal symptoms 07/01/2015   Seasonal allergic rhinitis due to pollen 07/01/2015   Fatty liver 09/08/2007   Rheumatoid arthritis ([place]) 09/08/2007    PCP: Leone Haven, MD   REFERRING PROVIDER: Charlett Blake, MD     REFERRING DIAG: [phone] (ICD-10-CM) - Right hip pain   Rationale for Evaluation and Treatment: Rehabilitation  THERAPY DIAG:  Pain in right hip  Other low back pain  Muscle weakness (generalized)  Other abnormalities of gait and mobility  ONSET DATE: ~ 6 months  SUBJECTIVE:                                                                                                                                                                                           SUBJECTIVE STATEMENT: L TKR in June 2023.  Healed well.  Was using walking stick but then all of a sudden I have a leg length difference.  MD sent me to pain  management where they started putting in cortizone shots for back pain. Had 3-4 injections in SI with the last one helping. I have had a lift built onto my shoe for past few months.  My SI feels better since.   PAIN:  Are you having pain? Yes: NPRS scale: currently 4/10  worst 8/10 Pain location: Right SI through posterior and anterior hip Pain description: sharp quality to ache Aggravating factors: weight bearing Relieving factors: Oral meds,lidocaine patches, TENS sitting, lying down  PRECAUTIONS: Fall  WEIGHT BEARING RESTRICTIONS: No  FALLS:  Has patient fallen in last 6 months? No  LIVING ENVIRONMENT: Lives with: lives alone Lives in: House/apartment Stairs:  2 steps into sunk in living room to kitchen and bathroom.  Has a hand rail Has following equipment at home: Gilford Rile - 2 wheeled  OCCUPATION: retired Pharmacist, hospital  PLOF: Independent with household mobility with device  PATIENT GOALS: walk better, decrease pain, be able to transfer (car, into bed) better, get around home safer  NEXT MD VISIT: Feb Dr Letta Pate  OBJECTIVE:   DIAGNOSTIC FINDINGS:  MRI lumbar spine 10/23/21 IMPRESSION: 1. Widespread lumbar spine degeneration with no acute osseous abnormality. Mild scoliosis and multilevel spondylolisthesis   2. Mild to moderate multifactorial spinal stenosis at L2-L3. Mild left lateral recess stenosis at L1-L2 and L4-L5. Up to moderate neural foraminal stenosis at the left L2, right L3, left L4 and left L5 nerve levels.   3. Partially visible distended gallbladder. Query right upper quadrant pain.  PATIENT SURVEYS:  FOTO 26% with goal of 51 at visit 15   COGNITION: Overall cognitive status: Within functional limits for tasks assessed     SENSATION: WFL  MUSCLE LENGTH: Hamstrings: wfl   POSTURE: rounded shoulders, weight shift left, and right hip slightly higher than left  PALPATION: Right hip At gr trochanter TTP  LUMBAR ROM:   Limited with side bending  Increased hip pain  LOWER EXTREMITY ROM:     Active  Right eval Left eval  Hip flexion 40d active 90 passive 110 active  Hip extension  Hip abduction    Hip adduction    Hip internal rotation 20   Hip external rotation 30   Knee flexion 110 120  Knee extension 0 0 stiff  Ankle dorsiflexion wfl wfl  Ankle plantarflexion    Ankle inversion    Ankle eversion     (Blank rows = not tested)  LOWER EXTREMITY MMT:    MMT Right eval Left eval  Hip flexion 3- 3+  Hip extension    Hip abduction 3- 5  Hip adduction 4 5  Hip internal rotation    Hip external rotation    Knee flexion 5 4+  Knee extension 5 4+  Ankle dorsiflexion    Ankle plantarflexion    Ankle inversion    Ankle eversion     (Blank rows = not tested)  LUMBAR SPECIAL TESTS:  Marcello Moores test: Negative  FUNCTIONAL TESTS:  TBA at later date as tolerated  GAIT: Distance walked: 443f Assistive device utilized: WEnvironmental consultant- 2 wheeled Level of assistance: Modified independence Comments: decreased stance time lle, right leaning, lack of right hip flex just clearing right foot from floor. Cadence slowed and antalgic  TODAY'S TREATMENT:                                                                                                                              Evaluation Foto Objective testing Exercises:see HEP   PATIENT EDUCATION:  Education details: Discussed eval findings, rehab rationale and POC and patient is in agreement Person educated: Patient Education method: Explanation Education comprehension: verbalized understanding and returned demonstration  HOME EXERCISE PROGRAM: Access Code: [email or web address]: [email or web address] Date: 02/15/2022 Prepared by: FDenton Meek Exercises - Supine Posterior Pelvic Tilt  - 1 x daily - 7 x weekly - 3 sets - 10 reps - Supine Single Leg Ankle Pumps  - 1 x daily - 7 x weekly - 3 sets - 10 reps - Supine Heel Slide  - 1 x daily - 7 x weekly - 3 sets - 10  reps - Supine Hip Abduction  - 1 x daily - 7 x weekly - 3 sets - 10 reps - Active Straight Leg Raise with Quad Set  - 1 x daily - 7 x weekly - 3 sets - 10 reps  ASSESSMENT:  CLINICAL IMPRESSION: Patient is a 72y.o. F who was seen today for physical therapy evaluation and treatment for R hip pain.   She reports she has lost 65 lb since Last January needed to have left TKR.  She presents today using FWW and has a lifted right shoe.  Pt reports SI and LBP as well as right hip pain.  Leg length difference since TKR but I am suspicious she has had an advanced right hip OA which may have caused the shortening.  She does report a slight lumbar scoliosis but does not appear to be significant. Right hip strength deficits along with IR and ER limitations in range.  Right SI pain improved when lift was added to shoe. She has an order for an xray right hip. She will benefit from skilled PT physical therapy beginning with aquatic intervention progressing to land based when appropriate.  She will progress with improved tolerance using the properties of water to improve strength, ROM, decrease pain, improve balance/gait translating to improvement in ADL's and functional mobility.  OBJECTIVE IMPAIRMENTS: Abnormal gait, decreased activity tolerance, decreased balance, decreased endurance, decreased mobility, difficulty walking, decreased ROM, decreased strength, decreased safety awareness, increased muscle spasms, impaired flexibility, improper body mechanics, postural dysfunction, obesity, and pain.   ACTIVITY LIMITATIONS: carrying, lifting, bending, sitting, standing, squatting, sleeping, stairs, transfers, bed mobility, and locomotion level  PARTICIPATION LIMITATIONS: meal prep, cleaning, laundry, driving, shopping, community activity, occupation, and yard work  PERSONAL FACTORS: Age, Fitness, Past/current experiences, Time since onset of injury/illness/exacerbation, and 1-2 comorbidities: obesity, leg length  difference  are also affecting patient's functional outcome.   REHAB POTENTIAL: Good  CLINICAL DECISION MAKING: Evolving/moderate complexity  EVALUATION COMPLEXITY: Moderate   GOALS: Goals reviewed with patient? Yes  SHORT TERM GOALS: Target date: 03/22/22  Pt will tolerate aquatic therapy sessions without increase in pain to demonstrate positive response to intervention Baseline:TBA Goal status: INITIAL  2.  Pt will tolerate driving without increase in pain Baseline: barely tolerable Goal status: INITIAL  3.  Pt will be able to move right foot on and off of car pedals with ease. Baseline: much difficulty and pain Goal status: INITIAL  4.  Pt will improve rle hip flex and abd strength to 3+/5 or > to demonstrate the safe ability to leverage RLE with all transfers with improved safety. Baseline: 3- Goal status: INITIAL  5.  Pt to be indep and safe with putting fww into car then getting into drivers side Baseline: "worse thing I do very unsteady" Goal status: INITIAL  6.  Pt will use stairs into and out of pool to improve strength with completion improving safety with stairs at home. Baseline:  Goal status: INITIAL  LONG TERM GOALS: Target date: 04/26/22  Foto goal of 51 will be met to demonstrate pt improved perception of her functional mobility Baseline: 26% Goal status: INITIAL  2.  Pt to decrease max pain to <or=4/10 to demonstrate improvement in management of condition. Baseline: 8/10 Goal status: INITIAL  3.  Pt will be able to lift her rle into bed without UE assistance demonstrating an improvement in strength Baseline: UE lifts rle into bed Goal status: INITIAL  4.  Pt will be indep with final HEP (land and/or aquatic as indicated) for long term management of condition Baseline: basic Sup exercises Goal status: INITIAL  5.  Pt will improve strength in all weak areas up at least 1 full grade to improve functional mobility and ADL's Baseline: see  chart   PLAN:  PT FREQUENCY: 1-2x/week  PT DURATION: 10 weeks  PLANNED INTERVENTIONS: Therapeutic exercises, Therapeutic activity, Neuromuscular re-education, Balance training, Gait training, Patient/Family education, Self Care, Joint mobilization, Joint manipulation, Stair training, Orthotic/Fit training, DME instructions, Aquatic Therapy, Dry Needling, Electrical stimulation, Cryotherapy, Moist heat, scar mobilization, Splintting, Taping, Ultrasound, Parrafin, Biofeedback, Ionotophoresis '4mg'$ /ml Dexamethasone, Manual therapy, and Re-evaluation.  PLAN FOR NEXT SESSION: Aquatics for strengthening LE/core; gait and balance retraining; stair climbing, aerobic capacity improvement; Pt edu/training, sup <>sit   [place] Meek, PTMPT 02/15/2022, 3:57 PM   Referring diagnosis? R hip pain Treatment diagnosis? (if different than referring diagnosis) right hip pain What was this (referring dx) caused by? '[]'$   Surgery '[]'$  Fall '[x]'$  Ongoing issue '[x]'$  Arthritis '[]'$  Other: ____________  Laterality: '[]'$  Rt '[]'$  Lt '[x]'$  Both  Check all possible CPT codes:  *CHOOSE 10 OR LESS*    '[x]'$  97110 (Therapeutic Exercise)  '[]'$  92507 (SLP Treatment)  '[]'$  97112 (Neuro Re-ed)   '[]'$  92526 (Swallowing Treatment)   '[x]'$  97116 (Gait Training)   '[]'$  [id number] (Cognitive Training, 1st 15 minutes) '[x]'$  15726 (Manual Therapy)   '[]'$  97130 (Cognitive Training, each add'l 15 minutes)  '[x]'$  97164 (Re-evaluation)                              '[]'$  Other, List CPT Code ____________  '[x]'$  20355 (Therapeutic Activities)     '[x]'$  97535 (Self Care)   '[]'$  All codes above (97110 - 97535)  '[]'$  97012 (Mechanical Traction)  '[x]'$  97014 (E-stim Unattended)  '[x]'$  97032 (E-stim manual)  '[x]'$  97033 (Ionto)  '[x]'$  97035 (Ultrasound) '[x]'$  97750 (Physical Performance Training) '[x]'$  [id number] (Aquatic Therapy) '[]'$  97016 (Vasopneumatic Device) '[]'$  [id number] (Paraffin) '[]'$  97034 (Contrast Bath) '[]'$  97597 (Wound Care 1st 20 sq cm) '[]'$  97598 (Wound Care each add'l 20 sq cm) '[x]'$   97760 (Orthotic Fabrication, Fitting, Training Initial) '[]'$  [id number] (Prosthetic Management and Training Initial) '[]'$  [id number] (Orthotic or Prosthetic Training/ Modification Subsequent)   Addend Stanton Kidney Tharon Aquas) [place] MPT 02/15/22 519p

## 2022-02-19 DIAGNOSIS — Z13820 Encounter for screening for osteoporosis: Secondary | ICD-10-CM | POA: Diagnosis not present

## 2022-02-19 DIAGNOSIS — M8588 Other specified disorders of bone density and structure, other site: Secondary | ICD-10-CM | POA: Diagnosis not present

## 2022-02-19 DIAGNOSIS — Z6833 Body mass index (BMI) 33.0-33.9, adult: Secondary | ICD-10-CM | POA: Diagnosis not present

## 2022-02-19 DIAGNOSIS — N958 Other specified menopausal and perimenopausal disorders: Secondary | ICD-10-CM | POA: Diagnosis not present

## 2022-02-19 DIAGNOSIS — Z01419 Encounter for gynecological examination (general) (routine) without abnormal findings: Secondary | ICD-10-CM | POA: Diagnosis not present

## 2022-02-19 DIAGNOSIS — M25551 Pain in right hip: Secondary | ICD-10-CM | POA: Diagnosis not present

## 2022-02-19 DIAGNOSIS — Z1231 Encounter for screening mammogram for malignant neoplasm of breast: Secondary | ICD-10-CM | POA: Diagnosis not present

## 2022-02-19 DIAGNOSIS — R29890 Loss of height: Secondary | ICD-10-CM | POA: Diagnosis not present

## 2022-02-22 ENCOUNTER — Ambulatory Visit (HOSPITAL_BASED_OUTPATIENT_CLINIC_OR_DEPARTMENT_OTHER)
Admission: RE | Admit: 2022-02-22 | Discharge: 2022-02-22 | Disposition: A | Discharge status: Home / Self Care | Payer: Medicare PPO | Source: Ambulatory Visit | Attending: Physical Medicine & Rehabilitation | Admitting: Physical Medicine & Rehabilitation

## 2022-02-22 DIAGNOSIS — M25551 Pain in right hip: Secondary | ICD-10-CM | POA: Diagnosis not present

## 2022-02-28 ENCOUNTER — Ambulatory Visit (HOSPITAL_BASED_OUTPATIENT_CLINIC_OR_DEPARTMENT_OTHER): Payer: Medicare PPO | Admitting: Physical Therapy

## 2022-02-28 ENCOUNTER — Encounter (HOSPITAL_BASED_OUTPATIENT_CLINIC_OR_DEPARTMENT_OTHER): Payer: Self-pay | Admitting: Physical Therapy

## 2022-02-28 DIAGNOSIS — M5459 Other low back pain: Secondary | ICD-10-CM

## 2022-02-28 DIAGNOSIS — R2689 Other abnormalities of gait and mobility: Secondary | ICD-10-CM

## 2022-02-28 DIAGNOSIS — M25551 Pain in right hip: Secondary | ICD-10-CM | POA: Diagnosis not present

## 2022-02-28 DIAGNOSIS — M6281 Muscle weakness (generalized): Secondary | ICD-10-CM

## 2022-02-28 NOTE — Therapy (Signed)
OUTPATIENT PHYSICAL THERAPY THORACOLUMBAR EVALUATION   Patient Name: Wanda Lang MRN: [id number] DOB:April 29, 1950, 72 y.o., female Today's Date: 02/28/2022  END OF SESSION:  PT End of Session - 02/28/22 1608     Visit Number 2    Number of Visits 20    Date for PT Re-Evaluation 04/26/22    Authorization Type humana    Progress Note Due on Visit 10    PT Start Time 1550    PT Stop Time 1630    PT Time Calculation (min) 40 min    Activity Tolerance Patient tolerated treatment well;Patient limited by pain    Behavior During Therapy WFL for tasks assessed/performed             Past Medical History:  Diagnosis Date   Allergy    Arthritis    Chronic kidney disease    Coronary artery disease    GERD (gastroesophageal reflux disease)    Heart murmur    Hypertension    LIVER FUNCTION TESTS, ABNORMAL, HX OF 09/08/2007   Qualifier: Diagnosis of  By: Harlon Ditty CMA (AAMA), Dottie     Past Surgical History:  Procedure Laterality Date   ABDOMINAL HYSTERECTOMY     COLONOSCOPY WITH PROPOFOL N/A 09/04/2018   Procedure: COLONOSCOPY WITH PROPOFOL;  Surgeon: Virgel Manifold, MD;  Location: [facility];  Service: Endoscopy;  Laterality: N/A;   ESOPHAGOGASTRODUODENOSCOPY (EGD) WITH PROPOFOL N/A 09/04/2018   Procedure: ESOPHAGOGASTRODUODENOSCOPY (EGD) WITH PROPOFOL;  Surgeon: Virgel Manifold, MD;  Location: [facility];  Service: Endoscopy;  Laterality: N/A;   LAPAROSCOPY     TONSILLECTOMY     TOTAL KNEE ARTHROPLASTY Left 07/18/2021   Procedure: TOTAL KNEE ARTHROPLASTY;  Surgeon: Paralee Cancel, MD;  Location: [facility];  Service: Orthopedics;  Laterality: Left;   TURBINATE REDUCTION     Patient Active Problem List   Diagnosis Date Noted   Dysuria 01/07/2022   Postmenopausal estrogen deficiency 11/13/2021   Leg length inequality 11/06/2021   Impaired ambulation 10/23/2021   Contact dermatitis 10/16/2021   SI joint arthritis 08/24/2021   Spinal stenosis, lumbar  region, without neurogenic claudication 08/24/2021   Degeneration of lumbar intervertebral disc 07/24/2021   Stiffness of left knee 07/21/2021   S/P total knee arthroplasty, left 07/18/2021   Preop examination 07/07/2021   Right hip pain 07/07/2021   Low back pain 07/07/2021   Drug-induced immunodeficiency ([place]) 06/12/2021   Impingement syndrome of right shoulder region 06/12/2021   Preop cardiovascular exam 06/08/2021   COVID-19 01/24/2021   Hammer toe 04/22/2020   Metatarsalgia of left foot 04/22/2020   Osteoarthritis of midfoot 04/22/2020   Duodenal ulcer 11/12/2019   Stress 11/12/2019   Morbid obesity ([place]) 11/06/2019   Pain in left knee 07/30/2019   Nonrheumatic aortic valve stenosis 04/14/2018   Heart murmur 02/06/2018   Maxillary sinusitis, chronic 10/24/2017   Chronic diarrhea 10/09/2017   Acid reflux 07/15/2017   Asymptomatic carotid artery stenosis 07/15/2017   Cyst of skin 07/15/2017   Immunocompromised ([place]) 11/09/2016   Allergic rhinitis 11/02/2016   Tremor 11/02/2016   History of hypertension 11/02/2016   Postmenopausal symptoms 07/01/2015   Seasonal allergic rhinitis due to pollen 07/01/2015   Fatty liver 09/08/2007   Rheumatoid arthritis ([place]) 09/08/2007    PCP: Leone Haven, MD   REFERRING PROVIDER: Charlett Blake, MD     REFERRING DIAG: [phone] (ICD-10-CM) - Right hip pain   Rationale for Evaluation and Treatment: Rehabilitation  THERAPY DIAG:  Pain in right hip  Other low back pain  Muscle weakness (generalized)  Other abnormalities of gait and mobility  ONSET DATE: ~ 6 months  SUBJECTIVE:                                                                                                                                                                                           SUBJECTIVE STATEMENT:  Feel ok. It always takes me longer to get ready than I think   L TKR in June 2023.  Healed well.  Was using walking stick but then all  of a sudden I have a leg length difference.  MD sent me to pain management where they started putting in cortizone shots for back pain. Had 3-4 injections in SI with the last one helping. I have had a lift built onto my shoe for past few months.  My SI feels better since.   PAIN:  Are you having pain? Yes: NPRS scale: currently 6/10  worst 8/10 Pain location: Right SI through posterior and anterior hip Pain description: sharp quality to ache Aggravating factors: weight bearing Relieving factors: Oral meds,lidocaine patches, TENS sitting, lying down  PRECAUTIONS: Fall  WEIGHT BEARING RESTRICTIONS: No  FALLS:  Has patient fallen in last 6 months? No  LIVING ENVIRONMENT: Lives with: lives alone Lives in: House/apartment Stairs:  2 steps into sunk in living room to kitchen and bathroom.  Has a hand rail Has following equipment at home: Gilford Rile - 2 wheeled  OCCUPATION: retired Pharmacist, hospital  PLOF: Independent with household mobility with device  PATIENT GOALS: walk better, decrease pain, be able to transfer (car, into bed) better, get around home safer  NEXT MD VISIT: Feb Dr Letta Pate  OBJECTIVE:   DIAGNOSTIC FINDINGS:   IMPRESSION: x-ray 02/24/22 1. Osteolysis of the right femoral head which has significantly progressed compared with 06/16/2019 and severe right hip joint space narrowing as can be seen with rapidly progressive osteoarthritis. Septic arthritis is considered much less likely.   MRI lumbar spine 10/23/21 IMPRESSION: 1. Widespread lumbar spine degeneration with no acute osseous abnormality. Mild scoliosis and multilevel spondylolisthesis   2. Mild to moderate multifactorial spinal stenosis at L2-L3. Mild left lateral recess stenosis at L1-L2 and L4-L5. Up to moderate neural foraminal stenosis at the left L2, right L3, left L4 and left L5 nerve levels.   3. Partially visible distended gallbladder. Query right upper quadrant pain.  PATIENT SURVEYS:  FOTO 26% with  goal of 51 at visit 15   COGNITION: Overall cognitive status: Within functional limits for tasks assessed     SENSATION: WFL  MUSCLE LENGTH: Hamstrings: wfl   POSTURE: rounded shoulders, weight  shift left, and right hip slightly higher than left  PALPATION: Right hip At gr trochanter TTP  LUMBAR ROM:   Limited with side bending Increased hip pain  LOWER EXTREMITY ROM:     Active  Right eval Left eval  Hip flexion 40d active 90 passive 110 active  Hip extension    Hip abduction    Hip adduction    Hip internal rotation 20   Hip external rotation 30   Knee flexion 110 120  Knee extension 0 0 stiff  Ankle dorsiflexion wfl wfl  Ankle plantarflexion    Ankle inversion    Ankle eversion     (Blank rows = not tested)  LOWER EXTREMITY MMT:    MMT Right eval Left eval  Hip flexion 3- 3+  Hip extension    Hip abduction 3- 5  Hip adduction 4 5  Hip internal rotation    Hip external rotation    Knee flexion 5 4+  Knee extension 5 4+  Ankle dorsiflexion    Ankle plantarflexion    Ankle inversion    Ankle eversion     (Blank rows = not tested)  LUMBAR SPECIAL TESTS:  Marcello Moores test: Negative  FUNCTIONAL TESTS:  TBA at later date as tolerated  GAIT: Distance walked: 470f Assistive device utilized: WEnvironmental consultant- 2 wheeled Level of assistance: Modified independence Comments: decreased stance time lle, right leaning, lack of right hip flex just clearing right foot from floor. Cadence slowed and antalgic  TODAY'S TREATMENT:                                                                                                                              Pt seen for aquatic therapy today.  Treatment took place in water 3.25-4.5 ft in depth at the [facility] Temp of water was 91.  Pt entered/exited the pool via lift.   *Intro to setting  *standing ue on wall: marching *Walking forward 3.6 ft ue support blue barbell multiple widths. Moved to 4.0 ft added 2  widths backward and 2 side stepping *Standing ue on wall one on barbell right hip circles small diameter. DF; PF; right hip abd *Seated on lift: cycling; add/abd and SLR x15  Pt requires the buoyancy and hydrostatic pressure of water for support, and to offload joints by unweighting joint load by at least 50 % in navel deep water and by at least 75-80% in chest to neck deep water.  Viscosity of the water is needed for resistance of strengthening. Water current perturbations provides challenge to standing balance requiring increased core activation.    PATIENT EDUCATION:  Education details: Discussed eval findings, rehab rationale and POC and patient is in agreement Person educated: Patient Education method: Explanation Education comprehension: verbalized understanding and returned demonstration  HOME EXERCISE PROGRAM: Access Code: [email or web address]: [email or web address] Date: 02/15/2022 Prepared by: FDenton Meek Exercises - Supine Posterior Pelvic Tilt  - 1 x daily - 7  x weekly - 3 sets - 10 reps - Supine Single Leg Ankle Pumps  - 1 x daily - 7 x weekly - 3 sets - 10 reps - Supine Heel Slide  - 1 x daily - 7 x weekly - 3 sets - 10 reps - Supine Hip Abduction  - 1 x daily - 7 x weekly - 3 sets - 10 reps - Active Straight Leg Raise with Quad Set  - 1 x daily - 7 x weekly - 3 sets - 10 reps  ASSESSMENT:  CLINICAL IMPRESSION: New xray report states advanced OA right hip (see above).  Pt demonstrates safety submerged with distant supervision from deck.  She is not afraid of water but does require extra time to acclimate. Initiated treatment today with ambulation and gentle stretching and ROM.  She does report pain in right hip with add/abd and circles which decreases with working in smaller ranges and after a few repetitions of tasks. Ambulating with multiple small perturbations which she recovers from indep and without fear.  Upon completion of session she report hip feels  looser, pain remained the same.  She is a good candidate for aquatic therapy and will benefit from the properties of water to improve strength and balance decreasing fall risk and improving pt functional mobility   Initial assessment Patient is a 72 y.o. F who was seen today for physical therapy evaluation and treatment for R hip pain.   She reports she has lost 65 lb since Last January needed to have left TKR.  She presents today using FWW and has a lifted right shoe.  Pt reports SI and LBP as well as right hip pain.  Leg length difference since TKR but I am suspicious she has had an advanced right hip OA which may have caused the shortening.  She does report a slight lumbar scoliosis but does not appear to be significant. Right hip strength deficits along with IR and ER limitations in range.  Right SI pain improved when lift was added to shoe. She has an order for an xray right hip. She will benefit from skilled PT physical therapy beginning with aquatic intervention progressing to land based when appropriate.  She will progress with improved tolerance using the properties of water to improve strength, ROM, decrease pain, improve balance/gait translating to improvement in ADL's and functional mobility.  OBJECTIVE IMPAIRMENTS: Abnormal gait, decreased activity tolerance, decreased balance, decreased endurance, decreased mobility, difficulty walking, decreased ROM, decreased strength, decreased safety awareness, increased muscle spasms, impaired flexibility, improper body mechanics, postural dysfunction, obesity, and pain.   ACTIVITY LIMITATIONS: carrying, lifting, bending, sitting, standing, squatting, sleeping, stairs, transfers, bed mobility, and locomotion level  PARTICIPATION LIMITATIONS: meal prep, cleaning, laundry, driving, shopping, community activity, occupation, and yard work  PERSONAL FACTORS: Age, Fitness, Past/current experiences, Time since onset of injury/illness/exacerbation, and 1-2  comorbidities: obesity, leg length difference  are also affecting patient's functional outcome.   REHAB POTENTIAL: Good  CLINICAL DECISION MAKING: Evolving/moderate complexity  EVALUATION COMPLEXITY: Moderate   GOALS: Goals reviewed with patient? Yes  SHORT TERM GOALS: Target date: 03/22/22  Pt will tolerate aquatic therapy sessions without increase in pain to demonstrate positive response to intervention Baseline:TBA Goal status: INITIAL  2.  Pt will tolerate driving without increase in pain Baseline: barely tolerable Goal status: INITIAL  3.  Pt will be able to move right foot on and off of car pedals with ease. Baseline: much difficulty and pain Goal status: INITIAL  4.  Pt  will improve rle hip flex and abd strength to 3+/5 or > to demonstrate the safe ability to leverage RLE with all transfers with improved safety. Baseline: 3- Goal status: INITIAL  5.  Pt to be indep and safe with putting fww into car then getting into drivers side Baseline: "worse thing I do very unsteady" Goal status: INITIAL  6.  Pt will use stairs into and out of pool to improve strength with completion improving safety with stairs at home. Baseline:  Goal status: INITIAL  LONG TERM GOALS: Target date: 04/26/22  Foto goal of 51 will be met to demonstrate pt improved perception of her functional mobility Baseline: 26% Goal status: INITIAL  2.  Pt to decrease max pain to <or=4/10 to demonstrate improvement in management of condition. Baseline: 8/10 Goal status: INITIAL  3.  Pt will be able to lift her rle into bed without UE assistance demonstrating an improvement in strength Baseline: UE lifts rle into bed Goal status: INITIAL  4.  Pt will be indep with final HEP (land and/or aquatic as indicated) for long term management of condition Baseline: basic Sup exercises Goal status: INITIAL  5.  Pt will improve strength in all weak areas up at least 1 full grade to improve functional mobility  and ADL's Baseline: see chart   PLAN:  PT FREQUENCY: 1-2x/week  PT DURATION: 10 weeks  PLANNED INTERVENTIONS: Therapeutic exercises, Therapeutic activity, Neuromuscular re-education, Balance training, Gait training, Patient/Family education, Self Care, Joint mobilization, Joint manipulation, Stair training, Orthotic/Fit training, DME instructions, Aquatic Therapy, Dry Needling, Electrical stimulation, Cryotherapy, Moist heat, scar mobilization, Splintting, Taping, Ultrasound, Parrafin, Biofeedback, Ionotophoresis '4mg'$ /ml Dexamethasone, Manual therapy, and Re-evaluation.  PLAN FOR NEXT SESSION: Aquatics for strengthening LE/core; gait and balance retraining; stair climbing, aerobic capacity improvement; Pt edu/training, sup <>sit   Denton Meek, PTMPT 02/28/2022, 4:13 PM   Referring diagnosis? R hip pain Treatment diagnosis? (if different than referring diagnosis) right hip pain What was this (referring dx) caused by? '[]'$  Surgery '[]'$  Fall '[x]'$  Ongoing issue '[x]'$  Arthritis '[]'$  Other: ____________  Laterality: '[]'$  Rt '[]'$  Lt '[x]'$  Both  Check all possible CPT codes:  *CHOOSE 10 OR LESS*    '[x]'$  97110 (Therapeutic Exercise)  '[]'$  92507 (SLP Treatment)  '[]'$  97112 (Neuro Re-ed)   '[]'$  92526 (Swallowing Treatment)   '[x]'$  97116 (Gait Training)   '[]'$  [id number] (Cognitive Training, 1st 15 minutes) '[x]'$  97140 (Manual Therapy)   '[]'$  97130 (Cognitive Training, each add'l 15 minutes)  '[x]'$  97164 (Re-evaluation)                              '[]'$  Other, List CPT Code ____________  '[x]'$  97530 (Therapeutic Activities)     '[x]'$  97535 (Self Care)   '[]'$  All codes above (97110 - 97535)  '[]'$  97012 (Mechanical Traction)  '[x]'$  97014 (E-stim Unattended)  '[x]'$  97032 (E-stim manual)  '[x]'$  97033 (Ionto)  '[x]'$  97035 (Ultrasound) '[x]'$  97750 (Physical Performance Training) '[x]'$  [id number] (Aquatic Therapy) '[]'$  97016 (Vasopneumatic Device) '[]'$  [id number] (Paraffin) '[]'$  97034 (Contrast Bath) '[]'$  97597 (Wound Care 1st 20 sq cm) '[]'$  97598 (Wound Care  each add'l 20 sq cm) '[x]'$  97760 (Orthotic Fabrication, Fitting, Training Initial) '[]'$  [id number] (Prosthetic Management and Training Initial) '[]'$  [id number] (Orthotic or Prosthetic Training/ Modification Subsequent)

## 2022-03-01 DIAGNOSIS — M0589 Other rheumatoid arthritis with rheumatoid factor of multiple sites: Secondary | ICD-10-CM | POA: Diagnosis not present

## 2022-03-06 ENCOUNTER — Ambulatory Visit (HOSPITAL_BASED_OUTPATIENT_CLINIC_OR_DEPARTMENT_OTHER): Payer: Medicare PPO | Admitting: Physical Therapy

## 2022-03-06 ENCOUNTER — Encounter (HOSPITAL_BASED_OUTPATIENT_CLINIC_OR_DEPARTMENT_OTHER): Payer: Self-pay | Admitting: Physical Therapy

## 2022-03-06 DIAGNOSIS — M6281 Muscle weakness (generalized): Secondary | ICD-10-CM

## 2022-03-06 DIAGNOSIS — M5459 Other low back pain: Secondary | ICD-10-CM

## 2022-03-06 DIAGNOSIS — M25551 Pain in right hip: Secondary | ICD-10-CM | POA: Diagnosis not present

## 2022-03-06 DIAGNOSIS — R2689 Other abnormalities of gait and mobility: Secondary | ICD-10-CM

## 2022-03-06 NOTE — Therapy (Signed)
OUTPATIENT PHYSICAL THERAPY THORACOLUMBAR EVALUATION   Patient Name: Wanda Lang MRN: [id number] DOB:08-26-1950, 72 y.o., female Today's Date: 03/06/2022  END OF SESSION:  PT End of Session - 03/06/22 1554     Visit Number 3    Number of Visits 20    Date for PT Re-Evaluation 04/26/22    Authorization Type humana    Progress Note Due on Visit 10    PT Start Time 1400    PT Stop Time 1445    PT Time Calculation (min) 45 min    Activity Tolerance Patient tolerated treatment well;Patient limited by pain    Behavior During Therapy WFL for tasks assessed/performed             Past Medical History:  Diagnosis Date   Allergy    Arthritis    Chronic kidney disease    Coronary artery disease    GERD (gastroesophageal reflux disease)    Heart murmur    Hypertension    LIVER FUNCTION TESTS, ABNORMAL, HX OF 09/08/2007   Qualifier: Diagnosis of  By: Harlon Ditty CMA (AAMA), Dottie     Past Surgical History:  Procedure Laterality Date   ABDOMINAL HYSTERECTOMY     COLONOSCOPY WITH PROPOFOL N/A 09/04/2018   Procedure: COLONOSCOPY WITH PROPOFOL;  Surgeon: Virgel Manifold, MD;  Location: [facility];  Service: Endoscopy;  Laterality: N/A;   ESOPHAGOGASTRODUODENOSCOPY (EGD) WITH PROPOFOL N/A 09/04/2018   Procedure: ESOPHAGOGASTRODUODENOSCOPY (EGD) WITH PROPOFOL;  Surgeon: Virgel Manifold, MD;  Location: [facility];  Service: Endoscopy;  Laterality: N/A;   LAPAROSCOPY     TONSILLECTOMY     TOTAL KNEE ARTHROPLASTY Left 07/18/2021   Procedure: TOTAL KNEE ARTHROPLASTY;  Surgeon: Paralee Cancel, MD;  Location: [facility];  Service: Orthopedics;  Laterality: Left;   TURBINATE REDUCTION     Patient Active Problem List   Diagnosis Date Noted   Dysuria 01/07/2022   Postmenopausal estrogen deficiency 11/13/2021   Leg length inequality 11/06/2021   Impaired ambulation 10/23/2021   Contact dermatitis 10/16/2021   SI joint arthritis 08/24/2021   Spinal stenosis, lumbar  region, without neurogenic claudication 08/24/2021   Degeneration of lumbar intervertebral disc 07/24/2021   Stiffness of left knee 07/21/2021   S/P total knee arthroplasty, left 07/18/2021   Preop examination 07/07/2021   Right hip pain 07/07/2021   Low back pain 07/07/2021   Drug-induced immunodeficiency ([place]) 06/12/2021   Impingement syndrome of right shoulder region 06/12/2021   Preop cardiovascular exam 06/08/2021   COVID-19 01/24/2021   Hammer toe 04/22/2020   Metatarsalgia of left foot 04/22/2020   Osteoarthritis of midfoot 04/22/2020   Duodenal ulcer 11/12/2019   Stress 11/12/2019   Morbid obesity ([place]) 11/06/2019   Pain in left knee 07/30/2019   Nonrheumatic aortic valve stenosis 04/14/2018   Heart murmur 02/06/2018   Maxillary sinusitis, chronic 10/24/2017   Chronic diarrhea 10/09/2017   Acid reflux 07/15/2017   Asymptomatic carotid artery stenosis 07/15/2017   Cyst of skin 07/15/2017   Immunocompromised ([place]) 11/09/2016   Allergic rhinitis 11/02/2016   Tremor 11/02/2016   History of hypertension 11/02/2016   Postmenopausal symptoms 07/01/2015   Seasonal allergic rhinitis due to pollen 07/01/2015   Fatty liver 09/08/2007   Rheumatoid arthritis ([place]) 09/08/2007    PCP: Leone Haven, MD   REFERRING PROVIDER: Charlett Blake, MD     REFERRING DIAG: [phone] (ICD-10-CM) - Right hip pain   Rationale for Evaluation and Treatment: Rehabilitation  THERAPY DIAG:  Pain in right hip  Other low back pain  Muscle weakness (generalized)  Other abnormalities of gait and mobility  ONSET DATE: ~ 6 months  SUBJECTIVE:                                                                                                                                                                                           SUBJECTIVE STATEMENT:  "I was definitely sore after last session a lot the first day then just a little the second.  I do think I have more movement in my  hip."   L TKR in June 2023.  Healed well.  Was using walking stick but then all of a sudden I have a leg length difference.  MD sent me to pain management where they started putting in cortizone shots for back pain. Had 3-4 injections in SI with the last one helping. I have had a lift built onto my shoe for past few months.  My SI feels better since.   PAIN:  Are you having pain? Yes: NPRS scale: currently 6/10  worst 8/10 Pain location: Right SI through posterior and anterior hip Pain description: sharp quality to ache Aggravating factors: weight bearing Relieving factors: Oral meds,lidocaine patches, TENS sitting, lying down  PRECAUTIONS: Fall  WEIGHT BEARING RESTRICTIONS: No  FALLS:  Has patient fallen in last 6 months? No  LIVING ENVIRONMENT: Lives with: lives alone Lives in: House/apartment Stairs:  2 steps into sunk in living room to kitchen and bathroom.  Has a hand rail Has following equipment at home: Gilford Rile - 2 wheeled  OCCUPATION: retired Pharmacist, hospital  PLOF: Independent with household mobility with device  PATIENT GOALS: walk better, decrease pain, be able to transfer (car, into bed) better, get around home safer  NEXT MD VISIT: Feb Dr Letta Pate  OBJECTIVE:   DIAGNOSTIC FINDINGS:   IMPRESSION: x-ray 02/24/22 1. Osteolysis of the right femoral head which has significantly progressed compared with 06/16/2019 and severe right hip joint space narrowing as can be seen with rapidly progressive osteoarthritis. Septic arthritis is considered much less likely.   MRI lumbar spine 10/23/21 IMPRESSION: 1. Widespread lumbar spine degeneration with no acute osseous abnormality. Mild scoliosis and multilevel spondylolisthesis   2. Mild to moderate multifactorial spinal stenosis at L2-L3. Mild left lateral recess stenosis at L1-L2 and L4-L5. Up to moderate neural foraminal stenosis at the left L2, right L3, left L4 and left L5 nerve levels.   3. Partially visible distended  gallbladder. Query right upper quadrant pain.  PATIENT SURVEYS:  FOTO 26% with goal of 51 at visit 15   COGNITION: Overall cognitive status: Within functional limits for tasks assessed  SENSATION: WFL  MUSCLE LENGTH: Hamstrings: wfl   POSTURE: rounded shoulders, weight shift left, and right hip slightly higher than left  PALPATION: Right hip At gr trochanter TTP  LUMBAR ROM:   Limited with side bending Increased hip pain  LOWER EXTREMITY ROM:     Active  Right eval Left eval  Hip flexion 40d active 90 passive 110 active  Hip extension    Hip abduction    Hip adduction    Hip internal rotation 20   Hip external rotation 30   Knee flexion 110 120  Knee extension 0 0 stiff  Ankle dorsiflexion wfl wfl  Ankle plantarflexion    Ankle inversion    Ankle eversion     (Blank rows = not tested)  LOWER EXTREMITY MMT:    MMT Right eval Left eval  Hip flexion 3- 3+  Hip extension    Hip abduction 3- 5  Hip adduction 4 5  Hip internal rotation    Hip external rotation    Knee flexion 5 4+  Knee extension 5 4+  Ankle dorsiflexion    Ankle plantarflexion    Ankle inversion    Ankle eversion     (Blank rows = not tested)  LUMBAR SPECIAL TESTS:  Marcello Moores test: Negative  FUNCTIONAL TESTS:  TBA at later date as tolerated  GAIT: Distance walked: 419f Assistive device utilized: WEnvironmental consultant- 2 wheeled Level of assistance: Modified independence Comments: decreased stance time lle, right leaning, lack of right hip flex just clearing right foot from floor. Cadence slowed and antalgic  TODAY'S TREATMENT:                                                                                                                              Pt seen for aquatic therapy today.  Treatment took place in water 3.25-4.5 ft in depth at the [facility] Temp of water was 91.  Pt entered/exited the pool via lift.   *Seated on lift: cycling; add/abd and SLR x15  *standing ue  on wall 3.670fwater brick under rle for leg length difference: marching; hip extension; add/abd; hip flex stretch; hip circle rle only cw and ccw *Walking forward 3.6 ft ue support blue barbell multiple widths. Moved to 4.0 ft added 2 widths backward and 2 side stepping *Standing ue on wall one on barbell right hip circles small diameter. DF; PF; right hip abd *Side stepping x 6 widths ue support barbell.   Pt requires the buoyancy and hydrostatic pressure of water for support, and to offload joints by unweighting joint load by at least 50 % in navel deep water and by at least 75-80% in chest to neck deep water.  Viscosity of the water is needed for resistance of strengthening. Water current perturbations provides challenge to standing balance requiring increased core activation.    PATIENT EDUCATION:  Education details: Discussed eval findings, rehab rationale and POC and patient is in agreement Person  educated: Patient Education method: Explanation Education comprehension: verbalized understanding and returned demonstration  HOME EXERCISE PROGRAM: Access Code: [email or web address] URL: [email or web address] Date: 02/15/2022 Prepared by: Denton Meek  Exercises - Supine Posterior Pelvic Tilt  - 1 x daily - 7 x weekly - 3 sets - 10 reps - Supine Single Leg Ankle Pumps  - 1 x daily - 7 x weekly - 3 sets - 10 reps - Supine Heel Slide  - 1 x daily - 7 x weekly - 3 sets - 10 reps - Supine Hip Abduction  - 1 x daily - 7 x weekly - 3 sets - 10 reps - Active Straight Leg Raise with Quad Set  - 1 x daily - 7 x weekly - 3 sets - 10 reps  ASSESSMENT:  CLINICAL IMPRESSION: Pt with positive response to last session.  Reports some muscle soreness but improved range in right hip.  She is pleased with initial outcome.  She tolerates progression of stretching and strengthening today. Used water brick to level her hips with standing exercises.  Good toleration, reduction in initial discomfort upon  completion.  Goals ongoing    Initial assessment Patient is a 72 y.o. F who was seen today for physical therapy evaluation and treatment for R hip pain.   She reports she has lost 65 lb since Last January needed to have left TKR.  She presents today using FWW and has a lifted right shoe.  Pt reports SI and LBP as well as right hip pain.  Leg length difference since TKR but I am suspicious she has had an advanced right hip OA which may have caused the shortening.  She does report a slight lumbar scoliosis but does not appear to be significant. Right hip strength deficits along with IR and ER limitations in range.  Right SI pain improved when lift was added to shoe. She has an order for an xray right hip. She will benefit from skilled PT physical therapy beginning with aquatic intervention progressing to land based when appropriate.  She will progress with improved tolerance using the properties of water to improve strength, ROM, decrease pain, improve balance/gait translating to improvement in ADL's and functional mobility.  OBJECTIVE IMPAIRMENTS: Abnormal gait, decreased activity tolerance, decreased balance, decreased endurance, decreased mobility, difficulty walking, decreased ROM, decreased strength, decreased safety awareness, increased muscle spasms, impaired flexibility, improper body mechanics, postural dysfunction, obesity, and pain.   ACTIVITY LIMITATIONS: carrying, lifting, bending, sitting, standing, squatting, sleeping, stairs, transfers, bed mobility, and locomotion level  PARTICIPATION LIMITATIONS: meal prep, cleaning, laundry, driving, shopping, community activity, occupation, and yard work  PERSONAL FACTORS: Age, Fitness, Past/current experiences, Time since onset of injury/illness/exacerbation, and 1-2 comorbidities: obesity, leg length difference  are also affecting patient's functional outcome.   REHAB POTENTIAL: Good  CLINICAL DECISION MAKING: Evolving/moderate  complexity  EVALUATION COMPLEXITY: Moderate   GOALS: Goals reviewed with patient? Yes  SHORT TERM GOALS: Target date: 03/22/22  Pt will tolerate aquatic therapy sessions without increase in pain to demonstrate positive response to intervention Baseline:TBA Goal status: INITIAL  2.  Pt will tolerate driving without increase in pain Baseline: barely tolerable Goal status: INITIAL  3.  Pt will be able to move right foot on and off of car pedals with ease. Baseline: much difficulty and pain Goal status: INITIAL  4.  Pt will improve rle hip flex and abd strength to 3+/5 or > to demonstrate the safe ability to leverage RLE with all transfers with improved safety. Baseline: 3- Goal  status: INITIAL  5.  Pt to be indep and safe with putting fww into car then getting into drivers side Baseline: "worse thing I do very unsteady" Goal status: INITIAL  6.  Pt will use stairs into and out of pool to improve strength with completion improving safety with stairs at home. Baseline:  Goal status: INITIAL  LONG TERM GOALS: Target date: 04/26/22  Foto goal of 51 will be met to demonstrate pt improved perception of her functional mobility Baseline: 26% Goal status: INITIAL  2.  Pt to decrease max pain to <or=4/10 to demonstrate improvement in management of condition. Baseline: 8/10 Goal status: INITIAL  3.  Pt will be able to lift her rle into bed without UE assistance demonstrating an improvement in strength Baseline: UE lifts rle into bed Goal status: INITIAL  4.  Pt will be indep with final HEP (land and/or aquatic as indicated) for long term management of condition Baseline: basic Sup exercises Goal status: INITIAL  5.  Pt will improve strength in all weak areas up at least 1 full grade to improve functional mobility and ADL's Baseline: see chart   PLAN:  PT FREQUENCY: 1-2x/week  PT DURATION: 10 weeks  PLANNED INTERVENTIONS: Therapeutic exercises, Therapeutic activity,  Neuromuscular re-education, Balance training, Gait training, Patient/Family education, Self Care, Joint mobilization, Joint manipulation, Stair training, Orthotic/Fit training, DME instructions, Aquatic Therapy, Dry Needling, Electrical stimulation, Cryotherapy, Moist heat, scar mobilization, Splintting, Taping, Ultrasound, Parrafin, Biofeedback, Ionotophoresis '4mg'$ /ml Dexamethasone, Manual therapy, and Re-evaluation.  PLAN FOR NEXT SESSION: Aquatics for strengthening LE/core; gait and balance retraining; stair climbing, aerobic capacity improvement; Pt edu/training, sup <>sit   Denton Meek, PTMPT 03/06/2022, 3:55 PM   Referring diagnosis? R hip pain Treatment diagnosis? (if different than referring diagnosis) right hip pain What was this (referring dx) caused by? '[]'$  Surgery '[]'$  Fall '[x]'$  Ongoing issue '[x]'$  Arthritis '[]'$  Other: ____________  Laterality: '[]'$  Rt '[]'$  Lt '[x]'$  Both  Check all possible CPT codes:  *CHOOSE 10 OR LESS*    '[x]'$  97110 (Therapeutic Exercise)  '[]'$  92507 (SLP Treatment)  '[]'$  97112 (Neuro Re-ed)   '[]'$  92526 (Swallowing Treatment)   '[x]'$  97116 (Gait Training)   '[]'$  [id number] (Cognitive Training, 1st 15 minutes) '[x]'$  97140 (Manual Therapy)   '[]'$  97130 (Cognitive Training, each add'l 15 minutes)  '[x]'$  97164 (Re-evaluation)                              '[]'$  Other, List CPT Code ____________  '[x]'$  97530 (Therapeutic Activities)     '[x]'$  97535 (Self Care)   '[]'$  All codes above (97110 - 97535)  '[]'$  97012 (Mechanical Traction)  '[x]'$  97014 (E-stim Unattended)  '[x]'$  97032 (E-stim manual)  '[x]'$  97033 (Ionto)  '[x]'$  97035 (Ultrasound) '[x]'$  97750 (Physical Performance Training) '[x]'$  [id number] (Aquatic Therapy) '[]'$  97016 (Vasopneumatic Device) '[]'$  [id number] (Paraffin) '[]'$  97034 (Contrast Bath) '[]'$  97597 (Wound Care 1st 20 sq cm) '[]'$  97598 (Wound Care each add'l 20 sq cm) '[x]'$  97760 (Orthotic Fabrication, Fitting, Training Initial) '[]'$  [id number] (Prosthetic Management and Training Initial) '[]'$  [id number] (Orthotic or  Prosthetic Training/ Modification Subsequent)

## 2022-03-08 ENCOUNTER — Encounter: Payer: Self-pay | Admitting: Physical Medicine & Rehabilitation

## 2022-03-08 ENCOUNTER — Ambulatory Visit (HOSPITAL_BASED_OUTPATIENT_CLINIC_OR_DEPARTMENT_OTHER): Payer: Medicare PPO | Attending: Physical Medicine & Rehabilitation | Admitting: Physical Therapy

## 2022-03-08 ENCOUNTER — Encounter: Payer: Medicare PPO | Admitting: Physical Medicine & Rehabilitation

## 2022-03-08 ENCOUNTER — Encounter (HOSPITAL_BASED_OUTPATIENT_CLINIC_OR_DEPARTMENT_OTHER): Payer: Self-pay | Admitting: Physical Therapy

## 2022-03-08 VITALS — BP 113/73 | HR 77 | Ht 64.0 in | Wt 195.0 lb

## 2022-03-08 DIAGNOSIS — R2689 Other abnormalities of gait and mobility: Secondary | ICD-10-CM | POA: Diagnosis not present

## 2022-03-08 DIAGNOSIS — M5459 Other low back pain: Secondary | ICD-10-CM | POA: Diagnosis not present

## 2022-03-08 DIAGNOSIS — M6281 Muscle weakness (generalized): Secondary | ICD-10-CM | POA: Diagnosis not present

## 2022-03-08 DIAGNOSIS — M25551 Pain in right hip: Secondary | ICD-10-CM | POA: Diagnosis not present

## 2022-03-08 NOTE — Progress Notes (Signed)
Subjective:    Patient ID: Wanda Lang, female    DOB: Nov 24, 1950, 72 y.o.   MRN: [id number]  HPI 72 year old female with chronic back and hip pain who returns today to follow-up on x-ray results from last week. Patient continues to complain primarily of groin pain although she also has pain in her right buttock area when she sits down.  She said tends to sit more on the left buttocks cheek.  She has a history of leg length discrepancy which she attributed to knee replacement surgery.  She has followed up with her orthopedist Dr. Alvan Dame who stated there was no hardware malpositioning in the left lower extremity knee.  The patient has some partial relief with a built-up shoe in the right foot. She has no numbness or tingling in her lower extremities. She sees Dr. Nelva Bush for chronic pain management and is also injections with [facility] radiology, L5-S1 epidural on 08/09/2021 which resulted in minimal improvements. Right sacroiliac injection at this clinic 09/28/2021 produced 100% relief that lasted around 2 weeks of the posterior hip pain.  Her pain today is mainly in the right groin.  It is worse when she stands.  She has received some physical therapy without improvement.  She is on narcotic analgesics prescribed at [facility]. DG HIP (WITH OR WITHOUT PELVIS) 2-3V RIGHT   COMPARISON:  Right SI joint injection CT 10/24/2021   Hip x-ray 06/16/2019   FINDINGS: Generalized osteopenia. No acute fracture or dislocation. Osteolysis of the right femoral head which has significantly progressed compared with 06/16/2019 and severe right hip joint space narrowing as can be seen with rapidly progressive osteoarthritis. No aggressive osseous lesion. Normal alignment.   Soft tissue are unremarkable. No radiopaque foreign body or soft tissue emphysema.   IMPRESSION: 1. Osteolysis of the right femoral head which has significantly progressed compared with 06/16/2019 and severe right hip joint  space narrowing as can be seen with rapidly progressive osteoarthritis. Septic arthritis is considered much less likely.     Electronically Signed   By: Kathreen Devoid M.D.   On: 02/24/2022 09:23   Pain Inventory Average Pain 5 Pain Right Now 7 My pain is constant, sharp, stabbing, and aching  In the last 24 hours, has pain interfered with the following? General activity 8 Relation with others 2 Enjoyment of life 8 What TIME of day is your pain at its worst? varies Sleep (in general) Fair  Pain is worse with: walking, bending, sitting, standing, and some activites Pain improves with: rest, heat/ice, medication, and TENS Relief from Meds: 7  Family History  Problem Relation Age of Onset   Alcohol abuse Mother    Arthritis Mother    Hyperlipidemia Mother    Heart disease Mother    Stroke Mother    Hypertension Mother    Depression Mother    Anxiety disorder Mother    Arthritis Father    Lung cancer Paternal Grandfather    Kidney disease Paternal Grandfather    Social History   Socioeconomic History   Marital status: Widowed    Spouse name: Not on file   Number of children: Not on file   Years of education: Not on file   Highest education level: Not on file  Occupational History   Not on file  Tobacco Use   Smoking status: Never   Smokeless tobacco: Never  Vaping Use   Vaping Use: Never used  Substance and Sexual Activity   Alcohol use: Yes    Alcohol/week:  1.0 standard drink of alcohol    Types: 1 Glasses of wine per week   Drug use: No   Sexual activity: Never  Other Topics Concern   Not on file  Social History Narrative   Not on file   Social Determinants of Health   Financial Resource Strain: Low Risk  (07/13/2021)   Overall Financial Resource Strain (CARDIA)    Difficulty of Paying Living Expenses: Not hard at all  Food Insecurity: No Food Insecurity (12/05/2021)   Hunger Vital Sign    Worried About Running Out of Food in the Last Year: Never true     Ran Out of Food in the Last Year: Never true  Transportation Needs: No Transportation Needs (12/05/2021)   PRAPARE - Hydrologist (Medical): No    Lack of Transportation (Non-Medical): No  Physical Activity: Not on file  Stress: No Stress Concern Present (07/13/2021)   [place]    Feeling of Stress : Not at all  Social Connections: Unknown (07/13/2021)   Social Connection and Isolation Panel [NHANES]    Frequency of Communication with Friends and Family: More than three times a week    Frequency of Social Gatherings with Friends and Family: More than three times a week    Attends Religious Services: More than 4 times per year    Active Member of [employer] or Organizations: Not on file    Attends Archivist Meetings: Not on file    Marital Status: Widowed   Past Surgical History:  Procedure Laterality Date   ABDOMINAL HYSTERECTOMY     COLONOSCOPY WITH PROPOFOL N/A 09/04/2018   Procedure: COLONOSCOPY WITH PROPOFOL;  Surgeon: Virgel Manifold, MD;  Location: [facility];  Service: Endoscopy;  Laterality: N/A;   ESOPHAGOGASTRODUODENOSCOPY (EGD) WITH PROPOFOL N/A 09/04/2018   Procedure: ESOPHAGOGASTRODUODENOSCOPY (EGD) WITH PROPOFOL;  Surgeon: Virgel Manifold, MD;  Location: [facility];  Service: Endoscopy;  Laterality: N/A;   LAPAROSCOPY     TONSILLECTOMY     TOTAL KNEE ARTHROPLASTY Left 07/18/2021   Procedure: TOTAL KNEE ARTHROPLASTY;  Surgeon: Paralee Cancel, MD;  Location: [facility];  Service: Orthopedics;  Laterality: Left;   TURBINATE REDUCTION     Past Surgical History:  Procedure Laterality Date   ABDOMINAL HYSTERECTOMY     COLONOSCOPY WITH PROPOFOL N/A 09/04/2018   Procedure: COLONOSCOPY WITH PROPOFOL;  Surgeon: Virgel Manifold, MD;  Location: [facility];  Service: Endoscopy;  Laterality: N/A;   ESOPHAGOGASTRODUODENOSCOPY (EGD) WITH PROPOFOL N/A 09/04/2018    Procedure: ESOPHAGOGASTRODUODENOSCOPY (EGD) WITH PROPOFOL;  Surgeon: Virgel Manifold, MD;  Location: [facility];  Service: Endoscopy;  Laterality: N/A;   LAPAROSCOPY     TONSILLECTOMY     TOTAL KNEE ARTHROPLASTY Left 07/18/2021   Procedure: TOTAL KNEE ARTHROPLASTY;  Surgeon: Paralee Cancel, MD;  Location: [facility];  Service: Orthopedics;  Laterality: Left;   TURBINATE REDUCTION     Past Medical History:  Diagnosis Date   Allergy    Arthritis    Chronic kidney disease    Coronary artery disease    GERD (gastroesophageal reflux disease)    Heart murmur    Hypertension    LIVER FUNCTION TESTS, ABNORMAL, HX OF 09/08/2007   Qualifier: Diagnosis of  By: Nelson-Smith CMA (AAMA), Dottie     BP 113/73   Pulse 77   Ht '5\' 4"'$  (1.626 m)   Wt 195 lb (88.5 kg)   SpO2 97%  BMI 33.47 kg/m   Opioid Risk Score:   Fall Risk Score:  `1  Depression screen [facility] 2/9     03/08/2022   10:01 AM 02/02/2022    1:41 PM 10/16/2021    4:01 PM 08/24/2021    9:21 AM 07/13/2021    8:55 AM 07/07/2021   11:54 AM 11/30/2020    9:47 AM  Depression screen PHQ 2/9  Decreased Interest 0 0 0 0 0 0 0  Down, Depressed, Hopeless 0 0 0 0 0 0 0  PHQ - 2 Score 0 0 0 0 0 0 0  Altered sleeping    0     Tired, decreased energy    1     Change in appetite    0     Feeling bad or failure about yourself     0     Trouble concentrating    0     Moving slowly or fidgety/restless    0     Suicidal thoughts    0     PHQ-9 Score    1     Difficult doing work/chores    Not difficult at all         Review of Systems  Musculoskeletal:        Righ hip/buttock pain   All other systems reviewed and are negative.     Objective:   Physical Exam Obese female no acute distress The patient has tenderness over the right ischium There is no pelvic obliquity noted with standing with her corrective shoe wear. No tenderness of the R lateral hip Negative straight leg raising bilaterally Motor strength is 5/5 bilateral knee  extensors ankle dorsiflexors.  Has pain that inhibits right hip flexion.  Pain with right hip internal/external rotation with minimal movement and pronounced crepitus      Assessment & Plan:   1.  Severe osteoarthritis right hip progressive.  This is also resulting in leg length discrepancy.  She has concordant right groin pain.  She has tried conservative care without improvement.  Would not recommend right hip intra-articular injection at this time due to risk of further collapse. Patient wanted to see an orthopedic surgeon in the [facility] system for an opinion.  Will make referral to Dr. Zollie Beckers.  I would anticipate much of her pain should subside postoperatively over time.  She will follow-up with Dr. Nelva Bush for medication management and lumbar pain management.  I would be happy to see her in future if need be.

## 2022-03-08 NOTE — Therapy (Signed)
OUTPATIENT PHYSICAL THERAPY THORACOLUMBAR TREATMENT   Patient Name: Wanda Lang MRN: [id number] DOB:12-20-1950, 72 y.o., female Today's Date: 03/08/2022  END OF SESSION:  PT End of Session - 03/08/22 1457     Visit Number 4    Number of Visits 20    Date for PT Re-Evaluation 04/26/22    Authorization Type humana    Progress Note Due on Visit 10    PT Start Time 1452   pt arrived late to pool area (transport and changing into pool clothes)   PT Stop Time 1530    PT Time Calculation (min) 38 min             Past Medical History:  Diagnosis Date   Allergy    Arthritis    Chronic kidney disease    Coronary artery disease    GERD (gastroesophageal reflux disease)    Heart murmur    Hypertension    LIVER FUNCTION TESTS, ABNORMAL, HX OF 09/08/2007   Qualifier: Diagnosis of  By: Harlon Ditty CMA (AAMA), Dottie     Past Surgical History:  Procedure Laterality Date   ABDOMINAL HYSTERECTOMY     COLONOSCOPY WITH PROPOFOL N/A 09/04/2018   Procedure: COLONOSCOPY WITH PROPOFOL;  Surgeon: Virgel Manifold, MD;  Location: [facility];  Service: Endoscopy;  Laterality: N/A;   ESOPHAGOGASTRODUODENOSCOPY (EGD) WITH PROPOFOL N/A 09/04/2018   Procedure: ESOPHAGOGASTRODUODENOSCOPY (EGD) WITH PROPOFOL;  Surgeon: Virgel Manifold, MD;  Location: [facility];  Service: Endoscopy;  Laterality: N/A;   LAPAROSCOPY     TONSILLECTOMY     TOTAL KNEE ARTHROPLASTY Left 07/18/2021   Procedure: TOTAL KNEE ARTHROPLASTY;  Surgeon: Paralee Cancel, MD;  Location: [facility];  Service: Orthopedics;  Laterality: Left;   TURBINATE REDUCTION     Patient Active Problem List   Diagnosis Date Noted   Dysuria 01/07/2022   Postmenopausal estrogen deficiency 11/13/2021   Leg length inequality 11/06/2021   Impaired ambulation 10/23/2021   Contact dermatitis 10/16/2021   SI joint arthritis 08/24/2021   Spinal stenosis, lumbar region, without neurogenic claudication 08/24/2021   Degeneration of  lumbar intervertebral disc 07/24/2021   Stiffness of left knee 07/21/2021   S/P total knee arthroplasty, left 07/18/2021   Preop examination 07/07/2021   Right hip pain 07/07/2021   Low back pain 07/07/2021   Drug-induced immunodeficiency ([place]) 06/12/2021   Impingement syndrome of right shoulder region 06/12/2021   Preop cardiovascular exam 06/08/2021   COVID-19 01/24/2021   Hammer toe 04/22/2020   Metatarsalgia of left foot 04/22/2020   Osteoarthritis of midfoot 04/22/2020   Duodenal ulcer 11/12/2019   Stress 11/12/2019   Morbid obesity ([place]) 11/06/2019   Pain in left knee 07/30/2019   Nonrheumatic aortic valve stenosis 04/14/2018   Heart murmur 02/06/2018   Maxillary sinusitis, chronic 10/24/2017   Chronic diarrhea 10/09/2017   Acid reflux 07/15/2017   Asymptomatic carotid artery stenosis 07/15/2017   Cyst of skin 07/15/2017   Immunocompromised ([place]) 11/09/2016   Allergic rhinitis 11/02/2016   Tremor 11/02/2016   History of hypertension 11/02/2016   Postmenopausal symptoms 07/01/2015   Seasonal allergic rhinitis due to pollen 07/01/2015   Fatty liver 09/08/2007   Rheumatoid arthritis ([place]) 09/08/2007    PCP: Leone Haven, MD   REFERRING PROVIDER: Charlett Blake, MD     REFERRING DIAG: [id number] (ICD-10-CM) - Right hip pain   Rationale for Evaluation and Treatment: Rehabilitation  THERAPY DIAG:  Pain in right hip  Other low back pain  Muscle weakness (generalized)  Other abnormalities of gait and mobility  ONSET DATE: ~ 6 months  SUBJECTIVE:                                                                                                                                                                                           SUBJECTIVE STATEMENT:  Pt reports she went to doctor today, "he said my Rt hip is destroyed and is referring me to a surgeon"    PAIN:  Are you having pain? Yes: NPRS scale: currently 6/10  worst 8/10 Pain location: Right SI  through posterior and anterior hip Pain description: sharp quality to ache Aggravating factors: weight bearing Relieving factors: Oral meds,lidocaine patches, TENS sitting, lying down  PRECAUTIONS: Fall  WEIGHT BEARING RESTRICTIONS: No  FALLS:  Has patient fallen in last 6 months? No  LIVING ENVIRONMENT: Lives with: lives alone Lives in: House/apartment Stairs:  2 steps into sunk in living room to kitchen and bathroom.  Has a hand rail Has following equipment at home: Gilford Rile - 2 wheeled  OCCUPATION: retired Pharmacist, hospital  PLOF: Independent with household mobility with device  PATIENT GOALS: walk better, decrease pain, be able to transfer (car, into bed) better, get around home safer  NEXT MD VISIT: Feb Dr Letta Pate  OBJECTIVE:   DIAGNOSTIC FINDINGS:   IMPRESSION: x-ray 02/24/22 1. Osteolysis of the right femoral head which has significantly progressed compared with 06/16/2019 and severe right hip joint space narrowing as can be seen with rapidly progressive osteoarthritis. Septic arthritis is considered much less likely.   MRI lumbar spine 10/23/21 IMPRESSION: 1. Widespread lumbar spine degeneration with no acute osseous abnormality. Mild scoliosis and multilevel spondylolisthesis   2. Mild to moderate multifactorial spinal stenosis at L2-L3. Mild left lateral recess stenosis at L1-L2 and L4-L5. Up to moderate neural foraminal stenosis at the left L2, right L3, left L4 and left L5 nerve levels.   3. Partially visible distended gallbladder. Query right upper quadrant pain.  PATIENT SURVEYS:  FOTO 26% with goal of 51 at visit 15   COGNITION: Overall cognitive status: Within functional limits for tasks assessed     SENSATION: WFL  MUSCLE LENGTH: Hamstrings: wfl   POSTURE: rounded shoulders, weight shift left, and right hip slightly higher than left  PALPATION: Right hip At gr trochanter TTP  LUMBAR ROM:   Limited with side bending Increased hip pain  LOWER  EXTREMITY ROM:     Active  Right eval Left eval  Hip flexion 40d active 90 passive 110 active  Hip extension    Hip abduction    Hip adduction    Hip internal rotation 20  Hip external rotation 30   Knee flexion 110 120  Knee extension 0 0 stiff  Ankle dorsiflexion wfl wfl  Ankle plantarflexion    Ankle inversion    Ankle eversion     (Blank rows = not tested)  LOWER EXTREMITY MMT:    MMT Right eval Left eval  Hip flexion 3- 3+  Hip extension    Hip abduction 3- 5  Hip adduction 4 5  Hip internal rotation    Hip external rotation    Knee flexion 5 4+  Knee extension 5 4+  Ankle dorsiflexion    Ankle plantarflexion    Ankle inversion    Ankle eversion     (Blank rows = not tested)  LUMBAR SPECIAL TESTS:  Marcello Moores test: Negative  FUNCTIONAL TESTS:  TBA at later date as tolerated  GAIT: Distance walked: 418f Assistive device utilized: WEnvironmental consultant- 2 wheeled Level of assistance: Modified independence Comments: decreased stance time lle, right leaning, lack of right hip flex just clearing right foot from floor. Cadence slowed and antalgic  TODAY'S TREATMENT:                                                                                                                              Pt seen for aquatic therapy today.  Treatment took place in water 3.5 ft in depth at the [facility] Temp of water was 91.  Pt entered/exited the pool via chair lift.   *Seated on chair lift: cycling; add/abd and SLR x15  *tanding UE on wall 3.628fwater, brick under RLE for leg length difference:  hip extension x 20; add/abd x 20; marching in place; heel raises x 10; small hip circles CW/CCW RLE  * sitting on yellow noodle:  stool scoots forward/ backward   * walking forward/ backward holding barbell - moved to deeper water for improved tolerance    * straddling noodle:  cycling, hip abdct/addct, cc ski - small range   Pt requires the buoyancy and hydrostatic pressure of  water for support, and to offload joints by unweighting joint load by at least 50 % in navel deep water and by at least 75-80% in chest to neck deep water.  Viscosity of the water is needed for resistance of strengthening. Water current perturbations provides challenge to standing balance requiring increased core activation.    PATIENT EDUCATION:  Education details: aquatics modifications/ progressions  Person educated: Patient Education method: Explanation Education comprehension: verbalized understanding and returned demonstration  HOME EXERCISE PROGRAM: Access Code: [email or web address]: [email or web address] Date: 02/15/2022 Prepared by: FrDenton MeekExercises - Supine Posterior Pelvic Tilt  - 1 x daily - 7 x weekly - 3 sets - 10 reps - Supine Single Leg Ankle Pumps  - 1 x daily - 7 x weekly - 3 sets - 10 reps - Supine Heel Slide  - 1 x daily - 7 x weekly - 3 sets - 10 reps - Supine Hip  Abduction  - 1 x daily - 7 x weekly - 3 sets - 10 reps - Active Straight Leg Raise with Quad Set  - 1 x daily - 7 x weekly - 3 sets - 10 reps  ASSESSMENT:  CLINICAL IMPRESSION: Pt reporting improved ROM in Rt hip since last visit.  Used  brick to level her hips with standing exercises.  Good toleration, reduction in initial discomfort upon completion.  Goals ongoing    Initial assessment Patient is a 72 y.o. F who was seen today for physical therapy evaluation and treatment for R hip pain.   She reports she has lost 65 lb since Last January needed to have left TKR.  She presents today using FWW and has a lifted right shoe.  Pt reports SI and LBP as well as right hip pain.  Leg length difference since TKR but I am suspicious she has had an advanced right hip OA which may have caused the shortening.  She does report a slight lumbar scoliosis but does not appear to be significant. Right hip strength deficits along with IR and ER limitations in range.  Right SI pain improved when lift was added  to shoe. She has an order for an xray right hip. She will benefit from skilled PT physical therapy beginning with aquatic intervention progressing to land based when appropriate.  She will progress with improved tolerance using the properties of water to improve strength, ROM, decrease pain, improve balance/gait translating to improvement in ADL's and functional mobility.  OBJECTIVE IMPAIRMENTS: Abnormal gait, decreased activity tolerance, decreased balance, decreased endurance, decreased mobility, difficulty walking, decreased ROM, decreased strength, decreased safety awareness, increased muscle spasms, impaired flexibility, improper body mechanics, postural dysfunction, obesity, and pain.   ACTIVITY LIMITATIONS: carrying, lifting, bending, sitting, standing, squatting, sleeping, stairs, transfers, bed mobility, and locomotion level  PARTICIPATION LIMITATIONS: meal prep, cleaning, laundry, driving, shopping, community activity, occupation, and yard work  PERSONAL FACTORS: Age, Fitness, Past/current experiences, Time since onset of injury/illness/exacerbation, and 1-2 comorbidities: obesity, leg length difference  are also affecting patient's functional outcome.   REHAB POTENTIAL: Good  CLINICAL DECISION MAKING: Evolving/moderate complexity  EVALUATION COMPLEXITY: Moderate   GOALS: Goals reviewed with patient? Yes  SHORT TERM GOALS: Target date: 03/22/22  Pt will tolerate aquatic therapy sessions without increase in pain to demonstrate positive response to intervention Baseline:TBA Goal status: INITIAL  2.  Pt will tolerate driving without increase in pain Baseline: barely tolerable Goal status: INITIAL  3.  Pt will be able to move right foot on and off of car pedals with ease. Baseline: much difficulty and pain Goal status: INITIAL  4.  Pt will improve rle hip flex and abd strength to 3+/5 or > to demonstrate the safe ability to leverage RLE with all transfers with improved  safety. Baseline: 3- Goal status: INITIAL  5.  Pt to be indep and safe with putting fww into car then getting into drivers side Baseline: "worse thing I do very unsteady" Goal status: INITIAL  6.  Pt will use stairs into and out of pool to improve strength with completion improving safety with stairs at home. Baseline:  Goal status: INITIAL  LONG TERM GOALS: Target date: 04/26/22  Foto goal of 51 will be met to demonstrate pt improved perception of her functional mobility Baseline: 26% Goal status: INITIAL  2.  Pt to decrease max pain to <or=4/10 to demonstrate improvement in management of condition. Baseline: 8/10 Goal status: INITIAL  3.  Pt will  be able to lift her rle into bed without UE assistance demonstrating an improvement in strength Baseline: UE lifts rle into bed Goal status: INITIAL  4.  Pt will be indep with final HEP (land and/or aquatic as indicated) for long term management of condition Baseline: basic Sup exercises Goal status: INITIAL  5.  Pt will improve strength in all weak areas up at least 1 full grade to improve functional mobility and ADL's Baseline: see chart   PLAN:  PT FREQUENCY: 1-2x/week  PT DURATION: 10 weeks  PLANNED INTERVENTIONS: Therapeutic exercises, Therapeutic activity, Neuromuscular re-education, Balance training, Gait training, Patient/Family education, Self Care, Joint mobilization, Joint manipulation, Stair training, Orthotic/Fit training, DME instructions, Aquatic Therapy, Dry Needling, Electrical stimulation, Cryotherapy, Moist heat, scar mobilization, Splintting, Taping, Ultrasound, Parrafin, Biofeedback, Ionotophoresis '4mg'$ /ml Dexamethasone, Manual therapy, and Re-evaluation.  PLAN FOR NEXT SESSION: Aquatics for strengthening LE/core; gait and balance retraining; stair climbing, aerobic capacity improvement; Pt edu/training, sup <>sit  Kerin Perna, PTA 03/08/22 6:38 PM [facility] [address] [place], [place], [address] Phone: [phone]   Fax:  [phone]  Referring diagnosis? R hip pain Treatment diagnosis? (if different than referring diagnosis) right hip pain What was this (referring dx) caused by? '[]'$  Surgery '[]'$  Fall '[x]'$  Ongoing issue '[x]'$  Arthritis '[]'$  Other: ____________  Laterality: '[]'$  Rt '[]'$  Lt '[x]'$  Both  Check all possible CPT codes:  *CHOOSE 10 OR LESS*    '[x]'$  97110 (Therapeutic Exercise)  '[]'$  46503 (SLP Treatment)  '[]'$  97112 (Neuro Re-ed)   '[]'$  92526 (Swallowing Treatment)   '[x]'$  97116 (Gait Training)   '[]'$  54656 (Cognitive Training, 1st 15 minutes) '[x]'$  97140 (Manual Therapy)   '[]'$  97130 (Cognitive Training, each add'l 15 minutes)  '[x]'$  97164 (Re-evaluation)                              '[]'$  Other, List CPT Code ____________  '[x]'$  81275 (Therapeutic Activities)     '[x]'$  97535 (Self Care)   '[]'$  All codes above (97110 - 97535)  '[]'$  97012 (Mechanical Traction)  '[x]'$  97014 (E-stim Unattended)  '[x]'$  97032 (E-stim manual)  '[x]'$  97033 (Ionto)  '[x]'$  97035 (Ultrasound) '[x]'$  97750 (Physical Performance Training) '[x]'$  [id number] (Aquatic Therapy) '[]'$  97016 (Vasopneumatic Device) '[]'$  [id number] (Paraffin) '[]'$  97034 (Contrast Bath) '[]'$  97597 (Wound Care 1st 20 sq cm) '[]'$  97598 (Wound Care each add'l 20 sq cm) '[x]'$  97760 (Orthotic Fabrication, Fitting, Training Initial) '[]'$  [id number] (Prosthetic Management and Training Initial) '[]'$  [id number] (Orthotic or Prosthetic Training/ Modification Subsequent)

## 2022-03-08 NOTE — Patient Instructions (Signed)
Would recommend eval for total hip right side If there is residual pain post op , may consider Right L2 or right sacroiliac injection

## 2022-03-13 ENCOUNTER — Ambulatory Visit (HOSPITAL_BASED_OUTPATIENT_CLINIC_OR_DEPARTMENT_OTHER): Payer: Medicare PPO | Admitting: Physical Therapy

## 2022-03-13 ENCOUNTER — Encounter (HOSPITAL_BASED_OUTPATIENT_CLINIC_OR_DEPARTMENT_OTHER): Payer: Self-pay | Admitting: Physical Therapy

## 2022-03-13 DIAGNOSIS — M5459 Other low back pain: Secondary | ICD-10-CM

## 2022-03-13 DIAGNOSIS — M6281 Muscle weakness (generalized): Secondary | ICD-10-CM

## 2022-03-13 DIAGNOSIS — M25551 Pain in right hip: Secondary | ICD-10-CM

## 2022-03-13 NOTE — Therapy (Signed)
Pt changed appointment due to a funeral.  Was too late for session to be completed properly.

## 2022-03-15 ENCOUNTER — Ambulatory Visit (HOSPITAL_BASED_OUTPATIENT_CLINIC_OR_DEPARTMENT_OTHER): Payer: Medicare PPO | Admitting: Physical Therapy

## 2022-03-15 ENCOUNTER — Encounter (HOSPITAL_BASED_OUTPATIENT_CLINIC_OR_DEPARTMENT_OTHER): Payer: Self-pay | Admitting: Physical Therapy

## 2022-03-15 DIAGNOSIS — M25551 Pain in right hip: Secondary | ICD-10-CM | POA: Diagnosis not present

## 2022-03-15 DIAGNOSIS — M6281 Muscle weakness (generalized): Secondary | ICD-10-CM | POA: Diagnosis not present

## 2022-03-15 DIAGNOSIS — M5459 Other low back pain: Secondary | ICD-10-CM | POA: Diagnosis not present

## 2022-03-15 DIAGNOSIS — R2689 Other abnormalities of gait and mobility: Secondary | ICD-10-CM | POA: Diagnosis not present

## 2022-03-15 NOTE — Therapy (Signed)
OUTPATIENT PHYSICAL THERAPY THORACOLUMBAR TREATMENT   Patient Name: Wanda Lang MRN: [id number] DOB:11/09/1950, 72 y.o., female Today's Date: 03/15/2022  END OF SESSION:  PT End of Session - 03/15/22 1407     Visit Number 5    Number of Visits 20    Date for PT Re-Evaluation 04/26/22    Authorization Type humana    Progress Note Due on Visit 10    PT Start Time 1412   Pt arrives late   PT Stop Time 1445    PT Time Calculation (min) 33 min    Activity Tolerance Patient tolerated treatment well    Behavior During Therapy WFL for tasks assessed/performed             Past Medical History:  Diagnosis Date   Allergy    Arthritis    Chronic kidney disease    Coronary artery disease    GERD (gastroesophageal reflux disease)    Heart murmur    Hypertension    LIVER FUNCTION TESTS, ABNORMAL, HX OF 09/08/2007   Qualifier: Diagnosis of  By: Harlon Ditty CMA (AAMA), Dottie     Past Surgical History:  Procedure Laterality Date   ABDOMINAL HYSTERECTOMY     COLONOSCOPY WITH PROPOFOL N/A 09/04/2018   Procedure: COLONOSCOPY WITH PROPOFOL;  Surgeon: Virgel Manifold, MD;  Location: [facility];  Service: Endoscopy;  Laterality: N/A;   ESOPHAGOGASTRODUODENOSCOPY (EGD) WITH PROPOFOL N/A 09/04/2018   Procedure: ESOPHAGOGASTRODUODENOSCOPY (EGD) WITH PROPOFOL;  Surgeon: Virgel Manifold, MD;  Location: [facility];  Service: Endoscopy;  Laterality: N/A;   LAPAROSCOPY     TONSILLECTOMY     TOTAL KNEE ARTHROPLASTY Left 07/18/2021   Procedure: TOTAL KNEE ARTHROPLASTY;  Surgeon: Paralee Cancel, MD;  Location: [facility];  Service: Orthopedics;  Laterality: Left;   TURBINATE REDUCTION     Patient Active Problem List   Diagnosis Date Noted   Dysuria 01/07/2022   Postmenopausal estrogen deficiency 11/13/2021   Leg length inequality 11/06/2021   Impaired ambulation 10/23/2021   Contact dermatitis 10/16/2021   SI joint arthritis 08/24/2021   Spinal stenosis, lumbar region,  without neurogenic claudication 08/24/2021   Degeneration of lumbar intervertebral disc 07/24/2021   Stiffness of left knee 07/21/2021   S/P total knee arthroplasty, left 07/18/2021   Preop examination 07/07/2021   Right hip pain 07/07/2021   Low back pain 07/07/2021   Drug-induced immunodeficiency ([place]) 06/12/2021   Impingement syndrome of right shoulder region 06/12/2021   Preop cardiovascular exam 06/08/2021   COVID-19 01/24/2021   Hammer toe 04/22/2020   Metatarsalgia of left foot 04/22/2020   Osteoarthritis of midfoot 04/22/2020   Duodenal ulcer 11/12/2019   Stress 11/12/2019   Morbid obesity ([place]) 11/06/2019   Pain in left knee 07/30/2019   Nonrheumatic aortic valve stenosis 04/14/2018   Heart murmur 02/06/2018   Maxillary sinusitis, chronic 10/24/2017   Chronic diarrhea 10/09/2017   Acid reflux 07/15/2017   Asymptomatic carotid artery stenosis 07/15/2017   Cyst of skin 07/15/2017   Immunocompromised ([place]) 11/09/2016   Allergic rhinitis 11/02/2016   Tremor 11/02/2016   History of hypertension 11/02/2016   Postmenopausal symptoms 07/01/2015   Seasonal allergic rhinitis due to pollen 07/01/2015   Fatty liver 09/08/2007   Rheumatoid arthritis ([place]) 09/08/2007    PCP: Leone Haven, MD   REFERRING PROVIDER: Charlett Blake, MD     REFERRING DIAG: [phone] (ICD-10-CM) - Right hip pain   Rationale for Evaluation and Treatment: Rehabilitation  THERAPY DIAG:  Pain in right hip  Other low back pain  Muscle weakness (generalized)  Other abnormalities of gait and mobility  ONSET DATE: ~ 6 months  SUBJECTIVE:                                                                                                                                                                                           SUBJECTIVE STATEMENT:  Pt sorry for running late.  Reports good response from last visit"    PAIN:  Are you having pain? Yes: NPRS scale: currently 6/10  worst  8/10 Pain location: Right SI through posterior and anterior hip Pain description: sharp quality to ache Aggravating factors: weight bearing Relieving factors: Oral meds,lidocaine patches, TENS sitting, lying down  PRECAUTIONS: Fall  WEIGHT BEARING RESTRICTIONS: No  FALLS:  Has patient fallen in last 6 months? No  LIVING ENVIRONMENT: Lives with: lives alone Lives in: House/apartment Stairs:  2 steps into sunk in living room to kitchen and bathroom.  Has a hand rail Has following equipment at home: Gilford Rile - 2 wheeled  OCCUPATION: retired Pharmacist, hospital  PLOF: Independent with household mobility with device  PATIENT GOALS: walk better, decrease pain, be able to transfer (car, into bed) better, get around home safer  NEXT MD VISIT: Feb Dr Letta Pate  OBJECTIVE:   DIAGNOSTIC FINDINGS:   IMPRESSION: x-ray 02/24/22 1. Osteolysis of the right femoral head which has significantly progressed compared with 06/16/2019 and severe right hip joint space narrowing as can be seen with rapidly progressive osteoarthritis. Septic arthritis is considered much less likely.   MRI lumbar spine 10/23/21 IMPRESSION: 1. Widespread lumbar spine degeneration with no acute osseous abnormality. Mild scoliosis and multilevel spondylolisthesis   2. Mild to moderate multifactorial spinal stenosis at L2-L3. Mild left lateral recess stenosis at L1-L2 and L4-L5. Up to moderate neural foraminal stenosis at the left L2, right L3, left L4 and left L5 nerve levels.   3. Partially visible distended gallbladder. Query right upper quadrant pain.  PATIENT SURVEYS:  FOTO 26% with goal of 51 at visit 15   COGNITION: Overall cognitive status: Within functional limits for tasks assessed     SENSATION: WFL  MUSCLE LENGTH: Hamstrings: wfl   POSTURE: rounded shoulders, weight shift left, and right hip slightly higher than left  PALPATION: Right hip At gr trochanter TTP  LUMBAR ROM:   Limited with side  bending Increased hip pain  LOWER EXTREMITY ROM:     Active  Right eval Left eval  Hip flexion 40d active 90 passive 110 active  Hip extension    Hip abduction    Hip adduction    Hip internal rotation 20  Hip external rotation 30   Knee flexion 110 120  Knee extension 0 0 stiff  Ankle dorsiflexion wfl wfl  Ankle plantarflexion    Ankle inversion    Ankle eversion     (Blank rows = not tested)  LOWER EXTREMITY MMT:    MMT Right eval Left eval  Hip flexion 3- 3+  Hip extension    Hip abduction 3- 5  Hip adduction 4 5  Hip internal rotation    Hip external rotation    Knee flexion 5 4+  Knee extension 5 4+  Ankle dorsiflexion    Ankle plantarflexion    Ankle inversion    Ankle eversion     (Blank rows = not tested)  LUMBAR SPECIAL TESTS:  Marcello Moores test: Negative  FUNCTIONAL TESTS:  TBA at later date as tolerated  GAIT: Distance walked: 459f Assistive device utilized: WEnvironmental consultant- 2 wheeled Level of assistance: Modified independence Comments: decreased stance time lle, right leaning, lack of right hip flex just clearing right foot from floor. Cadence slowed and antalgic  TODAY'S TREATMENT:                                                                                                                              Pt seen for aquatic therapy today.  Treatment took place [place] Temp of water was 91.  Pt entered/exited the pool via chair lift.   *Seated on chair lift: cycling; add/abd and SLR alternating x 588ms  *Walking forward, backward and side stepping ue suppport blue   *Standing UE on wall 3.93f45fater, brick under RLE for leg length difference:  hip extension x 20; add/abd x 20; marching in   place; heel raises x 10; small hip circles CW/CCW RLE  * sitting on yellow noodle:  stool scoots forward/ backward   * walking forward/ backward holding barbell   * straddling noodle:  cycling, hip abdct/addct, cc ski -  small range   Pt requires the buoyancy and hydrostatic pressure of water for support, and to offload joints by unweighting joint load by at least 50 % in navel deep water and by at least 75-80% in chest to neck deep water.  Viscosity of the water is needed for resistance of strengthening. Water current perturbations provides challenge to standing balance requiring increased core activation.    PATIENT EDUCATION:  Education details: aquatics modifications/ progressions  Person educated: Patient Education method: Explanation Education comprehension: verbalized understanding and returned demonstration  HOME EXERCISE PROGRAM: Access Code: [email or web address]: [email or web address] Date: 02/15/2022 Prepared by: FraDenton Meekxercises - Supine Posterior Pelvic Tilt  - 1 x daily - 7 x weekly - 3 sets - 10 reps - Supine Single Leg Ankle Pumps  - 1 x daily - 7 x weekly - 3 sets - 10 reps - Supine Heel Slide  - 1 x daily - 7 x weekly - 3 sets -  10 reps - Supine Hip Abduction  - 1 x daily - 7 x weekly - 3 sets - 10 reps - Active Straight Leg Raise with Quad Set  - 1 x daily - 7 x weekly - 3 sets - 10 reps  ASSESSMENT:  CLINICAL IMPRESSION: Pt transferring without difficulty wc to and from lift. She tolerates progression of exercises well with added reps.  Session a little short due to Pt arriving late. Goals ongoing      Initial assessment Patient is a 72 y.o. F who was seen today for physical therapy evaluation and treatment for R hip pain.   She reports she has lost 65 lb since Last January needed to have left TKR.  She presents today using FWW and has a lifted right shoe.  Pt reports SI and LBP as well as right hip pain.  Leg length difference since TKR but I am suspicious she has had an advanced right hip OA which may have caused the shortening.  She does report a slight lumbar scoliosis but does not appear to be significant. Right hip strength deficits along with IR and ER  limitations in range.  Right SI pain improved when lift was added to shoe. She has an order for an xray right hip. She will benefit from skilled PT physical therapy beginning with aquatic intervention progressing to land based when appropriate.  She will progress with improved tolerance using the properties of water to improve strength, ROM, decrease pain, improve balance/gait translating to improvement in ADL's and functional mobility.  OBJECTIVE IMPAIRMENTS: Abnormal gait, decreased activity tolerance, decreased balance, decreased endurance, decreased mobility, difficulty walking, decreased ROM, decreased strength, decreased safety awareness, increased muscle spasms, impaired flexibility, improper body mechanics, postural dysfunction, obesity, and pain.   ACTIVITY LIMITATIONS: carrying, lifting, bending, sitting, standing, squatting, sleeping, stairs, transfers, bed mobility, and locomotion level  PARTICIPATION LIMITATIONS: meal prep, cleaning, laundry, driving, shopping, community activity, occupation, and yard work  PERSONAL FACTORS: Age, Fitness, Past/current experiences, Time since onset of injury/illness/exacerbation, and 1-2 comorbidities: obesity, leg length difference  are also affecting patient's functional outcome.   REHAB POTENTIAL: Good  CLINICAL DECISION MAKING: Evolving/moderate complexity  EVALUATION COMPLEXITY: Moderate   GOALS: Goals reviewed with patient? Yes  SHORT TERM GOALS: Target date: 03/22/22  Pt will tolerate aquatic therapy sessions without increase in pain to demonstrate positive response to intervention Baseline:TBA Goal status: INITIAL  2.  Pt will tolerate driving without increase in pain Baseline: barely tolerable Goal status: INITIAL  3.  Pt will be able to move right foot on and off of car pedals with ease. Baseline: much difficulty and pain Goal status: INITIAL  4.  Pt will improve rle hip flex and abd strength to 3+/5 or > to demonstrate the safe  ability to leverage RLE with all transfers with improved safety. Baseline: 3- Goal status: INITIAL  5.  Pt to be indep and safe with putting fww into car then getting into drivers side Baseline: "worse thing I do very unsteady" Goal status: INITIAL  6.  Pt will use stairs into and out of pool to improve strength with completion improving safety with stairs at home. Baseline:  Goal status: INITIAL  LONG TERM GOALS: Target date: 04/26/22  Foto goal of 51 will be met to demonstrate pt improved perception of her functional mobility Baseline: 26% Goal status: INITIAL  2.  Pt to decrease max pain to <or=4/10 to demonstrate improvement in management of condition. Baseline: 8/10 Goal status: INITIAL  3.  Pt will be able to lift her rle into bed without UE assistance demonstrating an improvement in strength Baseline: UE lifts rle into bed Goal status: INITIAL  4.  Pt will be indep with final HEP (land and/or aquatic as indicated) for long term management of condition Baseline: basic Sup exercises Goal status: INITIAL  5.  Pt will improve strength in all weak areas up at least 1 full grade to improve functional mobility and ADL's Baseline: see chart   PLAN:  PT FREQUENCY: 1-2x/week  PT DURATION: 10 weeks  PLANNED INTERVENTIONS: Therapeutic exercises, Therapeutic activity, Neuromuscular re-education, Balance training, Gait training, Patient/Family education, Self Care, Joint mobilization, Joint manipulation, Stair training, Orthotic/Fit training, DME instructions, Aquatic Therapy, Dry Needling, Electrical stimulation, Cryotherapy, Moist heat, scar mobilization, Splintting, Taping, Ultrasound, Parrafin, Biofeedback, Ionotophoresis '4mg'$ /ml Dexamethasone, Manual therapy, and Re-evaluation.  PLAN FOR NEXT SESSION: Aquatics for strengthening LE/core; gait and balance retraining; stair climbing, aerobic capacity improvement; Pt edu/training, sup <>sit  Annamarie Major) Jonuel Butterfield MPT 03/15/22  6:00 PM [facility] [place], [place], [address] Phone: [phone]   Fax:  [phone]  Referring diagnosis? R hip pain Treatment diagnosis? (if different than referring diagnosis) right hip pain What was this (referring dx) caused by? '[]'$  Surgery '[]'$  Fall '[x]'$  Ongoing issue '[x]'$  Arthritis '[]'$  Other: ____________  Laterality: '[]'$  Rt '[]'$  Lt '[x]'$  Both  Check all possible CPT codes:  *CHOOSE 10 OR LESS*    '[x]'$  97110 (Therapeutic Exercise)  '[]'$  92507 (SLP Treatment)  '[]'$  97112 (Neuro Re-ed)   '[]'$  92526 (Swallowing Treatment)   '[x]'$  97116 (Gait Training)   '[]'$  [id number] (Cognitive Training, 1st 15 minutes) '[x]'$  97140 (Manual Therapy)   '[]'$  97130 (Cognitive Training, each add'l 15 minutes)  '[x]'$  97164 (Re-evaluation)                              '[]'$  Other, List CPT Code ____________  '[x]'$  98338 (Therapeutic Activities)     '[x]'$  97535 (Self Care)   '[]'$  All codes above (97110 - 97535)  '[]'$  97012 (Mechanical Traction)  '[x]'$  97014 (E-stim Unattended)  '[x]'$  97032 (E-stim manual)  '[x]'$  97033 (Ionto)  '[x]'$  97035 (Ultrasound) '[x]'$  97750 (Physical Performance Training) '[x]'$  [id number] (Aquatic Therapy) '[]'$  97016 (Vasopneumatic Device) '[]'$  [id number] (Paraffin) '[]'$  97034 (Contrast Bath) '[]'$  97597 (Wound Care 1st 20 sq cm) '[]'$  97598 (Wound Care each add'l 20 sq cm) '[x]'$  97760 (Orthotic Fabrication, Fitting, Training Initial) '[]'$  [id number] (Prosthetic Management and Training Initial) '[]'$  [id number] (Orthotic or Prosthetic Training/ Modification Subsequent)

## 2022-03-16 ENCOUNTER — Ambulatory Visit: Payer: Medicare PPO | Admitting: Physical Medicine & Rehabilitation

## 2022-03-20 ENCOUNTER — Ambulatory Visit (HOSPITAL_BASED_OUTPATIENT_CLINIC_OR_DEPARTMENT_OTHER): Payer: Medicare PPO | Admitting: Physical Therapy

## 2022-03-20 ENCOUNTER — Ambulatory Visit (INDEPENDENT_AMBULATORY_CARE_PROVIDER_SITE_OTHER): Payer: Medicare PPO | Admitting: Orthopaedic Surgery

## 2022-03-20 ENCOUNTER — Encounter (HOSPITAL_BASED_OUTPATIENT_CLINIC_OR_DEPARTMENT_OTHER): Payer: Self-pay | Admitting: Physical Therapy

## 2022-03-20 VITALS — Ht 64.5 in | Wt 191.0 lb

## 2022-03-20 DIAGNOSIS — M6281 Muscle weakness (generalized): Secondary | ICD-10-CM | POA: Diagnosis not present

## 2022-03-20 DIAGNOSIS — R2689 Other abnormalities of gait and mobility: Secondary | ICD-10-CM | POA: Diagnosis not present

## 2022-03-20 DIAGNOSIS — M25551 Pain in right hip: Secondary | ICD-10-CM | POA: Diagnosis not present

## 2022-03-20 DIAGNOSIS — M5459 Other low back pain: Secondary | ICD-10-CM | POA: Diagnosis not present

## 2022-03-20 DIAGNOSIS — M1611 Unilateral primary osteoarthritis, right hip: Secondary | ICD-10-CM

## 2022-03-20 NOTE — Progress Notes (Signed)
The patient is a 72 year old sent from Dr. Thea Silversmith to evaluate and treat debilitating arthritis involving her right hip.  Over a short amount of time this hip pain has gotten significantly worse and she has had injections but now is not helping.  There is a significant leg length discrepancy where she has a wear a shoe buildup on her right side due to her leg length being shorter on the right side.  Her pain is the groin and is pretty severe.  She has a history of a knee replacement.  This is gotten significantly worse over the last 6 months and is debilitating.  I was able to review all of her notes within epic and look at her x-rays and share those with her today.  Her pain is 10 out of 10.  It is in the groin.  It is detrimentally affecting her mobility, her quality of life and her actives daily living.  She is on rheumatologic medications but she is not on blood thinning medications.  She is not a diabetic.  She currently denies any fever, chills, nausea, vomiting.  On exam her left hip moves smoothly and fluidly.  The right hip has significant limitations in motion.  I did lay her in the supine position and we are able to get to the hip through the front.  She has a significant leg length difference with the right side shorter than left.  Her x-rays show complete loss of joint space in the femoral head is basically "melting away".  The left hip appears normal.  I was able to show her the differences.  We had a long and thorough discussion about hip replacement surgery.  I discussed in detail the risks and benefits of the surgery and what to expect from an intraoperative and postoperative course.  All questions and concerns were answered and addressed.  Will work on getting her on the schedule.  I did give her handout about hip replacement surgery as well and went over a hip replacement model in detail.  She will certainly need skilled nursing short-term after the surgery given that she lives  alone.

## 2022-03-20 NOTE — Therapy (Signed)
OUTPATIENT PHYSICAL THERAPY THORACOLUMBAR TREATMENT   Patient Name: Wanda Lang MRN: [id number] DOB:1950/06/01, 72 y.o., female Today's Date: 03/20/2022  END OF SESSION:  PT End of Session - 03/20/22 1355     Visit Number 6    Number of Visits 20    Date for PT Re-Evaluation 04/26/22    Authorization Type human mcr    Progress Note Due on Visit 10    PT Start Time 1400    PT Stop Time 1445    PT Time Calculation (min) 45 min    Activity Tolerance Patient tolerated treatment well    Behavior During Therapy WFL for tasks assessed/performed             Past Medical History:  Diagnosis Date   Allergy    Arthritis    Chronic kidney disease    Coronary artery disease    GERD (gastroesophageal reflux disease)    Heart murmur    Hypertension    LIVER FUNCTION TESTS, ABNORMAL, HX OF 09/08/2007   Qualifier: Diagnosis of  By: Harlon Ditty CMA (AAMA), Dottie     Past Surgical History:  Procedure Laterality Date   ABDOMINAL HYSTERECTOMY     COLONOSCOPY WITH PROPOFOL N/A 09/04/2018   Procedure: COLONOSCOPY WITH PROPOFOL;  Surgeon: Virgel Manifold, MD;  Location: [facility];  Service: Endoscopy;  Laterality: N/A;   ESOPHAGOGASTRODUODENOSCOPY (EGD) WITH PROPOFOL N/A 09/04/2018   Procedure: ESOPHAGOGASTRODUODENOSCOPY (EGD) WITH PROPOFOL;  Surgeon: Virgel Manifold, MD;  Location: [facility];  Service: Endoscopy;  Laterality: N/A;   LAPAROSCOPY     TONSILLECTOMY     TOTAL KNEE ARTHROPLASTY Left 07/18/2021   Procedure: TOTAL KNEE ARTHROPLASTY;  Surgeon: Paralee Cancel, MD;  Location: [facility];  Service: Orthopedics;  Laterality: Left;   TURBINATE REDUCTION     Patient Active Problem List   Diagnosis Date Noted   Unilateral primary osteoarthritis, right hip 03/20/2022   Dysuria 01/07/2022   Postmenopausal estrogen deficiency 11/13/2021   Leg length inequality 11/06/2021   Impaired ambulation 10/23/2021   Contact dermatitis 10/16/2021   SI joint arthritis  08/24/2021   Spinal stenosis, lumbar region, without neurogenic claudication 08/24/2021   Degeneration of lumbar intervertebral disc 07/24/2021   Stiffness of left knee 07/21/2021   S/P total knee arthroplasty, left 07/18/2021   Preop examination 07/07/2021   Right hip pain 07/07/2021   Low back pain 07/07/2021   Drug-induced immunodeficiency ([place]) 06/12/2021   Impingement syndrome of right shoulder region 06/12/2021   Preop cardiovascular exam 06/08/2021   COVID-19 01/24/2021   Hammer toe 04/22/2020   Metatarsalgia of left foot 04/22/2020   Osteoarthritis of midfoot 04/22/2020   Duodenal ulcer 11/12/2019   Stress 11/12/2019   Morbid obesity ([place]) 11/06/2019   Pain in left knee 07/30/2019   Nonrheumatic aortic valve stenosis 04/14/2018   Heart murmur 02/06/2018   Maxillary sinusitis, chronic 10/24/2017   Chronic diarrhea 10/09/2017   Acid reflux 07/15/2017   Asymptomatic carotid artery stenosis 07/15/2017   Cyst of skin 07/15/2017   Immunocompromised ([place]) 11/09/2016   Allergic rhinitis 11/02/2016   Tremor 11/02/2016   History of hypertension 11/02/2016   Postmenopausal symptoms 07/01/2015   Seasonal allergic rhinitis due to pollen 07/01/2015   Fatty liver 09/08/2007   Rheumatoid arthritis ([place]) 09/08/2007    PCP: Leone Haven, MD   REFERRING PROVIDER: Charlett Blake, MD     REFERRING DIAG: [id number] (ICD-10-CM) - Right hip pain   Rationale for Evaluation and Treatment: Rehabilitation  THERAPY DIAG:  Pain in right hip  Other low back pain  Muscle weakness (generalized)  Other abnormalities of gait and mobility  ONSET DATE: ~ 6 months  SUBJECTIVE:                                                                                                                                                                                           SUBJECTIVE STATEMENT:  "Dr told me that the head of my femur has completely desinegrated which is why I am having pain  and a leg length difference.   PAIN:  Are you having pain? Yes: NPRS scale: currently 6/10  worst 8/10 Pain location: Right SI through posterior and anterior hip Pain description: sharp quality to ache Aggravating factors: weight bearing Relieving factors: Oral meds,lidocaine patches, TENS sitting, lying down  PRECAUTIONS: Fall  WEIGHT BEARING RESTRICTIONS: No  FALLS:  Has patient fallen in last 6 months? No  LIVING ENVIRONMENT: Lives with: lives alone Lives in: House/apartment Stairs:  2 steps into sunk in living room to kitchen and bathroom.  Has a hand rail Has following equipment at home: Gilford Rile - 2 wheeled  OCCUPATION: retired Pharmacist, hospital  PLOF: Independent with household mobility with device  PATIENT GOALS: walk better, decrease pain, be able to transfer (car, into bed) better, get around home safer  NEXT MD VISIT: Feb Dr Letta Pate  OBJECTIVE:   DIAGNOSTIC FINDINGS:   IMPRESSION: x-ray 02/24/22 1. Osteolysis of the right femoral head which has significantly progressed compared with 06/16/2019 and severe right hip joint space narrowing as can be seen with rapidly progressive osteoarthritis. Septic arthritis is considered much less likely.   MRI lumbar spine 10/23/21 IMPRESSION: 1. Widespread lumbar spine degeneration with no acute osseous abnormality. Mild scoliosis and multilevel spondylolisthesis   2. Mild to moderate multifactorial spinal stenosis at L2-L3. Mild left lateral recess stenosis at L1-L2 and L4-L5. Up to moderate neural foraminal stenosis at the left L2, right L3, left L4 and left L5 nerve levels.   3. Partially visible distended gallbladder. Query right upper quadrant pain.  PATIENT SURVEYS:  FOTO 26% with goal of 51 at visit 15 03/20/22 21%   COGNITION: Overall cognitive status: Within functional limits for tasks assessed     SENSATION: WFL  MUSCLE LENGTH: Hamstrings: wfl   POSTURE: rounded shoulders, weight shift left, and right hip  slightly higher than left  PALPATION: Right hip At gr trochanter TTP  LUMBAR ROM:   Limited with side bending Increased hip pain  LOWER EXTREMITY ROM:     Active  Right eval Left eval  Hip flexion 40d active 90 passive 110 active  Hip extension  Hip abduction    Hip adduction    Hip internal rotation 20   Hip external rotation 30   Knee flexion 110 120  Knee extension 0 0 stiff  Ankle dorsiflexion wfl wfl  Ankle plantarflexion    Ankle inversion    Ankle eversion     (Blank rows = not tested)  LOWER EXTREMITY MMT:    MMT Right eval Left eval  Hip flexion 3- 3+  Hip extension    Hip abduction 3- 5  Hip adduction 4 5  Hip internal rotation    Hip external rotation    Knee flexion 5 4+  Knee extension 5 4+  Ankle dorsiflexion    Ankle plantarflexion    Ankle inversion    Ankle eversion     (Blank rows = not tested)  LUMBAR SPECIAL TESTS:  Marcello Moores test: Negative  FUNCTIONAL TESTS:  TBA at later date as tolerated  GAIT: Distance walked: 450f Assistive device utilized: WEnvironmental consultant- 2 wheeled Level of assistance: Modified independence Comments: decreased stance time lle, right leaning, lack of right hip flex just clearing right foot from floor. Cadence slowed and antalgic  TODAY'S TREATMENT:                                                                                                                              Pt seen for aquatic therapy today.  Treatment took place [place] [facility] Temp of water was 91.  Pt entered/exited the pool via chair lift.   *Seated on chair lift: cycling; add/abd, SLR and LAQ alternating x 567ms  *Walking forward, backward and side stepping supported by straddling yellow noodle.  * straddling noodle:  cycling, hip abdct/addct, cc ski - small range   *Standing UE on wall 3.5f47fater, brick under RLE for leg length difference:  hip extension x 20; add/abd x 20; marching in   place; heel  raises x 10; small hip circles CW/CCW RLE x15  * Noodle pull down wide stance then feet staggered full small noodle 1x10 ea position  *forward and backward amb ue support on yellow noodle    Pt requires the buoyancy and hydrostatic pressure of water for support, and to offload joints by unweighting joint load by at least 50 % in navel deep water and by at least 75-80% in chest to neck deep water.  Viscosity of the water is needed for resistance of strengthening. Water current perturbations provides challenge to standing balance requiring increased core activation.    PATIENT EDUCATION:  Education details: aquatics modifications/ progressions  Person educated: Patient Education method: Explanation Education comprehension: verbalized understanding and returned demonstration  HOME EXERCISE PROGRAM: Access Code: [email or web address]: [email or web address] Date: 02/15/2022 Prepared by: FraDenton Meekxercises - Supine Posterior Pelvic Tilt  - 1 x daily - 7 x weekly - 3 sets - 10 reps - Supine Single Leg Ankle Pumps  - 1 x  daily - 7 x weekly - 3 sets - 10 reps - Supine Heel Slide  - 1 x daily - 7 x weekly - 3 sets - 10 reps - Supine Hip Abduction  - 1 x daily - 7 x weekly - 3 sets - 10 reps - Active Straight Leg Raise with Quad Set  - 1 x daily - 7 x weekly - 3 sets - 10 reps  ASSESSMENT:  CLINICAL IMPRESSION: Pt saw Dr Rush Farmer yesterday.  She is a candidate for a THR due to the osteolysis of her left femoral head.  She has a second opinion scheduled tomorrow in [place] at [facility].  Foto completed today which suggests a decrease in her perceived function which is probably due to her MD appointment yesterday.  She is able to complete core engagement exercises using brick to even le's for execution well. Goals addressed.  She reports decreased pain in left hip with aquatic sessions consistently.  Her ability to get in and out of car and manage fww is some easier as she  reports she has to do it daily.  Pressing gas and break better as she has found the  "sweet spot" and feels left leg is getting stronger. We have not yet initiated stair climbing into/out of pool. She has met a few STG's Pain low today but she re[ports ever present.     Initial assessment Patient is a 72 y.o. F who was seen today for physical therapy evaluation and treatment for R hip pain.   She reports she has lost 65 lb since Last January needed to have left TKR.  She presents today using FWW and has a lifted right shoe.  Pt reports SI and LBP as well as right hip pain.  Leg length difference since TKR but I am suspicious she has had an advanced right hip OA which may have caused the shortening.  She does report a slight lumbar scoliosis but does not appear to be significant. Right hip strength deficits along with IR and ER limitations in range.  Right SI pain improved when lift was added to shoe. She has an order for an xray right hip. She will benefit from skilled PT physical therapy beginning with aquatic intervention progressing to land based when appropriate.  She will progress with improved tolerance using the properties of water to improve strength, ROM, decrease pain, improve balance/gait translating to improvement in ADL's and functional mobility.  OBJECTIVE IMPAIRMENTS: Abnormal gait, decreased activity tolerance, decreased balance, decreased endurance, decreased mobility, difficulty walking, decreased ROM, decreased strength, decreased safety awareness, increased muscle spasms, impaired flexibility, improper body mechanics, postural dysfunction, obesity, and pain.   ACTIVITY LIMITATIONS: carrying, lifting, bending, sitting, standing, squatting, sleeping, stairs, transfers, bed mobility, and locomotion level  PARTICIPATION LIMITATIONS: meal prep, cleaning, laundry, driving, shopping, community activity, occupation, and yard work  PERSONAL FACTORS: Age, Fitness, Past/current experiences,  Time since onset of injury/illness/exacerbation, and 1-2 comorbidities: obesity, leg length difference  are also affecting patient's functional outcome.   REHAB POTENTIAL: Good  CLINICAL DECISION MAKING: Evolving/moderate complexity  EVALUATION COMPLEXITY: Moderate   GOALS: Goals reviewed with patient? Yes  SHORT TERM GOALS: Target date: 03/22/22  Pt will tolerate aquatic therapy sessions without increase in pain to demonstrate positive response to intervention Baseline:TBA Goal status: Met 03/20/22  2.  Pt will tolerate driving without increase in pain Baseline: barely tolerable Goal status: Met 03/20/22  3.  Pt will be able to move right foot on and off of  car pedals with ease. Baseline: much difficulty and pain Goal status: Met 03/20/22  4.  Pt will improve rle hip flex and abd strength to 3+/5 or > to demonstrate the safe ability to leverage RLE with all transfers with improved safety. Baseline: 3- Goal status: INITIAL  5.  Pt to be indep and safe with putting fww into car then getting into drivers side Baseline: "worse thing I do very unsteady" Goal status: ongoing 03/20/22  6.  Pt will use stairs into and out of pool to improve strength with completion improving safety with stairs at home. Baseline:  Goal status: ongoing 03/20/22  LONG TERM GOALS: Target date: 04/26/22  Foto goal of 51 will be met to demonstrate pt improved perception of her functional mobility Baseline: 26% Goal status: INITIAL  2.  Pt to decrease max pain to <or=4/10 to demonstrate improvement in management of condition. Baseline: 8/10 Goal status: INITIAL  3.  Pt will be able to lift her rle into bed without UE assistance demonstrating an improvement in strength Baseline: UE lifts rle into bed Goal status: INITIAL  4.  Pt will be indep with final HEP (land and/or aquatic as indicated) for long term management of condition Baseline: basic Sup exercises Goal status: INITIAL  5.  Pt will improve  strength in all weak areas up at least 1 full grade to improve functional mobility and ADL's Baseline: see chart   PLAN:  PT FREQUENCY: 1-2x/week  PT DURATION: 10 weeks  PLANNED INTERVENTIONS: Therapeutic exercises, Therapeutic activity, Neuromuscular re-education, Balance training, Gait training, Patient/Family education, Self Care, Joint mobilization, Joint manipulation, Stair training, Orthotic/Fit training, DME instructions, Aquatic Therapy, Dry Needling, Electrical stimulation, Cryotherapy, Moist heat, scar mobilization, Splintting, Taping, Ultrasound, Parrafin, Biofeedback, Ionotophoresis 75m/ml Dexamethasone, Manual therapy, and Re-evaluation.  PLAN FOR NEXT SESSION: Aquatics for strengthening LE/core; gait and balance retraining; stair climbing, aerobic capacity improvement; Pt edu/training, sup <>sit  MAnnamarie Major Lucresia Simic MPT 03/20/22 2:19 PM [facility] [address] [address]Phone: [phone]  Fax:  [phone] Referring diagnosis? R hip pain Treatment diagnosis? (if different than referring diagnosis) right hip pain What was this (referring dx) caused by? []$  Surgery []$  Fall [x]$  Ongoing issue [x]$  Arthritis []$  Other: ____________  Laterality: []$  Rt []$  Lt [x]$  Both  Check all possible CPT codes:  *CHOOSE 10 OR LESS*    [x]$  97110 (Therapeutic Exercise)  []$  92507 (SLP Treatment)  []$  [id number](Neuro Re-ed)   []$  92526 (Swallowing Treatment)   [x]$  97116 (Gait Training)   []$  [id number](Cognitive Training, 1st 15 minutes) [x]$  97140 (Manual Therapy)   []$  97130 (Cognitive Training, each add'l 15 minutes)  [x]$  97164 (Re-evaluation)                              []$  Other, List CPT Code ____________  [x]$  97530 (Therapeutic Activities)     [x]$  97535 (Self Care)   []$  All codes above (97110 - 97535)  []$  97012 (Mechanical Traction)  [x]$  97014 (E-stim Unattended)  [x]$  97032 (E-stim manual)  [x]$  97033  (Ionto)  [x]$  97035 (Ultrasound) [x]$  97750 (Physical Performance Training) [x]$  [id number](Aquatic Therapy) []$  97016 (Vasopneumatic Device) []$  [id number](Paraffin) []$  97034 (Contrast Bath) []$  97597 (Wound Care 1st 20 sq cm) []$  97598 (Wound Care each add'l 20 sq cm) [x]$  97760 (Orthotic Fabrication, Fitting, Training Initial) []$  [id number](Prosthetic Management and Training Initial) []$  [id number](Orthotic  or Prosthetic Training/ Modification Subsequent)

## 2022-03-21 DIAGNOSIS — M87051 Idiopathic aseptic necrosis of right femur: Secondary | ICD-10-CM | POA: Diagnosis not present

## 2022-03-22 ENCOUNTER — Encounter (HOSPITAL_BASED_OUTPATIENT_CLINIC_OR_DEPARTMENT_OTHER): Payer: Self-pay | Admitting: Physical Therapy

## 2022-03-22 ENCOUNTER — Ambulatory Visit (HOSPITAL_BASED_OUTPATIENT_CLINIC_OR_DEPARTMENT_OTHER): Payer: Medicare PPO | Admitting: Physical Therapy

## 2022-03-22 DIAGNOSIS — M5459 Other low back pain: Secondary | ICD-10-CM

## 2022-03-22 DIAGNOSIS — M6281 Muscle weakness (generalized): Secondary | ICD-10-CM

## 2022-03-22 DIAGNOSIS — R2689 Other abnormalities of gait and mobility: Secondary | ICD-10-CM | POA: Diagnosis not present

## 2022-03-22 DIAGNOSIS — M25551 Pain in right hip: Secondary | ICD-10-CM

## 2022-03-22 NOTE — Therapy (Signed)
OUTPATIENT PHYSICAL THERAPY THORACOLUMBAR TREATMENT   Patient Name: Wanda Lang MRN: [id number] DOB:1951/01/15, 72 y.o., female Today's Date: 03/22/2022  END OF SESSION:  PT End of Session - 03/22/22 1409     Visit Number 7    Number of Visits 20    Date for PT Re-Evaluation 04/26/22    Authorization Type human mcr    Progress Note Due on Visit 10    PT Start Time 1401    PT Stop Time 1445    PT Time Calculation (min) 44 min    Activity Tolerance Patient tolerated treatment well    Behavior During Therapy WFL for tasks assessed/performed             Past Medical History:  Diagnosis Date   Allergy    Arthritis    Chronic kidney disease    Coronary artery disease    GERD (gastroesophageal reflux disease)    Heart murmur    Hypertension    LIVER FUNCTION TESTS, ABNORMAL, HX OF 09/08/2007   Qualifier: Diagnosis of  By: Harlon Ditty CMA (AAMA), Dottie     Past Surgical History:  Procedure Laterality Date   ABDOMINAL HYSTERECTOMY     COLONOSCOPY WITH PROPOFOL N/A 09/04/2018   Procedure: COLONOSCOPY WITH PROPOFOL;  Surgeon: Virgel Manifold, MD;  Location: [facility];  Service: Endoscopy;  Laterality: N/A;   ESOPHAGOGASTRODUODENOSCOPY (EGD) WITH PROPOFOL N/A 09/04/2018   Procedure: ESOPHAGOGASTRODUODENOSCOPY (EGD) WITH PROPOFOL;  Surgeon: Virgel Manifold, MD;  Location: [facility];  Service: Endoscopy;  Laterality: N/A;   LAPAROSCOPY     TONSILLECTOMY     TOTAL KNEE ARTHROPLASTY Left 07/18/2021   Procedure: TOTAL KNEE ARTHROPLASTY;  Surgeon: Paralee Cancel, MD;  Location: [facility];  Service: Orthopedics;  Laterality: Left;   TURBINATE REDUCTION     Patient Active Problem List   Diagnosis Date Noted   Unilateral primary osteoarthritis, right hip 03/20/2022   Dysuria 01/07/2022   Postmenopausal estrogen deficiency 11/13/2021   Leg length inequality 11/06/2021   Impaired ambulation 10/23/2021   Contact dermatitis 10/16/2021   SI joint arthritis  08/24/2021   Spinal stenosis, lumbar region, without neurogenic claudication 08/24/2021   Degeneration of lumbar intervertebral disc 07/24/2021   Stiffness of left knee 07/21/2021   S/P total knee arthroplasty, left 07/18/2021   Preop examination 07/07/2021   Right hip pain 07/07/2021   Low back pain 07/07/2021   Drug-induced immunodeficiency ([place]) 06/12/2021   Impingement syndrome of right shoulder region 06/12/2021   Preop cardiovascular exam 06/08/2021   COVID-19 01/24/2021   Hammer toe 04/22/2020   Metatarsalgia of left foot 04/22/2020   Osteoarthritis of midfoot 04/22/2020   Duodenal ulcer 11/12/2019   Stress 11/12/2019   Morbid obesity ([place]) 11/06/2019   Pain in left knee 07/30/2019   Nonrheumatic aortic valve stenosis 04/14/2018   Heart murmur 02/06/2018   Maxillary sinusitis, chronic 10/24/2017   Chronic diarrhea 10/09/2017   Acid reflux 07/15/2017   Asymptomatic carotid artery stenosis 07/15/2017   Cyst of skin 07/15/2017   Immunocompromised ([place]) 11/09/2016   Allergic rhinitis 11/02/2016   Tremor 11/02/2016   History of hypertension 11/02/2016   Postmenopausal symptoms 07/01/2015   Seasonal allergic rhinitis due to pollen 07/01/2015   Fatty liver 09/08/2007   Rheumatoid arthritis ([place]) 09/08/2007    PCP: Leone Haven, MD   REFERRING PROVIDER: Charlett Blake, MD     REFERRING DIAG: [phone] (ICD-10-CM) - Right hip pain   Rationale for Evaluation and Treatment: Rehabilitation  THERAPY DIAG:  Pain in right hip  Other low back pain  Muscle weakness (generalized)  Other abnormalities of gait and mobility  ONSET DATE: ~ 6 months  SUBJECTIVE:                                                                                                                                                                                           SUBJECTIVE STATEMENT:  "I think I will have dr Rush Farmer due to replacement"   PAIN:  Are you having pain? Yes: NPRS  scale: currently 6-7/10  worst 8/10 Pain location: Right SI through posterior and anterior hip Pain description: sharp quality to ache Aggravating factors: weight bearing Relieving factors: Oral meds,lidocaine patches, TENS sitting, lying down  PRECAUTIONS: Fall  WEIGHT BEARING RESTRICTIONS: No  FALLS:  Has patient fallen in last 6 months? No  LIVING ENVIRONMENT: Lives with: lives alone Lives in: House/apartment Stairs:  2 steps into sunk in living room to kitchen and bathroom.  Has a hand rail Has following equipment at home: Gilford Rile - 2 wheeled  OCCUPATION: retired Pharmacist, hospital  PLOF: Independent with household mobility with device  PATIENT GOALS: walk better, decrease pain, be able to transfer (car, into bed) better, get around home safer  NEXT MD VISIT: Feb Dr Letta Pate  OBJECTIVE:   DIAGNOSTIC FINDINGS:   IMPRESSION: x-ray 02/24/22 1. Osteolysis of the right femoral head which has significantly progressed compared with 06/16/2019 and severe right hip joint space narrowing as can be seen with rapidly progressive osteoarthritis. Septic arthritis is considered much less likely.   MRI lumbar spine 10/23/21 IMPRESSION: 1. Widespread lumbar spine degeneration with no acute osseous abnormality. Mild scoliosis and multilevel spondylolisthesis   2. Mild to moderate multifactorial spinal stenosis at L2-L3. Mild left lateral recess stenosis at L1-L2 and L4-L5. Up to moderate neural foraminal stenosis at the left L2, right L3, left L4 and left L5 nerve levels.   3. Partially visible distended gallbladder. Query right upper quadrant pain.  PATIENT SURVEYS:  FOTO 26% with goal of 51 at visit 15 03/20/22 21%   COGNITION: Overall cognitive status: Within functional limits for tasks assessed     SENSATION: WFL  MUSCLE LENGTH: Hamstrings: wfl   POSTURE: rounded shoulders, weight shift left, and right hip slightly higher than left  PALPATION: Right hip At gr trochanter  TTP  LUMBAR ROM:   Limited with side bending Increased hip pain  LOWER EXTREMITY ROM:     Active  Right eval Left eval  Hip flexion 40d active 90 passive 110 active  Hip extension    Hip abduction    Hip adduction    Hip  internal rotation 20   Hip external rotation 30   Knee flexion 110 120  Knee extension 0 0 stiff  Ankle dorsiflexion wfl wfl  Ankle plantarflexion    Ankle inversion    Ankle eversion     (Blank rows = not tested)  LOWER EXTREMITY MMT:    MMT Right eval Left eval  Hip flexion 3- 3+  Hip extension    Hip abduction 3- 5  Hip adduction 4 5  Hip internal rotation    Hip external rotation    Knee flexion 5 4+  Knee extension 5 4+  Ankle dorsiflexion    Ankle plantarflexion    Ankle inversion    Ankle eversion     (Blank rows = not tested)  LUMBAR SPECIAL TESTS:  Marcello Moores test: Negative  FUNCTIONAL TESTS:  TBA at later date as tolerated  GAIT: Distance walked: 428f Assistive device utilized: WEnvironmental consultant- 2 wheeled Level of assistance: Modified independence Comments: decreased stance time lle, right leaning, lack of right hip flex just clearing right foot from floor. Cadence slowed and antalgic  TODAY'S TREATMENT:                                                                                                                              Pt seen for aquatic therapy today.  Treatment took place in water 3.5 ft in depth at the [facility] Temp of water was 91.  Pt entered/exited the pool via chair lift.   *Seated on chair lift: cycling; add/abd, SLR and LAQ alternating   *walking forward and back 4.536fue support white barbell multiple widths. Cues for heel strike as tolerated  *side stepping white barbell x 4 widths. Cues for increasing speed as tolerated by right hip for increased resistance    *Standing UE on wall 4.5 ft water,:small hip circles CW/CCW RLE and Left x15    brick under RLE for leg length difference:  hip extension x  20; add/abd x 20; marching in place; heel raises/toe raises x 10;   *Supine suspension using noodles: hip extension/glute and hamstring firing. Vc, demonstration and manual assist for initial execution.   *Stair climbing instruction for exiting pool.  VC for proper technique.  She completes with moderate ue support on hand rails.  Does report some minor discomfort in right hip in stance.   Pt requires the buoyancy and hydrostatic pressure of water for support, and to offload joints by unweighting joint load by at least 50 % in navel deep water and by at least 75-80% in chest to neck deep water.  Viscosity of the water is needed for resistance of strengthening. Water current perturbations provides challenge to standing balance requiring increased core activation.    PATIENT EDUCATION:  Education details: aquatics modifications/ progressions  Person educated: Patient Education method: Explanation Education comprehension: verbalized understanding and returned demonstration  HOME EXERCISE PROGRAM: Access Code: [email or web address]: [email or web address] Date: 02/15/2022 Prepared by: FrDenton Meek  Exercises - Supine Posterior Pelvic Tilt  - 1 x daily - 7 x weekly - 3 sets - 10 reps - Supine Single Leg Ankle Pumps  - 1 x daily - 7 x weekly - 3 sets - 10 reps - Supine Heel Slide  - 1 x daily - 7 x weekly - 3 sets - 10 reps - Supine Hip Abduction  - 1 x daily - 7 x weekly - 3 sets - 10 reps - Active Straight Leg Raise with Quad Set  - 1 x daily - 7 x weekly - 3 sets - 10 reps  ASSESSMENT:  CLINICAL IMPRESSION: Pt reports second opinion concurred with Dr Rush Farmer.  She has decided to have hip replaced by Dr Rush Farmer as soon as it can get scheduled. She has improved toleration to standing on rle (on brick submerged to allow for left LE engagement). Good glute and hamstring engagement in suspended supine with hip extension.  Increased toleration to movement vs in standing. Goals  ongoing      Initial assessment Patient is a 72 y.o. F who was seen today for physical therapy evaluation and treatment for R hip pain.   She reports she has lost 65 lb since Last January needed to have left TKR.  She presents today using FWW and has a lifted right shoe.  Pt reports SI and LBP as well as right hip pain.  Leg length difference since TKR but I am suspicious she has had an advanced right hip OA which may have caused the shortening.  She does report a slight lumbar scoliosis but does not appear to be significant. Right hip strength deficits along with IR and ER limitations in range.  Right SI pain improved when lift was added to shoe. She has an order for an xray right hip. She will benefit from skilled PT physical therapy beginning with aquatic intervention progressing to land based when appropriate.  She will progress with improved tolerance using the properties of water to improve strength, ROM, decrease pain, improve balance/gait translating to improvement in ADL's and functional mobility.  OBJECTIVE IMPAIRMENTS: Abnormal gait, decreased activity tolerance, decreased balance, decreased endurance, decreased mobility, difficulty walking, decreased ROM, decreased strength, decreased safety awareness, increased muscle spasms, impaired flexibility, improper body mechanics, postural dysfunction, obesity, and pain.   ACTIVITY LIMITATIONS: carrying, lifting, bending, sitting, standing, squatting, sleeping, stairs, transfers, bed mobility, and locomotion level  PARTICIPATION LIMITATIONS: meal prep, cleaning, laundry, driving, shopping, community activity, occupation, and yard work  PERSONAL FACTORS: Age, Fitness, Past/current experiences, Time since onset of injury/illness/exacerbation, and 1-2 comorbidities: obesity, leg length difference  are also affecting patient's functional outcome.   REHAB POTENTIAL: Good  CLINICAL DECISION MAKING: Evolving/moderate complexity  EVALUATION  COMPLEXITY: Moderate   GOALS: Goals reviewed with patient? Yes  SHORT TERM GOALS: Target date: 03/22/22  Pt will tolerate aquatic therapy sessions without increase in pain to demonstrate positive response to intervention Baseline:TBA Goal status: Met 03/20/22  2.  Pt will tolerate driving without increase in pain Baseline: barely tolerable Goal status: Met 03/20/22  3.  Pt will be able to move right foot on and off of car pedals with ease. Baseline: much difficulty and pain Goal status: Met 03/20/22  4.  Pt will improve rle hip flex and abd strength to 3+/5 or > to demonstrate the safe ability to leverage RLE with all transfers with improved safety. Baseline: 3- Goal status: INITIAL  5.  Pt to be indep and safe with putting fww into  car then getting into drivers side Baseline: "worse thing I do very unsteady" Goal status: ongoing 03/20/22  6.  Pt will use stairs into and out of pool to improve strength with completion improving safety with stairs at home. Baseline:  Goal status: ongoing 03/20/22  LONG TERM GOALS: Target date: 04/26/22  Foto goal of 51 will be met to demonstrate pt improved perception of her functional mobility Baseline: 26% Goal status: INITIAL  2.  Pt to decrease max pain to <or=4/10 to demonstrate improvement in management of condition. Baseline: 8/10 Goal status: INITIAL  3.  Pt will be able to lift her rle into bed without UE assistance demonstrating an improvement in strength Baseline: UE lifts rle into bed Goal status: INITIAL  4.  Pt will be indep with final HEP (land and/or aquatic as indicated) for long term management of condition Baseline: basic Sup exercises Goal status: INITIAL  5.  Pt will improve strength in all weak areas up at least 1 full grade to improve functional mobility and ADL's Baseline: see chart   PLAN:  PT FREQUENCY: 1-2x/week  PT DURATION: 10 weeks  PLANNED INTERVENTIONS: Therapeutic exercises, Therapeutic activity,  Neuromuscular re-education, Balance training, Gait training, Patient/Family education, Self Care, Joint mobilization, Joint manipulation, Stair training, Orthotic/Fit training, DME instructions, Aquatic Therapy, Dry Needling, Electrical stimulation, Cryotherapy, Moist heat, scar mobilization, Splintting, Taping, Ultrasound, Parrafin, Biofeedback, Ionotophoresis 25m/ml Dexamethasone, Manual therapy, and Re-evaluation.  PLAN FOR NEXT SESSION: Aquatics for strengthening LE/core; gait and balance retraining; stair climbing, aerobic capacity improvement; Pt edu/training, sup <>sit  MStanton Kidney(Tharon Aquas Gabrelle Roca MPT 03/22/22 2:12 PM [facility]Re[facility] Services [address]GCisco N[place] [address]Phone: [phone]  Fax:  [phone] Referring diagnosis? R hip pain Treatment diagnosis? (if different than referring diagnosis) right hip pain What was this (referring dx) caused by? []$  Surgery []$  Fall [x]$  Ongoing issue [x]$  Arthritis []$  Other: ____________  Laterality: []$  Rt []$  Lt [x]$  Both  Check all possible CPT codes:  *CHOOSE 10 OR LESS*    [x]$  97110 (Therapeutic Exercise)  []$  92507 (SLP Treatment)  []$  97112 (Neuro Re-ed)   []$  92526 (Swallowing Treatment)   [x]$  97116 (Gait Training)   []$  [id number](Cognitive Training, 1st 15 minutes) [x]$  97140 (Manual Therapy)   []$  97130 (Cognitive Training, each add'l 15 minutes)  [x]$  97164 (Re-evaluation)                              []$  Other, List CPT Code ____________  [x]$  [id number](Therapeutic Activities)     [x]$  97535 (Self Care)   []$  All codes above (97110 - 97535)  []$  97012 (Mechanical Traction)  [x]$  97014 (E-stim Unattended)  [x]$  97032 (E-stim manual)  [x]$  97033 (Ionto)  [x]$  960737(Ultrasound) [x]$  97750 (Physical Performance Training) [x]$  [id number](Aquatic Therapy) []$  97016 (Vasopneumatic Device) []$  [id number](Paraffin) []$  97034 (Contrast Bath) []$  97597 (Wound Care 1st 20 sq cm) []$  97598 (Wound Care each add'l 20 sq  cm) [x]$  97760 (Orthotic Fabrication, Fitting, Training Initial) []$  [id number](Prosthetic Management and Training Initial) []$  [id number](Orthotic or Prosthetic Training/ Modification Subsequent)

## 2022-03-26 ENCOUNTER — Other Ambulatory Visit: Payer: Self-pay | Admitting: Orthopedic Surgery

## 2022-03-26 DIAGNOSIS — M87051 Idiopathic aseptic necrosis of right femur: Secondary | ICD-10-CM

## 2022-03-27 ENCOUNTER — Ambulatory Visit (HOSPITAL_BASED_OUTPATIENT_CLINIC_OR_DEPARTMENT_OTHER): Payer: Medicare PPO | Admitting: Physical Therapy

## 2022-03-27 ENCOUNTER — Encounter (HOSPITAL_BASED_OUTPATIENT_CLINIC_OR_DEPARTMENT_OTHER): Payer: Self-pay | Admitting: Physical Therapy

## 2022-03-27 DIAGNOSIS — M25551 Pain in right hip: Secondary | ICD-10-CM | POA: Diagnosis not present

## 2022-03-27 DIAGNOSIS — M5459 Other low back pain: Secondary | ICD-10-CM

## 2022-03-27 DIAGNOSIS — R2689 Other abnormalities of gait and mobility: Secondary | ICD-10-CM | POA: Diagnosis not present

## 2022-03-27 DIAGNOSIS — M6281 Muscle weakness (generalized): Secondary | ICD-10-CM | POA: Diagnosis not present

## 2022-03-27 NOTE — Therapy (Signed)
OUTPATIENT PHYSICAL THERAPY THORACOLUMBAR TREATMENT   Patient Name: Wanda Lang MRN: [id number] DOB:July 15, 1950, 72 y.o., female Today's Date: 03/27/2022  END OF SESSION:  PT End of Session - 03/27/22 1432     Visit Number 8    Number of Visits 20    Date for PT Re-Evaluation 04/26/22    Authorization Type human mcr    Progress Note Due on Visit 10    PT Start Time 1415   Pt late for appointment: fell asleep   PT Stop Time 1445    PT Time Calculation (min) 30 min    Activity Tolerance Patient tolerated treatment well    Behavior During Therapy WFL for tasks assessed/performed             Past Medical History:  Diagnosis Date   Allergy    Arthritis    Chronic kidney disease    Coronary artery disease    GERD (gastroesophageal reflux disease)    Heart murmur    Hypertension    LIVER FUNCTION TESTS, ABNORMAL, HX OF 09/08/2007   Qualifier: Diagnosis of  By: Harlon Ditty CMA (AAMA), Dottie     Past Surgical History:  Procedure Laterality Date   ABDOMINAL HYSTERECTOMY     COLONOSCOPY WITH PROPOFOL N/A 09/04/2018   Procedure: COLONOSCOPY WITH PROPOFOL;  Surgeon: Virgel Manifold, MD;  Location: [facility];  Service: Endoscopy;  Laterality: N/A;   ESOPHAGOGASTRODUODENOSCOPY (EGD) WITH PROPOFOL N/A 09/04/2018   Procedure: ESOPHAGOGASTRODUODENOSCOPY (EGD) WITH PROPOFOL;  Surgeon: Virgel Manifold, MD;  Location: [facility];  Service: Endoscopy;  Laterality: N/A;   LAPAROSCOPY     TONSILLECTOMY     TOTAL KNEE ARTHROPLASTY Left 07/18/2021   Procedure: TOTAL KNEE ARTHROPLASTY;  Surgeon: Paralee Cancel, MD;  Location: [facility];  Service: Orthopedics;  Laterality: Left;   TURBINATE REDUCTION     Patient Active Problem List   Diagnosis Date Noted   Unilateral primary osteoarthritis, right hip 03/20/2022   Dysuria 01/07/2022   Postmenopausal estrogen deficiency 11/13/2021   Leg length inequality 11/06/2021   Impaired ambulation 10/23/2021   Contact dermatitis  10/16/2021   SI joint arthritis 08/24/2021   Spinal stenosis, lumbar region, without neurogenic claudication 08/24/2021   Degeneration of lumbar intervertebral disc 07/24/2021   Stiffness of left knee 07/21/2021   S/P total knee arthroplasty, left 07/18/2021   Preop examination 07/07/2021   Right hip pain 07/07/2021   Low back pain 07/07/2021   Drug-induced immunodeficiency ([place]) 06/12/2021   Impingement syndrome of right shoulder region 06/12/2021   Preop cardiovascular exam 06/08/2021   COVID-19 01/24/2021   Hammer toe 04/22/2020   Metatarsalgia of left foot 04/22/2020   Osteoarthritis of midfoot 04/22/2020   Duodenal ulcer 11/12/2019   Stress 11/12/2019   Morbid obesity ([place]) 11/06/2019   Pain in left knee 07/30/2019   Nonrheumatic aortic valve stenosis 04/14/2018   Heart murmur 02/06/2018   Maxillary sinusitis, chronic 10/24/2017   Chronic diarrhea 10/09/2017   Acid reflux 07/15/2017   Asymptomatic carotid artery stenosis 07/15/2017   Cyst of skin 07/15/2017   Immunocompromised ([place]) 11/09/2016   Allergic rhinitis 11/02/2016   Tremor 11/02/2016   History of hypertension 11/02/2016   Postmenopausal symptoms 07/01/2015   Seasonal allergic rhinitis due to pollen 07/01/2015   Fatty liver 09/08/2007   Rheumatoid arthritis ([place]) 09/08/2007    PCP: Leone Haven, MD   REFERRING PROVIDER: Charlett Blake, MD     REFERRING DIAG: [phone] (ICD-10-CM) - Right hip pain   Rationale for  Evaluation and Treatment: Rehabilitation  THERAPY DIAG:  Pain in right hip  Other low back pain  Muscle weakness (generalized)  Other abnormalities of gait and mobility  ONSET DATE: ~ 6 months  SUBJECTIVE:                                                                                                                                                                                           SUBJECTIVE STATEMENT:  "I haven't slept well the last 4 night due to hip pain" "my neck  is feeling tight some in the back and my left hand ha gone to sleep a time or 2"   PAIN:  Are you having pain? Yes: NPRS scale: currently 7-8/10  worst 8/10 Pain location: Right SI through posterior and anterior hip Pain description: sharp quality to ache Aggravating factors: weight bearing Relieving factors: Oral meds,lidocaine patches, TENS sitting, lying down  PRECAUTIONS: Fall  WEIGHT BEARING RESTRICTIONS: No  FALLS:  Has patient fallen in last 6 months? No  LIVING ENVIRONMENT: Lives with: lives alone Lives in: House/apartment Stairs:  2 steps into sunk in living room to kitchen and bathroom.  Has a hand rail Has following equipment at home: Gilford Rile - 2 wheeled  OCCUPATION: retired Pharmacist, hospital  PLOF: Independent with household mobility with device  PATIENT GOALS: walk better, decrease pain, be able to transfer (car, into bed) better, get around home safer  NEXT MD VISIT: Feb Dr Letta Pate  OBJECTIVE:   DIAGNOSTIC FINDINGS:   IMPRESSION: x-ray 02/24/22 1. Osteolysis of the right femoral head which has significantly progressed compared with 06/16/2019 and severe right hip joint space narrowing as can be seen with rapidly progressive osteoarthritis. Septic arthritis is considered much less likely.   MRI lumbar spine 10/23/21 IMPRESSION: 1. Widespread lumbar spine degeneration with no acute osseous abnormality. Mild scoliosis and multilevel spondylolisthesis   2. Mild to moderate multifactorial spinal stenosis at L2-L3. Mild left lateral recess stenosis at L1-L2 and L4-L5. Up to moderate neural foraminal stenosis at the left L2, right L3, left L4 and left L5 nerve levels.   3. Partially visible distended gallbladder. Query right upper quadrant pain.  PATIENT SURVEYS:  FOTO 26% with goal of 51 at visit 15 03/20/22 21%   COGNITION: Overall cognitive status: Within functional limits for tasks assessed     SENSATION: WFL  MUSCLE LENGTH: Hamstrings:  wfl   POSTURE: rounded shoulders, weight shift left, and right hip slightly higher than left  PALPATION: Right hip At gr trochanter TTP  LUMBAR ROM:   Limited with side bending Increased hip pain  LOWER EXTREMITY ROM:     Active  Right eval Left eval  Hip flexion 40d active 90 passive 110 active  Hip extension    Hip abduction    Hip adduction    Hip internal rotation 20   Hip external rotation 30   Knee flexion 110 120  Knee extension 0 0 stiff  Ankle dorsiflexion wfl wfl  Ankle plantarflexion    Ankle inversion    Ankle eversion     (Blank rows = not tested)  LOWER EXTREMITY MMT:    MMT Right eval Left eval  Hip flexion 3- 3+  Hip extension    Hip abduction 3- 5  Hip adduction 4 5  Hip internal rotation    Hip external rotation    Knee flexion 5 4+  Knee extension 5 4+  Ankle dorsiflexion    Ankle plantarflexion    Ankle inversion    Ankle eversion     (Blank rows = not tested)  LUMBAR SPECIAL TESTS:  Marcello Moores test: Negative  FUNCTIONAL TESTS:  TBA at later date as tolerated  GAIT: Distance walked: 46f Assistive device utilized: WEnvironmental consultant- 2 wheeled Level of assistance: Modified independence Comments: decreased stance time lle, right leaning, lack of right hip flex just clearing right foot from floor. Cadence slowed and antalgic  TODAY'S TREATMENT:                                                                                                                              Pt seen for aquatic therapy today.  Treatment took place in water 3.5 ft in depth at the [facility] Temp of water was 91.  Pt entered/exited the pool via chair lift.   *Seated on chair lift: cycling; add/abd, SLR and LAQ alternating    - cervical retraction; scapular depression  *walking forward and back 4.549fue support white barbell multiple widths. Cues for heel strike as tolerated  *side stepping white barbell x 4 widths. Cues for increasing speed as tolerated  by right hip for increased resistance  *yellow noodle wrapped anteriorly ue support on walls:prone suspension with kicking         Pt requires the buoyancy and hydrostatic pressure of water for support, and to offload joints by unweighting joint load by at least 50 % in navel deep water and by at least 75-80% in chest to neck deep water.  Viscosity of the water is needed for resistance of strengthening. Water current perturbations provides challenge to standing balance requiring increased core activation.    PATIENT EDUCATION:  Education details: aquatics modifications/ progressions  Person educated: Patient Education method: Explanation Education comprehension: verbalized understanding and returned demonstration  HOME EXERCISE PROGRAM: Access Code: [email or web address]: [email or web address] Date: 02/15/2022 Prepared by: FrDenton MeekExercises - Supine Posterior Pelvic Tilt  - 1 x daily - 7 x weekly - 3 sets - 10 reps - Supine Single Leg Ankle Pumps  - 1 x daily - 7 x weekly - 3 sets -  10 reps - Supine Heel Slide  - 1 x daily - 7 x weekly - 3 sets - 10 reps - Supine Hip Abduction  - 1 x daily - 7 x weekly - 3 sets - 10 reps - Active Straight Leg Raise with Quad Set  - 1 x daily - 7 x weekly - 3 sets - 10 reps  ASSESSMENT:  CLINICAL IMPRESSION: Pt instructed that it is ok to sleep in recliner as she has been unable to get any rest lying in bed last 4 nights although once in her recliner she falls asleep. Late for appointment today due to falling asleep in recliner prior to session. Increased pain sx last few days.  She is late for appointment so we are limited with time. Did add cervical and shoulder retraction to improve posture/positioning. They are added to HEP.        Initial assessment Patient is a 72 y.o. F who was seen today for physical therapy evaluation and treatment for R hip pain.   She reports she has lost 65 lb since Last January needed to have left TKR.   She presents today using FWW and has a lifted right shoe.  Pt reports SI and LBP as well as right hip pain.  Leg length difference since TKR but I am suspicious she has had an advanced right hip OA which may have caused the shortening.  She does report a slight lumbar scoliosis but does not appear to be significant. Right hip strength deficits along with IR and ER limitations in range.  Right SI pain improved when lift was added to shoe. She has an order for an xray right hip. She will benefit from skilled PT physical therapy beginning with aquatic intervention progressing to land based when appropriate.  She will progress with improved tolerance using the properties of water to improve strength, ROM, decrease pain, improve balance/gait translating to improvement in ADL's and functional mobility.  OBJECTIVE IMPAIRMENTS: Abnormal gait, decreased activity tolerance, decreased balance, decreased endurance, decreased mobility, difficulty walking, decreased ROM, decreased strength, decreased safety awareness, increased muscle spasms, impaired flexibility, improper body mechanics, postural dysfunction, obesity, and pain.   ACTIVITY LIMITATIONS: carrying, lifting, bending, sitting, standing, squatting, sleeping, stairs, transfers, bed mobility, and locomotion level  PARTICIPATION LIMITATIONS: meal prep, cleaning, laundry, driving, shopping, community activity, occupation, and yard work  PERSONAL FACTORS: Age, Fitness, Past/current experiences, Time since onset of injury/illness/exacerbation, and 1-2 comorbidities: obesity, leg length difference  are also affecting patient's functional outcome.   REHAB POTENTIAL: Good  CLINICAL DECISION MAKING: Evolving/moderate complexity  EVALUATION COMPLEXITY: Moderate   GOALS: Goals reviewed with patient? Yes  SHORT TERM GOALS: Target date: 03/22/22  Pt will tolerate aquatic therapy sessions without increase in pain to demonstrate positive response to  intervention Baseline:TBA Goal status: Met 03/20/22  2.  Pt will tolerate driving without increase in pain Baseline: barely tolerable Goal status: Met 03/20/22  3.  Pt will be able to move right foot on and off of car pedals with ease. Baseline: much difficulty and pain Goal status: Met 03/20/22  4.  Pt will improve rle hip flex and abd strength to 3+/5 or > to demonstrate the safe ability to leverage RLE with all transfers with improved safety. Baseline: 3- Goal status: INITIAL  5.  Pt to be indep and safe with putting fww into car then getting into drivers side Baseline: "worse thing I do very unsteady" Goal status: ongoing 03/20/22  6.  Pt will use stairs  into and out of pool to improve strength with completion improving safety with stairs at home. Baseline:  Goal status: ongoing 03/20/22  LONG TERM GOALS: Target date: 04/26/22  Foto goal of 51 will be met to demonstrate pt improved perception of her functional mobility Baseline: 26% Goal status: INITIAL  2.  Pt to decrease max pain to <or=4/10 to demonstrate improvement in management of condition. Baseline: 8/10 Goal status: INITIAL  3.  Pt will be able to lift her rle into bed without UE assistance demonstrating an improvement in strength Baseline: UE lifts rle into bed Goal status: INITIAL  4.  Pt will be indep with final HEP (land and/or aquatic as indicated) for long term management of condition Baseline: basic Sup exercises Goal status: INITIAL  5.  Pt will improve strength in all weak areas up at least 1 full grade to improve functional mobility and ADL's Baseline: see chart   PLAN:  PT FREQUENCY: 1-2x/week  PT DURATION: 10 weeks  PLANNED INTERVENTIONS: Therapeutic exercises, Therapeutic activity, Neuromuscular re-education, Balance training, Gait training, Patient/Family education, Self Care, Joint mobilization, Joint manipulation, Stair training, Orthotic/Fit training, DME instructions, Aquatic Therapy, Dry  Needling, Electrical stimulation, Cryotherapy, Moist heat, scar mobilization, Splintting, Taping, Ultrasound, Parrafin, Biofeedback, Ionotophoresis 33m/ml Dexamethasone, Manual therapy, and Re-evaluation.  PLAN FOR NEXT SESSION: Aquatics for strengthening LE/core; gait and balance retraining; stair climbing, aerobic capacity improvement; Pt edu/training, sup <>sit  MAnnamarie Major Priyah Schmuck MPT 03/27/22 2:34 PM [facility] [address] [address]Phone: [phone]  Fax:  [phone] Referring diagnosis? R hip pain Treatment diagnosis? (if different than referring diagnosis) right hip pain What was this (referring dx) caused by? []$  Surgery []$  Fall [x]$  Ongoing issue [x]$  Arthritis []$  Other: ____________  Laterality: []$  Rt []$  Lt [x]$  Both  Check all possible CPT codes:  *CHOOSE 10 OR LESS*    [x]$  97110 (Therapeutic Exercise)  []$  92507 (SLP Treatment)  []$  97112 (Neuro Re-ed)   []$  92526 (Swallowing Treatment)   [x]$  97116 (Gait Training)   []$  [id number](Cognitive Training, 1st 15 minutes) [x]$  97140 (Manual Therapy)   []$  97130 (Cognitive Training, each add'l 15 minutes)  [x]$  97164 (Re-evaluation)                              []$  Other, List CPT Code ____________  [x]$  [id number](Therapeutic Activities)     [x]$  97535 (Self Care)   []$  All codes above (97110 - 97535)  []$  97012 (Mechanical Traction)  [x]$  97014 (E-stim Unattended)  [x]$  97032 (E-stim manual)  [x]$  97033 (Ionto)  [x]$  97035 (Ultrasound) [x]$  97750 (Physical Performance Training) [x]$  [id number](Aquatic Therapy) []$  97016 (Vasopneumatic Device) []$  [id number](Paraffin) []$  97034 (Contrast Bath) []$  97597 (Wound Care 1st 20 sq cm) []$  97598 (Wound Care each add'l 20 sq cm) [x]$  97760 (Orthotic Fabrication, Fitting, Training Initial) []$  [id number](Prosthetic Management and Training Initial) []$  [id number](Orthotic or Prosthetic Training/ Modification Subsequent)

## 2022-03-29 ENCOUNTER — Ambulatory Visit (HOSPITAL_BASED_OUTPATIENT_CLINIC_OR_DEPARTMENT_OTHER): Payer: Medicare PPO | Admitting: Physical Therapy

## 2022-03-29 ENCOUNTER — Encounter (HOSPITAL_BASED_OUTPATIENT_CLINIC_OR_DEPARTMENT_OTHER): Payer: Self-pay | Admitting: Physical Therapy

## 2022-03-29 DIAGNOSIS — M6281 Muscle weakness (generalized): Secondary | ICD-10-CM

## 2022-03-29 DIAGNOSIS — M25551 Pain in right hip: Secondary | ICD-10-CM

## 2022-03-29 DIAGNOSIS — M5459 Other low back pain: Secondary | ICD-10-CM

## 2022-03-29 DIAGNOSIS — R2689 Other abnormalities of gait and mobility: Secondary | ICD-10-CM

## 2022-03-29 NOTE — Therapy (Signed)
OUTPATIENT PHYSICAL THERAPY THORACOLUMBAR TREATMENT   Patient Name: Wanda Lang MRN: [id number] DOB:12-04-1950, 72 y.o., female Today's Date: 03/29/2022  END OF SESSION:  PT End of Session - 03/29/22 1407     Visit Number 9    Number of Visits 20    Date for PT Re-Evaluation 04/26/22    Authorization Type human MCR    Progress Note Due on Visit 10    PT Start Time [id number]    PT Stop Time 1440    PT Time Calculation (min) 38 min    Behavior During Therapy WFL for tasks assessed/performed             Past Medical History:  Diagnosis Date   Allergy    Arthritis    Chronic kidney disease    Coronary artery disease    GERD (gastroesophageal reflux disease)    Heart murmur    Hypertension    LIVER FUNCTION TESTS, ABNORMAL, HX OF 09/08/2007   Qualifier: Diagnosis of  By: Harlon Ditty CMA (AAMA), Dottie     Past Surgical History:  Procedure Laterality Date   ABDOMINAL HYSTERECTOMY     COLONOSCOPY WITH PROPOFOL N/A 09/04/2018   Procedure: COLONOSCOPY WITH PROPOFOL;  Surgeon: Virgel Manifold, MD;  Location: [facility];  Service: Endoscopy;  Laterality: N/A;   ESOPHAGOGASTRODUODENOSCOPY (EGD) WITH PROPOFOL N/A 09/04/2018   Procedure: ESOPHAGOGASTRODUODENOSCOPY (EGD) WITH PROPOFOL;  Surgeon: Virgel Manifold, MD;  Location: [facility];  Service: Endoscopy;  Laterality: N/A;   LAPAROSCOPY     TONSILLECTOMY     TOTAL KNEE ARTHROPLASTY Left 07/18/2021   Procedure: TOTAL KNEE ARTHROPLASTY;  Surgeon: Paralee Cancel, MD;  Location: [facility];  Service: Orthopedics;  Laterality: Left;   TURBINATE REDUCTION     Patient Active Problem List   Diagnosis Date Noted   Unilateral primary osteoarthritis, right hip 03/20/2022   Dysuria 01/07/2022   Postmenopausal estrogen deficiency 11/13/2021   Leg length inequality 11/06/2021   Impaired ambulation 10/23/2021   Contact dermatitis 10/16/2021   SI joint arthritis 08/24/2021   Spinal stenosis, lumbar region, without  neurogenic claudication 08/24/2021   Degeneration of lumbar intervertebral disc 07/24/2021   Stiffness of left knee 07/21/2021   S/P total knee arthroplasty, left 07/18/2021   Preop examination 07/07/2021   Right hip pain 07/07/2021   Low back pain 07/07/2021   Drug-induced immunodeficiency ([place]) 06/12/2021   Impingement syndrome of right shoulder region 06/12/2021   Preop cardiovascular exam 06/08/2021   COVID-19 01/24/2021   Hammer toe 04/22/2020   Metatarsalgia of left foot 04/22/2020   Osteoarthritis of midfoot 04/22/2020   Duodenal ulcer 11/12/2019   Stress 11/12/2019   Morbid obesity ([place]) 11/06/2019   Pain in left knee 07/30/2019   Nonrheumatic aortic valve stenosis 04/14/2018   Heart murmur 02/06/2018   Maxillary sinusitis, chronic 10/24/2017   Chronic diarrhea 10/09/2017   Acid reflux 07/15/2017   Asymptomatic carotid artery stenosis 07/15/2017   Cyst of skin 07/15/2017   Immunocompromised ([place]) 11/09/2016   Allergic rhinitis 11/02/2016   Tremor 11/02/2016   History of hypertension 11/02/2016   Postmenopausal symptoms 07/01/2015   Seasonal allergic rhinitis due to pollen 07/01/2015   Fatty liver 09/08/2007   Rheumatoid arthritis ([place]) 09/08/2007    PCP: Leone Haven, MD   REFERRING PROVIDER: Charlett Blake, MD     REFERRING DIAG: [phone] (ICD-10-CM) - Right hip pain   Rationale for Evaluation and Treatment: Rehabilitation  THERAPY DIAG:  Pain in right hip  Other low back  pain  Muscle weakness (generalized)  Other abnormalities of gait and mobility  ONSET DATE: ~ 6 months  SUBJECTIVE:                                                                                                                                                                                           SUBJECTIVE STATEMENT:  "I've slept in the recliner for the last 2 nights.  I just can't get comfortable.  I've been hearing more crunching in that (Rt) hip lately".  Pt  reports she is scheduled for hip surgery with Dr. Ninfa Linden on 4/9.     PAIN:  Are you having pain? Yes: NPRS scale: currently 6-8/10  worst 8/10 Pain location: Right SI through posterior and anterior hip Pain description: sharp quality to ache Aggravating factors: weight bearing on RLE Relieving factors: Oral meds,lidocaine patches, TENS sitting, lying down  PRECAUTIONS: Fall  WEIGHT BEARING RESTRICTIONS: No  FALLS:  Has patient fallen in last 6 months? No  LIVING ENVIRONMENT: Lives with: lives alone Lives in: House/apartment Stairs:  2 steps into sunk in living room to kitchen and bathroom.  Has a hand rail Has following equipment at home: Gilford Rile - 2 wheeled  OCCUPATION: retired Pharmacist, hospital  PLOF: Independent with household mobility with device  PATIENT GOALS: walk better, decrease pain, be able to transfer (car, into bed) better, get around home safer  NEXT MD VISIT: Feb Dr Letta Pate  OBJECTIVE:   DIAGNOSTIC FINDINGS:   IMPRESSION: x-ray 02/24/22 1. Osteolysis of the right femoral head which has significantly progressed compared with 06/16/2019 and severe right hip joint space narrowing as can be seen with rapidly progressive osteoarthritis. Septic arthritis is considered much less likely.   MRI lumbar spine 10/23/21 IMPRESSION: 1. Widespread lumbar spine degeneration with no acute osseous abnormality. Mild scoliosis and multilevel spondylolisthesis   2. Mild to moderate multifactorial spinal stenosis at L2-L3. Mild left lateral recess stenosis at L1-L2 and L4-L5. Up to moderate neural foraminal stenosis at the left L2, right L3, left L4 and left L5 nerve levels.   3. Partially visible distended gallbladder. Query right upper quadrant pain.  PATIENT SURVEYS:  FOTO 26% with goal of 51 at visit 15 03/20/22 21%   COGNITION: Overall cognitive status: Within functional limits for tasks assessed     SENSATION: WFL  MUSCLE LENGTH: Hamstrings: wfl   POSTURE:  rounded shoulders, weight shift left, and right hip slightly higher than left  PALPATION: Right hip At gr trochanter TTP  LUMBAR ROM:   Limited with side bending Increased hip pain  LOWER EXTREMITY ROM:     Active  Right eval Left  eval  Hip flexion 40d active 90 passive 110 active  Hip extension    Hip abduction    Hip adduction    Hip internal rotation 20   Hip external rotation 30   Knee flexion 110 120  Knee extension 0 0 stiff  Ankle dorsiflexion wfl wfl  Ankle plantarflexion    Ankle inversion    Ankle eversion     (Blank rows = not tested)  LOWER EXTREMITY MMT:    MMT Right eval Left eval  Hip flexion 3- 3+  Hip extension    Hip abduction 3- 5  Hip adduction 4 5  Hip internal rotation    Hip external rotation    Knee flexion 5 4+  Knee extension 5 4+  Ankle dorsiflexion    Ankle plantarflexion    Ankle inversion    Ankle eversion     (Blank rows = not tested)  LUMBAR SPECIAL TESTS:  Marcello Moores test: Negative  FUNCTIONAL TESTS:  TBA at later date as tolerated  GAIT: Distance walked: 416f Assistive device utilized: WEnvironmental consultant- 2 wheeled Level of assistance: Modified independence Comments: decreased stance time lle, right leaning, lack of right hip flex just clearing right foot from floor. Cadence slowed and antalgic  TODAY'S TREATMENT:                                                                                                                              Pt seen for aquatic therapy today.  Treatment took place in water 3.5-4+ ft [facility] Temp of water was 91.  Pt entered/exited the pool via chair lift.   *Seated on chair lift: cycling; add/abd, LAQ with DF, and flutter kick, repeated circuit   * walking forward and backward 4.279f facing lap pool, UE support white barbell multiple 3 laps. Cues for heel strike as tolerated  *side stepping white barbell x 4 widths. Cues for increasing speed as tolerated by right hip  for increased resistance  *Holding wall:  Lt SLS with Rt hamstring curls x 10; Rt hip ext x 10  *yellow noodle wrapped anteriorly under UE, holding support on walls: Cc ski, hip abdct/ addct, cycling (not tolerated), LEs suspended with ankle inversion/eversion, DF/PF , cc ski, hip abdct/ addct    Pt requires the buoyancy and hydrostatic pressure of water for support, and to offload joints by unweighting joint load by at least 50 % in navel deep water and by at least 75-80% in chest to neck deep water.  Viscosity of the water is needed for resistance of strengthening. Water current perturbations provides challenge to standing balance requiring increased core activation.    PATIENT EDUCATION:  Education details: aquatics modifications/ progressions  Person educated: Patient Education method: Explanation Education comprehension: verbalized understanding and returned demonstration  HOME EXERCISE PROGRAM: Access Code: [email or web address]: [email or web address] Date: 02/15/2022 Prepared by: FrDenton MeekExercises - Supine Posterior Pelvic Tilt  - 1 x  daily - 7 x weekly - 3 sets - 10 reps - Supine Single Leg Ankle Pumps  - 1 x daily - 7 x weekly - 3 sets - 10 reps - Supine Heel Slide  - 1 x daily - 7 x weekly - 3 sets - 10 reps - Supine Hip Abduction  - 1 x daily - 7 x weekly - 3 sets - 10 reps - Active Straight Leg Raise with Quad Set  - 1 x daily - 7 x weekly - 3 sets - 10 reps  ASSESSMENT:  CLINICAL IMPRESSION: Pt reporting increased pain sx over last few days. She has difficulty tolerating cycling motion as it produces pain in posterior R hip (regardless of sitting or LE suspended in deeper water).  She tolerates gait in 4+ ft of water when Lt leg is on the lower slope of pool, but fatigues quickly.  She is scheduled for hip replacement in beginning of April.  PT to assess goals for progress note, 10th visit and assign some bed level hip pre-hab exercises.     Initial  assessment Patient is a 71 y.o. F who was seen today for physical therapy evaluation and treatment for R hip pain.   She reports she has lost 65 lb since Last January needed to have left TKR.  She presents today using FWW and has a lifted right shoe.  Pt reports SI and LBP as well as right hip pain.  Leg length difference since TKR but I am suspicious she has had an advanced right hip OA which may have caused the shortening.  She does report a slight lumbar scoliosis but does not appear to be significant. Right hip strength deficits along with IR and ER limitations in range.  Right SI pain improved when lift was added to shoe. She has an order for an xray right hip. She will benefit from skilled PT physical therapy beginning with aquatic intervention progressing to land based when appropriate.  She will progress with improved tolerance using the properties of water to improve strength, ROM, decrease pain, improve balance/gait translating to improvement in ADL's and functional mobility.  OBJECTIVE IMPAIRMENTS: Abnormal gait, decreased activity tolerance, decreased balance, decreased endurance, decreased mobility, difficulty walking, decreased ROM, decreased strength, decreased safety awareness, increased muscle spasms, impaired flexibility, improper body mechanics, postural dysfunction, obesity, and pain.   ACTIVITY LIMITATIONS: carrying, lifting, bending, sitting, standing, squatting, sleeping, stairs, transfers, bed mobility, and locomotion level  PARTICIPATION LIMITATIONS: meal prep, cleaning, laundry, driving, shopping, community activity, occupation, and yard work  PERSONAL FACTORS: Age, Fitness, Past/current experiences, Time since onset of injury/illness/exacerbation, and 1-2 comorbidities: obesity, leg length difference  are also affecting patient's functional outcome.   REHAB POTENTIAL: Good  CLINICAL DECISION MAKING: Evolving/moderate complexity  EVALUATION COMPLEXITY:  Moderate   GOALS: Goals reviewed with patient? Yes  SHORT TERM GOALS: Target date: 03/22/22  Pt will tolerate aquatic therapy sessions without increase in pain to demonstrate positive response to intervention Baseline:TBA Goal status: Met 03/20/22  2.  Pt will tolerate driving without increase in pain Baseline: barely tolerable Goal status: Met 03/20/22  3.  Pt will be able to move right foot on and off of car pedals with ease. Baseline: much difficulty and pain Goal status: Met 03/20/22  4.  Pt will improve rle hip flex and abd strength to 3+/5 or > to demonstrate the safe ability to leverage RLE with all transfers with improved safety. Baseline: 3- Goal status: INITIAL  5.  Pt to  be indep and safe with putting fww into car then getting into drivers side Baseline: "worse thing I do very unsteady" Goal status: ongoing 03/20/22  6.  Pt will use stairs into and out of pool to improve strength with completion improving safety with stairs at home. Baseline:  Goal status: ongoing 03/20/22  LONG TERM GOALS: Target date: 04/26/22  Foto goal of 51 will be met to demonstrate pt improved perception of her functional mobility Baseline: 26% Goal status: INITIAL  2.  Pt to decrease max pain to <or=4/10 to demonstrate improvement in management of condition. Baseline: 8/10 Goal status: INITIAL  3.  Pt will be able to lift her rle into bed without UE assistance demonstrating an improvement in strength Baseline: UE lifts rle into bed Goal status: INITIAL  4.  Pt will be indep with final HEP (land and/or aquatic as indicated) for long term management of condition Baseline: basic Sup exercises Goal status: INITIAL  5.  Pt will improve strength in all weak areas up at least 1 full grade to improve functional mobility and ADL's Baseline: see chart   PLAN:  PT FREQUENCY: 1-2x/week  PT DURATION: 10 weeks  PLANNED INTERVENTIONS: Therapeutic exercises, Therapeutic activity, Neuromuscular  re-education, Balance training, Gait training, Patient/Family education, Self Care, Joint mobilization, Joint manipulation, Stair training, Orthotic/Fit training, DME instructions, Aquatic Therapy, Dry Needling, Electrical stimulation, Cryotherapy, Moist heat, scar mobilization, Splintting, Taping, Ultrasound, Parrafin, Biofeedback, Ionotophoresis 14m/ml Dexamethasone, Manual therapy, and Re-evaluation.  PLAN FOR NEXT SESSION: Aquatics for strengthening LE/core; gait and balance retraining; stair climbing, aerobic capacity improvement; Pt edu/training, sup <>sit  JKerin Perna PTA 03/29/22 5:06 PM [facility] [address] [place] [address]Phone: [phone]  Fax:  [phone]  Referring diagnosis? R hip pain Treatment diagnosis? (if different than referring diagnosis) right hip pain What was this (referring dx) caused by? []$  Surgery []$  Fall [x]$  Ongoing issue [x]$  Arthritis []$  Other: ____________  Laterality: []$  Rt []$  Lt [x]$  Both  Check all possible CPT codes:  *CHOOSE 10 OR LESS*    [x]$  97110 (Therapeutic Exercise)  []$  92507 (SLP Treatment)  []$  97112 (Neuro Re-ed)   []$  92526 (Swallowing Treatment)   [x]$  97116 (Gait Training)   []$  [id number](Cognitive Training, 1st 15 minutes) [x]$  97140 (Manual Therapy)   []$  97130 (Cognitive Training, each add'l 15 minutes)  [x]$  97164 (Re-evaluation)                              []$  Other, List CPT Code ____________  [x]$  [id number](Therapeutic Activities)     [x]$  97535 (Self Care)   []$  All codes above (97110 - 97535)  []$  97012 (Mechanical Traction)  [x]$  97014 (E-stim Unattended)  [x]$  97032 (E-stim manual)  [x]$  97033 (Ionto)  [x]$  97035 (Ultrasound) [x]$  97750 (Physical Performance Training) [x]$  [id number](Aquatic Therapy) []$  97016 (Vasopneumatic Device) []$  [id number](Paraffin) []$  97034 (Contrast Bath) []$  97597 (Wound Care 1st 20 sq cm) []$  97598 (Wound Care each add'l 20 sq cm) [x]$   97760 (Orthotic Fabrication, Fitting, Training Initial) []$  [id number](Prosthetic Management and Training Initial) []$  [id number](Orthotic or Prosthetic Training/ Modification Subsequent)

## 2022-04-02 ENCOUNTER — Ambulatory Visit: Admission: RE | Admit: 2022-04-02 | Payer: Medicare PPO | Source: Ambulatory Visit

## 2022-04-02 DIAGNOSIS — M0589 Other rheumatoid arthritis with rheumatoid factor of multiple sites: Secondary | ICD-10-CM | POA: Diagnosis not present

## 2022-04-03 ENCOUNTER — Encounter (HOSPITAL_BASED_OUTPATIENT_CLINIC_OR_DEPARTMENT_OTHER): Payer: Medicare PPO

## 2022-04-04 ENCOUNTER — Encounter: Payer: Self-pay | Admitting: Internal Medicine

## 2022-04-04 ENCOUNTER — Ambulatory Visit: Payer: Medicare PPO | Admitting: Internal Medicine

## 2022-04-04 VITALS — BP 128/64 | HR 68 | Temp 98.0°F | Ht 64.5 in | Wt 198.6 lb

## 2022-04-04 DIAGNOSIS — M069 Rheumatoid arthritis, unspecified: Secondary | ICD-10-CM | POA: Diagnosis not present

## 2022-04-04 DIAGNOSIS — M2170 Unequal limb length (acquired), unspecified site: Secondary | ICD-10-CM

## 2022-04-04 DIAGNOSIS — I350 Nonrheumatic aortic (valve) stenosis: Secondary | ICD-10-CM | POA: Diagnosis not present

## 2022-04-04 DIAGNOSIS — G250 Essential tremor: Secondary | ICD-10-CM

## 2022-04-04 DIAGNOSIS — Z01818 Encounter for other preprocedural examination: Secondary | ICD-10-CM

## 2022-04-04 DIAGNOSIS — N3281 Overactive bladder: Secondary | ICD-10-CM | POA: Diagnosis not present

## 2022-04-04 MED ORDER — SOLIFENACIN SUCCINATE 5 MG PO TABS: 5.0000 mg | ORAL_TABLET | Freq: Every day | ORAL | 0 refills | Status: DC

## 2022-04-04 NOTE — Progress Notes (Unsigned)
Subjective:  Patient ID: Wanda Lang, female    DOB: [birth year]  Age: 72 y.o. MRN: [id number]  CC: The primary encounter diagnosis was Rheumatoid arthritis, involving unspecified site, unspecified whether rheumatoid factor present ([place]). Diagnoses of Preoperative evaluation of a medical condition to rule out surgical contraindications (TAR required), Overactive bladder, Benign essential tremor, Aortic stenosis, moderate, Leg length inequality, and Morbid obesity ([place]) were also pertinent to this visit.   HPI Wanda Lang presents for  Chief Complaint  Patient presents with   Medical Management of Chronic Issues    Follow up from UTI   Wanda Lang is a 72 yr old female with lumbar spinal stenosis , rheumatoid arthritis, obesity with fatty liver,  who presents for follow up on recent UTI and overactive bladder.  .  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  She is scheduled to have an anterior approach Replacement  on April 9 by Zollie Beckers .   She denies depression,  but currently has been In too much pain to  feel positive about anything because her pain is debilitating. She is currently taking oxycodone prescribed by [facility], and she is taking colace daily to prevent constipation.  She is getting PT . Preoperative she has been advised  to stop placquenil  2 weeks ahead,  celebrex a week ahead,  and Orencia has already been suspended    2) Treated for UTI in December.  She continues to have incontinence,  and is requesting treatment for OAB . Nocturia x 3,  with urinary incontinence (urge happening 2-3 times daily .   3) Preoperative evaluation:    she has no history of  chest pain. She has aortic stenosis,  non critical by  Echo last done Nov 2023 ([facility])  4) BET: started in hands,  then right foot, now in mouth.  No trouble eating  .    Outpatient Medications Prior to Visit   Medication Sig Dispense Refill   acetaminophen (TYLENOL) 500 MG tablet Take 1,000 mg by mouth every 6 (six) hours as needed.     CALCIUM PO Take 1,200 mg by mouth daily.     celecoxib (CELEBREX) 200 MG capsule Take 1 capsule (200 mg total) by mouth 2 (two) times daily. Do not start this medication until you finish the prednisone dose pack. 60 capsule 0   cholecalciferol (VITAMIN D3) 25 MCG (1000 UNIT) tablet Take 1,000 Units by mouth daily.     cycloSPORINE (RESTASIS) 0.05 % ophthalmic emulsion Place 1 drop into both eyes 2 (two) times daily.     Docusate Sodium (DSS) 100 MG CAPS Take 1 capsule by mouth 2 (two) times daily.     estradiol (VIVELLE-DOT) 0.05 MG/24HR patch Place 1 patch onto the skin 2 (two) times a week.     Flaxseed, Linseed, (FLAXSEED OIL) 1200 MG CAPS Take 1,200 mg by mouth daily.     Glucosamine HCl 1000 MG TABS Take 1,000 mg by mouth daily.     hydroxychloroquine (PLAQUENIL) 200 MG tablet Take 200 mg by mouth 2 (two) times daily.     lidocaine (LIDODERM) 5 % Place 1 patch onto the skin every 12 (twelve) hours. Remove & Discard patch within 12 hours or as directed by MD 10 patch 1   methocarbamol (ROBAXIN) 500 MG tablet Take 1 tablet by mouth every 6 (six) hours as needed.     mometasone (ELOCON) 0.1 %  cream Apply topically to affected areas behind ears and neck twice daily for itchy rash use for up to 2 weeks. Can also use at itchy areas at abdomen. 50 g 1   OVER THE COUNTER MEDICATION Take 1 mL by mouth daily. CBD oil     oxyCODONE (OXY IR/ROXICODONE) 5 MG immediate release tablet Take 1 tablet (5 mg total) by mouth every 6 (six) hours as needed. 10 tablet 0   polyethylene glycol (MIRALAX / GLYCOLAX) 17 g packet Take 17 g by mouth daily as needed for mild constipation. 14 each 0   POTASSIUM PO Take 3 tablets by mouth daily.     magnesium gluconate (MAGONATE) 500 MG tablet Take 500 mg by mouth 3 (three) times a week. (Patient not taking: Reported on 04/04/2022)      pseudoephedrine (SUDAFED) 120 MG 12 hr tablet Take 60-120 mg by mouth daily as needed for congestion. (Patient not taking: Reported on 04/04/2022)     TURMERIC CURCUMIN PO Take 1 capsule by mouth daily. (Patient not taking: Reported on 04/04/2022)     No facility-administered medications prior to visit.    Review of Systems;  Patient denies headache, fevers, malaise, unintentional weight loss, skin rash, eye pain, sinus congestion and sinus pain, sore throat, dysphagia,  hemoptysis , cough, dyspnea, wheezing, chest pain, palpitations, orthopnea, edema, abdominal pain, nausea, melena, diarrhea, constipation, flank pain, dysuria, hematuria, urinary  Frequency, nocturia, numbness, tingling, seizures,  Focal weakness, Loss of consciousness,  Tremor, insomnia, depression, anxiety, and suicidal ideation.      Objective:  BP 128/64   Pulse 68   Temp 98 F (36.7 C) (Oral)   Ht 5' 4.5" (1.638 m)   Wt 198 lb 9.6 oz (90.1 kg)   SpO2 98%   BMI 33.56 kg/m   BP Readings from Last 3 Encounters:  04/04/22 128/64  03/08/22 113/73  02/02/22 122/81    Wt Readings from Last 3 Encounters:  04/04/22 198 lb 9.6 oz (90.1 kg)  03/20/22 191 lb (86.6 kg)  03/08/22 195 lb (88.5 kg)    Physical Exam Vitals reviewed.  Constitutional:      General: She is not in acute distress.    Appearance: Normal appearance. She is normal weight. She is not ill-appearing, toxic-appearing or diaphoretic.  HENT:     Head: Normocephalic.  Eyes:     General: No scleral icterus.       Right eye: No discharge.        Left eye: No discharge.     Conjunctiva/sclera: Conjunctivae normal.  Cardiovascular:     Rate and Rhythm: Normal rate and regular rhythm.     Pulses: Normal pulses.     Heart sounds: Murmur heard.     Systolic murmur is present with a grade of 2/6.  Pulmonary:     Effort: Pulmonary effort is normal. No respiratory distress.     Breath sounds: Normal breath sounds.  Musculoskeletal:        General:  Normal range of motion.     Right lower leg: No edema.     Left lower leg: No edema.  Skin:    General: Skin is warm and dry.  Neurological:     General: No focal deficit present.     Mental Status: She is alert and oriented to person, place, and time. Mental status is at baseline.  Psychiatric:        Mood and Affect: Mood normal.        Behavior:  Behavior normal.        Thought Content: Thought content normal.        Judgment: Judgment normal.    Lab Results  Component Value Date   HGBA1C 5.6 07/07/2021   HGBA1C 5.6 11/30/2020   HGBA1C 5.6 11/06/2019    Lab Results  Component Value Date   CREATININE 0.8 02/01/2022   CREATININE 0.60 01/05/2022   CREATININE 0.82 10/23/2021    Lab Results  Component Value Date   WBC 6.0 02/01/2022   HGB 13.1 02/01/2022   HCT 40 02/01/2022   PLT 164 02/01/2022   GLUCOSE 80 01/05/2022   CHOL 177 01/05/2022   TRIG 103 01/05/2022   HDL 57 01/05/2022   LDLCALC 100 (H) 01/05/2022   ALT 10 02/01/2022   AST 25 02/01/2022   NA 142 02/01/2022   K 3.7 02/01/2022   CL 105 02/01/2022   CREATININE 0.8 02/01/2022   BUN 20 02/01/2022   CO2 28 (A) 02/01/2022   TSH 2.31 01/05/2022   INR 1.0 07/07/2021   HGBA1C 5.6 07/07/2021    DG HIP UNILAT WITH PELVIS 2-3 VIEWS RIGHT  Result Date: 02/24/2022 CLINICAL DATA:  Right hip pain EXAM: DG HIP (WITH OR WITHOUT PELVIS) 2-3V RIGHT COMPARISON:  Right SI joint injection CT 10/24/2021 Hip x-ray 06/16/2019 FINDINGS: Generalized osteopenia. No acute fracture or dislocation. Osteolysis of the right femoral head which has significantly progressed compared with 06/16/2019 and severe right hip joint space narrowing as can be seen with rapidly progressive osteoarthritis. No aggressive osseous lesion. Normal alignment. Soft tissue are unremarkable. No radiopaque foreign body or soft tissue emphysema. IMPRESSION: 1. Osteolysis of the right femoral head which has significantly progressed compared with 06/16/2019 and  severe right hip joint space narrowing as can be seen with rapidly progressive osteoarthritis. Septic arthritis is considered much less likely. Electronically Signed   By: Kathreen Devoid M.D.   On: 02/24/2022 09:23    Assessment & Plan:  .Rheumatoid arthritis, involving unspecified site, unspecified whether rheumatoid factor present ([place]) Assessment & Plan: MANAGED WITH PLACQUENIL BY RHEUMATOLOGY.  LAST EYE EXAM WAS REVIEWED    Preoperative evaluation of a medical condition to rule out surgical contraindications (TAR required) Assessment & Plan: She has RA managed with placquenil and Orencia which will be suspended prior to surgery April 9.  Reviewed labs from  Dec 28 including CBC and BMET    Overactive bladder Assessment & Plan: With urge incontinence.  Trial of vesicare starting at 5 mg    Benign essential tremor Assessment & Plan: The tremor is mild and not interfering with tasks. No treatment currently requested    Aortic stenosis, moderate Assessment & Plan: Mild to moderate by ov 2023 ECHO .  She will need cardiology clearance by Miquel Dunn for hip replacement.    Leg length inequality Assessment & Plan: Secondary to an acetabulum nearly obliterated by osteoarthritis by recent films of right hip    Morbid obesity ([place]) Assessment & Plan: She is eager to get on with her hip replacement so she can return to [employer]/    Other orders -     Solifenacin Succinate; Take 1 tablet (5 mg total) by mouth daily.  Dispense: 90 tablet; Refill: 0     I provided 30 minutes of face-to-face time during this encounter reviewing patient's last visit with me, patient's  most recent visit with cardiology,  Orthopedics,  and Rheumatology ,   recent surgical and non surgical procedures, previous  labs and  imaging studies, counseling on currently addressed issues,  and post visit ordering to diagnostics and therapeutics .   Follow-up: No follow-ups on file.   Crecencio Mc,  MD

## 2022-04-04 NOTE — Assessment & Plan Note (Signed)
With urge incontinence.  Trial of vesicare starting at 5 mg

## 2022-04-04 NOTE — Assessment & Plan Note (Signed)
She has RA managed with placquenil and Orencia which will be suspended prior to surgery April 9.  Reviewed labs from  Dec 28 including CBC and BMET

## 2022-04-04 NOTE — Assessment & Plan Note (Signed)
MANAGED WITH PLACQUENIL BY RHEUMATOLOGY.  LAST EYE EXAM WAS REVIEWED

## 2022-04-05 ENCOUNTER — Ambulatory Visit (HOSPITAL_BASED_OUTPATIENT_CLINIC_OR_DEPARTMENT_OTHER): Payer: Medicare PPO | Admitting: Physical Therapy

## 2022-04-05 ENCOUNTER — Encounter (HOSPITAL_BASED_OUTPATIENT_CLINIC_OR_DEPARTMENT_OTHER): Payer: Self-pay | Admitting: Physical Therapy

## 2022-04-05 DIAGNOSIS — M7541 Impingement syndrome of right shoulder: Secondary | ICD-10-CM | POA: Diagnosis not present

## 2022-04-05 DIAGNOSIS — M6281 Muscle weakness (generalized): Secondary | ICD-10-CM

## 2022-04-05 DIAGNOSIS — R2689 Other abnormalities of gait and mobility: Secondary | ICD-10-CM

## 2022-04-05 DIAGNOSIS — M2042 Other hammer toe(s) (acquired), left foot: Secondary | ICD-10-CM | POA: Diagnosis not present

## 2022-04-05 DIAGNOSIS — M5459 Other low back pain: Secondary | ICD-10-CM

## 2022-04-05 DIAGNOSIS — M5136 Other intervertebral disc degeneration, lumbar region: Secondary | ICD-10-CM | POA: Diagnosis not present

## 2022-04-05 DIAGNOSIS — M25551 Pain in right hip: Secondary | ICD-10-CM | POA: Diagnosis not present

## 2022-04-05 NOTE — Assessment & Plan Note (Signed)
She is eager to get on with her hip replacement so she can return to [employer]/

## 2022-04-05 NOTE — Assessment & Plan Note (Signed)
The tremor is mild and not interfering with tasks. No treatment currently requested

## 2022-04-05 NOTE — Assessment & Plan Note (Signed)
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

## 2022-04-05 NOTE — Assessment & Plan Note (Signed)
Mild to moderate by ov 2023 ECHO .  She will need cardiology clearance by Miquel Dunn for hip replacement.

## 2022-04-05 NOTE — Therapy (Signed)
OUTPATIENT PHYSICAL THERAPY THORACOLUMBAR TREATMENT   Patient Name: Wanda Lang MRN: [id number] DOB:[birth year], 72 y.o., female Today's Date: 04/05/2022  END OF SESSION:  PT End of Session - 04/05/22 1354     Visit Number 10    Number of Visits 20    Date for PT Re-Evaluation 05/14/22    Authorization Type human MCR    Progress Note Due on Visit 25    PT Start Time 1353    PT Stop Time 1420    PT Time Calculation (min) 27 min    Behavior During Therapy WFL for tasks assessed/performed              Past Medical History:  Diagnosis Date   Allergy    Arthritis    Chronic kidney disease    Coronary artery disease    GERD (gastroesophageal reflux disease)    Heart murmur    Hypertension    LIVER FUNCTION TESTS, ABNORMAL, HX OF 09/08/2007   Qualifier: Diagnosis of  By: Harlon Ditty CMA (AAMA), Dottie     Past Surgical History:  Procedure Laterality Date   ABDOMINAL HYSTERECTOMY     COLONOSCOPY WITH PROPOFOL N/A 09/04/2018   Procedure: COLONOSCOPY WITH PROPOFOL;  Surgeon: Virgel Manifold, MD;  Location: [facility];  Service: Endoscopy;  Laterality: N/A;   ESOPHAGOGASTRODUODENOSCOPY (EGD) WITH PROPOFOL N/A 09/04/2018   Procedure: ESOPHAGOGASTRODUODENOSCOPY (EGD) WITH PROPOFOL;  Surgeon: Virgel Manifold, MD;  Location: [facility];  Service: Endoscopy;  Laterality: N/A;   LAPAROSCOPY     TONSILLECTOMY     TOTAL KNEE ARTHROPLASTY Left 07/18/2021   Procedure: TOTAL KNEE ARTHROPLASTY;  Surgeon: Paralee Cancel, MD;  Location: [facility];  Service: Orthopedics;  Laterality: Left;   TURBINATE REDUCTION     Patient Active Problem List   Diagnosis Date Noted   Overactive bladder 04/04/2022   Unilateral primary osteoarthritis, right hip 03/20/2022   Dysuria 01/07/2022   Postmenopausal estrogen deficiency 11/13/2021   Leg length inequality 11/06/2021   Impaired ambulation 10/23/2021   Contact dermatitis 10/16/2021   SI joint arthritis 08/24/2021   Spinal  stenosis, lumbar region, without neurogenic claudication 08/24/2021   Degeneration of lumbar intervertebral disc 07/24/2021   Stiffness of left knee 07/21/2021   S/P total knee arthroplasty, left 07/18/2021   Preop examination 07/07/2021   Right hip pain 07/07/2021   Low back pain 07/07/2021   Drug-induced immunodeficiency ([place]) 06/12/2021   Impingement syndrome of right shoulder region 06/12/2021   Preoperative evaluation of a medical condition to rule out surgical contraindications (TAR required) 06/08/2021   COVID-19 01/24/2021   Hammer toe 04/22/2020   Metatarsalgia of left foot 04/22/2020   Osteoarthritis of midfoot 04/22/2020   Duodenal ulcer 11/12/2019   Stress 11/12/2019   Morbid obesity ([place]) 11/06/2019   Pain in left knee 07/30/2019   Aortic stenosis, moderate 02/06/2018   Maxillary sinusitis, chronic 10/24/2017   Chronic diarrhea 10/09/2017   Acid reflux 07/15/2017   Asymptomatic carotid artery stenosis 07/15/2017   Cyst of skin 07/15/2017   Immunocompromised ([place]) 11/09/2016   Allergic rhinitis 11/02/2016   Benign essential tremor 11/02/2016   History of hypertension 11/02/2016   Postmenopausal symptoms 07/01/2015   Seasonal allergic rhinitis due to pollen 07/01/2015   Fatty liver 09/08/2007   Rheumatoid arthritis ([place]) 09/08/2007    PCP: Leone Haven, MD   REFERRING PROVIDER: Charlett Blake, MD     REFERRING DIAG: [phone] (ICD-10-CM) - Right hip pain   Rationale for Evaluation and Treatment: Rehabilitation  THERAPY DIAG:  Pain in right hip  Other low back pain  Muscle weakness (generalized)  Other abnormalities of gait and mobility  ONSET DATE: ~ 6 months  SUBJECTIVE:                      Progress Note Reporting Period 02/15/22 to 04/05/2022  See note below for Objective Data and Assessment of Progress/Goals.                                                                                                                                                                     SUBJECTIVE STATEMENT: Driving is really hard   PAIN:  Are you having pain? Yes: NPRS scale: currently 6-8/10  worst 8/10 Pain location: Right SI through posterior and anterior hip Pain description: sharp quality to ache Aggravating factors: weight bearing on RLE Relieving factors: Oral meds,lidocaine patches, TENS sitting, lying down  PRECAUTIONS: Fall  WEIGHT BEARING RESTRICTIONS: No  FALLS:  Has patient fallen in last 6 months? No  LIVING ENVIRONMENT: Lives with: lives alone Lives in: House/apartment Stairs:  2 steps into sunk in living room to kitchen and bathroom.  Has a hand rail Has following equipment at home: Gilford Rile - 2 wheeled  OCCUPATION: retired Pharmacist, hospital  PLOF: Independent with household mobility with device  PATIENT GOALS: walk better, decrease pain, be able to transfer (car, into bed) better, get around home safer  NEXT MD VISIT: Feb Dr Letta Pate  OBJECTIVE:   DIAGNOSTIC FINDINGS:   IMPRESSION: x-ray 02/24/22 1. Osteolysis of the right femoral head which has significantly progressed compared with 06/16/2019 and severe right hip joint space narrowing as can be seen with rapidly progressive osteoarthritis. Septic arthritis is considered much less likely.   MRI lumbar spine 10/23/21 IMPRESSION: 1. Widespread lumbar spine degeneration with no acute osseous abnormality. Mild scoliosis and multilevel spondylolisthesis   2. Mild to moderate multifactorial spinal stenosis at L2-L3. Mild left lateral recess stenosis at L1-L2 and L4-L5. Up to moderate neural foraminal stenosis at the left L2, right L3, left L4 and left L5 nerve levels.   3. Partially visible distended gallbladder. Query right upper quadrant pain.  PATIENT SURVEYS:  FOTO 26% with goal of 51 at visit 15 03/20/22 21%   COGNITION: Overall cognitive status: Within functional limits for tasks assessed     SENSATION: WFL  MUSCLE LENGTH: Hamstrings:  wfl   POSTURE: rounded shoulders, weight shift left, and right hip slightly higher than left  PALPATION: Right hip At gr trochanter TTP  LUMBAR ROM:   Limited with side bending Increased hip pain  LOWER EXTREMITY ROM:     Active  Right eval Left eval  Hip flexion 40d active 90 passive 110 active  Hip extension  Hip abduction    Hip adduction    Hip internal rotation 20   Hip external rotation 30   Knee flexion 110 120  Knee extension 0 0 stiff  Ankle dorsiflexion wfl wfl  Ankle plantarflexion    Ankle inversion    Ankle eversion     (Blank rows = not tested)  LOWER EXTREMITY MMT:    MMT Right eval Left eval  Hip flexion 3- 3+  Hip extension    Hip abduction 3- 5  Hip adduction 4 5  Hip internal rotation    Hip external rotation    Knee flexion 5 4+  Knee extension 5 4+  Ankle dorsiflexion    Ankle plantarflexion    Ankle inversion    Ankle eversion     (Blank rows = not tested)  LUMBAR SPECIAL TESTS:  Marcello Moores test: Negative  FUNCTIONAL TESTS:  TBA at later date as tolerated  GAIT: Distance walked: 463f Assistive device utilized: WEnvironmental consultant- 2 wheeled Level of assistance: Modified independence Comments: decreased stance time lle, right leaning, lack of right hip flex just clearing right foot from floor. Cadence slowed and antalgic  TODAY'S TREATMENT:                                                                                                                              Treatment                            04/05/22:  Discussion regarding POC and anatomy of condition LAQ with ankle pump   PATIENT EDUCATION:  Education details: aquatics modifications/ progressions  Person educated: Patient Education method: Explanation Education comprehension: verbalized understanding and returned demonstration  HOME EXERCISE PROGRAM: Access Code: [email or web address]: [email or web address]   ASSESSMENT:  CLINICAL IMPRESSION: Patient presents  today for progress note.  Upon looking at her x-ray and hearing symptoms, she is not appropriate for land-based therapy until after her hip replacement surgery.  Attempting to strengthen via land-based creates too high of a risk for the patient.  She is having her hip replacement done on April 19 and will continue with aquatic exercises in skilled PT on a weekly basis to monitor and [facility]  Plan of care extended to cover this timeframe. We did discuss exercises on land versus in the pool and the importance of maintaining strength, flexibility, and mobility in order to improve outcomes following surgery. Patient reports there are plans for her to go to a skilled nursing facility following surgery as she lives on her own and I encouraged her to obtain a referral to continue with outpatient physical therapy so that we can schedule her as soon as we know she will be discharged from the nursing facility.    Initial assessment Patient is a 72y.o. F who was seen today for  physical therapy evaluation and treatment for R hip pain.   She reports she has lost 65 lb since Last January needed to have left TKR.  She presents today using FWW and has a lifted right shoe.  Pt reports SI and LBP as well as right hip pain.  Leg length difference since TKR but I am suspicious she has had an advanced right hip OA which may have caused the shortening.  She does report a slight lumbar scoliosis but does not appear to be significant. Right hip strength deficits along with IR and ER limitations in range.  Right SI pain improved when lift was added to shoe. She has an order for an xray right hip. She will benefit from skilled PT physical therapy beginning with aquatic intervention progressing to land based when appropriate.  She will progress with improved tolerance using the properties of water to improve strength, ROM, decrease pain, improve balance/gait  translating to improvement in ADL's and functional mobility.  OBJECTIVE IMPAIRMENTS: Abnormal gait, decreased activity tolerance, decreased balance, decreased endurance, decreased mobility, difficulty walking, decreased ROM, decreased strength, decreased safety awareness, increased muscle spasms, impaired flexibility, improper body mechanics, postural dysfunction, obesity, and pain.   ACTIVITY LIMITATIONS: carrying, lifting, bending, sitting, standing, squatting, sleeping, stairs, transfers, bed mobility, and locomotion level  PARTICIPATION LIMITATIONS: meal prep, cleaning, laundry, driving, shopping, community activity, occupation, and yard work  PERSONAL FACTORS: Age, Fitness, Past/current experiences, Time since onset of injury/illness/exacerbation, and 1-2 comorbidities: obesity, leg length difference  are also affecting patient's functional outcome.   REHAB POTENTIAL: Good  CLINICAL DECISION MAKING: Evolving/moderate complexity  EVALUATION COMPLEXITY: Moderate   GOALS: Goals reviewed with patient? Yes  SHORT TERM GOALS: Target date: 03/22/22  Pt will tolerate aquatic therapy sessions without increase in pain to demonstrate positive response to intervention Baseline:TBA Goal status: Met 03/20/22  2.  Pt will tolerate driving without increase in pain Baseline: barely tolerable Goal status: Met 03/20/22  3.  Pt will be able to move right foot on and off of car pedals with ease. Baseline: much difficulty and pain Goal status: Met 03/20/22  4.  Pt will improve rle hip flex and abd strength to 3+/5 or > to demonstrate the safe ability to leverage RLE with all transfers with improved safety. Baseline: 3- Goal status: INITIAL  5.  Pt to be indep and safe with putting fww into car then getting into drivers side Baseline: "worse thing I do very unsteady" Goal status: ongoing 03/20/22  6.  Pt will use stairs into and out of pool to improve strength with completion improving safety with  stairs at home. Baseline:  Goal status: ongoing 03/20/22  LONG TERM GOALS: Target date: 04/26/22  Foto goal of 51 will be met to demonstrate pt improved perception of her functional mobility Baseline: 26% Goal status: INITIAL  2.  Pt to decrease max pain to <or=4/10 to demonstrate improvement in management of condition. Baseline: 8/10 Goal status: INITIAL  3.  Pt will be able to lift her rle into bed without UE assistance demonstrating an improvement in strength Baseline: UE lifts rle into bed Goal status: INITIAL  4.  Pt will be indep with final HEP (land and/or aquatic as indicated) for long term management of condition Baseline: basic Sup exercises Goal status: INITIAL  5.  Pt will improve strength in all weak areas up at least 1 full grade to improve functional mobility and ADL's Baseline: see chart   PLAN:  PT FREQUENCY: 1-2x/week  PT  DURATION: 10 weeks  PLANNED INTERVENTIONS: Therapeutic exercises, Therapeutic activity, Neuromuscular re-education, Balance training, Gait training, Patient/Family education, Self Care, Joint mobilization, Joint manipulation, Stair training, Orthotic/Fit training, DME instructions, Aquatic Therapy, Dry Needling, Electrical stimulation, Cryotherapy, Moist heat, scar mobilization, Splintting, Taping, Ultrasound, Parrafin, Biofeedback, Ionotophoresis '4mg'$ /ml Dexamethasone, Manual therapy, and Re-evaluation.  PLAN FOR NEXT SESSION: Aquatics for strengthening LE/core; gait and balance retraining; stair climbing, aerobic capacity improvement; Pt edu/training, sup <>sit  Dmonte Maher C. Amilcar Reever PT, DPT 04/05/22 5:02 PM    Referring diagnosis? R hip pain Treatment diagnosis? (if different than referring diagnosis) right hip pain What was this (referring dx) caused by? '[]'$  Surgery '[]'$  Fall '[x]'$  Ongoing issue '[x]'$  Arthritis '[]'$  Other: ____________  Laterality: '[]'$  Rt '[]'$  Lt '[x]'$  Both  Check all possible CPT codes:  *CHOOSE 10 OR LESS*    '[x]'$  97110  (Therapeutic Exercise)  '[]'$  92507 (SLP Treatment)  '[]'$  97112 (Neuro Re-ed)   '[]'$  92526 (Swallowing Treatment)   '[x]'$  97116 (Gait Training)   '[]'$  [id number] (Cognitive Training, 1st 15 minutes) '[x]'$  97140 (Manual Therapy)   '[]'$  97130 (Cognitive Training, each add'l 15 minutes)  '[x]'$  97164 (Re-evaluation)                              '[]'$  Other, List CPT Code ____________  '[x]'$  97530 (Therapeutic Activities)     '[x]'$  97535 (Self Care)   '[]'$  All codes above (97110 - 97535)  '[]'$  97012 (Mechanical Traction)  '[x]'$  97014 (E-stim Unattended)  '[x]'$  97032 (E-stim manual)  '[x]'$  97033 (Ionto)  '[x]'$  97035 (Ultrasound) '[x]'$  97750 (Physical Performance Training) '[x]'$  [id number] (Aquatic Therapy) '[]'$  97016 (Vasopneumatic Device) '[]'$  [id number] (Paraffin) '[]'$  97034 (Contrast Bath) '[]'$  97597 (Wound Care 1st 20 sq cm) '[]'$  97598 (Wound Care each add'l 20 sq cm) '[x]'$  97760 (Orthotic Fabrication, Fitting, Training Initial) '[]'$  [id number] (Prosthetic Management and Training Initial) '[]'$  [id number] (Orthotic or Prosthetic Training/ Modification Subsequent)

## 2022-04-06 ENCOUNTER — Other Ambulatory Visit: Payer: Self-pay

## 2022-04-10 ENCOUNTER — Encounter (HOSPITAL_BASED_OUTPATIENT_CLINIC_OR_DEPARTMENT_OTHER): Payer: Medicare PPO

## 2022-04-11 ENCOUNTER — Ambulatory Visit (HOSPITAL_BASED_OUTPATIENT_CLINIC_OR_DEPARTMENT_OTHER): Payer: Self-pay | Admitting: Physical Therapy

## 2022-04-11 ENCOUNTER — Ambulatory Visit: Payer: Medicare PPO | Admitting: Family Medicine

## 2022-04-12 ENCOUNTER — Other Ambulatory Visit: Payer: Self-pay | Admitting: Physician Assistant

## 2022-04-12 ENCOUNTER — Encounter (HOSPITAL_BASED_OUTPATIENT_CLINIC_OR_DEPARTMENT_OTHER): Payer: Medicare PPO | Admitting: Physical Therapy

## 2022-04-12 ENCOUNTER — Encounter: Payer: Self-pay | Admitting: Radiology

## 2022-04-12 DIAGNOSIS — Z01818 Encounter for other preprocedural examination: Secondary | ICD-10-CM

## 2022-04-18 ENCOUNTER — Ambulatory Visit (HOSPITAL_BASED_OUTPATIENT_CLINIC_OR_DEPARTMENT_OTHER): Payer: Medicare PPO | Attending: Physical Medicine & Rehabilitation | Admitting: Physical Therapy

## 2022-04-18 ENCOUNTER — Encounter (HOSPITAL_BASED_OUTPATIENT_CLINIC_OR_DEPARTMENT_OTHER): Payer: Self-pay | Admitting: Physical Therapy

## 2022-04-18 DIAGNOSIS — M25551 Pain in right hip: Secondary | ICD-10-CM | POA: Insufficient documentation

## 2022-04-18 DIAGNOSIS — M6281 Muscle weakness (generalized): Secondary | ICD-10-CM | POA: Diagnosis not present

## 2022-04-18 DIAGNOSIS — R2689 Other abnormalities of gait and mobility: Secondary | ICD-10-CM | POA: Diagnosis not present

## 2022-04-18 DIAGNOSIS — M5459 Other low back pain: Secondary | ICD-10-CM | POA: Diagnosis not present

## 2022-04-18 NOTE — Therapy (Signed)
OUTPATIENT PHYSICAL THERAPY THORACOLUMBAR TREATMENT   Patient Name: Wanda Lang MRN: [id number] DOB:1951/01/24, 72 y.o., female Today's Date: 04/18/2022  END OF SESSION:  PT End of Session - 04/18/22 1645     Visit Number 11    Number of Visits 20    Date for PT Re-Evaluation 05/14/22    Authorization Type human MCR    Progress Note Due on Visit 88    PT Start Time 1634    PT Stop Time [id number]    PT Time Calculation (min) 41 min    Behavior During Therapy WFL for tasks assessed/performed              Past Medical History:  Diagnosis Date   Allergy    Arthritis    Chronic kidney disease    Coronary artery disease    GERD (gastroesophageal reflux disease)    Heart murmur    Hypertension    LIVER FUNCTION TESTS, ABNORMAL, HX OF 09/08/2007   Qualifier: Diagnosis of  By: Harlon Ditty CMA (AAMA), Dottie     Past Surgical History:  Procedure Laterality Date   ABDOMINAL HYSTERECTOMY     COLONOSCOPY WITH PROPOFOL N/A 09/04/2018   Procedure: COLONOSCOPY WITH PROPOFOL;  Surgeon: Virgel Manifold, MD;  Location: [facility];  Service: Endoscopy;  Laterality: N/A;   ESOPHAGOGASTRODUODENOSCOPY (EGD) WITH PROPOFOL N/A 09/04/2018   Procedure: ESOPHAGOGASTRODUODENOSCOPY (EGD) WITH PROPOFOL;  Surgeon: Virgel Manifold, MD;  Location: [facility];  Service: Endoscopy;  Laterality: N/A;   LAPAROSCOPY     TONSILLECTOMY     TOTAL KNEE ARTHROPLASTY Left 07/18/2021   Procedure: TOTAL KNEE ARTHROPLASTY;  Surgeon: Paralee Cancel, MD;  Location: [facility];  Service: Orthopedics;  Laterality: Left;   TURBINATE REDUCTION     Patient Active Problem List   Diagnosis Date Noted   Overactive bladder 04/04/2022   Unilateral primary osteoarthritis, right hip 03/20/2022   Dysuria 01/07/2022   Postmenopausal estrogen deficiency 11/13/2021   Leg length inequality 11/06/2021   Impaired ambulation 10/23/2021   Contact dermatitis 10/16/2021   SI joint arthritis 08/24/2021   Spinal  stenosis, lumbar region, without neurogenic claudication 08/24/2021   Degeneration of lumbar intervertebral disc 07/24/2021   Stiffness of left knee 07/21/2021   S/P total knee arthroplasty, left 07/18/2021   Preop examination 07/07/2021   Right hip pain 07/07/2021   Low back pain 07/07/2021   Drug-induced immunodeficiency ([place]) 06/12/2021   Impingement syndrome of right shoulder region 06/12/2021   Preoperative evaluation of a medical condition to rule out surgical contraindications (TAR required) 06/08/2021   COVID-19 01/24/2021   Hammer toe 04/22/2020   Metatarsalgia of left foot 04/22/2020   Osteoarthritis of midfoot 04/22/2020   Duodenal ulcer 11/12/2019   Stress 11/12/2019   Morbid obesity ([place]) 11/06/2019   Pain in left knee 07/30/2019   Aortic stenosis, moderate 02/06/2018   Maxillary sinusitis, chronic 10/24/2017   Chronic diarrhea 10/09/2017   Acid reflux 07/15/2017   Asymptomatic carotid artery stenosis 07/15/2017   Cyst of skin 07/15/2017   Immunocompromised ([place]) 11/09/2016   Allergic rhinitis 11/02/2016   Benign essential tremor 11/02/2016   History of hypertension 11/02/2016   Postmenopausal symptoms 07/01/2015   Seasonal allergic rhinitis due to pollen 07/01/2015   Fatty liver 09/08/2007   Rheumatoid arthritis ([place]) 09/08/2007    PCP: Leone Haven, MD   REFERRING PROVIDER: Charlett Blake, MD     REFERRING DIAG: [id number] (ICD-10-CM) - Right hip pain   Rationale for Evaluation and Treatment: Rehabilitation  THERAPY DIAG:  Pain in right hip  Other low back pain  Muscle weakness (generalized)  Other abnormalities of gait and mobility  ONSET DATE: ~ 6 months  SUBJECTIVE:                                                                                                                                                                                     SUBJECTIVE STATEMENT: "Just didn't feel good last week, sorry I missed, my hip seems to be  hurting more."  PAIN:  Are you having pain? Yes: NPRS scale: currently 6-7/10  worst 8/10 Pain location: Right SI through posterior and anterior hip Pain description: sharp quality to ache Aggravating factors: weight bearing on RLE Relieving factors: Oral meds,lidocaine patches, TENS sitting, lying down  PRECAUTIONS: Fall  WEIGHT BEARING RESTRICTIONS: No  FALLS:  Has patient fallen in last 6 months? No  LIVING ENVIRONMENT: Lives with: lives alone Lives in: House/apartment Stairs:  2 steps into sunk in living room to kitchen and bathroom.  Has a hand rail Has following equipment at home: Gilford Rile - 2 wheeled  OCCUPATION: retired Pharmacist, hospital  PLOF: Independent with household mobility with device  PATIENT GOALS: walk better, decrease pain, be able to transfer (car, into bed) better, get around home safer  NEXT MD VISIT: Feb Dr Letta Pate  OBJECTIVE:   DIAGNOSTIC FINDINGS:   IMPRESSION: x-ray 02/24/22 1. Osteolysis of the right femoral head which has significantly progressed compared with 06/16/2019 and severe right hip joint space narrowing as can be seen with rapidly progressive osteoarthritis. Septic arthritis is considered much less likely.   MRI lumbar spine 10/23/21 IMPRESSION: 1. Widespread lumbar spine degeneration with no acute osseous abnormality. Mild scoliosis and multilevel spondylolisthesis   2. Mild to moderate multifactorial spinal stenosis at L2-L3. Mild left lateral recess stenosis at L1-L2 and L4-L5. Up to moderate neural foraminal stenosis at the left L2, right L3, left L4 and left L5 nerve levels.   3. Partially visible distended gallbladder. Query right upper quadrant pain.  PATIENT SURVEYS:  FOTO 26% with goal of 51 at visit 15 03/20/22 21%   COGNITION: Overall cognitive status: Within functional limits for tasks assessed     SENSATION: WFL  MUSCLE LENGTH: Hamstrings: wfl   POSTURE: rounded shoulders, weight shift left, and right hip  slightly higher than left  PALPATION: Right hip At gr trochanter TTP  LUMBAR ROM:   Limited with side bending Increased hip pain  LOWER EXTREMITY ROM:     Active  Right eval Left eval  Hip flexion 40d active 90 passive 110 active  Hip extension    Hip abduction    Hip adduction  Hip internal rotation 20   Hip external rotation 30   Knee flexion 110 120  Knee extension 0 0 stiff  Ankle dorsiflexion wfl wfl  Ankle plantarflexion    Ankle inversion    Ankle eversion     (Blank rows = not tested)  LOWER EXTREMITY MMT:    MMT Right eval Left eval  Hip flexion 3- 3+  Hip extension    Hip abduction 3- 5  Hip adduction 4 5  Hip internal rotation    Hip external rotation    Knee flexion 5 4+  Knee extension 5 4+  Ankle dorsiflexion    Ankle plantarflexion    Ankle inversion    Ankle eversion     (Blank rows = not tested)  LUMBAR SPECIAL TESTS:  Marcello Moores test: Negative  FUNCTIONAL TESTS:  TBA at later date as tolerated  GAIT: Distance walked: 471f Assistive device utilized: WEnvironmental consultant- 2 wheeled Level of assistance: Modified independence Comments: decreased stance time lle, right leaning, lack of right hip flex just clearing right foot from floor. Cadence slowed and antalgic  TODAY'S TREATMENT:                                                                                                                              Pt seen for aquatic therapy today.  Treatment took place in water 3.5-4+ ft [facility] Temp of water was 91.  Pt entered/exited the pool via chair lift.                 *Seated on chair lift: cycling; add/abd, LAQ with DF, and flutter kick, repeated circuit                * walking forward and backward 4.251f facing lap pool, UE support white barbell multiple laps.                *side stepping white barbell x 4 widths. Cues for increasing speed as tolerated by right hip for increased resistance  *supine suspension:  hip extension R/L. Pt completes only ~ 50% of range due to pain r hip; LAQ/hamstring curls; add/abd               *Holding wall:  Lt SLS with Rt hamstring curls x 10; Rt hip ext x 10; Rt hip circles cw&ccw, r hip add/abd; DF/PF    *yellow noodle wrapped anteriorly under UE,: lumbar rotation; prone hands on wall kicking       - LEs suspended with ankle inversion/eversion, DF/PF , cc ski, hip abdct/ addct                 Pt requires the buoyancy and hydrostatic pressure of water for support, and to offload joints by unweighting joint load by at least 50 % in navel deep water and by at least 75-80% in chest to neck deep water.  Viscosity of the water is needed  for resistance of strengthening. Water current perturbations provides challenge to standing balance requiring increased core activatio   PATIENT EDUCATION:  Education details: aquatics modifications/ progressions  Person educated: Patient Education method: Explanation Education comprehension: verbalized understanding and returned demonstration  HOME EXERCISE PROGRAM: Access Code: [email or web address] URL: [email or web address]   ASSESSMENT:  CLINICAL IMPRESSION: Able to gain isolated glut engagement well with suspended supine. She has intermittent sharp pains with exercises in left hip through session.  Modifying positioning improves. Goals ongoing. Correction to last note.  Surgical date is 05/15/22 (not 05/25/22)   Patient presents today for progress note.  Upon looking at her x-ray and hearing symptoms, she is not appropriate for land-based therapy until after her hip replacement surgery.  Attempting to strengthen via land-based creates too high of a risk for the patient.  She is having her hip replacement done on April 19 and will continue with aquatic exercises in skilled PT on a weekly basis to monitor and reports she will be able to go to the [facility] at least 1 more day of the week.  Plan of care extended to cover this timeframe. We  did discuss exercises on land versus in the pool and the importance of maintaining strength, flexibility, and mobility in order to improve outcomes following surgery. Patient reports there are plans for her to go to a skilled nursing facility following surgery as she lives on her own and I encouraged her to obtain a referral to continue with outpatient physical therapy so that we can schedule her as soon as we know she will be discharged from the nursing facility.   OBJECTIVE IMPAIRMENTS: Abnormal gait, decreased activity tolerance, decreased balance, decreased endurance, decreased mobility, difficulty walking, decreased ROM, decreased strength, decreased safety awareness, increased muscle spasms, impaired flexibility, improper body mechanics, postural dysfunction, obesity, and pain.   ACTIVITY LIMITATIONS: carrying, lifting, bending, sitting, standing, squatting, sleeping, stairs, transfers, bed mobility, and locomotion level  PARTICIPATION LIMITATIONS: meal prep, cleaning, laundry, driving, shopping, community activity, occupation, and yard work  PERSONAL FACTORS: Age, Fitness, Past/current experiences, Time since onset of injury/illness/exacerbation, and 1-2 comorbidities: obesity, leg length difference  are also affecting patient's functional outcome.   REHAB POTENTIAL: Good  CLINICAL DECISION MAKING: Evolving/moderate complexity  EVALUATION COMPLEXITY: Moderate   GOALS: Goals reviewed with patient? Yes  SHORT TERM GOALS: Target date: 03/22/22  Pt will tolerate aquatic therapy sessions without increase in pain to demonstrate positive response to intervention Baseline:TBA Goal status: Met 03/20/22  2.  Pt will tolerate driving without increase in pain Baseline: barely tolerable Goal status: Met 03/20/22  3.  Pt will be able to move right foot on and off of car pedals with ease. Baseline: much difficulty and pain Goal status: Met 03/20/22  4.  Pt will improve rle hip flex and abd  strength to 3+/5 or > to demonstrate the safe ability to leverage RLE with all transfers with improved safety. Baseline: 3- Goal status: INITIAL  5.  Pt to be indep and safe with putting fww into car then getting into drivers side Baseline: "worse thing I do very unsteady" Goal status: ongoing 03/20/22  6.  Pt will use stairs into and out of pool to improve strength with completion improving safety with stairs at home. Baseline:  Goal status: ongoing 03/20/22  LONG TERM GOALS: Target date: 04/26/22  Foto goal of 51 will be met to demonstrate pt improved perception of her functional mobility Baseline: 26% Goal status: INITIAL  2.  Pt to decrease max  pain to <or=4/10 to demonstrate improvement in management of condition. Baseline: 8/10 Goal status: INITIAL  3.  Pt will be able to lift her rle into bed without UE assistance demonstrating an improvement in strength Baseline: UE lifts rle into bed Goal status: INITIAL  4.  Pt will be indep with final HEP (land and/or aquatic as indicated) for long term management of condition Baseline: basic Sup exercises Goal status: INITIAL  5.  Pt will improve strength in all weak areas up at least 1 full grade to improve functional mobility and ADL's Baseline: see chart   PLAN:  PT FREQUENCY: 1-2x/week  PT DURATION: 10 weeks  PLANNED INTERVENTIONS: Therapeutic exercises, Therapeutic activity, Neuromuscular re-education, Balance training, Gait training, Patient/Family education, Self Care, Joint mobilization, Joint manipulation, Stair training, Orthotic/Fit training, DME instructions, Aquatic Therapy, Dry Needling, Electrical stimulation, Cryotherapy, Moist heat, scar mobilization, Splintting, Taping, Ultrasound, Parrafin, Biofeedback, Ionotophoresis '4mg'$ /ml Dexamethasone, Manual therapy, and Re-evaluation.  PLAN FOR NEXT SESSION: Aquatics for strengthening LE/core; gait and balance retraining; stair climbing, aerobic capacity improvement; Pt  edu/training, sup <>sit  Stanton Kidney Tharon Aquas) Mikele Sifuentes MPT 04/18/22 4:49 PM    Referring diagnosis? R hip pain Treatment diagnosis? (if different than referring diagnosis) right hip pain What was this (referring dx) caused by? '[]'$  Surgery '[]'$  Fall '[x]'$  Ongoing issue '[x]'$  Arthritis '[]'$  Other: ____________  Laterality: '[]'$  Rt '[]'$  Lt '[x]'$  Both  Check all possible CPT codes:  *CHOOSE 10 OR LESS*    '[x]'$  97110 (Therapeutic Exercise)  '[]'$  92507 (SLP Treatment)  '[]'$  97112 (Neuro Re-ed)   '[]'$  92526 (Swallowing Treatment)   '[x]'$  97116 (Gait Training)   '[]'$  [id number] (Cognitive Training, 1st 15 minutes) '[x]'$  97140 (Manual Therapy)   '[]'$  97130 (Cognitive Training, each add'l 15 minutes)  '[x]'$  97164 (Re-evaluation)                              '[]'$  Other, List CPT Code ____________  '[x]'$  [id number] (Therapeutic Activities)     '[x]'$  97535 (Self Care)   '[]'$  All codes above (97110 - 97535)  '[]'$  97012 (Mechanical Traction)  '[x]'$  97014 (E-stim Unattended)  '[x]'$  97032 (E-stim manual)  '[x]'$  97033 (Ionto)  '[x]'$  97035 (Ultrasound) '[x]'$  97750 (Physical Performance Training) '[x]'$  10272 (Aquatic Therapy) '[]'$  97016 (Vasopneumatic Device) '[]'$  [id number] (Paraffin) '[]'$  97034 (Contrast Bath) '[]'$  97597 (Wound Care 1st 20 sq cm) '[]'$  97598 (Wound Care each add'l 20 sq cm) '[x]'$  97760 (Orthotic Fabrication, Fitting, Training Initial) '[]'$  [id number] (Prosthetic Management and Training Initial) '[]'$  [id number] (Orthotic or Prosthetic Training/ Modification Subsequent)

## 2022-04-25 ENCOUNTER — Encounter (HOSPITAL_BASED_OUTPATIENT_CLINIC_OR_DEPARTMENT_OTHER): Payer: Self-pay | Admitting: Physical Therapy

## 2022-04-25 ENCOUNTER — Ambulatory Visit (HOSPITAL_BASED_OUTPATIENT_CLINIC_OR_DEPARTMENT_OTHER): Payer: Medicare PPO | Admitting: Physical Therapy

## 2022-04-25 DIAGNOSIS — M6281 Muscle weakness (generalized): Secondary | ICD-10-CM

## 2022-04-25 DIAGNOSIS — M25551 Pain in right hip: Secondary | ICD-10-CM | POA: Diagnosis not present

## 2022-04-25 DIAGNOSIS — Z79899 Other long term (current) drug therapy: Secondary | ICD-10-CM | POA: Diagnosis not present

## 2022-04-25 DIAGNOSIS — M1710 Unilateral primary osteoarthritis, unspecified knee: Secondary | ICD-10-CM | POA: Diagnosis not present

## 2022-04-25 DIAGNOSIS — J302 Other seasonal allergic rhinitis: Secondary | ICD-10-CM | POA: Diagnosis not present

## 2022-04-25 DIAGNOSIS — M150 Primary generalized (osteo)arthritis: Secondary | ICD-10-CM | POA: Diagnosis not present

## 2022-04-25 DIAGNOSIS — M0589 Other rheumatoid arthritis with rheumatoid factor of multiple sites: Secondary | ICD-10-CM | POA: Diagnosis not present

## 2022-04-25 DIAGNOSIS — M549 Dorsalgia, unspecified: Secondary | ICD-10-CM | POA: Diagnosis not present

## 2022-04-25 DIAGNOSIS — M5459 Other low back pain: Secondary | ICD-10-CM | POA: Diagnosis not present

## 2022-04-25 DIAGNOSIS — M2140 Flat foot [pes planus] (acquired), unspecified foot: Secondary | ICD-10-CM | POA: Diagnosis not present

## 2022-04-25 DIAGNOSIS — R2689 Other abnormalities of gait and mobility: Secondary | ICD-10-CM | POA: Diagnosis not present

## 2022-04-25 DIAGNOSIS — M25561 Pain in right knee: Secondary | ICD-10-CM | POA: Diagnosis not present

## 2022-04-25 NOTE — Therapy (Signed)
OUTPATIENT PHYSICAL THERAPY THORACOLUMBAR TREATMENT   Wanda Lang Name: Wanda Lang MRN: [id number] DOB:May 20, 1950, 72 y.o., female Today's Date: 04/25/2022  END OF SESSION:  PT End of Session - 04/25/22 1641     Visit Number 12    Number of Visits 20    Date for PT Re-Evaluation 05/14/22    Authorization Type human MCR    Progress Note Due on Visit 48    PT Start Time 1632    PT Stop Time [id number]    PT Time Calculation (min) 43 min    Activity Tolerance Wanda Lang tolerated treatment well    Behavior During Therapy WFL for tasks assessed/performed              Past Medical History:  Diagnosis Date   Allergy    Arthritis    Chronic kidney disease    Coronary artery disease    GERD (gastroesophageal reflux disease)    Heart murmur    Hypertension    LIVER FUNCTION TESTS, ABNORMAL, HX OF 09/08/2007   Qualifier: Diagnosis of  By: Harlon Ditty CMA (AAMA), Dottie     Past Surgical History:  Procedure Laterality Date   ABDOMINAL HYSTERECTOMY     COLONOSCOPY WITH PROPOFOL N/A 09/04/2018   Procedure: COLONOSCOPY WITH PROPOFOL;  Surgeon: Virgel Manifold, MD;  Location: [facility];  Service: Endoscopy;  Laterality: N/A;   ESOPHAGOGASTRODUODENOSCOPY (EGD) WITH PROPOFOL N/A 09/04/2018   Procedure: ESOPHAGOGASTRODUODENOSCOPY (EGD) WITH PROPOFOL;  Surgeon: Virgel Manifold, MD;  Location: [facility];  Service: Endoscopy;  Laterality: N/A;   LAPAROSCOPY     TONSILLECTOMY     TOTAL KNEE ARTHROPLASTY Left 07/18/2021   Procedure: TOTAL KNEE ARTHROPLASTY;  Surgeon: Paralee Cancel, MD;  Location: [facility];  Service: Orthopedics;  Laterality: Left;   TURBINATE REDUCTION     Wanda Lang Active Problem List   Diagnosis Date Noted   Overactive bladder 04/04/2022   Unilateral primary osteoarthritis, right hip 03/20/2022   Dysuria 01/07/2022   Postmenopausal estrogen deficiency 11/13/2021   Leg length inequality 11/06/2021   Impaired ambulation 10/23/2021   Contact dermatitis  10/16/2021   SI joint arthritis 08/24/2021   Spinal stenosis, lumbar region, without neurogenic claudication 08/24/2021   Degeneration of lumbar intervertebral disc 07/24/2021   Stiffness of left knee 07/21/2021   S/P total knee arthroplasty, left 07/18/2021   Preop examination 07/07/2021   Right hip pain 07/07/2021   Low back pain 07/07/2021   Drug-induced immunodeficiency ([place]) 06/12/2021   Impingement syndrome of right shoulder region 06/12/2021   Preoperative evaluation of a medical condition to rule out surgical contraindications (TAR required) 06/08/2021   COVID-19 01/24/2021   Hammer toe 04/22/2020   Metatarsalgia of left foot 04/22/2020   Osteoarthritis of midfoot 04/22/2020   Duodenal ulcer 11/12/2019   Stress 11/12/2019   Morbid obesity ([place]) 11/06/2019   Pain in left knee 07/30/2019   Aortic stenosis, moderate 02/06/2018   Maxillary sinusitis, chronic 10/24/2017   Chronic diarrhea 10/09/2017   Acid reflux 07/15/2017   Asymptomatic carotid artery stenosis 07/15/2017   Cyst of skin 07/15/2017   Immunocompromised ([place]) 11/09/2016   Allergic rhinitis 11/02/2016   Benign essential tremor 11/02/2016   History of hypertension 11/02/2016   Postmenopausal symptoms 07/01/2015   Seasonal allergic rhinitis due to pollen 07/01/2015   Fatty liver 09/08/2007   Rheumatoid arthritis ([place]) 09/08/2007    PCP: Leone Haven, MD   REFERRING PROVIDER: Charlett Blake, MD     REFERRING DIAG: [phone] (ICD-10-CM) - Right hip  pain   Rationale for Evaluation and Treatment: Rehabilitation  THERAPY DIAG:  Pain in right hip  Other low back pain  Muscle weakness (generalized)  ONSET DATE: ~ 6 months  SUBJECTIVE:                                                                                                                                                                                     SUBJECTIVE STATEMENT: "Just didn't feel good last week, sorry I missed, my hip  seems to be hurting more."  PAIN:  Are you having pain? Yes: NPRS scale: currently 6-7/10  worst 8/10 Pain location: Right SI through posterior and anterior hip Pain description: sharp quality to ache Aggravating factors: weight bearing on RLE Relieving factors: Oral meds,lidocaine patches, TENS sitting, lying down  PRECAUTIONS: Fall  WEIGHT BEARING RESTRICTIONS: No  FALLS:  Has Wanda Lang fallen in last 6 months? No  LIVING ENVIRONMENT: Lives with: lives alone Lives in: House/apartment Stairs:  2 steps into sunk in living room to kitchen and bathroom.  Has a hand rail Has following equipment at home: Gilford Rile - 2 wheeled  OCCUPATION: retired Pharmacist, hospital  PLOF: Independent with household mobility with device  Wanda Lang GOALS: walk better, decrease pain, be able to transfer (car, into bed) better, get around home safer  NEXT MD VISIT: Feb Dr Letta Pate  OBJECTIVE:   DIAGNOSTIC FINDINGS:   IMPRESSION: x-ray 02/24/22 1. Osteolysis of the right femoral head which has significantly progressed compared with 06/16/2019 and severe right hip joint space narrowing as can be seen with rapidly progressive osteoarthritis. Septic arthritis is considered much less likely.   MRI lumbar spine 10/23/21 IMPRESSION: 1. Widespread lumbar spine degeneration with no acute osseous abnormality. Mild scoliosis and multilevel spondylolisthesis   2. Mild to moderate multifactorial spinal stenosis at L2-L3. Mild left lateral recess stenosis at L1-L2 and L4-L5. Up to moderate neural foraminal stenosis at the left L2, right L3, left L4 and left L5 nerve levels.   3. Partially visible distended gallbladder. Query right upper quadrant pain.  Wanda Lang SURVEYS:  FOTO 26% with goal of 51 at visit 15 03/20/22 21%   COGNITION: Overall cognitive status: Within functional limits for tasks assessed     SENSATION: WFL  MUSCLE LENGTH: Hamstrings: wfl   POSTURE: rounded shoulders, weight shift left, and  right hip slightly higher than left  PALPATION: Right hip At gr trochanter TTP  LUMBAR ROM:   Limited with side bending Increased hip pain  LOWER EXTREMITY ROM:     Active  Right eval Left eval  Hip flexion 40d active 90 passive 110 active  Hip extension    Hip abduction  Hip adduction    Hip internal rotation 20   Hip external rotation 30   Knee flexion 110 120  Knee extension 0 0 stiff  Ankle dorsiflexion wfl wfl  Ankle plantarflexion    Ankle inversion    Ankle eversion     (Blank rows = not tested)  LOWER EXTREMITY MMT:    MMT Right eval Left eval  Hip flexion 3- 3+  Hip extension    Hip abduction 3- 5  Hip adduction 4 5  Hip internal rotation    Hip external rotation    Knee flexion 5 4+  Knee extension 5 4+  Ankle dorsiflexion    Ankle plantarflexion    Ankle inversion    Ankle eversion     (Blank rows = not tested)  LUMBAR SPECIAL TESTS:  Marcello Moores test: Negative  FUNCTIONAL TESTS:  TBA at later date as tolerated  GAIT: Distance walked: 437ft Assistive device utilized: Environmental consultant - 2 wheeled Level of assistance: Modified independence Comments: decreased stance time lle, right leaning, lack of right hip flex just clearing right foot from floor. Cadence slowed and antalgic  TODAY'S TREATMENT:                                                                                                                              Pt seen for aquatic therapy today.  Treatment took place in water 3.5-4+ ft in depth at the [facility] pool. Temp of water was 91.  Pt entered/exited the pool via chair lift.                 *Seated on chair lift: cycling; add/abd, LAQ with DF 20 reps x 3, ankle ROM               * walking forward and backward 4.49ft, facing lap pool, UE support yellow noodle. Gait training with forward amb cues for heel strike and toe off as able.               *side stepping ue support yellow noodle.               *ue support yellow noodle:  hip  add/abd; flex/extension; hip circles as tolerated cw&ccw; df; PF    *yellow noodle wrapped anteriorly under waist,: lumbar rotation; prone hands on wall kicking, ant core stretching; XX skiing                      Pt requires the buoyancy and hydrostatic pressure of water for support, and to offload joints by unweighting joint load by at least 50 % in navel deep water and by at least 75-80% in chest to neck deep water.  Viscosity of the water is needed for resistance of strengthening. Water current perturbations provides challenge to standing balance requiring increased core activatio   Wanda Lang EDUCATION:  Education details: aquatics modifications/ progressions  Person educated: Wanda Lang Education method: Explanation Education comprehension: verbalized  understanding and returned demonstration  HOME EXERCISE PROGRAM: Access Code: [email or web address] URL: [email or web address]   ASSESSMENT:  CLINICAL IMPRESSION:   Surgical date is 05/15/22   Pt reports she is getting in and out of car with more steadiness placing the fww into back seat.  She does ask for assistance whenever it is available. She shows improvement with standing balance completing le exercise supported by noodle rather than pool wall. She is able to negotiate steps entering pool requiring cga for safety but demonstrating improving strength in Le's. Goals ongoing    PN: Wanda Lang presents today for progress note.  Upon looking at her x-ray and hearing symptoms, she is not appropriate for land-based therapy until after her hip replacement surgery.  Attempting to strengthen via land-based creates too high of a risk for the Wanda Lang.  She is having her hip replacement done on April 19 and will continue with aquatic exercises in skilled PT on a weekly basis to monitor and reports she will be able to go to the [facility] at least 1 more day of the week.  Plan of care extended to cover this timeframe. We did discuss exercises on land versus  in the pool and the importance of maintaining strength, flexibility, and mobility in order to improve outcomes following surgery. Wanda Lang reports there are plans for her to go to a skilled nursing facility following surgery as she lives on her own and I encouraged her to obtain a referral to continue with outpatient physical therapy so that we can schedule her as soon as we know she will be discharged from the nursing facility.   OBJECTIVE IMPAIRMENTS: Abnormal gait, decreased activity tolerance, decreased balance, decreased endurance, decreased mobility, difficulty walking, decreased ROM, decreased strength, decreased safety awareness, increased muscle spasms, impaired flexibility, improper body mechanics, postural dysfunction, obesity, and pain.   ACTIVITY LIMITATIONS: carrying, lifting, bending, sitting, standing, squatting, sleeping, stairs, transfers, bed mobility, and locomotion level  PARTICIPATION LIMITATIONS: meal prep, cleaning, laundry, driving, shopping, community activity, occupation, and yard work  PERSONAL FACTORS: Age, Fitness, Past/current experiences, Time since onset of injury/illness/exacerbation, and 1-2 comorbidities: obesity, leg length difference  are also affecting Wanda Lang's functional outcome.   REHAB POTENTIAL: Good  CLINICAL DECISION MAKING: Evolving/moderate complexity  EVALUATION COMPLEXITY: Moderate   GOALS: Goals reviewed with Wanda Lang? Yes  SHORT TERM GOALS: Target date: 03/22/22  Pt will tolerate aquatic therapy sessions without increase in pain to demonstrate positive response to intervention Baseline:TBA Goal status: Met 03/20/22  2.  Pt will tolerate driving without increase in pain Baseline: barely tolerable Goal status: Met 03/20/22  3.  Pt will be able to move right foot on and off of car pedals with ease. Baseline: much difficulty and pain Goal status: Met 03/20/22  4.  Pt will improve rle hip flex and abd strength to 3+/5 or > to demonstrate  the safe ability to leverage RLE with all transfers with improved safety. Baseline: 3- Goal status: deferred due to pending surgical intervention 04/25/22  5.  Pt to be indep and safe with putting fww into car then getting into drivers side Baseline: "worse thing I do very unsteady" Goal status: ongoing 03/20/22; Met 04/25/22  6.  Pt will use stairs into and out of pool to improve strength with completion improving safety with stairs at home. Baseline:  Goal status: ongoing 03/20/22  LONG TERM GOALS: Target date: 04/26/22  Foto goal of 51 will be met to demonstrate pt improved perception of her functional mobility Baseline: 26% Goal status:  INITIAL  2.  Pt to decrease max pain to <or=4/10 to demonstrate improvement in management of condition. Baseline: 8/10 Goal status: INITIAL  3.  Pt will be able to lift her rle into bed without UE assistance demonstrating an improvement in strength Baseline: UE lifts rle into bed Goal status: INITIAL  4.  Pt will be indep with final HEP (land and/or aquatic as indicated) for long term management of condition Baseline: basic Sup exercises Goal status: INITIAL  5.  Pt will improve strength in all weak areas up at least 1 full grade to improve functional mobility and ADL's Baseline: see chart   PLAN:  PT FREQUENCY: 1-2x/week  PT DURATION: 10 weeks  PLANNED INTERVENTIONS: Therapeutic exercises, Therapeutic activity, Neuromuscular re-education, Balance training, Gait training, Wanda Lang/Family education, Self Care, Joint mobilization, Joint manipulation, Stair training, Orthotic/Fit training, DME instructions, Aquatic Therapy, Dry Needling, Electrical stimulation, Cryotherapy, Moist heat, scar mobilization, Splintting, Taping, Ultrasound, Parrafin, Biofeedback, Ionotophoresis 4mg /ml Dexamethasone, Manual therapy, and Re-evaluation.  PLAN FOR NEXT SESSION: Aquatics for strengthening LE/core; gait and balance retraining; stair climbing, aerobic  capacity improvement; Pt edu/training, sup <>sit  Stanton Kidney Tharon Aquas) Demontrez Rindfleisch MPT 04/25/22 4:43 PM    Referring diagnosis? R hip pain Treatment diagnosis? (if different than referring diagnosis) right hip pain What was this (referring dx) caused by? []  Surgery []  Fall [x]  Ongoing issue [x]  Arthritis []  Other: ____________  Laterality: []  Rt []  Lt [x]  Both  Check all possible CPT codes:  *CHOOSE 10 OR LESS*    [x]  97110 (Therapeutic Exercise)  []  92507 (SLP Treatment)  []  97112 (Neuro Re-ed)   []  92526 (Swallowing Treatment)   [x]  97116 (Gait Training)   []  [id number] (Cognitive Training, 1st 15 minutes) [x]  97140 (Manual Therapy)   []  97130 (Cognitive Training, each add'l 15 minutes)  [x]  97164 (Re-evaluation)                              []  Other, List CPT Code ____________  [x]  97530 (Therapeutic Activities)     [x]  97535 (Self Care)   []  All codes above (97110 - 97535)  []  97012 (Mechanical Traction)  [x]  97014 (E-stim Unattended)  [x]  97032 (E-stim manual)  [x]  97033 (Ionto)  [x]  97035 (Ultrasound) [x]  97750 (Physical Performance Training) [x]  [id number] (Aquatic Therapy) []  97016 (Vasopneumatic Device) []  [id number] (Paraffin) []  97034 (Contrast Bath) []  97597 (Wound Care 1st 20 sq cm) []  97598 (Wound Care each add'l 20 sq cm) [x]  97760 (Orthotic Fabrication, Fitting, Training Initial) []  [id number] (Prosthetic Management and Training Initial) []  [id number] (Orthotic or Prosthetic Training/ Modification Subsequent)

## 2022-04-26 IMAGING — DX DG CHEST 1V
1 series · 1 of 1 positions shown · non-contrast
Comparison: None.

CLINICAL DATA: Cough

EXAM:
CHEST  1 VIEW

[chest ap]
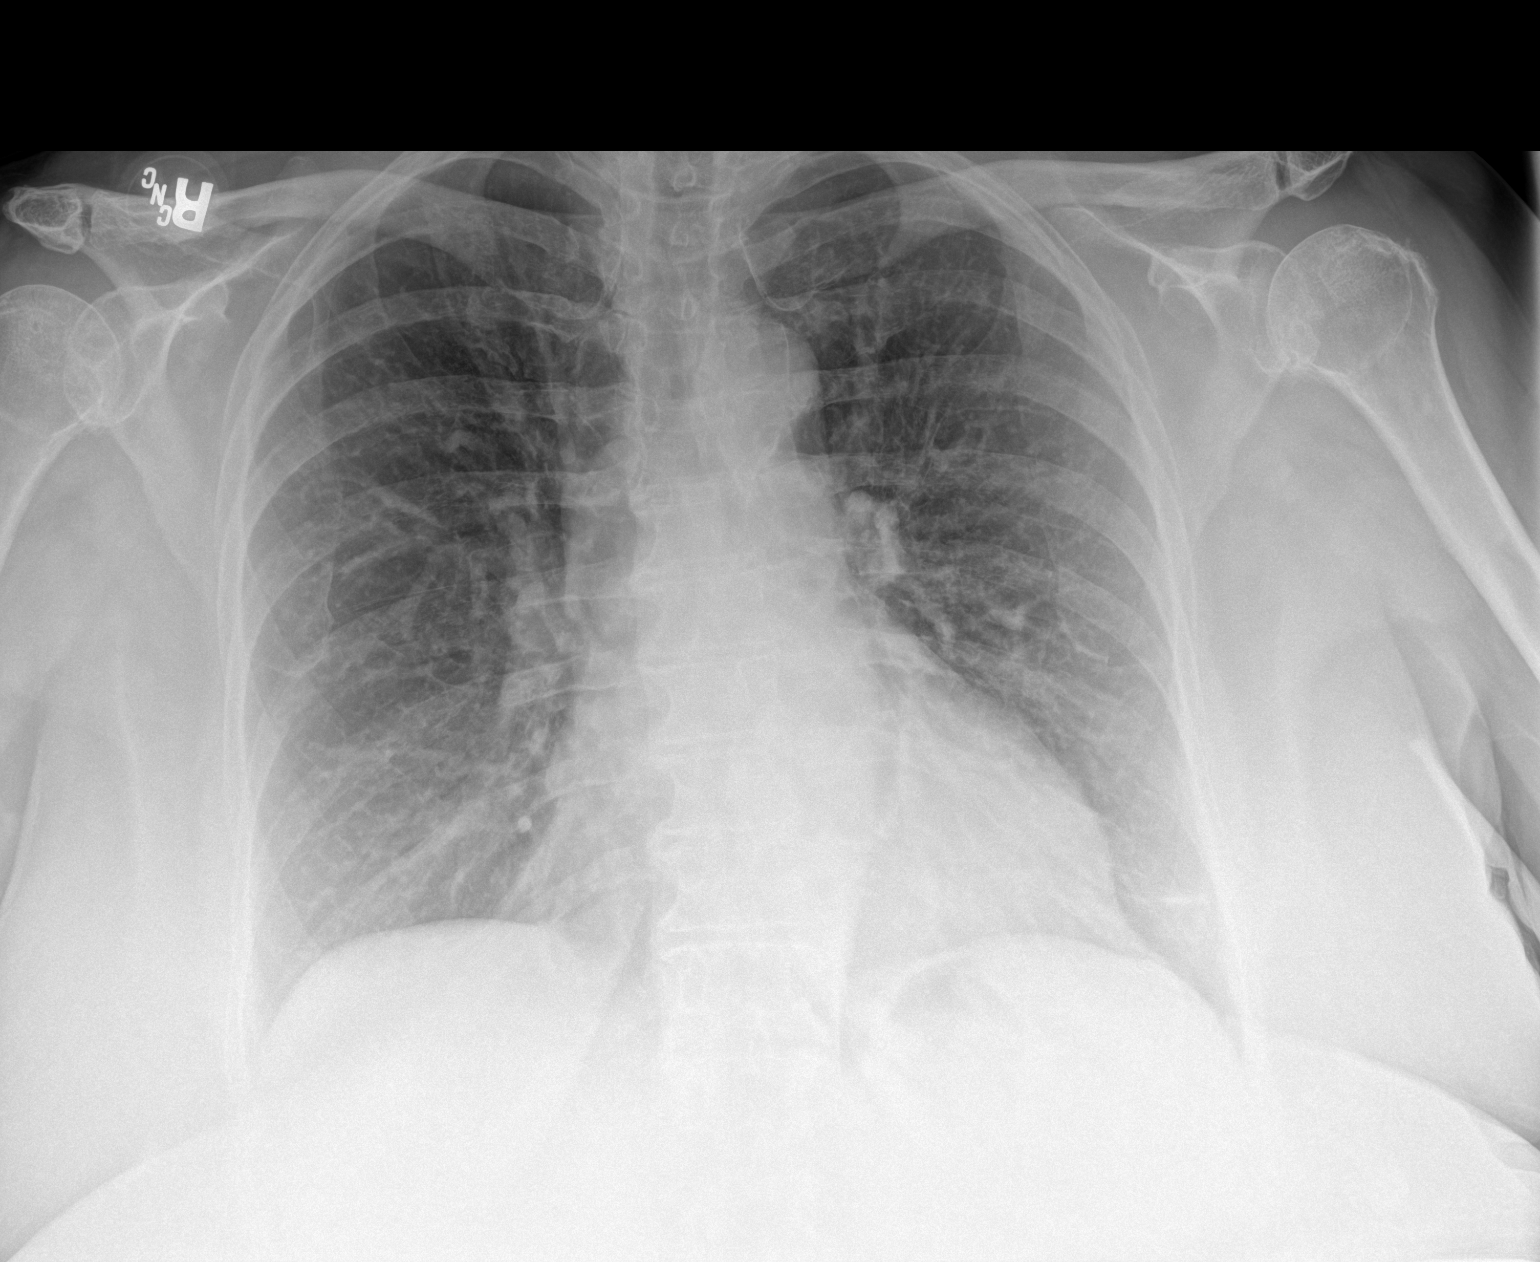

[1 of 1 positions shown; findings below may reference images not displayed]

FINDINGS: Cardiac and mediastinal contours are within normal limits for AP
technique. Mild scattered linear opacities which are likely due to
atelectasis. No focal consolidation. No large pleural effusion or
pneumothorax.
IMPRESSION: No active disease.

## 2022-04-28 ENCOUNTER — Encounter: Payer: Self-pay | Admitting: Internal Medicine

## 2022-04-30 ENCOUNTER — Encounter (HOSPITAL_BASED_OUTPATIENT_CLINIC_OR_DEPARTMENT_OTHER): Payer: Self-pay | Admitting: Physical Therapy

## 2022-04-30 ENCOUNTER — Ambulatory Visit (HOSPITAL_BASED_OUTPATIENT_CLINIC_OR_DEPARTMENT_OTHER): Payer: Medicare PPO | Admitting: Physical Therapy

## 2022-04-30 DIAGNOSIS — R2689 Other abnormalities of gait and mobility: Secondary | ICD-10-CM

## 2022-04-30 DIAGNOSIS — M25551 Pain in right hip: Secondary | ICD-10-CM

## 2022-04-30 DIAGNOSIS — M5459 Other low back pain: Secondary | ICD-10-CM

## 2022-04-30 DIAGNOSIS — M6281 Muscle weakness (generalized): Secondary | ICD-10-CM

## 2022-04-30 NOTE — Therapy (Signed)
OUTPATIENT PHYSICAL THERAPY THORACOLUMBAR TREATMENT   Patient Name: Wanda Lang MRN: [id number] DOB:12-21-1950, 72 y.o., female Today's Date: 04/30/2022  END OF SESSION:  PT End of Session - 04/30/22 1728     Visit Number 13    Number of Visits 20    Date for PT Re-Evaluation 05/14/22    Authorization Type human MCR    Progress Note Due on Visit 20    PT Start Time 1716    PT Stop Time 1800    PT Time Calculation (min) 44 min    Activity Tolerance Patient tolerated treatment well    Behavior During Therapy WFL for tasks assessed/performed              Past Medical History:  Diagnosis Date   Allergy    Arthritis    Chronic kidney disease    Coronary artery disease    GERD (gastroesophageal reflux disease)    Heart murmur    Hypertension    LIVER FUNCTION TESTS, ABNORMAL, HX OF 09/08/2007   Qualifier: Diagnosis of  By: Harlon Ditty CMA (AAMA), Dottie     Past Surgical History:  Procedure Laterality Date   ABDOMINAL HYSTERECTOMY     COLONOSCOPY WITH PROPOFOL N/A 09/04/2018   Procedure: COLONOSCOPY WITH PROPOFOL;  Surgeon: Virgel Manifold, MD;  Location: [facility];  Service: Endoscopy;  Laterality: N/A;   ESOPHAGOGASTRODUODENOSCOPY (EGD) WITH PROPOFOL N/A 09/04/2018   Procedure: ESOPHAGOGASTRODUODENOSCOPY (EGD) WITH PROPOFOL;  Surgeon: Virgel Manifold, MD;  Location: [facility] END[facility]COPY;  Service: Endoscopy;  Laterality: N/A;   LAPAROSCOPY     TONSILLECTOMY     TOTAL KNEE ARTHROPLASTY Left 07/18/2021   Procedure: TOTAL KNEE ARTHROPLASTY;  Surgeon: Paralee Cancel, MD;  Location: [facility];  Service: Orthopedics;  Laterality: Left;   TURBINATE REDUCTION     Patient Active Problem List   Diagnosis Date Noted   Overactive bladder 04/04/2022   Unilateral primary osteoarthritis, right hip 03/20/2022   Dysuria 01/07/2022   Postmenopausal estrogen deficiency 11/13/2021   Leg length inequality 11/06/2021   Impaired ambulation 10/23/2021   Contact dermatitis  10/16/2021   SI joint arthritis 08/24/2021   Spinal stenosis, lumbar region, without neurogenic claudication 08/24/2021   Degeneration of lumbar intervertebral disc 07/24/2021   Stiffness of left knee 07/21/2021   S/P total knee arthroplasty, left 07/18/2021   Preop examination 07/07/2021   Right hip pain 07/07/2021   Low back pain 07/07/2021   Drug-induced immunodeficiency ([place]) 06/12/2021   Impingement syndrome of right shoulder region 06/12/2021   Preoperative evaluation of a medical condition to rule out surgical contraindications (TAR required) 06/08/2021   COVID-19 01/24/2021   Hammer toe 04/22/2020   Metatarsalgia of left foot 04/22/2020   Osteoarthritis of midfoot 04/22/2020   Duodenal ulcer 11/12/2019   Stress 11/12/2019   Morbid obesity ([place]) 11/06/2019   Pain in left knee 07/30/2019   Aortic stenosis, moderate 02/06/2018   Maxillary sinusitis, chronic 10/24/2017   Chronic diarrhea 10/09/2017   Acid reflux 07/15/2017   Asymptomatic carotid artery stenosis 07/15/2017   Cyst of skin 07/15/2017   Immunocompromised ([place]) 11/09/2016   Allergic rhinitis 11/02/2016   Benign essential tremor 11/02/2016   History of hypertension 11/02/2016   Postmenopausal symptoms 07/01/2015   Seasonal allergic rhinitis due to pollen 07/01/2015   Fatty liver 09/08/2007   Rheumatoid arthritis ([place]) 09/08/2007    PCP: Leone Haven, MD   REFERRING PROVIDER: Charlett Blake, MD     REFERRING DIAG: [phone] (ICD-10-CM) - Right hip  pain   Rationale for Evaluation and Treatment: Rehabilitation  THERAPY DIAG:  Pain in right hip  Other low back pain  Muscle weakness (generalized)  Other abnormalities of gait and mobility  ONSET DATE: ~ 6 months  SUBJECTIVE:                                                                                                                                                                                     SUBJECTIVE STATEMENT: "Counting down  days until hip surgery."  PAIN:  Are you having pain? Yes: NPRS scale: currently 4-5/10  worst 8/10 Pain location: Right SI through posterior and anterior hip Pain description: sharp quality to ache Aggravating factors: weight bearing on RLE Relieving factors: Oral meds,lidocaine patches, TENS sitting, lying down  PRECAUTIONS: Fall  WEIGHT BEARING RESTRICTIONS: No  FALLS:  Has patient fallen in last 6 months? No  LIVING ENVIRONMENT: Lives with: lives alone Lives in: House/apartment Stairs:  2 steps into sunk in living room to kitchen and bathroom.  Has a hand rail Has following equipment at home: Gilford Rile - 2 wheeled  OCCUPATION: retired Pharmacist, hospital  PLOF: Independent with household mobility with device  PATIENT GOALS: walk better, decrease pain, be able to transfer (car, into bed) better, get around home safer  NEXT MD VISIT: Feb Dr Letta Pate  OBJECTIVE:   DIAGNOSTIC FINDINGS:   IMPRESSION: x-ray 02/24/22 1. Osteolysis of the right femoral head which has significantly progressed compared with 06/16/2019 and severe right hip joint space narrowing as can be seen with rapidly progressive osteoarthritis. Septic arthritis is considered much less likely.   MRI lumbar spine 10/23/21 IMPRESSION: 1. Widespread lumbar spine degeneration with no acute osseous abnormality. Mild scoliosis and multilevel spondylolisthesis   2. Mild to moderate multifactorial spinal stenosis at L2-L3. Mild left lateral recess stenosis at L1-L2 and L4-L5. Up to moderate neural foraminal stenosis at the left L2, right L3, left L4 and left L5 nerve levels.   3. Partially visible distended gallbladder. Query right upper quadrant pain.  PATIENT SURVEYS:  FOTO 26% with goal of 51 at visit 15 03/20/22 21%   COGNITION: Overall cognitive status: Within functional limits for tasks assessed     SENSATION: WFL  MUSCLE LENGTH: Hamstrings: wfl   POSTURE: rounded shoulders, weight shift left, and right  hip slightly higher than left  PALPATION: Right hip At gr trochanter TTP  LUMBAR ROM:   Limited with side bending Increased hip pain  LOWER EXTREMITY ROM:     Active  Right eval Left eval  Hip flexion 40d active 90 passive 110 active  Hip extension    Hip abduction    Hip adduction  Hip internal rotation 20   Hip external rotation 30   Knee flexion 110 120  Knee extension 0 0 stiff  Ankle dorsiflexion wfl wfl  Ankle plantarflexion    Ankle inversion    Ankle eversion     (Blank rows = not tested)  LOWER EXTREMITY MMT:    MMT Right eval Left eval  Hip flexion 3- 3+  Hip extension    Hip abduction 3- 5  Hip adduction 4 5  Hip internal rotation    Hip external rotation    Knee flexion 5 4+  Knee extension 5 4+  Ankle dorsiflexion    Ankle plantarflexion    Ankle inversion    Ankle eversion     (Blank rows = not tested)  LUMBAR SPECIAL TESTS:  Marcello Moores test: Negative  FUNCTIONAL TESTS:  TBA at later date as tolerated  GAIT: Distance walked: 457ft Assistive device utilized: Environmental consultant - 2 wheeled Level of assistance: Modified independence Comments: decreased stance time lle, right leaning, lack of right hip flex just clearing right foot from floor. Cadence slowed and antalgic  TODAY'S TREATMENT:                                                                                                                              Pt seen for aquatic therapy today.  Treatment took place in water 3.5-4+ ft in depth at the [facility] pool. Temp of water was 91.  Pt entered/exited the pool via stairs using step to gait and hand rail and cga-sba     *stair climbing instruction               *Seated on chair lift: cycling; add/abd, LAQ with DF 20 reps x 3, ankle ROM               * walking forward and backward 4.34ft, facing lap pool, UE support yellow noodle. Gait training with forward amb cues for heel strike and toe off as able.               *side stepping ue  support yellow noodle.               *ue support yellow noodle:  hip add/abd; flex/extension; df; PF    *yellow noodle wrapped anteriorly under waist, prone hands on wall kicking, ant core stretching; XX skiing                      Pt requires the buoyancy and hydrostatic pressure of water for support, and to offload joints by unweighting joint load by at least 50 % in navel deep water and by at least 75-80% in chest to neck deep water.  Viscosity of the water is needed for resistance of strengthening. Water current perturbations provides challenge to standing balance requiring increased core activatio   PATIENT EDUCATION:  Education details: aquatics modifications/ progressions  Person educated: Patient Education method: Explanation Education comprehension:  verbalized understanding and returned demonstration  HOME EXERCISE PROGRAM: Access Code: [email or web address] URL: [email or web address]   ASSESSMENT:  CLINICAL IMPRESSION:   Surgical date is 05/15/22   Pt with visually improved r hip abd as demonstrated with side stepping today.  Good SLS R and Left with submersion in 4.44ft. No pain with session, low pain initially. Pt uses stairs to enter and exit pool goal is met. Her max pain is down slightly from initial assessment 7/10 from 8/10. She reports she is getting around easier although driving has become more difficult. Discussed need for aquatic HEP. For now the decision is not to issue as pt does not have plans to gain access to pool.  In event after surgery she does she is instructed to reach out and we will create.  DC planning with 2 more visits. Goals ongoing      PN: Patient presents today for progress note.  Upon looking at her x-ray and hearing symptoms, she is not appropriate for land-based therapy until after her hip replacement surgery.  Attempting to strengthen via land-based creates too high of a risk for the patient.  She is having her hip replacement done on April 19  and will continue with aquatic exercises in skilled PT on a weekly basis to monitor and reports she will be able to go to the [facility] at least 1 more day of the week.  Plan of care extended to cover this timeframe. We did discuss exercises on land versus in the pool and the importance of maintaining strength, flexibility, and mobility in order to improve outcomes following surgery. Patient reports there are plans for her to go to a skilled nursing facility following surgery as she lives on her own and I encouraged her to obtain a referral to continue with outpatient physical therapy so that we can schedule her as soon as we know she will be discharged from the nursing facility.   OBJECTIVE IMPAIRMENTS: Abnormal gait, decreased activity tolerance, decreased balance, decreased endurance, decreased mobility, difficulty walking, decreased ROM, decreased strength, decreased safety awareness, increased muscle spasms, impaired flexibility, improper body mechanics, postural dysfunction, obesity, and pain.   ACTIVITY LIMITATIONS: carrying, lifting, bending, sitting, standing, squatting, sleeping, stairs, transfers, bed mobility, and locomotion level  PARTICIPATION LIMITATIONS: meal prep, cleaning, laundry, driving, shopping, community activity, occupation, and yard work  PERSONAL FACTORS: Age, Fitness, Past/current experiences, Time since onset of injury/illness/exacerbation, and 1-2 comorbidities: obesity, leg length difference  are also affecting patient's functional outcome.   REHAB POTENTIAL: Good  CLINICAL DECISION MAKING: Evolving/moderate complexity  EVALUATION COMPLEXITY: Moderate   GOALS: Goals reviewed with patient? Yes  SHORT TERM GOALS: Target date: 03/22/22  Pt will tolerate aquatic therapy sessions without increase in pain to demonstrate positive response to intervention Baseline:TBA Goal status: Met 03/20/22  2.  Pt will tolerate driving without increase in pain Baseline: barely  tolerable Goal status: Met 03/20/22  3.  Pt will be able to move right foot on and off of car pedals with ease. Baseline: much difficulty and pain Goal status: Met 03/20/22  4.  Pt will improve rle hip flex and abd strength to 3+/5 or > to demonstrate the safe ability to leverage RLE with all transfers with improved safety. Baseline: 3- Goal status: deferred due to pending surgical intervention 04/25/22  5.  Pt to be indep and safe with putting fww into car then getting into drivers side Baseline: "worse thing I do very unsteady" Goal status: ongoing 03/20/22; Met 04/25/22  6.  Pt will use stairs into and out of pool to improve strength with completion improving safety with stairs at home. Baseline:  Goal status: ongoing 03/20/22. Met 04/30/22  LONG TERM GOALS: Target date: 04/26/22  Foto goal of 51 will be met to demonstrate pt improved perception of her functional mobility Baseline: 26% Goal status: INITIAL  2.  Pt to decrease max pain to <or=4/10 to demonstrate improvement in management of condition. Baseline: 8/10 Goal status: INITIAL  3.  Pt will be able to lift her rle into bed without UE assistance demonstrating an improvement in strength Baseline: UE lifts rle into bed Goal status: deferred.  Pt has been sleeping in recliner 04/30/22  4.  Pt will be indep with final HEP (land and/or aquatic as indicated) for long term management of condition Baseline: basic Sup exercises Goal status: INITIAL  5.  Pt will improve strength in all weak areas up at least 1 full grade to improve functional mobility and ADL's Baseline: see chart   PLAN:  PT FREQUENCY: 1-2x/week  PT DURATION: 10 weeks  PLANNED INTERVENTIONS: Therapeutic exercises, Therapeutic activity, Neuromuscular re-education, Balance training, Gait training, Patient/Family education, Self Care, Joint mobilization, Joint manipulation, Stair training, Orthotic/Fit training, DME instructions, Aquatic Therapy, Dry Needling,  Electrical stimulation, Cryotherapy, Moist heat, scar mobilization, Splintting, Taping, Ultrasound, Parrafin, Biofeedback, Ionotophoresis 4mg /ml Dexamethasone, Manual therapy, and Re-evaluation.  PLAN FOR NEXT SESSION: Aquatics for strengthening LE/core; gait and balance retraining; stair climbing, aerobic capacity improvement; Pt edu/training, sup <>sit  Stanton Kidney Tharon Aquas) Rivka Baune MPT 04/30/22 5:30 PM    Referring diagnosis? R hip pain Treatment diagnosis? (if different than referring diagnosis) right hip pain What was this (referring dx) caused by? []  Surgery []  Fall [x]  Ongoing issue [x]  Arthritis []  Other: ____________  Laterality: []  Rt []  Lt [x]  Both  Check all possible CPT codes:  *CHOOSE 10 OR LESS*    [x]  97110 (Therapeutic Exercise)  []  92507 (SLP Treatment)  []  97112 (Neuro Re-ed)   []  92526 (Swallowing Treatment)   [x]  [id number] (Gait Training)   []  [id number] (Cognitive Training, 1st 15 minutes) [x]  97140 (Manual Therapy)   []  97130 (Cognitive Training, each add'l 15 minutes)  [x]  97164 (Re-evaluation)                              []  Other, List CPT Code ____________  [x]  96295 (Therapeutic Activities)     [x]  97535 (Self Care)   []  All codes above (97110 - 97535)  []  97012 (Mechanical Traction)  [x]  97014 (E-stim Unattended)  [x]  97032 (E-stim manual)  [x]  97033 (Ionto)  [x]  97035 (Ultrasound) [x]  97750 (Physical Performance Training) [x]  [id number] (Aquatic Therapy) []  97016 (Vasopneumatic Device) []  97018 (Paraffin) []  97034 (Contrast Bath) []  97597 (Wound Care 1st 20 sq cm) []  [id number] (Wound Care each add'l 20 sq cm) [x]  97760 (Orthotic Fabrication, Fitting, Training Initial) []  [id number] (Prosthetic Management and Training Initial) []  [id number] (Orthotic or Prosthetic Training/ Modification Subsequent)

## 2022-05-02 NOTE — Pre-Procedure Instructions (Signed)
Surgical Instructions    Your procedure is scheduled on May 15, 2022  Report to [facility] Main Entrance "A" at 7: 30 A.M., then check in with the Admitting office.  Call this number if you have problems the morning of surgery:  [phone]  If you have any questions prior to your surgery date call [phone]: Open Monday-Friday 8am-4pm If you experience any cold or flu symptoms such as cough, fever, chills, shortness of breath, etc. between now and your scheduled surgery, please notify us at the above number.     Remember:  Do not eat after midnight the night before your surgery  You may drink clear liquids until 6:30 am the morning of your surgery.   Clear liquids allowed are: Water, Non-Citrus Juices (without pulp), Carbonated Beverages, Clear Tea, Black Coffee Only (NO MILK, CREAM OR POWDERED CREAMER of any kind), and Gatorade.  Patient Instructions  The night before surgery:  No food after midnight. ONLY clear liquids after midnight  The day of surgery (if you do NOT have diabetes):  Drink ONE (1) Pre-Surgery Clear Ensure by 6:30 am the morning of surgery. Drink in one sitting. Do not sip.  This drink was given to you during your hospital  pre-op appointment visit.  Nothing else to drink after completing the  Pre-Surgery Clear Ensure.      Take these medicines the morning of surgery with A SIP OF WATER   acetaminophen (TYLENOL) as needed  Docusate Sodium (DSS)  methocarbamol (ROBAXIN) as needed oxyCODONE (OXY IR/ROXICODONE) as needed solifenacin (VESICARE) 5 MG tablet     As of today, STOP taking any Aspirin (unless otherwise instructed by your surgeon) Aleve, Naproxen, Ibuprofen, Motrin, Advil, Goody's, BC's, all herbal medications, fish oil, and all vitamins.                     Do NOT Smoke (Tobacco/Vaping) for 24 hours prior to your procedure.  If you use a CPAP at night, you may bring your mask/headgear for your overnight stay.   Contacts, glasses,  piercing's, hearing aid's, dentures or partials may not be worn into surgery, please bring cases for these belongings.    For patients admitted to the hospital, discharge time will be determined by your treatment team.   Patients discharged the day of surgery will not be allowed to drive home, and someone needs to stay with them for 24 hours.  SURGICAL WAITING ROOM VISITATION Patients having surgery or a procedure may have no more than 2 support people in the waiting area - these visitors may rotate.   Children under the age of 71 must have an adult with them who is not the patient. If the patient needs to stay at the hospital during part of their recovery, the visitor guidelines for inpatient rooms apply. Pre-op nurse will coordinate an appropriate time for 1 support person to accompany patient in pre-op.  This support person may not rotate.   Please refer to the [facility] website for the visitor guidelines for Inpatients (after your surgery is over and you are in a regular room).    Special instructions:   [place]- Preparing For Surgery  Before surgery, you can play an important role. Because skin is not sterile, your skin needs to be as free of germs as possible. You can reduce the number of germs on your skin by washing with CHG (chlorahexidine gluconate) Soap before surgery.  CHG is an antiseptic cleaner which kills germs and bonds with  the skin to continue killing germs even after washing.    Oral Hygiene is also important to reduce your risk of infection.  Remember - BRUSH YOUR TEETH THE MORNING OF SURGERY WITH YOUR REGULAR TOOTHPASTE  Please do not use if you have an allergy to CHG or antibacterial soaps. If your skin becomes reddened/irritated stop using the CHG.  Do not shave (including legs and underarms) for at least 48 hours prior to first CHG shower. It is OK to shave your face.  Please follow these instructions carefully.   Shower the NIGHT BEFORE SURGERY and the  MORNING OF SURGERY  If you chose to wash your hair, wash your hair first as usual with your normal shampoo.  After you shampoo, rinse your hair and body thoroughly to remove the shampoo.  Use CHG Soap as you would any other liquid soap. You can apply CHG directly to the skin and wash gently with a scrungie or a clean washcloth.   Apply the CHG Soap to your body ONLY FROM THE NECK DOWN.  Do not use on open wounds or open sores. Avoid contact with your eyes, ears, mouth and genitals (private parts). Wash Face and genitals (private parts)  with your normal soap.   Wash thoroughly, paying special attention to the area where your surgery will be performed.  Thoroughly rinse your body with warm water from the neck down.  DO NOT shower/wash with your normal soap after using and rinsing off the CHG Soap.  Pat yourself dry with a CLEAN TOWEL.  Wear CLEAN PAJAMAS to bed the night before surgery  Place CLEAN SHEETS on your bed the night before your surgery  DO NOT SLEEP WITH PETS.   Day of Surgery: Take a shower with CHG soap. Do not wear jewelry or makeup Do not wear lotions, powders, perfumes/colognes, or deodorant. Do not shave 48 hours prior to surgery.  Men may shave face and neck. Do not bring valuables to the hospital.  [facility] is not responsible for any belongings or valuables. Do not wear nail polish, gel polish, artificial nails, or any other type of covering on natural nails (fingers and toes) If you have artificial nails or gel coating that need to be removed by a nail salon, please have this removed prior to surgery. Artificial nails or gel coating may interfere with anesthesia's ability to adequately monitor your vital signs. Wear Clean/Comfortable clothing the morning of surgery Remember to brush your teeth WITH YOUR REGULAR TOOTHPASTE.   Please read over the following fact sheets that you were given.    If you received a COVID test during your pre-op visit  it is  requested that you wear a mask when out in public, stay away from anyone that may not be feeling well and notify your surgeon if you develop symptoms. If you have been in contact with anyone that has tested positive in the last 10 days please notify you surgeon.

## 2022-05-03 ENCOUNTER — Other Ambulatory Visit: Payer: Self-pay

## 2022-05-03 ENCOUNTER — Encounter (HOSPITAL_COMMUNITY): Payer: Self-pay

## 2022-05-03 ENCOUNTER — Encounter (HOSPITAL_COMMUNITY)
Admission: RE | Admit: 2022-05-03 | Discharge: 2022-05-03 | Disposition: A | Discharge status: Home / Self Care | Payer: Medicare PPO | Source: Ambulatory Visit | Attending: Orthopaedic Surgery | Admitting: Orthopaedic Surgery

## 2022-05-03 VITALS — BP 121/61 | HR 73 | Temp 97.6°F | Resp 18 | Ht 64.0 in | Wt 194.6 lb

## 2022-05-03 DIAGNOSIS — Z01818 Encounter for other preprocedural examination: Secondary | ICD-10-CM | POA: Diagnosis not present

## 2022-05-03 DIAGNOSIS — I2510 Atherosclerotic heart disease of native coronary artery without angina pectoris: Secondary | ICD-10-CM | POA: Diagnosis not present

## 2022-05-03 HISTORY — DX: Fatty (change of) liver, not elsewhere classified: K76.0

## 2022-05-03 HISTORY — DX: Myoneural disorder, unspecified: G70.9

## 2022-05-03 LAB — COMPREHENSIVE METABOLIC PANEL
ALT: 8 U/L (ref 0–44)
AST: 15 U/L (ref 15–41)
Albumin: 3.8 g/dL (ref 3.5–5.0)
Alkaline Phosphatase: 59 U/L (ref 38–126)
Anion gap: 10 (ref 5–15)
BUN: 23 mg/dL (ref 8–23)
CO2: 28 mmol/L (ref 22–32)
Calcium: 9.3 mg/dL (ref 8.9–10.3)
Chloride: 102 mmol/L (ref 98–111)
Creatinine, Ser: 0.75 mg/dL (ref 0.44–1.00)
GFR, Estimated: >60 mL/min (ref >60)
Glucose, Bld: 84 mg/dL (ref 70–99)
Potassium: 4 mmol/L (ref 3.5–5.1)
Sodium: 140 mmol/L (ref 135–145)
Total Bilirubin: 0.7 mg/dL (ref 0.3–1.2)
Total Protein: 6.2 g/dL — ABNORMAL LOW (ref 6.5–8.1)

## 2022-05-03 LAB — CBC
HCT: 39.4 % (ref 36.0–46.0)
Hemoglobin: 13 g/dL (ref 12.0–15.0)
MCH: 30.3 pg (ref 26.0–34.0)
MCHC: 33 g/dL (ref 30.0–36.0)
MCV: 91.8 fL (ref 80.0–100.0)
Platelets: 138 10*3/uL — ABNORMAL LOW (ref 150–400)
RBC: 4.29 MIL/uL (ref 3.87–5.11)
RDW: 12.5 % (ref 11.5–15.5)
WBC: 5.1 10*3/uL (ref 4.0–10.5)
nRBC: 0 % (ref 0.0–0.2)

## 2022-05-03 LAB — SURGICAL PCR SCREEN
MRSA, PCR: NEGATIVE
Staphylococcus aureus: POSITIVE — AB

## 2022-05-03 LAB — TYPE AND SCREEN
ABO/RH(D): O POS
Antibody Screen: NEGATIVE

## 2022-05-03 NOTE — Progress Notes (Signed)
PCP - Dr. [name] Cardiologist - Dr. [name] Rheumatologist - Dr. Dossie Der  PPM/ICD - Denies Device Orders - n/a Rep Notified - n/a  Chest x-ray - n/a EKG - 05/03/2022 Stress Test - Denies ECHO - 07/13/2020 Cardiac Cath - Denies  Sleep Study - Denies CPAP - n/a  No DM  Last dose of GLP1 agonist- n/a GLP1 instructions: n/a  Blood Thinner Instructions: n/a Aspirin Instructions: n/a  ERAS Protcol - Clear liquids until 0630 morning of surgery PRE-SURGERY Ensure or G2- Ensure given to pt with instructions  COVID TEST- n/a   Anesthesia review: Yes. Pt is pending cardiac clearance. Her appointment with Dr. Saralyn Pilar is April 3rd. She has already received medical clearance and clearance from her Rheumatologist.   Patient denies shortness of breath, fever, cough and chest pain at PAT appointment. Pt denies any respiratory illness/infection in the last two months.   All instructions explained to the patient, with a verbal understanding of the material. Patient agrees to go over the instructions while at home for a better understanding. Patient also instructed to self quarantine after being tested for COVID-19. The opportunity to ask questions was provided.

## 2022-05-06 NOTE — Therapy (Signed)
OUTPATIENT PHYSICAL THERAPY THORACOLUMBAR TREATMENT PHYSICAL THERAPY DISCHARGE SUMMARY  Visits from Start of Care: 13  Current functional level related to goals / functional outcomes: Pt is safe and indep with ADL's and functional mobility using AD's.   Remaining deficits: Pain and osteolysis left hip   Education / Equipment: Management of condition/HEP   Patient agrees to discharge. Patient goals were partially met. Patient is being discharged due to  scheduled THR.   Patient Name: Wanda Lang MRN: [id number] DOB:Jun 11, 1950, 72 y.o., female Today's Date: 05/07/2022  END OF SESSION:  PT End of Session - 05/07/22 1124     Visit Number 14    Number of Visits 20    Date for PT Re-Evaluation 05/14/22    Authorization Type human MCR    Progress Note Due on Visit 20    PT Start Time 1116    PT Stop Time 1200    PT Time Calculation (min) 44 min    Activity Tolerance Patient tolerated treatment well    Behavior During Therapy WFL for tasks assessed/performed              Past Medical History:  Diagnosis Date   Allergy    Arthritis    Coronary artery disease    Fatty liver    GERD (gastroesophageal reflux disease)    Heart murmur    Hypertension    Hx   LIVER FUNCTION TESTS, ABNORMAL, HX OF 09/08/2007   Qualifier: Diagnosis of  By: Candice Camp CMA (AAMA), Dottie     Neuromuscular disorder    Essential Tremor   Past Surgical History:  Procedure Laterality Date   ABDOMINAL HYSTERECTOMY     COLONOSCOPY WITH PROPOFOL N/A 09/04/2018   Procedure: COLONOSCOPY WITH PROPOFOL;  Surgeon: Pasty Spillers, MD;  Location: [facility];  Service: Endoscopy;  Laterality: N/A;   ESOPHAGOGASTRODUODENOSCOPY (EGD) WITH PROPOFOL N/A 09/04/2018   Procedure: ESOPHAGOGASTRODUODENOSCOPY (EGD) WITH PROPOFOL;  Surgeon: Pasty Spillers, MD;  Location: [facility];  Service: Endoscopy;  Laterality: N/A;   LAPAROSCOPY     for endometriosis x2   TONSILLECTOMY     TOTAL  KNEE ARTHROPLASTY Left 07/18/2021   Procedure: TOTAL KNEE ARTHROPLASTY;  Surgeon: Durene Romans, MD;  Location: [facility];  Service: Orthopedics;  Laterality: Left;   TURBINATE REDUCTION     Patient Active Problem List   Diagnosis Date Noted   Overactive bladder 04/04/2022   Unilateral primary osteoarthritis, right hip 03/20/2022   Dysuria 01/07/2022   Postmenopausal estrogen deficiency 11/13/2021   Leg length inequality 11/06/2021   Impaired ambulation 10/23/2021   Contact dermatitis 10/16/2021   SI joint arthritis 08/24/2021   Spinal stenosis, lumbar region, without neurogenic claudication 08/24/2021   Degeneration of lumbar intervertebral disc 07/24/2021   Stiffness of left knee 07/21/2021   S/P total knee arthroplasty, left 07/18/2021   Preop examination 07/07/2021   Right hip pain 07/07/2021   Low back pain 07/07/2021   Drug-induced immunodeficiency 06/12/2021   Impingement syndrome of right shoulder region 06/12/2021   Preoperative evaluation of a medical condition to rule out surgical contraindications (TAR required) 06/08/2021   COVID-19 01/24/2021   Hammer toe 04/22/2020   Metatarsalgia of left foot 04/22/2020   Osteoarthritis of midfoot 04/22/2020   Duodenal ulcer 11/12/2019   Stress 11/12/2019   Morbid obesity 11/06/2019   Pain in left knee 07/30/2019   Aortic stenosis, moderate 02/06/2018   Maxillary sinusitis, chronic 10/24/2017   Chronic diarrhea 10/09/2017   Acid reflux 07/15/2017   Asymptomatic  carotid artery stenosis 07/15/2017   Cyst of skin 07/15/2017   Immunocompromised 11/09/2016   Allergic rhinitis 11/02/2016   Benign essential tremor 11/02/2016   History of hypertension 11/02/2016   Postmenopausal symptoms 07/01/2015   Seasonal allergic rhinitis due to pollen 07/01/2015   Fatty liver 09/08/2007   Rheumatoid arthritis 09/08/2007    PCP: Glori Luis, MD   REFERRING PROVIDER: Erick Colace, MD     REFERRING DIAG: [phone] (ICD-10-CM)  - Right hip pain   Rationale for Evaluation and Treatment: Rehabilitation  THERAPY DIAG:  Pain in right hip  Other low back pain  Muscle weakness (generalized)  Other abnormalities of gait and mobility  ONSET DATE: ~ 6 months  SUBJECTIVE:                                                                                                                                                                                     SUBJECTIVE STATEMENT: "My hip has been a little sorer"  PAIN:  Are you having pain? Yes: NPRS scale: currently 6/10  worst 7-8/10 Pain location: Right SI through posterior and anterior hip Pain description: sharp quality to ache Aggravating factors: weight bearing on RLE Relieving factors: Oral meds,lidocaine patches, TENS sitting, lying down  PRECAUTIONS: Fall  WEIGHT BEARING RESTRICTIONS: No  FALLS:  Has patient fallen in last 6 months? No  LIVING ENVIRONMENT: Lives with: lives alone Lives in: House/apartment Stairs:  2 steps into sunk in living room to kitchen and bathroom.  Has a hand rail Has following equipment at home: Dan Humphreys - 2 wheeled  OCCUPATION: retired Runner, broadcasting/film/video  PLOF: Independent with household mobility with device  PATIENT GOALS: walk better, decrease pain, be able to transfer (car, into bed) better, get around home safer  NEXT MD VISIT: Feb Dr Wynn Banker  OBJECTIVE:   DIAGNOSTIC FINDINGS:   IMPRESSION: x-ray 02/24/22 1. Osteolysis of the right femoral head which has significantly progressed compared with 06/16/2019 and severe right hip joint space narrowing as can be seen with rapidly progressive osteoarthritis. Septic arthritis is considered much less likely.   MRI lumbar spine 10/23/21 IMPRESSION: 1. Widespread lumbar spine degeneration with no acute osseous abnormality. Mild scoliosis and multilevel spondylolisthesis   2. Mild to moderate multifactorial spinal stenosis at L2-L3. Mild left lateral recess stenosis at L1-L2 and  L4-L5. Up to moderate neural foraminal stenosis at the left L2, right L3, left L4 and left L5 nerve levels.   3. Partially visible distended gallbladder. Query right upper quadrant pain.  PATIENT SURVEYS:  FOTO 26% with goal of 51 at visit 15 03/20/22 21% DC 05/07/22: 27%  COGNITION: Overall cognitive status: Within functional limits for  tasks assessed     SENSATION: WFL  MUSCLE LENGTH: Hamstrings: wfl   POSTURE: rounded shoulders, weight shift left, and right hip slightly higher than left  PALPATION: Right hip At gr trochanter TTP  LUMBAR ROM:   Limited with side bending Increased hip pain  LOWER EXTREMITY ROM:     Active  Right eval Left eval  Hip flexion 40d active 90 passive 110 active  Hip extension    Hip abduction    Hip adduction    Hip internal rotation 20   Hip external rotation 30   Knee flexion 110 120  Knee extension 0 0 stiff  Ankle dorsiflexion wfl wfl  Ankle plantarflexion    Ankle inversion    Ankle eversion     (Blank rows = not tested)  LOWER EXTREMITY MMT:    MMT Right eval Left eval Right / Left 05/07/22  Hip flexion 3- 3+ NT / 4+  Hip extension     Hip abduction 3- 5   Hip adduction 4 5   Hip internal rotation     Hip external rotation     Knee flexion 5 4+ NT / 5  Knee extension 5 4+ NT / 5  Ankle dorsiflexion     Ankle plantarflexion     Ankle inversion     Ankle eversion      (Blank rows = not tested)  LUMBAR SPECIAL TESTS:  Maisie Fus test: Negative  FUNCTIONAL TESTS:  TBA at later date as tolerated  GAIT: Distance walked: 463ft Assistive device utilized: Environmental consultant - 2 wheeled Level of assistance: Modified independence Comments: decreased stance time lle, right leaning, lack of right hip flex just clearing right foot from floor. Cadence slowed and antalgic  TODAY'S TREATMENT:                                                                                                                              Pt seen for aquatic  therapy today.  Treatment took place in water 3.5-4+ ft in depth at the [facility] pool. Temp of water was 91.  Pt entered/exited the pool via stairs using step to gait and hand rail and cga-sba     *stair climbing instruction               *Seated on chair lift: cycling; add/abd, LAQ with DF 20 reps x 3, ankle ROM               * walking forward and backward 4.99ft, facing lap pool, UE support yellow noodle. Gait training with forward amb cues for heel strike and toe off as able.               *side stepping ue support yellow noodle.               *ue support yellow noodle:  hip add/abd; flex/extension; df; PF    *yellow noodle wrapped anteriorly under waist, prone hands on  wall kicking, ant core stretching; XX skiing                      Pt requires the buoyancy and hydrostatic pressure of water for support, and to offload joints by unweighting joint load by at least 50 % in navel deep water and by at least 75-80% in chest to neck deep water.  Viscosity of the water is needed for resistance of strengthening. Water current perturbations provides challenge to standing balance requiring increased core activatio   PATIENT EDUCATION:  Education details: aquatics modifications/ progressions  Person educated: Patient Education method: Explanation Education comprehension: verbalized understanding and returned demonstration  HOME EXERCISE PROGRAM: Access Code: [email or web address] URL: [email or web address]   ASSESSMENT:  CLINICAL IMPRESSION:   Surgical date is 05/15/22   Pt is ready for surgical intervention of Right THR next week.  She has reached her max potential in environment. Uncertain if she will return.  She reports some increase discomfort in hip over last few days but believes it is due to her added ROM she has from the PT intervention. Final aquatic HEP not applicable as her needs will be changing after surgery.  Will re-address if she returns post  surgery.    PN: Patient presents today for progress note.  Upon looking at her x-ray and hearing symptoms, she is not appropriate for land-based therapy until after her hip replacement surgery.  Attempting to strengthen via land-based creates too high of a risk for the patient.  She is having her hip replacement done on April 19 and will continue with aquatic exercises in skilled PT on a weekly basis to monitor and reports she will be able to go to the [facility] at least 1 more day of the week.  Plan of care extended to cover this timeframe. We did discuss exercises on land versus in the pool and the importance of maintaining strength, flexibility, and mobility in order to improve outcomes following surgery. Patient reports there are plans for her to go to a skilled nursing facility following surgery as she lives on her own and I encouraged her to obtain a referral to continue with outpatient physical therapy so that we can schedule her as soon as we know she will be discharged from the nursing facility.   OBJECTIVE IMPAIRMENTS: Abnormal gait, decreased activity tolerance, decreased balance, decreased endurance, decreased mobility, difficulty walking, decreased ROM, decreased strength, decreased safety awareness, increased muscle spasms, impaired flexibility, improper body mechanics, postural dysfunction, obesity, and pain.   ACTIVITY LIMITATIONS: carrying, lifting, bending, sitting, standing, squatting, sleeping, stairs, transfers, bed mobility, and locomotion level  PARTICIPATION LIMITATIONS: meal prep, cleaning, laundry, driving, shopping, community activity, occupation, and yard work  PERSONAL FACTORS: Age, Fitness, Past/current experiences, Time since onset of injury/illness/exacerbation, and 1-2 comorbidities: obesity, leg length difference  are also affecting patient's functional outcome.   REHAB POTENTIAL: Good  CLINICAL DECISION MAKING: Evolving/moderate complexity  EVALUATION COMPLEXITY:  Moderate   GOALS: Goals reviewed with patient? Yes  SHORT TERM GOALS: Target date: 03/22/22  Pt will tolerate aquatic therapy sessions without increase in pain to demonstrate positive response to intervention Baseline:TBA Goal status: Met 03/20/22  2.  Pt will tolerate driving without increase in pain Baseline: barely tolerable Goal status: Met 03/20/22  3.  Pt will be able to move right foot on and off of car pedals with ease. Baseline: much difficulty and pain Goal status: Met 03/20/22  4.  Pt will improve rle hip flex and abd strength  to 3+/5 or > to demonstrate the safe ability to leverage RLE with all transfers with improved safety. Baseline: 3- Goal status: deferred due to pending surgical intervention 04/25/22  5.  Pt to be indep and safe with putting fww into car then getting into drivers side Baseline: "worse thing I do very unsteady" Goal status: ongoing 03/20/22; Met 04/25/22  6.  Pt will use stairs into and out of pool to improve strength with completion improving safety with stairs at home. Baseline:  Goal status: ongoing 03/20/22. Met 04/30/22  LONG TERM GOALS: Target date: 04/26/22  Foto goal of 51 will be met to demonstrate pt improved perception of her functional mobility Baseline: 26% Goal status: not met 05/07/22  27%  2.  Pt to decrease max pain to <or=4/10 to demonstrate improvement in management of condition. Baseline: 8/10 Goal status: Not met 05/07/22  down ~ 1 point to 7/10  3.  Pt will be able to lift her rle into bed without UE assistance demonstrating an improvement in strength Baseline: UE lifts rle into bed Goal status: deferred.  Pt has been sleeping in recliner 04/30/22  4.  Pt will be indep with final HEP (land and/or aquatic as indicated) for long term management of condition Baseline: basic Sup exercises Goal status: Deferred 05/07/22  5.  Pt will improve strength in all weak areas up at least 1 full grade to improve functional mobility and  ADL's Baseline: see chart Gaol status: Met lle/ Not tested right   PLAN:  PT FREQUENCY: 1-2x/week  PT DURATION: 10 weeks  PLANNED INTERVENTIONS: Therapeutic exercises, Therapeutic activity, Neuromuscular re-education, Balance training, Gait training, Patient/Family education, Self Care, Joint mobilization, Joint manipulation, Stair training, Orthotic/Fit training, DME instructions, Aquatic Therapy, Dry Needling, Electrical stimulation, Cryotherapy, Moist heat, scar mobilization, Splintting, Taping, Ultrasound, Parrafin, Biofeedback, Ionotophoresis 4mg /ml Dexamethasone, Manual therapy, and Re-evaluation.  PLAN FOR NEXT SESSION: Aquatics for strengthening LE/core; gait and balance retraining; stair climbing, aerobic capacity improvement; Pt edu/training, sup <>sit  Corrie Dandy Tomma Lightning) Alejandrina Raimer MPT 05/07/22 12:56 PM    Referring diagnosis? R hip pain Treatment diagnosis? (if different than referring diagnosis) right hip pain What was this (referring dx) caused by? []  Surgery []  Fall [x]  Ongoing issue [x]  Arthritis []  Other: ____________  Laterality: []  Rt []  Lt [x]  Both  Check all possible CPT codes:  *CHOOSE 10 OR LESS*    [x]  97110 (Therapeutic Exercise)  []  92507 (SLP Treatment)  []  97112 (Neuro Re-ed)   []  92526 (Swallowing Treatment)   [x]  97116 (Gait Training)   []  [id number] (Cognitive Training, 1st 15 minutes) [x]  97140 (Manual Therapy)   []  97130 (Cognitive Training, each add'l 15 minutes)  [x]  97164 (Re-evaluation)                              []  Other, List CPT Code ____________  [x]  97530 (Therapeutic Activities)     [x]  97535 (Self Care)   []  All codes above (97110 - 97535)  []  97012 (Mechanical Traction)  [x]  97014 (E-stim Unattended)  [x]  97032 (E-stim manual)  [x]  97033 (Ionto)  [x]  97035 (Ultrasound) [x]  97750 (Physical Performance Training) [x]  [id number] (Aquatic Therapy) []  97016 (Vasopneumatic Device) []  16109 (Paraffin) []  97034 (Contrast Bath) []  97597 (Wound  Care 1st 20 sq cm) []  97598 (Wound Care each add'l 20 sq cm) [x]  97760 (Orthotic Fabrication, Fitting, Training Initial) []  [id number] (Prosthetic Management and Training Initial) []  [id number] (Orthotic or Prosthetic Training/ Modification  Subsequent)

## 2022-05-07 ENCOUNTER — Encounter (HOSPITAL_BASED_OUTPATIENT_CLINIC_OR_DEPARTMENT_OTHER): Payer: Self-pay | Admitting: Physical Therapy

## 2022-05-07 ENCOUNTER — Ambulatory Visit (HOSPITAL_BASED_OUTPATIENT_CLINIC_OR_DEPARTMENT_OTHER): Payer: Medicare PPO | Attending: Physical Medicine & Rehabilitation | Admitting: Physical Therapy

## 2022-05-07 DIAGNOSIS — R2689 Other abnormalities of gait and mobility: Secondary | ICD-10-CM

## 2022-05-07 DIAGNOSIS — M25551 Pain in right hip: Secondary | ICD-10-CM | POA: Diagnosis not present

## 2022-05-07 DIAGNOSIS — M5459 Other low back pain: Secondary | ICD-10-CM

## 2022-05-07 DIAGNOSIS — M6281 Muscle weakness (generalized): Secondary | ICD-10-CM | POA: Diagnosis not present

## 2022-05-09 ENCOUNTER — Telehealth: Payer: Self-pay | Admitting: Family Medicine

## 2022-05-09 DIAGNOSIS — Z23 Encounter for immunization: Secondary | ICD-10-CM | POA: Diagnosis not present

## 2022-05-09 DIAGNOSIS — Z01818 Encounter for other preprocedural examination: Secondary | ICD-10-CM | POA: Diagnosis not present

## 2022-05-09 DIAGNOSIS — Z01810 Encounter for preprocedural cardiovascular examination: Secondary | ICD-10-CM | POA: Diagnosis not present

## 2022-05-09 DIAGNOSIS — I350 Nonrheumatic aortic (valve) stenosis: Secondary | ICD-10-CM | POA: Diagnosis not present

## 2022-05-09 DIAGNOSIS — I10 Essential (primary) hypertension: Secondary | ICD-10-CM | POA: Diagnosis not present

## 2022-05-09 NOTE — Progress Notes (Signed)
Anesthesia Chart Review:  72 year old female with pertinent history including GERD, HTN, mild to moderate aortic stenosis.  Follows with cardiologist Dr. Saralyn Pilar at [facility] for history of moderate aortic stenosis and hypertension.  She was seen for 324 for preop evaluation.  Per note, "72 year old female with a history of rheumatoid arthritis, non-rheumatic aortic stenosis, and hypertension, originally referred for preoperative cardiovascular evaluation prior to sinus surgery, with a several month history of progressive exertional dyspnea, which progressively occurred after becoming less active and gaining weight. The patient has been significantly less active due to knee bursitis. 2D echocardiogram 07/13/2020 revealed normal left ventricular function with LVEF greater than 55%, severely calcified mitral annulus, without significant stenosis or regurgitation, and mild-moderate aortic stenosis with valve area 1.1 cm, mean gradient 17.1 mmHg and peak gradient 28.6 mmHg. Patient underwent successful left total knee replacement surgery 07/18/2021. The patient is now awaiting right total hip replacement surgery. Patient remains asymptomatic. 2D echocardiogram 12/21/2021 revealed LVEF greater than 55%, with moderate aortic stenosis, calculated aortic valve area 0.86 cm, peak velocity 2.93 m/s, peak gradient 30.4 mmHg, mean gradient 21.3 mmHg. The patient should be at low and acceptable risk for right total hip replacement surgery."  Preop labs reviewed, platelets mildly low at 138, otherwise unremarkable.  EKG 04/13/2022: Normal sinus rhythm.  Rate 64. Left axis deviation. Minimal voltage criteria for LVH, may be normal variant ( R in aVL )  TTE 12/21/2021 (Care Everywhere): INTERPRETATION  NORMAL LEFT VENTRICULAR SYSTOLIC FUNCTION WITH AN ESTIMATED EF = >55 %  NORMAL RIGHT VENTRICULAR SYSTOLIC FUNCTION  MILD TRICUSPID AND MITRAL VALVE INSUFFICIENCY  MILD-TO-MODERATE AORTIC VALVE STENOSIS  MILD LA ENLARGEMENT      Wynonia Musty [facility] Short Stay Center/Anesthesiology Phone [phone] 05/09/2022 4:18 PM

## 2022-05-09 NOTE — Anesthesia Preprocedure Evaluation (Addendum)
Anesthesia Evaluation  Patient identified by MRN, date of birth, ID band Patient awake    Reviewed: Allergy & Precautions, NPO status , Patient's Chart, lab work & pertinent test results  Airway Mallampati: II  TM Distance: >3 FB Neck ROM: Full    Dental   Pulmonary neg pulmonary ROS   breath sounds clear to auscultation       Cardiovascular hypertension, + CAD  + Valvular Problems/Murmurs AS  Rhythm:Regular Rate:Normal  Normal EF. Mod AS   Neuro/Psych negative neurological ROS     GI/Hepatic Neg liver ROS, PUD,GERD  ,,  Endo/Other  negative endocrine ROS    Renal/GU negative Renal ROS     Musculoskeletal  (+) Arthritis ,    Abdominal   Peds  Hematology negative hematology ROS (+)   Anesthesia Other Findings   Reproductive/Obstetrics                             Anesthesia Physical Anesthesia Plan  ASA: 3  Anesthesia Plan: Spinal   Post-op Pain Management: Tylenol PO (pre-op)*   Induction:   PONV Risk Score and Plan: 2 and Propofol infusion, Ondansetron and Treatment may vary due to age or medical condition  Airway Management Planned: Natural Airway and Simple Face Mask  Additional Equipment:   Intra-op Plan:   Post-operative Plan:   Informed Consent: I have reviewed the patients History and Physical, chart, labs and discussed the procedure including the risks, benefits and alternatives for the proposed anesthesia with the patient or authorized representative who has indicated his/her understanding and acceptance.       Plan Discussed with: CRNA  Anesthesia Plan Comments: (PAT note by Antionette Poles, PA-C:  72 year old female with pertinent history including GERD, HTN, mild to moderate aortic stenosis.  Follows with cardiologist Dr. Darrold Junker at [facility] for history of moderate aortic stenosis and hypertension.  She was seen for 324 for preop evaluation.  Per note, "72 year old  female with a history of rheumatoid arthritis, non-rheumatic aortic stenosis, and hypertension, originally referred for preoperative cardiovascular evaluation prior to sinus surgery, with a several month history of progressive exertional dyspnea, which progressively occurred after becoming less active and gaining weight. The patient has been significantly less active due to knee bursitis. 2D echocardiogram 07/13/2020 revealed normal left ventricular function with LVEF greater than 55%, severely calcified mitral annulus, without significant stenosis or regurgitation, and mild-moderate aortic stenosis with valve area 1.1 cm, mean gradient 17.1 mmHg and peak gradient 28.6 mmHg. Patient underwent successful left total knee replacement surgery 07/18/2021. The patient is now awaiting right total hip replacement surgery. Patient remains asymptomatic. 2D echocardiogram 12/21/2021 revealed LVEF greater than 55%, with moderate aortic stenosis, calculated aortic valve area 0.86 cm, peak velocity 2.93 m/s, peak gradient 30.4 mmHg, mean gradient 21.3 mmHg. The patient should be at low and acceptable risk for right total hip replacement surgery."  Preop labs reviewed, platelets mildly low at 138, otherwise unremarkable.  EKG 04/13/2022: Normal sinus rhythm.  Rate 64. Left axis deviation. Minimal voltage criteria for LVH, may be normal variant ( R in aVL )  TTE 12/21/2021 (Care Everywhere): INTERPRETATION  NORMAL LEFT VENTRICULAR SYSTOLIC FUNCTION WITH AN ESTIMATED EF = >55 %  NORMAL RIGHT VENTRICULARSYSTOLIC FUNCTION  MILD TRICUSPID AND MITRAL VALVE INSUFFICIENCY  MILD-TO-MODERATE AORTIC VALVE STENOSIS  MILD LA ENLARGEMENT    )        Anesthesia Quick Evaluation

## 2022-05-09 NOTE — Telephone Encounter (Signed)
Prescription Request  05/09/2022  LOV: 11/13/2021  What is the name of the medication or equipment? solifenacin (VESICARE) 10 MG tablet  Have you contacted your pharmacy to request a refill? Yes   Which pharmacy would you like this sent to?   [facility] [address] [place] [address] Phone: [phone] Fax: [phone]      Patient notified that their request is being sent to the clinical staff for review and that they should receive a response within 2 business days.   Please advise at [facility] [phone]   As per pt, she need the other med Dr. Derrel Nip was going to prescribed her, or increase this one to 10mg . Any questions she's available @[phone].

## 2022-05-10 MED ORDER — SOLIFENACIN SUCCINATE 10 MG PO TABS: 10.0000 mg | ORAL_TABLET | Freq: Every day | ORAL | 2 refills | Status: DC

## 2022-05-10 NOTE — Telephone Encounter (Signed)
Sent to pharmacy 

## 2022-05-14 ENCOUNTER — Ambulatory Visit (HOSPITAL_BASED_OUTPATIENT_CLINIC_OR_DEPARTMENT_OTHER): Payer: Medicare PPO | Admitting: Physical Therapy

## 2022-05-14 ENCOUNTER — Telehealth: Payer: Self-pay | Admitting: *Deleted

## 2022-05-14 NOTE — Care Plan (Signed)
[facility] RNCM call to patient to discuss her upcoming Right total hip arthroplasty with Dr. Magnus Ivan on 05/15/22. She is an Ortho bundle patient through [facility] and is agreeable to case management. She lives alone and discussed her brother and/or sister coming to assist her if she goes home. She first discussed going to SNF for STR, but thinks she would rather go home. She was reaching out to her brother and sister to see when they could come this week and will get back with CM before end of the day, but she has not yet at time of office close. She does have a RW already. Anticipate HHPT will be needed after discharge if goes home. Referral already made to [facility] after choice provided. Reviewed post op care instructions. Will continue to follow for needs.

## 2022-05-14 NOTE — H&P (Signed)
TOTAL HIP ADMISSION H&P  Patient is admitted for right total hip arthroplasty.  Subjective:  Chief Complaint: right hip pain  HPI: Wanda Lang, 72 y.o. female, has a history of pain and functional disability in the right hip(s) due to arthritis and patient has failed non-surgical conservative treatments for greater than 12 weeks to include NSAID's and/or analgesics, corticosteriod injections, weight reduction as appropriate, and   .  Onset of symptoms was abrupt starting 1 years ago with rapidlly worsening course since that time.The patient noted no past surgery on the right hip(s).  Patient currently rates pain in the right hip at 10 out of 10 with activity. Patient has night pain, worsening of pain with activity and weight bearing, trendelenberg gait, pain that interfers with activities of daily living, and pain with passive range of motion. Patient has evidence of subchondral cysts, subchondral sclerosis, and joint space narrowing by imaging studies. This condition presents safety issues increasing the risk of falls.  There is no current active infection.  Patient Active Problem List   Diagnosis Date Noted   Overactive bladder 04/04/2022   Unilateral primary osteoarthritis, right hip 03/20/2022   Dysuria 01/07/2022   Postmenopausal estrogen deficiency 11/13/2021   Leg length inequality 11/06/2021   Impaired ambulation 10/23/2021   Contact dermatitis 10/16/2021   SI joint arthritis 08/24/2021   Spinal stenosis, lumbar region, without neurogenic claudication 08/24/2021   Degeneration of lumbar intervertebral disc 07/24/2021   Stiffness of left knee 07/21/2021   S/P total knee arthroplasty, left 07/18/2021   Preop examination 07/07/2021   Right hip pain 07/07/2021   Low back pain 07/07/2021   Drug-induced immunodeficiency 06/12/2021   Impingement syndrome of right shoulder region 06/12/2021   Preoperative evaluation of a medical condition to rule out surgical contraindications (TAR  required) 06/08/2021   COVID-19 01/24/2021   Hammer toe 04/22/2020   Metatarsalgia of left foot 04/22/2020   Osteoarthritis of midfoot 04/22/2020   Duodenal ulcer 11/12/2019   Stress 11/12/2019   Morbid obesity 11/06/2019   Pain in left knee 07/30/2019   Aortic stenosis, moderate 02/06/2018   Maxillary sinusitis, chronic 10/24/2017   Chronic diarrhea 10/09/2017   Acid reflux 07/15/2017   Asymptomatic carotid artery stenosis 07/15/2017   Cyst of skin 07/15/2017   Immunocompromised 11/09/2016   Allergic rhinitis 11/02/2016   Benign essential tremor 11/02/2016   History of hypertension 11/02/2016   Postmenopausal symptoms 07/01/2015   Seasonal allergic rhinitis due to pollen 07/01/2015   Fatty liver 09/08/2007   Rheumatoid arthritis 09/08/2007   Past Medical History:  Diagnosis Date   Allergy    Arthritis    Coronary artery disease    Fatty liver    GERD (gastroesophageal reflux disease)    Heart murmur    Hypertension    Hx   LIVER FUNCTION TESTS, ABNORMAL, HX OF 09/08/2007   Qualifier: Diagnosis of  By: Candice Camp CMA (AAMA), Dottie     Neuromuscular disorder    Essential Tremor    Past Surgical History:  Procedure Laterality Date   ABDOMINAL HYSTERECTOMY     COLONOSCOPY WITH PROPOFOL N/A 09/04/2018   Procedure: COLONOSCOPY WITH PROPOFOL;  Surgeon: Pasty Spillers, MD;  Location: [facility];  Service: Endoscopy;  Laterality: N/A;   ESOPHAGOGASTRODUODENOSCOPY (EGD) WITH PROPOFOL N/A 09/04/2018   Procedure: ESOPHAGOGASTRODUODENOSCOPY (EGD) WITH PROPOFOL;  Surgeon: Pasty Spillers, MD;  Location: [facility];  Service: Endoscopy;  Laterality: N/A;   LAPAROSCOPY     for endometriosis x2   TONSILLECTOMY  TOTAL KNEE ARTHROPLASTY Left 07/18/2021   Procedure: TOTAL KNEE ARTHROPLASTY;  Surgeon: Durene Romanslin, Matthew, MD;  Location: [facility];  Service: Orthopedics;  Laterality: Left;   TURBINATE REDUCTION      No current facility-administered medications for this  encounter.   Current Outpatient Medications  Medication Sig Dispense Refill Last Dose   acetaminophen (TYLENOL) 500 MG tablet Take 1,000 mg by mouth every 6 (six) hours as needed for moderate pain.      b complex vitamins capsule Take 1 capsule by mouth daily.      CALCIUM PO Take 1,200 mg by mouth daily.      celecoxib (CELEBREX) 200 MG capsule Take 1 capsule (200 mg total) by mouth 2 (two) times daily. Do not start this medication until you finish the prednisone dose pack. (Patient taking differently: Take 200 mg by mouth daily.) 60 capsule 0    cycloSPORINE (RESTASIS) 0.05 % ophthalmic emulsion Place 1 drop into both eyes 2 (two) times daily.      Docusate Sodium (DSS) 100 MG CAPS Take 1,000 mg by mouth in the morning, at noon, in the evening, and at bedtime.      estradiol (VIVELLE-DOT) 0.05 MG/24HR patch Place 1 patch onto the skin 2 (two) times a week.      Flaxseed, Linseed, (FLAXSEED OIL) 1200 MG CAPS Take 1,200 mg by mouth daily.      Glucosamine HCl 1000 MG TABS Take 1,000 mg by mouth daily.      hydroxychloroquine (PLAQUENIL) 200 MG tablet Take 200 mg by mouth 2 (two) times daily.      lidocaine (LIDODERM) 5 % Place 1 patch onto the skin every 12 (twelve) hours. Remove & Discard patch within 12 hours or as directed by MD 10 patch 1    Magnesium 100 MG TABS Take 100 mg by mouth 2 (two) times a week.      methocarbamol (ROBAXIN) 500 MG tablet Take 500 mg by mouth every 6 (six) hours.      mometasone (ELOCON) 0.1 % cream Apply topically to affected areas behind ears and neck twice daily for itchy rash use for up to 2 weeks. Can also use at itchy areas at abdomen. (Patient taking differently: Apply 1 Application topically daily as needed (rash/itching).) 50 g 1    Multiple Vitamin (MULTIVITAMIN WITH MINERALS) TABS tablet Take 1 tablet by mouth daily.      oxyCODONE (OXY IR/ROXICODONE) 5 MG immediate release tablet Take 1 tablet (5 mg total) by mouth every 6 (six) hours as needed. (Patient  taking differently: Take 5 mg by mouth every 6 (six) hours.) 10 tablet 0    polyethylene glycol (MIRALAX / GLYCOLAX) 17 g packet Take 17 g by mouth daily as needed for mild constipation. (Patient taking differently: Take 17 g by mouth daily.) 14 each 0    POTASSIUM PO Take 3 tablets by mouth daily.      trolamine salicylate (BLUE-EMU HEMP) 10 % cream Apply 1 Application topically as needed for muscle pain.      Vitamin D, Ergocalciferol, (DRISDOL) 1.25 MG (50000 UNIT) CAPS capsule Take 50,000 Units by mouth every 7 (seven) days.      Zinc 100 MG TABS Take 100 mg by mouth 2 (two) times a week.      solifenacin (VESICARE) 10 MG tablet Take 1 tablet (10 mg total) by mouth daily. 90 tablet 2    Allergies  Allergen Reactions   Penicillins Itching    Tolerated Cephalosporin 07/18/21.  Other reaction(s): wheezing   Amoxicillin Itching   Misc. Sulfonamide Containing Compounds    Rosanil Cleanser [Sulfacetamide Sodium-Sulfur] Other (See Comments)    Unknown reaction    Sulfonamide Derivatives     Unknown reaction    Ylang-Ylang [Cananga Oil (Ylang-Ylang)] Itching    Social History   Tobacco Use   Smoking status: Never   Smokeless tobacco: Never  Substance Use Topics   Alcohol use: Not Currently    Alcohol/week: 1.0 standard drink of alcohol    Types: 1 Glasses of wine per week    Family History  Problem Relation Age of Onset   Alcohol abuse Mother    Arthritis Mother    Hyperlipidemia Mother    Heart disease Mother    Stroke Mother    Hypertension Mother    Depression Mother    Anxiety disorder Mother    Arthritis Father    Lung cancer Paternal Grandfather    Kidney disease Paternal Grandfather      Review of Systems  Objective:  Physical Exam Vitals reviewed.  Constitutional:      Appearance: Normal appearance.  HENT:     Head: Normocephalic and atraumatic.  Eyes:     Extraocular Movements: Extraocular movements intact.     Pupils: Pupils are equal, round, and  reactive to light.  Cardiovascular:     Rate and Rhythm: Normal rate.  Pulmonary:     Effort: Pulmonary effort is normal.     Breath sounds: Normal breath sounds.  Abdominal:     Palpations: Abdomen is soft.  Musculoskeletal:     Cervical back: Normal range of motion and neck supple.     Right hip: Tenderness and bony tenderness present. Decreased range of motion. Decreased strength.  Neurological:     Mental Status: She is alert and oriented to person, place, and time.  Psychiatric:        Behavior: Behavior normal.     Vital signs in last 24 hours:    Labs:   Estimated body mass index is 33.4 kg/m as calculated from the following:   Height as of 05/03/22: 5\' 4"  (1.626 m).   Weight as of 05/03/22: 88.3 kg.   Imaging Review Plain radiographs demonstrate severe degenerative joint disease of the right hip(s). The bone quality appears to be fair for age and reported activity level.      Assessment/Plan:  End stage arthritis, right hip(s)  The patient history, physical examination, clinical judgement of the provider and imaging studies are consistent with end stage degenerative joint disease of the right hip(s) and total hip arthroplasty is deemed medically necessary. The treatment options including medical management, injection therapy, arthroscopy and arthroplasty were discussed at length. The risks and benefits of total hip arthroplasty were presented and reviewed. The risks due to aseptic loosening, infection, stiffness, dislocation/subluxation,  thromboembolic complications and other imponderables were discussed.  The patient acknowledged the explanation, agreed to proceed with the plan and consent was signed. Patient is being admitted for inpatient treatment for surgery, pain control, PT, OT, prophylactic antibiotics, VTE prophylaxis, progressive ambulation and ADL's and discharge planning.The patient is planning to be discharged home with home health services

## 2022-05-14 NOTE — Telephone Encounter (Signed)
Pt called in staying that after she saw Dr. Darrick Huntsman, the plan were to change med Vesicare below if it didn't work. As per pt, it didn't work, so she would like to change it to another one. Any questions, she's available [phone].

## 2022-05-14 NOTE — Telephone Encounter (Signed)
Ortho bundle pre-op call completed. 

## 2022-05-15 ENCOUNTER — Ambulatory Visit (HOSPITAL_BASED_OUTPATIENT_CLINIC_OR_DEPARTMENT_OTHER): Payer: Medicare PPO | Admitting: Anesthesiology

## 2022-05-15 ENCOUNTER — Observation Stay (HOSPITAL_COMMUNITY): Payer: Medicare PPO

## 2022-05-15 ENCOUNTER — Observation Stay (HOSPITAL_COMMUNITY)
Admission: RE | Admit: 2022-05-15 | Discharge: 2022-05-18 | Disposition: A | Discharge status: Skilled Nursing Facility | Payer: Medicare PPO | Source: Ambulatory Visit | Attending: Orthopaedic Surgery | Admitting: Orthopaedic Surgery

## 2022-05-15 ENCOUNTER — Other Ambulatory Visit: Payer: Self-pay

## 2022-05-15 ENCOUNTER — Ambulatory Visit (HOSPITAL_COMMUNITY): Payer: Medicare PPO | Admitting: Physician Assistant

## 2022-05-15 ENCOUNTER — Encounter (HOSPITAL_COMMUNITY)
Admission: RE | Disposition: A | Discharge status: Skilled Nursing Facility | Payer: Self-pay | Source: Ambulatory Visit | Attending: Orthopaedic Surgery

## 2022-05-15 ENCOUNTER — Ambulatory Visit: Payer: Medicare PPO | Admitting: Family Medicine

## 2022-05-15 ENCOUNTER — Ambulatory Visit (HOSPITAL_COMMUNITY): Payer: Medicare PPO

## 2022-05-15 ENCOUNTER — Encounter (HOSPITAL_COMMUNITY): Payer: Self-pay | Admitting: Orthopaedic Surgery

## 2022-05-15 DIAGNOSIS — Z96641 Presence of right artificial hip joint: Secondary | ICD-10-CM

## 2022-05-15 DIAGNOSIS — Z79899 Other long term (current) drug therapy: Secondary | ICD-10-CM | POA: Diagnosis not present

## 2022-05-15 DIAGNOSIS — M879 Osteonecrosis, unspecified: Secondary | ICD-10-CM | POA: Diagnosis not present

## 2022-05-15 DIAGNOSIS — M87851 Other osteonecrosis, right femur: Secondary | ICD-10-CM | POA: Insufficient documentation

## 2022-05-15 DIAGNOSIS — M1611 Unilateral primary osteoarthritis, right hip: Secondary | ICD-10-CM

## 2022-05-15 DIAGNOSIS — Z471 Aftercare following joint replacement surgery: Secondary | ICD-10-CM | POA: Diagnosis not present

## 2022-05-15 DIAGNOSIS — I2510 Atherosclerotic heart disease of native coronary artery without angina pectoris: Secondary | ICD-10-CM | POA: Diagnosis not present

## 2022-05-15 DIAGNOSIS — I350 Nonrheumatic aortic (valve) stenosis: Secondary | ICD-10-CM | POA: Diagnosis not present

## 2022-05-15 DIAGNOSIS — Z8616 Personal history of COVID-19: Secondary | ICD-10-CM | POA: Insufficient documentation

## 2022-05-15 DIAGNOSIS — Z96652 Presence of left artificial knee joint: Secondary | ICD-10-CM | POA: Diagnosis not present

## 2022-05-15 DIAGNOSIS — Z96649 Presence of unspecified artificial hip joint: Secondary | ICD-10-CM

## 2022-05-15 DIAGNOSIS — I10 Essential (primary) hypertension: Secondary | ICD-10-CM

## 2022-05-15 HISTORY — DX: Overactive bladder: N32.81

## 2022-05-15 HISTORY — PX: TOTAL HIP ARTHROPLASTY: SHX124

## 2022-05-15 SURGERY — ARTHROPLASTY, HIP, TOTAL, ANTERIOR APPROACH
Anesthesia: Spinal | Site: Hip | Laterality: Right

## 2022-05-15 MED ORDER — FENTANYL CITRATE (PF) 250 MCG/5ML IJ SOLN
INTRAMUSCULAR | Status: DC | PRN
  Administered 2022-05-15 (×2): 50 ug via INTRAVENOUS
  Administered 2022-05-15 (×2): 25 ug via INTRAVENOUS

## 2022-05-15 MED ORDER — MIDAZOLAM HCL 2 MG/2ML IJ SOLN
INTRAMUSCULAR | Status: AC
  Filled 2022-05-15: qty 2

## 2022-05-15 MED ORDER — OXYCODONE HCL 5 MG PO TABS
5.0000 mg | ORAL_TABLET | Freq: Once | ORAL | Status: AC
  Administered 2022-05-15: 5 mg via ORAL

## 2022-05-15 MED ORDER — ORAL CARE MOUTH RINSE: 15.0000 mL | Freq: Once | OROMUCOSAL | Status: AC

## 2022-05-15 MED ORDER — DEXAMETHASONE SODIUM PHOSPHATE 10 MG/ML IJ SOLN
INTRAMUSCULAR | Status: AC
  Filled 2022-05-15: qty 1

## 2022-05-15 MED ORDER — CHLORHEXIDINE GLUCONATE 0.12 % MT SOLN
15.0000 mL | Freq: Once | OROMUCOSAL | Status: AC
  Administered 2022-05-15: 15 mL via OROMUCOSAL
  Filled 2022-05-15: qty 15

## 2022-05-15 MED ORDER — ALUM & MAG HYDROXIDE-SIMETH 200-200-20 MG/5ML PO SUSP: 30.0000 mL | ORAL | Status: DC | PRN

## 2022-05-15 MED ORDER — LACTATED RINGERS IV SOLN: INTRAVENOUS | Status: DC

## 2022-05-15 MED ORDER — PHENYLEPHRINE HCL-NACL 20-0.9 MG/250ML-% IV SOLN
INTRAVENOUS | Status: DC | PRN
  Administered 2022-05-15: 40 ug/min via INTRAVENOUS

## 2022-05-15 MED ORDER — ACETAMINOPHEN 325 MG PO TABS
325.0000 mg | ORAL_TABLET | Freq: Four times a day (QID) | ORAL | Status: DC | PRN
  Administered 2022-05-17 – 2022-05-18 (×2): 650 mg via ORAL
  Filled 2022-05-15 (×2): qty 2

## 2022-05-15 MED ORDER — FENTANYL CITRATE (PF) 100 MCG/2ML IJ SOLN
INTRAMUSCULAR | Status: AC
  Filled 2022-05-15: qty 2

## 2022-05-15 MED ORDER — MENTHOL 3 MG MT LOZG: 1.0000 | LOZENGE | OROMUCOSAL | Status: DC | PRN

## 2022-05-15 MED ORDER — OXYCODONE HCL 5 MG PO TABS
10.0000 mg | ORAL_TABLET | ORAL | Status: DC | PRN
  Administered 2022-05-16: 15 mg via ORAL
  Administered 2022-05-16 – 2022-05-17 (×3): 10 mg via ORAL
  Filled 2022-05-15: qty 2
  Filled 2022-05-15: qty 3

## 2022-05-15 MED ORDER — PROPOFOL 500 MG/50ML IV EMUL
INTRAVENOUS | Status: DC | PRN
  Administered 2022-05-15: 50 ug/kg/min via INTRAVENOUS

## 2022-05-15 MED ORDER — OXYCODONE HCL 5 MG PO TABS
ORAL_TABLET | ORAL | Status: AC
  Filled 2022-05-15: qty 1

## 2022-05-15 MED ORDER — ZINC 100 MG PO TABS: 100.0000 mg | ORAL_TABLET | ORAL | Status: DC

## 2022-05-15 MED ORDER — FENTANYL CITRATE (PF) 250 MCG/5ML IJ SOLN
INTRAMUSCULAR | Status: AC
  Filled 2022-05-15: qty 5

## 2022-05-15 MED ORDER — PHENOL 1.4 % MT LIQD: 1.0000 | OROMUCOSAL | Status: DC | PRN

## 2022-05-15 MED ORDER — POLYETHYLENE GLYCOL 3350 17 G PO PACK
17.0000 g | PACK | Freq: Every day | ORAL | Status: DC | PRN
  Administered 2022-05-16 – 2022-05-18 (×3): 17 g via ORAL
  Filled 2022-05-15 (×3): qty 1

## 2022-05-15 MED ORDER — ONDANSETRON HCL 4 MG PO TABS: 4.0000 mg | ORAL_TABLET | Freq: Four times a day (QID) | ORAL | Status: DC | PRN

## 2022-05-15 MED ORDER — MIDAZOLAM HCL 2 MG/2ML IJ SOLN
INTRAMUSCULAR | Status: DC | PRN
  Administered 2022-05-15 (×2): 1 mg via INTRAVENOUS

## 2022-05-15 MED ORDER — METHOCARBAMOL 500 MG PO TABS
500.0000 mg | ORAL_TABLET | Freq: Four times a day (QID) | ORAL | Status: DC | PRN
  Administered 2022-05-16 – 2022-05-17 (×2): 500 mg via ORAL
  Filled 2022-05-15 (×2): qty 1

## 2022-05-15 MED ORDER — GEMTESA 75 MG PO TABS: 75.0000 mg | ORAL_TABLET | Freq: Every day | ORAL | 2 refills | Status: DC

## 2022-05-15 MED ORDER — ALBUMIN HUMAN 5 % IV SOLN: INTRAVENOUS | Status: DC | PRN

## 2022-05-15 MED ORDER — DEXAMETHASONE SODIUM PHOSPHATE 10 MG/ML IJ SOLN
INTRAMUSCULAR | Status: DC | PRN
  Administered 2022-05-15: 8 mg via INTRAVENOUS

## 2022-05-15 MED ORDER — VANCOMYCIN HCL IN DEXTROSE 1-5 GM/200ML-% IV SOLN
1000.0000 mg | INTRAVENOUS | Status: DC
  Filled 2022-05-15: qty 200

## 2022-05-15 MED ORDER — CEFAZOLIN SODIUM-DEXTROSE 1-4 GM/50ML-% IV SOLN
1.0000 g | Freq: Four times a day (QID) | INTRAVENOUS | Status: AC
  Administered 2022-05-15: 1 g via INTRAVENOUS
  Filled 2022-05-15: qty 50

## 2022-05-15 MED ORDER — OXYCODONE HCL 5 MG PO TABS
ORAL_TABLET | ORAL | Status: AC
  Filled 2022-05-15: qty 2

## 2022-05-15 MED ORDER — ACETAMINOPHEN 500 MG PO TABS
1000.0000 mg | ORAL_TABLET | Freq: Once | ORAL | Status: AC
  Administered 2022-05-15: 1000 mg via ORAL
  Filled 2022-05-15: qty 2

## 2022-05-15 MED ORDER — TRANEXAMIC ACID-NACL 1000-0.7 MG/100ML-% IV SOLN
1000.0000 mg | INTRAVENOUS | Status: AC
  Administered 2022-05-15: 1000 mg via INTRAVENOUS
  Filled 2022-05-15: qty 100

## 2022-05-15 MED ORDER — ONDANSETRON HCL 4 MG/2ML IJ SOLN
INTRAMUSCULAR | Status: AC
  Filled 2022-05-15: qty 2

## 2022-05-15 MED ORDER — CEFAZOLIN SODIUM-DEXTROSE 2-4 GM/100ML-% IV SOLN
INTRAVENOUS | Status: AC
  Filled 2022-05-15: qty 100

## 2022-05-15 MED ORDER — DOCUSATE SODIUM 100 MG PO CAPS
100.0000 mg | ORAL_CAPSULE | Freq: Two times a day (BID) | ORAL | Status: DC
  Administered 2022-05-15 – 2022-05-18 (×6): 100 mg via ORAL
  Filled 2022-05-15 (×6): qty 1

## 2022-05-15 MED ORDER — ONDANSETRON HCL 4 MG/2ML IJ SOLN: 4.0000 mg | Freq: Four times a day (QID) | INTRAMUSCULAR | Status: DC | PRN

## 2022-05-15 MED ORDER — METOCLOPRAMIDE HCL 5 MG PO TABS: 5.0000 mg | ORAL_TABLET | Freq: Three times a day (TID) | ORAL | Status: DC | PRN

## 2022-05-15 MED ORDER — FENTANYL CITRATE (PF) 100 MCG/2ML IJ SOLN
25.0000 ug | INTRAMUSCULAR | Status: DC | PRN
  Administered 2022-05-15 (×3): 50 ug via INTRAVENOUS

## 2022-05-15 MED ORDER — DIPHENHYDRAMINE HCL 12.5 MG/5ML PO ELIX: 12.5000 mg | ORAL_SOLUTION | ORAL | Status: DC | PRN

## 2022-05-15 MED ORDER — PANTOPRAZOLE SODIUM 40 MG PO TBEC
40.0000 mg | DELAYED_RELEASE_TABLET | Freq: Every day | ORAL | Status: DC
  Administered 2022-05-16 – 2022-05-18 (×3): 40 mg via ORAL
  Filled 2022-05-15 (×3): qty 1

## 2022-05-15 MED ORDER — ONDANSETRON HCL 4 MG/2ML IJ SOLN
INTRAMUSCULAR | Status: DC | PRN
  Administered 2022-05-15: 4 mg via INTRAVENOUS

## 2022-05-15 MED ORDER — PROPOFOL 10 MG/ML IV BOLUS
INTRAVENOUS | Status: DC | PRN
  Administered 2022-05-15: 130 mg via INTRAVENOUS
  Administered 2022-05-15: 10 mg via INTRAVENOUS

## 2022-05-15 MED ORDER — METHOCARBAMOL 1000 MG/10ML IJ SOLN: 500.0000 mg | Freq: Four times a day (QID) | INTRAVENOUS | Status: DC | PRN

## 2022-05-15 MED ORDER — ASPIRIN 81 MG PO CHEW
81.0000 mg | CHEWABLE_TABLET | Freq: Two times a day (BID) | ORAL | Status: DC
  Administered 2022-05-15 – 2022-05-18 (×6): 81 mg via ORAL
  Filled 2022-05-15 (×6): qty 1

## 2022-05-15 MED ORDER — POVIDONE-IODINE 10 % EX SWAB
2.0000 | Freq: Once | CUTANEOUS | Status: AC
  Administered 2022-05-15: 2 via TOPICAL

## 2022-05-15 MED ORDER — METOCLOPRAMIDE HCL 5 MG/ML IJ SOLN: 5.0000 mg | Freq: Three times a day (TID) | INTRAMUSCULAR | Status: DC | PRN

## 2022-05-15 MED ORDER — HYDROMORPHONE HCL 1 MG/ML IJ SOLN
0.5000 mg | INTRAMUSCULAR | Status: DC | PRN
  Administered 2022-05-15: 1 mg via INTRAVENOUS
  Filled 2022-05-15: qty 1

## 2022-05-15 MED ORDER — SODIUM CHLORIDE 0.9 % IV SOLN: INTRAVENOUS | Status: DC

## 2022-05-15 MED ORDER — SODIUM CHLORIDE 0.9 % IR SOLN
Status: DC | PRN
  Administered 2022-05-15: 1000 mL

## 2022-05-15 MED ORDER — OXYCODONE HCL 5 MG PO TABS
5.0000 mg | ORAL_TABLET | ORAL | Status: DC | PRN
  Administered 2022-05-15 – 2022-05-16 (×3): 10 mg via ORAL
  Administered 2022-05-17: 5 mg via ORAL
  Filled 2022-05-15 (×5): qty 2

## 2022-05-15 MED ORDER — FESOTERODINE FUMARATE ER 4 MG PO TB24
4.0000 mg | ORAL_TABLET | Freq: Every day | ORAL | Status: DC
  Administered 2022-05-15 – 2022-05-18 (×4): 4 mg via ORAL
  Filled 2022-05-15 (×5): qty 1

## 2022-05-15 MED ORDER — 0.9 % SODIUM CHLORIDE (POUR BTL) OPTIME
TOPICAL | Status: DC | PRN
  Administered 2022-05-15: 1000 mL

## 2022-05-15 MED ORDER — CEFAZOLIN SODIUM-DEXTROSE 2-4 GM/100ML-% IV SOLN
2.0000 g | Freq: Once | INTRAVENOUS | Status: AC
  Administered 2022-05-15: 2 g via INTRAVENOUS

## 2022-05-15 MED ORDER — CYCLOSPORINE 0.05 % OP EMUL: 1.0000 [drp] | Freq: Two times a day (BID) | OPHTHALMIC | Status: DC

## 2022-05-15 SURGICAL SUPPLY — 56 items
APL SKNCLS STERI-STRIP NONHPOA (GAUZE/BANDAGES/DRESSINGS) ×1
BAG COUNTER SPONGE SURGICOUNT (BAG) ×1 IMPLANT
BAG SPNG CNTER NS LX DISP (BAG) ×1
BENZOIN TINCTURE PRP APPL 2/3 (GAUZE/BANDAGES/DRESSINGS) ×1 IMPLANT
BLADE CLIPPER SURG (BLADE) IMPLANT
BLADE SAW SGTL 18X1.27X75 (BLADE) ×1 IMPLANT
COVER SURGICAL LIGHT HANDLE (MISCELLANEOUS) ×1 IMPLANT
CUP SECTOR GRIPTON 50MM (Cup) IMPLANT
DRAPE C-ARM 42X72 X-RAY (DRAPES) ×1 IMPLANT
DRAPE STERI IOBAN 125X83 (DRAPES) ×1 IMPLANT
DRAPE U-SHAPE 47X51 STRL (DRAPES) ×3 IMPLANT
DRSG AQUACEL AG ADV 3.5X10 (GAUZE/BANDAGES/DRESSINGS) ×1 IMPLANT
DURAPREP 26ML APPLICATOR (WOUND CARE) ×1 IMPLANT
ELECT BLADE 4.0 EZ CLEAN MEGAD (MISCELLANEOUS) ×1
ELECT BLADE 6.5 EXT (BLADE) IMPLANT
ELECT REM PT RETURN 9FT ADLT (ELECTROSURGICAL) ×1
ELECTRODE BLDE 4.0 EZ CLN MEGD (MISCELLANEOUS) ×1 IMPLANT
ELECTRODE REM PT RTRN 9FT ADLT (ELECTROSURGICAL) ×1 IMPLANT
FACESHIELD WRAPAROUND (MASK) ×2 IMPLANT
FACESHIELD WRAPAROUND OR TEAM (MASK) ×2 IMPLANT
GLOVE BIOGEL PI IND STRL 8 (GLOVE) ×2 IMPLANT
GLOVE ECLIPSE 8.0 STRL XLNG CF (GLOVE) ×1 IMPLANT
GLOVE ORTHO TXT STRL SZ7.5 (GLOVE) ×2 IMPLANT
GOWN STRL REUS W/ TWL LRG LVL3 (GOWN DISPOSABLE) ×2 IMPLANT
GOWN STRL REUS W/ TWL XL LVL3 (GOWN DISPOSABLE) ×2 IMPLANT
GOWN STRL REUS W/TWL LRG LVL3 (GOWN DISPOSABLE) ×2
GOWN STRL REUS W/TWL XL LVL3 (GOWN DISPOSABLE) ×2
HANDPIECE INTERPULSE COAX TIP (DISPOSABLE) ×1
HEAD FEM STD 32X+5 STRL (Hips) IMPLANT
KIT BASIN OR (CUSTOM PROCEDURE TRAY) ×1 IMPLANT
KIT TURNOVER KIT B (KITS) ×1 IMPLANT
LINER ACET PNNCL PLUS4 NEUTRAL (Hips) IMPLANT
MANIFOLD NEPTUNE II (INSTRUMENTS) ×1 IMPLANT
NS IRRIG 1000ML POUR BTL (IV SOLUTION) ×1 IMPLANT
PACK TOTAL JOINT (CUSTOM PROCEDURE TRAY) ×1 IMPLANT
PAD ARMBOARD 7.5X6 YLW CONV (MISCELLANEOUS) ×1 IMPLANT
PINNACLE PLUS 4 NEUTRAL (Hips) ×1 IMPLANT
SCREW 6.5MMX25MM (Screw) IMPLANT
SET HNDPC FAN SPRY TIP SCT (DISPOSABLE) ×1 IMPLANT
STAPLER VISISTAT 35W (STAPLE) IMPLANT
STEM FEM ACTIS HIGH SZ2 (Stem) IMPLANT
STRIP CLOSURE SKIN 1/2X4 (GAUZE/BANDAGES/DRESSINGS) ×2 IMPLANT
SUT ETHIBOND NAB CT1 #1 30IN (SUTURE) ×1 IMPLANT
SUT MNCRL AB 4-0 PS2 18 (SUTURE) IMPLANT
SUT VIC AB 0 CT1 27 (SUTURE) ×1
SUT VIC AB 0 CT1 27XBRD ANBCTR (SUTURE) ×1 IMPLANT
SUT VIC AB 1 CT1 27 (SUTURE) ×1
SUT VIC AB 1 CT1 27XBRD ANBCTR (SUTURE) ×1 IMPLANT
SUT VIC AB 2-0 CT1 27 (SUTURE) ×2
SUT VIC AB 2-0 CT1 TAPERPNT 27 (SUTURE) ×1 IMPLANT
TOWEL GREEN STERILE (TOWEL DISPOSABLE) ×1 IMPLANT
TOWEL GREEN STERILE FF (TOWEL DISPOSABLE) ×1 IMPLANT
TRAY CATH INTERMITTENT SS 16FR (CATHETERS) IMPLANT
TRAY FOLEY W/BAG SLVR 16FR (SET/KITS/TRAYS/PACK)
TRAY FOLEY W/BAG SLVR 16FR ST (SET/KITS/TRAYS/PACK) IMPLANT
WATER STERILE IRR 1000ML POUR (IV SOLUTION) ×2 IMPLANT

## 2022-05-15 NOTE — Telephone Encounter (Signed)
Called Patient to let her know about the [facility].

## 2022-05-15 NOTE — Interval H&P Note (Signed)
History and Physical Interval Note: Patient understands that she is here today for a right hip replacement to treat her debilitating right hip pain and osteoarthritis.  There has been no acute or interval change in her medical status.  See H&P.  The risks and benefits of surgery have been explained in detail and informed consent is obtained.  The right operative hip has been marked.  05/15/2022 9:01 AM  Merrily Brittle  has presented today for surgery, with the diagnosis of osteoarthritis right hip.  The various methods of treatment have been discussed with the patient and family. After consideration of risks, benefits and other options for treatment, the patient has consented to  Procedure(s): RIGHT TOTAL HIP ARTHROPLASTY ANTERIOR APPROACH (Right) as a surgical intervention.  The patient's history has been reviewed, patient examined, no change in status, stable for surgery.  I have reviewed the patient's chart and labs.  Questions were answered to the patient's satisfaction.     Kathryne Hitch

## 2022-05-15 NOTE — Transfer of Care (Signed)
Immediate Anesthesia Transfer of Care Note  Patient: Wanda Lang  Procedure(s) Performed: RIGHT TOTAL HIP ARTHROPLASTY ANTERIOR APPROACH (Right: Hip)  Patient Location: PACU  Anesthesia Type:General  Level of Consciousness: awake  Airway & Oxygen Therapy: Patient Spontanous Breathing and Patient connected to face mask oxygen  Post-op Assessment: Report given to RN, Post -op Vital signs reviewed and stable, and Patient moving all extremities  Post vital signs: Reviewed and stable  Last Vitals:  Vitals Value Taken Time  BP 134/84 05/15/22 1136  Temp    Pulse 81 05/15/22 1139  Resp 15 05/15/22 1139  SpO2 96 % 05/15/22 1139  Vitals shown include unvalidated device data.  Last Pain:  Vitals:   05/15/22 0902  TempSrc:   PainSc: 7       Patients Stated Pain Goal: 6 (05/15/22 0902)  Complications: No notable events documented.

## 2022-05-15 NOTE — Anesthesia Procedure Notes (Signed)
Procedure Name: LMA Insertion Date/Time: 05/15/2022 10:12 AM  Performed by: Marcene Duos, MDPre-anesthesia Checklist: Patient identified, Emergency Drugs available, Suction available and Patient being monitored Patient Re-evaluated:Patient Re-evaluated prior to induction Oxygen Delivery Method: Circle System Utilized Preoxygenation: Pre-oxygenation with 100% oxygen Induction Type: IV induction Ventilation: Mask ventilation without difficulty LMA: LMA inserted LMA Size: 4.0 Number of attempts: 1 Placement Confirmation: positive ETCO2 Tube secured with: Tape Dental Injury: Teeth and Oropharynx as per pre-operative assessment

## 2022-05-15 NOTE — Anesthesia Procedure Notes (Signed)
Spinal  Patient location during procedure: OR Start time: 05/15/2022 9:23 AM End time: 05/15/2022 9:30 AM Reason for block: surgical anesthesia Staffing Performed: anesthesiologist  Anesthesiologist: Marcene Duos, MD Performed by: Marcene Duos, MD Authorized by: Marcene Duos, MD   Preanesthetic Checklist Completed: patient identified, IV checked, site marked, risks and benefits discussed, surgical consent, monitors and equipment checked, pre-op evaluation and timeout performed Spinal Block Patient position: sitting Prep: DuraPrep Patient monitoring: heart rate, cardiac monitor, continuous pulse ox and blood pressure Approach: midline Location: L3-4 Injection technique: single-shot Needle Needle type: Pencan  Needle gauge: 24 G Needle length: 9 cm Assessment Sensory level: T4 Events: failed spinal and CSF return Additional Notes Free flowing CSF before and after injection LA. Unable to aspirate with syringe but free flowing with taking syringe off needle.

## 2022-05-15 NOTE — Evaluation (Signed)
Physical Therapy Evaluation Patient Details Name: Wanda BrittleCynthia S Kilbourne MRN: [id number] DOB: 01-08-1951 Today's Date: 05/15/2022  History of Present Illness  Pt is a 72 y/o F admitted on 05/15/22 for scheduled R THA. PMH: CAD, fatty liver, GERD, heart murmur, HTN, tremor, RA, morbid obesity, low back pain, L TKA, leg length discrepancy, overactive bladder  Clinical Impression  Pt seen for PT evaluation with pt agreeable to tx, brother present for session. Pt reports she lives alone but her sister plans to stay with her for 1st week upon d/c. PT provided pt with HEP handout & pt performed bed level exercises and LAQ sitting EOB. Pt is able to progress to ambulating short distance in room with RW & min assist but limited by R hip pain with mobility. Anticipate pt will make good progress with functional mobility while in acute setting.       Recommendations for follow up therapy are one component of a multi-disciplinary discharge planning process, led by the attending physician.  Recommendations may be updated based on patient status, additional functional criteria and insurance authorization.  Follow Up Recommendations       Assistance Recommended at Discharge Intermittent Supervision/Assistance  Patient can return home with the following  A little help with walking and/or transfers;A little help with bathing/dressing/bathroom;Assistance with cooking/housework;Assist for transportation;Help with stairs or ramp for entrance    Equipment Recommendations BSC/3in1  Recommendations for Other Services  OT consult    Functional Status Assessment Patient has had a recent decline in their functional status and demonstrates the ability to make significant improvements in function in a reasonable and predictable amount of time.     Precautions / Restrictions Precautions Precautions: Fall Restrictions Weight Bearing Restrictions: No      Mobility  Bed Mobility Overal bed mobility: Needs  Assistance Bed Mobility: Supine to Sit     Supine to sit: Supervision, HOB elevated     General bed mobility comments: Pt uses BUE to assist BLE to EOB, extra time required    Transfers Overall transfer level: Needs assistance Equipment used: Rolling walker (2 wheels) Transfers: Sit to/from Stand, Bed to chair/wheelchair/BSC Sit to Stand: Min assist   Step pivot transfers: Min assist (with RW)       General transfer comment: education re: hand placement during STS, STS from EOB & recliner with min assist.    Ambulation/Gait Ambulation/Gait assistance: Min assist Gait Distance (Feet): 10 Feet Assistive device: Rolling walker (2 wheels) Gait Pattern/deviations: Decreased step length - right, Decreased step length - left, Decreased stride length, Decreased weight shift to right Gait velocity: decreased        Stairs            Wheelchair Mobility    Modified Rankin (Stroke Patients Only)       Balance Overall balance assessment: Needs assistance Sitting-balance support: Feet supported Sitting balance-Leahy Scale: Fair Sitting balance - Comments: supervision static sitting   Standing balance support: Bilateral upper extremity supported, During functional activity, Reliant on assistive device for balance Standing balance-Leahy Scale: Fair                               Pertinent Vitals/Pain Pain Assessment Pain Assessment: 0-10 Pain Score: 7  (7/10 at beginning of session during bed level exercises, increased to 9/10 with gait) Pain Location: R hip Pain Descriptors / Indicators: Discomfort Pain Intervention(s): Monitored during session, Repositioned (provided ice pack for incision at end  of session (requested NT refill ice pack as able))    Home Living Family/patient expects to be discharged to:: Private residence Living Arrangements: Alone Available Help at Discharge: Family (family planning to stay at least 1st week & provide 24 hr  supervision/assist) Type of Home: House Home Access: Other (comment) (Pt with 5-6 steps with L rail but recently had stair lift installed)     Alternate Level Stairs-Number of Steps: 2 steps without rails to access bedroom/bathroom Home Layout: Multi-level Home Equipment: Rolling Walker (2 wheels)      Prior Function Prior Level of Function : Independent/Modified Independent             Mobility Comments: Pt was ambulating with RW, driving.       Hand Dominance        Extremity/Trunk Assessment   Upper Extremity Assessment Upper Extremity Assessment: Overall WFL for tasks assessed    Lower Extremity Assessment Lower Extremity Assessment: RLE deficits/detail RLE Deficits / Details: 3-/5 knee extension in sitting    Cervical / Trunk Assessment Cervical / Trunk Assessment: Normal  Communication   Communication: No difficulties  Cognition Arousal/Alertness: Awake/alert Behavior During Therapy: WFL for tasks assessed/performed Overall Cognitive Status: Within Functional Limits for tasks assessed                                 General Comments: Pleasant, agreeable to tx, follows commands well.        General Comments      Exercises Total Joint Exercises Ankle Circles/Pumps: AROM, Supine, Right, 10 reps Quad Sets: AROM, Supine, Strengthening, Right, 10 reps Short Arc Quad: AROM, Supine, Strengthening, Right, 10 reps Heel Slides: AAROM, Supine, Strengthening, Right, 10 reps Hip ABduction/ADduction: AAROM, Supine, Right, Strengthening, 10 reps Long Arc Quad: AROM, Seated, Strengthening, Right, 10 reps   Assessment/Plan    PT Assessment Patient needs continued PT services  PT Problem List Pain;Decreased strength;Decreased range of motion;Decreased activity tolerance;Decreased mobility;Decreased balance;Decreased knowledge of use of DME       PT Treatment Interventions DME instruction;Therapeutic exercise;Gait training;Balance training;Stair  training;Neuromuscular re-education;Functional mobility training;Therapeutic activities;Patient/family education;Modalities    PT Goals (Current goals can be found in the Care Plan section)  Acute Rehab PT Goals Patient Stated Goal: get better, go home PT Goal Formulation: With patient Time For Goal Achievement: 05/29/22 Potential to Achieve Goals: Good    Frequency 7X/week     Co-evaluation               AM-PAC PT "6 Clicks" Mobility  Outcome Measure Help needed turning from your back to your side while in a flat bed without using bedrails?: A Little Help needed moving from lying on your back to sitting on the side of a flat bed without using bedrails?: A Little Help needed moving to and from a bed to a chair (including a wheelchair)?: A Little Help needed standing up from a chair using your arms (e.g., wheelchair or bedside chair)?: A Little Help needed to walk in hospital room?: A Little Help needed climbing 3-5 steps with a railing? : A Lot 6 Click Score: 17    End of Session Equipment Utilized During Treatment: Gait belt Activity Tolerance: Patient tolerated treatment well;Patient limited by fatigue;Patient limited by pain Patient left: in chair;with chair alarm set;with call bell/phone within reach;with family/visitor present Nurse Communication: Mobility status PT Visit Diagnosis: Unsteadiness on feet (R26.81);Difficulty in walking, not elsewhere classified (R26.2);Muscle weakness (  generalized) (M62.81);Other abnormalities of gait and mobility (R26.89);Pain Pain - Right/Left: Right Pain - part of body: Hip    Time: 0623-7628 PT Time Calculation (min) (ACUTE ONLY): 26 min   Charges:   PT Evaluation $PT Eval Low Complexity: 1 Low PT Treatments $Therapeutic Exercise: 8-22 mins        Aleda Grana, PT, DPT 05/15/22, 4:05 PM   Sandi Mariscal 05/15/2022, 4:04 PM

## 2022-05-15 NOTE — Addendum Note (Signed)
Addended by: Glori Luis on: 05/15/2022 11:33 AM   Modules accepted: Orders

## 2022-05-15 NOTE — Anesthesia Postprocedure Evaluation (Signed)
Anesthesia Post Note  Patient: Wanda Lang  Procedure(s) Performed: RIGHT TOTAL HIP ARTHROPLASTY ANTERIOR APPROACH (Right: Hip)     Patient location during evaluation: PACU Anesthesia Type: General Level of consciousness: awake and alert Pain management: pain level controlled Vital Signs Assessment: post-procedure vital signs reviewed and stable Respiratory status: spontaneous breathing, nonlabored ventilation, respiratory function stable and patient connected to nasal cannula oxygen Cardiovascular status: blood pressure returned to baseline and stable Postop Assessment: no apparent nausea or vomiting Anesthetic complications: no  No notable events documented.  Last Vitals:  Vitals:   05/15/22 1230 05/15/22 1245  BP: (!) 110/91 130/69  Pulse: 77 74  Resp: 12 17  Temp: 36.6 C 36.5 C  SpO2: 94% 100%    Last Pain:  Vitals:   05/15/22 1245  TempSrc: Oral  PainSc: 0-No pain                 Kennieth Rad

## 2022-05-15 NOTE — Op Note (Signed)
Operative Note  Date of operation: 05/15/2022 Preoperative diagnosis: Right hip osteonecrosis with osteoarthritis Postoperative diagnosis: Same  Procedure: Right direct anterior total hip arthroplasty  Implants: Implant Name Type Inv. Item Serial No. Manufacturer Lot No. LRB No. Used Action  CUP SECTOR GRIPTON - [id number] Cup CUP SECTOR GRIPTON  [employer] [id number] Right 1 Implanted  SCREW 6.5MMX25MM - [id number] Screw SCREW 6.5MMX25MM  DEPU[employer] [id number] Right 1 Implanted  PINNACLE PLUS 4 NEUTRAL - [id number] Hips PINNACLE PLUS 4 NEUTRAL  DEPU[employer] [id number] Right 1 Implanted  SCREW 6.5MMX25MM - [id number] Screw SCREW 6.5MMX25MM  [employer]PU[employer] [id number] Right 1 Implanted  STEM FEM ACTIS HIGH SZ2 - [id number] Stem STEM FEM ACTIS HIGH SZ2  DEPU[employer] [id number] Right 1 Implanted  HEAD FEM STD 32X+5 STRL - [id number] Hips HEAD FEM STD 32X+5 STRL  [employer]PU[employer] [id number] Right 1 Implanted   Surgeon: Vanita Panda. Magnus Ivan, MD Assistant: Rexene Edison, PA-C  Anesthesia: #1 attempted spinal, #2 General via LMA EBL: 750 cc Antibiotics: 2 g IV Ancef Complications: None  Indications: The patient is a 72 year old female with debilitating right hip pain with almost inability to ambulate.  Her x-rays show that the femoral head is basically disintegrating within her right hip.  She has had a steroid injection at 1 point and she likely had significant osteoarthritis that is now led to osteonecrosis.  At this point a total hip arthroplasty is warranted.  She knows this will be quite difficult.  Her BMI is over 30 and her hip is significantly deformed.  She has a heightened risk of acute blood loss anemia, nerve or vessel injury, fracture, infection, DVT, implant failure, dislocation, leg length differences and wound healing issues.  Of note she is very short in terms of her leg length on that right side.  The right and left and the muscles are contracted so  we will likely not be able to get her full-length back.  She understands her goals are hopefully decrease pain, improve mobility and improve quality of life.  Procedure description: After informed consent was obtained and the appropriate right hip was marked the patient is brought to the operating room and set up on the operating table where spinal anesthesia was attempted.  It was felt like it was obtained and they were eventually able to get a Foley catheter and then we placed traction boots on both her feet.  She was then placed supine on the Hana fracture table with a perineal post in place in both legs and inline skeletal traction devices no traction applied.  The extreme of external rotation of her right hip did cause some pain but she was given some sedation and so we had her right hip prepped and draped with DuraPrep and sterile drapes and assessed radiographically.  We then really pinch the skin hard and she did not respond to this.  An incision was then made just inferior and posterior to the ASIS once we made the incision though it was obvious that she still had some sensory components and so was felt it was best to proceed with a general anesthesia via LMA.  We then proceeded with the surgery.  We dissected down to the tensor fascia lata muscle and tensor fascia was then divided longitudinally to proceed with direct and approach the hip.  It was difficult to identify the hip capsule essentially due to the fact that her hip was essentially melting away.  We did open it up and she had  a large joint effusion.  There was significant scar tissue all around the hip.  We made a femoral neck cut with an oscillating saw just proximal to the lesser trochanter and completed this an osteotome.  We removed remnants of the femoral head easily.  There was a lot of scar tissue and within the acetabulum took [place] a while to remove all this and this certainly cause more bleeding.  We then were finally able to initiate  reaming and we started with a small reamer at size 43 and stepwise increments under direct visualization and direct fluoroscopy we went up all the way to a 49 reamer.  We felt good with the placement that reamer under direct fluoroscopy with noting our depth of reaming, or inclination and anteversion.  We then placed the real DePuy Sectra GRIPTION acetabular opponent size 50 and placed to acetabular screws.  We then went with a 32+4 polythene liner.  Attention was then turned to the femur.  With the right leg externally rotated to 120 degrees, extended and adducted we were able to place a Mueller retractor medially and a Hohmann retractor behind the greater trochanter.  The lateral joint capsule was released and a box cutting osteotome was used in the femoral canal.  We then broached in sequential increments from a size 0 broach going up to a size 2.  We then trialed a standard offset femoral neck and a 32 +1 trial hip ball and reduce the hip into the acetabulum.  We assessed it radiographically and clinically and if needed more likely and offset.  We dislocated the trial components.  We placed the real Actis femoral component size 2 but one with high offset we went with a 32+5 metal head ball.  This reduced and acetabulum was very tight and difficult to reduce but we did improve her leg length present range of motion and stability as well as offset and with assessment mechanically.  The soft tissue was then irrigated with normal saline solution.  Remnants of joint capsule were closed with interrupted #1 Ethibond suture.  #1 Vicryl was used to close the tensor fascia.  0 Vicryl was used to close the deep tissue and 2-0 Vicryl was used to close the subcutaneous tissue.  Skin was closed with staples.  Aquacel dressing was applied.  The patient was awakened, extubated and taken recovery room in stable condition.  Rexene Edison, PA-C did assist during the entire case and beginning to end and his assistance was crucial and  medically necessary for soft tissue management and retraction, helping guide implant placement and a layered closure of the wound.

## 2022-05-15 NOTE — Plan of Care (Signed)
  Problem: Nutrition: Goal: Adequate nutrition will be maintained Outcome: Progressing   Problem: Pain Managment: Goal: General experience of comfort will improve Outcome: Progressing   

## 2022-05-15 NOTE — Telephone Encounter (Signed)
Noted.  I sent Wanda Lang into the pharmacy for her to try.  If this is not affordable she should let us know.

## 2022-05-15 NOTE — Anesthesia Procedure Notes (Signed)
Procedure Name: MAC Date/Time: 05/15/2022 9:30 AM  Performed by: Jodell Cipro, CRNAPre-anesthesia Checklist: Patient identified, Emergency Drugs available, Suction available, Patient being monitored and Timeout performed Patient Re-evaluated:Patient Re-evaluated prior to induction Oxygen Delivery Method: Simple face mask Placement Confirmation: positive ETCO2 Dental Injury: Teeth and Oropharynx as per pre-operative assessment

## 2022-05-16 ENCOUNTER — Telehealth: Payer: Self-pay | Admitting: Orthopaedic Surgery

## 2022-05-16 DIAGNOSIS — I10 Essential (primary) hypertension: Secondary | ICD-10-CM | POA: Diagnosis not present

## 2022-05-16 DIAGNOSIS — I2510 Atherosclerotic heart disease of native coronary artery without angina pectoris: Secondary | ICD-10-CM | POA: Diagnosis not present

## 2022-05-16 DIAGNOSIS — Z8616 Personal history of COVID-19: Secondary | ICD-10-CM | POA: Diagnosis not present

## 2022-05-16 DIAGNOSIS — Z79899 Other long term (current) drug therapy: Secondary | ICD-10-CM | POA: Diagnosis not present

## 2022-05-16 DIAGNOSIS — M1611 Unilateral primary osteoarthritis, right hip: Secondary | ICD-10-CM | POA: Diagnosis not present

## 2022-05-16 DIAGNOSIS — M87851 Other osteonecrosis, right femur: Secondary | ICD-10-CM | POA: Diagnosis not present

## 2022-05-16 DIAGNOSIS — Z96652 Presence of left artificial knee joint: Secondary | ICD-10-CM | POA: Diagnosis not present

## 2022-05-16 LAB — CBC
HCT: 28.5 % — ABNORMAL LOW (ref 36.0–46.0)
Hemoglobin: 9.7 g/dL — ABNORMAL LOW (ref 12.0–15.0)
MCH: 30.6 pg (ref 26.0–34.0)
MCHC: 34 g/dL (ref 30.0–36.0)
MCV: 89.9 fL (ref 80.0–100.0)
Platelets: 118 10*3/uL — ABNORMAL LOW (ref 150–400)
RBC: 3.17 MIL/uL — ABNORMAL LOW (ref 3.87–5.11)
RDW: 12.5 % (ref 11.5–15.5)
WBC: 6.1 10*3/uL (ref 4.0–10.5)
nRBC: 0 % (ref 0.0–0.2)

## 2022-05-16 LAB — BASIC METABOLIC PANEL
Anion gap: 7 (ref 5–15)
BUN: 12 mg/dL (ref 8–23)
CO2: 28 mmol/L (ref 22–32)
Calcium: 8.3 mg/dL — ABNORMAL LOW (ref 8.9–10.3)
Chloride: 104 mmol/L (ref 98–111)
Creatinine, Ser: 0.8 mg/dL (ref 0.44–1.00)
GFR, Estimated: >60 mL/min (ref >60)
Glucose, Bld: 120 mg/dL — ABNORMAL HIGH (ref 70–99)
Potassium: 4.1 mmol/L (ref 3.5–5.1)
Sodium: 139 mmol/L (ref 135–145)

## 2022-05-16 NOTE — TOC Progression Note (Addendum)
Transition of Care [facility]) - Progression Note    Patient Details  Name: Wanda Lang MRN: [id number] Date of Birth: 05-24-1950  Transition of Care [facility]) CM/SW Contact  Epifanio Lesches, RN Phone Number: 05/16/2022, 1:40 PM  Clinical Narrative:    Pt hoping to transition to home once d/c ready with home health services pending MD's orders/ face to face. Pt without provider preference. Referral made with [facility] for home health services and accepted. DME need, 3in1/BSC. Referral  made with [facility] / [employer]. Equipment will be delivered to bedside prior to d/c.  TOC will continue to monitor and assist with needs....   Expected Discharge Plan: Home w Home Health Services Barriers to Discharge: Continued Medical Work up  Expected Discharge Plan and Services In-house Referral: Clinical Social Work   Post Acute Care Choice: Home Health Living arrangements for the past 2 months: Single Family Home                 DME Arranged: Bedside commode DME Agency: [employer] Date DME Agency Contacted: 05/16/22 Time DME Agency Contacted: 1339 Representative spoke with at DME Agency: Wendi Maya Agency: [facility] Date [facility] Agency Contacted: 05/16/22 Time HH Agency Contacted: 1338 Representative spoke with at [facility] Agency: Tresa Endo   Social Determinants of Health (SDOH) Interventions SDOH Screenings   Food Insecurity: No Food Insecurity (12/05/2021)  Housing: Low Risk  (12/05/2021)  Transportation Needs: No Transportation Needs (12/05/2021)  Depression (PHQ2-9): Low Risk  (04/04/2022)  Financial Resource Strain: Low Risk  (07/13/2021)  Social Connections: Unknown (07/13/2021)  Stress: No Stress Concern Present (07/13/2021)  Tobacco Use: Low Risk  (05/15/2022)    Readmission Risk Interventions     No data to display

## 2022-05-16 NOTE — Progress Notes (Signed)
Subjective: 1 Day Post-Op Procedure(s) (LRB): RIGHT TOTAL HIP ARTHROPLASTY ANTERIOR APPROACH (Right) Patient reports pain as moderate.    Objective: Vital signs in last 24 hours: Temp:  [97.7 F (36.5 C)-98.7 F (37.1 C)] 98.7 F (37.1 C) (04/09 2021) Pulse Rate:  [74-108] 108 (04/09 2021) Resp:  [11-18] 18 (04/09 2021) BP: (105-140)/(52-91) 105/52 (04/09 2021) SpO2:  [94 %-100 %] 96 % (04/09 2021)  Intake/Output from previous day: 04/09 0701 - 04/10 0700 In: 1300 [P.O.:50; I.V.:1000; IV Piggyback:250] Out: 2750 [Urine:2000; Blood:750] Intake/Output this shift: No intake/output data recorded.  Recent Labs    05/16/22 0146  HGB 9.7*   Recent Labs    05/16/22 0146  WBC 6.1  RBC 3.17*  HCT 28.5*  PLT 118*   Recent Labs    05/16/22 0146  NA 139  K 4.1  CL 104  CO2 28  BUN 12  CREATININE 0.80  GLUCOSE 120*  CALCIUM 8.3*   No results for input(s): "LABPT", "INR" in the last 72 hours.  Sensation intact distally Intact pulses distally Dorsiflexion/Plantar flexion intact Incision: scant drainage   Assessment/Plan: 1 Day Post-Op Procedure(s) (LRB): RIGHT TOTAL HIP ARTHROPLASTY ANTERIOR APPROACH (Right) Up with therapy      Kathryne Hitch 05/16/2022, 8:33 AM

## 2022-05-16 NOTE — Progress Notes (Signed)
Physical Therapy Treatment Patient Details Name: Wanda Lang MRN: [id number] DOB: 10-Feb-1950 Today's Date: 05/16/2022   History of Present Illness Pt is a 72 y/o F admitted on 05/15/22 for scheduled R THA. PMH: CAD, fatty liver, GERD, heart murmur, HTN, tremor, RA, morbid obesity, low back pain, L TKA, leg length discrepancy, overactive bladder    PT Comments    Focused on bed mobility, transfer, and gait training this afternoon. Min assist for RLE back into bed. She has been sleeping in a recliner and plans to continue this for a while longer. Increased ambulatory distance to 60 feet with cues for RW use and gait symmetry at a min guard level without overt buckling or LOB. Distance limited by reported pain with WB. Provided post op handout and reviewed LE exercises. States no support at home until Friday, however CM note indicates some assistance from brother available. Will continue to work with and progress patient as tolerated during admission. Encouraged OOB more frequently, declines chair end of session. Ice applied to Rt hip. Patient will continue to benefit from skilled physical therapy services to further improve independence with functional mobility.    Recommendations for follow up therapy are one component of a multi-disciplinary discharge planning process, led by the attending physician.  Recommendations may be updated based on patient status, additional functional criteria and insurance authorization.     Assistance Recommended at Discharge Intermittent Supervision/Assistance  Patient can return home with the following A little help with walking and/or transfers;A little help with bathing/dressing/bathroom;Assistance with cooking/housework;Assist for transportation;Help with stairs or ramp for entrance   Equipment Recommendations  BSC/3in1       Precautions / Restrictions Precautions Precautions: Fall Restrictions Weight Bearing Restrictions: No     Mobility  Bed  Mobility Overal bed mobility: Needs Assistance Bed Mobility: Supine to Sit, Sit to Supine     Supine to sit: HOB elevated, Min guard Sit to supine: Min assist   General bed mobility comments: Min guard for safety, using belt loop for RLE to bring OOB. Requires extra time, minimal cues, no phyical assist from PT. Required additional cues for return to bed, use of belt loop, and min assist to scoot Rt foot over for realignment in bed.    Transfers Overall transfer level: Needs assistance Equipment used: Rolling walker (2 wheels) Transfers: Sit to/from Stand Sit to Stand: Min guard, From elevated surface   Step pivot transfers: Min guard       General transfer comment: Min guard for safety to rise from elevated bed surface. Cues for technique and hand placement. Slow to rise.    Ambulation/Gait Ambulation/Gait assistance: Min guard Gait Distance (Feet): 60 Feet Assistive device: Rolling walker (2 wheels) Gait Pattern/deviations: Decreased stride length, Decreased weight shift to right, Decreased step length - left, Decreased step length - right, Decreased stance time - right, Step-to pattern, Antalgic Gait velocity: decreased Gait velocity interpretation: <1.31 ft/sec, indicative of household ambulator   General Gait Details: Slowly emerging step through gait pattern. Cues for sequencing, min guard for safety. Mild instability and moderate antalgic pattern but without overt buckling noted.   Stairs Stairs: Yes Stairs assistance: Min assist Stair Management: No rails, Step to pattern, Backwards, With walker Number of Stairs: 2 General stair comments: Educated on posterior approach to ascend with RW min assist to block, cues for sequencing. Reviewed alternative methods as well. Has single hand rail on Lt going up at home in her living room but is not required to navigate  this at d/c.   Wheelchair Mobility    Modified Rankin (Stroke Patients Only)       Balance Overall  balance assessment: Needs assistance Sitting-balance support: Feet supported, No upper extremity supported Sitting balance-Leahy Scale: Fair Sitting balance - Comments: supervision static sitting   Standing balance support: During functional activity, Reliant on assistive device for balance, Single extremity supported Standing balance-Leahy Scale: Poor                              Cognition Arousal/Alertness: Awake/alert Behavior During Therapy: WFL for tasks assessed/performed Overall Cognitive Status: Within Functional Limits for tasks assessed                                          Exercises Total Joint Exercises Ankle Circles/Pumps: Supine, 10 reps, Both, AROM Quad Sets: Strengthening, Both, 10 reps, Supine Short Arc Quad: Strengthening, Right, 5 reps, Supine Heel Slides: AROM, Right, 5 reps, Supine Hip ABduction/ADduction: AAROM, Right, 10 reps, Supine Straight Leg Raises: AAROM, Right, 5 reps, Supine Long Arc Quad: AAROM, Strengthening, Right, 5 reps, Seated (unable to ind raise against gravity) Knee Flexion: AROM, Right, 5 reps, Seated    General Comments General comments (skin integrity, edema, etc.): States she does not have supervision from family available until Friday now.      Pertinent Vitals/Pain Pain Assessment Pain Assessment: Faces Faces Pain Scale: Hurts even more Pain Location: R hip Pain Descriptors / Indicators: Discomfort, Moaning, Operative site guarding, Tender, Throbbing, Aching Pain Intervention(s): Monitored during session, Repositioned, Ice applied    Home Living                          Prior Function            PT Goals (current goals can now be found in the care plan section) Acute Rehab PT Goals Patient Stated Goal: get better, go home PT Goal Formulation: With patient Time For Goal Achievement: 05/29/22 Potential to Achieve Goals: Good Progress towards PT goals: Progressing toward goals     Frequency    7X/week      PT Plan Current plan remains appropriate    Co-evaluation              AM-PAC PT "6 Clicks" Mobility   Outcome Measure  Help needed turning from your back to your side while in a flat bed without using bedrails?: A Little Help needed moving from lying on your back to sitting on the side of a flat bed without using bedrails?: A Little Help needed moving to and from a bed to a chair (including a wheelchair)?: A Little Help needed standing up from a chair using your arms (e.g., wheelchair or bedside chair)?: A Little Help needed to walk in hospital room?: A Little Help needed climbing 3-5 steps with a railing? : A Little 6 Click Score: 18    End of Session Equipment Utilized During Treatment: Gait belt Activity Tolerance: Patient tolerated treatment well;Patient limited by pain Patient left: with call bell/phone within reach;with SCD's reapplied;in bed;with bed alarm set;with family/visitor present   PT Visit Diagnosis: Unsteadiness on feet (R26.81);Difficulty in walking, not elsewhere classified (R26.2);Muscle weakness (generalized) (M62.81);Other abnormalities of gait and mobility (R26.89);Pain Pain - Right/Left: Right Pain - part of body: Hip  Time: [id number] PT Time Calculation (min) (ACUTE ONLY): 41 min  Charges:  $Gait Training: 8-22 mins $Therapeutic Exercise: 8-22 mins $Therapeutic Activity: 8-22 mins                     Kathlyn SacramentoLogan Coy Rochford, PT, DPT Physical Therapist Acute Rehabilitation Services [facility]    [place] MountLogan S Ariyon Mittleman 05/16/2022, 4:00 PM

## 2022-05-16 NOTE — TOC Initial Note (Signed)
Transition of Care [facility]) - Initial/Assessment Note    Patient Details  Name: Wanda Lang MRN: [id number] Date of Birth: April 10, 1950  [facility]) CM/SW Contact:    Lorri Frederick, LCSW Phone Number: 05/16/2022, 1:29 PM  Clinical Narrative:      CSW met with pt for initial assessment.  Pt from home alone, brother Onalee Hua in room. Permission given to speak with brother and with sister Jasmine December.  Discussed DC plan and pt has discussed HH services with her provider and is in agreement with that.  Pt reports her sister is coming from out of town to stay with her after DC and will be here Friday.  Brother is also available to help, but not 24/7.  No HH agency preference indicated.  Current DME in home: walker.  Does have walk in shower with bench.  Pt does want 3n1.  PCP in place.               Expected Discharge Plan: Home w Home Health Services Barriers to Discharge: Continued Medical Work up   Patient Goals and CMS Choice Patient states their goals for this hospitalization and ongoing recovery are:: walk and no pain   Choice offered to / list presented to : Patient (No HH agency preference)      Expected Discharge Plan and Services In-house Referral: Clinical Social Work   Post Acute Care Choice: Home Health Living arrangements for the past 2 months: Single Family Home                                      Prior Living Arrangements/Services Living arrangements for the past 2 months: Single Family Home Lives with:: Self Patient language and need for interpreter reviewed:: Yes Do you feel safe going back to the place where you live?: Yes      Need for Family Participation in Patient Care: No (Comment) Care giver support system in place?: Yes (comment) Current home services: Other (comment) (none) Criminal Activity/Legal Involvement Pertinent to Current Situation/Hospitalization: No - Comment as needed  Activities of Daily Living Home Assistive  Devices/Equipment: Walker (specify type), Built-in shower seat ADL Screening (condition at time of admission) Patient's cognitive ability adequate to safely complete daily activities?: No Is the patient deaf or have difficulty hearing?: No Does the patient have difficulty seeing, even when wearing glasses/contacts?: No Does the patient have difficulty concentrating, remembering, or making decisions?: No Patient able to express need for assistance with ADLs?: Yes Does the patient have difficulty dressing or bathing?: Yes Independently performs ADLs?: Yes (appropriate for developmental age) Does the patient have difficulty walking or climbing stairs?: Yes Weakness of Legs: Right Weakness of Arms/Hands: None  Permission Sought/Granted Permission sought to share information with : Family Supports Permission granted to share information with : Yes, Verbal Permission Granted  Share Information with NAME: sister Jasmine December, brother Onalee Hua           Emotional Assessment Appearance:: Appears stated age Attitude/Demeanor/Rapport: Engaged Affect (typically observed): Appropriate, Pleasant Orientation: : Oriented to Self, Oriented to Place, Oriented to  Time, Oriented to Situation      Admission diagnosis:  Status post total replacement of right hip [Z96.641] Patient Active Problem List   Diagnosis Date Noted   Status post total replacement of right hip 05/15/2022   Overactive bladder 04/04/2022   Unilateral primary osteoarthritis, right hip 03/20/2022   Dysuria 01/07/2022  Postmenopausal estrogen deficiency 11/13/2021   Leg length inequality 11/06/2021   Impaired ambulation 10/23/2021   Contact dermatitis 10/16/2021   SI joint arthritis 08/24/2021   Spinal stenosis, lumbar region, without neurogenic claudication 08/24/2021   Degeneration of lumbar intervertebral disc 07/24/2021   Stiffness of left knee 07/21/2021   S/P total knee arthroplasty, left 07/18/2021   Preop examination  07/07/2021   Right hip pain 07/07/2021   Low back pain 07/07/2021   Drug-induced immunodeficiency 06/12/2021   Impingement syndrome of right shoulder region 06/12/2021   Preoperative evaluation of a medical condition to rule out surgical contraindications (TAR required) 06/08/2021   COVID-19 01/24/2021   Hammer toe 04/22/2020   Metatarsalgia of left foot 04/22/2020   Osteoarthritis of midfoot 04/22/2020   Duodenal ulcer 11/12/2019   Stress 11/12/2019   Morbid obesity 11/06/2019   Pain in left knee 07/30/2019   Aortic stenosis, moderate 02/06/2018   Maxillary sinusitis, chronic 10/24/2017   Chronic diarrhea 10/09/2017   Acid reflux 07/15/2017   Asymptomatic carotid artery stenosis 07/15/2017   Cyst of skin 07/15/2017   Immunocompromised 11/09/2016   Allergic rhinitis 11/02/2016   Benign essential tremor 11/02/2016   History of hypertension 11/02/2016   Postmenopausal symptoms 07/01/2015   Seasonal allergic rhinitis due to pollen 07/01/2015   Fatty liver 09/08/2007   Rheumatoid arthritis 09/08/2007   PCP:  Glori Luis, MD Pharmacy:   [facility] - [place], [place] - [address] [place] [address] Phone: [phone] Fax: [phone]  [id number] - [place], [place] - [address] [place] [address] Phone: [phone] Fax: [phone]     Social Determinants of Health (SDOH) Social History: SDOH Screenings   Food Insecurity: No Food Insecurity (12/05/2021)  Housing: Low Risk  (12/05/2021)  Transportation Needs: No Transportation Needs (12/05/2021)  Depression (PHQ2-9): Low Risk  (04/04/2022)  Financial Resource Strain: Low Risk  (07/13/2021)  Social Connections: Unknown (07/13/2021)  Stress: No Stress Concern Present (07/13/2021)  Tobacco Use: Low Risk  (05/15/2022)   SDOH Interventions:     Readmission Risk Interventions     No data to display

## 2022-05-16 NOTE — Progress Notes (Signed)
Physical Therapy Treatment Patient Details Name: Wanda Lang MRN: [id number] DOB: 02/25/1950 Today's Date: 05/16/2022   History of Present Illness Pt is a 72 y/o F admitted on 05/15/22 for scheduled R THA. PMH: CAD, fatty liver, GERD, heart murmur, HTN, tremor, RA, morbid obesity, low back pain, L TKA, leg length discrepancy, overactive bladder    PT Comments    Making progress towards acute functional goals. Navigated 2 steps with min assist for walker block, reviewed other methods as well. Short distance gait up to 22 feet, distance limited by pain however showing good RW control, no buckling, and symmetry improved with verbal cues. Able to mobilize in bed and perform sit<>stand from 2 different surfaces without physical assistance however required extra time and a few cues. Pt reports no assistance at home until Thursday now. Patient will continue to benefit from skilled physical therapy services to further improve independence with functional mobility.   Recommendations for follow up therapy are one component of a multi-disciplinary discharge planning process, led by the attending physician.  Recommendations may be updated based on patient status, additional functional criteria and insurance authorization.     Assistance Recommended at Discharge Intermittent Supervision/Assistance  Patient can return home with the following A little help with walking and/or transfers;A little help with bathing/dressing/bathroom;Assistance with cooking/housework;Assist for transportation;Help with stairs or ramp for entrance   Equipment Recommendations  BSC/3in1       Precautions / Restrictions Precautions Precautions: Fall Restrictions Weight Bearing Restrictions: No     Mobility  Bed Mobility Overal bed mobility: Needs Assistance Bed Mobility: Supine to Sit     Supine to sit: HOB elevated, Min guard     General bed mobility comments: Min guard for safety, using belt loop for RLE to bring  OOB. Requires extra time, minimal cues, no phyical assist from PT.    Transfers Overall transfer level: Needs assistance Equipment used: Rolling walker (2 wheels) Transfers: Sit to/from Stand, Bed to chair/wheelchair/BSC Sit to Stand: Min guard, From elevated surface   Step pivot transfers: Min guard       General transfer comment: Min guard for safety. Cues for hand placement. Tried a couple of techniques for repetition. Slow to rise but stable with support from RW.    Ambulation/Gait Ambulation/Gait assistance: Min guard Gait Distance (Feet): 22 Feet Assistive device: Rolling walker (2 wheels) Gait Pattern/deviations: Decreased stride length, Decreased weight shift to right, Decreased step length - left, Decreased step length - right, Decreased stance time - right, Step-to pattern, Antalgic Gait velocity: decreased Gait velocity interpretation: <1.31 ft/sec, indicative of household ambulator   General Gait Details: Educated sequencing and AD use with RW. Cues for weight shift to unload as needed and for UE support. No overt buckling noted. Step-to pattern. Declines further distance 2/2 pain.   Stairs Stairs: Yes Stairs assistance: Min assist Stair Management: No rails, Step to pattern, Backwards, With walker Number of Stairs: 2 General stair comments: Educated on posterior approach to ascend with RW min assist to block, cues for sequencing. Reviewed alternative methods as well. Has single hand rail on Lt going up at home in her living room but is not required to navigate this at d/c.   Wheelchair Mobility    Modified Rankin (Stroke Patients Only)       Balance Overall balance assessment: Needs assistance Sitting-balance support: Feet supported Sitting balance-Leahy Scale: Fair Sitting balance - Comments: supervision static sitting   Standing balance support: During functional activity, Reliant on assistive device  for balance, Single extremity supported Standing  balance-Leahy Scale: Poor                              Cognition Arousal/Alertness: Awake/alert Behavior During Therapy: WFL for tasks assessed/performed Overall Cognitive Status: Within Functional Limits for tasks assessed                                          Exercises      General Comments General comments (skin integrity, edema, etc.): EOB BP 76/61 (65) symptomatic; Supine 103/61 (74) asymptomatic      Pertinent Vitals/Pain Pain Assessment Pain Assessment: Faces Faces Pain Scale: Hurts whole lot Pain Location: R hip Pain Descriptors / Indicators: Discomfort, Moaning, Operative site guarding, Tender, Throbbing, Aching Pain Intervention(s): Limited activity within patient's tolerance, Monitored during session, Repositioned    Home Living Family/patient expects to be discharged to:: Private residence Living Arrangements: Alone Available Help at Discharge: Family (sister to stay 1-1.5 weeks to provide assist as needed) Type of Home: House Home Access: Other (comment) (Pt with 5-6 steps with L rail but recently had stair lift installed)     Alternate Level Stairs-Number of Steps: 2 steps without rails to access bedroom/bathroom Home Layout: Multi-level Home Equipment: Agricultural consultantolling Walker (2 wheels);Adaptive equipment (lift recliner chair) Additional Comments: walking stick    Prior Function            PT Goals (current goals can now be found in the care plan section) Acute Rehab PT Goals Patient Stated Goal: get better, go home PT Goal Formulation: With patient Time For Goal Achievement: 05/29/22 Potential to Achieve Goals: Good Progress towards PT goals: Progressing toward goals    Frequency    7X/week      PT Plan Current plan remains appropriate    Co-evaluation              AM-PAC PT "6 Clicks" Mobility   Outcome Measure  Help needed turning from your back to your side while in a flat bed without using bedrails?: A  Little Help needed moving from lying on your back to sitting on the side of a flat bed without using bedrails?: A Little Help needed moving to and from a bed to a chair (including a wheelchair)?: A Little Help needed standing up from a chair using your arms (e.g., wheelchair or bedside chair)?: A Little Help needed to walk in hospital room?: A Little Help needed climbing 3-5 steps with a railing? : A Little 6 Click Score: 18    End of Session Equipment Utilized During Treatment: Gait belt Activity Tolerance: Patient tolerated treatment well;Patient limited by pain Patient left: in chair;with chair alarm set;with call bell/phone within reach;with SCD's reapplied Nurse Communication: Mobility status PT Visit Diagnosis: Unsteadiness on feet (R26.81);Difficulty in walking, not elsewhere classified (R26.2);Muscle weakness (generalized) (M62.81);Other abnormalities of gait and mobility (R26.89);Pain Pain - Right/Left: Right Pain - part of body: Hip     Time: [id number] PT Time Calculation (min) (ACUTE ONLY): 36 min  Charges:  $Gait Training: 8-22 mins $Therapeutic Activity: 8-22 mins                     Kathlyn SacramentoLogan Jeriel Vivanco, PT, DPT Physical Therapist Acute Rehabilitation [facility]  Berton Mount 05/16/2022, 12:51 PM

## 2022-05-16 NOTE — Care Plan (Signed)
Ortho Bundle Case Management Note  Patient Details  Name: Wanda Lang MRN: [id number] Date of Birth: [birth year]   Spoke with patient prior to surgery regarding her upcoming Right total hip arthroplasty. She is an Ortho bundle through [facility]. She has a RW, but would most likely need a 3in1/BSC. Mistakenly documented in chart that referral made to [facility] for anticipate HHPT needs, but referral made to [facility] by office staff. Reviewed post op care instructions. Her sister is coming to assist with needs at discharge and plan currently is to return home with assistance from her family.                   DME Arranged:  3-N-1 (Has rolling walker at home) DME Agency:  [employer]  HH Arranged:  PT HH Agency:  [facility]  Additional Comments: Please contact me with any questions of if this plan should need to change.  Ralph Dowdy, RN, BSN, [employer]  [phone] 05/16/2022, 4:41 PM

## 2022-05-16 NOTE — Evaluation (Signed)
Occupational Therapy Evaluation Patient Details Name: Wanda BrittleCynthia S Mark MRN: [id number] DOB: 04-Feb-1951 Today's Date: 05/16/2022   History of Present Illness Pt is a 72 y/o F admitted on 05/15/22 for scheduled R THA. PMH: CAD, fatty liver, GERD, heart murmur, HTN, tremor, RA, morbid obesity, low back pain, L TKA, leg length discrepancy, overactive bladder   Clinical Impression   PTA, pt lived alone and was mod I. Upon eval, pt performing UB ADL with set-up and LB ADL with up to min A. Pt has AE at home for LB ADL and feels confident using it. Pt able to perform bed mobility with min-mod A this session and basic transfers with RW with min guard- min A. Pt sister to stay with her for one week upon return home. Will continue to follow acutely to optimize pt independence and decrease caregiver burden.     Recommendations for follow up therapy are one component of a multi-disciplinary discharge planning process, led by the attending physician.  Recommendations may be updated based on patient status, additional functional criteria and insurance authorization.   Assistance Recommended at Discharge Intermittent Supervision/Assistance  Patient can return home with the following A little help with walking and/or transfers;A little help with bathing/dressing/bathroom;Assistance with cooking/housework;Assist for transportation;Help with stairs or ramp for entrance    Functional Status Assessment  Patient has had a recent decline in their functional status and demonstrates the ability to make significant improvements in function in a reasonable and predictable amount of time.  Equipment Recommendations  BSC/3in1    Recommendations for Other Services       Precautions / Restrictions Precautions Precautions: Fall Restrictions Weight Bearing Restrictions: No      Mobility Bed Mobility Overal bed mobility: Needs Assistance Bed Mobility: Supine to Sit, Sit to Supine     Supine to sit: Min assist,  HOB elevated Sit to supine: Mod assist   General bed mobility comments: Pt using BUE and gait belt to assist BLE to EOB, however, difficulty adducting RLE, so required min A. Pt required mod A to bring BLE back into bed and reposition.    Transfers Overall transfer level: Needs assistance Equipment used: Rolling walker (2 wheels) Transfers: Sit to/from Stand, Bed to chair/wheelchair/BSC Sit to Stand: Min guard, From elevated surface     Step pivot transfers: Min assist     General transfer comment: cues for hand placement      Balance Overall balance assessment: Needs assistance Sitting-balance support: Feet supported Sitting balance-Leahy Scale: Fair Sitting balance - Comments: supervision static sitting   Standing balance support: Bilateral upper extremity supported, During functional activity, Reliant on assistive device for balance Standing balance-Leahy Scale: Fair                             ADL either performed or assessed with clinical judgement   ADL Overall ADL's : Needs assistance/impaired Eating/Feeding: Modified independent;Bed level Eating/Feeding Details (indicate cue type and reason): eating breakfast on departure Grooming: Min guard;Standing;Minimal assistance   Upper Body Bathing: Set up;Sitting   Lower Body Bathing: Minimal assistance;Sit to/from stand   Upper Body Dressing : Set up;Sitting   Lower Body Dressing: Minimal assistance;Sit to/from stand Lower Body Dressing Details (indicate cue type and reason): predominantly reliant on BUE for standing balance. Toilet Transfer: Min guard;Stand-pivot;Rolling walker (2 wheels);BSC/3in1 StatisticianToilet Transfer Details (indicate cue type and reason): Unable to ambulate this session due to pain and soft BP Toileting- Clothing Manipulation and  Hygiene: Set up;Sitting/lateral lean     Tub/Shower Transfer Details (indicate cue type and reason): Limited by pain and soft BP; plan to attempt next  session Functional mobility during ADLs: Min guard;Minimal assistance;Rolling walker (2 wheels)       Vision Baseline Vision/History: 1 Wears glasses Ability to See in Adequate Light: 0 Adequate Patient Visual Report: No change from baseline Vision Assessment?: No apparent visual deficits     Perception Perception Perception Tested?: No   Praxis Praxis Praxis tested?: Within functional limits    Pertinent Vitals/Pain Pain Assessment Pain Assessment: Faces Faces Pain Scale: Hurts whole lot Pain Location: R hip Pain Descriptors / Indicators: Discomfort, Moaning, Operative site guarding, Tender, Throbbing, Aching Pain Intervention(s): Limited activity within patient's tolerance, Monitored during session, Premedicated before session     Hand Dominance     Extremity/Trunk Assessment Upper Extremity Assessment Upper Extremity Assessment: Generalized weakness   Lower Extremity Assessment Lower Extremity Assessment: Defer to PT evaluation   Cervical / Trunk Assessment Cervical / Trunk Assessment: Normal   Communication Communication Communication: No difficulties   Cognition Arousal/Alertness: Awake/alert Behavior During Therapy: WFL for tasks assessed/performed Overall Cognitive Status: Within Functional Limits for tasks assessed                                 General Comments: Pleasant, agreeable to tx, follows commands well. Reports that she has been disoriented since surgery; initally thought it was night time this morning. Reporting it was tuesday, but able to self correct with one indirect cue.     General Comments  EOB BP 76/61 (65) symptomatic; Supine 103/61 (74) asymptomatic    Exercises     Shoulder Instructions      Home Living Family/patient expects to be discharged to:: Private residence Living Arrangements: Alone Available Help at Discharge: Family (sister to stay 1-1.5 weeks to provide assist as needed) Type of Home: House Home  Access: Other (comment) (Pt with 5-6 steps with L rail but recently had stair lift installed)     Home Layout: Multi-level Alternate Level Stairs-Number of Steps: 2 steps without rails to access bedroom/bathroom Alternate Level Stairs-Rails: None           Home Equipment: Rolling Walker (2 wheels);Adaptive equipment (lift recliner chair) Adaptive Equipment: Reacher;Sock aid;Long-handled shoe horn;Other (Comment) (leg lifter) Additional Comments: walking stick      Prior Functioning/Environment Prior Level of Function : Independent/Modified Independent             Mobility Comments: Pt was ambulating with RW, driving. ADLs Comments: Pt reports she has been predominantly sedentary; only accesses community for doctor appointments; gets grocery delivery recently due to poor activity tolerance. Pt reports she is independent in ADL, but recently has been sleeping in her lift chair recliner due to difficulty with bed mobility        OT Problem List: Decreased strength;Impaired balance (sitting and/or standing);Decreased range of motion;Decreased activity tolerance;Decreased knowledge of use of DME or AE;Decreased safety awareness;Decreased knowledge of precautions;Pain      OT Treatment/Interventions: Self-care/ADL training;Therapeutic exercise;DME and/or AE instruction;Balance training;Patient/family education;Therapeutic activities    OT Goals(Current goals can be found in the care plan section) Acute Rehab OT Goals Patient Stated Goal: get better OT Goal Formulation: With patient Time For Goal Achievement: 05/30/22 Potential to Achieve Goals: Good  OT Frequency: Min 2X/week    Co-evaluation  AM-PAC OT "6 Clicks" Daily Activity     Outcome Measure Help from another person eating meals?: None Help from another person taking care of personal grooming?: A Little Help from another person toileting, which includes using toliet, bedpan, or urinal?: A Little Help  from another person bathing (including washing, rinsing, drying)?: A Little Help from another person to put on and taking off regular upper body clothing?: A Little Help from another person to put on and taking off regular lower body clothing?: A Little (with AE) 6 Click Score: 19   End of Session Equipment Utilized During Treatment: Gait belt;Rolling walker (2 wheels) Nurse Communication: Mobility status  Activity Tolerance: Patient tolerated treatment well;Patient limited by pain;Treatment limited secondary to medical complications (Comment) (soft BPs) Patient left: in bed;with call bell/phone within reach;with bed alarm set  OT Visit Diagnosis: Unsteadiness on feet (R26.81);Muscle weakness (generalized) (M62.81);Other abnormalities of gait and mobility (R26.89);Pain Pain - Right/Left: Right Pain - part of body: Hip                Time: 7106-2694 OT Time Calculation (min): 39 min Charges:  OT General Charges $OT Visit: 1 Visit OT Evaluation $OT Eval Low Complexity: 1 Low OT Treatments $Self Care/Home Management : 23-37 mins  Tyler Deis, OTR/L [facility] Acute Rehabilitation Office: [phone]   Myrla Halsted 05/16/2022, 9:41 AM

## 2022-05-16 NOTE — Plan of Care (Signed)
  Problem: Education: Goal: Knowledge of General Education information will improve Description: Including pain rating scale, medication(s)/side effects and non-pharmacologic comfort measures Outcome: Progressing   Problem: Health Behavior/Discharge Planning: Goal: Ability to manage health-related needs will improve Outcome: Progressing   Problem: Clinical Measurements: Goal: Will remain free from infection Outcome: Progressing   Problem: Activity: Goal: Risk for activity intolerance will decrease Outcome: Progressing   Problem: Pain Managment: Goal: General experience of comfort will improve Outcome: Progressing   Problem: Safety: Goal: Ability to remain free from injury will improve Outcome: Progressing   

## 2022-05-16 NOTE — Plan of Care (Signed)
  Problem: Clinical Measurements: Goal: Will remain free from infection Outcome: Progressing   Problem: Activity: Goal: Risk for activity intolerance will decrease Outcome: Progressing   

## 2022-05-16 NOTE — Telephone Encounter (Signed)
Pt called requesting a call back from Dr Magnus Ivan or PA Chestine Spore. Pt states she is to be discharged Thursday after hip replacement yesterday. Pt states her sister was to come and help her but her sister is unable to ome care for her and she has no one to go home to to help her. Please call pt about this matter at [phone]

## 2022-05-16 NOTE — Plan of Care (Signed)
  Problem: Education: Goal: Knowledge of General Education information will improve Description: Including pain rating scale, medication(s)/side effects and non-pharmacologic comfort measures Outcome: Progressing   Problem: Health Behavior/Discharge Planning: Goal: Ability to manage health-related needs will improve Outcome: Progressing   Problem: Clinical Measurements: Goal: Ability to maintain clinical measurements within normal limits will improve Outcome: Progressing Goal: Will remain free from infection Outcome: Progressing Goal: Diagnostic test results will improve Outcome: Progressing Goal: Cardiovascular complication will be avoided Outcome: Completed/Met   Problem: Activity: Goal: Risk for activity intolerance will decrease Outcome: Progressing   Problem: Nutrition: Goal: Adequate nutrition will be maintained Outcome: Progressing   Problem: Coping: Goal: Level of anxiety will decrease Outcome: Progressing   Problem: Elimination: Goal: Will not experience complications related to bowel motility Outcome: Progressing Goal: Will not experience complications related to urinary retention Outcome: Progressing   Problem: Pain Managment: Goal: General experience of comfort will improve Outcome: Progressing   Problem: Safety: Goal: Ability to remain free from injury will improve Outcome: Progressing   Problem: Skin Integrity: Goal: Risk for impaired skin integrity will decrease Outcome: Progressing   Problem: Education: Goal: Knowledge of the prescribed therapeutic regimen will improve Outcome: Progressing Goal: Understanding of discharge needs will improve Outcome: Progressing   Problem: Clinical Measurements: Goal: Postoperative complications will be avoided or minimized Outcome: Progressing   Problem: Pain Management: Goal: Pain level will decrease with appropriate interventions Outcome: Progressing

## 2022-05-16 NOTE — Care Management Obs Status (Signed)
MEDICARE OBSERVATION STATUS NOTIFICATION   Patient Details  Name: Wanda Lang MRN: [id number] Date of Birth: 10-Oct-1950   Medicare Observation Status Notification Given:  Yes    Lorri Frederick, LCSW 05/16/2022, 1:18 PM

## 2022-05-17 ENCOUNTER — Telehealth: Payer: Self-pay

## 2022-05-17 DIAGNOSIS — M87851 Other osteonecrosis, right femur: Secondary | ICD-10-CM | POA: Diagnosis not present

## 2022-05-17 DIAGNOSIS — Z8616 Personal history of COVID-19: Secondary | ICD-10-CM | POA: Diagnosis not present

## 2022-05-17 DIAGNOSIS — Z96649 Presence of unspecified artificial hip joint: Secondary | ICD-10-CM

## 2022-05-17 DIAGNOSIS — M1611 Unilateral primary osteoarthritis, right hip: Secondary | ICD-10-CM | POA: Diagnosis not present

## 2022-05-17 DIAGNOSIS — I2510 Atherosclerotic heart disease of native coronary artery without angina pectoris: Secondary | ICD-10-CM | POA: Diagnosis not present

## 2022-05-17 DIAGNOSIS — I10 Essential (primary) hypertension: Secondary | ICD-10-CM | POA: Diagnosis not present

## 2022-05-17 DIAGNOSIS — Z96652 Presence of left artificial knee joint: Secondary | ICD-10-CM | POA: Diagnosis not present

## 2022-05-17 DIAGNOSIS — Z79899 Other long term (current) drug therapy: Secondary | ICD-10-CM | POA: Diagnosis not present

## 2022-05-17 LAB — CBC
HCT: 27.8 % — ABNORMAL LOW (ref 36.0–46.0)
Hemoglobin: 9.4 g/dL — ABNORMAL LOW (ref 12.0–15.0)
MCH: 30.2 pg (ref 26.0–34.0)
MCHC: 33.8 g/dL (ref 30.0–36.0)
MCV: 89.4 fL (ref 80.0–100.0)
Platelets: 105 10*3/uL — ABNORMAL LOW (ref 150–400)
RBC: 3.11 MIL/uL — ABNORMAL LOW (ref 3.87–5.11)
RDW: 12.8 % (ref 11.5–15.5)
WBC: 8.9 10*3/uL (ref 4.0–10.5)
nRBC: 0 % (ref 0.0–0.2)

## 2022-05-17 NOTE — Progress Notes (Signed)
Physical Therapy Treatment Patient Details Name: Wanda Lang MRN: [id number] DOB: 1950-08-08 Today's Date: 05/17/2022   History of Present Illness Pt is a 72 y/o F admitted on 05/15/22 for scheduled R THA. PMH: CAD, fatty liver, GERD, heart murmur, HTN, tremor, RA, morbid obesity, low back pain, L TKA, leg length discrepancy, overactive bladder    PT Comments    Pt was received in supine and agreeable to session. Pt was able to perform bed mobility with increased time and light min A to keep RLE from sliding off the EOB when returning to supine. Pt able to perform 2 stair trials with min A for balance due to RLE pain. Pt reports no rails at home, only a post she can hold onto. Pt also reports that she will need to navigate down 2 stairs to access her recliner and up 2 stairs to the kitchen and bathroom. Due to level of assist needed to navigate stairs at this time, pt would benefit from continued PT to be able to safely perform without assist before returning home. Pt was only able to tolerate a short gait trial in room due to fatigue after stair trial. Pt is motivated to participate in mobility tasks, however is limited by R hip pain. Pt continues to benefit from PT services to progress toward functional mobility goals.    Recommendations for follow up therapy are one component of a multi-disciplinary discharge planning process, led by the attending physician.  Recommendations may be updated based on patient status, additional functional criteria and insurance authorization.  Follow Up Recommendations       Assistance Recommended at Discharge Intermittent Supervision/Assistance  Patient can return home with the following A little help with walking and/or transfers;A little help with bathing/dressing/bathroom;Assistance with cooking/housework;Assist for transportation;Help with stairs or ramp for entrance   Equipment Recommendations  BSC/3in1    Recommendations for Other Services        Precautions / Restrictions Precautions Precautions: Fall Restrictions Weight Bearing Restrictions: No     Mobility  Bed Mobility Overal bed mobility: Needs Assistance Bed Mobility: Supine to Sit, Sit to Supine     Supine to sit: Min guard, HOB elevated Sit to supine: Min assist   General bed mobility comments: Increased time and min A to assist RLE into bed. Pt able to use gait belt for RLE management, but demonstrates difficulty moving RLE far enough on the EOB that it doesn't slide off.    Transfers Overall transfer level: Needs assistance Equipment used: Rolling walker (2 wheels) Transfers: Sit to/from Stand, Bed to chair/wheelchair/BSC Sit to Stand: Min guard   Step pivot transfers: Min guard       General transfer comment: from EOB x1 and recliner x2 with min guard for safety. Pt demonstrating safe hand placement and good power up.    Ambulation/Gait Ambulation/Gait assistance: Min guard Gait Distance (Feet): 15 Feet Assistive device: Rolling walker (2 wheels) Gait Pattern/deviations: Decreased stride length, Step-through pattern, Antalgic, Trunk flexed, Decreased stance time - right       General Gait Details: Slow, antalgic, step-through pattern with cues for upright posture. Increased reliance on BUE support on RW to limit RLE WB. min guard for safety. Distance limited by fatigue after stair trial.   Stairs Stairs: Yes Stairs assistance: Min assist Stair Management: With walker, One rail Right, Step to pattern, Backwards, Sideways Number of Stairs: 2 (x2) General stair comments: Pt reporting that she needs to navigate stairs to access recliner and kitchen/bathroom. Pt requiring  min A for balance due to RLE pain. Cues for sequence and technique.      Balance Overall balance assessment: Needs assistance Sitting-balance support: Feet supported, No upper extremity supported Sitting balance-Leahy Scale: Good Sitting balance - Comments: sitting EOB    Standing balance support: During functional activity, Reliant on assistive device for balance, Single extremity supported Standing balance-Leahy Scale: Poor Standing balance comment: with RW support                            Cognition Arousal/Alertness: Awake/alert Behavior During Therapy: WFL for tasks assessed/performed Overall Cognitive Status: Within Functional Limits for tasks assessed                                          Exercises      General Comments        Pertinent Vitals/Pain Pain Assessment Pain Assessment: 0-10 Pain Score: 5  Pain Location: R hip Pain Descriptors / Indicators: Aching, Discomfort, Operative site guarding, Grimacing Pain Intervention(s): Limited activity within patient's tolerance, Monitored during session     PT Goals (current goals can now be found in the care plan section) Acute Rehab PT Goals Patient Stated Goal: get better, go home PT Goal Formulation: With patient Time For Goal Achievement: 05/29/22 Potential to Achieve Goals: Good Progress towards PT goals: Progressing toward goals    Frequency    7X/week      PT Plan Current plan remains appropriate       AM-PAC PT "6 Clicks" Mobility   Outcome Measure  Help needed turning from your back to your side while in a flat bed without using bedrails?: A Little Help needed moving from lying on your back to sitting on the side of a flat bed without using bedrails?: A Little Help needed moving to and from a bed to a chair (including a wheelchair)?: A Little Help needed standing up from a chair using your arms (e.g., wheelchair or bedside chair)?: A Little Help needed to walk in hospital room?: A Little Help needed climbing 3-5 steps with a railing? : A Little 6 Click Score: 18    End of Session Equipment Utilized During Treatment: Gait belt Activity Tolerance: Patient limited by pain Patient left: with call bell/phone within reach;in bed;with  bed alarm set Nurse Communication: Mobility status PT Visit Diagnosis: Unsteadiness on feet (R26.81);Difficulty in walking, not elsewhere classified (R26.2);Muscle weakness (generalized) (M62.81);Other abnormalities of gait and mobility (R26.89);Pain     Time: 1411-1454 PT Time Calculation (min) (ACUTE ONLY): 43 min  Charges:  $Gait Training: 23-37 mins $Therapeutic Activity: 8-22 mins                     Johny Shock, PTA Acute Rehabilitation Services Secure Chat Preferred  [phone]    Johny Shock 05/17/2022, 3:18 PM

## 2022-05-17 NOTE — Discharge Instructions (Signed)

## 2022-05-17 NOTE — Progress Notes (Signed)
Occupational Therapy Treatment Patient Details Name: Wanda Lang MRN: [id number] DOB: July 12, 1950 Today's Date: 05/17/2022   History of present illness Pt is a 72 y/o F admitted on 05/15/22 for scheduled R THA. PMH: CAD, fatty liver, GERD, heart murmur, HTN, tremor, RA, morbid obesity, low back pain, L TKA, leg length discrepancy, overactive bladder   OT comments  Pt progressing toward established OT goals. Pt performing bed mobility with min A and shower transfer with min guard A. Pt with slowed pace of movement and decreased knowledge of safety with transfers. Pt reports that her sister cannot be with her until tomorrow evening (4/12). Pt presenting with poor balance and pain resulting in increased fall risk. Recommending postpone discharge home until sister is able to be with her and one more day of therapy acutely; otherwise, pt may require short term inpatient rehabilitation to optimize safety and independence prior to discharge home.    Recommendations for follow up therapy are one component of a multi-disciplinary discharge planning process, led by the attending physician.  Recommendations may be updated based on patient status, additional functional criteria and insurance authorization.    Assistance Recommended at Discharge Intermittent Supervision/Assistance  Patient can return home with the following  A little help with walking and/or transfers;A little help with bathing/dressing/bathroom;Assistance with cooking/housework;Assist for transportation;Help with stairs or ramp for entrance   Equipment Recommendations  BSC/3in1    Recommendations for Other Services      Precautions / Restrictions Precautions Precautions: Fall Restrictions Weight Bearing Restrictions: No       Mobility Bed Mobility Overal bed mobility: Needs Assistance Bed Mobility: Supine to Sit, Sit to Supine     Supine to sit: HOB elevated, Min guard Sit to supine: Min assist   General bed mobility  comments: Min guard A and significantly increased time to come to EOB, but no hands on A needed. Min A to bring BLE into bed; educated pt regarding how her family can assist with proper body mechanics in the home setting and pt verbalizing underestanding    Transfers Overall transfer level: Needs assistance Equipment used: Rolling walker (2 wheels) Transfers: Sit to/from Stand Sit to Stand: Min guard, From elevated surface     Step pivot transfers: Min guard     General transfer comment: Min guard for safety; less reliant on cues for technique/hand position this session.     Balance Overall balance assessment: Needs assistance Sitting-balance support: Feet supported, No upper extremity supported Sitting balance-Leahy Scale: Good     Standing balance support: During functional activity, Reliant on assistive device for balance, Single extremity supported Standing balance-Leahy Scale: Poor Standing balance comment: reliant on RW                           ADL either performed or assessed with clinical judgement   ADL Overall ADL's : Needs assistance/impaired                     Lower Body Dressing: Min guard;With adaptive equipment;Sit to/from stand Lower Body Dressing Details (indicate cue type and reason): reacher to don new brief Toilet Transfer: Min guard;Stand-pivot;Rolling walker (2 wheels);BSC/3in1 Toilet Transfer Details (indicate cue type and reason): for transfer to/from [facility] this session     Tub/ Shower Transfer: Walk-in shower;Min guard;Ambulation;Rolling walker (2 wheels);Shower Field seismologist Details (indicate cue type and reason): Min guard A and min cues for safety Functional mobility during ADLs: Min guard;Rolling walker (  2 wheels) General ADL Comments: Improved activity tolerance this session    Extremity/Trunk Assessment Upper Extremity Assessment Upper Extremity Assessment: Generalized weakness (essential tremor predominantly in  RLE, RUE, and jaw with strenuous effort)   Lower Extremity Assessment Lower Extremity Assessment: Defer to PT evaluation        Vision       Perception     Praxis      Cognition Arousal/Alertness: Awake/alert Behavior During Therapy: WFL for tasks assessed/performed Overall Cognitive Status: Within Functional Limits for tasks assessed                                 General Comments: Pleasant and agreeable to session. Pt with concerns about going home. Pt interested in short term rehabilitation or one more day of therapy here in hospital as her sister will not be available to stay with her until tomorrow afternoon (day of 4/12)        Exercises      Shoulder Instructions       General Comments VSS.    Pertinent Vitals/ Pain       Pain Assessment Pain Assessment: Faces Pain Score: 7  Pain Location: R hip Pain Descriptors / Indicators: Discomfort, Moaning, Operative site guarding, Tender, Throbbing, Aching Pain Intervention(s): Limited activity within patient's tolerance, Monitored during session, Repositioned  Home Living                     Bathroom Shower/Tub: Walk-in shower         Home Equipment: Agricultural consultantolling Walker (2 wheels);Adaptive equipment;Shower seat - built in Producer, television/film/video(lift recliner chair)          Prior Functioning/Environment              Frequency  Min 2X/week        Progress Toward Goals  OT Goals(current goals can now be found in the care plan section)  Progress towards OT goals: Progressing toward goals  Acute Rehab OT Goals Patient Stated Goal: one more day here for more therapy or short term rehab OT Goal Formulation: With patient Time For Goal Achievement: 05/30/22 Potential to Achieve Goals: Good ADL Goals Pt Will Perform Lower Body Dressing: with modified independence;sit to/from stand Pt Will Transfer to Toilet: with modified independence;ambulating;bedside commode Pt Will Perform Tub/Shower Transfer:  Shower transfer;with supervision;ambulating;rolling walker Additional ADL Goal #1: Pt will perform bed mobility with mod I as a precursor to ADL  Plan Discharge plan remains appropriate;Frequency remains appropriate    Co-evaluation                 AM-PAC OT "6 Clicks" Daily Activity     Outcome Measure   Help from another person eating meals?: None Help from another person taking care of personal grooming?: A Little Help from another person toileting, which includes using toliet, bedpan, or urinal?: A Little Help from another person bathing (including washing, rinsing, drying)?: A Little Help from another person to put on and taking off regular upper body clothing?: A Little Help from another person to put on and taking off regular lower body clothing?: A Little 6 Click Score: 19    End of Session Equipment Utilized During Treatment: Gait belt;Rolling walker (2 wheels)  OT Visit Diagnosis: Unsteadiness on feet (R26.81);Muscle weakness (generalized) (M62.81);Other abnormalities of gait and mobility (R26.89);Pain Pain - Right/Left: Right Pain - part of body: Hip   Activity Tolerance Patient tolerated  treatment well   Patient Left in bed;with call bell/phone within reach;with bed alarm set (PTA in room)   Nurse Communication Mobility status        Time: 3810-1751 OT Time Calculation (min): 28 min  Charges: OT General Charges $OT Visit: 1 Visit OT Treatments $Self Care/Home Management : 23-37 mins  Tyler Deis, OTR/L [facility] Acute Rehabilitation Office: [phone]   Wanda Lang 05/17/2022, 9:01 AM

## 2022-05-17 NOTE — Care Management CC44 (Signed)
Condition Code 44 Documentation Completed  Patient Details  Name: Wanda Lang MRN: [id number] Date of Birth: 07/13/1950   Condition Code 44 given:  Yes Patient signature on Condition Code 44 notice:  Yes Documentation of 2 MD's agreement:  Yes Code 44 added to claim:  Yes    Epifanio Lesches, RN 05/17/2022, 4:43 PM

## 2022-05-17 NOTE — NC FL2 (Signed)
[place] MEDICAID FL2 LEVEL OF CARE FORM     IDENTIFICATION  Patient Name: Wanda Lang Birthdate: [birth year] Sex: female Admission Date (Current Location): 05/15/2022  [facility] and [place] Number:  Producer, television/film/video and Address:  [facility], [address], [place], [place] [address]      Provider Number: [id number]  Attending Physician Name and Address:  Kathryne Hitch,*  Relative Name and Phone Number:  Lolita Cram [phone]  [phone]    Current Level of Care: Hospital Recommended Level of Care: Skilled Nursing Facility Prior Approval Number:    Date Approved/Denied:   PASRR Number: [id number]  Discharge Plan: SNF    Current Diagnoses: Patient Active Problem List   Diagnosis Date Noted   Status post total replacement of right hip 05/15/2022   Overactive bladder 04/04/2022   Unilateral primary osteoarthritis, right hip 03/20/2022   Dysuria 01/07/2022   Postmenopausal estrogen deficiency 11/13/2021   Leg length inequality 11/06/2021   Impaired ambulation 10/23/2021   Contact dermatitis 10/16/2021   SI joint arthritis 08/24/2021   Spinal stenosis, lumbar region, without neurogenic claudication 08/24/2021   Degeneration of lumbar intervertebral disc 07/24/2021   Stiffness of left knee 07/21/2021   S/P total knee arthroplasty, left 07/18/2021   Preop examination 07/07/2021   Right hip pain 07/07/2021   Low back pain 07/07/2021   Drug-induced immunodeficiency 06/12/2021   Impingement syndrome of right shoulder region 06/12/2021   Preoperative evaluation of a medical condition to rule out surgical contraindications (TAR required) 06/08/2021   COVID-19 01/24/2021   Hammer toe 04/22/2020   Metatarsalgia of left foot 04/22/2020   Osteoarthritis of midfoot 04/22/2020   Duodenal ulcer 11/12/2019   Stress 11/12/2019   Morbid obesity 11/06/2019   Pain in left knee 07/30/2019   Aortic stenosis, moderate  02/06/2018   Maxillary sinusitis, chronic 10/24/2017   Chronic diarrhea 10/09/2017   Acid reflux 07/15/2017   Asymptomatic carotid artery stenosis 07/15/2017   Cyst of skin 07/15/2017   Immunocompromised 11/09/2016   Allergic rhinitis 11/02/2016   Benign essential tremor 11/02/2016   History of hypertension 11/02/2016   Postmenopausal symptoms 07/01/2015   Seasonal allergic rhinitis due to pollen 07/01/2015   Fatty liver 09/08/2007   Rheumatoid arthritis 09/08/2007    Orientation RESPIRATION BLADDER Height & Weight     Self, Time, Situation, Place  Normal Incontinent Weight: 194 lb 10.7 oz (88.3 kg) Height:  5\' 4"  (162.6 cm)  BEHAVIORAL SYMPTOMS/MOOD NEUROLOGICAL BOWEL NUTRITION STATUS      Continent Diet (see discharge summary)  AMBULATORY STATUS COMMUNICATION OF NEEDS Skin   Supervision Verbally Surgical wounds                       Personal Care Assistance Level of Assistance  Bathing, Feeding, Dressing Bathing Assistance: Limited assistance Feeding assistance: Independent Dressing Assistance: Limited assistance     Functional Limitations Info  Sight, Hearing, Speech Sight Info: Adequate Hearing Info: Adequate Speech Info: Adequate    SPECIAL CARE FACTORS FREQUENCY  PT (By licensed PT), OT (By licensed OT)     PT Frequency: 5x week OT Frequency: 5x week            Contractures Contractures Info: Not present    Additional Factors Info  Code Status, Allergies Code Status Info: full Allergies Info: Penicillins, Amoxicillin, Misc. Sulfonamide Containing Compounds, Rosanil Cleanser (Sulfacetamide Sodium-sulfur), Sulfonamide Derivatives, Ylang-ylang (Cananga Oil (Ylang-ylang))  Current Medications (05/17/2022):  This is the current hospital active medication list Current Facility-Administered Medications  Medication Dose Route Frequency Provider Last Rate Last Admin   0.9 %  sodium chloride infusion   Intravenous Continuous Kathryne HitchBlackman,  Christopher Y, MD 75 mL/hr at 05/15/22 1312 New Bag at 05/15/22 1312   acetaminophen (TYLENOL) tablet 325-650 mg  325-650 mg Oral Q6H PRN Kathryne HitchBlackman, Christopher Y, MD       alum & mag hydroxide-simeth (MAALOX/MYLANTA) 200-200-20 MG/5ML suspension 30 mL  30 mL Oral Q4H PRN Kathryne HitchBlackman, Christopher Y, MD       aspirin chewable tablet 81 mg  81 mg Oral BID Kathryne HitchBlackman, Christopher Y, MD   81 mg at 05/17/22 1106   diphenhydrAMINE (BENADRYL) 12.5 MG/5ML elixir 12.5-25 mg  12.5-25 mg Oral Q4H PRN Kathryne HitchBlackman, Christopher Y, MD       docusate sodium (COLACE) capsule 100 mg  100 mg Oral BID Kathryne HitchBlackman, Christopher Y, MD   100 mg at 05/17/22 1106   fesoterodine (TOVIAZ) tablet 4 mg  4 mg Oral Daily Kathryne HitchBlackman, Christopher Y, MD   4 mg at 05/17/22 1106   HYDROmorphone (DILAUDID) injection 0.5-1 mg  0.5-1 mg Intravenous Q4H PRN Kathryne HitchBlackman, Christopher Y, MD   1 mg at 05/15/22 2016   menthol-cetylpyridinium (CEPACOL) lozenge 3 mg  1 lozenge Oral PRN Kathryne HitchBlackman, Christopher Y, MD       Or   phenol (CHLORASEPTIC) mouth spray 1 spray  1 spray Mouth/Throat PRN Kathryne HitchBlackman, Christopher Y, MD       methocarbamol (ROBAXIN) tablet 500 mg  500 mg Oral Q6H PRN Kathryne HitchBlackman, Christopher Y, MD   500 mg at 05/17/22 0541   Or   methocarbamol (ROBAXIN) 500 mg in dextrose 5 % 50 mL IVPB  500 mg Intravenous Q6H PRN Kathryne HitchBlackman, Christopher Y, MD       metoCLOPramide (REGLAN) tablet 5-10 mg  5-10 mg Oral Q8H PRN Kathryne HitchBlackman, Christopher Y, MD       Or   metoCLOPramide (REGLAN) injection 5-10 mg  5-10 mg Intravenous Q8H PRN Kathryne HitchBlackman, Christopher Y, MD       ondansetron [facility](ZOFRAN) tablet 4 mg  4 mg Oral Q6H PRN Kathryne HitchBlackman, Christopher Y, MD       Or   ondansetron [facility](ZOFRAN) injection 4 mg  4 mg Intravenous Q6H PRN Kathryne HitchBlackman, Christopher Y, MD       oxyCODONE (Oxy IR/ROXICODONE) immediate release tablet 10-15 mg  10-15 mg Oral Q4H PRN Kathryne HitchBlackman, Christopher Y, MD   10 mg at 05/17/22 0541   oxyCODONE (Oxy IR/ROXICODONE) immediate release tablet 5-10 mg  5-10 mg Oral  Q4H PRN Kathryne HitchBlackman, Christopher Y, MD   5 mg at 05/17/22 1238   pantoprazole (PROTONIX) EC tablet 40 mg  40 mg Oral Daily Kathryne HitchBlackman, Christopher Y, MD   40 mg at 05/17/22 1107   polyethylene glycol (MIRALAX / GLYCOLAX) packet 17 g  17 g Oral Daily PRN Kathryne HitchBlackman, Christopher Y, MD   17 g at 05/17/22 0545     Discharge Medications: Please see discharge summary for a list of discharge medications.  Relevant Imaging Results:  Relevant Lab Results:   Additional Information ss [id number]  Lorri FrederickWierda, Jaala Bohle Jon, LCSW

## 2022-05-17 NOTE — Care Management Obs Status (Signed)
MEDICARE OBSERVATION STATUS NOTIFICATION   Patient Details  Name: Wanda Lang MRN: [id number] Date of Birth: [birth year]   Medicare Observation Status Notification Given:  Yes    Epifanio Lesches, RN 05/17/2022, 4:43 PM

## 2022-05-17 NOTE — Telephone Encounter (Signed)
Misty with [facility] utilization review would like an observation order for patient.  CB# [phone].  Please advise.  Thank you.

## 2022-05-17 NOTE — Progress Notes (Signed)
Patient ID: Wanda Lang, female   DOB: [birth year], 72 y.o.   MRN: [id number] The patient's vital signs are stable.  Her right operative hip is stable.  She is making some progress with her mobility working with PT and OT.  She feels unsure of herself in terms of the pain she is having and the ability to even go home with family.  She feels that it would be more appropriate for her to have a short stay in skilled nursing for continued care after this hospitalization.  We will consult the transitional care team and see if that is feasible from insurance standpoint and bed availability standpoint.  She can be discharged as soon as tomorrow.

## 2022-05-17 NOTE — Progress Notes (Signed)
Physical Therapy Treatment Patient Details Name: Wanda Lang MRN: [id number] DOB: 25-Jul-1950 Today's Date: 05/17/2022   History of Present Illness Pt is a 72 y/o F admitted on 05/15/22 for scheduled R THA. PMH: CAD, fatty liver, GERD, heart murmur, HTN, tremor, RA, morbid obesity, low back pain, L TKA, leg length discrepancy, overactive bladder    PT Comments    Pt was received in supine and agreeable to session. Pt was able to demonstrate use of gait belt for RLE management for bed mobility with cues for technique. Pt required increased time and effort to return to supine due to difficulty elevating RLE to EOB with heavy use of gait belt for support. Pt able to stand from low EOB and perform gait trial with min guard for safety. Pt continues to be limited by RLE pain and will require assist at discharge for safety. Pt reports that sister will not be available until Friday afternoon. Pt continues to benefit from PT services to progress toward functional mobility goals.    Recommendations for follow up therapy are one component of a multi-disciplinary discharge planning process, led by the attending physician.  Recommendations may be updated based on patient status, additional functional criteria and insurance authorization.    Assistance Recommended at Discharge Intermittent Supervision/Assistance  Patient can return home with the following A little help with walking and/or transfers;A little help with bathing/dressing/bathroom;Assistance with cooking/housework;Assist for transportation;Help with stairs or ramp for entrance   Equipment Recommendations  BSC/3in1    Recommendations for Other Services       Precautions / Restrictions Precautions Precautions: Fall Restrictions Weight Bearing Restrictions: No     Mobility  Bed Mobility Overal bed mobility: Needs Assistance Bed Mobility: Supine to Sit, Sit to Supine     Supine to sit: HOB elevated, Min guard Sit to supine: Min  assist   General bed mobility comments: Increased time for all bed mobility due to pain and weakness. Light min A to assist RLE into bed. Pt able to use gait belt to lift RLE with cues for technique.    Transfers Overall transfer level: Needs assistance Equipment used: Rolling walker (2 wheels) Transfers: Sit to/from Stand Sit to Stand: Min guard           General transfer comment: Pt able to stand from low EOB with min guard for safety and increased time for set up.    Ambulation/Gait Ambulation/Gait assistance: Min guard Gait Distance (Feet): 70 Feet Assistive device: Rolling walker (2 wheels) Gait Pattern/deviations: Decreased stride length, Step-through pattern, Antalgic, Trunk flexed, Decreased stance time - right       General Gait Details: Slow, antalgic, step-through pattern with cues for upright posture. Increased reliance on BUE support on RW to limit RLE WB. min guard for safety.       Balance Overall balance assessment: Needs assistance Sitting-balance support: Feet supported, No upper extremity supported Sitting balance-Leahy Scale: Good Sitting balance - Comments: sitting EOB   Standing balance support: During functional activity, Reliant on assistive device for balance, Single extremity supported Standing balance-Leahy Scale: Poor Standing balance comment: with RW support                            Cognition Arousal/Alertness: Awake/alert Behavior During Therapy: WFL for tasks assessed/performed Overall Cognitive Status: Within Functional Limits for tasks assessed  Exercises      General Comments General comments (skin integrity, edema, etc.): VSS.      Pertinent Vitals/Pain Pain Assessment Pain Assessment: 0-10 Pain Score: 6  Pain Location: R hip Pain Descriptors / Indicators: Aching, Discomfort, Operative site guarding, Grimacing Pain Intervention(s): Limited activity  within patient's tolerance, Monitored during session, Repositioned     PT Goals (current goals can now be found in the care plan section) Acute Rehab PT Goals Patient Stated Goal: get better, go home PT Goal Formulation: With patient Time For Goal Achievement: 05/29/22 Potential to Achieve Goals: Good Progress towards PT goals: Progressing toward goals    Frequency    7X/week      PT Plan Current plan remains appropriate       AM-PAC PT "6 Clicks" Mobility   Outcome Measure  Help needed turning from your back to your side while in a flat bed without using bedrails?: A Little Help needed moving from lying on your back to sitting on the side of a flat bed without using bedrails?: A Little Help needed moving to and from a bed to a chair (including a wheelchair)?: A Little Help needed standing up from a chair using your arms (e.g., wheelchair or bedside chair)?: A Little Help needed to walk in hospital room?: A Little Help needed climbing 3-5 steps with a railing? : A Little 6 Click Score: 18    End of Session Equipment Utilized During Treatment: Gait belt Activity Tolerance: Patient tolerated treatment well;Patient limited by pain Patient left: with call bell/phone within reach;in bed Nurse Communication: Mobility status PT Visit Diagnosis: Unsteadiness on feet (R26.81);Difficulty in walking, not elsewhere classified (R26.2);Muscle weakness (generalized) (M62.81);Other abnormalities of gait and mobility (R26.89);Pain     Time: 7544-9201 PT Time Calculation (min) (ACUTE ONLY): 29 min  Charges:  $Gait Training: 8-22 mins $Therapeutic Activity: 8-22 mins                     Johny Shock, PTA Acute Rehabilitation Services Secure Chat Preferred  [phone]    Johny Shock 05/17/2022, 9:20 AM

## 2022-05-18 ENCOUNTER — Other Ambulatory Visit: Payer: Self-pay

## 2022-05-18 DIAGNOSIS — I2510 Atherosclerotic heart disease of native coronary artery without angina pectoris: Secondary | ICD-10-CM | POA: Diagnosis not present

## 2022-05-18 DIAGNOSIS — M1611 Unilateral primary osteoarthritis, right hip: Secondary | ICD-10-CM | POA: Diagnosis not present

## 2022-05-18 DIAGNOSIS — Z96652 Presence of left artificial knee joint: Secondary | ICD-10-CM | POA: Diagnosis not present

## 2022-05-18 DIAGNOSIS — M87851 Other osteonecrosis, right femur: Secondary | ICD-10-CM | POA: Diagnosis not present

## 2022-05-18 DIAGNOSIS — Z79899 Other long term (current) drug therapy: Secondary | ICD-10-CM | POA: Diagnosis not present

## 2022-05-18 DIAGNOSIS — I10 Essential (primary) hypertension: Secondary | ICD-10-CM | POA: Diagnosis not present

## 2022-05-18 DIAGNOSIS — Z8616 Personal history of COVID-19: Secondary | ICD-10-CM | POA: Diagnosis not present

## 2022-05-18 MED ORDER — GEMTESA 75 MG PO TABS: 75.0000 mg | ORAL_TABLET | Freq: Every day | ORAL | 2 refills | Status: DC

## 2022-05-18 MED ORDER — ASPIRIN 81 MG PO CHEW: 81.0000 mg | CHEWABLE_TABLET | Freq: Two times a day (BID) | ORAL | 0 refills | Status: DC

## 2022-05-18 MED ORDER — OXYCODONE HCL 5 MG PO TABS: 5.0000 mg | ORAL_TABLET | Freq: Four times a day (QID) | ORAL | 0 refills | Status: DC | PRN

## 2022-05-18 NOTE — Progress Notes (Signed)
Occupational Therapy Treatment Patient Details Name: Wanda Lang MRN: [id number] DOB: 1951-01-04 Today's Date: 05/18/2022   History of present illness Pt is a 72 y/o F admitted on 05/15/22 for scheduled R THA. PMH: CAD, fatty liver, GERD, heart murmur, HTN, tremor, RA, morbid obesity, low back pain, L TKA, leg length discrepancy, overactive bladder   OT comments  Pt making good progress toward all adl goals. Pt is at overall min guard level of assist with most adls. Pt continues to be limited by R hip pain and decreased activity tolerance. Pt has sister assisting her upon d/c but sister and pt feel better with pt getting some post acute rehab before returning home which is reasonable with level pt is at and how independent pt was prior to surgery. Will continue to address adls in standing until pt is d/c'd.   Recommendations for follow up therapy are one component of a multi-disciplinary discharge planning process, led by the attending physician.  Recommendations may be updated based on patient status, additional functional criteria and insurance authorization.    Assistance Recommended at Discharge Intermittent Supervision/Assistance  Patient can return home with the following  A little help with walking and/or transfers;A little help with bathing/dressing/bathroom;Assistance with cooking/housework;Assist for transportation;Help with stairs or ramp for entrance   Equipment Recommendations  BSC/3in1    Recommendations for Other Services      Precautions / Restrictions Precautions Precautions: Fall Restrictions Weight Bearing Restrictions: Yes RLE Weight Bearing: Weight bearing as tolerated       Mobility Bed Mobility               General bed mobility comments: Pt in recliner on arrival.    Transfers Overall transfer level: Needs assistance Equipment used: Rolling walker (2 wheels) Transfers: Sit to/from Stand Sit to Stand: Min guard           General transfer  comment: from low EOB with increased time for set up, but no assist needed.     Balance Overall balance assessment: Needs assistance Sitting-balance support: Feet supported, No upper extremity supported Sitting balance-Leahy Scale: Good     Standing balance support: During functional activity, Reliant on assistive device for balance, Single extremity supported Standing balance-Leahy Scale: Poor Standing balance comment: with RW support                           ADL either performed or assessed with clinical judgement   ADL Overall ADL's : Needs assistance/impaired     Grooming: Wash/dry hands;Wash/dry face;Oral care;Min guard;Standing Grooming Details (indicate cue type and reason): Pt stood at sink after toileting to complete grooming with min guard.             Lower Body Dressing: Min guard;With adaptive equipment;Cueing for compensatory techniques;Sit to/from stand Lower Body Dressing Details (indicate cue type and reason): Pt has hip kip at home and uses reacher, dressing stick, sock aid and long shoe horn. Assist only needed when standing secondary to decreased balance and pain. Toilet Transfer: Min guard;Ambulation;Rolling walker (2 wheels);Comfort height toilet;Grab bars Toilet Transfer Details (indicate cue type and reason): Pt walked to bathroom to complete toileting. Min guard for transfer. Toileting- Clothing Manipulation and Hygiene: Set up;Sitting/lateral lean       Functional mobility during ADLs: Min guard;Rolling walker (2 wheels) General ADL Comments: Improved activity tolerance this session and now toileting in the bathroom.    Extremity/Trunk Assessment Upper Extremity Assessment Upper Extremity Assessment: Overall  WFL for tasks assessed   Lower Extremity Assessment Lower Extremity Assessment: Defer to PT evaluation        Vision   Vision Assessment?: No apparent visual deficits   Perception     Praxis      Cognition  Arousal/Alertness: Awake/alert Behavior During Therapy: WFL for tasks assessed/performed Overall Cognitive Status: Within Functional Limits for tasks assessed                                 General Comments: Pt states she is going to rehab today and then sister will stay with her after rehab        Exercises      Shoulder Instructions       General Comments Pt progressing will. Most limited by pain in hip with transitional movements.    Pertinent Vitals/ Pain       Pain Assessment Pain Assessment: Faces Faces Pain Scale: Hurts little more Pain Location: R hip Pain Descriptors / Indicators: Aching, Discomfort, Operative site guarding, Grimacing Pain Intervention(s): Limited activity within patient's tolerance, Monitored during session, Repositioned  Home Living                                          Prior Functioning/Environment              Frequency  Min 2X/week        Progress Toward Goals  OT Goals(current goals can now be found in the care plan section)  Progress towards OT goals: Progressing toward goals  Acute Rehab OT Goals Patient Stated Goal: to get to rehab OT Goal Formulation: With patient Time For Goal Achievement: 05/30/22 Potential to Achieve Goals: Good ADL Goals Pt Will Perform Lower Body Dressing: with modified independence;sit to/from stand Pt Will Transfer to Toilet: with modified independence;ambulating;bedside commode Pt Will Perform Tub/Shower Transfer: Shower transfer;with supervision;ambulating;rolling walker Additional ADL Goal #1: Pt will perform bed mobility with mod I as a precursor to ADL  Plan Frequency remains appropriate;Discharge plan needs to be updated    Co-evaluation                 AM-PAC OT "6 Clicks" Daily Activity     Outcome Measure   Help from another person eating meals?: None Help from another person taking care of personal grooming?: None Help from another person  toileting, which includes using toliet, bedpan, or urinal?: A Little Help from another person bathing (including washing, rinsing, drying)?: A Little Help from another person to put on and taking off regular upper body clothing?: None Help from another person to put on and taking off regular lower body clothing?: A Little 6 Click Score: 21    End of Session Equipment Utilized During Treatment: Gait belt;Rolling walker (2 wheels)  OT Visit Diagnosis: Unsteadiness on feet (R26.81);Muscle weakness (generalized) (M62.81);Other abnormalities of gait and mobility (R26.89);Pain Pain - Right/Left: Right Pain - part of body: Hip   Activity Tolerance Patient tolerated treatment well   Patient Left in chair;with call bell/phone within reach   Nurse Communication Mobility status        Time: 0045-9977 OT Time Calculation (min): 18 min  Charges: OT General Charges $OT Visit: 1 Visit OT Treatments $Self Care/Home Management : 8-22 mins   Hope Budds 05/18/2022, 12:50 PM

## 2022-05-18 NOTE — Plan of Care (Signed)
  Problem: Education: Goal: Knowledge of General Education information will improve Description: Including pain rating scale, medication(s)/side effects and non-pharmacologic comfort measures Outcome: Adequate for Discharge   Problem: Health Behavior/Discharge Planning: Goal: Ability to manage health-related needs will improve Outcome: Adequate for Discharge   Problem: Clinical Measurements: Goal: Ability to maintain clinical measurements within normal limits will improve Outcome: Adequate for Discharge Goal: Will remain free from infection Outcome: Adequate for Discharge Goal: Diagnostic test results will improve Outcome: Adequate for Discharge Goal: Respiratory complications will improve Outcome: Adequate for Discharge   Problem: Activity: Goal: Risk for activity intolerance will decrease Outcome: Adequate for Discharge   Problem: Nutrition: Goal: Adequate nutrition will be maintained Outcome: Adequate for Discharge   Problem: Coping: Goal: Level of anxiety will decrease Outcome: Adequate for Discharge   Problem: Elimination: Goal: Will not experience complications related to bowel motility Outcome: Adequate for Discharge Goal: Will not experience complications related to urinary retention Outcome: Adequate for Discharge   Problem: Pain Managment: Goal: General experience of comfort will improve Outcome: Adequate for Discharge   Problem: Safety: Goal: Ability to remain free from injury will improve Outcome: Adequate for Discharge   Problem: Skin Integrity: Goal: Risk for impaired skin integrity will decrease Outcome: Adequate for Discharge   Problem: Education: Goal: Knowledge of the prescribed therapeutic regimen will improve Outcome: Adequate for Discharge Goal: Understanding of discharge needs will improve Outcome: Adequate for Discharge Goal: Individualized Educational Video(s) Outcome: Adequate for Discharge   Problem: Activity: Goal: Ability to avoid  complications of mobility impairment will improve Outcome: Adequate for Discharge Goal: Ability to tolerate increased activity will improve Outcome: Adequate for Discharge   Problem: Clinical Measurements: Goal: Postoperative complications will be avoided or minimized Outcome: Adequate for Discharge   Problem: Pain Management: Goal: Pain level will decrease with appropriate interventions Outcome: Adequate for Discharge   Problem: Skin Integrity: Goal: Will show signs of wound healing Outcome: Adequate for Discharge

## 2022-05-18 NOTE — Discharge Summary (Signed)
Patient ID: Wanda Lang MRN: [id number] DOB/AGE: [birth year] 72 y.o.  Admit date: 05/15/2022 Discharge date: 05/18/2022  Admission Diagnoses:  Principal Problem:   Unilateral primary osteoarthritis, right hip Active Problems:   Status post total replacement of right hip   S/P total hip arthroplasty   Discharge Diagnoses:  Same  Past Medical History:  Diagnosis Date   Allergy    Arthritis    Coronary artery disease    Fatty liver    GERD (gastroesophageal reflux disease)    Heart murmur    Hypertension    Hx   LIVER FUNCTION TESTS, ABNORMAL, HX OF 09/08/2007   Qualifier: Diagnosis of  By: Nelson-Smith CMA (AAMA), Dottie     Neuromuscular disorder    Essential Tremor   Overactive bladder     Surgeries: Procedure(s): RIGHT TOTAL HIP ARTHROPLASTY ANTERIOR APPROACH on 05/15/2022   Consultants:   Discharged Condition: Improved  Hospital Course: DEBONY SWEE is an 72 y.o. female who was admitted 05/15/2022 for operative treatment ofUnilateral primary osteoarthritis, right hip. Patient has severe unremitting pain that affects sleep, daily activities, and work/hobbies. After pre-op clearance the patient was taken to the operating room on 05/15/2022 and underwent  Procedure(s): RIGHT TOTAL HIP ARTHROPLASTY ANTERIOR APPROACH.    Patient was given perioperative antibiotics:  Anti-infectives (From admission, onward)    Start     Dose/Rate Route Frequency Ordered Stop   05/15/22 1145  ceFAZolin (ANCEF) IVPB 1 g/50 mL premix        1 g 100 mL/hr over 30 Minutes Intravenous Every 6 hours 05/15/22 1139 05/15/22 2344   05/15/22 0815  ceFAZolin (ANCEF) IVPB 2g/100 mL premix        2 g 200 mL/hr over 30 Minutes Intravenous  Once 05/15/22 0805 05/15/22 1004   05/15/22 0806  ceFAZolin (ANCEF) 2-4 GM/100ML-% IVPB       Note to Pharmacy: Lacie Draft A: cabinet override      05/15/22 0806 05/15/22 0934   05/15/22 0800  vancomycin (VANCOCIN) IVPB 1000 mg/200 mL premix  Status:   Discontinued        1,000 mg 200 mL/hr over 60 Minutes Intravenous On call to O.R. 05/15/22 2774 05/15/22 0805        Patient was given sequential compression devices, early ambulation, and chemoprophylaxis to prevent DVT.  Patient benefited maximally from hospital stay and there were no complications.    Recent vital signs: Patient Vitals for the past 24 hrs:  BP Temp Temp src Pulse Resp SpO2  05/18/22 0328 (!) 113/58 98.6 F (37 C) Oral 97 16 97 %  05/17/22 2326 111/60 98.7 F (37.1 C) -- 93 18 95 %  05/17/22 2121 -- 98.9 F (37.2 C) Oral -- -- --  05/17/22 2022 -- 100.1 F (37.8 C) Oral -- -- --  05/17/22 1959 104/62 (!) 100.6 F (38.1 C) -- (!) 106 18 97 %  05/17/22 1505 120/62 97.7 F (36.5 C) Oral 96 17 98 %  05/17/22 1342 115/63 97.9 F (36.6 C) Oral (!) 113 16 97 %  05/17/22 0809 (!) 104/57 99.4 F (37.4 C) Oral (!) 108 18 93 %     Recent laboratory studies:  Recent Labs    05/16/22 0146 05/17/22 0126  WBC 6.1 8.9  HGB 9.7* 9.4*  HCT 28.5* 27.8*  PLT 118* 105*  NA 139  --   K 4.1  --   CL 104  --   CO2 28  --   BUN 12  --  CREATININE 0.80  --   GLUCOSE 120*  --   CALCIUM 8.3*  --      Discharge Medications:   Allergies as of 05/18/2022       Reactions   Penicillins Itching   Tolerated Cephalosporin 07/18/21. Other reaction(s): wheezing   Amoxicillin Itching   Misc. Sulfonamide Containing Compounds    Rosanil Cleanser [sulfacetamide Sodium-sulfur] Other (See Comments)   Unknown reaction    Sulfonamide Derivatives    Unknown reaction    Ylang-ylang [cananga Oil (ylang-ylang)] Itching        Medication List     STOP taking these medications    celecoxib 200 MG capsule Commonly known as: CELEBREX       TAKE these medications    acetaminophen 500 MG tablet Commonly known as: TYLENOL Take 1,000 mg by mouth every 6 (six) hours as needed for moderate pain.   aspirin 81 MG chewable tablet Chew 1 tablet (81 mg total) by mouth 2 (two)  times daily.   b complex vitamins capsule Take 1 capsule by mouth daily.   Blue-Emu Hemp 10 % cream Generic drug: trolamine salicylate Apply 1 Application topically as needed for muscle pain.   CALCIUM PO Take 1,200 mg by mouth daily.   cycloSPORINE 0.05 % ophthalmic emulsion Commonly known as: RESTASIS Place 1 drop into both eyes 2 (two) times daily.   DSS 100 MG Caps Take 1,000 mg by mouth in the morning, at noon, in the evening, and at bedtime.   estradiol 0.05 MG/24HR patch Commonly known as: VIVELLE-DOT Place 1 patch onto the skin 2 (two) times a week.   Flaxseed Oil 1200 MG Caps Take 1,200 mg by mouth daily.   Gemtesa 75 MG Tabs Generic drug: Vibegron Take 1 tablet (75 mg total) by mouth daily.   Glucosamine HCl 1000 MG Tabs Take 1,000 mg by mouth daily.   hydroxychloroquine 200 MG tablet Commonly known as: PLAQUENIL Take 200 mg by mouth 2 (two) times daily.   lidocaine 5 % Commonly known as: Lidoderm Place 1 patch onto the skin every 12 (twelve) hours. Remove & Discard patch within 12 hours or as directed by MD   Magnesium 100 MG Tabs Take 100 mg by mouth 2 (two) times a week.   methocarbamol 500 MG tablet Commonly known as: ROBAXIN Take 500 mg by mouth every 6 (six) hours.   mometasone 0.1 % cream Commonly known as: ELOCON Apply topically to affected areas behind ears and neck twice daily for itchy rash use for up to 2 weeks. Can also use at itchy areas at abdomen. What changed:  how much to take how to take this when to take this reasons to take this additional instructions   multivitamin with minerals Tabs tablet Take 1 tablet by mouth daily.   oxyCODONE 5 MG immediate release tablet Commonly known as: Oxy IR/ROXICODONE Take 1-2 tablets (5-10 mg total) by mouth every 6 (six) hours as needed for moderate pain (pain score 4-6). What changed:  how much to take reasons to take this   polyethylene glycol 17 g packet Commonly known as: MIRALAX  / GLYCOLAX Take 17 g by mouth daily as needed for mild constipation. What changed: when to take this   POTASSIUM PO Take 3 tablets by mouth daily.   Vitamin D (Ergocalciferol) 1.25 MG (50000 UNIT) Caps capsule Commonly known as: DRISDOL Take 50,000 Units by mouth every 7 (seven) days.   Zinc 100 MG Tabs Take 100 mg by mouth 2 (  two) times a week.               Durable Medical Equipment  (From admission, onward)           Start     Ordered   05/15/22 1251  DME 3 n 1  Once        05/15/22 1250   05/15/22 1251  DME Walker rolling  Once       Question Answer Comment  Walker: With 5 Inch Wheels   Patient needs a walker to treat with the following condition Status post total replacement of right hip      05/15/22 1250            Diagnostic Studies: DG Pelvis Portable  Result Date: 05/15/2022 CLINICAL DATA:  Postop right hip replacement. EXAM: PORTABLE PELVIS 1-2 VIEWS COMPARISON:  Operative images, obtained earlier today. Prior exam, 02/22/2022. FINDINGS: Right total hip arthroplasty appears well seated and well aligned on the single AP image. No acute fracture or evidence of an operative complication. IMPRESSION: Well-positioned total right hip arthroplasty. Electronically Signed   By: Amie Portland M.D.   On: 05/15/2022 12:29   DG HIP UNILAT WITH PELVIS 1V RIGHT  Result Date: 05/15/2022 CLINICAL DATA:  Total hip arthroplasty anterior approach. Intraoperative fluoroscopy. EXAM: DG HIP (WITH OR WITHOUT PELVIS) 1V RIGHT COMPARISON:  Pelvis and right hip radiographs 02/22/2022 FINDINGS: Images were performed intraoperatively without the presence of a radiologist. Interval total right hip arthroplasty. No hardware complication is seen. Total fluoroscopy images: 3 Total fluoroscopy time: 23 seconds Total dose: Radiation Exposure Index (as provided by the fluoroscopic device): 2.538 mGy air Kerma Please see intraoperative findings for further detail. IMPRESSION: Intraoperative  fluoroscopy for total right hip arthroplasty. Electronically Signed   By: Neita Garnet M.D.   On: 05/15/2022 11:56   DG C-Arm 1-60 Min-No Report  Result Date: 05/15/2022 Fluoroscopy was utilized by the requesting physician.  No radiographic interpretation.   DG C-Arm 1-60 Min-No Report  Result Date: 05/15/2022 Fluoroscopy was utilized by the requesting physician.  No radiographic interpretation.    Disposition: Discharge disposition: 03-Skilled Nursing Facility          Follow-up Information     Kathryne Hitch, MD. Go on 05/28/2022.   Specialty: Orthopedic Surgery Why: at 10:45 am for your first post op appointment Contact information: [address] [place] [address] [phone]         Glori Luis, MD Follow up.   Specialty: Family Medicine Contact information: [address] [place] [address] [phone]         Care, [facility] Follow up.   Specialty: Home Health Services Why: home health services will be provided by [facility], start of care within 48 hours post discharge Contact information: [address] [place] [address] [phone]                  Signed: Kathryne Hitch 05/18/2022, 6:40 AM

## 2022-05-18 NOTE — TOC Progression Note (Addendum)
Transition of Care [facility]) - Progression Note    Patient Details  Name: Wanda Lang MRN: [id number] Date of Birth: 01/26/1951  Transition of Care [facility]) CM/SW Contact  Lorri Frederick, LCSW Phone Number: 05/18/2022, 10:01 AM  Clinical Narrative:   Bed offers provided to pt on medicare choice document.  She is asking for response from [facility].  [facility] does offer a bed, may be out of network, pt informed, does want [facility], will call her insurance to inquire about out of network costs.  CSW message from [employer]-they are in network with [employer].  Auth request submitted in [place] and approved: [id number], 5 days: 4/12-4/16.  1030: CSW spoke with pt, she did talk to [facility] and is satisfied with the payment arrangement.  Discussed need for transportation and she said her friend/pastor is on her way and will provide transportation.  Confirmed with Andrea/[facility], who can receive today, asking that pt not leave the hospital until 1pm.      Expected Discharge Plan: Home w Home Health Services Barriers to Discharge: Continued Medical Work up  Expected Discharge Plan and Services In-house Referral: Clinical Social Work   Post Acute Care Choice: Home Health Living arrangements for the past 2 months: Single Family Home Expected Discharge Date: 05/18/22               DME Arranged: 3-N-1 (Has rolling walker at home) DME Agency: [employer] Date DME Agency Contacted: 05/16/22 Time DME Agency Contacted: 1339 Representative spoke with at DME Agency: Vaughan Basta HH Arranged: PT HH Agency: [facility] Date [facility] Agency Contacted: 05/16/22 Time HH Agency Contacted: 1338 Representative spoke with at [facility] Agency: Kandee Keen   Social Determinants of Health (SDOH) Interventions SDOH Screenings   Food Insecurity: No Food Insecurity (12/05/2021)  Housing: Low Risk  (12/05/2021)  Transportation Needs: No Transportation Needs (12/05/2021)  Depression (PHQ2-9): Low Risk   (04/04/2022)  Financial Resource Strain: Low Risk  (07/13/2021)  Social Connections: Unknown (07/13/2021)  Stress: No Stress Concern Present (07/13/2021)  Tobacco Use: Low Risk  (05/15/2022)    Readmission Risk Interventions     No data to display

## 2022-05-18 NOTE — TOC Transition Note (Signed)
Transition of Care [facility]) - CM/SW Discharge Note   Patient Details  Name: Wanda Lang MRN: [id number] Date of Birth: 1950-11-22  Transition of Care [facility]) CM/SW Contact:  Lorri Frederick, LCSW Phone Number: 05/18/2022, 12:03 PM   Clinical Narrative:   Pt discharging to [facility], room 101.  RN call report to [phone].  Pt reports her pastor will transport her to SNF, will need to ask her what help is needed to get pt to her vehicle.    SNF asking that pt leave the hospital at 1pm.    Final next level of care: Skilled Nursing Facility Barriers to Discharge: Barriers Resolved   Patient Goals and CMS Choice   Choice offered to / list presented to : Patient (No HH agency preference)  Discharge Placement                Patient chooses bed at:  [facility]) Patient to be transferred to facility by: friend/Pastor Name of family member notified: Left message with brother Theodoro Grist Patient and family notified of of transfer: 05/18/22  Discharge Plan and Services Additional resources added to the After Visit Summary for   In-house Referral: Clinical Social Work   Post Acute Care Choice: Home Health          DME Arranged: 3-N-1 (Has rolling walker at home) DME Agency: [employer] Date DME Agency Contacted: 05/16/22 Time DME Agency Contacted: 1339 Representative spoke with at DME Agency: Vaughan Basta HH Arranged: PT HH Agency: [facility] Date [facility] Agency Contacted: 05/16/22 Time HH Agency Contacted: 1338 Representative spoke with at [facility] Agency: Kandee Keen  Social Determinants of Health (SDOH) Interventions SDOH Screenings   Food Insecurity: No Food Insecurity (12/05/2021)  Housing: Low Risk  (12/05/2021)  Transportation Needs: No Transportation Needs (12/05/2021)  Depression (PHQ2-9): Low Risk  (04/04/2022)  Financial Resource Strain: Low Risk  (07/13/2021)  Social Connections: Unknown (07/13/2021)  Stress: No Stress Concern Present (07/13/2021)  Tobacco  Use: Low Risk  (05/15/2022)     Readmission Risk Interventions     No data to display

## 2022-05-18 NOTE — Progress Notes (Signed)
Subjective: 3 Days Post-Op Procedure(s) (LRB): RIGHT TOTAL HIP ARTHROPLASTY ANTERIOR APPROACH (Right) Patient reports pain as moderate.    Objective: Vital signs in last 24 hours: Temp:  [97.7 F (36.5 C)-100.6 F (38.1 C)] 98.6 F (37 C) (04/12 0328) Pulse Rate:  [93-113] 97 (04/12 0328) Resp:  [16-18] 16 (04/12 0328) BP: (104-120)/(57-63) 113/58 (04/12 0328) SpO2:  [93 %-98 %] 97 % (04/12 0328)  Intake/Output from previous day: 04/11 0701 - 04/12 0700 In: 600 [P.O.:600] Out: -  Intake/Output this shift: Total I/O In: 240 [P.O.:240] Out: -   Recent Labs    05/16/22 0146 05/17/22 0126  HGB 9.7* 9.4*   Recent Labs    05/16/22 0146 05/17/22 0126  WBC 6.1 8.9  RBC 3.17* 3.11*  HCT 28.5* 27.8*  PLT 118* 105*   Recent Labs    05/16/22 0146  NA 139  K 4.1  CL 104  CO2 28  BUN 12  CREATININE 0.80  GLUCOSE 120*  CALCIUM 8.3*   No results for input(s): "LABPT", "INR" in the last 72 hours.  Sensation intact distally Intact pulses distally Dorsiflexion/Plantar flexion intact Incision: dressing C/D/I   Assessment/Plan: 3 Days Post-Op Procedure(s) (LRB): RIGHT TOTAL HIP ARTHROPLASTY ANTERIOR APPROACH (Right) Up with therapy Discharge to SNF today if bed available      Kathryne Hitch 05/18/2022, 6:38 AM

## 2022-05-18 NOTE — Progress Notes (Signed)
Physical Therapy Treatment Patient Details Name: Wanda Lang MRN: [id number] DOB: 06-22-1950 Today's Date: 05/18/2022   History of Present Illness Pt is a 72 y/o F admitted on 05/15/22 for scheduled R THA. PMH: CAD, fatty liver, GERD, heart murmur, HTN, tremor, RA, morbid obesity, low back pain, L TKA, leg length discrepancy, overactive bladder    PT Comments    Pt was received sitting EOB and agreeable to session. Pt was able to tolerate gait distance in hallway with trial of shoe lift due to pt reporting use of lift before surgery. Pt reporting increased R hip pain with lift vs without, so the lift was taken off for the second half of gait trial. Pt reporting sharp R hip pain upon initially standing that improved with ambulation. Pt's activity tolerance continues to be limited by R hip pain, however pt able to perform mobility tasks with min guard for safety. Pt continues to benefit from PT services to progress toward functional mobility goals.     Recommendations for follow up therapy are one component of a multi-disciplinary discharge planning process, led by the attending physician.  Recommendations may be updated based on patient status, additional functional criteria and insurance authorization.  Follow Up Recommendations       Assistance Recommended at Discharge Intermittent Supervision/Assistance  Patient can return home with the following A little help with walking and/or transfers;A little help with bathing/dressing/bathroom;Assistance with cooking/housework;Assist for transportation;Help with stairs or ramp for entrance   Equipment Recommendations  BSC/3in1    Recommendations for Other Services       Precautions / Restrictions Precautions Precautions: Fall Restrictions Weight Bearing Restrictions: Yes RLE Weight Bearing: Weight bearing as tolerated     Mobility  Bed Mobility               General bed mobility comments: Pt sitting EOB upon arrival and ending  session in recliner    Transfers Overall transfer level: Needs assistance Equipment used: Rolling walker (2 wheels) Transfers: Sit to/from Stand Sit to Stand: Min guard           General transfer comment: from low EOB with increased time for set up, but no assist needed.    Ambulation/Gait Ambulation/Gait assistance: Min guard Gait Distance (Feet): 80 Feet Assistive device: Rolling walker (2 wheels) Gait Pattern/deviations: Decreased stride length, Step-through pattern, Antalgic, Trunk flexed, Decreased stance time - right       General Gait Details: Improved cadence and R stance time. Pt wearing shoe lift for first half of distance and without for the second half to assess difference in pain and gait kinematics. Pt reporting use of shoe lift before surgery and increase in pain with shoe lift vs without.       Balance Overall balance assessment: Needs assistance Sitting-balance support: Feet supported, No upper extremity supported Sitting balance-Leahy Scale: Good Sitting balance - Comments: sitting EOB   Standing balance support: During functional activity, Reliant on assistive device for balance, Single extremity supported Standing balance-Leahy Scale: Poor Standing balance comment: with RW support                            Cognition Arousal/Alertness: Awake/alert Behavior During Therapy: WFL for tasks assessed/performed Overall Cognitive Status: Within Functional Limits for tasks assessed  Exercises Total Joint Exercises Marching in Standing: AROM, Both, 10 reps, Standing    General Comments        Pertinent Vitals/Pain Pain Assessment Pain Assessment: Faces Faces Pain Scale: Hurts even more Pain Location: R hip Pain Descriptors / Indicators: Aching, Discomfort, Operative site guarding, Grimacing Pain Intervention(s): Limited activity within patient's tolerance, Monitored during  session     PT Goals (current goals can now be found in the care plan section) Acute Rehab PT Goals Patient Stated Goal: get better, go home PT Goal Formulation: With patient Time For Goal Achievement: 05/29/22 Potential to Achieve Goals: Good Progress towards PT goals: Progressing toward goals    Frequency    7X/week      PT Plan Current plan remains appropriate       AM-PAC PT "6 Clicks" Mobility   Outcome Measure  Help needed turning from your back to your side while in a flat bed without using bedrails?: None Help needed moving from lying on your back to sitting on the side of a flat bed without using bedrails?: None Help needed moving to and from a bed to a chair (including a wheelchair)?: A Little Help needed standing up from a chair using your arms (e.g., wheelchair or bedside chair)?: A Little Help needed to walk in hospital room?: A Little Help needed climbing 3-5 steps with a railing? : A Little 6 Click Score: 20    End of Session Equipment Utilized During Treatment: Gait belt Activity Tolerance: Patient limited by pain Patient left: with call bell/phone within reach;in chair Nurse Communication: Mobility status PT Visit Diagnosis: Unsteadiness on feet (R26.81);Difficulty in walking, not elsewhere classified (R26.2);Muscle weakness (generalized) (M62.81);Other abnormalities of gait and mobility (R26.89);Pain     Time: 1100-1120 PT Time Calculation (min) (ACUTE ONLY): 20 min  Charges:  $Gait Training: 8-22 mins                     Johny Shock, PTA Acute Rehabilitation Services Secure Chat Preferred  [phone]    Johny Shock 05/18/2022, 11:31 AM

## 2022-05-18 NOTE — Progress Notes (Signed)
Patient and family given AVS and prescriptions.  Family to transport her to [facility].    NO script given for Gemtesa.  I have tried multiple times to get in contact with Dr. Amie Critchley and unable to do so.  I contacted Dr. Ophelia Charter at the office and he stated that they are not responsible for giving prescriptions for "non-orthopedic" medications and that the patient needs to reach out to her PCP.  I explained that it was on the AVS as a new start medication signed by Dr. Magnus Ivan.  Unable to help the patient get a script and the patient decided to leave without her script.

## 2022-05-23 ENCOUNTER — Encounter: Payer: Self-pay | Admitting: Student

## 2022-05-23 ENCOUNTER — Telehealth: Payer: Self-pay | Admitting: *Deleted

## 2022-05-23 ENCOUNTER — Non-Acute Institutional Stay (SKILLED_NURSING_FACILITY): Payer: Medicare PPO | Admitting: Student

## 2022-05-23 DIAGNOSIS — I350 Nonrheumatic aortic (valve) stenosis: Secondary | ICD-10-CM

## 2022-05-23 DIAGNOSIS — Z780 Asymptomatic menopausal state: Secondary | ICD-10-CM | POA: Diagnosis not present

## 2022-05-23 DIAGNOSIS — M069 Rheumatoid arthritis, unspecified: Secondary | ICD-10-CM

## 2022-05-23 DIAGNOSIS — Z96641 Presence of right artificial hip joint: Secondary | ICD-10-CM

## 2022-05-23 NOTE — Telephone Encounter (Signed)
Attempted D/C call to patient on 05/21/22 with no answer and left VM requesting call back. Also left VM on 05/22/22 for 1 week post op call. No answer and left VM.

## 2022-05-23 NOTE — Progress Notes (Signed)
Provider:  Dr. Earnestine Mealing Location:  Other [place].  Nursing Home Room Number: [facility] 101A Place of Service:  SNF (31)  PCP: Glori Luis, MD Patient Care Team: Glori Luis, MD as PCP - General (Family Medicine)  Extended Emergency Contact Information Primary Emergency Contact: Asher-Lawson,Marybeth Home Phone: [phone] Mobile Phone: [phone] Relation: Friend Secondary Emergency Contact: [facility] Phone: [phone] Mobile Phone: [phone] Relation: Brother  Code Status: Full Code Goals of Care: Advanced Directive information    05/23/2022    8:49 AM  Advanced Directives  Does Patient Have a Medical Advance Directive? No  Does patient want to make changes to medical advance directive? No - Patient declined      Chief Complaint  Patient presents with   New Admit To SNF    Admission.    HPI: Patient is a 72 y.o. female seen today for admission to [facility]   She had knee replacement alst August. Had back pain. She had hip pain and an MRI. She had been going to a physiatrist in [place] and was going to get an injection in the spine. October to January she just had pain -- she was getting oxycodone in that time frame.   She had no ball in the socket of her hip. Her leg was 1.5 inches shorter. She kept feeling that it was grinding and that she was limping. S  She is coming along now. The pain has improved. After 1 week it's improved.   She was living at home alone. She was driving until her surgery.    Past Medical History:  Diagnosis Date   Allergy    Arthritis    Coronary artery disease    Fatty liver    GERD (gastroesophageal reflux disease)    Heart murmur    Hypertension    Hx   LIVER FUNCTION TESTS, ABNORMAL, HX OF 09/08/2007   Qualifier: Diagnosis of  By: Nelson-Smith CMA (AAMA), Dottie     Neuromuscular disorder    Essential Tremor   Overactive bladder    Past Surgical History:  Procedure Laterality Date    ABDOMINAL HYSTERECTOMY     COLONOSCOPY WITH PROPOFOL N/A 09/04/2018   Procedure: COLONOSCOPY WITH PROPOFOL;  Surgeon: Pasty Spillers, MD;  Location: [facility] ENDOSCOPY;  Service: Endoscopy;  Laterality: N/A;   ESOPHAGOGASTRODUODENOSCOPY (EGD) WITH PROPOFOL N/A 09/04/2018   Procedure: ESOPHAGOGASTRODUODENOSCOPY (EGD) WITH PROPOFOL;  Surgeon: Pasty Spillers, MD;  Location: [facility] ENDOSCOPY;  Service: Endoscopy;  Laterality: N/A;   LAPAROSCOPY     for endometriosis x2   TONSILLECTOMY     TOTAL HIP ARTHROPLASTY Right 05/15/2022   Procedure: RIGHT TOTAL HIP ARTHROPLASTY ANTERIOR APPROACH;  Surgeon: Kathryne Hitch, MD;  Location: [facility] OR;  Service: Orthopedics;  Laterality: Right;   TOTAL KNEE ARTHROPLASTY Left 07/18/2021   Procedure: TOTAL KNEE ARTHROPLASTY;  Surgeon: Durene Romans, MD;  Location: [facility] ORS;  Service: Orthopedics;  Laterality: Left;   TURBINATE REDUCTION      reports that she has never smoked. She has never used smokeless tobacco. She reports that she does not currently use alcohol after a past usage of about 1.0 standard drink of alcohol per week. She reports that she does not use drugs. Social History   Socioeconomic History   Marital status: Widowed    Spouse name: Not on file   Number of children: Not on file   Years of education: Not on file   Highest education level: Not on file  Occupational  History   Not on file  Tobacco Use   Smoking status: Never   Smokeless tobacco: Never  Vaping Use   Vaping Use: Never used  Substance and Sexual Activity   Alcohol use: Not Currently    Alcohol/week: 1.0 standard drink of alcohol    Types: 1 Glasses of wine per week   Drug use: No   Sexual activity: Not Currently  Other Topics Concern   Not on file  Social History Narrative   Not on file   Social Determinants of Health   Financial Resource Strain: Low Risk  (07/13/2021)   Overall Financial Resource Strain (CARDIA)    Difficulty of Paying Living Expenses:  Not hard at all  Food Insecurity: No Food Insecurity (12/05/2021)   Hunger Vital Sign    Worried About Running Out of Food in the Last Year: Never true    Ran Out of Food in the Last Year: Never true  Transportation Needs: No Transportation Needs (12/05/2021)   PRAPARE - Administrator, Civil Service (Medical): No    Lack of Transportation (Non-Medical): No  Physical Activity: Not on file  Stress: No Stress Concern Present (07/13/2021)   [employer] of Occupational Health - Occupational Stress Questionnaire    Feeling of Stress : Not at all  Social Connections: Unknown (07/13/2021)   Social Connection and Isolation Panel [NHANES]    Frequency of Communication with Friends and Family: More than three times a week    Frequency of Social Gatherings with Friends and Family: More than three times a week    Attends Religious Services: More than 4 times per year    Active Member of [employer] or Organizations: Not on file    Attends Banker Meetings: Not on file    Marital Status: Widowed  Intimate Partner Violence: Not At Risk (07/13/2021)   Humiliation, Afraid, Rape, and Kick questionnaire    Fear of Current or Ex-Partner: No    Emotionally Abused: No    Physically Abused: No    Sexually Abused: No    Functional Status Survey:    Family History  Problem Relation Age of Onset   Alcohol abuse Mother    Arthritis Mother    Hyperlipidemia Mother    Heart disease Mother    Stroke Mother    Hypertension Mother    Depression Mother    Anxiety disorder Mother    Arthritis Father    Lung cancer Paternal Grandfather    Kidney disease Paternal Grandfather     Health Maintenance  Topic Date Due   DTaP/Tdap/Td (1 - Tdap) Never done   MAMMOGRAM  06/07/2018   COVID-19 Vaccine (5 - 2023-24 season) 10/06/2021   Medicare Annual Wellness (AWV)  07/14/2022   INFLUENZA VACCINE  09/06/2022   COLONOSCOPY (Pts 45-83yrs Insurance coverage will need to be confirmed)   09/03/2028   Pneumonia Vaccine 46+ Years old  Completed   DEXA SCAN  Completed   Hepatitis C Screening  Completed   Zoster Vaccines- Shingrix  Completed   HPV VACCINES  Aged Out    Allergies  Allergen Reactions   Penicillins Itching    Tolerated Cephalosporin 07/18/21.   Other reaction(s): wheezing   Amoxicillin Itching   Misc. Sulfonamide Containing Compounds    Rosanil Cleanser [Sulfacetamide Sodium-Sulfur] Other (See Comments)    Unknown reaction    Sulfonamide Derivatives     Unknown reaction    Ylang-Ylang [Cananga Oil (Ylang-Ylang)] Itching    Outpatient Encounter  Medications as of 05/23/2022  Medication Sig   acetaminophen (TYLENOL) 500 MG tablet Take 1,000 mg by mouth every 6 (six) hours as needed for moderate pain.   aspirin 81 MG chewable tablet Chew 1 tablet (81 mg total) by mouth 2 (two) times daily.   b complex vitamins capsule Take 1 capsule by mouth daily.   CALCIUM PO Take 1,200 mg by mouth daily.   cycloSPORINE (RESTASIS) 0.05 % ophthalmic emulsion Place 1 drop into both eyes 2 (two) times daily.   docusate sodium (COLACE) 100 MG capsule Take 100 mg by mouth 2 (two) times daily.   estradiol (VIVELLE-DOT) 0.05 MG/24HR patch Place 1 patch onto the skin 2 (two) times a week.   Flaxseed, Linseed, (FLAXSEED OIL) 1200 MG CAPS Take 1,200 mg by mouth daily.   Glucosamine HCl 1000 MG TABS Take 1,000 mg by mouth daily.   hydroxychloroquine (PLAQUENIL) 200 MG tablet Take 200 mg by mouth 2 (two) times daily.   Infant Care Products [facility] EX) Apply to buttocks topically every shift.   lidocaine (LIDODERM) 5 % Place 1 patch onto the skin every 12 (twelve) hours. Remove & Discard patch within 12 hours or as directed by MD   Magnesium 100 MG TABS Take 100 mg by mouth 2 (two) times a week.   methocarbamol (ROBAXIN) 500 MG tablet Take 500 mg by mouth every 6 (six) hours.   Multiple Vitamin (MULTIVITAMIN WITH MINERALS) TABS tablet Take 1 tablet by mouth daily.   oxyCODONE  (OXY IR/ROXICODONE) 5 MG immediate release tablet Take 1-2 tablets (5-10 mg total) by mouth every 6 (six) hours as needed for moderate pain (pain score 4-6).   polyethylene glycol (MIRALAX / GLYCOLAX) 17 g packet Take 17 g by mouth daily as needed for mild constipation.   trolamine salicylate (BLUE-EMU HEMP) 10 % cream Apply 1 Application topically as needed for muscle pain.   Vibegron (GEMTESA) 75 MG TABS Take 1 tablet (75 mg total) by mouth daily.   Vitamin D, Ergocalciferol, (DRISDOL) 1.25 MG (50000 UNIT) CAPS capsule Take 50,000 Units by mouth every 7 (seven) days.   Zinc 100 MG TABS Take 100 mg by mouth 2 (two) times a week.   [DISCONTINUED] Docusate Sodium (DSS) 100 MG CAPS Take 1,000 mg by mouth in the morning, at noon, in the evening, and at bedtime.   [DISCONTINUED] mometasone (ELOCON) 0.1 % cream Apply topically to affected areas behind ears and neck twice daily for itchy rash use for up to 2 weeks. Can also use at itchy areas at abdomen. (Patient taking differently: Apply 1 Application topically daily as needed (rash/itching).)   [DISCONTINUED] POTASSIUM PO Take 3 tablets by mouth daily.   No facility-administered encounter medications on file as of 05/23/2022.    Review of Systems  Vitals:   05/23/22 0840  BP: 109/70  Pulse: 86  Resp: 14  Temp: (!) 97.5 F (36.4 C)  SpO2: 97%  Weight: 196 lb 6.4 oz (89.1 kg)  Height:  (1.626 m)   Body mass index is 33.71 kg/m. Physical Exam Constitutional:      Appearance: Normal appearance.  Cardiovascular:     Rate and Rhythm: Normal rate and regular rhythm.     Pulses: Normal pulses.     Heart sounds: Normal heart sounds.  Pulmonary:     Effort: Pulmonary effort is normal.  Skin:    Comments: Wound bandage dry and intact.   Neurological:     Mental Status: She is alert and  oriented to person, place, and time.     Labs reviewed: Basic Metabolic Panel: Recent Labs    01/05/22 1146 02/01/22 0000 05/03/22 1200  05/16/22 0146  NA 142 142 140 139  K 4.0 3.7 4.0 4.1  CL 105 105 102 104  CO2 30 28* 28 28  GLUCOSE 80  --  84 120*  BUN 23 20 23 12   CREATININE 0.60 0.8 0.75 0.80  CALCIUM 9.0 8.7 9.3 8.3*   Liver Function Tests: Recent Labs    07/06/21 1058 01/05/22 1146 02/01/22 0000 05/03/22 1200  AST 13* 15 25 15   ALT 14 8 10 8   ALKPHOS 51 62 81 59  BILITOT 0.9 0.5  --  0.7  PROT 6.5 6.4  --  6.2*  ALBUMIN 3.9 4.0 3.5 3.8   No results for input(s): "LIPASE", "AMYLASE" in the last 8760 hours. No results for input(s): "AMMONIA" in the last 8760 hours. CBC: Recent Labs    10/23/21 0448 02/01/22 0000 05/03/22 1200 05/16/22 0146 05/17/22 0126  WBC 7.4   < > 5.1 6.1 8.9  NEUTROABS 4.1  --   --   --   --   HGB 13.2   < > 13.0 9.7* 9.4*  HCT 41.1   < > 39.4 28.5* 27.8*  MCV 98.6  --  91.8 89.9 89.4  PLT 151   < > 138* 118* 105*   < > = values in this interval not displayed.   Cardiac Enzymes: No results for input(s): "CKTOTAL", "CKMB", "CKMBINDEX", "TROPONINI" in the last 8760 hours. BNP: Invalid input(s): "POCBNP" Lab Results  Component Value Date   HGBA1C 5.6 07/07/2021   Lab Results  Component Value Date   TSH 2.31 01/05/2022   No results found for: "VITAMINB12" No results found for: "FOLATE" No results found for: "IRON", "TIBC", "FERRITIN"  Imaging and Procedures obtained prior to SNF admission: DG Pelvis Portable  Result Date: 05/15/2022 CLINICAL DATA:  Postop right hip replacement. EXAM: PORTABLE PELVIS 1-2 VIEWS COMPARISON:  Operative images, obtained earlier today. Prior exam, 02/22/2022. FINDINGS: Right total hip arthroplasty appears well seated and well aligned on the single AP image. No acute fracture or evidence of an operative complication. IMPRESSION: Well-positioned total right hip arthroplasty. Electronically Signed   By: Amie Portland M.D.   On: 05/15/2022 12:29   DG HIP UNILAT WITH PELVIS 1V RIGHT  Result Date: 05/15/2022 CLINICAL DATA:  Total hip  arthroplasty anterior approach. Intraoperative fluoroscopy. EXAM: DG HIP (WITH OR WITHOUT PELVIS) 1V RIGHT COMPARISON:  Pelvis and right hip radiographs 02/22/2022 FINDINGS: Images were performed intraoperatively without the presence of a radiologist. Interval total right hip arthroplasty. No hardware complication is seen. Total fluoroscopy images: 3 Total fluoroscopy time: 23 seconds Total dose: Radiation Exposure Index (as provided by the fluoroscopic device): 2.538 mGy air Kerma Please see intraoperative findings for further detail. IMPRESSION: Intraoperative fluoroscopy for total right hip arthroplasty. Electronically Signed   By: Neita Garnet M.D.   On: 05/15/2022 11:56   DG C-Arm 1-60 Min-No Report  Result Date: 05/15/2022 Fluoroscopy was utilized by the requesting physician.  No radiographic interpretation.   DG C-Arm 1-60 Min-No Report  Result Date: 05/15/2022 Fluoroscopy was utilized by the requesting physician.  No radiographic interpretation.    Assessment/Plan Status post total replacement of right hip  Aortic stenosis, moderate  Postmenopausal estrogen deficiency  Rheumatoid arthritis, involving unspecified site, unspecified whether rheumatoid factor present Patient admitted for therapy after hip arthroplasty. Pain well-controlled. Continues management with pain  control with pain management.taking hydrocodone and methocarbamol no changes in mental status, will continue q6 hours as outlined above. Continue miralax for bowel regimen. BP well-controlled. Discussed concern for high doses of vitamin D and Zinc and impact on absorption. Plan to check Vitamin D level. D/c Zinc supplement. Continue plaquenil. Patient previously on celebrex for RA, however, discontinued during hospitalization for ASA. Continue ASA 81 mg.    Family/ staff Communication: Nursing  Labs/tests ordered: CBC, Vitamin D in 1 week.

## 2022-05-25 ENCOUNTER — Other Ambulatory Visit: Payer: Self-pay | Admitting: Student

## 2022-05-25 DIAGNOSIS — Z96641 Presence of right artificial hip joint: Secondary | ICD-10-CM

## 2022-05-25 MED ORDER — OXYCODONE HCL 5 MG PO TABS
5.0000 mg | ORAL_TABLET | Freq: Four times a day (QID) | ORAL | 0 refills | Status: AC | PRN
Start: 2022-05-25 — End: 2022-06-24

## 2022-05-25 MED ORDER — OXYCODONE HCL 5 MG PO TABS
5.0000 mg | ORAL_TABLET | Freq: Four times a day (QID) | ORAL | 0 refills | Status: DC | PRN
Start: 2022-05-25 — End: 2022-05-25

## 2022-05-25 NOTE — Progress Notes (Signed)
Refill of pain medication for SNF.

## 2022-05-25 NOTE — Addendum Note (Signed)
Addended by: Earnestine Mealing on: 05/25/2022 11:39 AM   Modules accepted: Orders

## 2022-05-27 DIAGNOSIS — M6281 Muscle weakness (generalized): Secondary | ICD-10-CM | POA: Diagnosis not present

## 2022-05-27 DIAGNOSIS — Z471 Aftercare following joint replacement surgery: Secondary | ICD-10-CM | POA: Diagnosis not present

## 2022-05-27 DIAGNOSIS — K219 Gastro-esophageal reflux disease without esophagitis: Secondary | ICD-10-CM | POA: Diagnosis not present

## 2022-05-27 DIAGNOSIS — N3281 Overactive bladder: Secondary | ICD-10-CM | POA: Diagnosis not present

## 2022-05-27 DIAGNOSIS — R2689 Other abnormalities of gait and mobility: Secondary | ICD-10-CM | POA: Diagnosis not present

## 2022-05-27 DIAGNOSIS — Z96641 Presence of right artificial hip joint: Secondary | ICD-10-CM | POA: Diagnosis not present

## 2022-05-27 DIAGNOSIS — I10 Essential (primary) hypertension: Secondary | ICD-10-CM | POA: Diagnosis not present

## 2022-05-27 DIAGNOSIS — M069 Rheumatoid arthritis, unspecified: Secondary | ICD-10-CM | POA: Diagnosis not present

## 2022-05-27 DIAGNOSIS — K760 Fatty (change of) liver, not elsewhere classified: Secondary | ICD-10-CM | POA: Diagnosis not present

## 2022-05-28 ENCOUNTER — Ambulatory Visit (INDEPENDENT_AMBULATORY_CARE_PROVIDER_SITE_OTHER): Payer: Medicare PPO | Admitting: Orthopaedic Surgery

## 2022-05-28 ENCOUNTER — Encounter: Payer: Self-pay | Admitting: Orthopaedic Surgery

## 2022-05-28 DIAGNOSIS — Z96641 Presence of right artificial hip joint: Secondary | ICD-10-CM

## 2022-05-28 NOTE — Progress Notes (Signed)
The patient is here for her first postoperative visit status post a right total hip arthroplasty.  She had a significant and severe deformity of her right hip and a significant leg length difference that we could not get her legs back all the way but she is incredibly close.  She feels a big difference overall.  She has been convalescing and recovering in a skilled nursing facility and she is going home today.  She is 72 years old.  She states that she has been told that she does have bone-on-bone wear of her right knee.  Her right hip incision looks good.  The staples are removed and Steri-Strips applied.  She tolerates me easily putting her hip through range of motion on the right side.  There is a slight leg length difference with her right side little shorter than the left.  She will continue to increase her activities as comfort allows.  I am fine with her having some visits from home health therapy.  I would like to see her back in 4 weeks and for her I would like a standing low AP pelvis at that visit so we can get a better idea of what her leg lengths are looking like so then we can send her to [facility] for a permanent orthotic for her right side.  We can eventually address her right knee as she recovers from the right hip.

## 2022-05-29 ENCOUNTER — Telehealth: Payer: Self-pay | Admitting: *Deleted

## 2022-05-29 NOTE — Telephone Encounter (Signed)
Ortho bundle 14 day in office visit completed. 

## 2022-05-30 DIAGNOSIS — M48061 Spinal stenosis, lumbar region without neurogenic claudication: Secondary | ICD-10-CM | POA: Diagnosis not present

## 2022-05-30 DIAGNOSIS — I2510 Atherosclerotic heart disease of native coronary artery without angina pectoris: Secondary | ICD-10-CM | POA: Diagnosis not present

## 2022-05-30 DIAGNOSIS — G250 Essential tremor: Secondary | ICD-10-CM | POA: Diagnosis not present

## 2022-05-30 DIAGNOSIS — N3281 Overactive bladder: Secondary | ICD-10-CM | POA: Diagnosis not present

## 2022-05-30 DIAGNOSIS — I10 Essential (primary) hypertension: Secondary | ICD-10-CM | POA: Diagnosis not present

## 2022-05-30 DIAGNOSIS — K219 Gastro-esophageal reflux disease without esophagitis: Secondary | ICD-10-CM | POA: Diagnosis not present

## 2022-05-30 DIAGNOSIS — M5136 Other intervertebral disc degeneration, lumbar region: Secondary | ICD-10-CM | POA: Diagnosis not present

## 2022-05-30 DIAGNOSIS — Z96641 Presence of right artificial hip joint: Secondary | ICD-10-CM | POA: Diagnosis not present

## 2022-05-30 DIAGNOSIS — Z471 Aftercare following joint replacement surgery: Secondary | ICD-10-CM | POA: Diagnosis not present

## 2022-05-31 DIAGNOSIS — I2510 Atherosclerotic heart disease of native coronary artery without angina pectoris: Secondary | ICD-10-CM | POA: Diagnosis not present

## 2022-05-31 DIAGNOSIS — M5136 Other intervertebral disc degeneration, lumbar region: Secondary | ICD-10-CM | POA: Diagnosis not present

## 2022-05-31 DIAGNOSIS — I10 Essential (primary) hypertension: Secondary | ICD-10-CM | POA: Diagnosis not present

## 2022-05-31 DIAGNOSIS — M48061 Spinal stenosis, lumbar region without neurogenic claudication: Secondary | ICD-10-CM | POA: Diagnosis not present

## 2022-05-31 DIAGNOSIS — G250 Essential tremor: Secondary | ICD-10-CM | POA: Diagnosis not present

## 2022-05-31 DIAGNOSIS — Z471 Aftercare following joint replacement surgery: Secondary | ICD-10-CM | POA: Diagnosis not present

## 2022-05-31 DIAGNOSIS — K219 Gastro-esophageal reflux disease without esophagitis: Secondary | ICD-10-CM | POA: Diagnosis not present

## 2022-05-31 DIAGNOSIS — Z96641 Presence of right artificial hip joint: Secondary | ICD-10-CM | POA: Diagnosis not present

## 2022-05-31 DIAGNOSIS — N3281 Overactive bladder: Secondary | ICD-10-CM | POA: Diagnosis not present

## 2022-06-01 ENCOUNTER — Encounter: Payer: Self-pay | Admitting: Family Medicine

## 2022-06-01 ENCOUNTER — Ambulatory Visit (INDEPENDENT_AMBULATORY_CARE_PROVIDER_SITE_OTHER): Payer: Medicare PPO | Admitting: Family Medicine

## 2022-06-01 VITALS — BP 122/78 | HR 90 | Temp 98.0°F | Ht 64.0 in | Wt 201.8 lb

## 2022-06-01 DIAGNOSIS — Z96641 Presence of right artificial hip joint: Secondary | ICD-10-CM | POA: Diagnosis not present

## 2022-06-01 DIAGNOSIS — M5136 Other intervertebral disc degeneration, lumbar region: Secondary | ICD-10-CM | POA: Diagnosis not present

## 2022-06-01 DIAGNOSIS — N3281 Overactive bladder: Secondary | ICD-10-CM | POA: Diagnosis not present

## 2022-06-01 DIAGNOSIS — K219 Gastro-esophageal reflux disease without esophagitis: Secondary | ICD-10-CM | POA: Diagnosis not present

## 2022-06-01 DIAGNOSIS — I10 Essential (primary) hypertension: Secondary | ICD-10-CM | POA: Diagnosis not present

## 2022-06-01 DIAGNOSIS — Z1231 Encounter for screening mammogram for malignant neoplasm of breast: Secondary | ICD-10-CM

## 2022-06-01 DIAGNOSIS — M48061 Spinal stenosis, lumbar region without neurogenic claudication: Secondary | ICD-10-CM | POA: Diagnosis not present

## 2022-06-01 DIAGNOSIS — G250 Essential tremor: Secondary | ICD-10-CM | POA: Diagnosis not present

## 2022-06-01 DIAGNOSIS — I2510 Atherosclerotic heart disease of native coronary artery without angina pectoris: Secondary | ICD-10-CM | POA: Diagnosis not present

## 2022-06-01 DIAGNOSIS — Z471 Aftercare following joint replacement surgery: Secondary | ICD-10-CM | POA: Diagnosis not present

## 2022-06-01 LAB — POCT URINALYSIS DIPSTICK
Bilirubin, UA: NEGATIVE
Blood, UA: NEGATIVE
Glucose, UA: NEGATIVE
Ketones, UA: NEGATIVE
Leukocytes, UA: NEGATIVE
Nitrite, UA: NEGATIVE
Protein, UA: NEGATIVE
Spec Grav, UA: 1.025 (ref 1.010–1.025)
Urobilinogen, UA: 0.2 E.U./dL
pH, UA: 6 (ref 5.0–8.0)

## 2022-06-01 NOTE — Progress Notes (Signed)
Marikay Alar, MD Phone: [phone]  Wanda Lang is a 72 y.o. female who presents today for f/u.  Status post right hip replacement: Patient has followed up with her surgeon.  Her pain is progressively improving.  It is a 5/10 at this point.  Her back does still bother her.  She is on oxycodone for her pain.  Overactive bladder: Patient tried Vesicare with little benefit.  She has been on Gemtesa for 1 to 2 weeks without much benefit.  No urinary frequency during the day though does note significant urgency and also has nocturia 3 times per night.  She initially was treated for a UTI although the urgency symptoms did not improve and was subsequently started on medication for overactive bladder.  Social History   Tobacco Use  Smoking Status Never  Smokeless Tobacco Never    Current Outpatient Medications on File Prior to Visit  Medication Sig Dispense Refill   acetaminophen (TYLENOL) 500 MG tablet Take 1,000 mg by mouth every 6 (six) hours as needed for moderate pain.     aspirin 81 MG chewable tablet Chew 1 tablet (81 mg total) by mouth 2 (two) times daily. 30 tablet 0   b complex vitamins capsule Take 1 capsule by mouth daily.     CALCIUM PO Take 1,200 mg by mouth daily.     cycloSPORINE (RESTASIS) 0.05 % ophthalmic emulsion Place 1 drop into both eyes 2 (two) times daily.     docusate sodium (COLACE) 100 MG capsule Take 100 mg by mouth 2 (two) times daily.     estradiol (VIVELLE-DOT) 0.05 MG/24HR patch Place 1 patch onto the skin 2 (two) times a week.     Flaxseed, Linseed, (FLAXSEED OIL) 1200 MG CAPS Take 1,200 mg by mouth daily.     Glucosamine HCl 1000 MG TABS Take 1,000 mg by mouth daily.     hydroxychloroquine (PLAQUENIL) 200 MG tablet Take 200 mg by mouth 2 (two) times daily.     lidocaine (LIDODERM) 5 % Place 1 patch onto the skin every 12 (twelve) hours. Remove & Discard patch within 12 hours or as directed by MD 10 patch 1   Magnesium 100 MG TABS Take 100 mg by mouth  2 (two) times a week.     methocarbamol (ROBAXIN) 500 MG tablet Take 500 mg by mouth every 6 (six) hours.     Multiple Vitamin (MULTIVITAMIN WITH MINERALS) TABS tablet Take 1 tablet by mouth daily.     oxyCODONE (OXY IR/ROXICODONE) 5 MG immediate release tablet Take 1-2 tablets (5-10 mg total) by mouth every 6 (six) hours as needed for moderate pain (pain score 4-6). Take 2 tablets for severe pain (7-10). 60 tablet 0   polyethylene glycol (MIRALAX / GLYCOLAX) 17 g packet Take 17 g by mouth daily as needed for mild constipation. 14 each 0   trolamine salicylate (BLUE-EMU HEMP) 10 % cream Apply 1 Application topically as needed for muscle pain.     Vibegron (GEMTESA) 75 MG TABS Take 1 tablet (75 mg total) by mouth daily. 30 tablet 2   No current facility-administered medications on file prior to visit.     ROS see history of present illness  Objective  Physical Exam Vitals:   06/01/22 1117 06/01/22 1135  BP: 124/82 122/78  Pulse: 90   Temp: 98 F (36.7 C)   SpO2: 98%     BP Readings from Last 3 Encounters:  06/01/22 122/78  05/23/22 109/70  05/18/22 108/61   Wt  Readings from Last 3 Encounters:  06/01/22 201 lb 12.8 oz (91.5 kg)  05/23/22 196 lb 6.4 oz (89.1 kg)  05/15/22 194 lb 10.7 oz (88.3 kg)    Physical Exam Constitutional:      General: She is not in acute distress.    Appearance: She is not diaphoretic.  Cardiovascular:     Rate and Rhythm: Normal rate and regular rhythm.     Heart sounds: Murmur (2/6 systolic murmur left upper sternal border) heard.  Pulmonary:     Effort: Pulmonary effort is normal.     Breath sounds: Normal breath sounds.  Skin:    General: Skin is warm and dry.  Neurological:     Mental Status: She is alert.      Assessment/Plan: Please see individual problem list.  Status post total replacement of right hip Assessment & Plan: Chronic issue.  Doing well status post hip replacement.  She will continue to see orthopedics.  She will  monitor for any signs of infection or increasing pain.   Overactive bladder Assessment & Plan: Chronic issue.  Continues to be an issue despite being on [place].  Will check urinalysis to make sure her infection has cleared up.  Discussed if the Leslye Peer is not working at this point likely is not can be helpful though she wants to try it for another 1 to 2 weeks and if it is not helpful by then we can refer her to urology.  Orders: -     POCT urinalysis dipstick  Encounter for screening mammogram for malignant neoplasm of breast -     3D Screening Mammogram, Left and Right; Future    Return in about 6 months (around 12/01/2022).   Marikay Alar, MD [facility]

## 2022-06-01 NOTE — Assessment & Plan Note (Addendum)
Chronic issue.  Doing well status post hip replacement.  She will continue to see orthopedics.  She will monitor for any signs of infection or increasing pain.

## 2022-06-01 NOTE — Patient Instructions (Signed)
Nice to see you. Will contact you with your urine result. If the Leslye Peer is not helpful over the next 1 to 2 weeks please let us know and we can refer you to urology. Please call [phone] to schedule your mammogram.

## 2022-06-01 NOTE — Assessment & Plan Note (Signed)
Chronic issue.  Continues to be an issue despite being on [place].  Will check urinalysis to make sure her infection has cleared up.  Discussed if the Leslye Peer is not working at this point likely is not can be helpful though she wants to try it for another 1 to 2 weeks and if it is not helpful by then we can refer her to urology.

## 2022-06-04 DIAGNOSIS — G250 Essential tremor: Secondary | ICD-10-CM | POA: Diagnosis not present

## 2022-06-04 DIAGNOSIS — I10 Essential (primary) hypertension: Secondary | ICD-10-CM | POA: Diagnosis not present

## 2022-06-04 DIAGNOSIS — Z96641 Presence of right artificial hip joint: Secondary | ICD-10-CM | POA: Diagnosis not present

## 2022-06-04 DIAGNOSIS — M48061 Spinal stenosis, lumbar region without neurogenic claudication: Secondary | ICD-10-CM | POA: Diagnosis not present

## 2022-06-04 DIAGNOSIS — M5136 Other intervertebral disc degeneration, lumbar region: Secondary | ICD-10-CM | POA: Diagnosis not present

## 2022-06-04 DIAGNOSIS — K219 Gastro-esophageal reflux disease without esophagitis: Secondary | ICD-10-CM | POA: Diagnosis not present

## 2022-06-04 DIAGNOSIS — Z471 Aftercare following joint replacement surgery: Secondary | ICD-10-CM | POA: Diagnosis not present

## 2022-06-04 DIAGNOSIS — I2510 Atherosclerotic heart disease of native coronary artery without angina pectoris: Secondary | ICD-10-CM | POA: Diagnosis not present

## 2022-06-04 DIAGNOSIS — N3281 Overactive bladder: Secondary | ICD-10-CM | POA: Diagnosis not present

## 2022-06-07 DIAGNOSIS — M48061 Spinal stenosis, lumbar region without neurogenic claudication: Secondary | ICD-10-CM | POA: Diagnosis not present

## 2022-06-07 DIAGNOSIS — Z96641 Presence of right artificial hip joint: Secondary | ICD-10-CM | POA: Diagnosis not present

## 2022-06-07 DIAGNOSIS — I10 Essential (primary) hypertension: Secondary | ICD-10-CM | POA: Diagnosis not present

## 2022-06-07 DIAGNOSIS — Z471 Aftercare following joint replacement surgery: Secondary | ICD-10-CM | POA: Diagnosis not present

## 2022-06-07 DIAGNOSIS — G250 Essential tremor: Secondary | ICD-10-CM | POA: Diagnosis not present

## 2022-06-07 DIAGNOSIS — K219 Gastro-esophageal reflux disease without esophagitis: Secondary | ICD-10-CM | POA: Diagnosis not present

## 2022-06-07 DIAGNOSIS — N3281 Overactive bladder: Secondary | ICD-10-CM | POA: Diagnosis not present

## 2022-06-07 DIAGNOSIS — I2510 Atherosclerotic heart disease of native coronary artery without angina pectoris: Secondary | ICD-10-CM | POA: Diagnosis not present

## 2022-06-07 DIAGNOSIS — M5136 Other intervertebral disc degeneration, lumbar region: Secondary | ICD-10-CM | POA: Diagnosis not present

## 2022-06-14 DIAGNOSIS — K219 Gastro-esophageal reflux disease without esophagitis: Secondary | ICD-10-CM | POA: Diagnosis not present

## 2022-06-14 DIAGNOSIS — M5136 Other intervertebral disc degeneration, lumbar region: Secondary | ICD-10-CM | POA: Diagnosis not present

## 2022-06-14 DIAGNOSIS — I10 Essential (primary) hypertension: Secondary | ICD-10-CM | POA: Diagnosis not present

## 2022-06-14 DIAGNOSIS — G250 Essential tremor: Secondary | ICD-10-CM | POA: Diagnosis not present

## 2022-06-14 DIAGNOSIS — Z96641 Presence of right artificial hip joint: Secondary | ICD-10-CM | POA: Diagnosis not present

## 2022-06-14 DIAGNOSIS — M48061 Spinal stenosis, lumbar region without neurogenic claudication: Secondary | ICD-10-CM | POA: Diagnosis not present

## 2022-06-14 DIAGNOSIS — N3281 Overactive bladder: Secondary | ICD-10-CM | POA: Diagnosis not present

## 2022-06-14 DIAGNOSIS — Z471 Aftercare following joint replacement surgery: Secondary | ICD-10-CM | POA: Diagnosis not present

## 2022-06-14 DIAGNOSIS — I2510 Atherosclerotic heart disease of native coronary artery without angina pectoris: Secondary | ICD-10-CM | POA: Diagnosis not present

## 2022-06-20 DIAGNOSIS — Z96641 Presence of right artificial hip joint: Secondary | ICD-10-CM | POA: Diagnosis not present

## 2022-06-20 DIAGNOSIS — M5136 Other intervertebral disc degeneration, lumbar region: Secondary | ICD-10-CM | POA: Diagnosis not present

## 2022-06-20 DIAGNOSIS — Z471 Aftercare following joint replacement surgery: Secondary | ICD-10-CM | POA: Diagnosis not present

## 2022-06-20 DIAGNOSIS — K219 Gastro-esophageal reflux disease without esophagitis: Secondary | ICD-10-CM | POA: Diagnosis not present

## 2022-06-20 DIAGNOSIS — M48061 Spinal stenosis, lumbar region without neurogenic claudication: Secondary | ICD-10-CM | POA: Diagnosis not present

## 2022-06-20 DIAGNOSIS — G250 Essential tremor: Secondary | ICD-10-CM | POA: Diagnosis not present

## 2022-06-20 DIAGNOSIS — I10 Essential (primary) hypertension: Secondary | ICD-10-CM | POA: Diagnosis not present

## 2022-06-20 DIAGNOSIS — N3281 Overactive bladder: Secondary | ICD-10-CM | POA: Diagnosis not present

## 2022-06-20 DIAGNOSIS — I2510 Atherosclerotic heart disease of native coronary artery without angina pectoris: Secondary | ICD-10-CM | POA: Diagnosis not present

## 2022-06-27 ENCOUNTER — Ambulatory Visit (INDEPENDENT_AMBULATORY_CARE_PROVIDER_SITE_OTHER): Payer: Medicare PPO | Admitting: Orthopaedic Surgery

## 2022-06-27 ENCOUNTER — Encounter: Payer: Self-pay | Admitting: Orthopaedic Surgery

## 2022-06-27 ENCOUNTER — Telehealth: Payer: Self-pay | Admitting: *Deleted

## 2022-06-27 ENCOUNTER — Other Ambulatory Visit (INDEPENDENT_AMBULATORY_CARE_PROVIDER_SITE_OTHER): Payer: Medicare PPO

## 2022-06-27 ENCOUNTER — Other Ambulatory Visit: Payer: Self-pay | Admitting: *Deleted

## 2022-06-27 DIAGNOSIS — K219 Gastro-esophageal reflux disease without esophagitis: Secondary | ICD-10-CM | POA: Diagnosis not present

## 2022-06-27 DIAGNOSIS — Z96641 Presence of right artificial hip joint: Secondary | ICD-10-CM

## 2022-06-27 DIAGNOSIS — G250 Essential tremor: Secondary | ICD-10-CM | POA: Diagnosis not present

## 2022-06-27 DIAGNOSIS — I10 Essential (primary) hypertension: Secondary | ICD-10-CM | POA: Diagnosis not present

## 2022-06-27 DIAGNOSIS — M48061 Spinal stenosis, lumbar region without neurogenic claudication: Secondary | ICD-10-CM | POA: Diagnosis not present

## 2022-06-27 DIAGNOSIS — M2170 Unequal limb length (acquired), unspecified site: Secondary | ICD-10-CM

## 2022-06-27 DIAGNOSIS — R262 Difficulty in walking, not elsewhere classified: Secondary | ICD-10-CM

## 2022-06-27 DIAGNOSIS — M159 Polyosteoarthritis, unspecified: Secondary | ICD-10-CM

## 2022-06-27 DIAGNOSIS — I2510 Atherosclerotic heart disease of native coronary artery without angina pectoris: Secondary | ICD-10-CM | POA: Diagnosis not present

## 2022-06-27 DIAGNOSIS — Z471 Aftercare following joint replacement surgery: Secondary | ICD-10-CM | POA: Diagnosis not present

## 2022-06-27 DIAGNOSIS — N3281 Overactive bladder: Secondary | ICD-10-CM | POA: Diagnosis not present

## 2022-06-27 DIAGNOSIS — M5136 Other intervertebral disc degeneration, lumbar region: Secondary | ICD-10-CM | POA: Diagnosis not present

## 2022-06-27 NOTE — Progress Notes (Signed)
The patient is now between 6 and 7 weeks status post a right total hip replacement.  She had a significant leg length discrepancy before surgery.  We got her almost fully out the length but she still just a little short.  She does walk with a walking stick.  She feels much better than what she was before surgery.  Her right hip is severely deformed.  She is in need of outpatient physical therapy for overall strengthening and conditioning.  She is 72 years old.  Right hip moves smoothly and fluidly.  I am pleased overall.  A standing AP pelvis and lateral of the right hip shows a well-seated total hip arthroplasty with no complicating features.  We will send her to [facility] for custom insert for right shoe.  Will also set up outpatient physical therapy for overall conditioning with any modalities per the therapist discretion.  There is no restrictions for her right hip.  She can stop her aspirin and can drive.  Will see her back in 3 months to see how she is doing overall with a final standing AP pelvis.

## 2022-06-27 NOTE — Telephone Encounter (Signed)
Ortho bundle in office visit completed. 

## 2022-06-28 DIAGNOSIS — M0589 Other rheumatoid arthritis with rheumatoid factor of multiple sites: Secondary | ICD-10-CM | POA: Diagnosis not present

## 2022-06-29 LAB — LAB REPORT - SCANNED: EGFR: 89

## 2022-07-04 DIAGNOSIS — G250 Essential tremor: Secondary | ICD-10-CM | POA: Diagnosis not present

## 2022-07-04 DIAGNOSIS — M48061 Spinal stenosis, lumbar region without neurogenic claudication: Secondary | ICD-10-CM | POA: Diagnosis not present

## 2022-07-04 DIAGNOSIS — K219 Gastro-esophageal reflux disease without esophagitis: Secondary | ICD-10-CM | POA: Diagnosis not present

## 2022-07-04 DIAGNOSIS — I10 Essential (primary) hypertension: Secondary | ICD-10-CM | POA: Diagnosis not present

## 2022-07-04 DIAGNOSIS — I2510 Atherosclerotic heart disease of native coronary artery without angina pectoris: Secondary | ICD-10-CM | POA: Diagnosis not present

## 2022-07-04 DIAGNOSIS — M5136 Other intervertebral disc degeneration, lumbar region: Secondary | ICD-10-CM | POA: Diagnosis not present

## 2022-07-04 DIAGNOSIS — N3281 Overactive bladder: Secondary | ICD-10-CM | POA: Diagnosis not present

## 2022-07-04 DIAGNOSIS — Z96641 Presence of right artificial hip joint: Secondary | ICD-10-CM | POA: Diagnosis not present

## 2022-07-04 DIAGNOSIS — Z471 Aftercare following joint replacement surgery: Secondary | ICD-10-CM | POA: Diagnosis not present

## 2022-07-10 DIAGNOSIS — G250 Essential tremor: Secondary | ICD-10-CM | POA: Diagnosis not present

## 2022-07-10 DIAGNOSIS — M5136 Other intervertebral disc degeneration, lumbar region: Secondary | ICD-10-CM | POA: Diagnosis not present

## 2022-07-10 DIAGNOSIS — K219 Gastro-esophageal reflux disease without esophagitis: Secondary | ICD-10-CM | POA: Diagnosis not present

## 2022-07-10 DIAGNOSIS — N3281 Overactive bladder: Secondary | ICD-10-CM | POA: Diagnosis not present

## 2022-07-10 DIAGNOSIS — Z471 Aftercare following joint replacement surgery: Secondary | ICD-10-CM | POA: Diagnosis not present

## 2022-07-10 DIAGNOSIS — I10 Essential (primary) hypertension: Secondary | ICD-10-CM | POA: Diagnosis not present

## 2022-07-10 DIAGNOSIS — M48061 Spinal stenosis, lumbar region without neurogenic claudication: Secondary | ICD-10-CM | POA: Diagnosis not present

## 2022-07-10 DIAGNOSIS — I2510 Atherosclerotic heart disease of native coronary artery without angina pectoris: Secondary | ICD-10-CM | POA: Diagnosis not present

## 2022-07-10 DIAGNOSIS — Z96641 Presence of right artificial hip joint: Secondary | ICD-10-CM | POA: Diagnosis not present

## 2022-07-18 DIAGNOSIS — M19019 Primary osteoarthritis, unspecified shoulder: Secondary | ICD-10-CM | POA: Diagnosis not present

## 2022-07-18 DIAGNOSIS — M25511 Pain in right shoulder: Secondary | ICD-10-CM | POA: Diagnosis not present

## 2022-07-19 ENCOUNTER — Ambulatory Visit (INDEPENDENT_AMBULATORY_CARE_PROVIDER_SITE_OTHER): Payer: Medicare PPO

## 2022-07-19 VITALS — BP 122/78 | Ht 64.54 in | Wt 191.0 lb

## 2022-07-19 DIAGNOSIS — N3281 Overactive bladder: Secondary | ICD-10-CM | POA: Diagnosis not present

## 2022-07-19 DIAGNOSIS — Z96641 Presence of right artificial hip joint: Secondary | ICD-10-CM | POA: Diagnosis not present

## 2022-07-19 DIAGNOSIS — I2510 Atherosclerotic heart disease of native coronary artery without angina pectoris: Secondary | ICD-10-CM | POA: Diagnosis not present

## 2022-07-19 DIAGNOSIS — M48061 Spinal stenosis, lumbar region without neurogenic claudication: Secondary | ICD-10-CM | POA: Diagnosis not present

## 2022-07-19 DIAGNOSIS — I10 Essential (primary) hypertension: Secondary | ICD-10-CM | POA: Diagnosis not present

## 2022-07-19 DIAGNOSIS — K219 Gastro-esophageal reflux disease without esophagitis: Secondary | ICD-10-CM | POA: Diagnosis not present

## 2022-07-19 DIAGNOSIS — M5136 Other intervertebral disc degeneration, lumbar region: Secondary | ICD-10-CM | POA: Diagnosis not present

## 2022-07-19 DIAGNOSIS — G250 Essential tremor: Secondary | ICD-10-CM | POA: Diagnosis not present

## 2022-07-19 DIAGNOSIS — Z0000 Encounter for general adult medical examination without abnormal findings: Secondary | ICD-10-CM

## 2022-07-19 DIAGNOSIS — Z471 Aftercare following joint replacement surgery: Secondary | ICD-10-CM | POA: Diagnosis not present

## 2022-07-19 NOTE — Progress Notes (Addendum)
I connected with  Wanda Lang on 07/19/22 by a audio enabled telemedicine application and verified that I am speaking with the correct person using two identifiers.  Patient Location: Home  Provider Location: Home Office  I discussed the limitations of evaluation and management by telemedicine. The patient expressed understanding and agreed to proceed.  Patient Medicare AWV questionnaire was completed by the patient on 07/18/22; I have confirmed that all information answered by patient is correct and no changes since this date.    Subjective:   Wanda Lang is a 72 y.o. female who presents for Medicare Annual (Subsequent) preventive examination.  Review of Systems     Cardiac Risk Factors include: advanced age (>2men, >28 women);obesity (BMI >30kg/m2);sedentary lifestyle     Objective:    Today's Vitals   07/19/22 1006 07/19/22 1007  BP: 122/78   Weight: 191 lb (86.6 kg)   Height: 5' 4.54" (1.639 m)   PainSc:  2    Body mass index is 32.24 kg/m.     07/19/2022   10:26 AM 05/23/2022    8:49 AM 05/03/2022   11:17 AM 02/15/2022    3:52 PM 02/15/2022    3:51 PM 10/23/2021    4:33 PM 10/22/2021   11:21 PM  Advanced Directives  Does Patient Have a Medical Advance Directive? No No No No  No No  Does patient want to make changes to medical advance directive?  No - Patient declined   No - Patient declined    Would patient like information on creating a medical advance directive? No - Patient declined  No - Patient declined No - Patient declined  Yes (Inpatient - patient requests chaplain consult to create a medical advance directive)     Current Medications (verified) Outpatient Encounter Medications as of 07/19/2022  Medication Sig   acetaminophen (TYLENOL) 500 MG tablet Take 1,000 mg by mouth every 6 (six) hours as needed for moderate pain.   b complex vitamins capsule Take 1 capsule by mouth daily.   CALCIUM PO Take 1,200 mg by mouth daily.   celecoxib (CELEBREX) 200  MG capsule Take 200 mg by mouth daily.   estradiol (VIVELLE-DOT) 0.05 MG/24HR patch Place 1 patch onto the skin 2 (two) times a week.   Flaxseed, Linseed, (FLAXSEED OIL) 1200 MG CAPS Take 1,200 mg by mouth daily.   Glucosamine HCl 1000 MG TABS Take 1,000 mg by mouth daily.   hydroxychloroquine (PLAQUENIL) 200 MG tablet Take 200 mg by mouth 2 (two) times daily.   Magnesium 100 MG TABS Take 100 mg by mouth 2 (two) times a week.   Multiple Vitamin (MULTIVITAMIN WITH MINERALS) TABS tablet Take 1 tablet by mouth daily.   Probiotic Product (PROBIOTIC DAILY PO) Take 1 Dose by mouth daily.   trolamine salicylate (BLUE-EMU HEMP) 10 % cream Apply 1 Application topically as needed for muscle pain.   Vitamin D, Ergocalciferol, (DRISDOL) 1.25 MG (50000 UNIT) CAPS capsule Take 50,000 Units by mouth every 14 (fourteen) days.   aspirin 81 MG chewable tablet Chew 1 tablet (81 mg total) by mouth 2 (two) times daily.   cycloSPORINE (RESTASIS) 0.05 % ophthalmic emulsion Place 1 drop into both eyes 2 (two) times daily. (Patient not taking: Reported on 07/19/2022)   docusate sodium (COLACE) 100 MG capsule Take 100 mg by mouth 2 (two) times daily.   methocarbamol (ROBAXIN) 500 MG tablet Take 500 mg by mouth every 6 (six) hours. (Patient not taking: Reported on 07/19/2022)  polyethylene glycol (MIRALAX / GLYCOLAX) 17 g packet Take 17 g by mouth daily as needed for mild constipation.   Vibegron (GEMTESA) 75 MG TABS Take 1 tablet (75 mg total) by mouth daily.   No facility-administered encounter medications on file as of 07/19/2022.    Allergies (verified) Penicillins, Amoxicillin, Misc. sulfonamide containing compounds, Rosanil cleanser [sulfacetamide sodium-sulfur], Sulfonamide derivatives, and Ylang-ylang [cananga oil (ylang-ylang)]   History: Past Medical History:  Diagnosis Date   Allergy    Arthritis    Coronary artery disease    Fatty liver    GERD (gastroesophageal reflux disease)    Heart murmur     Hypertension    Hx   LIVER FUNCTION TESTS, ABNORMAL, HX OF 09/08/2007   Qualifier: Diagnosis of  By: Nelson-Smith CMA (AAMA), Dottie     Neuromuscular disorder [facility])    Essential Tremor   Overactive bladder    Past Surgical History:  Procedure Laterality Date   ABDOMINAL HYSTERECTOMY     COLONOSCOPY WITH PROPOFOL N/A 09/04/2018   Procedure: COLONOSCOPY WITH PROPOFOL;  Surgeon: Pasty Spillers, MD;  Location: [facility] ENDOSCOPY;  Service: Endoscopy;  Laterality: N/A;   ESOPHAGOGASTRODUODENOSCOPY (EGD) WITH PROPOFOL N/A 09/04/2018   Procedure: ESOPHAGOGASTRODUODENOSCOPY (EGD) WITH PROPOFOL;  Surgeon: Pasty Spillers, MD;  Location: [facility] ENDOSCOPY;  Service: Endoscopy;  Laterality: N/A;   LAPAROSCOPY     for endometriosis x2   TONSILLECTOMY     TOTAL HIP ARTHROPLASTY Right 05/15/2022   Procedure: RIGHT TOTAL HIP ARTHROPLASTY ANTERIOR APPROACH;  Surgeon: Kathryne Hitch, MD;  Location: [facility] OR;  Service: Orthopedics;  Laterality: Right;   TOTAL KNEE ARTHROPLASTY Left 07/18/2021   Procedure: TOTAL KNEE ARTHROPLASTY;  Surgeon: Durene Romans, MD;  Location: [facility] ORS;  Service: Orthopedics;  Laterality: Left;   TURBINATE REDUCTION     Family History  Problem Relation Age of Onset   Alcohol abuse Mother    Arthritis Mother    Hyperlipidemia Mother    Heart disease Mother    Stroke Mother    Hypertension Mother    Depression Mother    Anxiety disorder Mother    Arthritis Father    Lung cancer Paternal Grandfather    Kidney disease Paternal Grandfather    Social History   Socioeconomic History   Marital status: Widowed    Spouse name: Not on file   Number of children: Not on file   Years of education: Not on file   Highest education level: Not on file  Occupational History   Not on file  Tobacco Use   Smoking status: Never   Smokeless tobacco: Never  Vaping Use   Vaping Use: Never used  Substance and Sexual Activity   Alcohol use: Not Currently    Alcohol/week: 1.0  standard drink of alcohol    Types: 1 Glasses of wine per week   Drug use: No   Sexual activity: Not Currently  Other Topics Concern   Not on file  Social History Narrative   Not on file   Social Determinants of Health   Financial Resource Strain: Low Risk  (07/19/2022)   Overall Financial Resource Strain (CARDIA)    Difficulty of Paying Living Expenses: Not hard at all  Food Insecurity: No Food Insecurity (07/19/2022)   Hunger Vital Sign    Worried About Running Out of Food in the Last Year: Never true    Ran Out of Food in the Last Year: Never true  Transportation Needs: No Transportation Needs (07/19/2022)   PRAPARE -  Administrator, Civil Service (Medical): No    Lack of Transportation (Non-Medical): No  Physical Activity: Insufficiently Active (07/19/2022)   Exercise Vital Sign    Days of Exercise per Week: 1 day    Minutes of Exercise per Session: 20 min  Stress: No Stress Concern Present (07/19/2022)   [employer] of Occupational Health - Occupational Stress Questionnaire    Feeling of Stress : Not at all  Social Connections: Moderately Integrated (07/19/2022)   Social Connection and Isolation Panel [NHANES]    Frequency of Communication with Friends and Family: More than three times a week    Frequency of Social Gatherings with Friends and Family: Once a week    Attends Religious Services: More than 4 times per year    Active Member of [employer] or Organizations: Yes    Attends Banker Meetings: More than 4 times per year    Marital Status: Widowed    Tobacco Counseling Counseling given: Yes   Clinical Intake:  Pre-visit preparation completed: Yes  Pain : 0-10 Pain Score: 2  Pain Type: Chronic pain Pain Location: Back Pain Orientation: Lower Pain Descriptors / Indicators: Aching Pain Onset: More than a month ago Pain Frequency: Several days a week     BMI - recorded: 32.24 Nutritional Status: BMI > 30  Obese Nutritional Risks:  None Diabetes: No  How often do you need to have someone help you when you read instructions, pamphlets, or other written materials from your doctor or pharmacy?: 1 - Never  Diabetic?No  Interpreter Needed?: No  Information entered by :: Abby Sumi Lye CMA   Activities of Daily Living    07/19/2022   10:27 AM 07/18/2022    6:20 PM  In your present state of health, do you have any difficulty performing the following activities:  Hearing? 0 0  Vision? 1 1  Difficulty concentrating or making decisions? 0 0  Walking or climbing stairs? 1 1  Comment uses cane   Dressing or bathing? 1 1  Comment uses a dressing stick and device to help her pull her socks on   Doing errands, shopping? 1 1  Comment help available when needed   Preparing Food and eating ? N N  Using the Toilet? N N  In the past six months, have you accidently leaked urine? Y Y  Do you have problems with loss of bowel control? Y Y  Managing your Medications? N N  Managing your Finances? N N  Housekeeping or managing your Housekeeping? Malvin Johns    Patient Care Team: Glori Luis, MD as PCP - General (Family Medicine)  Indicate any recent Medical Services you may have received from other than [facility] providers in the past year (date may be approximate).     Assessment:   This is a routine wellness examination for Wanda Lang.  Hearing/Vision screen Hearing Screening - Comments:: Patient denies any hearing difficulties.   Vision Screening - Comments:: Wears rx glasses - up to date with routine eye exams with  [facility]  Dietary issues and exercise activities discussed: Current Exercise Habits: The patient does not participate in regular exercise at present, Exercise limited by: orthopedic condition(s)   Goals Addressed               This Visit's Progress     Patient Stated (pt-stated)        Patient's goal is to get full mobility back, address back pain issues to see if  there is anything that can  be done to help that, and lose some more weight       Depression Screen    07/19/2022   10:15 AM 06/01/2022   11:20 AM 04/04/2022    3:07 PM 03/08/2022   10:01 AM 02/02/2022    1:41 PM 10/16/2021    4:01 PM 08/24/2021    9:21 AM  PHQ 2/9 Scores  PHQ - 2 Score 0 0 1 0 0 0 0  PHQ- 9 Score 4 3     1     Fall Risk    07/19/2022   10:26 AM 07/18/2022    6:20 PM 06/01/2022   11:20 AM 04/04/2022    3:06 PM 03/08/2022   10:04 AM  Fall Risk   Falls in the past year? 0 0 0 0 0  Number falls in past yr: 0 0 0 0   Injury with Fall? 0  0 0   Risk for fall due to : No Fall Risks  No Fall Risks No Fall Risks   Follow up Falls prevention discussed  Falls evaluation completed Falls evaluation completed     FALL RISK PREVENTION PERTAINING TO THE HOME:  Any stairs in or around the home? No  If so, are there any without handrails? No  Home free of loose throw rugs in walkways, pet beds, electrical cords, etc? Yes  Adequate lighting in your home to reduce risk of falls? Yes   ASSISTIVE DEVICES UTILIZED TO PREVENT FALLS:  Life alert? No  Use of a cane, walker or w/c? Yes  Grab bars in the bathroom? Yes  Shower chair or bench in shower? Yes  Elevated toilet seat or a handicapped toilet? No   TIMED UP AND GO:  Was the test performed? No .  Cognitive Function:        07/19/2022   10:17 AM 02/03/2019    9:58 AM  6CIT Screen  What Year? 0 points 0 points  What month? 0 points 0 points  What time? 0 points 0 points  Count back from 20 0 points 0 points  Months in reverse 0 points 0 points  Repeat phrase 0 points 0 points  Total Score 0 points 0 points    Immunizations Immunization History  Administered Date(s) Administered   Fluad Quad(high Dose 65+) 11/30/2020   Hep A / Hep B 08/22/2017, 09/24/2017, 02/26/2018   Hep B, Unspecified 02/14/2018   Hepatitis A, Adult 02/14/2018   Influenza Inj Mdck Quad Pf 01/02/2016   Influenza, Quadrivalent, Recombinant, Inj, Pf 01/16/2018    Influenza,inj,quad, With Preservative 11/27/2018   Influenza-Unspecified 11/30/2011, 02/19/2013, 01/21/2014   PFIZER(Purple Top)SARS-COV-2 Vaccination 04/02/2019, 04/28/2019, 11/19/2019, 09/09/2020   PNEUMOCOCCAL CONJUGATE-20 11/30/2020   Zoster Recombinat (Shingrix) 12/25/2018, 06/04/2019    TDAP status: Due, Education has been provided regarding the importance of this vaccine. Advised may receive this vaccine at local pharmacy or Health Dept. Aware to provide a copy of the vaccination record if obtained from local pharmacy or Health Dept. Verbalized acceptance and understanding.  Flu Vaccine status: Up to date  Pneumococcal vaccine status: Up to date  Covid-19 vaccine status: Information provided on how to obtain vaccines.   Qualifies for Shingles Vaccine? Yes   Zostavax completed Yes   Shingrix Completed?: Yes  Screening Tests Health Maintenance  Topic Date Due   DTaP/Tdap/Td (1 - Tdap) Never done   MAMMOGRAM  06/07/2018   COVID-19 Vaccine (5 - 2023-24 season) 10/06/2021   INFLUENZA VACCINE  09/06/2022  Medicare Annual Wellness (AWV)  07/19/2023   Colonoscopy  09/03/2028   Pneumonia Vaccine 23+ Years old  Completed   DEXA SCAN  Completed   Hepatitis C Screening  Completed   Zoster Vaccines- Shingrix  Completed   HPV VACCINES  Aged Out    Health Maintenance  Health Maintenance Due  Topic Date Due   DTaP/Tdap/Td (1 - Tdap) Never done   MAMMOGRAM  06/07/2018   COVID-19 Vaccine (5 - 2023-24 season) 10/06/2021    Colorectal cancer screening: Type of screening: Colonoscopy. Completed 09/04/2018. Repeat every 10 years  Mammogram status: Completed 2024. Repeat every year  Bone Density status: Completed 2024. Results reflect: Bone density results: OSTEOPENIA. Repeat every 2-5 years.  Lung Cancer Screening: (Low Dose CT Chest recommended if Age 51-80 years, 30 pack-year currently smoking OR have quit w/in 15years.) does not qualify.   Additional Screening:  Hepatitis C  Screening: does not qualify; Completed 08/12/2017  Vision Screening: Recommended annual ophthalmology exams for early detection of glaucoma and other disorders of the eye. Is the patient up to date with their annual eye exam?  Yes  Who is the provider or what is the name of the office in which the patient attends annual eye exams? [facility] If pt is not established with a provider, would they like to be referred to a provider to establish care? No .   Dental Screening: Recommended annual dental exams for proper oral hygiene  Community Resource Referral / Chronic Care Management: CRR required this visit?  No   CCM required this visit?  No      Plan:     I have personally reviewed and noted the following in the patient's chart:   Medical and social history Use of alcohol, tobacco or illicit drugs  Current medications and supplements including opioid prescriptions. Patient is not currently taking opioid prescriptions. Functional ability and status Nutritional status Physical activity Advanced directives List of other physicians Hospitalizations, surgeries, and ER visits in previous 12 months Vitals Screenings to include cognitive, depression, and falls Referrals and appointments  In addition, I have reviewed and discussed with patient certain preventive protocols, quality metrics, and best practice recommendations. A written personalized care plan for preventive services as well as general preventive health recommendations were provided to patient.   Due to this being a telephonic visit, the after visit summary with patients personalized plan was offered to patient via mail or my-chart. Patient would like to access their AVS via my-chart    Jordan Hawks Inaara Tye, CMA   07/19/2022   Nurse Notes: Patient has had her mammogram and Dexa Scan this year. Notes need to be requested from Dr.Chen at [facility]    I have reviewed the above information and agree  with above.   Duncan Dull, MD

## 2022-07-19 NOTE — Patient Instructions (Addendum)
Wanda Lang , Thank you for taking time to come for your Medicare Wellness Visit. I appreciate your ongoing commitment to your health goals. Please review the following plan we discussed and let me know if I can assist you in the future.   These are the goals we discussed:  Goals       Patient Stated (pt-stated)      Patient's goal is to get full mobility back, address back pain issues to see if there is anything that can be done to help that, and lose some more weight      Weight loss goal 177lb      Continue [facility] Diet        This is a list of the screening recommended for you and due dates:  Health Maintenance  Topic Date Due   DTaP/Tdap/Td vaccine (1 - Tdap) Never done   Mammogram  06/07/2018   COVID-19 Vaccine (5 - 2023-24 season) 10/06/2021   Flu Shot  09/06/2022   Medicare Annual Wellness Visit  07/19/2023   Colon Cancer Screening  09/03/2028   Pneumonia Vaccine  Completed   DEXA scan (bone density measurement)  Completed   Hepatitis C Screening  Completed   Zoster (Shingles) Vaccine  Completed   HPV Vaccine  Aged Out    Advanced directives: Advance directive discussed with you today. Even though you declined this today, please call our office should you change your mind, and we can give you the proper paperwork for you to fill out. Advance care planning is a way to make decisions about medical care that fits your values in case you are ever unable to make these decisions for yourself.  Information on Advanced Care Planning can be found at [facility] Advance Health Care Directives Advance Health Care Directives ([email or web address]    Conditions/risks identified: Aim for 30 minutes of exercise or brisk walking, 6-8 glasses of water, and 5 servings of fruits and vegetables each day.   Next appointment: Follow up in one year for your annual wellness visit   July 23, 2023 at 10:00am TELEPHONE VISIT   Preventive Care 72 Years and Older,  Female Preventive care refers to lifestyle choices and visits with your health care provider that can promote health and wellness. What does preventive care include? A yearly physical exam. This is also called an annual well check. Dental exams once or twice a year. Routine eye exams. Ask your health care provider how often you should have your eyes checked. Personal lifestyle choices, including: Daily care of your teeth and gums. Regular physical activity. Eating a healthy diet. Avoiding tobacco and drug use. Limiting alcohol use. Practicing safe sex. Taking low-dose aspirin every day. Taking vitamin and mineral supplements as recommended by your health care provider. What happens during an annual well check? The services and screenings done by your health care provider during your annual well check will depend on your age, overall health, lifestyle risk factors, and family history of disease. Counseling  Your health care provider may ask you questions about your: Alcohol use. Tobacco use. Drug use. Emotional well-being. Home and relationship well-being. Sexual activity. Eating habits. History of falls. Memory and ability to understand (cognition). Work and work Astronomer. Reproductive health. Screening  You may have the following tests or measurements: Height, weight, and BMI. Blood pressure. Lipid and cholesterol levels. These may be checked every 5 years, or more frequently if you are over 60 years old. Skin check. Lung cancer screening.  You may have this screening every year starting at age 80 if you have a 30-pack-year history of smoking and currently smoke or have quit within the past 15 years. Fecal occult blood test (FOBT) of the stool. You may have this test every year starting at age 28. Flexible sigmoidoscopy or colonoscopy. You may have a sigmoidoscopy every 5 years or a colonoscopy every 10 years starting at age 73. Hepatitis C blood test. Hepatitis B blood  test. Sexually transmitted disease (STD) testing. Diabetes screening. This is done by checking your blood sugar (glucose) after you have not eaten for a while (fasting). You may have this done every 1-3 years. Bone density scan. This is done to screen for osteoporosis. You may have this done starting at age 40. Mammogram. This may be done every 1-2 years. Talk to your health care provider about how often you should have regular mammograms. Talk with your health care provider about your test results, treatment options, and if necessary, the need for more tests. Vaccines  Your health care provider may recommend certain vaccines, such as: Influenza vaccine. This is recommended every year. Tetanus, diphtheria, and acellular pertussis (Tdap, Td) vaccine. You may need a Td booster every 10 years. Zoster vaccine. You may need this after age 78. Pneumococcal 13-valent conjugate (PCV13) vaccine. One dose is recommended after age 89. Pneumococcal polysaccharide (PPSV23) vaccine. One dose is recommended after age 38. Talk to your health care provider about which screenings and vaccines you need and how often you need them. This information is not intended to replace advice given to you by your health care provider. Make sure you discuss any questions you have with your health care provider. Document Released: 02/18/2015 Document Revised: 10/12/2015 Document Reviewed: 11/23/2014 Elsevier Interactive Patient Education  2017 [employer].  Fall Prevention in the Home Falls can cause injuries. They can happen to people of all ages. There are many things you can do to make your home safe and to help prevent falls. What can I do on the outside of my home? Regularly fix the edges of walkways and driveways and fix any cracks. Remove anything that might make you trip as you walk through a door, such as a raised step or threshold. Trim any bushes or trees on the path to your home. Use bright outdoor  lighting. Clear any walking paths of anything that might make someone trip, such as rocks or tools. Regularly check to see if handrails are loose or broken. Make sure that both sides of any steps have handrails. Any raised decks and porches should have guardrails on the edges. Have any leaves, snow, or ice cleared regularly. Use sand or salt on walking paths during winter. Clean up any spills in your garage right away. This includes oil or grease spills. What can I do in the bathroom? Use night lights. Install grab bars by the toilet and in the tub and shower. Do not use towel bars as grab bars. Use non-skid mats or decals in the tub or shower. If you need to sit down in the shower, use a plastic, non-slip stool. Keep the floor dry. Clean up any water that spills on the floor as soon as it happens. Remove soap buildup in the tub or shower regularly. Attach bath mats securely with double-sided non-slip rug tape. Do not have throw rugs and other things on the floor that can make you trip. What can I do in the bedroom? Use night lights. Make sure that you have  a light by your bed that is easy to reach. Do not use any sheets or blankets that are too big for your bed. They should not hang down onto the floor. Have a firm chair that has side arms. You can use this for support while you get dressed. Do not have throw rugs and other things on the floor that can make you trip. What can I do in the kitchen? Clean up any spills right away. Avoid walking on wet floors. Keep items that you use a lot in easy-to-reach places. If you need to reach something above you, use a strong step stool that has a grab bar. Keep electrical cords out of the way. Do not use floor polish or wax that makes floors slippery. If you must use wax, use non-skid floor wax. Do not have throw rugs and other things on the floor that can make you trip. What can I do with my stairs? Do not leave any items on the stairs. Make  sure that there are handrails on both sides of the stairs and use them. Fix handrails that are broken or loose. Make sure that handrails are as long as the stairways. Check any carpeting to make sure that it is firmly attached to the stairs. Fix any carpet that is loose or worn. Avoid having throw rugs at the top or bottom of the stairs. If you do have throw rugs, attach them to the floor with carpet tape. Make sure that you have a light switch at the top of the stairs and the bottom of the stairs. If you do not have them, ask someone to add them for you. What else can I do to help prevent falls? Wear shoes that: Do not have high heels. Have rubber bottoms. Are comfortable and fit you well. Are closed at the toe. Do not wear sandals. If you use a stepladder: Make sure that it is fully opened. Do not climb a closed stepladder. Make sure that both sides of the stepladder are locked into place. Ask someone to hold it for you, if possible. Clearly mark and make sure that you can see: Any grab bars or handrails. First and last steps. Where the edge of each step is. Use tools that help you move around (mobility aids) if they are needed. These include: Canes. Walkers. Scooters. Crutches. Turn on the lights when you go into a dark area. Replace any light bulbs as soon as they burn out. Set up your furniture so you have a clear path. Avoid moving your furniture around. If any of your floors are uneven, fix them. If there are any pets around you, be aware of where they are. Review your medicines with your doctor. Some medicines can make you feel dizzy. This can increase your chance of falling. Ask your doctor what other things that you can do to help prevent falls. This information is not intended to replace advice given to you by your health care provider. Make sure you discuss any questions you have with your health care provider. Document Released: 11/18/2008 Document Revised: 06/30/2015  Document Reviewed: 02/26/2014 Elsevier Interactive Patient Education  2017 [employer].

## 2022-07-26 DIAGNOSIS — M0589 Other rheumatoid arthritis with rheumatoid factor of multiple sites: Secondary | ICD-10-CM | POA: Diagnosis not present

## 2022-07-27 DIAGNOSIS — Z471 Aftercare following joint replacement surgery: Secondary | ICD-10-CM | POA: Diagnosis not present

## 2022-07-27 DIAGNOSIS — I10 Essential (primary) hypertension: Secondary | ICD-10-CM | POA: Diagnosis not present

## 2022-07-27 DIAGNOSIS — N3281 Overactive bladder: Secondary | ICD-10-CM | POA: Diagnosis not present

## 2022-07-27 DIAGNOSIS — I2510 Atherosclerotic heart disease of native coronary artery without angina pectoris: Secondary | ICD-10-CM | POA: Diagnosis not present

## 2022-07-27 DIAGNOSIS — M48061 Spinal stenosis, lumbar region without neurogenic claudication: Secondary | ICD-10-CM | POA: Diagnosis not present

## 2022-07-27 DIAGNOSIS — M5136 Other intervertebral disc degeneration, lumbar region: Secondary | ICD-10-CM | POA: Diagnosis not present

## 2022-07-27 DIAGNOSIS — G250 Essential tremor: Secondary | ICD-10-CM | POA: Diagnosis not present

## 2022-07-27 DIAGNOSIS — Z96641 Presence of right artificial hip joint: Secondary | ICD-10-CM | POA: Diagnosis not present

## 2022-07-27 DIAGNOSIS — K219 Gastro-esophageal reflux disease without esophagitis: Secondary | ICD-10-CM | POA: Diagnosis not present

## 2022-07-31 ENCOUNTER — Telehealth: Payer: Self-pay | Admitting: Orthopaedic Surgery

## 2022-07-31 DIAGNOSIS — Z79899 Other long term (current) drug therapy: Secondary | ICD-10-CM | POA: Diagnosis not present

## 2022-07-31 DIAGNOSIS — M1710 Unilateral primary osteoarthritis, unspecified knee: Secondary | ICD-10-CM | POA: Diagnosis not present

## 2022-07-31 DIAGNOSIS — M25559 Pain in unspecified hip: Secondary | ICD-10-CM | POA: Diagnosis not present

## 2022-07-31 DIAGNOSIS — M0589 Other rheumatoid arthritis with rheumatoid factor of multiple sites: Secondary | ICD-10-CM | POA: Diagnosis not present

## 2022-07-31 DIAGNOSIS — M2140 Flat foot [pes planus] (acquired), unspecified foot: Secondary | ICD-10-CM | POA: Diagnosis not present

## 2022-07-31 DIAGNOSIS — M150 Primary generalized (osteo)arthritis: Secondary | ICD-10-CM | POA: Diagnosis not present

## 2022-07-31 DIAGNOSIS — J302 Other seasonal allergic rhinitis: Secondary | ICD-10-CM | POA: Diagnosis not present

## 2022-07-31 DIAGNOSIS — M549 Dorsalgia, unspecified: Secondary | ICD-10-CM | POA: Diagnosis not present

## 2022-07-31 NOTE — Telephone Encounter (Signed)
Pt called asking for a call back from Dr Magnus Ivan about her back pains. Pt states she dont want to make an appt until she speaks to him. Pt phone number is [phone].

## 2022-08-02 DIAGNOSIS — M069 Rheumatoid arthritis, unspecified: Secondary | ICD-10-CM | POA: Diagnosis not present

## 2022-08-02 DIAGNOSIS — M5416 Radiculopathy, lumbar region: Secondary | ICD-10-CM | POA: Diagnosis not present

## 2022-08-02 DIAGNOSIS — M2042 Other hammer toe(s) (acquired), left foot: Secondary | ICD-10-CM | POA: Diagnosis not present

## 2022-08-02 DIAGNOSIS — M533 Sacrococcygeal disorders, not elsewhere classified: Secondary | ICD-10-CM | POA: Diagnosis not present

## 2022-08-02 DIAGNOSIS — M5136 Other intervertebral disc degeneration, lumbar region: Secondary | ICD-10-CM | POA: Diagnosis not present

## 2022-08-07 ENCOUNTER — Encounter (HOSPITAL_BASED_OUTPATIENT_CLINIC_OR_DEPARTMENT_OTHER): Payer: Self-pay | Admitting: Physical Therapy

## 2022-08-07 ENCOUNTER — Ambulatory Visit (HOSPITAL_BASED_OUTPATIENT_CLINIC_OR_DEPARTMENT_OTHER): Payer: Medicare PPO | Attending: Physical Medicine & Rehabilitation | Admitting: Physical Therapy

## 2022-08-07 ENCOUNTER — Other Ambulatory Visit: Payer: Self-pay

## 2022-08-07 DIAGNOSIS — M5459 Other low back pain: Secondary | ICD-10-CM | POA: Diagnosis not present

## 2022-08-07 DIAGNOSIS — M159 Polyosteoarthritis, unspecified: Secondary | ICD-10-CM | POA: Diagnosis not present

## 2022-08-07 DIAGNOSIS — M6281 Muscle weakness (generalized): Secondary | ICD-10-CM | POA: Insufficient documentation

## 2022-08-07 DIAGNOSIS — R2689 Other abnormalities of gait and mobility: Secondary | ICD-10-CM | POA: Insufficient documentation

## 2022-08-07 DIAGNOSIS — M25551 Pain in right hip: Secondary | ICD-10-CM | POA: Insufficient documentation

## 2022-08-07 DIAGNOSIS — R262 Difficulty in walking, not elsewhere classified: Secondary | ICD-10-CM | POA: Insufficient documentation

## 2022-08-07 NOTE — Therapy (Signed)
OUTPATIENT PHYSICAL THERAPY EVALUATION   Patient Name: NICEY KECK MRN: [id number] DOB:[birth year], 72 y.o., female Today's Date: 08/08/2022  END OF SESSION:  PT End of Session - 08/07/22 1237     Visit Number 1    Number of Visits 10    Date for PT Re-Evaluation 10/03/22    Authorization Type human[employer] MCR    Progress Note Due on Visit 10    PT Start Time 1233    PT Stop Time 1315    PT Time Calculation (min) 42 min    Activity Tolerance Patient tolerated treatment well    Behavior During Therapy WFL for tasks assessed/performed             Past Medical History:  Diagnosis Date   Allergy    Arthritis    Coronary artery disease    Fatty liver    GERD (gastroesophageal reflux disease)    Heart murmur    Hypertension    Hx   LIVER FUNCTION TESTS, ABNORMAL, HX OF 09/08/2007   Qualifier: Diagnosis of  By: Nelson-Smith CMA (AAMA), Dottie     Neuromuscular disorder (HCC)    Essential Tremor   Overactive bladder    Past Surgical History:  Procedure Laterality Date   ABDOMINAL HYSTERECTOMY     COLONOSCOPY WITH PROPOFOL N/A 09/04/2018   Procedure: COLONOSCOPY WITH PROPOFOL;  Surgeon: Pasty Spillers, MD;  Location: [facility] ENDOSCOPY;  Service: Endoscopy;  Laterality: N/A;   ESOPHAGOGASTRODUODENOSCOPY (EGD) WITH PROPOFOL N/A 09/04/2018   Procedure: ESOPHAGOGASTRODUODENOSCOPY (EGD) WITH PROPOFOL;  Surgeon: Pasty Spillers, MD;  Location: [facility] ENDOSCOPY;  Service: Endoscopy;  Laterality: N/A;   LAPAROSCOPY     for endometriosis x2   TONSILLECTOMY     TOTAL HIP ARTHROPLASTY Right 05/15/2022   Procedure: RIGHT TOTAL HIP ARTHROPLASTY ANTERIOR APPROACH;  Surgeon: Kathryne Hitch, MD;  Location: [facility] OR;  Service: Orthopedics;  Laterality: Right;   TOTAL KNEE ARTHROPLASTY Left 07/18/2021   Procedure: TOTAL KNEE ARTHROPLASTY;  Surgeon: Durene Romans, MD;  Location: [facility] ORS;  Service: Orthopedics;  Laterality: Left;   TURBINATE REDUCTION     Patient Active Problem  List   Diagnosis Date Noted   S/P total hip arthroplasty 05/17/2022   Status post total replacement of right hip 05/15/2022   Overactive bladder 04/04/2022   Postmenopausal estrogen deficiency 11/13/2021   Primary osteoarthritis 11/13/2021   Leg length inequality 11/06/2021   Impaired ambulation 10/23/2021   Contact dermatitis 10/16/2021   SI joint arthritis 08/24/2021   Spinal stenosis, lumbar region, without neurogenic claudication 08/24/2021   Degeneration of lumbar intervertebral disc 07/24/2021   S/P total knee arthroplasty, left 07/18/2021   Preop examination 07/07/2021   Right hip pain 07/07/2021   Low back pain 07/07/2021   Drug-induced immunodeficiency (HCC) 06/12/2021   Impingement syndrome of right shoulder region 06/12/2021   COVID-19 01/24/2021   Heart murmur 11/24/2020   Hammer toe 04/22/2020   Metatarsalgia of left foot 04/22/2020   Osteoarthritis of midfoot 04/22/2020   Duodenal ulcer 11/12/2019   Stress 11/12/2019   Morbid obesity (HCC) 11/06/2019   Pain in left knee 07/30/2019   Aortic stenosis, moderate 02/06/2018   Maxillary sinusitis, chronic 10/24/2017   Chronic diarrhea 10/09/2017   Acid reflux 07/15/2017   Asymptomatic carotid artery stenosis 07/15/2017   Cyst of skin 07/15/2017   Immunocompromised (HCC) 11/09/2016   Allergic rhinitis 11/02/2016   Benign essential tremor 11/02/2016   History of hypertension 11/02/2016   Postmenopausal symptoms 07/01/2015   Seasonal  allergic rhinitis due to pollen 07/01/2015   Fatty liver 09/08/2007   Rheumatoid arthritis (HCC) 09/08/2007    REFERRING PROVIDER:   Kathryne Hitch, MD    REFERRING DIAG:  R26.2 (ICD-10-CM) - Impaired ambulation  M15.9 (ICD-10-CM) - Primary osteoarthritis involving multiple joints  S/p Rt THA 05/15/22  Rationale for Evaluation and Treatment: Rehabilitation  THERAPY DIAG:  Pain in right hip  Muscle weakness (generalized)  Other abnormalities of gait and  mobility  ONSET DATE: THA 05/15/22, deconditioned prior to surgery   SUBJECTIVE:                                                                                                                                                                                           SUBJECTIVE STATEMENT: I was doing so well and getting around well. I got my lifted shoe and felt so great. All of the sudden my back started hurting and I am back on meds and using walking stick.   PERTINENT HISTORY:  LLD noted prior to surgery & unable to fully correct  PAIN:  Are you having pain? Yes: NPRS scale: 7/10 Pain location: Rt SIJ and to sit bone Pain description: sore Aggravating factors: shifting weight from one foot to the other Relieving factors: stand still  PRECAUTIONS:  Fall  WEIGHT BEARING RESTRICTIONS:  No  FALLS:  Has patient fallen in last 6 months? No  LIVING ENVIRONMENT: Lives with: lives alone Lives in: House/apartment Stairs: yes, also has stair lift Has following equipment at home:  walking stick   PLOF:  Independent  PATIENT GOALS:  Decrease pain, day to day stuff   OBJECTIVE:   PATIENT SURVEYS:  FOTO 38  COGNITIVE STATUS: Within functional limits for tasks assessed   RED FLAGS: None    SENSATION: WFL  POSTURE:  Eval: decr lumbar lordosis in standing, slight forward flexion, weight shifted to the Lt  GAIT: EVAL: antalgic gait with walking stick, +Rt trendelenburg   Body Part #1 Hip  PALPATION: EVAL: limited mobility in Rt SIJ; notable anterior rotation of Rt innominate concordant pain in trigger points in Rt gluts, piriformis    FUNCTIONAL TESTS:  Timed up and go (TUG): NT at eval due to pain 3 minute walk test: NT at eval 30s chair stand test     TREATMENT:  DATE: EVAL 08/07/22 MANUAL: Rt sacral PA & inf spring mobs; STM Rt  post hip  See HEP    PATIENT EDUCATION:  Education details: Anatomy of condition, POC, HEP, exercise form/rationale Person educated: Patient Education method: Explanation, Demonstration, Tactile cues, Verbal cues, and Handouts Education comprehension: verbalized understanding, returned demonstration, verbal cues required, tactile cues required, and needs further education  HOME EXERCISE PROGRAM: [id number]   ASSESSMENT:  CLINICAL IMPRESSION: Patient is a 72 y.o. F who was seen today for physical therapy evaluation and treatment for s/p Rt THA and deconditioning. Pt reports she was doing really well following HHPT but has had an increase in concordant pain prior to surgery in Rt SIJ and post hip.    OBJECTIVE IMPAIRMENTS: Abnormal gait, decreased activity tolerance, decreased endurance, difficulty walking, decreased ROM, decreased strength, increased muscle spasms, improper body mechanics, postural dysfunction, and pain.   ACTIVITY LIMITATIONS: carrying, lifting, bending, sitting, squatting, stairs, transfers, bed mobility, bathing, and locomotion level  PARTICIPATION LIMITATIONS: meal prep, cleaning, laundry, shopping, community activity, occupation, yard work, and church  PERSONAL FACTORS: see PMH  REHAB POTENTIAL: Good  CLINICAL DECISION MAKING: Evolving/moderate complexity  EVALUATION COMPLEXITY: Moderate   GOALS: Goals reviewed with patient? Yes  SHORT TERM GOALS: Target date: 7/26  Able to demo proper hip ext in toe off phase of gait Baseline: Goal status: INITIAL   LONG TERM GOALS: Target date: POC date  Meet FOTO goal Baseline:  Goal status: INITIAL  2.  Find location to continue aquatic HEP Baseline:  Goal status: INITIAL  3.  5TSTS to 20s or less Baseline:  Goal status: INITIAL  4.  TUG to 13.5s to indicate low fall risk and proper functional ability Baseline:  Goal status: INITIAL    PLAN:  PT FREQUENCY: 1-2x/week  PT DURATION: 8  weeks  PLANNED INTERVENTIONS: Therapeutic exercises, Therapeutic activity, Neuromuscular re-education, Balance training, Gait training, Patient/Family education, Self Care, Joint mobilization, Stair training, Aquatic Therapy, Dry Needling, Electrical stimulation, Spinal mobilization, Cryotherapy, Moist heat, Taping, Traction, Ionotophoresis 4mg /ml Dexamethasone, Manual therapy, and Re-evaluation.  PLAN FOR NEXT SESSION: begin aquatics   Louetta Hollingshead C. Jacarri Gesner PT, DPT 08/08/22 11:27 AM  Referring diagnosis? R26.2 M15.9 Treatment diagnosis? (if different than referring diagnosis) M25.551 M62.81 R26.89 What was this (referring dx) caused by? [x]  Surgery []  Fall []  Ongoing issue []  Arthritis []  Other: ____________  Laterality: [x]  Rt []  Lt []  Both  Check all possible CPT codes:  *CHOOSE 10 OR LESS*    []  97110 (Therapeutic Exercise)  []  92507 (SLP Treatment)  []  97112 (Neuro Re-ed)   []  92526 (Swallowing Treatment)   []  97116 (Gait Training)   []  [id number] (Cognitive Training, 1st 15 minutes) []  97140 (Manual Therapy)   []  97130 (Cognitive Training, each add'l 15 minutes)  []  97164 (Re-evaluation)                              []  Other, List CPT Code ____________  []  97530 (Therapeutic Activities)     []  97535 (Self Care)   [x]  All codes above (97110 - 97535)  []  97012 (Mechanical Traction)  [x]  97014 (E-stim Unattended)  []  97032 (E-stim manual)  [x]  97033 (Ionto)  [x]  97035 (Ultrasound) [x]  97750 (Physical Performance Training) [x]  [id number] (Aquatic Therapy) []  97016 (Vasopneumatic Device) []  [id number] (Paraffin) []  97034 (Contrast Bath) []  97597 (Wound Care 1st 20 sq cm) []  97598 (Wound Care each add'l 20 sq cm) []  97760 (Orthotic Fabrication,  Fitting, Training Initial) []  16109 (Prosthetic Management and Training Initial) []  [id number] (Orthotic or Prosthetic Training/ Modification Subsequent)

## 2022-08-08 DIAGNOSIS — M533 Sacrococcygeal disorders, not elsewhere classified: Secondary | ICD-10-CM | POA: Diagnosis not present

## 2022-08-23 ENCOUNTER — Encounter (HOSPITAL_BASED_OUTPATIENT_CLINIC_OR_DEPARTMENT_OTHER): Payer: Self-pay | Admitting: Physical Therapy

## 2022-08-23 ENCOUNTER — Ambulatory Visit (HOSPITAL_BASED_OUTPATIENT_CLINIC_OR_DEPARTMENT_OTHER): Payer: Medicare PPO | Admitting: Physical Therapy

## 2022-08-23 DIAGNOSIS — M5459 Other low back pain: Secondary | ICD-10-CM

## 2022-08-23 DIAGNOSIS — R2689 Other abnormalities of gait and mobility: Secondary | ICD-10-CM | POA: Diagnosis not present

## 2022-08-23 DIAGNOSIS — M6281 Muscle weakness (generalized): Secondary | ICD-10-CM

## 2022-08-23 DIAGNOSIS — M159 Polyosteoarthritis, unspecified: Secondary | ICD-10-CM | POA: Diagnosis not present

## 2022-08-23 DIAGNOSIS — M5416 Radiculopathy, lumbar region: Secondary | ICD-10-CM | POA: Diagnosis not present

## 2022-08-23 DIAGNOSIS — M25551 Pain in right hip: Secondary | ICD-10-CM

## 2022-08-23 DIAGNOSIS — M0589 Other rheumatoid arthritis with rheumatoid factor of multiple sites: Secondary | ICD-10-CM | POA: Diagnosis not present

## 2022-08-23 DIAGNOSIS — R262 Difficulty in walking, not elsewhere classified: Secondary | ICD-10-CM | POA: Diagnosis not present

## 2022-08-23 NOTE — Therapy (Signed)
OUTPATIENT PHYSICAL THERAPY EVALUATION   Patient Name: Wanda Lang MRN: [id number] DOB:Mar 09, 1950, 72 y.o., female Today's Date: 08/23/2022  END OF SESSION:  PT End of Session - 08/23/22 1414     Visit Number 2    Number of Visits 10    Date for PT Re-Evaluation 10/03/22    Authorization Type humana MCR    PT Start Time 1400    PT Stop Time 1440    PT Time Calculation (min) 40 min    Behavior During Therapy WFL for tasks assessed/performed             Past Medical History:  Diagnosis Date   Allergy    Arthritis    Coronary artery disease    Fatty liver    GERD (gastroesophageal reflux disease)    Heart murmur    Hypertension    Hx   LIVER FUNCTION TESTS, ABNORMAL, HX OF 09/08/2007   Qualifier: Diagnosis of  By: Nelson-Smith CMA (AAMA), Dottie     Neuromuscular disorder (HCC)    Essential Tremor   Overactive bladder    Past Surgical History:  Procedure Laterality Date   ABDOMINAL HYSTERECTOMY     COLONOSCOPY WITH PROPOFOL N/A 09/04/2018   Procedure: COLONOSCOPY WITH PROPOFOL;  Surgeon: Pasty Spillers, MD;  Location: [facility];  Service: Endoscopy;  Laterality: N/A;   ESOPHAGOGASTRODUODENOSCOPY (EGD) WITH PROPOFOL N/A 09/04/2018   Procedure: ESOPHAGOGASTRODUODENOSCOPY (EGD) WITH PROPOFOL;  Surgeon: Pasty Spillers, MD;  Location: [facility];  Service: Endoscopy;  Laterality: N/A;   LAPAROSCOPY     for endometriosis x2   TONSILLECTOMY     TOTAL HIP ARTHROPLASTY Right 05/15/2022   Procedure: RIGHT TOTAL HIP ARTHROPLASTY ANTERIOR APPROACH;  Surgeon: Kathryne Hitch, MD;  Location: [facility];  Service: Orthopedics;  Laterality: Right;   TOTAL KNEE ARTHROPLASTY Left 07/18/2021   Procedure: TOTAL KNEE ARTHROPLASTY;  Surgeon: Durene Romans, MD;  Location: [facility];  Service: Orthopedics;  Laterality: Left;   TURBINATE REDUCTION     Patient Active Problem List   Diagnosis Date Noted   S/P total hip arthroplasty 05/17/2022   Status post total  replacement of right hip 05/15/2022   Overactive bladder 04/04/2022   Postmenopausal estrogen deficiency 11/13/2021   Primary osteoarthritis 11/13/2021   Leg length inequality 11/06/2021   Impaired ambulation 10/23/2021   Contact dermatitis 10/16/2021   SI joint arthritis 08/24/2021   Spinal stenosis, lumbar region, without neurogenic claudication 08/24/2021   Degeneration of lumbar intervertebral disc 07/24/2021   S/P total knee arthroplasty, left 07/18/2021   Preop examination 07/07/2021   Right hip pain 07/07/2021   Low back pain 07/07/2021   Drug-induced immunodeficiency (HCC) 06/12/2021   Impingement syndrome of right shoulder region 06/12/2021   COVID-19 01/24/2021   Heart murmur 11/24/2020   Hammer toe 04/22/2020   Metatarsalgia of left foot 04/22/2020   Osteoarthritis of midfoot 04/22/2020   Duodenal ulcer 11/12/2019   Stress 11/12/2019   Morbid obesity (HCC) 11/06/2019   Pain in left knee 07/30/2019   Aortic stenosis, moderate 02/06/2018   Maxillary sinusitis, chronic 10/24/2017   Chronic diarrhea 10/09/2017   Acid reflux 07/15/2017   Asymptomatic carotid artery stenosis 07/15/2017   Cyst of skin 07/15/2017   Immunocompromised (HCC) 11/09/2016   Allergic rhinitis 11/02/2016   Benign essential tremor 11/02/2016   History of hypertension 11/02/2016   Postmenopausal symptoms 07/01/2015   Seasonal allergic rhinitis due to pollen 07/01/2015   Fatty liver 09/08/2007   Rheumatoid arthritis (HCC) 09/08/2007  REFERRING PROVIDER:   Kathryne Hitch, MD    REFERRING DIAG:  R26.2 (ICD-10-CM) - Impaired ambulation  M15.9 (ICD-10-CM) - Primary osteoarthritis involving multiple joints  S/p Rt THA 05/15/22  Rationale for Evaluation and Treatment: Rehabilitation  THERAPY DIAG:  Pain in right hip  Muscle weakness (generalized)  Other abnormalities of gait and mobility  Other low back pain  ONSET DATE: Rt THA 05/15/22, deconditioned prior to  surgery   SUBJECTIVE:                                                                                                                                                                                           SUBJECTIVE STATEMENT: Pt reports she had an injection in Rt SI 2 wks ago, and an epidural in L4-5 today.  Pt reports she was cleared to get in the water today.  She reports she walks without RW around house, unless she has something to carry.   PERTINENT HISTORY:  LLD noted prior to surgery & unable to fully correct  PAIN:  Are you having pain? No : NPRS scale: 0/10 Pain location:  Pain description:  Aggravating factors:  Relieving factors:   PRECAUTIONS:  Fall  WEIGHT BEARING RESTRICTIONS:  No  FALLS:  Has patient fallen in last 6 months? No  LIVING ENVIRONMENT: Lives with: lives alone Lives in: House/apartment Stairs: yes, also has stair lift Has following equipment at home:  walking stick   PLOF:  Independent  PATIENT GOALS:  Decrease pain, day to day stuff   OBJECTIVE:   PATIENT SURVEYS:  FOTO 38  COGNITIVE STATUS: Within functional limits for tasks assessed   RED FLAGS: None    SENSATION: WFL  POSTURE:  Eval: decr lumbar lordosis in standing, slight forward flexion, weight shifted to the Lt  GAIT: EVAL: antalgic gait with walking stick, +Rt trendelenburg   Body Part #1 Hip  PALPATION: EVAL: limited mobility in Rt SIJ; notable anterior rotation of Rt innominate concordant pain in trigger points in Rt gluts, piriformis    FUNCTIONAL TESTS:  Timed up and go (TUG): NT at eval due to pain 3 minute walk test: NT at eval 30s chair stand test     TREATMENT:  Pt seen for aquatic therapy today.  Treatment took place in water 3.5-4.75 ft in depth at the [facility] pool. Temp of water was 91.  Pt  entered/exited the pool via stairs independently in step-to pattern with bilat rail. * holding barbell:  multiple laps of forward/ backward with cues for vertical trunk * side stepping with barbell - > with arm addct -> with rainbow hand floats * farmer carry with bilat rainbow hand floats, forward/ backward -> single yellow  forward/ backward  * holding rainbow hand floats: heel/ toe raises x 10; LE swings into hip flex/ext x 10; Hip abdct/ addct x 10 each  * cycling on yellow noodle with breast stroke arms  * with UE on rails:  R step up x 10; Rt lateral step ups x 10   Pt requires the buoyancy and hydrostatic pressure of water for support, and to offload joints by unweighting joint load by at least 50 % in navel deep water and by at least 75-80% in chest to neck deep water.  Viscosity of the water is needed for resistance of strengthening. Water current perturbations provides challenge to standing balance requiring increased core activation.     PATIENT EDUCATION:  Education details:exercise form/rationale Person educated: Patient Education method: Explanation, Demonstration, Actor cues, Verbal cues,  Education comprehension: verbalized understanding, returned demonstration, verbal cues required, tactile cues required, and needs further education  HOME EXERCISE PROGRAM: [id number]   ASSESSMENT:  CLINICAL IMPRESSION: Pt has returned to therapy after R THA.  She is confident and safe if aquatic setting and able to take direction from therapist on deck.  She tolerated aquatic exercises well, without increase in symptoms.  She requires moderate cues for upright trunk with relaxed shoulders during session.  She utilized UE support on floatation device initially, but is confident without any UE support.  Goals are ongoing.     OBJECTIVE IMPAIRMENTS: Abnormal gait, decreased activity tolerance, decreased endurance, difficulty walking, decreased ROM, decreased strength, increased muscle  spasms, improper body mechanics, postural dysfunction, and pain.   ACTIVITY LIMITATIONS: carrying, lifting, bending, sitting, squatting, stairs, transfers, bed mobility, bathing, and locomotion level  PARTICIPATION LIMITATIONS: meal prep, cleaning, laundry, shopping, community activity, occupation, yard work, and church  PERSONAL FACTORS: see PMH  REHAB POTENTIAL: Good  CLINICAL DECISION MAKING: Evolving/moderate complexity  EVALUATION COMPLEXITY: Moderate   GOALS: Goals reviewed with patient? Yes  SHORT TERM GOALS: Target date: 7/26  Able to demo proper hip ext in toe off phase of gait Baseline: Goal status: INITIAL   LONG TERM GOALS: Target date: POC date  Meet FOTO goal Baseline:  Goal status: INITIAL  2.  Find location to continue aquatic HEP Baseline:  Goal status: INITIAL  3.  5TSTS to 20s or less Baseline:  Goal status: INITIAL  4.  TUG to 13.5s to indicate low fall risk and proper functional ability Baseline:  Goal status: INITIAL    PLAN:  PT FREQUENCY: 1-2x/week  PT DURATION: 8 weeks  PLANNED INTERVENTIONS: Therapeutic exercises, Therapeutic activity, Neuromuscular re-education, Balance training, Gait training, Patient/Family education, Self Care, Joint mobilization, Stair training, Aquatic Therapy, Dry Needling, Electrical stimulation, Spinal mobilization, Cryotherapy, Moist heat, Taping, Traction, Ionotophoresis 4mg /ml Dexamethasone, Manual therapy, and Re-evaluation.  PLAN FOR NEXT SESSION: continue aquatics  Mayer Camel, [place] 08/23/22 2:56 PM [facility] [address] [place], [place], [address] Phone: [phone]   Fax:  [phone]   Referring diagnosis? R26.2 M15.9 Treatment diagnosis? (if different than referring diagnosis) M25.551  M62.81 R26.89 What was this (referring dx) caused by? [x]  Surgery []  Fall []  Ongoing issue []  Arthritis []  Other:  ____________  Laterality: [x]  Rt []  Lt []  Both  Check all possible CPT codes:  *CHOOSE 10 OR LESS*    []  97110 (Therapeutic Exercise)  []  78295 (SLP Treatment)  []  97112 (Neuro Re-ed)   []  92526 (Swallowing Treatment)   []  97116 (Gait Training)   []  [id number] (Cognitive Training, 1st 15 minutes) []  97140 (Manual Therapy)   []  97130 (Cognitive Training, each add'l 15 minutes)  []  97164 (Re-evaluation)                              []  Other, List CPT Code ____________  []  97530 (Therapeutic Activities)     []  97535 (Self Care)   [x]  All codes above (97110 - 97535)  []  97012 (Mechanical Traction)  [x]  97014 (E-stim Unattended)  []  97032 (E-stim manual)  [x]  97033 (Ionto)  [x]  97035 (Ultrasound) [x]  97750 (Physical Performance Training) [x]  [id number] (Aquatic Therapy) []  97016 (Vasopneumatic Device) []  [id number] (Paraffin) []  97034 (Contrast Bath) []  97597 (Wound Care 1st 20 sq cm) []  97598 (Wound Care each add'l 20 sq cm) []  97760 (Orthotic Fabrication, Fitting, Training Initial) []  [id number] (Prosthetic Management and Training Initial) []  [id number] (Orthotic or Prosthetic Training/ Modification Subsequent)

## 2022-08-29 ENCOUNTER — Ambulatory Visit (HOSPITAL_BASED_OUTPATIENT_CLINIC_OR_DEPARTMENT_OTHER): Payer: Medicare PPO | Admitting: Physical Therapy

## 2022-08-31 ENCOUNTER — Encounter (HOSPITAL_BASED_OUTPATIENT_CLINIC_OR_DEPARTMENT_OTHER): Payer: Self-pay | Admitting: Physical Therapy

## 2022-08-31 ENCOUNTER — Ambulatory Visit (HOSPITAL_BASED_OUTPATIENT_CLINIC_OR_DEPARTMENT_OTHER): Payer: Medicare PPO | Admitting: Physical Therapy

## 2022-08-31 DIAGNOSIS — M6281 Muscle weakness (generalized): Secondary | ICD-10-CM | POA: Diagnosis not present

## 2022-08-31 DIAGNOSIS — R2689 Other abnormalities of gait and mobility: Secondary | ICD-10-CM

## 2022-08-31 DIAGNOSIS — M159 Polyosteoarthritis, unspecified: Secondary | ICD-10-CM | POA: Diagnosis not present

## 2022-08-31 DIAGNOSIS — R262 Difficulty in walking, not elsewhere classified: Secondary | ICD-10-CM | POA: Diagnosis not present

## 2022-08-31 DIAGNOSIS — M25551 Pain in right hip: Secondary | ICD-10-CM | POA: Diagnosis not present

## 2022-08-31 DIAGNOSIS — M5459 Other low back pain: Secondary | ICD-10-CM

## 2022-08-31 NOTE — Therapy (Signed)
OUTPATIENT PHYSICAL THERAPY EVALUATION   Patient Name: Wanda Lang MRN: [id number] DOB:[birth year], 72 y.o., female Today's Date: 08/31/2022  END OF SESSION:  PT End of Session - 08/31/22 1223     Visit Number 3    Number of Visits 10    Date for PT Re-Evaluation 10/03/22    Authorization Type humana MCR    PT Start Time 1205    PT Stop Time 1245    PT Time Calculation (min) 40 min    Behavior During Therapy WFL for tasks assessed/performed             Past Medical History:  Diagnosis Date   Allergy    Arthritis    Coronary artery disease    Fatty liver    GERD (gastroesophageal reflux disease)    Heart murmur    Hypertension    Hx   LIVER FUNCTION TESTS, ABNORMAL, HX OF 09/08/2007   Qualifier: Diagnosis of  By: Nelson-Smith CMA (AAMA), Dottie     Neuromuscular disorder (HCC)    Essential Tremor   Overactive bladder    Past Surgical History:  Procedure Laterality Date   ABDOMINAL HYSTERECTOMY     COLONOSCOPY WITH PROPOFOL N/A 09/04/2018   Procedure: COLONOSCOPY WITH PROPOFOL;  Surgeon: Pasty Spillers, MD;  Location: [facility];  Service: Endoscopy;  Laterality: N/A;   ESOPHAGOGASTRODUODENOSCOPY (EGD) WITH PROPOFOL N/A 09/04/2018   Procedure: ESOPHAGOGASTRODUODENOSCOPY (EGD) WITH PROPOFOL;  Surgeon: Pasty Spillers, MD;  Location: [facility];  Service: Endoscopy;  Laterality: N/A;   LAPAROSCOPY     for endometriosis x2   TONSILLECTOMY     TOTAL HIP ARTHROPLASTY Right 05/15/2022   Procedure: RIGHT TOTAL HIP ARTHROPLASTY ANTERIOR APPROACH;  Surgeon: Kathryne Hitch, MD;  Location: [facility];  Service: Orthopedics;  Laterality: Right;   TOTAL KNEE ARTHROPLASTY Left 07/18/2021   Procedure: TOTAL KNEE ARTHROPLASTY;  Surgeon: Durene Romans, MD;  Location: [facility];  Service: Orthopedics;  Laterality: Left;   TURBINATE REDUCTION     Patient Active Problem List   Diagnosis Date Noted   S/P total hip arthroplasty 05/17/2022   Status post total  replacement of right hip 05/15/2022   Overactive bladder 04/04/2022   Postmenopausal estrogen deficiency 11/13/2021   Primary osteoarthritis 11/13/2021   Leg length inequality 11/06/2021   Impaired ambulation 10/23/2021   Contact dermatitis 10/16/2021   SI joint arthritis 08/24/2021   Spinal stenosis, lumbar region, without neurogenic claudication 08/24/2021   Degeneration of lumbar intervertebral disc 07/24/2021   S/P total knee arthroplasty, left 07/18/2021   Preop examination 07/07/2021   Right hip pain 07/07/2021   Low back pain 07/07/2021   Drug-induced immunodeficiency (HCC) 06/12/2021   Impingement syndrome of right shoulder region 06/12/2021   COVID-19 01/24/2021   Heart murmur 11/24/2020   Hammer toe 04/22/2020   Metatarsalgia of left foot 04/22/2020   Osteoarthritis of midfoot 04/22/2020   Duodenal ulcer 11/12/2019   Stress 11/12/2019   Morbid obesity (HCC) 11/06/2019   Pain in left knee 07/30/2019   Aortic stenosis, moderate 02/06/2018   Maxillary sinusitis, chronic 10/24/2017   Chronic diarrhea 10/09/2017   Acid reflux 07/15/2017   Asymptomatic carotid artery stenosis 07/15/2017   Cyst of skin 07/15/2017   Immunocompromised (HCC) 11/09/2016   Allergic rhinitis 11/02/2016   Benign essential tremor 11/02/2016   History of hypertension 11/02/2016   Postmenopausal symptoms 07/01/2015   Seasonal allergic rhinitis due to pollen 07/01/2015   Fatty liver 09/08/2007   Rheumatoid arthritis (HCC) 09/08/2007  REFERRING PROVIDER:   Kathryne Hitch, MD    REFERRING DIAG:  R26.2 (ICD-10-CM) - Impaired ambulation  M15.9 (ICD-10-CM) - Primary osteoarthritis involving multiple joints  S/p Rt THA 05/15/22  Rationale for Evaluation and Treatment: Rehabilitation  THERAPY DIAG:  Pain in right hip  Muscle weakness (generalized)  Other abnormalities of gait and mobility  Other low back pain  ONSET DATE: Rt THA 05/15/22, deconditioned prior to  surgery   SUBJECTIVE:                                                                                                                                                                                           SUBJECTIVE STATEMENT: Pt good response to last session.  No pain "felt good, always feel better after therapy"  PERTINENT HISTORY:  LLD noted prior to surgery & unable to fully correct  PAIN:  Are you having pain? No : NPRS scale: 0/10 Pain location:  Pain description:  Aggravating factors:  Relieving factors:   PRECAUTIONS:  Fall  WEIGHT BEARING RESTRICTIONS:  No  FALLS:  Has patient fallen in last 6 months? No  LIVING ENVIRONMENT: Lives with: lives alone Lives in: House/apartment Stairs: yes, also has stair lift Has following equipment at home:  walking stick   PLOF:  Independent  PATIENT GOALS:  Decrease pain, day to day stuff   OBJECTIVE:   PATIENT SURVEYS:  FOTO 38  COGNITIVE STATUS: Within functional limits for tasks assessed   RED FLAGS: None    SENSATION: WFL  POSTURE:  Eval: decr lumbar lordosis in standing, slight forward flexion, weight shifted to the Lt  GAIT: EVAL: antalgic gait with walking stick, +Rt trendelenburg   Body Part #1 Hip  PALPATION: EVAL: limited mobility in Rt SIJ; notable anterior rotation of Rt innominate concordant pain in trigger points in Rt gluts, piriformis    FUNCTIONAL TESTS:  Timed up and go (TUG): NT at eval due to pain 3 minute walk test: NT at eval 30s chair stand test     TREATMENT:  Pt seen for aquatic therapy today.  Treatment took place in water 3.5-4.75 ft in depth at the [facility] pool. Temp of water was 91.  Pt entered/exited the pool via stairs independently in step-to pattern with bilat rail.  * walking unsupported multiple laps of forward/ backward  and side stepping with cues for vertical trunk * with UE on rails:  R and Left step up x 10;  *Hip hiking R/L bottom step 2 x 15.  VC and demonstration for execution * farmer carry with bilat yellow hand floats, forward/ backward -> single yellow  forward/ backward  * holding yellow hand floats: heel/ toe raises x 10; LE swings into hip flex/ext x 10; Hip abdct/ addct x 10 each  *ue support on wall and HB 3.6 ft left hip circles x 15 cw&ccw; squats x15; clams Left 2 x 15  * cycling on yellow noodle with breast stroke arms    Pt requires the buoyancy and hydrostatic pressure of water for support, and to offload joints by unweighting joint load by at least 50 % in navel deep water and by at least 75-80% in chest to neck deep water.  Viscosity of the water is needed for resistance of strengthening. Water current perturbations provides challenge to standing balance requiring increased core activation.     PATIENT EDUCATION:  Education details:exercise form/rationale Person educated: Patient Education method: Explanation, Demonstration, Actor cues, Verbal cues,  Education comprehension: verbalized understanding, returned demonstration, verbal cues required, tactile cues required, and needs further education  HOME EXERCISE PROGRAM: [email or web address]   ASSESSMENT:  CLINICAL IMPRESSION: Good toleration to progressing strengthening of Lle. Included core engagement with cues for abdominal bracing. Some difficulty with eccentric control of left quad. She demonstrates good sitting balance cycling on noodle. No reports of discomfort with progressive strengthening although she does report fatigue.  Goals ongoing     OBJECTIVE IMPAIRMENTS: Abnormal gait, decreased activity tolerance, decreased endurance, difficulty walking, decreased ROM, decreased strength, increased muscle spasms, improper body mechanics, postural dysfunction, and pain.   ACTIVITY LIMITATIONS: carrying, lifting, bending, sitting,  squatting, stairs, transfers, bed mobility, bathing, and locomotion level  PARTICIPATION LIMITATIONS: meal prep, cleaning, laundry, shopping, community activity, occupation, yard work, and church  PERSONAL FACTORS: see PMH  REHAB POTENTIAL: Good  CLINICAL DECISION MAKING: Evolving/moderate complexity  EVALUATION COMPLEXITY: Moderate   GOALS: Goals reviewed with patient? Yes  SHORT TERM GOALS: Target date: 7/26  Able to demo proper hip ext in toe off phase of gait Baseline: Goal status: INITIAL   LONG TERM GOALS: Target date: POC date  Meet FOTO goal Baseline:  Goal status: INITIAL  2.  Find location to continue aquatic HEP Baseline:  Goal status: INITIAL  3.  5TSTS to 20s or less Baseline:  Goal status: INITIAL  4.  TUG to 13.5s to indicate low fall risk and proper functional ability Baseline:  Goal status: INITIAL    PLAN:  PT FREQUENCY: 1-2x/week  PT DURATION: 8 weeks  PLANNED INTERVENTIONS: Therapeutic exercises, Therapeutic activity, Neuromuscular re-education, Balance training, Gait training, Patient/Family education, Self Care, Joint mobilization, Stair training, Aquatic Therapy, Dry Needling, Electrical stimulation, Spinal mobilization, Cryotherapy, Moist heat, Taping, Traction, Ionotophoresis 4mg /ml Dexamethasone, Manual therapy, and Re-evaluation.  PLAN FOR NEXT SESSION: continue aquatics  Corrie Dandy [place]) Greidys Deland MPT 08/31/22 12:23 PM [facility] [address] [place], [place], [address] Phone: [phone]   Fax:  [phone]   Referring diagnosis? R26.2 M15.9 Treatment diagnosis? (if different than referring diagnosis) M25.551 M62.81  R26.89 What was this (referring dx) caused by? [x]  Surgery []  Fall []  Ongoing issue []  Arthritis []  Other: ____________  Laterality: [x]  Rt []  Lt []  Both  Check all possible CPT codes:  *CHOOSE 10 OR LESS*    []  97110 (Therapeutic Exercise)  []  92507  (SLP Treatment)  []  97112 (Neuro Re-ed)   []  92526 (Swallowing Treatment)   []  97116 (Gait Training)   []  [id number] (Cognitive Training, 1st 15 minutes) []  97140 (Manual Therapy)   []  97130 (Cognitive Training, each add'l 15 minutes)  []  97164 (Re-evaluation)                              []  Other, List CPT Code ____________  []  97530 (Therapeutic Activities)     []  97535 (Self Care)   [x]  All codes above (97110 - 97535)  []  97012 (Mechanical Traction)  [x]  97014 (E-stim Unattended)  []  [address] (E-stim manual)  [x]  97033 (Ionto)  [x]  97035 (Ultrasound) [x]  97750 (Physical Performance Training) [x]  [id number] (Aquatic Therapy) []  97016 (Vasopneumatic Device) []  [id number] (Paraffin) []  97034 (Contrast Bath) []  97597 (Wound Care 1st 20 sq cm) []  97598 (Wound Care each add'l 20 sq cm) []  97760 (Orthotic Fabrication, Fitting, Training Initial) []  [id number] (Prosthetic Management and Training Initial) []  [id number] (Orthotic or Prosthetic Training/ Modification Subsequent)

## 2022-09-06 ENCOUNTER — Ambulatory Visit (HOSPITAL_BASED_OUTPATIENT_CLINIC_OR_DEPARTMENT_OTHER): Payer: Medicare PPO | Attending: Physical Medicine & Rehabilitation | Admitting: Physical Therapy

## 2022-09-06 ENCOUNTER — Encounter (HOSPITAL_BASED_OUTPATIENT_CLINIC_OR_DEPARTMENT_OTHER): Payer: Self-pay | Admitting: Physical Therapy

## 2022-09-06 DIAGNOSIS — M6281 Muscle weakness (generalized): Secondary | ICD-10-CM | POA: Insufficient documentation

## 2022-09-06 DIAGNOSIS — M25551 Pain in right hip: Secondary | ICD-10-CM | POA: Insufficient documentation

## 2022-09-06 DIAGNOSIS — R2689 Other abnormalities of gait and mobility: Secondary | ICD-10-CM | POA: Insufficient documentation

## 2022-09-06 DIAGNOSIS — M5459 Other low back pain: Secondary | ICD-10-CM | POA: Diagnosis not present

## 2022-09-06 NOTE — Therapy (Signed)
OUTPATIENT PHYSICAL THERAPY TREATMENT   Patient Name: Wanda Lang MRN: [id number] DOB:05/07/1950, 72 y.o., female Today's Date: 09/06/2022  END OF SESSION:  PT End of Session - 09/06/22 1214     Visit Number 4    Date for PT Re-Evaluation 10/03/22    Authorization Type humana MCR    Progress Note Due on Visit 10    PT Start Time 1115    PT Stop Time 1200    PT Time Calculation (min) 45 min    Activity Tolerance Patient tolerated treatment well    Behavior During Therapy WFL for tasks assessed/performed              Past Medical History:  Diagnosis Date   Allergy    Arthritis    Coronary artery disease    Fatty liver    GERD (gastroesophageal reflux disease)    Heart murmur    Hypertension    Hx   LIVER FUNCTION TESTS, ABNORMAL, HX OF 09/08/2007   Qualifier: Diagnosis of  By: Nelson-Smith CMA (AAMA), Dottie     Neuromuscular disorder (HCC)    Essential Tremor   Overactive bladder    Past Surgical History:  Procedure Laterality Date   ABDOMINAL HYSTERECTOMY     COLONOSCOPY WITH PROPOFOL N/A 09/04/2018   Procedure: COLONOSCOPY WITH PROPOFOL;  Surgeon: Pasty Spillers, MD;  Location: [facility];  Service: Endoscopy;  Laterality: N/A;   ESOPHAGOGASTRODUODENOSCOPY (EGD) WITH PROPOFOL N/A 09/04/2018   Procedure: ESOPHAGOGASTRODUODENOSCOPY (EGD) WITH PROPOFOL;  Surgeon: Pasty Spillers, MD;  Location: [facility];  Service: Endoscopy;  Laterality: N/A;   LAPAROSCOPY     for endometriosis x2   TONSILLECTOMY     TOTAL HIP ARTHROPLASTY Right 05/15/2022   Procedure: RIGHT TOTAL HIP ARTHROPLASTY ANTERIOR APPROACH;  Surgeon: Kathryne Hitch, MD;  [facility];  Service: Orthopedics;  Laterality: Right;   TOTAL KNEE ARTHROPLASTY Left 07/18/2021   Procedure: TOTAL KNEE ARTHROPLASTY;  Surgeon: Durene Romans, MD;  Location: [facility];  Service: Orthopedics;  Laterality: Left;   TURBINATE REDUCTION     Patient Active Problem List   Diagnosis Date  Noted   S/P total hip arthroplasty 05/17/2022   Status post total replacement of right hip 05/15/2022   Overactive bladder 04/04/2022   Postmenopausal estrogen deficiency 11/13/2021   Primary osteoarthritis 11/13/2021   Leg length inequality 11/06/2021   Impaired ambulation 10/23/2021   Contact dermatitis 10/16/2021   SI joint arthritis 08/24/2021   Spinal stenosis, lumbar region, without neurogenic claudication 08/24/2021   Degeneration of lumbar intervertebral disc 07/24/2021   S/P total knee arthroplasty, left 07/18/2021   Preop examination 07/07/2021   Right hip pain 07/07/2021   Low back pain 07/07/2021   Drug-induced immunodeficiency (HCC) 06/12/2021   Impingement syndrome of right shoulder region 06/12/2021   COVID-19 01/24/2021   Heart murmur 11/24/2020   Hammer toe 04/22/2020   Metatarsalgia of left foot 04/22/2020   Osteoarthritis of midfoot 04/22/2020   Duodenal ulcer 11/12/2019   Stress 11/12/2019   Morbid obesity (HCC) 11/06/2019   Pain in left knee 07/30/2019   Aortic stenosis, moderate 02/06/2018   Maxillary sinusitis, chronic 10/24/2017   Chronic diarrhea 10/09/2017   Acid reflux 07/15/2017   Asymptomatic carotid artery stenosis 07/15/2017   Cyst of skin 07/15/2017   Immunocompromised (HCC) 11/09/2016   Allergic rhinitis 11/02/2016   Benign essential tremor 11/02/2016   History of hypertension 11/02/2016   Postmenopausal symptoms 07/01/2015   Seasonal allergic rhinitis due to pollen 07/01/2015  Fatty liver 09/08/2007   Rheumatoid arthritis (HCC) 09/08/2007    REFERRING PROVIDER:   Kathryne Hitch, MD    REFERRING DIAG:  R26.2 (ICD-10-CM) - Impaired ambulation  M15.9 (ICD-10-CM) - Primary osteoarthritis involving multiple joints  S/p Rt THA 05/15/22  Rationale for Evaluation and Treatment: Rehabilitation  THERAPY DIAG:  Pain in right hip  Muscle weakness (generalized)  Other abnormalities of gait and mobility  ONSET DATE: Rt THA  05/15/22, deconditioned prior to surgery   SUBJECTIVE:                                                                                                                                                                                           SUBJECTIVE STATEMENT: Pt reports that her Rt knee has been painful, especially when attempting to stand on just Rt leg.    PERTINENT HISTORY:  LLD noted prior to surgery & unable to fully correct  PAIN:  Are you having pain? yes : NPRS scale: 3/10 Pain location: Rt medial knee   Pain description: sore Aggravating factors: SLS Relieving factors: knee flexed toes turned in  PRECAUTIONS:  Fall  WEIGHT BEARING RESTRICTIONS:  No  FALLS:  Has patient fallen in last 6 months? No  LIVING ENVIRONMENT: Lives with: lives alone Lives in: House/apartment Stairs: yes, also has stair lift Has following equipment at home:  walking stick   PLOF:  Independent  PATIENT GOALS:  Decrease pain, day to day stuff   OBJECTIVE:   PATIENT SURVEYS:  FOTO 38  COGNITIVE STATUS: Within functional limits for tasks assessed   RED FLAGS: None    SENSATION: WFL  POSTURE:  Eval: decr lumbar lordosis in standing, slight forward flexion, weight shifted to the Lt  GAIT: EVAL: antalgic gait with walking stick, +Rt trendelenburg   Body Part #1 Hip  PALPATION: EVAL: limited mobility in Rt SIJ; notable anterior rotation of Rt innominate concordant pain in trigger points in Rt gluts, piriformis    FUNCTIONAL TESTS:  Timed up and go (TUG): NT at eval due to pain 3 minute walk test: NT at eval 30s chair stand test   09/06/22: 5x STS= 18.36sec (from hot tub surround)      TREATMENT:  Pt seen for aquatic therapy today.  Treatment took place in water 3.5-4.75 ft in depth at the [facility] pool. Temp of water was 91.   Pt entered/exited the pool via stairs independently in step-to pattern with bilat rail.  * walking unsupported forward/ backward x 5 laps with cues for increased step height of RLE * side step with arm addct/abdct -> then side step into wide min-squat and arm addct with yellow hand floats x 3 laps * farmer carry with bilat yellow hand floats, forward/ backward -> single yellow  forward/ backward  * holding yellow hand floats: heel raises x 10; LE swings into hip flex/ext 2 x 5; Hip abdct/ addct 2 x 5 each LE  * Holding wall: alternating single leg clams x 10 each * UE on yellow hand floats:  hip hinge with forward arm reach x 10 * with UE on rails:  R and Left step up x 10;  * cycling on yellow noodle with breast stroke arms  Pt requires the buoyancy and hydrostatic pressure of water for support, and to offload joints by unweighting joint load by at least 50 % in navel deep water and by at least 75-80% in chest to neck deep water.  Viscosity of the water is needed for resistance of strengthening. Water current perturbations provides challenge to standing balance requiring increased core activation.  * once dried off: reg rock tape applied in x pattern over Rt pes anserine with 20% stretch to decompress tissue and increase proprioception. Pt instructed in safe tape removal technique and verbalized understanding.    PATIENT EDUCATION:  Education details:exercise form/rationale Person educated: Patient Education method: Explanation, Demonstration, Actor cues, Verbal cues,  Education comprehension: verbalized understanding, returned demonstration, verbal cues required, tactile cues required, and needs further education  HOME EXERCISE PROGRAM: [id number]   ASSESSMENT:  CLINICAL IMPRESSION: Pt has met 5x STS goal and STG1.  She reported no change in 3/10 Rt knee pain during session.      OBJECTIVE IMPAIRMENTS: Abnormal gait, decreased activity tolerance, decreased endurance, difficulty  walking, decreased ROM, decreased strength, increased muscle spasms, improper body mechanics, postural dysfunction, and pain.   ACTIVITY LIMITATIONS: carrying, lifting, bending, sitting, squatting, stairs, transfers, bed mobility, bathing, and locomotion level  PARTICIPATION LIMITATIONS: meal prep, cleaning, laundry, shopping, community activity, occupation, yard work, and church  PERSONAL FACTORS: see PMH  REHAB POTENTIAL: Good  CLINICAL DECISION MAKING: Evolving/moderate complexity  EVALUATION COMPLEXITY: Moderate   GOALS: Goals reviewed with patient? Yes  SHORT TERM GOALS: Target date: 7/26  Able to demo proper hip ext in toe off phase of gait Baseline: Goal status: Met - 09/06/22   LONG TERM GOALS: Target date: POC date  Meet FOTO goal Baseline:  Goal status: INITIAL  2.  Find location to continue aquatic HEP Baseline:  Goal status: INITIAL  3.  5TSTS to 20s or less Baseline:  Goal status: Met - 09/06/22  4.  TUG to 13.5s to indicate low fall risk and proper functional ability Baseline:  Goal status: INITIAL    PLAN:  PT FREQUENCY: 1-2x/week  PT DURATION: 8 weeks  PLANNED INTERVENTIONS: Therapeutic exercises, Therapeutic activity, Neuromuscular re-education, Balance training, Gait training, Patient/Family education, Self Care, Joint mobilization, Stair training, Aquatic Therapy, Dry Needling, Electrical stimulation, Spinal mobilization, Cryotherapy, Moist heat, Taping, Traction, Ionotophoresis 4mg /ml Dexamethasone, Manual therapy, and Re-evaluation.  PLAN FOR NEXT SESSION: continue aquatics   Mayer Camel, [place] 09/06/22 12:15 PM [facility] [address]  [place], [place], [address] Phone: [phone]   Fax:  [phone]    Referring diagnosis? R26.2 M15.9 Treatment diagnosis? (if different than referring diagnosis) M25.551 M62.81 R26.89 What was this (referring dx) caused by? [x]   Surgery []  Fall []  Ongoing issue []  Arthritis []  Other: ____________  Laterality: [x]  Rt []  Lt []  Both  Check all possible CPT codes:  *CHOOSE 10 OR LESS*    []  97110 (Therapeutic Exercise)  []  92507 (SLP Treatment)  []  97112 (Neuro Re-ed)   []  92526 (Swallowing Treatment)   []  97116 (Gait Training)   []  [id number] (Cognitive Training, 1st 15 minutes) []  97140 (Manual Therapy)   []  65784 (Cognitive Training, each add'l 15 minutes)  []  97164 (Re-evaluation)                              []  Other, List CPT Code ____________  []  97530 (Therapeutic Activities)     []  97535 (Self Care)   [x]  All codes above (97110 - 97535)  []  97012 (Mechanical Traction)  [x]  97014 (E-stim Unattended)  []  97032 (E-stim manual)  [x]  97033 (Ionto)  [x]  97035 (Ultrasound) [x]  97750 (Physical Performance Training) [x]  [id number] (Aquatic Therapy) []  97016 (Vasopneumatic Device) []  [id number] (Paraffin) []  97034 (Contrast Bath) []  97597 (Wound Care 1st 20 sq cm) []  97598 (Wound Care each add'l 20 sq cm) []  97760 (Orthotic Fabrication, Fitting, Training Initial) []  [id number] (Prosthetic Management and Training Initial) []  [id number] (Orthotic or Prosthetic Training/ Modification Subsequent)

## 2022-09-11 ENCOUNTER — Ambulatory Visit (HOSPITAL_BASED_OUTPATIENT_CLINIC_OR_DEPARTMENT_OTHER): Payer: Medicare PPO | Admitting: Physical Therapy

## 2022-09-11 ENCOUNTER — Encounter (HOSPITAL_BASED_OUTPATIENT_CLINIC_OR_DEPARTMENT_OTHER): Payer: Self-pay | Admitting: Physical Therapy

## 2022-09-11 DIAGNOSIS — M6281 Muscle weakness (generalized): Secondary | ICD-10-CM | POA: Diagnosis not present

## 2022-09-11 DIAGNOSIS — M25551 Pain in right hip: Secondary | ICD-10-CM | POA: Diagnosis not present

## 2022-09-11 DIAGNOSIS — M5459 Other low back pain: Secondary | ICD-10-CM

## 2022-09-11 DIAGNOSIS — R2689 Other abnormalities of gait and mobility: Secondary | ICD-10-CM

## 2022-09-11 NOTE — Therapy (Signed)
OUTPATIENT PHYSICAL THERAPY TREATMENT   Patient Name: Wanda Lang MRN: [id number] DOB:October 05, 1950, 72 y.o., female Today's Date: 09/11/2022  END OF SESSION:  PT End of Session - 09/11/22 1406     Visit Number 5    Date for PT Re-Evaluation 10/03/22    Authorization Type humana MCR    Progress Note Due on Visit 10    PT Start Time 1403    PT Stop Time 1445    PT Time Calculation (min) 42 min    Activity Tolerance Patient tolerated treatment well    Behavior During Therapy WFL for tasks assessed/performed              Past Medical History:  Diagnosis Date   Allergy    Arthritis    Coronary artery disease    Fatty liver    GERD (gastroesophageal reflux disease)    Heart murmur    Hypertension    Hx   LIVER FUNCTION TESTS, ABNORMAL, HX OF 09/08/2007   Qualifier: Diagnosis of  By: Nelson-Smith CMA (AAMA), Dottie     Neuromuscular disorder (HCC)    Essential Tremor   Overactive bladder    Past Surgical History:  Procedure Laterality Date   ABDOMINAL HYSTERECTOMY     COLONOSCOPY WITH PROPOFOL N/A 09/04/2018   Procedure: COLONOSCOPY WITH PROPOFOL;  Surgeon: Pasty Spillers, MD;  Location: [facility];  Service: Endoscopy;  Laterality: N/A;   ESOPHAGOGASTRODUODENOSCOPY (EGD) WITH PROPOFOL N/A 09/04/2018   Procedure: ESOPHAGOGASTRODUODENOSCOPY (EGD) WITH PROPOFOL;  Surgeon: Pasty Spillers, MD;  Location: [facility];  Service: Endoscopy;  Laterality: N/A;   LAPAROSCOPY     for endometriosis x2   TONSILLECTOMY     TOTAL HIP ARTHROPLASTY Right 05/15/2022   Procedure: RIGHT TOTAL HIP ARTHROPLASTY ANTERIOR APPROACH;  Surgeon: Kathryne Hitch, MD;  Location: [facility];  Service: Orthopedics;  Laterality: Right;   TOTAL KNEE ARTHROPLASTY Left 07/18/2021   Procedure: TOTAL KNEE ARTHROPLASTY;  Surgeon: Durene Romans, MD;  Location: [facility];  Service: Orthopedics;  Laterality: Left;   TURBINATE REDUCTION     Patient Active Problem List   Diagnosis Date  Noted   S/P total hip arthroplasty 05/17/2022   Status post total replacement of right hip 05/15/2022   Overactive bladder 04/04/2022   Postmenopausal estrogen deficiency 11/13/2021   Primary osteoarthritis 11/13/2021   Leg length inequality 11/06/2021   Impaired ambulation 10/23/2021   Contact dermatitis 10/16/2021   SI joint arthritis 08/24/2021   Spinal stenosis, lumbar region, without neurogenic claudication 08/24/2021   Degeneration of lumbar intervertebral disc 07/24/2021   S/P total knee arthroplasty, left 07/18/2021   Preop examination 07/07/2021   Right hip pain 07/07/2021   Low back pain 07/07/2021   Drug-induced immunodeficiency (HCC) 06/12/2021   Impingement syndrome of right shoulder region 06/12/2021   COVID-19 01/24/2021   Heart murmur 11/24/2020   Hammer toe 04/22/2020   Metatarsalgia of left foot 04/22/2020   Osteoarthritis of midfoot 04/22/2020   Duodenal ulcer 11/12/2019   Stress 11/12/2019   Morbid obesity (HCC) 11/06/2019   Pain in left knee 07/30/2019   Aortic stenosis, moderate 02/06/2018   Maxillary sinusitis, chronic 10/24/2017   Chronic diarrhea 10/09/2017   Acid reflux 07/15/2017   Asymptomatic carotid artery stenosis 07/15/2017   Cyst of skin 07/15/2017   Immunocompromised (HCC) 11/09/2016   Allergic rhinitis 11/02/2016   Benign essential tremor 11/02/2016   History of hypertension 11/02/2016   Postmenopausal symptoms 07/01/2015   Seasonal allergic rhinitis due to pollen 07/01/2015  Fatty liver 09/08/2007   Rheumatoid arthritis (HCC) 09/08/2007    REFERRING PROVIDER:   Kathryne Hitch, MD    REFERRING DIAG:  R26.2 (ICD-10-CM) - Impaired ambulation  M15.9 (ICD-10-CM) - Primary osteoarthritis involving multiple joints  S/p Rt THA 05/15/22  Rationale for Evaluation and Treatment: Rehabilitation  THERAPY DIAG:  Muscle weakness (generalized)  Other abnormalities of gait and mobility  Other low back pain  ONSET DATE: Rt THA  05/15/22, deconditioned prior to surgery   SUBJECTIVE:                                                                                                                                                                                           SUBJECTIVE STATEMENT: Pt reports using walking stick amb short distance and without AD in home.  She will bring walking stick to next session   PERTINENT HISTORY:  LLD noted prior to surgery & unable to fully correct  PAIN:  Are you having pain? yes : NPRS scale: 3/10 Pain location: Rt medial knee   Pain description: sore Aggravating factors: SLS Relieving factors: knee flexed toes turned in  PRECAUTIONS:  Fall  WEIGHT BEARING RESTRICTIONS:  No  FALLS:  Has patient fallen in last 6 months? No  LIVING ENVIRONMENT: Lives with: lives alone Lives in: House/apartment Stairs: yes, also has stair lift Has following equipment at home:  walking stick   PLOF:  Independent  PATIENT GOALS:  Decrease pain, day to day stuff   OBJECTIVE:   PATIENT SURVEYS:  FOTO 38  COGNITIVE STATUS: Within functional limits for tasks assessed   RED FLAGS: None    SENSATION: WFL  POSTURE:  Eval: decr lumbar lordosis in standing, slight forward flexion, weight shifted to the Lt  GAIT: EVAL: antalgic gait with walking stick, +Rt trendelenburg   Body Part #1 Hip  PALPATION: EVAL: limited mobility in Rt SIJ; notable anterior rotation of Rt innominate concordant pain in trigger points in Rt gluts, piriformis    FUNCTIONAL TESTS:  Timed up and go (TUG): NT at eval due to pain 3 minute walk test: NT at eval 30s chair stand test   09/06/22: 5x STS= 18.36sec (from hot tub surround)      TREATMENT:  Pt seen for aquatic therapy today.  Treatment took place in water 3.5-4.75 ft in depth at the [facility] pool. Temp  of water was 91.  Pt entered/exited the pool via stairs independently in step-to pattern with bilat rail.  * walking unsupported forward/ backward x 5 laps with cues for increased step height of RLE * side step with arm addct/abdct -> then side step into wide min-squat and arm addct with yellow hand floats x 3 laps *alternating forward lunges; alternating backward lunges x 10 ue support yellow HB *curtsy squats x 10 alternating *seated clams 4th step resisted with lt blue aquaband 2 x 10 *Side stepping x 2 widths abd resisted with aqua band *Add set in sitting using orange BB x 10 with 10s hold. *STS from 3rd water step x 8. Indep; from 4th step x 8 indep (increased difficulty) Pt requires the buoyancy and hydrostatic pressure of water for support, and to offload joints by unweighting joint load by at least 50 % in navel deep water and by at least 75-80% in chest to neck deep water.  Viscosity of the water is needed for resistance of strengthening. Water current perturbations provides challenge to standing balance requiring increased core activation.   PATIENT EDUCATION:  Education details:exercise form/rationale Person educated: Patient Education method: Explanation, Demonstration, Actor cues, Verbal cues,  Education comprehension: verbalized understanding, returned demonstration, verbal cues required, tactile cues required, and needs further education  HOME EXERCISE PROGRAM: [email or web address]   ASSESSMENT:  CLINICAL IMPRESSION: Pt reporting 0/10 right hip pain.  Some discomfort in right knee (OA).  Plan to add land based intervention throughout rest of certification as pt demonstrates improvement in strength and balance and is ready for transition to increased loaded exercise. Progressed le strengthening adding lunges (resisted side lunges) and decreased submerged STS which is tolerated without difficulty.  She will bring her walking stick to next session, please instruct/advise. Goals  ongoing     OBJECTIVE IMPAIRMENTS: Abnormal gait, decreased activity tolerance, decreased endurance, difficulty walking, decreased ROM, decreased strength, increased muscle spasms, improper body mechanics, postural dysfunction, and pain.   ACTIVITY LIMITATIONS: carrying, lifting, bending, sitting, squatting, stairs, transfers, bed mobility, bathing, and locomotion level  PARTICIPATION LIMITATIONS: meal prep, cleaning, laundry, shopping, community activity, occupation, yard work, and church  PERSONAL FACTORS: see PMH  REHAB POTENTIAL: Good  CLINICAL DECISION MAKING: Evolving/moderate complexity  EVALUATION COMPLEXITY: Moderate   GOALS: Goals reviewed with patient? Yes  SHORT TERM GOALS: Target date: 7/26  Able to demo proper hip ext in toe off phase of gait Baseline: Goal status: Met - 09/06/22   LONG TERM GOALS: Target date: POC date  Meet FOTO goal Baseline:  Goal status: INITIAL  2.  Find location to continue aquatic HEP Baseline:  Goal status: INITIAL  3.  5TSTS to 20s or less Baseline:  Goal status: Met - 09/06/22  4.  TUG to 13.5s to indicate low fall risk and proper functional ability Baseline:  Goal status: INITIAL    PLAN:  PT FREQUENCY: 1-2x/week  PT DURATION: 8 weeks  PLANNED INTERVENTIONS: Therapeutic exercises, Therapeutic activity, Neuromuscular re-education, Balance training, Gait training, Patient/Family education, Self Care, Joint mobilization, Stair training, Aquatic Therapy, Dry Needling, Electrical stimulation, Spinal mobilization, Cryotherapy, Moist heat, Taping, Traction, Ionotophoresis 4mg /ml Dexamethasone, Manual therapy, and Re-evaluation.  PLAN FOR NEXT SESSION: continue aquatics  Corrie Dandy [place])  MPT 09/11/22 2:08 PM [facility] [address] [place], [place], [address] Phone: [phone]   Fax:  [phone]  Referring diagnosis? R26.2 M15.9 Treatment diagnosis? (if  different than referring diagnosis) M25.551 M62.81 R26.89 What was this (referring dx) caused by? [x]  Surgery []  Fall []  Ongoing issue []  Arthritis []  Other: ____________  Laterality: [x]  Rt []  Lt []  Both  Check all possible CPT codes:  *CHOOSE 10 OR LESS*    []  97110 (Therapeutic Exercise)  []  92507 (SLP Treatment)  []  97112 (Neuro Re-ed)   []  92526 (Swallowing Treatment)   []  97116 (Gait Training)   []  [id number] (Cognitive Training, 1st 15 minutes) []  97140 (Manual Therapy)   []  97130 (Cognitive Training, each add'l 15 minutes)  []  97164 (Re-evaluation)                              []  Other, List CPT Code ____________  []  88416 (Therapeutic Activities)     []  97535 (Self Care)   [x]  All codes above (97110 - 97535)  []  97012 (Mechanical Traction)  [x]  97014 (E-stim Unattended)  []  97032 (E-stim manual)  [x]  97033 (Ionto)  [x]  97035 (Ultrasound) [x]  97750 (Physical Performance Training) [x]  [id number] (Aquatic Therapy) []  97016 (Vasopneumatic Device) []  [id number] (Paraffin) []  97034 (Contrast Bath) []  97597 (Wound Care 1st 20 sq cm) []  97598 (Wound Care each add'l 20 sq cm) []  97760 (Orthotic Fabrication, Fitting, Training Initial) []  [id number] (Prosthetic Management and Training Initial) []  [id number] (Orthotic or Prosthetic Training/ Modification Subsequent)

## 2022-09-14 ENCOUNTER — Ambulatory Visit (HOSPITAL_BASED_OUTPATIENT_CLINIC_OR_DEPARTMENT_OTHER): Payer: Medicare PPO | Admitting: Physical Therapy

## 2022-09-14 ENCOUNTER — Encounter (HOSPITAL_BASED_OUTPATIENT_CLINIC_OR_DEPARTMENT_OTHER): Payer: Self-pay | Admitting: Physical Therapy

## 2022-09-14 ENCOUNTER — Telehealth: Payer: Self-pay | Admitting: Family Medicine

## 2022-09-14 DIAGNOSIS — R2689 Other abnormalities of gait and mobility: Secondary | ICD-10-CM | POA: Diagnosis not present

## 2022-09-14 DIAGNOSIS — N3281 Overactive bladder: Secondary | ICD-10-CM

## 2022-09-14 DIAGNOSIS — M25551 Pain in right hip: Secondary | ICD-10-CM

## 2022-09-14 DIAGNOSIS — M6281 Muscle weakness (generalized): Secondary | ICD-10-CM | POA: Diagnosis not present

## 2022-09-14 DIAGNOSIS — M5459 Other low back pain: Secondary | ICD-10-CM | POA: Diagnosis not present

## 2022-09-14 NOTE — Therapy (Signed)
OUTPATIENT PHYSICAL THERAPY TREATMENT   Patient Name: Wanda Lang MRN: [id number] DOB:1950-11-21, 72 y.o., female Today's Date: 09/14/2022  END OF SESSION:  PT End of Session - 09/14/22 1421     Visit Number 6    Number of Visits 10    Date for PT Re-Evaluation 10/03/22    Authorization Type humana MCR    Progress Note Due on Visit 10    PT Start Time 1417    PT Stop Time 1500    PT Time Calculation (min) 43 min    Activity Tolerance Patient tolerated treatment well    Behavior During Therapy WFL for tasks assessed/performed              Past Medical History:  Diagnosis Date   Allergy    Arthritis    Coronary artery disease    Fatty liver    GERD (gastroesophageal reflux disease)    Heart murmur    Hypertension    Hx   LIVER FUNCTION TESTS, ABNORMAL, HX OF 09/08/2007   Qualifier: Diagnosis of  By: Nelson-Smith CMA (AAMA), Dottie     Neuromuscular disorder (HCC)    Essential Tremor   Overactive bladder    Past Surgical History:  Procedure Laterality Date   ABDOMINAL HYSTERECTOMY     COLONOSCOPY WITH PROPOFOL N/A 09/04/2018   Procedure: COLONOSCOPY WITH PROPOFOL;  Surgeon: Pasty Spillers, MD;  Location: [facility];  Service: Endoscopy;  Laterality: N/A;   ESOPHAGOGASTRODUODENOSCOPY (EGD) WITH PROPOFOL N/A 09/04/2018   Procedure: ESOPHAGOGASTRODUODENOSCOPY (EGD) WITH PROPOFOL;  Surgeon: Pasty Spillers, MD;  Location: [facility];  Service: Endoscopy;  Laterality: N/A;   LAPAROSCOPY     for endometriosis x2   TONSILLECTOMY     TOTAL HIP ARTHROPLASTY Right 05/15/2022   Procedure: RIGHT TOTAL HIP ARTHROPLASTY ANTERIOR APPROACH;  Surgeon: Kathryne Hitch, MD;  Location: [facility];  Service: Orthopedics;  Laterality: Right;   TOTAL KNEE ARTHROPLASTY Left 07/18/2021   Procedure: TOTAL KNEE ARTHROPLASTY;  Surgeon: Durene Romans, MD;  Location: [facility];  Service: Orthopedics;  Laterality: Left;   TURBINATE REDUCTION     Patient Active Problem  List   Diagnosis Date Noted   S/P total hip arthroplasty 05/17/2022   Status post total replacement of right hip 05/15/2022   Overactive bladder 04/04/2022   Postmenopausal estrogen deficiency 11/13/2021   Primary osteoarthritis 11/13/2021   Leg length inequality 11/06/2021   Impaired ambulation 10/23/2021   Contact dermatitis 10/16/2021   SI joint arthritis 08/24/2021   Spinal stenosis, lumbar region, without neurogenic claudication 08/24/2021   Degeneration of lumbar intervertebral disc 07/24/2021   S/P total knee arthroplasty, left 07/18/2021   Preop examination 07/07/2021   Right hip pain 07/07/2021   Low back pain 07/07/2021   Drug-induced immunodeficiency (HCC) 06/12/2021   Impingement syndrome of right shoulder region 06/12/2021   COVID-19 01/24/2021   Heart murmur 11/24/2020   Hammer toe 04/22/2020   Metatarsalgia of left foot 04/22/2020   Osteoarthritis of midfoot 04/22/2020   Duodenal ulcer 11/12/2019   Stress 11/12/2019   Morbid obesity (HCC) 11/06/2019   Pain in left knee 07/30/2019   Aortic stenosis, moderate 02/06/2018   Maxillary sinusitis, chronic 10/24/2017   Chronic diarrhea 10/09/2017   Acid reflux 07/15/2017   Asymptomatic carotid artery stenosis 07/15/2017   Cyst of skin 07/15/2017   Immunocompromised (HCC) 11/09/2016   Allergic rhinitis 11/02/2016   Benign essential tremor 11/02/2016   History of hypertension 11/02/2016   Postmenopausal symptoms 07/01/2015  Seasonal allergic rhinitis due to pollen 07/01/2015   Fatty liver 09/08/2007   Rheumatoid arthritis (HCC) 09/08/2007    REFERRING PROVIDER:   Kathryne Hitch, MD    REFERRING DIAG:  R26.2 (ICD-10-CM) - Impaired ambulation  M15.9 (ICD-10-CM) - Primary osteoarthritis involving multiple joints  S/p Rt THA 05/15/22  Rationale for Evaluation and Treatment: Rehabilitation  THERAPY DIAG:  Muscle weakness (generalized)  Other abnormalities of gait and mobility  Other low back  pain  Pain in right hip  ONSET DATE: Rt THA 05/15/22, deconditioned prior to surgery   SUBJECTIVE:                                                                                                                                                                                           SUBJECTIVE STATEMENT: Pt reports she is doing well  PERTINENT HISTORY:  LLD noted prior to surgery & unable to fully correct  PAIN:  Are you having pain? yes : NPRS scale: 3/10 Pain location: Rt medial knee   Pain description: sore Aggravating factors: SLS Relieving factors: knee flexed toes turned in  PRECAUTIONS:  Fall  WEIGHT BEARING RESTRICTIONS:  No  FALLS:  Has patient fallen in last 6 months? No  LIVING ENVIRONMENT: Lives with: lives alone Lives in: House/apartment Stairs: yes, also has stair lift Has following equipment at home:  walking stick   PLOF:  Independent  PATIENT GOALS:  Decrease pain, day to day stuff   OBJECTIVE:   PATIENT SURVEYS:  FOTO 38 09/14/22:44%  COGNITIVE STATUS: Within functional limits for tasks assessed   RED FLAGS: None    SENSATION: WFL  POSTURE:  Eval: decr lumbar lordosis in standing, slight forward flexion, weight shifted to the Lt  GAIT: EVAL: antalgic gait with walking stick, +Rt trendelenburg   Body Part #1 Hip  PALPATION: EVAL: limited mobility in Rt SIJ; notable anterior rotation of Rt innominate concordant pain in trigger points in Rt gluts, piriformis    FUNCTIONAL TESTS:  Timed up and go (TUG): NT at eval due to pain 3 minute walk test: NT at eval 30s chair stand test   09/06/22: 5x STS= 18.36sec (from hot tub surround)      TREATMENT:                             Foto completed  Pt seen for aquatic therapy today.  Treatment took place in water 3.5-4.75 ft in depth at the [facility] pool. Temp of water  was 91.  Pt entered/exited the pool via stairs independently in step-to pattern with bilat rail.  * walking unsupported forward/ backward x 5 laps with cues for increased step height of RLE * side step with arm addct/abdct -> then side step into wide min-squat and arm addct with yellow hand floats x 3 laps *figure 4 stretch; standing piriformis stretch *alternating forward lunges; alternating backward lunges x 10 ue support yellow HB *seated clams 4th step resisted with lt blue aquaband 2 x 10 *STS 4th step x 10 indep  *Seated 3/4 step: hip abd/add 2x20 reps cues for increased speed for increased resistance; flutter kicking 2 x 20reps  - cycling for recovery *Add set in sitting using orange BB x 10 with 10s hold. *Side lunge x 4 widths abd resisted with aqua band.   Pt requires the buoyancy and hydrostatic pressure of water for support, and to offload joints by unweighting joint load by at least 50 % in navel deep water and by at least 75-80% in chest to neck deep water.  Viscosity of the water is needed for resistance of strengthening. Water current perturbations provides challenge to standing balance requiring increased core activation.   PATIENT EDUCATION:  Education details:exercise form/rationale Person educated: Patient Education method: Explanation, Demonstration, Actor cues, Verbal cues,  Education comprehension: verbalized understanding, returned demonstration, verbal cues required, tactile cues required, and needs further education  HOME EXERCISE PROGRAM: [email or web address]   ASSESSMENT:  CLINICAL IMPRESSION: Very good toleration to progression of hip strengthening.  Continues to have left adductor tightness with some discomfort she reports in full hiup flex reaching to put socks on.  Added hip ER, piriformis and adductor stretching.  Discussed donning shoes and socks with leg crossed over other.  Will work on stretching to allow for movement. Foto 44% exceeding  goal.       OBJECTIVE IMPAIRMENTS: Abnormal gait, decreased activity tolerance, decreased endurance, difficulty walking, decreased ROM, decreased strength, increased muscle spasms, improper body mechanics, postural dysfunction, and pain.   ACTIVITY LIMITATIONS: carrying, lifting, bending, sitting, squatting, stairs, transfers, bed mobility, bathing, and locomotion level  PARTICIPATION LIMITATIONS: meal prep, cleaning, laundry, shopping, community activity, occupation, yard work, and church  PERSONAL FACTORS: see PMH  REHAB POTENTIAL: Good  CLINICAL DECISION MAKING: Evolving/moderate complexity  EVALUATION COMPLEXITY: Moderate   GOALS: Goals reviewed with patient? Yes  SHORT TERM GOALS: Target date: 7/26  Able to demo proper hip ext in toe off phase of gait Baseline: Goal status: Met - 09/06/22   LONG TERM GOALS: Target date: POC date  Meet FOTO goal Baseline:  Goal status: Met 09/14/22  2.  Find location to continue aquatic HEP Baseline:  Goal status: INITIAL  3.  5TSTS to 20s or less Baseline:  Goal status: Met - 09/06/22  4.  TUG to 13.5s to indicate low fall risk and proper functional ability Baseline:  Goal status: INITIAL    PLAN:  PT FREQUENCY: 1-2x/week  PT DURATION: 8 weeks  PLANNED INTERVENTIONS: Therapeutic exercises, Therapeutic activity, Neuromuscular re-education, Balance training, Gait training, Patient/Family education, Self Care, Joint mobilization, Stair training, Aquatic Therapy, Dry Needling, Electrical stimulation, Spinal mobilization, Cryotherapy, Moist heat, Taping, Traction, Ionotophoresis 4mg /ml Dexamethasone, Manual therapy, and Re-evaluation.  PLAN FOR NEXT SESSION: continue aquatics  Corrie Dandy Calhoun)  MPT 09/14/22 5:29 PM [facility] [address]  [facility] [address]  [place], [place], [address] Phone: [phone]   Fax:  [phone]    Referring diagnosis? R26.2 M15.9 Treatment diagnosis?  (if different than referring diagnosis) M25.551 M62.81 R26.89 What was this (referring dx) caused by? [x]  Surgery []  Fall []  Ongoing issue []  Arthritis []  Other: ____________  Laterality: [x]  Rt []  Lt []  Both  Check all possible CPT codes:  *CHOOSE 10 OR LESS*    []  97110 (Therapeutic Exercise)  []  92507 (SLP Treatment)  []  97112 (Neuro Re-ed)   []  92526 (Swallowing Treatment)   []  97116 (Gait Training)   []  [id number] (Cognitive Training, 1st 15 minutes) []  01601 (Manual Therapy)   []  97130 (Cognitive Training, each add'l 15 minutes)  []  97164 (Re-evaluation)                              []  Other, List CPT Code ____________  []  97530 (Therapeutic Activities)     []  97535 (Self Care)   [x]  All codes above (97110 - 97535)  []  97012 (Mechanical Traction)  [x]  97014 (E-stim Unattended)  []  97032 (E-stim manual)  [x]  97033 (Ionto)  [x]  97035 (Ultrasound) [x]  97750 (Physical Performance Training) [x]  [id number] (Aquatic Therapy) []  97016 (Vasopneumatic Device) []  [id number] (Paraffin) []  97034 (Contrast Bath) []  97597 (Wound Care 1st 20 sq cm) []  97598 (Wound Care each add'l 20 sq cm) []  97760 (Orthotic Fabrication, Fitting, Training Initial) []  [id number] (Prosthetic Management and Training Initial) []  [id number] (Orthotic or Prosthetic Training/ Modification Subsequent)

## 2022-09-14 NOTE — Telephone Encounter (Signed)
Pt called in stating as per her last conversation with Dr. Birdie Sons, she would like for him to go ahead and make the Urology referral for her.

## 2022-09-14 NOTE — Telephone Encounter (Signed)
Referral placed.

## 2022-09-18 ENCOUNTER — Encounter (HOSPITAL_BASED_OUTPATIENT_CLINIC_OR_DEPARTMENT_OTHER): Payer: Self-pay | Admitting: Physical Therapy

## 2022-09-18 ENCOUNTER — Ambulatory Visit (HOSPITAL_BASED_OUTPATIENT_CLINIC_OR_DEPARTMENT_OTHER): Payer: Medicare PPO | Admitting: Physical Therapy

## 2022-09-18 DIAGNOSIS — R2689 Other abnormalities of gait and mobility: Secondary | ICD-10-CM

## 2022-09-18 DIAGNOSIS — M6281 Muscle weakness (generalized): Secondary | ICD-10-CM

## 2022-09-18 DIAGNOSIS — M5459 Other low back pain: Secondary | ICD-10-CM | POA: Diagnosis not present

## 2022-09-18 DIAGNOSIS — M25551 Pain in right hip: Secondary | ICD-10-CM

## 2022-09-18 NOTE — Therapy (Signed)
OUTPATIENT PHYSICAL THERAPY TREATMENT   Patient Name: Wanda Lang MRN: [id number] DOB:[birth year], 72 y.o., female Today's Date: 09/18/2022  END OF SESSION:  PT End of Session - 09/18/22 0809     Visit Number 7    Number of Visits 10    Date for PT Re-Evaluation 10/03/22    Authorization Type humana MCR    Progress Note Due on Visit 10    PT Start Time 0801    PT Stop Time 0841    PT Time Calculation (min) 40 min    Activity Tolerance Patient tolerated treatment well    Behavior During Therapy WFL for tasks assessed/performed               Past Medical History:  Diagnosis Date   Allergy    Arthritis    Coronary artery disease    Fatty liver    GERD (gastroesophageal reflux disease)    Heart murmur    Hypertension    Hx   LIVER FUNCTION TESTS, ABNORMAL, HX OF 09/08/2007   Qualifier: Diagnosis of  By: Nelson-Smith CMA (AAMA), Dottie     Neuromuscular disorder (HCC)    Essential Tremor   Overactive bladder    Past Surgical History:  Procedure Laterality Date   ABDOMINAL HYSTERECTOMY     COLONOSCOPY WITH PROPOFOL N/A 09/04/2018   Procedure: COLONOSCOPY WITH PROPOFOL;  Surgeon: Pasty Spillers, MD;  Location: [facility];  Service: Endoscopy;  Laterality: N/A;   ESOPHAGOGASTRODUODENOSCOPY (EGD) WITH PROPOFOL N/A 09/04/2018   Procedure: ESOPHAGOGASTRODUODENOSCOPY (EGD) WITH PROPOFOL;  Surgeon: Pasty Spillers, MD;  Location: [facility];  Service: Endoscopy;  Laterality: N/A;   LAPAROSCOPY     for endometriosis x2   TONSILLECTOMY     TOTAL HIP ARTHROPLASTY Right 05/15/2022   Procedure: RIGHT TOTAL HIP ARTHROPLASTY ANTERIOR APPROACH;  Surgeon: Kathryne Hitch, MD;  Location: [facility];  Service: Orthopedics;  Laterality: Right;   TOTAL KNEE ARTHROPLASTY Left 07/18/2021   Procedure: TOTAL KNEE ARTHROPLASTY;  Surgeon: Durene Romans, MD;  Location: [facility];  Service: Orthopedics;  Laterality: Left;   TURBINATE REDUCTION     Patient Active  Problem List   Diagnosis Date Noted   S/P total hip arthroplasty 05/17/2022   Status post total replacement of right hip 05/15/2022   Overactive bladder 04/04/2022   Postmenopausal estrogen deficiency 11/13/2021   Primary osteoarthritis 11/13/2021   Leg length inequality 11/06/2021   Impaired ambulation 10/23/2021   Contact dermatitis 10/16/2021   SI joint arthritis 08/24/2021   Spinal stenosis, lumbar region, without neurogenic claudication 08/24/2021   Degeneration of lumbar intervertebral disc 07/24/2021   S/P total knee arthroplasty, left 07/18/2021   Preop examination 07/07/2021   Right hip pain 07/07/2021   Low back pain 07/07/2021   Drug-induced immunodeficiency (HCC) 06/12/2021   Impingement syndrome of right shoulder region 06/12/2021   COVID-19 01/24/2021   Heart murmur 11/24/2020   Hammer toe 04/22/2020   Metatarsalgia of left foot 04/22/2020   Osteoarthritis of midfoot 04/22/2020   Duodenal ulcer 11/12/2019   Stress 11/12/2019   Morbid obesity (HCC) 11/06/2019   Pain in left knee 07/30/2019   Aortic stenosis, moderate 02/06/2018   Maxillary sinusitis, chronic 10/24/2017   Chronic diarrhea 10/09/2017   Acid reflux 07/15/2017   Asymptomatic carotid artery stenosis 07/15/2017   Cyst of skin 07/15/2017   Immunocompromised (HCC) 11/09/2016   Allergic rhinitis 11/02/2016   Benign essential tremor 11/02/2016   History of hypertension 11/02/2016   Postmenopausal symptoms 07/01/2015  Seasonal allergic rhinitis due to pollen 07/01/2015   Fatty liver 09/08/2007   Rheumatoid arthritis (HCC) 09/08/2007    REFERRING PROVIDER:   Kathryne Hitch, MD    REFERRING DIAG:  R26.2 (ICD-10-CM) - Impaired ambulation  M15.9 (ICD-10-CM) - Primary osteoarthritis involving multiple joints  S/p Rt THA 05/15/22  Rationale for Evaluation and Treatment: Rehabilitation  THERAPY DIAG:  Muscle weakness (generalized)  Other abnormalities of gait and mobility  Other low  back pain  Pain in right hip  ONSET DATE: Rt THA 05/15/22, deconditioned prior to surgery   SUBJECTIVE:                                                                                                                                                                                           SUBJECTIVE STATEMENT: Pt reports she is doing well and wants to work on hip strength and flexibility.   PERTINENT HISTORY:  LLD noted prior to surgery & unable to fully correct  PAIN:  Are you having pain? yes : NPRS scale: 2/10 Pain location: R hip, knee   Pain description: sore Aggravating factors: SLS Relieving factors: knee flexed toes turned in  PRECAUTIONS:  Fall  WEIGHT BEARING RESTRICTIONS:  No  FALLS:  Has patient fallen in last 6 months? No  LIVING ENVIRONMENT: Lives with: lives alone Lives in: House/apartment Stairs: yes, also has stair lift Has following equipment at home:  walking stick   PLOF:  Independent  PATIENT GOALS:  Decrease pain, day to day stuff   OBJECTIVE:   PATIENT SURVEYS:  FOTO 38 09/14/22:44%  COGNITIVE STATUS: Within functional limits for tasks assessed   RED FLAGS: None    SENSATION: WFL  POSTURE:  Eval: decr lumbar lordosis in standing, slight forward flexion, weight shifted to the Lt  GAIT: EVAL: antalgic gait with walking stick, +Rt trendelenburg   Body Part #1 Hip  PALPATION: EVAL: limited mobility in Rt SIJ; notable anterior rotation of Rt innominate concordant pain in trigger points in Rt gluts, piriformis    FUNCTIONAL TESTS:  Timed up and go (TUG): NT at eval due to pain 3 minute walk test: NT at eval 30s chair stand test   09/06/22: 5x STS= 18.36sec (from hot tub surround)      TREATMENT:        8/11  Nustep warm up 5 min, Lvl 3  Prone quad stretch with strap 30s 4x  Supine piriformis stretch 30s 4x Bridge with GTB at knees 3x8 Standing hip flexor stretch 30s 4x  8" box step up with UE 3x8   Previous:  Foto completed                                                                                                    Pt seen for aquatic therapy today.  Treatment took place in water 3.5-4.75 ft in depth at the [facility] pool. Temp of water was 91.  Pt entered/exited the pool via stairs independently in step-to pattern with bilat rail.  * walking unsupported forward/ backward x 5 laps with cues for increased step height of RLE * side step with arm addct/abdct -> then side step into wide min-squat and arm addct with yellow hand floats x 3 laps *figure 4 stretch; standing piriformis stretch *alternating forward lunges; alternating backward lunges x 10 ue support yellow HB *seated clams 4th step resisted with lt blue aquaband 2 x 10 *STS 4th step x 10 indep  *Seated 3/4 step: hip abd/add 2x20 reps cues for increased speed for increased resistance; flutter kicking 2 x 20reps  - cycling for recovery *Add set in sitting using orange BB x 10 with 10s hold. *Side lunge x 4 widths abd resisted with aqua band.   Pt requires the buoyancy and hydrostatic pressure of water for support, and to offload joints by unweighting joint load by at least 50 % in navel deep water and by at least 75-80% in chest to neck deep water.  Viscosity of the water is needed for resistance of strengthening. Water current perturbations provides challenge to standing balance requiring increased core activation.   PATIENT EDUCATION:  Education details:exercise form/rationale Person educated: Patient Education method: Explanation, Demonstration, Actor cues, Verbal cues,  Education comprehension: verbalized understanding, returned demonstration, verbal cues required, tactile cues required, and needs further education  HOME EXERCISE PROGRAM: [email or web address]   ASSESSMENT:  CLINICAL IMPRESSION: Pt land based HEP progressed today to include anterior hip mobility as well as rotation flexibility for  LE dressing. Pt without pain during session but does note continued anterior hip stiffness. Pt advised on land based HEP compliance between therapy visits in order to improve SL flexibility and strength. Plan to continue with progressive hip stretching and R hip/LE strength for improvement in mobility and balance. Consider addition of lumbar mobility and stretching for the R at future sessions as well. Pt would benefit from continued skilled therapy in order to reach goals and maximize functional R LE strength and ROM for prevention of further functional decline.   OBJECTIVE IMPAIRMENTS: Abnormal gait, decreased activity tolerance, decreased endurance, difficulty walking, decreased ROM, decreased strength, increased muscle spasms, improper body mechanics, postural dysfunction, and pain.   ACTIVITY LIMITATIONS: carrying, lifting, bending, sitting, squatting, stairs, transfers, bed mobility, bathing, and locomotion level  PARTICIPATION LIMITATIONS: meal prep, cleaning, laundry, shopping, community activity, occupation, yard work, and church  PERSONAL FACTORS: see PMH  REHAB POTENTIAL: Good  CLINICAL DECISION MAKING: Evolving/moderate complexity  EVALUATION COMPLEXITY: Moderate   GOALS: Goals reviewed with patient? Yes  SHORT TERM GOALS: Target date: 7/26  Able to demo proper hip ext in toe off phase of gait Baseline: Goal status: Met - 09/06/22   LONG TERM GOALS:  Target date: POC date  Meet FOTO goal Baseline:  Goal status: Met 09/14/22  2.  Find location to continue aquatic HEP Baseline:  Goal status: INITIAL  3.  5TSTS to 20s or less Baseline:  Goal status: Met - 09/06/22  4.  TUG to 13.5s to indicate low fall risk and proper functional ability Baseline:  Goal status: INITIAL    PLAN:  PT FREQUENCY: 1-2x/week  PT DURATION: 8 weeks  PLANNED INTERVENTIONS: Therapeutic exercises, Therapeutic activity, Neuromuscular re-education, Balance training, Gait training,  Patient/Family education, Self Care, Joint mobilization, Stair training, Aquatic Therapy, Dry Needling, Electrical stimulation, Spinal mobilization, Cryotherapy, Moist heat, Taping, Traction, Ionotophoresis 4mg /ml Dexamethasone, Manual therapy, and Re-evaluation.  PLAN FOR NEXT SESSION: continue aquatics; land based strength and stretching    Zebedee Iba PT, DPT 09/18/22 8:42 AM     Referring diagnosis? R26.2 M15.9 Treatment diagnosis? (if different than referring diagnosis) M25.551 M62.81 R26.89 What was this (referring dx) caused by? [x]  Surgery []  Fall []  Ongoing issue []  Arthritis []  Other: ____________  Laterality: [x]  Rt []  Lt []  Both  Check all possible CPT codes:  *CHOOSE 10 OR LESS*    []  97110 (Therapeutic Exercise)  []  92507 (SLP Treatment)  []  97112 (Neuro Re-ed)   []  92526 (Swallowing Treatment)   []  97116 (Gait Training)   []  [id number] (Cognitive Training, 1st 15 minutes) []  97140 (Manual Therapy)   []  97130 (Cognitive Training, each add'l 15 minutes)  []  97164 (Re-evaluation)                              []  Other, List CPT Code ____________  []  97530 (Therapeutic Activities)     []  97535 (Self Care)   [x]  All codes above (97110 - 97535)  []  97012 (Mechanical Traction)  [x]  97014 (E-stim Unattended)  []  97032 (E-stim manual)  [x]  97033 (Ionto)  [x]  97035 (Ultrasound) [x]  82956 (Physical Performance Training) [x]  [id number] (Aquatic Therapy) []  97016 (Vasopneumatic Device) []  [id number] (Paraffin) []  97034 (Contrast Bath) []  97597 (Wound Care 1st 20 sq cm) []  97598 (Wound Care each add'l 20 sq cm) []  97760 (Orthotic Fabrication, Fitting, Training Initial) []  [id number] (Prosthetic Management and Training Initial) []  [id number] (Orthotic or Prosthetic Training/ Modification Subsequent)

## 2022-09-20 ENCOUNTER — Ambulatory Visit (HOSPITAL_BASED_OUTPATIENT_CLINIC_OR_DEPARTMENT_OTHER): Payer: Medicare PPO | Admitting: Physical Therapy

## 2022-09-20 ENCOUNTER — Encounter (HOSPITAL_BASED_OUTPATIENT_CLINIC_OR_DEPARTMENT_OTHER): Payer: Self-pay | Admitting: Physical Therapy

## 2022-09-20 DIAGNOSIS — M5459 Other low back pain: Secondary | ICD-10-CM | POA: Diagnosis not present

## 2022-09-20 DIAGNOSIS — M6281 Muscle weakness (generalized): Secondary | ICD-10-CM | POA: Diagnosis not present

## 2022-09-20 DIAGNOSIS — M25551 Pain in right hip: Secondary | ICD-10-CM | POA: Diagnosis not present

## 2022-09-20 DIAGNOSIS — R2689 Other abnormalities of gait and mobility: Secondary | ICD-10-CM

## 2022-09-20 NOTE — Therapy (Signed)
OUTPATIENT PHYSICAL THERAPY TREATMENT   Patient Name: Wanda Lang MRN: [id number] DOB:07/10/1950, 72 y.o., female Today's Date: 09/20/2022  END OF SESSION:  PT End of Session - 09/20/22 1418     Visit Number 8    Number of Visits 10    Date for PT Re-Evaluation 10/03/22    Authorization Type humana MCR    Progress Note Due on Visit 10    PT Start Time 1402    PT Stop Time 1444    PT Time Calculation (min) 42 min    Activity Tolerance Patient tolerated treatment well    Behavior During Therapy WFL for tasks assessed/performed               Past Medical History:  Diagnosis Date   Allergy    Arthritis    Coronary artery disease    Fatty liver    GERD (gastroesophageal reflux disease)    Heart murmur    Hypertension    Hx   LIVER FUNCTION TESTS, ABNORMAL, HX OF 09/08/2007   Qualifier: Diagnosis of  By: Nelson-Smith CMA (AAMA), Dottie     Neuromuscular disorder (HCC)    Essential Tremor   Overactive bladder    Past Surgical History:  Procedure Laterality Date   ABDOMINAL HYSTERECTOMY     COLONOSCOPY WITH PROPOFOL N/A 09/04/2018   Procedure: COLONOSCOPY WITH PROPOFOL;  Surgeon: Pasty Spillers, MD;  Location: [facility];  Service: Endoscopy;  Laterality: N/A;   ESOPHAGOGASTRODUODENOSCOPY (EGD) WITH PROPOFOL N/A 09/04/2018   Procedure: ESOPHAGOGASTRODUODENOSCOPY (EGD) WITH PROPOFOL;  Surgeon: Pasty Spillers, MD;  Location: [facility];  Service: Endoscopy;  Laterality: N/A;   LAPAROSCOPY     for endometriosis x2   TONSILLECTOMY     TOTAL HIP ARTHROPLASTY Right 05/15/2022   Procedure: RIGHT TOTAL HIP ARTHROPLASTY ANTERIOR APPROACH;  Surgeon: Kathryne Hitch, MD;  Location: [facility];  Service: Orthopedics;  Laterality: Right;   TOTAL KNEE ARTHROPLASTY Left 07/18/2021   Procedure: TOTAL KNEE ARTHROPLASTY;  Surgeon: Durene Romans, MD;  Location: [facility];  Service: Orthopedics;  Laterality: Left;   TURBINATE REDUCTION     Patient Active  Problem List   Diagnosis Date Noted   S/P total hip arthroplasty 05/17/2022   Status post total replacement of right hip 05/15/2022   Overactive bladder 04/04/2022   Postmenopausal estrogen deficiency 11/13/2021   Primary osteoarthritis 11/13/2021   Leg length inequality 11/06/2021   Impaired ambulation 10/23/2021   Contact dermatitis 10/16/2021   SI joint arthritis 08/24/2021   Spinal stenosis, lumbar region, without neurogenic claudication 08/24/2021   Degeneration of lumbar intervertebral disc 07/24/2021   S/P total knee arthroplasty, left 07/18/2021   Preop examination 07/07/2021   Right hip pain 07/07/2021   Low back pain 07/07/2021   Drug-induced immunodeficiency (HCC) 06/12/2021   Impingement syndrome of right shoulder region 06/12/2021   COVID-19 01/24/2021   Heart murmur 11/24/2020   Hammer toe 04/22/2020   Metatarsalgia of left foot 04/22/2020   Osteoarthritis of midfoot 04/22/2020   Duodenal ulcer 11/12/2019   Stress 11/12/2019   Morbid obesity (HCC) 11/06/2019   Pain in left knee 07/30/2019   Aortic stenosis, moderate 02/06/2018   Maxillary sinusitis, chronic 10/24/2017   Chronic diarrhea 10/09/2017   Acid reflux 07/15/2017   Asymptomatic carotid artery stenosis 07/15/2017   Cyst of skin 07/15/2017   Immunocompromised (HCC) 11/09/2016   Allergic rhinitis 11/02/2016   Benign essential tremor 11/02/2016   History of hypertension 11/02/2016   Postmenopausal symptoms 07/01/2015  Seasonal allergic rhinitis due to pollen 07/01/2015   Fatty liver 09/08/2007   Rheumatoid arthritis (HCC) 09/08/2007    REFERRING PROVIDER:   Kathryne Hitch, MD    REFERRING DIAG:  R26.2 (ICD-10-CM) - Impaired ambulation  M15.9 (ICD-10-CM) - Primary osteoarthritis involving multiple joints  S/p Rt THA 05/15/22  Rationale for Evaluation and Treatment: Rehabilitation  THERAPY DIAG:  Muscle weakness (generalized)  Other abnormalities of gait and mobility  Other low  back pain  Pain in right hip  ONSET DATE: Rt THA 05/15/22, deconditioned prior to surgery   SUBJECTIVE:                                                                                                                                                                                           SUBJECTIVE STATEMENT: Pt reports she was sore and achy after land session, "like after a workout soreness".  Points to anterior hips.  PERTINENT HISTORY:  LLD noted prior to surgery & unable to fully correct  PAIN:  Are you having pain? yes : NPRS scale: 2/10 Pain location: R hip, knee   Pain description: sore Aggravating factors: SLS Relieving factors: knee flexed toes turned in  PRECAUTIONS:  Fall  WEIGHT BEARING RESTRICTIONS:  No  FALLS:  Has patient fallen in last 6 months? No  LIVING ENVIRONMENT: Lives with: lives alone Lives in: House/apartment Stairs: yes, also has stair lift Has following equipment at home:  walking stick   PLOF:  Independent  PATIENT GOALS:  Decrease pain, day to day stuff   OBJECTIVE:   PATIENT SURVEYS:  FOTO 38 09/14/22:44%  COGNITIVE STATUS: Within functional limits for tasks assessed   RED FLAGS: None    SENSATION: WFL  POSTURE:  Eval: decr lumbar lordosis in standing, slight forward flexion, weight shifted to the Lt  GAIT: EVAL: antalgic gait with walking stick, +Rt trendelenburg   Body Part #1 Hip  PALPATION: EVAL: limited mobility in Rt SIJ; notable anterior rotation of Rt innominate concordant pain in trigger points in Rt gluts, piriformis    FUNCTIONAL TESTS:  Timed up and go (TUG): NT at eval due to pain 3 minute walk test: NT at eval 30s chair stand test   09/06/22: 5x STS= 18.36sec (from hot tub surround)      TREATMENT:       09/20/22 Pt seen for aquatic therapy today.  Treatment took place in water 3.5-4.75 ft in depth at the [facility] pool. Temp of water was 91.  Pt entered/exited the pool via stairs  independently in step-to pattern with bilat rail.  * walking unsupported forward/ backward x 5 laps  with cues for increased step height of RLE * tandem gait forward/ backward, hands out of water 2 laps  * monster walk forward/ backward * farmer carry with solid noodle held at side, with forward/backward marching  * TrA set with solid noodle pull down to thighs, isometric holds on return to surface x 10 * UE on solid noodle:  leg swings into hip flex/ ext x 10; single leg heel raises x 10 each;  * SLS with noodle stomp (solid noodle), no UE support x 10 slow *STS 3rd step x 5, 4th step x 5, slow eccentric lowering to step   8/11  Nustep warm up 5 min, Lvl 3  Prone quad stretch with strap 30s 4x  Supine piriformis stretch 30s 4x Bridge with GTB at knees 3x8 Standing hip flexor stretch 30s 4x  8" box step up with UE 3x8   PATIENT EDUCATION:  Education details:exercise form/rationale Person educated: Patient Education method: Explanation, Demonstration, Tactile cues, Verbal cues,  Education comprehension: verbalized understanding, returned demonstration, verbal cues required, tactile cues required, and needs further education  HOME EXERCISE PROGRAM: [id number]   ASSESSMENT:  CLINICAL IMPRESSION: Pt tolerated all aquatic exercises well, without increase in pain. Pt considering joining [employer] for use of pool. PT to assess goals for progress note / recert(?) next visit.   Plan to continue with progressive hip stretching and R hip/LE strength for improvement in mobility and balance. Per previous land session: Consider addition of lumbar mobility and stretching for the R at future sessions as well. Pt would benefit from continued skilled therapy in order to reach goals and maximize functional R LE strength and ROM for prevention of further functional decline.   OBJECTIVE IMPAIRMENTS: Abnormal gait, decreased activity tolerance, decreased endurance, difficulty walking, decreased ROM,  decreased strength, increased muscle spasms, improper body mechanics, postural dysfunction, and pain.   ACTIVITY LIMITATIONS: carrying, lifting, bending, sitting, squatting, stairs, transfers, bed mobility, bathing, and locomotion level  PARTICIPATION LIMITATIONS: meal prep, cleaning, laundry, shopping, community activity, occupation, yard work, and church  PERSONAL FACTORS: see PMH  REHAB POTENTIAL: Good  CLINICAL DECISION MAKING: Evolving/moderate complexity  EVALUATION COMPLEXITY: Moderate   GOALS: Goals reviewed with patient? Yes  SHORT TERM GOALS: Target date: 7/26  Able to demo proper hip ext in toe off phase of gait Baseline: Goal status: Met - 09/06/22   LONG TERM GOALS: Target date: POC date  Meet FOTO goal Baseline:  Goal status: Met 09/14/22  2.  Find location to continue aquatic HEP Baseline:  Goal status: INITIAL  3.  5TSTS to 20s or less Baseline:  Goal status: Met - 09/06/22  4.  TUG to 13.5s to indicate low fall risk and proper functional ability Baseline:  Goal status: INITIAL    PLAN:  PT FREQUENCY: 1-2x/week  PT DURATION: 8 weeks  PLANNED INTERVENTIONS: Therapeutic exercises, Therapeutic activity, Neuromuscular re-education, Balance training, Gait training, Patient/Family education, Self Care, Joint mobilization, Stair training, Aquatic Therapy, Dry Needling, Electrical stimulation, Spinal mobilization, Cryotherapy, Moist heat, Taping, Traction, Ionotophoresis 4mg /ml Dexamethasone, Manual therapy, and Re-evaluation.  PLAN FOR NEXT SESSION: continue aquatics; land based strength and stretching   Mayer Camel, PTA 09/20/22 2:46 PM [facility] [address] [place], [place], [address] Phone: [phone]   Fax:  [phone]      Referring diagnosis? R26.2 M15.9 Treatment diagnosis? (if different than referring diagnosis) M25.551 M62.81 R26.89 What was this (referring dx) caused  by? [x]  Surgery []  Fall []  Ongoing issue []   Arthritis []  Other: ____________  Laterality: [x]  Rt []  Lt []  Both  Check all possible CPT codes:  *CHOOSE 10 OR LESS*    []  97110 (Therapeutic Exercise)  []  16109 (SLP Treatment)  []  97112 (Neuro Re-ed)   []  92526 (Swallowing Treatment)   []  97116 (Gait Training)   []  [id number] (Cognitive Training, 1st 15 minutes) []  97140 (Manual Therapy)   []  97130 (Cognitive Training, each add'l 15 minutes)  []  97164 (Re-evaluation)                              []  Other, List CPT Code ____________  []  97530 (Therapeutic Activities)     []  97535 (Self Care)   [x]  All codes above (97110 - 97535)  []  97012 (Mechanical Traction)  [x]  97014 (E-stim Unattended)  []  97032 (E-stim manual)  [x]  97033 (Ionto)  [x]  97035 (Ultrasound) [x]  97750 (Physical Performance Training) [x]  [id number] (Aquatic Therapy) []  97016 (Vasopneumatic Device) []  [id number] (Paraffin) []  97034 (Contrast Bath) []  97597 (Wound Care 1st 20 sq cm) []  97598 (Wound Care each add'l 20 sq cm) []  97760 (Orthotic Fabrication, Fitting, Training Initial) []  [id number] (Prosthetic Management and Training Initial) []  [id number] (Orthotic or Prosthetic Training/ Modification Subsequent)

## 2022-09-21 DIAGNOSIS — M0589 Other rheumatoid arthritis with rheumatoid factor of multiple sites: Secondary | ICD-10-CM | POA: Diagnosis not present

## 2022-09-25 ENCOUNTER — Ambulatory Visit (HOSPITAL_BASED_OUTPATIENT_CLINIC_OR_DEPARTMENT_OTHER): Payer: Medicare PPO | Admitting: Physical Therapy

## 2022-09-25 ENCOUNTER — Encounter (HOSPITAL_BASED_OUTPATIENT_CLINIC_OR_DEPARTMENT_OTHER): Payer: Self-pay | Admitting: Physical Therapy

## 2022-09-25 ENCOUNTER — Ambulatory Visit: Payer: Medicare PPO | Admitting: Urology

## 2022-09-25 VITALS — BP 144/82 | HR 92 | Ht 64.5 in | Wt 207.1 lb

## 2022-09-25 DIAGNOSIS — M6281 Muscle weakness (generalized): Secondary | ICD-10-CM | POA: Diagnosis not present

## 2022-09-25 DIAGNOSIS — N393 Stress incontinence (female) (male): Secondary | ICD-10-CM | POA: Diagnosis not present

## 2022-09-25 DIAGNOSIS — R2689 Other abnormalities of gait and mobility: Secondary | ICD-10-CM | POA: Diagnosis not present

## 2022-09-25 DIAGNOSIS — N3281 Overactive bladder: Secondary | ICD-10-CM | POA: Diagnosis not present

## 2022-09-25 DIAGNOSIS — M25551 Pain in right hip: Secondary | ICD-10-CM | POA: Diagnosis not present

## 2022-09-25 DIAGNOSIS — R351 Nocturia: Secondary | ICD-10-CM

## 2022-09-25 DIAGNOSIS — M5459 Other low back pain: Secondary | ICD-10-CM | POA: Diagnosis not present

## 2022-09-25 LAB — MICROSCOPIC EXAMINATION

## 2022-09-25 LAB — URINALYSIS, COMPLETE
Bilirubin, UA: NEGATIVE
Glucose, UA: NEGATIVE
Ketones, UA: NEGATIVE
Leukocytes,UA: NEGATIVE
Nitrite, UA: NEGATIVE
Protein,UA: NEGATIVE
RBC, UA: NEGATIVE
Specific Gravity, UA: 1.025 (ref 1.005–1.030)
Urobilinogen, Ur: 0.2 mg/dL (ref 0.2–1.0)
pH, UA: 5.5 (ref 5.0–7.5)

## 2022-09-25 LAB — BLADDER SCAN AMB NON-IMAGING: Scan Result: 14

## 2022-09-25 MED ORDER — OXYBUTYNIN CHLORIDE ER 15 MG PO TB24
15.0000 mg | ORAL_TABLET | Freq: Every day | ORAL | 5 refills | Status: DC
Start: 2022-09-25 — End: 2023-02-22

## 2022-09-25 NOTE — Progress Notes (Signed)
I,Amy L Pierron,acting as a scribe for Wanda Scotland, MD.,have documented all relevant documentation on the behalf of Wanda Scotland, MD,as directed by  Wanda Scotland, MD while in the presence of Wanda Scotland, MD.  09/25/2022 1:00 PM   Wanda Lang 12-03-1950 [id number]  Referring provider: Glori Luis, MD [address] [place],  [place] [address]  Chief Complaint  Patient presents with   Establish Care   Over Active Bladder    HPI: 72 year-old female presents today for further evaluation of overactive bladder.  Notably, she's been treated previously by her primary care, Dr. Birdie Sons. She's tried Curator with little benefit. When she saw Dr. Birdie Sons in April, she'd only been on Gemtesa for about one to two weeks. Her urinalysis checked in April was negative.   Contributing factors include obesity, age, and status post abdominal hysterectomy.  Her urinalysis today is negative. Looks like she urinates eight or more times during the day, three or more at night time. When she does the flow is strong. She also leaks with laughing, coughing, and sneezing. When she feels the urge to urinate, she can't get to the bathroom on time. She has leakage at least two to three times weekly. She always wears an absorbant pad. She wears two, but was previously wearing three to four.   She mentions her urinary symptoms started around the time of her knee replacement in June 2023. However, she may have started having issues sooner but due to her increased lack of mobility she noticed them more at that point. She started wearing protection after her knee surgery because she was slower to make it to the bathroom. She was on Gemtesa for a month but her symptoms didn't improve.   Denies burning, hematuria, diarrhea, constipation, and vaginal symptoms. Her complete hysterectomy was in 2013.   She drinks water and oolong tea. She has recently started taking an herbal  supplement which she thinks has helped with the urgency in making it to the bathroom in time. At one time she consciously tried to stop drinking by 7pm but it didn't seem to decrease how often she woke up to urinate. She has lost 60 lbs recently. She is planning to discuss a sleep study when she sees her PCP next.   Results for orders placed or performed in visit on 09/25/22  Microscopic Examination   Urine  Result Value Ref Range   WBC, UA 0-5 0 - 5 /hpf   RBC, Urine 0-2 0 - 2 /hpf   Epithelial Cells (non renal) 0-10 0 - 10 /hpf   Bacteria, UA Few None seen/Few  Urinalysis, Complete  Result Value Ref Range   Specific Gravity, UA 1.025 1.005 - 1.030   pH, UA 5.5 5.0 - 7.5   Color, UA Yellow Yellow   Appearance Ur Clear Clear   Leukocytes,UA Negative Negative   Protein,UA Negative Negative/Trace   Glucose, UA Negative Negative   Ketones, UA Negative Negative   RBC, UA Negative Negative   Bilirubin, UA Negative Negative   Urobilinogen, Ur 0.2 0.2 - 1.0 mg/dL   Nitrite, UA Negative Negative   Microscopic Examination See below:   Bladder Scan (Post Void Residual) in office  Result Value Ref Range   Scan Result 14 ml      PMH: Past Medical History:  Diagnosis Date   Allergy    Arthritis    Coronary artery disease    Fatty liver    GERD (gastroesophageal  reflux disease)    Heart murmur    Hypertension    Hx   LIVER FUNCTION TESTS, ABNORMAL, HX OF 09/08/2007   Qualifier: Diagnosis of  By: Nelson-Smith CMA (AAMA), Dottie     Neuromuscular disorder [facility])    Essential Tremor   Overactive bladder     Surgical History: Past Surgical History:  Procedure Laterality Date   ABDOMINAL HYSTERECTOMY     COLONOSCOPY WITH PROPOFOL N/A 09/04/2018   Procedure: COLONOSCOPY WITH PROPOFOL;  Surgeon: Pasty Spillers, MD;  Location: [facility];  Service: Endoscopy;  Laterality: N/A;   ESOPHAGOGASTRODUODENOSCOPY (EGD) WITH PROPOFOL N/A 09/04/2018   Procedure:  ESOPHAGOGASTRODUODENOSCOPY (EGD) WITH PROPOFOL;  Surgeon: Pasty Spillers, MD;  Location: [facility];  Service: Endoscopy;  Laterality: N/A;   LAPAROSCOPY     for endometriosis x2   TONSILLECTOMY     TOTAL HIP ARTHROPLASTY Right 05/15/2022   Procedure: RIGHT TOTAL HIP ARTHROPLASTY ANTERIOR APPROACH;  Surgeon: Kathryne Hitch, MD;  Location[facility];  Service: Orthopedics;  Laterality: Right;   TOTAL KNEE ARTHROPLASTY Left 07/18/2021   Procedure: TOTAL KNEE ARTHROPLASTY;  Surgeon: Durene Romans, MD;  Location: [facility];  Service: Orthopedics;  Laterality: Left;   TURBINATE REDUCTION      Home Medications:  Allergies as of 09/25/2022       Reactions   Penicillins Itching   Tolerated Cephalosporin 07/18/21. Other reaction(s): wheezing   Amoxicillin Itching   Misc. Sulfonamide Containing Compounds    Rosanil Cleanser [sulfacetamide Sodium-sulfur] Other (See Comments)   Unknown reaction    Sulfonamide Derivatives    Unknown reaction    Ylang-ylang [cananga Oil (ylang-ylang)] Itching        Medication List        Accurate as of September 25, 2022  1:00 PM. If you have any questions, ask your nurse or doctor.          STOP taking these medications    aspirin 81 MG chewable tablet Stopped by: Wanda Lang   docusate sodium 100 MG capsule Commonly known as: COLACE Stopped by: Wanda Lang   Gemtesa 75 MG Tabs Generic drug: Vibegron Stopped by: Wanda Lang   hydroxychloroquine 200 MG tablet Commonly known as: PLAQUENIL Stopped by: Wanda Lang   polyethylene glycol 17 g packet Commonly known as: MIRALAX / GLYCOLAX Stopped by: Wanda Lang       TAKE these medications    acetaminophen 500 MG tablet Commonly known as: TYLENOL Take 1,000 mg by mouth every 6 (six) hours as needed for moderate pain.   b complex vitamins capsule Take 1 capsule by mouth daily.   Blue-Emu Hemp 10 % cream Generic drug: trolamine salicylate Apply 1 Application  topically as needed for muscle pain.   CALCIUM PO Take 1,200 mg by mouth daily.   celecoxib 200 MG capsule Commonly known as: CELEBREX Take 200 mg by mouth daily.   cycloSPORINE 0.05 % ophthalmic emulsion Commonly known as: RESTASIS Place 1 drop into both eyes 2 (two) times daily.   estradiol 0.05 MG/24HR patch Commonly known as: VIVELLE-DOT Place 1 patch onto the skin 2 (two) times a week.   Flaxseed Oil 1200 MG Caps Take 1,200 mg by mouth daily.   Glucosamine HCl 1000 MG Tabs Take 1,000 mg by mouth daily.   Magnesium 100 MG Tabs Take 100 mg by mouth 2 (two) times a week.   methocarbamol 500 MG tablet Commonly known as: ROBAXIN Take 500 mg by mouth every 6 (six) hours.  multivitamin with minerals Tabs tablet Take 1 tablet by mouth daily.   oxybutynin 15 MG 24 hr tablet Commonly known as: DITROPAN XL Take 1 tablet (15 mg total) by mouth daily. Started by: Wanda Lang   PROBIOTIC DAILY PO Take 1 Dose by mouth daily.   Vitamin D (Ergocalciferol) 1.25 MG (50000 UNIT) Caps capsule Commonly known as: DRISDOL Take 50,000 Units by mouth every 14 (fourteen) days.        Allergies:  Allergies  Allergen Reactions   Penicillins Itching    Tolerated Cephalosporin 07/18/21.   Other reaction(s): wheezing   Amoxicillin Itching   Misc. Sulfonamide Containing Compounds    Rosanil Cleanser [Sulfacetamide Sodium-Sulfur] Other (See Comments)    Unknown reaction    Sulfonamide Derivatives     Unknown reaction    Ylang-Ylang [Cananga Oil (Ylang-Ylang)] Itching    Family History: Family History  Problem Relation Age of Onset   Alcohol abuse Mother    Arthritis Mother    Hyperlipidemia Mother    Heart disease Mother    Stroke Mother    Hypertension Mother    Depression Mother    Anxiety disorder Mother    Arthritis Father    Lung cancer Paternal Grandfather    Kidney disease Paternal Grandfather     Social History:  reports that she has never smoked. She  has never used smokeless tobacco. She reports that she does not currently use alcohol after a past usage of about 1.0 standard drink of alcohol per week. She reports that she does not use drugs.   Physical Exam: BP (!) 144/82   Pulse 92   Ht 5' 4.5" (1.638 m)   Wt 207 lb 2 oz (94 kg)   BMI 35.00 kg/m   Constitutional:  Alert and oriented, No acute distress. HEENT: [place] AT, moist mucus membranes.  Trachea midline, no masses. Neurologic: Grossly intact, no focal deficits, moving all 4 extremities. Psychiatric: Normal mood and affect.   Urinalysis    Component Value Date/Time   COLORURINE YELLOW 01/05/2022 1139   APPEARANCEUR Clear 09/25/2022 1026   LABSPEC >=1.030 (A) 01/05/2022 1139   PHURINE 6.0 01/05/2022 1139   GLUCOSEU Negative 09/25/2022 1026   GLUCOSEU NEGATIVE 01/05/2022 1139   HGBUR NEGATIVE 01/05/2022 1139   BILIRUBINUR Negative 09/25/2022 1026   KETONESUR trace (5) (A) 01/05/2022 1143   KETONESUR NEGATIVE 01/05/2022 1139   PROTEINUR Negative 09/25/2022 1026   PROTEINUR NEGATIVE 10/26/2021 1303   UROBILINOGEN 0.2 06/01/2022 1228   UROBILINOGEN 0.2 01/05/2022 1139   NITRITE Negative 09/25/2022 1026   NITRITE POSITIVE (A) 01/05/2022 1139   LEUKOCYTESUR Negative 09/25/2022 1026   LEUKOCYTESUR TRACE (A) 01/05/2022 1139    Lab Results  Component Value Date   LABMICR See below: 09/25/2022   WBCUA 0-5 09/25/2022   LABEPIT 0-10 09/25/2022   BACTERIA Few 09/25/2022    Assessment & Plan:    OAB, severe  - Discussed the algorithm for treating overactive bladder is dictated by the AUA, but also the insurance. Generally, they like you to try at least two or three medications. She has tried two different classes of medications so far. Leslye Peer is the beta-3 agonist and Vesicare is an anticholinergic drug. Recommend one more drug and return in a month to evaluate the symptom (oxybutynin 15 mg XL)  - If the third medication doesn't help, will then consider other treatment  options including  nerve stimulation or Botox injections into the bladder,  implants, etc.  - Recommend lifestyle modification,  avoidance of beverages for bedtime to help with nocturia -Urinalysis is negative, empty bladder adequately.  No concern for underlying pathology this point in time.  2. Stress urinary incontinence  - Very mild. Minimal bother  3. Nocturia  - Contributing factors can include fluid distribution throughout the day or undiagnosed sleep apnea.  - Encouraged to stop drinking 4 hours prior to bedtime. Planning to discuss having a sleep study done when she sees her PCP next month.  Return in about 1 month (around 10/26/2022) for PVR and reasses urinary symptoms.  I have reviewed the above documentation for accuracy and completeness, and I agree with the above.   Wanda Scotland, MD   [facility] [address], [address] [place], [place] [address] [phone]

## 2022-09-25 NOTE — Therapy (Signed)
OUTPATIENT PHYSICAL THERAPY TREATMENT   Patient Name: Wanda Lang MRN: [id number] DOB:07/13/1950, 72 y.o., female Today's Date: 09/25/2022  END OF SESSION:  PT End of Session - 09/25/22 1549     Visit Number 9    Number of Visits 17    Date for PT Re-Evaluation 11/09/22    Authorization Type [employer] MCR    Progress Note Due on Visit 19    PT Start Time 1455   Pt arrives late   PT Stop Time 1530    PT Time Calculation (min) 35 min    Activity Tolerance Patient tolerated treatment well    Behavior During Therapy WFL for tasks assessed/performed                Past Medical History:  Diagnosis Date   Allergy    Arthritis    Coronary artery disease    Fatty liver    GERD (gastroesophageal reflux disease)    Heart murmur    Hypertension    Hx   LIVER FUNCTION TESTS, ABNORMAL, HX OF 09/08/2007   Qualifier: Diagnosis of  By: Nelson-Smith CMA (AAMA), Dottie     Neuromuscular disorder (HCC)    Essential Tremor   Overactive bladder    Past Surgical History:  Procedure Laterality Date   ABDOMINAL HYSTERECTOMY     COLONOSCOPY WITH PROPOFOL N/A 09/04/2018   Procedure: COLONOSCOPY WITH PROPOFOL;  Surgeon: Pasty Spillers, MD;  Location: [facility];  Service: Endoscopy;  Laterality: N/A;   ESOPHAGOGASTRODUODENOSCOPY (EGD) WITH PROPOFOL N/A 09/04/2018   Procedure: ESOPHAGOGASTRODUODENOSCOPY (EGD) WITH PROPOFOL;  Surgeon: Pasty Spillers, MD;  Location: [facility];  Service: Endoscopy;  Laterality: N/A;   LAPAROSCOPY     for endometriosis x2   TONSILLECTOMY     TOTAL HIP ARTHROPLASTY Right 05/15/2022   Procedure: RIGHT TOTAL HIP ARTHROPLASTY ANTERIOR APPROACH;  Surgeon: Kathryne Hitch, MD;  Location: [facility];  Service: Orthopedics;  Laterality: Right;   TOTAL KNEE ARTHROPLASTY Left 07/18/2021   Procedure: TOTAL KNEE ARTHROPLASTY;  Surgeon: Durene Romans, MD;  Location: [facility];  Service: Orthopedics;  Laterality: Left;   TURBINATE REDUCTION      Patient Active Problem List   Diagnosis Date Noted   S/P total hip arthroplasty 05/17/2022   Status post total replacement of right hip 05/15/2022   Overactive bladder 04/04/2022   Postmenopausal estrogen deficiency 11/13/2021   Primary osteoarthritis 11/13/2021   Leg length inequality 11/06/2021   Impaired ambulation 10/23/2021   Contact dermatitis 10/16/2021   SI joint arthritis 08/24/2021   Spinal stenosis, lumbar region, without neurogenic claudication 08/24/2021   Degeneration of lumbar intervertebral disc 07/24/2021   S/P total knee arthroplasty, left 07/18/2021   Preop examination 07/07/2021   Right hip pain 07/07/2021   Low back pain 07/07/2021   Drug-induced immunodeficiency (HCC) 06/12/2021   Impingement syndrome of right shoulder region 06/12/2021   COVID-19 01/24/2021   Heart murmur 11/24/2020   Hammer toe 04/22/2020   Metatarsalgia of left foot 04/22/2020   Osteoarthritis of midfoot 04/22/2020   Duodenal ulcer 11/12/2019   Stress 11/12/2019   Morbid obesity (HCC) 11/06/2019   Pain in left knee 07/30/2019   Aortic stenosis, moderate 02/06/2018   Maxillary sinusitis, chronic 10/24/2017   Chronic diarrhea 10/09/2017   Acid reflux 07/15/2017   Asymptomatic carotid artery stenosis 07/15/2017   Cyst of skin 07/15/2017   Immunocompromised (HCC) 11/09/2016   Allergic rhinitis 11/02/2016   Benign essential tremor 11/02/2016   History of hypertension 11/02/2016  Postmenopausal symptoms 07/01/2015   Seasonal allergic rhinitis due to pollen 07/01/2015   Fatty liver 09/08/2007   Rheumatoid arthritis (HCC) 09/08/2007    REFERRING PROVIDER:   Kathryne Hitch, MD    REFERRING DIAG:  R26.2 (ICD-10-CM) - Impaired ambulation  M15.9 (ICD-10-CM) - Primary osteoarthritis involving multiple joints  S/p Rt THA 05/15/22  Rationale for Evaluation and Treatment: Rehabilitation  THERAPY DIAG:  Muscle weakness (generalized)  Other abnormalities of gait and  mobility  ONSET DATE: Rt THA 05/15/22, deconditioned prior to surgery   SUBJECTIVE:                                                                                                                                                                                           SUBJECTIVE STATEMENT: Back has not bothered me in about a month.  Left groin pain and pain in ant aspect of hip with hip flex/lifting leg    PERTINENT HISTORY:  LLD noted prior to surgery & unable to fully correct  PAIN:  Are you having pain? yes : NPRS scale: 3/10 Pain location: left groin pain and ant hip pain with active flex >70d Pain description: sore Aggravating factors: SLS Relieving factors: knee flexed toes turned in  PRECAUTIONS:  Fall  WEIGHT BEARING RESTRICTIONS:  No  FALLS:  Has patient fallen in last 6 months? No  LIVING ENVIRONMENT: Lives with: lives alone Lives in: House/apartment Stairs: yes, also has stair lift Has following equipment at home:  walking stick   PLOF:  Independent  PATIENT GOALS:  Decrease pain, day to day stuff   OBJECTIVE:    LOWER EXTREMITY MMT:    MMT Right 09/25/22 Left 09/25/22  Hip flexion 34.2 53.7  Hip extension    Hip abduction 35.4 40.5  Hip adduction    Hip internal rotation    Hip external rotation    Knee flexion    Knee extension 39.0 43.9  Ankle dorsiflexion    Ankle plantarflexion    Ankle inversion    Ankle eversion     (Blank rows = not tested)  PATIENT SURVEYS:  FOTO 38 09/14/22:44% 09/25/22: 49%  COGNITIVE STATUS: Within functional limits for tasks assessed   RED FLAGS: None    SENSATION: WFL  POSTURE:  Eval: decr lumbar lordosis in standing, slight forward flexion, weight shifted to the Lt  GAIT: EVAL: antalgic gait with walking stick, +Rt trendelenburg   Body Part #1 Hip  PALPATION: EVAL: limited mobility in Rt SIJ; notable anterior rotation of Rt innominate concordant pain in trigger points in Rt gluts,  piriformis    FUNCTIONAL TESTS:  Timed up and  go (TUG): NT at eval due to pain 3 minute walk test: NT at eval 30s chair stand test   09/06/22: 5x STS= 18.36sec (from hot tub surround) 09/25/22: TUG=14.51     5x STS=1404     3 min walk test=371ft (around perimeter of pool on deck in pool shoes) using walking stick      TREATMENT:       09/25/22 Re-assess/cert Functional testing Foto  09/20/22 Pt seen for aquatic therapy today.  Treatment took place in water 3.5-4.75 ft in depth at the [facility] pool. Temp of water was 91.  Pt entered/exited the pool via stairs independently in step-to pattern with bilat rail.  * walking unsupported forward/ backward x 5 laps with cues for increased step height of RLE * tandem gait forward/ backward, hands out of water 2 laps  * monster walk forward/ backward * farmer carry with solid noodle held at side, with forward/backward marching  * TrA set with solid noodle pull down to thighs, isometric holds on return to surface x 10 * UE on solid noodle:  leg swings into hip flex/ ext x 10; single leg heel raises x 10 each;  * SLS with noodle stomp (solid noodle), no UE support x 10 slow *STS 3rd step x 5, 4th step x 5, slow eccentric lowering to step   8/11  Nustep warm up 5 min, Lvl 3  Prone quad stretch with strap 30s 4x  Supine piriformis stretch 30s 4x Bridge with GTB at knees 3x8 Standing hip flexor stretch 30s 4x  8" box step up with UE 3x8   PATIENT EDUCATION:  Education details:exercise form/rationale Person educated: Patient Education method: Explanation, Demonstration, Tactile cues, Verbal cues,  Education comprehension: verbalized understanding, returned demonstration, verbal cues required, tactile cues required, and needs further education  HOME EXERCISE PROGRAM: [id number]   ASSESSMENT:  CLINICAL IMPRESSION: PN:Pt demonstrates improvements in mobility, strength, balance and toleration to activity as documented  above in aquatic setting.  She is amb with a walking stick walking to and from setting (500 ft each way) and reports amb in home without AD.  She continues to report right groin pain 3/10 daily and ant hip pain with active flex >~70d but not limiting any of her function. Was able to get strength measurements using HD in lbs.  She completed 3 minute walk test and 5 x STS without difficulty.  She will continue to benefit from skilled physical therapy intervention for next 2 weeks transitioning from pool to land (alternating) then will be followed  x 4 more weeks land based only as a progression in order to reach goals and maximize functional R LE strength and ROM.   OBJECTIVE IMPAIRMENTS: Abnormal gait, decreased activity tolerance, decreased endurance, difficulty walking, decreased ROM, decreased strength, increased muscle spasms, improper body mechanics, postural dysfunction, and pain.   ACTIVITY LIMITATIONS: carrying, lifting, bending, sitting, squatting, stairs, transfers, bed mobility, bathing, and locomotion level  PARTICIPATION LIMITATIONS: meal prep, cleaning, laundry, shopping, community activity, occupation, yard work, and church  PERSONAL FACTORS: see PMH  REHAB POTENTIAL: Good  CLINICAL DECISION MAKING: Evolving/moderate complexity  EVALUATION COMPLEXITY: Moderate   GOALS: Goals reviewed with patient? Yes  SHORT TERM GOALS: Target date: 7/26  Able to demo proper hip ext in toe off phase of gait Baseline: Goal status: Met - 09/06/22   LONG TERM GOALS: Target date: 11/09/22  Meet FOTO goal Baseline:  Goal status: Met 09/14/22  2.  Find location to continue aquatic HEP  Baseline:  Goal status: in progress 09/25/22  3.  5TSTS to 20s or less Baseline:  Goal status: Met - 09/06/22  4.  TUG to 13.5s to indicate low fall risk and proper functional ability Baseline:  Goal status: in progress (14.51 09/25/22)   5. 3 minute walk test to 425 or > ft to demonstrate improved  toleration to amb and level of mobility.    Baseline: 325    Goal Status: New    6. Pt will amb without AD safety    Baseline: walking stick    Goal status: New    7. Pt will be indep with final HEP's (land and aquatic as appropriate) for continued management of condition     Baseline: aquatic to be assigned    Goal Status: New  8. Pt will improve strength in R hip flex to at least 5lb from contralateral side to demonstrate improved overall   physical function   Baseline: see chart  Goal Status: New   PLAN:  PT FREQUENCY: 1-2x/week  PT DURATION: 6 weeks 8 visits  PLANNED INTERVENTIONS: Therapeutic exercises, Therapeutic activity, Neuromuscular re-education, Balance training, Gait training, Patient/Family education, Self Care, Joint mobilization, Stair training, Aquatic Therapy, Dry Needling, Electrical stimulation, Spinal mobilization, Cryotherapy, Moist heat, Taping, Traction, Ionotophoresis 4mg /ml Dexamethasone, Manual therapy, and Re-evaluation.  PLAN FOR NEXT SESSION: Land: progress hip strengthening and ROM. Gait training, amb toleration   Rushie Chestnut) Jamarrius Salay MPT 09/25/22 3:51 PM [facility] [address] [place], [place], [address] Phone: [phone]   Fax:  [phone]      Referring diagnosis? R26.2 M15.9 Treatment diagnosis? (if different than referring diagnosis) M25.551 M62.81 R26.89 What was this (referring dx) caused by? [x]  Surgery []  Fall []  Ongoing issue []  Arthritis []  Other: ____________  Laterality: [x]  Rt []  Lt []  Both  Check all possible CPT codes:  *CHOOSE 10 OR LESS*    []  97110 (Therapeutic Exercise)  []  92507 (SLP Treatment)  []  97112 (Neuro Re-ed)   []  92526 (Swallowing Treatment)   []  44034 (Gait Training)   []  74259 (Cognitive Training, 1st 15 minutes) []  97140 (Manual Therapy)   []  97130 (Cognitive Training, each add'l 15 minutes)  []  97164 (Re-evaluation)                               []  Other, List CPT Code ____________  []  97530 (Therapeutic Activities)     []  97535 (Self Care)   [x]  All codes above (97110 - 97535)  []  56387 (Mechanical Traction)  [x]  97014 (E-stim Unattended)  []  97032 (E-stim manual)  [x]  97033 (Ionto)  [x]  97035 (Ultrasound) [x]  97750 (Physical Performance Training) [x]  [id number] (Aquatic Therapy) []  97016 (Vasopneumatic Device) []  [id number] (Paraffin) []  97034 (Contrast Bath) []  97597 (Wound Care 1st 20 sq cm) []  97598 (Wound Care each add'l 20 sq cm) []  97760 (Orthotic Fabrication, Fitting, Training Initial) []  [id number] (Prosthetic Management and Training Initial) []  [id number] (Orthotic or Prosthetic Training/ Modification Subsequent)

## 2022-09-27 ENCOUNTER — Other Ambulatory Visit (INDEPENDENT_AMBULATORY_CARE_PROVIDER_SITE_OTHER): Payer: Medicare PPO

## 2022-09-27 ENCOUNTER — Encounter: Payer: Self-pay | Admitting: Orthopaedic Surgery

## 2022-09-27 ENCOUNTER — Ambulatory Visit: Payer: Medicare PPO | Admitting: Orthopaedic Surgery

## 2022-09-27 DIAGNOSIS — Z96641 Presence of right artificial hip joint: Secondary | ICD-10-CM

## 2022-09-27 NOTE — Progress Notes (Signed)
The patient is here today for follow-up and 4-1/19-month status post a right direct anterior total hip arthroplasty.  She had quite a severe deformity of the right hip prior to surgery.  She does ambulate with a walking stick.  She denies any symptoms of instability.  She does have a leg length difference with that right side still shorter than the left but it was significantly contracted and we could only get her out so far as far as her length goes.  Overall she is pleased.  Her right operative hip was smoothly and fluidly.  Incisions healed nicely.  She is slightly shorter on the right than the left.  Standing AP pelvis shows a well-seated total hip arthroplasty with no complicating features.  From my standpoint she will continue to increase her activities as she tolerates.  Will see her back in 8 months which will be the 1 year standpoint from surgery will have a repeat standing low AP pelvis and lateral of that right operative hip.

## 2022-09-28 ENCOUNTER — Encounter (HOSPITAL_BASED_OUTPATIENT_CLINIC_OR_DEPARTMENT_OTHER): Payer: Self-pay | Admitting: Physical Therapy

## 2022-09-28 ENCOUNTER — Telehealth: Payer: Self-pay | Admitting: *Deleted

## 2022-09-28 ENCOUNTER — Ambulatory Visit (HOSPITAL_BASED_OUTPATIENT_CLINIC_OR_DEPARTMENT_OTHER): Payer: Medicare PPO | Admitting: Physical Therapy

## 2022-09-28 DIAGNOSIS — M5459 Other low back pain: Secondary | ICD-10-CM | POA: Diagnosis not present

## 2022-09-28 DIAGNOSIS — M25551 Pain in right hip: Secondary | ICD-10-CM | POA: Diagnosis not present

## 2022-09-28 DIAGNOSIS — R2689 Other abnormalities of gait and mobility: Secondary | ICD-10-CM

## 2022-09-28 DIAGNOSIS — M6281 Muscle weakness (generalized): Secondary | ICD-10-CM | POA: Diagnosis not present

## 2022-09-28 NOTE — Telephone Encounter (Signed)
Ortho bundle 90 day in office visit completed with survey.

## 2022-09-28 NOTE — Therapy (Signed)
OUTPATIENT PHYSICAL THERAPY TREATMENT   Patient Name: Wanda Lang MRN: [id number] DOB:[birth year], 72 y.o., female Today's Date: 09/28/2022  END OF SESSION:  PT End of Session - 09/28/22 1435     Visit Number 10    Number of Visits 17    Date for PT Re-Evaluation 11/09/22    Authorization Type humana MCR    Progress Note Due on Visit 19    PT Start Time 1433    PT Stop Time 1511    PT Time Calculation (min) 38 min    Activity Tolerance Patient tolerated treatment well    Behavior During Therapy WFL for tasks assessed/performed                Past Medical History:  Diagnosis Date   Allergy    Arthritis    Coronary artery disease    Fatty liver    GERD (gastroesophageal reflux disease)    Heart murmur    Hypertension    Hx   LIVER FUNCTION TESTS, ABNORMAL, HX OF 09/08/2007   Qualifier: Diagnosis of  By: Nelson-Smith CMA (AAMA), Dottie     Neuromuscular disorder (HCC)    Essential Tremor   Overactive bladder    Past Surgical History:  Procedure Laterality Date   ABDOMINAL HYSTERECTOMY     COLONOSCOPY WITH PROPOFOL N/A 09/04/2018   Procedure: COLONOSCOPY WITH PROPOFOL;  Surgeon: Pasty Spillers, MD;  Location: [facility];  Service: Endoscopy;  Laterality: N/A;   ESOPHAGOGASTRODUODENOSCOPY (EGD) WITH PROPOFOL N/A 09/04/2018   Procedure: ESOPHAGOGASTRODUODENOSCOPY (EGD) WITH PROPOFOL;  Surgeon: Pasty Spillers, MD;  Location: [facility];  Service: Endoscopy;  Laterality: N/A;   LAPAROSCOPY     for endometriosis x2   TONSILLECTOMY     TOTAL HIP ARTHROPLASTY Right 05/15/2022   Procedure: RIGHT TOTAL HIP ARTHROPLASTY ANTERIOR APPROACH;  Surgeon: Kathryne Hitch, MD;  Location: [facility] OR;  Service: Orthopedics;  Laterality: Right;   TOTAL KNEE ARTHROPLASTY Left 07/18/2021   Procedure: TOTAL KNEE ARTHROPLASTY;  Surgeon: Durene Romans, MD;  Location: [facility];  Service: Orthopedics;  Laterality: Left;   TURBINATE REDUCTION     Patient Active  Problem List   Diagnosis Date Noted   S/P total hip arthroplasty 05/17/2022   Status post total replacement of right hip 05/15/2022   Overactive bladder 04/04/2022   Postmenopausal estrogen deficiency 11/13/2021   Primary osteoarthritis 11/13/2021   Leg length inequality 11/06/2021   Impaired ambulation 10/23/2021   Contact dermatitis 10/16/2021   SI joint arthritis 08/24/2021   Spinal stenosis, lumbar region, without neurogenic claudication 08/24/2021   Degeneration of lumbar intervertebral disc 07/24/2021   S/P total knee arthroplasty, left 07/18/2021   Preop examination 07/07/2021   Right hip pain 07/07/2021   Low back pain 07/07/2021   Drug-induced immunodeficiency (HCC) 06/12/2021   Impingement syndrome of right shoulder region 06/12/2021   COVID-19 01/24/2021   Heart murmur 11/24/2020   Hammer toe 04/22/2020   Metatarsalgia of left foot 04/22/2020   Osteoarthritis of midfoot 04/22/2020   Duodenal ulcer 11/12/2019   Stress 11/12/2019   Morbid obesity (HCC) 11/06/2019   Pain in left knee 07/30/2019   Aortic stenosis, moderate 02/06/2018   Maxillary sinusitis, chronic 10/24/2017   Chronic diarrhea 10/09/2017   Acid reflux 07/15/2017   Asymptomatic carotid artery stenosis 07/15/2017   Cyst of skin 07/15/2017   Immunocompromised (HCC) 11/09/2016   Allergic rhinitis 11/02/2016   Benign essential tremor 11/02/2016   History of hypertension 11/02/2016   Postmenopausal symptoms 07/01/2015  Seasonal allergic rhinitis due to pollen 07/01/2015   Fatty liver 09/08/2007   Rheumatoid arthritis (HCC) 09/08/2007    REFERRING PROVIDER:   Kathryne Hitch, MD    REFERRING DIAG:  R26.2 (ICD-10-CM) - Impaired ambulation  M15.9 (ICD-10-CM) - Primary osteoarthritis involving multiple joints  S/p Rt THA 05/15/22  Rationale for Evaluation and Treatment: Rehabilitation  THERAPY DIAG:  Muscle weakness (generalized)  Other abnormalities of gait and mobility  Other low  back pain  Pain in right hip  ONSET DATE: Rt THA 05/15/22, deconditioned prior to surgery   SUBJECTIVE:                                                                                                                                                                                           SUBJECTIVE STATEMENT: Pt saw hip surgeon yesterday, "it's moving like a normal hip.  He thought I am doing great".  She reports he wants to wait to make any adjustments to her Rt shoe.     PERTINENT HISTORY:  LLD noted prior to surgery & unable to fully correct  PAIN:  Are you having pain? yes : NPRS scale: 3/10 when moving R hip into flexion, but 0/10 when at rest  Pain location: left groin pain and ant hip pain with active flex >70d Pain description: sore Aggravating factors: SLS Relieving factors: knee flexed toes turned in  PRECAUTIONS:  Fall  WEIGHT BEARING RESTRICTIONS:  No  FALLS:  Has patient fallen in last 6 months? No  LIVING ENVIRONMENT: Lives with: lives alone Lives in: House/apartment Stairs: yes, also has stair lift Has following equipment at home:  walking stick   PLOF:  Independent  PATIENT GOALS:  Decrease pain, day to day stuff   OBJECTIVE:    LOWER EXTREMITY MMT:    MMT Right 09/25/22 Left 09/25/22  Hip flexion 34.2 53.7  Hip extension    Hip abduction 35.4 40.5  Hip adduction    Hip internal rotation    Hip external rotation    Knee flexion    Knee extension 39.0 43.9  Ankle dorsiflexion    Ankle plantarflexion    Ankle inversion    Ankle eversion     (Blank rows = not tested)  PATIENT SURVEYS:  FOTO 38 09/14/22:44% 09/25/22: 49%  COGNITIVE STATUS: Within functional limits for tasks assessed   RED FLAGS: None    SENSATION: WFL  POSTURE:  Eval: decr lumbar lordosis in standing, slight forward flexion, weight shifted to the Lt  GAIT: EVAL: antalgic gait with walking stick, +Rt trendelenburg   Body Part #1 Hip  PALPATION: EVAL:  limited mobility  in Rt SIJ; notable anterior rotation of Rt innominate concordant pain in trigger points in Rt gluts, piriformis    FUNCTIONAL TESTS:  Timed up and go (TUG): NT at eval due to pain 3 minute walk test: NT at eval 30s chair stand test   09/06/22: 5x STS= 18.36sec (from hot tub surround) 09/25/22: TUG=14.51     5x STS=1404     3 min walk test=398ft (around perimeter of pool on deck in pool shoes) using walking stick      TODAY'S TREATMENT:       09/28/22: Therapeutic exercise:  *Recumbent bike (lifetime fitness) L1 x 2.5 min for warm up *Side stepping R/L with UE on rail with increased step height  * tandem gait forward / backward with intermittent UE support on rail * Rt single heel raises x 5 * NuStep, LE only, L3 x 4 min  * stairs -reciprocal pattern with BUE on rails x 12 up/down * alternating slow toe taps to 7" step x 10 without UE support * R/L/R quad stretch with foot on 7" step * reviewed sit to/from supine via log roll  * Lt sidelying:  Rt clams x 10, reverse - alternating ER/IR x 10 * 5 bridges   09/25/22 Re-assess/cert Functional testing Foto  09/20/22 Pt seen for aquatic therapy today.  Treatment took place in water 3.5-4.75 ft in depth at the [facility] pool. Temp of water was 91.  Pt entered/exited the pool via stairs independently in step-to pattern with bilat rail.  * walking unsupported forward/ backward x 5 laps with cues for increased step height of RLE * tandem gait forward/ backward, hands out of water 2 laps  * monster walk forward/ backward * farmer carry with solid noodle held at side, with forward/backward marching  * TrA set with solid noodle pull down to thighs, isometric holds on return to surface x 10 * UE on solid noodle:  leg swings into hip flex/ ext x 10; single leg heel raises x 10 each;  * SLS with noodle stomp (solid noodle), no UE support x 10 slow *STS 3rd step x 5, 4th step x 5, slow eccentric lowering to step    8/11  Nustep warm up 5 min, Lvl 3  Prone quad stretch with strap 30s 4x  Supine piriformis stretch 30s 4x Bridge with GTB at knees 3x8 Standing hip flexor stretch 30s 4x  8" box step up with UE 3x8   PATIENT EDUCATION:  Education details:exercise form/rationale Person educated: Patient Education method: Explanation, Demonstration, Tactile cues, Verbal cues,  Education comprehension: verbalized understanding, returned demonstration, verbal cues required, tactile cues required, and needs further education  HOME EXERCISE PROGRAM: Access Code: [email or web address] URL: [email or web address] Date: 09/28/2022 Prepared by: [facility] [address]  Exercises - Supine Piriformis Stretch with Foot on Ground  - 2 x daily - 7 x weekly - 1 sets - 3 reps - 30 hold - Supine Bridge with Mini Swiss Ball Between Knees  - 1 x daily - 7 x weekly - 2 sets - 10 reps - 3s hold - Step Up  - 1 x daily - 7 x weekly - 2 sets - 10 reps - Quadricep Stretch with Chair and Counter Support  - 1 x daily - 7 x weekly - 2-3 reps - 20 seconds  hold - Side Stepping with Counter Support  - 1 x daily - 7 x weekly - 2 sets - 10 reps - Standing Toe Taps  -  1 x daily - 7 x weekly - 1-2 sets - 10 reps   ASSESSMENT:  CLINICAL IMPRESSION: Pt reported some increase in Rt ant hip discomfort with recumbent bicycle and marching.  Otherwise tolerated land exercises well. Updated HEP and will issue at next visit.  She will continue to benefit from skilled physical therapy intervention for next 2 weeks transitioning from pool to land (alternating) then will be followed  x 4 more weeks land based only as a progression in order to reach goals and maximize functional R LE strength and ROM.   OBJECTIVE IMPAIRMENTS: Abnormal gait, decreased activity tolerance, decreased endurance, difficulty walking, decreased ROM, decreased strength, increased muscle spasms, improper body mechanics, postural dysfunction, and  pain.   ACTIVITY LIMITATIONS: carrying, lifting, bending, sitting, squatting, stairs, transfers, bed mobility, bathing, and locomotion level  PARTICIPATION LIMITATIONS: meal prep, cleaning, laundry, shopping, community activity, occupation, yard work, and church  PERSONAL FACTORS: see PMH  REHAB POTENTIAL: Good  CLINICAL DECISION MAKING: Evolving/moderate complexity  EVALUATION COMPLEXITY: Moderate   GOALS: Goals reviewed with patient? Yes  SHORT TERM GOALS: Target date: 7/26  Able to demo proper hip ext in toe off phase of gait Baseline: Goal status: Met - 09/06/22   LONG TERM GOALS: Target date: 11/09/22  Meet FOTO goal Baseline:  Goal status: Met 09/14/22  2.  Find location to continue aquatic HEP Baseline:  Goal status: in progress 09/25/22  3.  5TSTS to 20s or less Baseline:  Goal status: Met - 09/06/22  4.  TUG to 13.5s to indicate low fall risk and proper functional ability Baseline:  Goal status: in progress (14.51 09/25/22)   5. 3 minute walk test to 425 or > ft to demonstrate improved toleration to amb and level of mobility.    Baseline: 325    Goal Status: New    6. Pt will amb without AD safety    Baseline: walking stick    Goal status: New    7. Pt will be indep with final HEP's (land and aquatic as appropriate) for continued management of condition     Baseline: aquatic to be assigned    Goal Status: New  8. Pt will improve strength in R hip flex to at least 5lb from contralateral side to demonstrate improved overall   physical function   Baseline: see chart  Goal Status: New   PLAN:  PT FREQUENCY: 1-2x/week  PT DURATION: 6 weeks 8 visits  PLANNED INTERVENTIONS: Therapeutic exercises, Therapeutic activity, Neuromuscular re-education, Balance training, Gait training, Patient/Family education, Self Care, Joint mobilization, Stair training, Aquatic Therapy, Dry Needling, Electrical stimulation, Spinal mobilization, Cryotherapy, Moist heat, Taping,  Traction, Ionotophoresis 4mg /ml Dexamethasone, Manual therapy, and Re-evaluation.  PLAN FOR NEXT SESSION: Land: progress hip strengthening and ROM. Gait training, amb toleration    Mayer Camel, PTA 09/28/22 4:43 PM [facility] [address] [place], [place], [address] Phone: [phone]   Fax:  [phone]     Referring diagnosis? R26.2 M15.9 Treatment diagnosis? (if different than referring diagnosis) M25.551 M62.81 R26.89 What was this (referring dx) caused by? [x]  Surgery []  Fall []  Ongoing issue []  Arthritis []  Other: ____________  Laterality: [x]  Rt []  Lt []  Both  Check all possible CPT codes:  *CHOOSE 10 OR LESS*    []  97110 (Therapeutic Exercise)  []  92507 (SLP Treatment)  []  97112 (Neuro Re-ed)   []  92526 (Swallowing Treatment)   []  97116 (Gait Training)   []  [id number] (Cognitive Training, 1st 15 minutes) []   09811 (Manual Therapy)   []  97130 (Cognitive Training, each add'l 15 minutes)  []  97164 (Re-evaluation)                              []  Other, List CPT Code ____________  []  97530 (Therapeutic Activities)     []  97535 (Self Care)   [x]  All codes above (97110 - 97535)  []  97012 (Mechanical Traction)  [x]  97014 (E-stim Unattended)  []  97032 (E-stim manual)  [x]  97033 (Ionto)  [x]  97035 (Ultrasound) [x]  91478 (Physical Performance Training) [x]  [id number] (Aquatic Therapy) []  97016 (Vasopneumatic Device) []  [id number] (Paraffin) []  97034 (Contrast Bath) []  97597 (Wound Care 1st 20 sq cm) []  97598 (Wound Care each add'l 20 sq cm) []  97760 (Orthotic Fabrication, Fitting, Training Initial) []  [id number] (Prosthetic Management and Training Initial) []  [id number] (Orthotic or Prosthetic Training/ Modification Subsequent)

## 2022-10-01 NOTE — Telephone Encounter (Signed)
Pt called in stating that she saw her Urology doctor and they recommend for her to let provider know that she needs to get a sleep study test.

## 2022-10-02 ENCOUNTER — Ambulatory Visit (HOSPITAL_BASED_OUTPATIENT_CLINIC_OR_DEPARTMENT_OTHER): Payer: Medicare PPO | Admitting: Physical Therapy

## 2022-10-02 ENCOUNTER — Encounter (HOSPITAL_BASED_OUTPATIENT_CLINIC_OR_DEPARTMENT_OTHER): Payer: Self-pay | Admitting: Physical Therapy

## 2022-10-02 DIAGNOSIS — M25551 Pain in right hip: Secondary | ICD-10-CM

## 2022-10-02 DIAGNOSIS — M6281 Muscle weakness (generalized): Secondary | ICD-10-CM | POA: Diagnosis not present

## 2022-10-02 DIAGNOSIS — R2689 Other abnormalities of gait and mobility: Secondary | ICD-10-CM | POA: Diagnosis not present

## 2022-10-02 DIAGNOSIS — M5459 Other low back pain: Secondary | ICD-10-CM

## 2022-10-02 NOTE — Telephone Encounter (Signed)
Patient is scheduled for 10/05/22 at 8:20.

## 2022-10-02 NOTE — Therapy (Signed)
OUTPATIENT PHYSICAL THERAPY TREATMENT   Patient Name: Wanda Lang MRN: [id number] DOB:August 01, 1950, 72 y.o., female Today's Date: 10/02/2022  END OF SESSION:  PT End of Session - 10/02/22 1454     Visit Number 11    Number of Visits 17    Date for PT Re-Evaluation 11/09/22    Authorization Type human[employer] MCR    Progress Note Due on Visit 19    PT Start Time 1453   pt arrived late to pool area   PT Stop Time 1531    PT Time Calculation (min) 38 min    Activity Tolerance Patient tolerated treatment well    Behavior During Therapy WFL for tasks assessed/performed                Past Medical History:  Diagnosis Date   Allergy    Arthritis    Coronary artery disease    Fatty liver    GERD (gastroesophageal reflux disease)    Heart murmur    Hypertension    Hx   LIVER FUNCTION TESTS, ABNORMAL, HX OF 09/08/2007   Qualifier: Diagnosis of  By: Nelson-Smith CMA (AAMA), Dottie     Neuromuscular disorder (HCC)    Essential Tremor   Overactive bladder    Past Surgical History:  Procedure Laterality Date   ABDOMINAL HYSTERECTOMY     COLONOSCOPY WITH PROPOFOL N/A 09/04/2018   Procedure: COLONOSCOPY WITH PROPOFOL;  Surgeon: Pasty Spillers, MD;  Location: [facility];  Service: Endoscopy;  Laterality: N/A;   ESOPHAGOGASTRODUODENOSCOPY (EGD) WITH PROPOFOL N/A 09/04/2018   Procedure: ESOPHAGOGASTRODUODENOSCOPY (EGD) WITH PROPOFOL;  Surgeon: Pasty Spillers, MD;  Location: [facility] END[facility];  Service: Endoscopy;  Laterality: N/A;   LAPAROSCOPY     for endometriosis x2   TONSILLECTOMY     TOTAL HIP ARTHROPLASTY Right 05/15/2022   Procedure: RIGHT TOTAL HIP ARTHROPLASTY ANTERIOR APPROACH;  Surgeon: Kathryne Hitch, MD;  Location: [facility] OR;  Service: Orthopedics;  Laterality: Right;   TOTAL KNEE ARTHROPLASTY Left 07/18/2021   Procedure: TOTAL KNEE ARTHROPLASTY;  Surgeon: Durene Romans, MD;  Location: [facility];  Service: Orthopedics;  Laterality: Left;   TURBINATE  REDUCTION     Patient Active Problem List   Diagnosis Date Noted   S/P total hip arthroplasty 05/17/2022   Status post total replacement of right hip 05/15/2022   Overactive bladder 04/04/2022   Postmenopausal estrogen deficiency 11/13/2021   Primary osteoarthritis 11/13/2021   Leg length inequality 11/06/2021   Impaired ambulation 10/23/2021   Contact dermatitis 10/16/2021   SI joint arthritis 08/24/2021   Spinal stenosis, lumbar region, without neurogenic claudication 08/24/2021   Degeneration of lumbar intervertebral disc 07/24/2021   S/P total knee arthroplasty, left 07/18/2021   Preop examination 07/07/2021   Right hip pain 07/07/2021   Low back pain 07/07/2021   Drug-induced immunodeficiency (HCC) 06/12/2021   Impingement syndrome of right shoulder region 06/12/2021   COVID-19 01/24/2021   Heart murmur 11/24/2020   Hammer toe 04/22/2020   Metatarsalgia of left foot 04/22/2020   Osteoarthritis of midfoot 04/22/2020   Duodenal ulcer 11/12/2019   Stress 11/12/2019   Morbid obesity (HCC) 11/06/2019   Pain in left knee 07/30/2019   Aortic stenosis, moderate 02/06/2018   Maxillary sinusitis, chronic 10/24/2017   Chronic diarrhea 10/09/2017   Acid reflux 07/15/2017   Asymptomatic carotid artery stenosis 07/15/2017   Cyst of skin 07/15/2017   Immunocompromised (HCC) 11/09/2016   Allergic rhinitis 11/02/2016   Benign essential tremor 11/02/2016   History of  hypertension 11/02/2016   Postmenopausal symptoms 07/01/2015   Seasonal allergic rhinitis due to pollen 07/01/2015   Fatty liver 09/08/2007   Rheumatoid arthritis (HCC) 09/08/2007    REFERRING PROVIDER:   Kathryne Hitch, MD    REFERRING DIAG:  R26.2 (ICD-10-CM) - Impaired ambulation  M15.9 (ICD-10-CM) - Primary osteoarthritis involving multiple joints  S/p Rt THA 05/15/22  Rationale for Evaluation and Treatment: Rehabilitation  THERAPY DIAG:  Muscle weakness (generalized)  Other abnormalities of  gait and mobility  Other low back pain  Pain in right hip  ONSET DATE: Rt THA 05/15/22, deconditioned prior to surgery   SUBJECTIVE:                                                                                                                                                                                           SUBJECTIVE STATEMENT: Pt reports that she was sore the day after last (land) session.  Today, feeling pretty good.  She plans to join [facility], but hasn't yet.     PERTINENT HISTORY:  LLD noted prior to surgery & unable to fully correct  PAIN:  Are you having pain? no : NPRS scale: 0/10  Pain location: Rt hip  Pain description:  Aggravating factors: SLS Relieving factors: knee flexed toes turned in  PRECAUTIONS:  Fall  WEIGHT BEARING RESTRICTIONS:  No  FALLS:  Has patient fallen in last 6 months? No  LIVING ENVIRONMENT: Lives with: lives alone Lives in: House/apartment Stairs: yes, also has stair lift Has following equipment at home:  walking stick   PLOF:  Independent  PATIENT GOALS:  Decrease pain, day to day stuff   OBJECTIVE:    LOWER EXTREMITY MMT:    MMT Right 09/25/22 Left 09/25/22  Hip flexion 34.2 53.7  Hip extension    Hip abduction 35.4 40.5  Hip adduction    Hip internal rotation    Hip external rotation    Knee flexion    Knee extension 39.0 43.9  Ankle dorsiflexion    Ankle plantarflexion    Ankle inversion    Ankle eversion     (Blank rows = not tested)  PATIENT SURVEYS:  FOTO 38 09/14/22:44% 09/25/22: 49%  COGNITIVE STATUS: Within functional limits for tasks assessed   RED FLAGS: None    SENSATION: WFL  POSTURE:  Eval: decr lumbar lordosis in standing, slight forward flexion, weight shifted to the Lt  GAIT: EVAL: antalgic gait with walking stick, +Rt trendelenburg   Body Part #1 Hip  PALPATION: EVAL: limited mobility in Rt SIJ; notable anterior rotation of Rt innominate concordant pain in trigger points in  Rt gluts,  piriformis    FUNCTIONAL TESTS:  Timed up and go (TUG): NT at eval due to pain 3 minute walk test: NT at eval 30s chair stand test   09/06/22: 5x STS= 18.36sec (from hot tub surround) 09/25/22: TUG=14.51     5x STS=1404     3 min walk test=357ft (around perimeter of pool on deck in pool shoes) using walking stick      TODAY'S TREATMENT:       10/02/22 Pt seen for aquatic therapy today.  Treatment took place in water 3.5-4.75 ft in depth at the [facility] pool. Temp of water was 91.  Pt entered/exited the pool via stairs independently in step-to pattern with bilat rail. * farmer carry with single/ bilat yellow hand floats held at side, with forward/backward/ sideways marching  * side stepping -> side squat with arm addct with yellow hand floats  * 3 way LE kick, UE on yellow hand floats 3 sets of 5 * TrA set with solid noodle pull down to thighs, isometric holds on return to surface x 10 * SLS with noodle stomp (hollow noodle), no UE support x 10 slow, 10 fast -> yellow noodle  * standing balance on yellow noodle, marching (good challenge)  * seated balance on noodle with single hand lifts (challenge)  * monster walk forward/ backward * tandem gait -> tandem stance with eyes closed * toe taps alternating -1st / 2nd step, without UE support   *hamstring stretch R/L x 15 sec x 2;  R/L hip flexor stretch 15 sec each    09/28/22: Therapeutic exercise:  *Recumbent bike (lifetime fitness) L1 x 2.5 min for warm up *Side stepping R/L with UE on rail with increased step height  * tandem gait forward / backward with intermittent UE support on rail * Rt single heel raises x 5 * NuStep, LE only, L3 x 4 min  * stairs -reciprocal pattern with BUE on rails x 12 up/down * alternating slow toe taps to 7" step x 10 without UE support * R/L/R quad stretch with foot on 7" step * reviewed sit to/from supine via log roll  * Lt sidelying:  Rt clams x 10, reverse - alternating  ER/IR x 10 * 5 bridges   09/25/22 Re-assess/cert Functional testing Foto  09/20/22 Pt seen for aquatic therapy today.  Treatment took place in water 3.5-4.75 ft in depth at the [facility] pool. Temp of water was 91.  Pt entered/exited the pool via stairs independently in step-to pattern with bilat rail.  * walking unsupported forward/ backward x 5 laps with cues for increased step height of RLE * tandem gait forward/ backward, hands out of water 2 laps  * monster walk forward/ backward * farmer carry with solid noodle held at side, with forward/backward marching  * TrA set with solid noodle pull down to thighs, isometric holds on return to surface x 10 * UE on solid noodle:  leg swings into hip flex/ ext x 10; single leg heel raises x 10 each;  * SLS with noodle stomp (solid noodle), no UE support x 10 slow *STS 3rd step x 5, 4th step x 5, slow eccentric lowering to step   8/11  Nustep warm up 5 min, Lvl 3  Prone quad stretch with strap 30s 4x  Supine piriformis stretch 30s 4x Bridge with GTB at knees 3x8 Standing hip flexor stretch 30s 4x  8" box step up with UE 3x8   PATIENT EDUCATION:  Education details:exercise  form/rationale Person educated: Patient Education method: Explanation, Demonstration, Tactile cues, Verbal cues,  Education comprehension: verbalized understanding, returned demonstration, verbal cues required, tactile cues required, and needs further education  HOME EXERCISE PROGRAM: Access Code: [email or web address] URL: [email or web address] Date: 09/28/2022 Prepared by: [facility] [address]  Exercises - Supine Piriformis Stretch with Foot on Ground  - 2 x daily - 7 x weekly - 1 sets - 3 reps - 30 hold - Supine Bridge with Mini Swiss Ball Between Knees  - 1 x daily - 7 x weekly - 2 sets - 10 reps - 3s hold - Step Up  - 1 x daily - 7 x weekly - 2 sets - 10 reps - Quadricep Stretch with Chair and Counter Support  - 1 x daily  - 7 x weekly - 2-3 reps - 20 seconds  hold - Side Stepping with Counter Support  - 1 x daily - 7 x weekly - 2 sets - 10 reps - Standing Toe Taps  - 1 x daily - 7 x weekly - 1-2 sets - 10 reps   ASSESSMENT:  CLINICAL IMPRESSION: Pt tolerated all exercises in pool well, without production of symptoms.  Pt to be issued laminated HEP for pool at next land visit.   She will continue to benefit from skilled physical therapy intervention for 4 more weeks land based only as a progression in order to reach goals and maximize functional R LE strength and ROM.   OBJECTIVE IMPAIRMENTS: Abnormal gait, decreased activity tolerance, decreased endurance, difficulty walking, decreased ROM, decreased strength, increased muscle spasms, improper body mechanics, postural dysfunction, and pain.   ACTIVITY LIMITATIONS: carrying, lifting, bending, sitting, squatting, stairs, transfers, bed mobility, bathing, and locomotion level  PARTICIPATION LIMITATIONS: meal prep, cleaning, laundry, shopping, community activity, occupation, yard work, and church  PERSONAL FACTORS: see PMH  REHAB POTENTIAL: Good  CLINICAL DECISION MAKING: Evolving/moderate complexity  EVALUATION COMPLEXITY: Moderate   GOALS: Goals reviewed with patient? Yes  SHORT TERM GOALS: Target date: 7/26  Able to demo proper hip ext in toe off phase of gait Baseline: Goal status: Met - 09/06/22   LONG TERM GOALS: Target date: 11/09/22  Meet FOTO goal Baseline:  Goal status: Met 09/14/22  2.  Find location to continue aquatic HEP Baseline:  Goal status: in progress 10/02/22  3.  5TSTS to 20s or less Baseline:  Goal status: Met - 09/06/22  4.  TUG to 13.5s to indicate low fall risk and proper functional ability Baseline:  Goal status: in progress (14.51 09/25/22)   5. 3 minute walk test to 425 or > ft to demonstrate improved toleration to amb and level of mobility.    Baseline: 325    Goal Status: New    6. Pt will amb without AD  safety    Baseline: walking stick    Goal status: New    7. Pt will be indep with final HEP's (land and aquatic as appropriate) for continued management of condition     Baseline: aquatic to be assigned    Goal Status: New  8. Pt will improve strength in R hip flex to at least 5lb from contralateral side to demonstrate improved overall   physical function   Baseline: see chart  Goal Status: New   PLAN:  PT FREQUENCY: 1-2x/week  PT DURATION: 6 weeks 8 visits  PLANNED INTERVENTIONS: Therapeutic exercises, Therapeutic activity, Neuromuscular re-education, Balance training, Gait training, Patient/Family education, Self Care, Joint mobilization, Stair training, Aquatic Therapy,  Dry Needling, Electrical stimulation, Spinal mobilization, Cryotherapy, Moist heat, Taping, Traction, Ionotophoresis 4mg /ml Dexamethasone, Manual therapy, and Re-evaluation.  PLAN FOR NEXT SESSION: Land: progress hip strengthening and ROM. Gait training, amb toleration    Mayer Camel, PTA 10/02/22 3:00 PM [facility] [address] [place], [place], [address] Phone: [phone]   Fax:  [phone]     Referring diagnosis? R26.2 M15.9 Treatment diagnosis? (if different than referring diagnosis) M25.551 M62.81 R26.89 What was this (referring dx) caused by? [x]  Surgery []  Fall []  Ongoing issue []  Arthritis []  Other: ____________  Laterality: [x]  Rt []  Lt []  Both  Check all possible CPT codes:  *CHOOSE 10 OR LESS*    []  97110 (Therapeutic Exercise)  []  92507 (SLP Treatment)  []  97112 (Neuro Re-ed)   []  92526 (Swallowing Treatment)   []  97116 (Gait Training)   []  [id number] (Cognitive Training, 1st 15 minutes) []  97140 (Manual Therapy)   []  97130 (Cognitive Training, each add'l 15 minutes)  []  97164 (Re-evaluation)                              []  Other, List CPT Code ____________  []  97530 (Therapeutic Activities)     []  97535 (Self  Care)   [x]  All codes above (97110 - 97535)  []  97012 (Mechanical Traction)  [x]  97014 (E-stim Unattended)  []  95284 (E-stim manual)  [x]  97033 (Ionto)  [x]  97035 (Ultrasound) [x]  97750 (Physical Performance Training) [x]  [id number] (Aquatic Therapy) []  97016 (Vasopneumatic Device) []  [id number] (Paraffin) []  97034 (Contrast Bath) []  97597 (Wound Care 1st 20 sq cm) []  97598 (Wound Care each add'l 20 sq cm) []  97760 (Orthotic Fabrication, Fitting, Training Initial) []  [id number] (Prosthetic Management and Training Initial) []  [id number] (Orthotic or Prosthetic Training/ Modification Subsequent)

## 2022-10-02 NOTE — Telephone Encounter (Signed)
Was this recommended for the night time urination? If so, we will need to discuss this in office at her next appointment as I will need to determine if there are other symptoms that could be coming from sleep apnea. I think it would be hard to get insurance to cover a sleep study just for increased night time urination.

## 2022-10-03 NOTE — Telephone Encounter (Signed)
Noted  

## 2022-10-04 ENCOUNTER — Ambulatory Visit (HOSPITAL_BASED_OUTPATIENT_CLINIC_OR_DEPARTMENT_OTHER): Payer: Medicare PPO | Admitting: Physical Therapy

## 2022-10-04 DIAGNOSIS — R2689 Other abnormalities of gait and mobility: Secondary | ICD-10-CM | POA: Diagnosis not present

## 2022-10-04 DIAGNOSIS — M5459 Other low back pain: Secondary | ICD-10-CM

## 2022-10-04 DIAGNOSIS — M25551 Pain in right hip: Secondary | ICD-10-CM | POA: Diagnosis not present

## 2022-10-04 DIAGNOSIS — M6281 Muscle weakness (generalized): Secondary | ICD-10-CM | POA: Diagnosis not present

## 2022-10-04 NOTE — Therapy (Signed)
OUTPATIENT PHYSICAL THERAPY TREATMENT   Patient Name: Wanda Lang MRN: [id number] DOB:[birth year], 72 y.o., female Today's Date: 10/04/2022  END OF SESSION:  PT End of Session - 10/04/22 1515     Visit Number 12    Number of Visits 17    Date for PT Re-Evaluation 11/09/22    Authorization Type human[employer] MCR    Progress Note Due on Visit 19    PT Start Time 1403    PT Stop Time 1441    PT Time Calculation (min) 38 min    Activity Tolerance Patient tolerated treatment well    Behavior During Therapy WFL for tasks assessed/performed                 Past Medical History:  Diagnosis Date   Allergy    Arthritis    Coronary artery disease    Fatty liver    GERD (gastroesophageal reflux disease)    Heart murmur    Hypertension    Hx   LIVER FUNCTION TESTS, ABNORMAL, HX OF 09/08/2007   Qualifier: Diagnosis of  By: Nelson-Smith CMA (AAMA), Dottie     Neuromuscular disorder (HCC)    Essential Tremor   Overactive bladder    Past Surgical History:  Procedure Laterality Date   ABDOMINAL HYSTERECTOMY     COLONOSCOPY WITH PROPOFOL N/A 09/04/2018   Procedure: COLONOSCOPY WITH PROPOFOL;  Surgeon: Pasty Spillers, MD;  Location: [facility];  Service: Endoscopy;  Laterality: N/A;   ESOPHAGOGASTRODUODENOSCOPY (EGD) WITH PROPOFOL N/A 09/04/2018   Procedure: ESOPHAGOGASTRODUODENOSCOPY (EGD) WITH PROPOFOL;  Surgeon: Pasty Spillers, MD;  Location: [facility] END[facility];  Service: Endoscopy;  Laterality: N/A;   LAPAROSCOPY     for endometriosis x2   TONSILLECTOMY     TOTAL HIP ARTHROPLASTY Right 05/15/2022   Procedure: RIGHT TOTAL HIP ARTHROPLASTY ANTERIOR APPROACH;  Surgeon: Kathryne Hitch, MD;  Location: [facility];  Service: Orthopedics;  Laterality: Right;   TOTAL KNEE ARTHROPLASTY Left 07/18/2021   Procedure: TOTAL KNEE ARTHROPLASTY;  Surgeon: Durene Romans, MD;  Location: [facility];  Service: Orthopedics;  Laterality: Left;   TURBINATE REDUCTION     Patient Active  Problem List   Diagnosis Date Noted   S/P total hip arthroplasty 05/17/2022   Status post total replacement of right hip 05/15/2022   Overactive bladder 04/04/2022   Postmenopausal estrogen deficiency 11/13/2021   Primary osteoarthritis 11/13/2021   Leg length inequality 11/06/2021   Impaired ambulation 10/23/2021   Contact dermatitis 10/16/2021   SI joint arthritis 08/24/2021   Spinal stenosis, lumbar region, without neurogenic claudication 08/24/2021   Degeneration of lumbar intervertebral disc 07/24/2021   S/P total knee arthroplasty, left 07/18/2021   Preop examination 07/07/2021   Right hip pain 07/07/2021   Low back pain 07/07/2021   Drug-induced immunodeficiency (HCC) 06/12/2021   Impingement syndrome of right shoulder region 06/12/2021   COVID-19 01/24/2021   Heart murmur 11/24/2020   Hammer toe 04/22/2020   Metatarsalgia of left foot 04/22/2020   Osteoarthritis of midfoot 04/22/2020   Duodenal ulcer 11/12/2019   Stress 11/12/2019   Morbid obesity (HCC) 11/06/2019   Pain in left knee 07/30/2019   Aortic stenosis, moderate 02/06/2018   Maxillary sinusitis, chronic 10/24/2017   Chronic diarrhea 10/09/2017   Acid reflux 07/15/2017   Asymptomatic carotid artery stenosis 07/15/2017   Cyst of skin 07/15/2017   Immunocompromised (HCC) 11/09/2016   Allergic rhinitis 11/02/2016   Benign essential tremor 11/02/2016   History of hypertension 11/02/2016   Postmenopausal symptoms  07/01/2015   Seasonal allergic rhinitis due to pollen 07/01/2015   Fatty liver 09/08/2007   Rheumatoid arthritis (HCC) 09/08/2007    REFERRING PROVIDER:   Kathryne Hitch, MD    REFERRING DIAG:  R26.2 (ICD-10-CM) - Impaired ambulation  M15.9 (ICD-10-CM) - Primary osteoarthritis involving multiple joints  S/p Rt THA 05/15/22  Rationale for Evaluation and Treatment: Rehabilitation  THERAPY DIAG:  No diagnosis found.  ONSET DATE: Rt THA 05/15/22, deconditioned prior to  surgery   SUBJECTIVE:                                                                                                                                                                                           SUBJECTIVE STATEMENT: Pt reports that she was a little sore after last session. She hasn't join the [employer].  She took tylenol prior to session.    PERTINENT HISTORY:  LLD noted prior to surgery & unable to fully correct  PAIN:  Are you having pain? yes : NPRS scale: 2-3/10  Pain location: Rt hip / thigh/ groin  Pain description:  Aggravating factors: SLS Relieving factors: knee flexed toes turned in  PRECAUTIONS:  Fall  WEIGHT BEARING RESTRICTIONS:  No  FALLS:  Has patient fallen in last 6 months? No  LIVING ENVIRONMENT: Lives with: lives alone Lives in: House/apartment Stairs: yes, also has stair lift Has following equipment at home:  walking stick   PLOF:  Independent  PATIENT GOALS:  Decrease pain, day to day stuff   OBJECTIVE:    LOWER EXTREMITY MMT:    MMT Right 09/25/22 Left 09/25/22  Hip flexion 34.2 53.7  Hip extension    Hip abduction 35.4 40.5  Hip adduction    Hip internal rotation    Hip external rotation    Knee flexion    Knee extension 39.0 43.9  Ankle dorsiflexion    Ankle plantarflexion    Ankle inversion    Ankle eversion     (Blank rows = not tested)  PATIENT SURVEYS:  FOTO 38 09/14/22:44% 09/25/22: 49%  COGNITIVE STATUS: Within functional limits for tasks assessed   RED FLAGS: None    SENSATION: WFL  POSTURE:  Eval: decr lumbar lordosis in standing, slight forward flexion, weight shifted to the Lt  GAIT: EVAL: antalgic gait with walking stick, +Rt trendelenburg   Body Part #1 Hip  PALPATION: EVAL: limited mobility in Rt SIJ; notable anterior rotation of Rt innominate concordant pain in trigger points in Rt gluts, piriformis    FUNCTIONAL TESTS:  Timed up and go (TUG): NT at eval due to pain 3 minute walk  test: NT at  eval 30s chair stand test   09/06/22: 5x STS= 18.36sec (from hot tub surround) 09/25/22: TUG=14.51     5x STS=1404     3 min walk test=311ft (around perimeter of pool on deck in pool shoes) using walking stick      TODAY'S TREATMENT:       10/04/22:  Therapeutic exercise: NuStep L4: UE/LE x 5  LF: Leg press, 10# x 5, 20# x 5; 10# x 10 LF: seated hip addct, 40# x 10- 2 sets LF: seated hip abdct, 40# x 10 - 3 sets LF: seated hamstring curls, 10# x 20 LF: knee ext 10# x 8 reps  Toe taps to 1st step without UE -> light touch (1 finger each UE) on rail  x 8 each side  R/L/R quad stretch with foot on 2nd step behind her x 20s each  10/02/22 Pt seen for aquatic therapy today.  Treatment took place in water 3.5-4.75 ft in depth at the [facility] pool. Temp of water was 91.  Pt entered/exited the pool via stairs independently in step-to pattern with bilat rail. * farmer carry with single/ bilat yellow hand floats held at side, with forward/backward/ sideways marching  * side stepping -> side squat with arm addct with yellow hand floats  * 3 way LE kick, UE on yellow hand floats 3 sets of 5 * TrA set with solid noodle pull down to thighs, isometric holds on return to surface x 10 * SLS with noodle stomp (hollow noodle), no UE support x 10 slow, 10 fast -> yellow noodle  * standing balance on yellow noodle, marching (good challenge)  * seated balance on noodle with single hand lifts (challenge)  * monster walk forward/ backward * tandem gait -> tandem stance with eyes closed * toe taps alternating -1st / 2nd step, without UE support   *hamstring stretch R/L x 15 sec x 2;  R/L hip flexor stretch 15 sec each    09/28/22: Therapeutic exercise:  *Recumbent bike (lifetime fitness) L1 x 2.5 min for warm up *Side stepping R/L with UE on rail with increased step height  * tandem gait forward / backward with intermittent UE support on rail * Rt single heel raises x 5 *  NuStep, LE only, L3 x 4 min  * stairs -reciprocal pattern with BUE on rails x 12 up/down * alternating slow toe taps to 7" step x 10 without UE support * R/L/R quad stretch with foot on 7" step * reviewed sit to/from supine via log roll  * Lt sidelying:  Rt clams x 10, reverse - alternating ER/IR x 10 * 5 bridges   09/25/22 Re-assess/cert Functional testing Foto  09/20/22 Pt seen for aquatic therapy today.  Treatment took place in water 3.5-4.75 ft in depth at the [facility] pool. Temp of water was 91.  Pt entered/exited the pool via stairs independently in step-to pattern with bilat rail.  * walking unsupported forward/ backward x 5 laps with cues for increased step height of RLE * tandem gait forward/ backward, hands out of water 2 laps  * monster walk forward/ backward * farmer carry with solid noodle held at side, with forward/backward marching  * TrA set with solid noodle pull down to thighs, isometric holds on return to surface x 10 * UE on solid noodle:  leg swings into hip flex/ ext x 10; single leg heel raises x 10 each;  * SLS with noodle stomp (solid noodle), no UE support x  10 slow *STS 3rd step x 5, 4th step x 5, slow eccentric lowering to step   8/11  Nustep warm up 5 min, Lvl 3  Prone quad stretch with strap 30s 4x  Supine piriformis stretch 30s 4x Bridge with GTB at knees 3x8 Standing hip flexor stretch 30s 4x  8" box step up with UE 3x8   PATIENT EDUCATION:  Education details:exercise form/rationale Person educated: Patient Education method: Explanation, Demonstration, Tactile cues, Verbal cues,  Education comprehension: verbalized understanding, returned demonstration, verbal cues required, tactile cues required, and needs further education  HOME EXERCISE PROGRAM: Access Code: [email or web address] URL: [email or web address] Date: 09/28/2022 Prepared by: [facility] [address]  Exercises - Supine Piriformis Stretch with  Foot on Ground  - 2 x daily - 7 x weekly - 1 sets - 3 reps - 30 hold - Supine Bridge with Mini Swiss Ball Between Knees  - 1 x daily - 7 x weekly - 2 sets - 10 reps - 3s hold - Step Up  - 1 x daily - 7 x weekly - 2 sets - 10 reps - Quadricep Stretch with Chair and Counter Support  - 1 x daily - 7 x weekly - 2-3 reps - 20 seconds  hold - Side Stepping with Counter Support  - 1 x daily - 7 x weekly - 2 sets - 10 reps - Standing Toe Taps  - 1 x daily - 7 x weekly - 1-2 sets - 10 reps   ASSESSMENT:  CLINICAL IMPRESSION: Pt completed more gym based exercises (with good tolerance) in preparation of transitioning to independent HEP at [facility].  She was issued laminated HEP for pool today; reviewed verbally. She would benefit from continued balance exercises, including hurdles and transitioning over small obstacles.   She will continue to benefit from skilled physical therapy intervention for 3 more weeks land based only as a progression in order to reach goals and maximize functional R LE strength and ROM.   OBJECTIVE IMPAIRMENTS: Abnormal gait, decreased activity tolerance, decreased endurance, difficulty walking, decreased ROM, decreased strength, increased muscle spasms, improper body mechanics, postural dysfunction, and pain.   ACTIVITY LIMITATIONS: carrying, lifting, bending, sitting, squatting, stairs, transfers, bed mobility, bathing, and locomotion level  PARTICIPATION LIMITATIONS: meal prep, cleaning, laundry, shopping, community activity, occupation, yard work, and church  PERSONAL FACTORS: see PMH  REHAB POTENTIAL: Good  CLINICAL DECISION MAKING: Evolving/moderate complexity  EVALUATION COMPLEXITY: Moderate   GOALS: Goals reviewed with patient? Yes  SHORT TERM GOALS: Target date: 7/26  Able to demo proper hip ext in toe off phase of gait Baseline: Goal status: Met - 09/06/22   LONG TERM GOALS: Target date: 11/09/22  Meet FOTO goal Baseline:  Goal status: Met 09/14/22  2.   Find location to continue aquatic HEP Baseline:  Goal status: in progress 10/02/22  3.  5TSTS to 20s or less Baseline:  Goal status: Met - 09/06/22  4.  TUG to 13.5s to indicate low fall risk and proper functional ability Baseline:  Goal status: in progress (14.51 09/25/22)   5. 3 minute walk test to 425 or > ft to demonstrate improved toleration to amb and level of mobility.    Baseline: 325    Goal Status: New    6. Pt will amb without AD safety    Baseline: walking stick    Goal status: New    7. Pt will be indep with final HEP's (land and aquatic as appropriate) for continued management of  condition     Baseline: aquatic to be assigned    Goal Status: New  8. Pt will improve strength in R hip flex to at least 5lb from contralateral side to demonstrate improved overall   physical function   Baseline: see chart  Goal Status: New   PLAN:  PT FREQUENCY: 1-2x/week  PT DURATION: 6 weeks 8 visits  PLANNED INTERVENTIONS: Therapeutic exercises, Therapeutic activity, Neuromuscular re-education, Balance training, Gait training, Patient/Family education, Self Care, Joint mobilization, Stair training, Aquatic Therapy, Dry Needling, Electrical stimulation, Spinal mobilization, Cryotherapy, Moist heat, Taping, Traction, Ionotophoresis 4mg /ml Dexamethasone, Manual therapy, and Re-evaluation.  PLAN FOR NEXT SESSION: Land: progress hip strengthening and ROM. Gait training, amb toleration    Mayer Camel, PTA 10/04/22 3:25 PM [facility] [address] [place], [place], [address] Phone: [phone]   Fax:  [phone]      Referring diagnosis? R26.2 M15.9 Treatment diagnosis? (if different than referring diagnosis) M25.551 M62.81 R26.89 What was this (referring dx) caused by? [x]  Surgery []  Fall []  Ongoing issue []  Arthritis []  Other: ____________  Laterality: [x]  Rt []  Lt []  Both  Check all possible CPT  codes:  *CHOOSE 10 OR LESS*    []  97110 (Therapeutic Exercise)  []  92507 (SLP Treatment)  []  97112 (Neuro Re-ed)   []  92526 (Swallowing Treatment)   []  97116 (Gait Training)   []  [id number] (Cognitive Training, 1st 15 minutes) []  97140 (Manual Therapy)   []  97130 (Cognitive Training, each add'l 15 minutes)  []  97164 (Re-evaluation)                              []  Other, List CPT Code ____________  []  97530 (Therapeutic Activities)     []  97535 (Self Care)   [x]  All codes above (97110 - 97535)  []  97012 (Mechanical Traction)  [x]  97014 (E-stim Unattended)  []  97032 (E-stim manual)  [x]  97033 (Ionto)  [x]  97035 (Ultrasound) [x]  97750 (Physical Performance Training) [x]  [id number] (Aquatic Therapy) []  97016 (Vasopneumatic Device) []  [id number] (Paraffin) []  97034 (Contrast Bath) []  97597 (Wound Care 1st 20 sq cm) []  97598 (Wound Care each add'l 20 sq cm) []  97760 (Orthotic Fabrication, Fitting, Training Initial) []  25366 (Prosthetic Management and Training Initial) []  [id number] (Orthotic or Prosthetic Training/ Modification Subsequent)

## 2022-10-05 ENCOUNTER — Ambulatory Visit: Payer: Medicare PPO | Admitting: Family Medicine

## 2022-10-05 ENCOUNTER — Encounter: Payer: Self-pay | Admitting: Family Medicine

## 2022-10-05 VITALS — BP 122/76 | HR 81 | Temp 98.2°F | Ht 64.5 in | Wt 206.8 lb

## 2022-10-05 DIAGNOSIS — R351 Nocturia: Secondary | ICD-10-CM

## 2022-10-05 DIAGNOSIS — G4710 Hypersomnia, unspecified: Secondary | ICD-10-CM

## 2022-10-05 DIAGNOSIS — N3281 Overactive bladder: Secondary | ICD-10-CM | POA: Diagnosis not present

## 2022-10-05 LAB — POCT GLYCOSYLATED HEMOGLOBIN (HGB A1C): Hemoglobin A1C: 5.5 % (ref 4.0–5.6)

## 2022-10-05 NOTE — Progress Notes (Signed)
Marikay Alar, MD Phone: [phone]  Wanda Lang is a 72 y.o. female who presents today for follow-up.  Overactive bladder/nocturia: Patient saw urology.  They started her on oxybutynin and she notes has been on this for about a week.  She is unsure if it is beneficial.  She gets up 3 times at night to urinate.  She has frequency and urgency during the day as well.  Urology mention getting a sleep study to evaluate for undiagnosed sleep apnea.  She does report hypersomnia and napping during the day.  Epworth Sleepiness Scale score of 17 with individual scores of 3, 3, 2, 3, 3, 1, 2, 0  Social History   Tobacco Use  Smoking Status Never  Smokeless Tobacco Never    Current Outpatient Medications on File Prior to Visit  Medication Sig Dispense Refill   acetaminophen (TYLENOL) 500 MG tablet Take 1,000 mg by mouth every 6 (six) hours as needed for moderate pain.     b complex vitamins capsule Take 1 capsule by mouth daily.     CALCIUM PO Take 1,200 mg by mouth daily.     celecoxib (CELEBREX) 200 MG capsule Take 200 mg by mouth daily.     cycloSPORINE (RESTASIS) 0.05 % ophthalmic emulsion Place 1 drop into both eyes 2 (two) times daily.     estradiol (VIVELLE-DOT) 0.05 MG/24HR patch Place 1 patch onto the skin 2 (two) times a week.     Flaxseed, Linseed, (FLAXSEED OIL) 1200 MG CAPS Take 1,200 mg by mouth daily.     Glucosamine HCl 1000 MG TABS Take 1,000 mg by mouth daily.     Magnesium 100 MG TABS Take 100 mg by mouth 2 (two) times a week.     Multiple Vitamin (MULTIVITAMIN WITH MINERALS) TABS tablet Take 1 tablet by mouth daily.     oxybutynin (DITROPAN XL) 15 MG 24 hr tablet Take 1 tablet (15 mg total) by mouth daily. 30 tablet 5   Probiotic Product (PROBIOTIC DAILY PO) Take 1 Dose by mouth daily.     trolamine salicylate (BLUE-EMU HEMP) 10 % cream Apply 1 Application topically as needed for muscle pain.     Vitamin D, Ergocalciferol, (DRISDOL) 1.25 MG (50000 UNIT) CAPS capsule  Take 50,000 Units by mouth every 14 (fourteen) days.     No current facility-administered medications on file prior to visit.     ROS see history of present illness  Objective  Physical Exam Vitals:   10/05/22 0824  BP: 122/76  Pulse: 81  Temp: 98.2 F (36.8 C)  SpO2: 98%    BP Readings from Last 3 Encounters:  10/05/22 122/76  09/25/22 (!) 144/82  07/19/22 122/78   Wt Readings from Last 3 Encounters:  10/05/22 206 lb 12.8 oz (93.8 kg)  09/25/22 207 lb 2 oz (94 kg)  07/19/22 191 lb (86.6 kg)    Physical Exam Constitutional:      General: She is not in acute distress. Neurological:     Mental Status: She is alert.      Assessment/Plan: Please see individual problem list.  Overactive bladder Assessment & Plan: Chronic issue.  Continue oxybutynin 15 mg daily.  Follow-up with urology as planned.   Nocturia Assessment & Plan: Chronic issue.  Possibly related to undiagnosed sleep apnea or overactive bladder.  She will continue oxybutynin 15 mg daily and follow-up with urology as planned.  Will also check an A1c today.  Orders: -     POCT glycosylated hemoglobin (Hb  A1C)  Hypersomnia Assessment & Plan: Chronic issue.  Going on more than a year.  Plan for home sleep study.  Orders: -     PSG SLEEP STUDY; Future    Return in about 4 months (around 02/04/2023).   Marikay Alar, MD [facility]

## 2022-10-05 NOTE — Assessment & Plan Note (Signed)
Chronic issue.  Continue oxybutynin 15 mg daily.  Follow-up with urology as planned.

## 2022-10-05 NOTE — Assessment & Plan Note (Signed)
Chronic issue.  Going on more than a year.  Plan for home sleep study.

## 2022-10-05 NOTE — Assessment & Plan Note (Signed)
Chronic issue.  Possibly related to undiagnosed sleep apnea or overactive bladder.  She will continue oxybutynin 15 mg daily and follow-up with urology as planned.  Will also check an A1c today.

## 2022-10-10 ENCOUNTER — Ambulatory Visit (HOSPITAL_BASED_OUTPATIENT_CLINIC_OR_DEPARTMENT_OTHER): Payer: Medicare PPO | Attending: Physical Medicine & Rehabilitation | Admitting: Physical Therapy

## 2022-10-10 ENCOUNTER — Encounter (HOSPITAL_BASED_OUTPATIENT_CLINIC_OR_DEPARTMENT_OTHER): Payer: Self-pay | Admitting: Physical Therapy

## 2022-10-10 DIAGNOSIS — R2689 Other abnormalities of gait and mobility: Secondary | ICD-10-CM | POA: Insufficient documentation

## 2022-10-10 DIAGNOSIS — M25551 Pain in right hip: Secondary | ICD-10-CM | POA: Insufficient documentation

## 2022-10-10 DIAGNOSIS — M6281 Muscle weakness (generalized): Secondary | ICD-10-CM | POA: Insufficient documentation

## 2022-10-10 DIAGNOSIS — M5459 Other low back pain: Secondary | ICD-10-CM | POA: Insufficient documentation

## 2022-10-10 NOTE — Therapy (Signed)
OUTPATIENT PHYSICAL THERAPY TREATMENT   Patient Name: Wanda Lang MRN: [id number] DOB:September 06, 1950, 72 y.o., female Today's Date: 10/10/2022  END OF SESSION:  PT End of Session - 10/10/22 1347     Visit Number 13    Number of Visits 17    Date for PT Re-Evaluation 11/09/22    Authorization Type human[employer] MCR    Progress Note Due on Visit 19    PT Start Time 1346    PT Stop Time 1426    PT Time Calculation (min) 40 min    Activity Tolerance Patient tolerated treatment well    Behavior During Therapy WFL for tasks assessed/performed                 Past Medical History:  Diagnosis Date   Allergy    Arthritis    Coronary artery disease    Fatty liver    GERD (gastroesophageal reflux disease)    Heart murmur    Hypertension    Hx   LIVER FUNCTION TESTS, ABNORMAL, HX OF 09/08/2007   Qualifier: Diagnosis of  By: Nelson-Smith CMA (AAMA), Dottie     Neuromuscular disorder (HCC)    Essential Tremor   Overactive bladder    Past Surgical History:  Procedure Laterality Date   ABDOMINAL HYSTERECTOMY     COLONOSCOPY WITH PROPOFOL N/A 09/04/2018   Procedure: COLONOSCOPY WITH PROPOFOL;  Surgeon: Pasty Spillers, MD;  Location: [facility];  Service: Endoscopy;  Laterality: N/A;   ESOPHAGOGASTRODUODENOSCOPY (EGD) WITH PROPOFOL N/A 09/04/2018   Procedure: ESOPHAGOGASTRODUODENOSCOPY (EGD) WITH PROPOFOL;  Surgeon: Pasty Spillers, MD;  Location: [facility];  Service: Endoscopy;  Laterality: N/A;   LAPAROSCOPY     for endometriosis x2   TONSILLECTOMY     TOTAL HIP ARTHROPLASTY Right 05/15/2022   Procedure: RIGHT TOTAL HIP ARTHROPLASTY ANTERIOR APPROACH;  Surgeon: Kathryne Hitch, MD;  Location: [facility];  Service: Orthopedics;  Laterality: Right;   TOTAL KNEE ARTHROPLASTY Left 07/18/2021   Procedure: TOTAL KNEE ARTHROPLASTY;  Surgeon: Durene Romans, MD;  Location: [facility];  Service: Orthopedics;  Laterality: Left;   TURBINATE REDUCTION     Patient Active  Problem List   Diagnosis Date Noted   Nocturia 10/05/2022   Hypersomnia 10/05/2022   S/P total hip arthroplasty 05/17/2022   Status post total replacement of right hip 05/15/2022   Overactive bladder 04/04/2022   Postmenopausal estrogen deficiency 11/13/2021   Primary osteoarthritis 11/13/2021   Leg length inequality 11/06/2021   Impaired ambulation 10/23/2021   Contact dermatitis 10/16/2021   SI joint arthritis 08/24/2021   Spinal stenosis, lumbar region, without neurogenic claudication 08/24/2021   Degeneration of lumbar intervertebral disc 07/24/2021   S/P total knee arthroplasty, left 07/18/2021   Preop examination 07/07/2021   Right hip pain 07/07/2021   Low back pain 07/07/2021   Drug-induced immunodeficiency (HCC) 06/12/2021   Impingement syndrome of right shoulder region 06/12/2021   COVID-19 01/24/2021   Heart murmur 11/24/2020   Hammer toe 04/22/2020   Metatarsalgia of left foot 04/22/2020   Osteoarthritis of midfoot 04/22/2020   Duodenal ulcer 11/12/2019   Stress 11/12/2019   Morbid obesity (HCC) 11/06/2019   Pain in left knee 07/30/2019   Aortic stenosis, moderate 02/06/2018   Maxillary sinusitis, chronic 10/24/2017   Chronic diarrhea 10/09/2017   Acid reflux 07/15/2017   Asymptomatic carotid artery stenosis 07/15/2017   Cyst of skin 07/15/2017   Immunocompromised (HCC) 11/09/2016   Allergic rhinitis 11/02/2016   Benign essential tremor 11/02/2016  History of hypertension 11/02/2016   Postmenopausal symptoms 07/01/2015   Seasonal allergic rhinitis due to pollen 07/01/2015   Fatty liver 09/08/2007   Rheumatoid arthritis (HCC) 09/08/2007    REFERRING PROVIDER:   Kathryne Hitch, MD    REFERRING DIAG:  R26.2 (ICD-10-CM) - Impaired ambulation  M15.9 (ICD-10-CM) - Primary osteoarthritis involving multiple joints  S/p Rt THA 05/15/22  Rationale for Evaluation and Treatment: Rehabilitation  THERAPY DIAG:  Muscle weakness (generalized)  Other  abnormalities of gait and mobility  Other low back pain  Pain in right hip  ONSET DATE: Rt THA 05/15/22, deconditioned prior to surgery   SUBJECTIVE:                                                                                                                                                                                           SUBJECTIVE STATEMENT: Pt reports she is doing good. Still feeling weakness.    PERTINENT HISTORY:  LLD noted prior to surgery & unable to fully correct  PAIN:  Are you having pain? yes : NPRS scale: 2-3/10  Pain location: Rt hip / thigh/ groin  Pain description:  Aggravating factors: SLS Relieving factors: knee flexed toes turned in  PRECAUTIONS:  Fall  WEIGHT BEARING RESTRICTIONS:  No  FALLS:  Has patient fallen in last 6 months? No  LIVING ENVIRONMENT: Lives with: lives alone Lives in: House/apartment Stairs: yes, also has stair lift Has following equipment at home:  walking stick   PLOF:  Independent  PATIENT GOALS:  Decrease pain, day to day stuff   OBJECTIVE:    LOWER EXTREMITY MMT:    MMT Right 09/25/22 Left 09/25/22  Hip flexion 34.2 53.7  Hip extension    Hip abduction 35.4 40.5  Hip adduction    Hip internal rotation    Hip external rotation    Knee flexion    Knee extension 39.0 43.9  Ankle dorsiflexion    Ankle plantarflexion    Ankle inversion    Ankle eversion     (Blank rows = not tested)  PATIENT SURVEYS:  FOTO 38 09/14/22:44% 09/25/22: 49%  COGNITIVE STATUS: Within functional limits for tasks assessed   RED FLAGS: None    SENSATION: WFL  POSTURE:  Eval: decr lumbar lordosis in standing, slight forward flexion, weight shifted to the Lt  GAIT: EVAL: antalgic gait with walking stick, +Rt trendelenburg   Body Part #1 Hip  PALPATION: EVAL: limited mobility in Rt SIJ; notable anterior rotation of Rt innominate concordant pain in trigger points in Rt gluts, piriformis    FUNCTIONAL TESTS:   Timed up and go (TUG): NT  at eval due to pain 3 minute walk test: NT at eval 30s chair stand test   09/06/22: 5x STS= 18.36sec (from hot tub surround) 09/25/22: TUG=14.51     5x STS=1404     3 min walk test=316ft (around perimeter of pool on deck in pool shoes) using walking stick      TODAY'S TREATMENT:       10/10/22 Step up 6 inch 2 x 10 bilateral Lateral step down 4 inch 2 x 10 bilateral LF: seated hip addct, 40# x 10- 3 sets LF: seated hip abdct, 45# x 10 - 2 sets, 55# 1 x 10 LF: seated hamstring curls, 10# 2x 15 LF: Leg press, 20# 3 x 10   10/04/22:  Therapeutic exercise: NuStep L4: UE/LE x 5  LF: Leg press, 10# x 5, 20# x 5; 10# x 10 LF: seated hip addct, 40# x 10- 2 sets LF: seated hip abdct, 40# x 10 - 3 sets LF: seated hamstring curls, 10# x 20 LF: knee ext 10# x 8 reps  Toe taps to 1st step without UE -> light touch (1 finger each UE) on rail  x 8 each side  R/L/R quad stretch with foot on 2nd step behind her x 20s each  10/02/22 Pt seen for aquatic therapy today.  Treatment took place in water 3.5-4.75 ft in depth at the [facility] pool. Temp of water was 91.  Pt entered/exited the pool via stairs independently in step-to pattern with bilat rail. * farmer carry with single/ bilat yellow hand floats held at side, with forward/backward/ sideways marching  * side stepping -> side squat with arm addct with yellow hand floats  * 3 way LE kick, UE on yellow hand floats 3 sets of 5 * TrA set with solid noodle pull down to thighs, isometric holds on return to surface x 10 * SLS with noodle stomp (hollow noodle), no UE support x 10 slow, 10 fast -> yellow noodle  * standing balance on yellow noodle, marching (good challenge)  * seated balance on noodle with single hand lifts (challenge)  * monster walk forward/ backward * tandem gait -> tandem stance with eyes closed * toe taps alternating -1st / 2nd step, without UE support   *hamstring stretch R/L x 15 sec x 2;   R/L hip flexor stretch 15 sec each    09/28/22: Therapeutic exercise:  *Recumbent bike (lifetime fitness) L1 x 2.5 min for warm up *Side stepping R/L with UE on rail with increased step height  * tandem gait forward / backward with intermittent UE support on rail * Rt single heel raises x 5 * NuStep, LE only, L3 x 4 min  * stairs -reciprocal pattern with BUE on rails x 12 up/down * alternating slow toe taps to 7" step x 10 without UE support * R/L/R quad stretch with foot on 7" step * reviewed sit to/from supine via log roll  * Lt sidelying:  Rt clams x 10, reverse - alternating ER/IR x 10 * 5 bridges   09/25/22 Re-assess/cert Functional testing Foto  09/20/22 Pt seen for aquatic therapy today.  Treatment took place in water 3.5-4.75 ft in depth at the [facility] pool. Temp of water was 91.  Pt entered/exited the pool via stairs independently in step-to pattern with bilat rail.  * walking unsupported forward/ backward x 5 laps with cues for increased step height of RLE * tandem gait forward/ backward, hands out of water 2 laps  *  monster walk forward/ backward * farmer carry with solid noodle held at side, with forward/backward marching  * TrA set with solid noodle pull down to thighs, isometric holds on return to surface x 10 * UE on solid noodle:  leg swings into hip flex/ ext x 10; single leg heel raises x 10 each;  * SLS with noodle stomp (solid noodle), no UE support x 10 slow *STS 3rd step x 5, 4th step x 5, slow eccentric lowering to step   8/11  Nustep warm up 5 min, Lvl 3  Prone quad stretch with strap 30s 4x  Supine piriformis stretch 30s 4x Bridge with GTB at knees 3x8 Standing hip flexor stretch 30s 4x  8" box step up with UE 3x8   PATIENT EDUCATION:  Education details:exercise form/rationale Person educated: Patient Education method: Explanation, Demonstration, Tactile cues, Verbal cues,  Education comprehension: verbalized understanding,  returned demonstration, verbal cues required, tactile cues required, and needs further education  HOME EXERCISE PROGRAM: Access Code: [email or web address] URL: [email or web address] Date: 09/28/2022 Prepared by: [facility] - [address]  Exercises - Supine Piriformis Stretch with Foot on Ground  - 2 x daily - 7 x weekly - 1 sets - 3 reps - 30 hold - Supine Bridge with Mini Swiss Ball Between Knees  - 1 x daily - 7 x weekly - 2 sets - 10 reps - 3s hold - Step Up  - 1 x daily - 7 x weekly - 2 sets - 10 reps - Quadricep Stretch with Chair and Counter Support  - 1 x daily - 7 x weekly - 2-3 reps - 20 seconds  hold - Side Stepping with Counter Support  - 1 x daily - 7 x weekly - 2 sets - 10 reps - Standing Toe Taps  - 1 x daily - 7 x weekly - 1-2 sets - 10 reps   ASSESSMENT:  CLINICAL IMPRESSION: Patient completes step  exercise with unilateral UE support for balance. Patient demonstrating improving eccentric strength and motor control. Continued with strength training machines which are tolerated well with progressive resistance as able. Patient will continue to benefit from physical therapy in order to improve function and reduce impairment.    OBJECTIVE IMPAIRMENTS: Abnormal gait, decreased activity tolerance, decreased endurance, difficulty walking, decreased ROM, decreased strength, increased muscle spasms, improper body mechanics, postural dysfunction, and pain.   ACTIVITY LIMITATIONS: carrying, lifting, bending, sitting, squatting, stairs, transfers, bed mobility, bathing, and locomotion level  PARTICIPATION LIMITATIONS: meal prep, cleaning, laundry, shopping, community activity, occupation, yard work, and church  PERSONAL FACTORS: see PMH  REHAB POTENTIAL: Good  CLINICAL DECISION MAKING: Evolving/moderate complexity  EVALUATION COMPLEXITY: Moderate   GOALS: Goals reviewed with patient? Yes  SHORT TERM GOALS: Target date: 7/26  Able to demo proper hip  ext in toe off phase of gait Baseline: Goal status: Met - 09/06/22   LONG TERM GOALS: Target date: 11/09/22  Meet FOTO goal Baseline:  Goal status: Met 09/14/22  2.  Find location to continue aquatic HEP Baseline:  Goal status: in progress 10/02/22  3.  5TSTS to 20s or less Baseline:  Goal status: Met - 09/06/22  4.  TUG to 13.5s to indicate low fall risk and proper functional ability Baseline:  Goal status: in progress (14.51 09/25/22)   5. 3 minute walk test to 425 or > ft to demonstrate improved toleration to amb and level of mobility.    Baseline: 325    Goal Status: New  6. Pt will amb without AD safety    Baseline: walking stick    Goal status: New    7. Pt will be indep with final HEP's (land and aquatic as appropriate) for continued management of condition     Baseline: aquatic to be assigned    Goal Status: New  8. Pt will improve strength in R hip flex to at least 5lb from contralateral side to demonstrate improved overall   physical function   Baseline: see chart  Goal Status: New   PLAN:  PT FREQUENCY: 1-2x/week  PT DURATION: 6 weeks 8 visits  PLANNED INTERVENTIONS: Therapeutic exercises, Therapeutic activity, Neuromuscular re-education, Balance training, Gait training, Patient/Family education, Self Care, Joint mobilization, Stair training, Aquatic Therapy, Dry Needling, Electrical stimulation, Spinal mobilization, Cryotherapy, Moist heat, Taping, Traction, Ionotophoresis 4mg /ml Dexamethasone, Manual therapy, and Re-evaluation.  PLAN FOR NEXT SESSION: Land: progress hip strengthening and ROM. Gait training, amb toleration    1:49 PM, 10/10/22 Wyman Songster PT, DPT Physical Therapist at [facility]       Referring diagnosis? R26.2 M15.9 Treatment diagnosis? (if different than referring diagnosis) M25.551 M62.81 R26.89 What was this (referring dx) caused by? [x]  Surgery []  Fall []  Ongoing issue []  Arthritis []  Other:  ____________  Laterality: [x]  Rt []  Lt []  Both  Check all possible CPT codes:  *CHOOSE 10 OR LESS*    []  97110 (Therapeutic Exercise)  []  92507 (SLP Treatment)  []  97112 (Neuro Re-ed)   []  92526 (Swallowing Treatment)   []  97116 (Gait Training)   []  [id number] (Cognitive Training, 1st 15 minutes) []  97140 (Manual Therapy)   []  97130 (Cognitive Training, each add'l 15 minutes)  []  97164 (Re-evaluation)                              []  Other, List CPT Code ____________  []  97530 (Therapeutic Activities)     []  16109 (Self Care)   [x]  All codes above (97110 - 97535)  []  97012 (Mechanical Traction)  [x]  97014 (E-stim Unattended)  []  97032 (E-stim manual)  [x]  97033 (Ionto)  [x]  97035 (Ultrasound) [x]  97750 (Physical Performance Training) [x]  [id number] (Aquatic Therapy) []  97016 (Vasopneumatic Device) []  [id number] (Paraffin) []  97034 (Contrast Bath) []  97597 (Wound Care 1st 20 sq cm) []  97598 (Wound Care each add'l 20 sq cm) []  97760 (Orthotic Fabrication, Fitting, Training Initial) []  [id number] (Prosthetic Management and Training Initial) []  [id number] (Orthotic or Prosthetic Training/ Modification Subsequent)

## 2022-10-11 ENCOUNTER — Ambulatory Visit (INDEPENDENT_AMBULATORY_CARE_PROVIDER_SITE_OTHER): Payer: Medicare PPO

## 2022-10-11 ENCOUNTER — Ambulatory Visit: Payer: Medicare PPO | Admitting: Podiatry

## 2022-10-11 DIAGNOSIS — M2042 Other hammer toe(s) (acquired), left foot: Secondary | ICD-10-CM

## 2022-10-11 DIAGNOSIS — M205X2 Other deformities of toe(s) (acquired), left foot: Secondary | ICD-10-CM

## 2022-10-11 NOTE — Progress Notes (Signed)
Subjective:   Patient ID: Wanda Lang, female   DOB: 72 y.o.   MRN: [id number]   HPI Chief Complaint  Patient presents with   Hammer Toe    Left 2nd toe hammertoe. Painful due to rubbing against shoe.    72 year old female presents the office today with above concerns.  She said the second toe was frozen and it "sticks up"  Some days are better than others.  Area is tender mostly with pressure, shoes.  On the right crusted over the big toe but is not "frozen" but the left foot is.  She does have rheumatoid arthritis.  Review of Systems  All other systems reviewed and are negative.  Past Medical History:  Diagnosis Date   Allergy    Arthritis    Coronary artery disease    Fatty liver    GERD (gastroesophageal reflux disease)    Heart murmur    Hypertension    Hx   LIVER FUNCTION TESTS, ABNORMAL, HX OF 09/08/2007   Qualifier: Diagnosis of  By: Nelson-Smith CMA (AAMA), Dottie     Neuromuscular disorder [facility])    Essential Tremor   Overactive bladder     Past Surgical History:  Procedure Laterality Date   ABDOMINAL HYSTERECTOMY     COLONOSCOPY WITH PROPOFOL N/A 09/04/2018   Procedure: COLONOSCOPY WITH PROPOFOL;  Surgeon: Pasty Spillers, MD;  Location: [facility];  Service: Endoscopy;  Laterality: N/A;   ESOPHAGOGASTRODUODENOSCOPY (EGD) WITH PROPOFOL N/A 09/04/2018   Procedure: ESOPHAGOGASTRODUODENOSCOPY (EGD) WITH PROPOFOL;  Surgeon: Pasty Spillers, MD;  Location: [facility];  Service: Endoscopy;  Laterality: N/A;   LAPAROSCOPY     for endometriosis x2   TONSILLECTOMY     TOTAL HIP ARTHROPLASTY Right 05/15/2022   Procedure: RIGHT TOTAL HIP ARTHROPLASTY ANTERIOR APPROACH;  Surgeon: Kathryne Hitch, MD;  Location: [facility];  Service: Orthopedics;  Laterality: Right;   TOTAL KNEE ARTHROPLASTY Left 07/18/2021   Procedure: TOTAL KNEE ARTHROPLASTY;  Surgeon: Durene Romans, MD;  Location: [facility];  Service: Orthopedics;  Laterality: Left;   TURBINATE  REDUCTION       Current Outpatient Medications:    acetaminophen (TYLENOL) 500 MG tablet, Take 1,000 mg by mouth every 6 (six) hours as needed for moderate pain., Disp: , Rfl:    b complex vitamins capsule, Take 1 capsule by mouth daily., Disp: , Rfl:    CALCIUM PO, Take 1,200 mg by mouth daily., Disp: , Rfl:    celecoxib (CELEBREX) 200 MG capsule, Take 200 mg by mouth daily., Disp: , Rfl:    cycloSPORINE (RESTASIS) 0.05 % ophthalmic emulsion, Place 1 drop into both eyes 2 (two) times daily., Disp: , Rfl:    estradiol (VIVELLE-DOT) 0.05 MG/24HR patch, Place 1 patch onto the skin 2 (two) times a week., Disp: , Rfl:    Flaxseed, Linseed, (FLAXSEED OIL) 1200 MG CAPS, Take 1,200 mg by mouth daily., Disp: , Rfl:    Glucosamine HCl 1000 MG TABS, Take 1,000 mg by mouth daily., Disp: , Rfl:    Magnesium 100 MG TABS, Take 100 mg by mouth 2 (two) times a week., Disp: , Rfl:    Multiple Vitamin (MULTIVITAMIN WITH MINERALS) TABS tablet, Take 1 tablet by mouth daily., Disp: , Rfl:    oxybutynin (DITROPAN XL) 15 MG 24 hr tablet, Take 1 tablet (15 mg total) by mouth daily., Disp: 30 tablet, Rfl: 5   Probiotic Product (PROBIOTIC DAILY PO), Take 1 Dose by mouth daily., Disp: , Rfl:  trolamine salicylate (BLUE-EMU HEMP) 10 % cream, Apply 1 Application topically as needed for muscle pain., Disp: , Rfl:    Vitamin D, Ergocalciferol, (DRISDOL) 1.25 MG (50000 UNIT) CAPS capsule, Take 50,000 Units by mouth every 14 (fourteen) days., Disp: , Rfl:   Allergies  Allergen Reactions   Penicillins Itching    Tolerated Cephalosporin 07/18/21.   Other reaction(s): wheezing   Amoxicillin Itching   Misc. Sulfonamide Containing Compounds    Rosanil Cleanser [Sulfacetamide Sodium-Sulfur] Other (See Comments)    Unknown reaction    Sulfonamide Derivatives     Unknown reaction    Ylang-Ylang [[place] Oil (Ylang-Ylang)] Itching           Objective:  Physical Exam  General: AAO x3, NAD  Dermatological: There  are no open lesions noted however there is mild erythema on the dorsal PIPJ of the second toe where it is rubbing inside shoes there is no skin breakdown or warmth.  There is no fluctuation or crepitation there is no open lesions.  Vascular: Dorsalis Pedis artery and Posterior Tibial artery pedal pulses are 2/4 bilateral with immedate capillary fill time.  There is no pain with calf compression, swelling, warmth, erythema.   Neruologic: Grossly intact via light touch bilateral.   Musculoskeletal: Rigid hammertoe contracture present of the second digit of the second toe overlaps the hallux of the left side worse than the right.  Digital contractures are noted.  Upon evaluation, plantar surface left third toe already touches the hallux and the second toe is not visible.  Gait: Unassisted, Nonantalgic.       Assessment:   72 year old female with digital contracture     Plan:  -Treatment options discussed including all alternatives, risks, and complications -Etiology of symptoms were discussed -X-rays obtained reviewed.  3 views of the foot were obtained.  There is no evidence of acute fracture noted.  Medial deviation of the digits and second toes overlapping.  Hammertoes are noted. -We discussed the conservative as well as surgical treatment options.  Conservatively discussed shoe modifications, deeper toebox.  Discussed offloading pad was dispensed.  Surgically we discussed different options to try to reconstruct the foot but she asked about amputation we discussed this.  She would like to proceed with amputation of the second toe which was delayed/complete physical therapy for other issues.  Vivi Barrack DPM

## 2022-10-15 ENCOUNTER — Ambulatory Visit (HOSPITAL_BASED_OUTPATIENT_CLINIC_OR_DEPARTMENT_OTHER): Payer: Medicare PPO | Admitting: Physical Therapy

## 2022-10-15 DIAGNOSIS — R2689 Other abnormalities of gait and mobility: Secondary | ICD-10-CM

## 2022-10-15 DIAGNOSIS — M5459 Other low back pain: Secondary | ICD-10-CM | POA: Diagnosis not present

## 2022-10-15 DIAGNOSIS — M25551 Pain in right hip: Secondary | ICD-10-CM

## 2022-10-15 DIAGNOSIS — M6281 Muscle weakness (generalized): Secondary | ICD-10-CM | POA: Diagnosis not present

## 2022-10-15 NOTE — Therapy (Signed)
OUTPATIENT PHYSICAL THERAPY TREATMENT   Patient Name: Wanda Lang MRN: [id number] DOB:[birth year], 72 y.o., female Today's Date: 10/15/2022  END OF SESSION:        Past Medical History:  Diagnosis Date   Allergy    Arthritis    Coronary artery disease    Fatty liver    GERD (gastroesophageal reflux disease)    Heart murmur    Hypertension    Hx   LIVER FUNCTION TESTS, ABNORMAL, HX OF 09/08/2007   Qualifier: Diagnosis of  By: Nelson-Smith CMA (AAMA), Dottie     Neuromuscular disorder [facility])    Essential Tremor   Overactive bladder    Past Surgical History:  Procedure Laterality Date   ABDOMINAL HYSTERECTOMY     COLONOSCOPY WITH PROPOFOL N/A 09/04/2018   Procedure: COLONOSCOPY WITH PROPOFOL;  Surgeon: Pasty Spillers, MD;  Location: [facility] ENDOSCOPY;  Service: Endoscopy;  Laterality: N/A;   ESOPHAGOGASTRODUODENOSCOPY (EGD) WITH PROPOFOL N/A 09/04/2018   Procedure: ESOPHAGOGASTRODUODENOSCOPY (EGD) WITH PROPOFOL;  Surgeon: Pasty Spillers, MD;  Location: [facility] ENDOSCOPY;  Service: Endoscopy;  Laterality: N/A;   LAPAROSCOPY     for endometriosis x2   TONSILLECTOMY     TOTAL HIP ARTHROPLASTY Right 05/15/2022   Procedure: RIGHT TOTAL HIP ARTHROPLASTY ANTERIOR APPROACH;  Surgeon: Kathryne Hitch, MD;  [facility] OR;  Service: Orthopedics;  Laterality: Right;   TOTAL KNEE ARTHROPLASTY Left 07/18/2021   Procedure: TOTAL KNEE ARTHROPLASTY;  Surgeon: Durene Romans, MD;  Location: [facility];  Service: Orthopedics;  Laterality: Left;   TURBINATE REDUCTION     Patient Active Problem List   Diagnosis Date Noted   Nocturia 10/05/2022   Hypersomnia 10/05/2022   S/P total hip arthroplasty 05/17/2022   Status post total replacement of right hip 05/15/2022   Overactive bladder 04/04/2022   Postmenopausal estrogen deficiency 11/13/2021   Primary osteoarthritis 11/13/2021   Leg length inequality 11/06/2021   Impaired ambulation 10/23/2021   Contact dermatitis  10/16/2021   SI joint arthritis 08/24/2021   Spinal stenosis, lumbar region, without neurogenic claudication 08/24/2021   Degeneration of lumbar intervertebral disc 07/24/2021   S/P total knee arthroplasty, left 07/18/2021   Preop examination 07/07/2021   Right hip pain 07/07/2021   Low back pain 07/07/2021   Drug-induced immunodeficiency (HCC) 06/12/2021   Impingement syndrome of right shoulder region 06/12/2021   COVID-19 01/24/2021   Heart murmur 11/24/2020   Hammer toe 04/22/2020   Metatarsalgia of left foot 04/22/2020   Osteoarthritis of midfoot 04/22/2020   Duodenal ulcer 11/12/2019   Stress 11/12/2019   Morbid obesity (HCC) 11/06/2019   Pain in left knee 07/30/2019   Aortic stenosis, moderate 02/06/2018   Maxillary sinusitis, chronic 10/24/2017   Chronic diarrhea 10/09/2017   Acid reflux 07/15/2017   Asymptomatic carotid artery stenosis 07/15/2017   Cyst of skin 07/15/2017   Immunocompromised (HCC) 11/09/2016   Allergic rhinitis 11/02/2016   Benign essential tremor 11/02/2016   History of hypertension 11/02/2016   Postmenopausal symptoms 07/01/2015   Seasonal allergic rhinitis due to pollen 07/01/2015   Fatty liver 09/08/2007   Rheumatoid arthritis (HCC) 09/08/2007    REFERRING PROVIDER:   Kathryne Hitch, MD    REFERRING DIAG:  R26.2 (ICD-10-CM) - Impaired ambulation  M15.9 (ICD-10-CM) - Primary osteoarthritis involving multiple joints  S/p Rt THA 05/15/22  Rationale for Evaluation and Treatment: Rehabilitation  THERAPY DIAG:  No diagnosis found.  ONSET DATE: Rt THA 05/15/22, deconditioned prior to surgery   SUBJECTIVE:  SUBJECTIVE STATEMENT: Pt is 5 months s/p R THA with anterior approach. Pt hasn't performed her aquatic program due to not having access to a pool.   She is planning on joining the [employer] in order to perform her aquatic program and gym exercises.  Pt was tired after prior Rx and had soreness the following day.  Her knees were sore after prior visit.  Pt reports walking up and down steps is easier.  Pt reports  sit to stand transfers from a chair and toilet are easier.  Pt states her legs are stronger.  Pt reports she is doing good. Still feeling weakness.    PERTINENT HISTORY:  LLD noted prior to surgery & unable to fully correct L TKA in 2023, RA  PAIN:  Are you having pain? yes : NPRS scale: 2-3/10  Pain location: Rt anterior hip Pain description:  soreness Aggravating factors: SLS Relieving factors: knee flexed toes turned in  PRECAUTIONS:  Fall  WEIGHT BEARING RESTRICTIONS:  No  FALLS:  Has patient fallen in last 6 months? No  LIVING ENVIRONMENT: Lives with: lives alone Lives in: House/apartment Stairs: yes, also has stair lift Has following equipment at home:  walking stick   PLOF:  Independent  PATIENT GOALS:  Decrease pain, day to day stuff   OBJECTIVE:    LOWER EXTREMITY MMT:    MMT Right 09/25/22 Left 09/25/22  Hip flexion 34.2 53.7  Hip extension    Hip abduction 35.4 40.5  Hip adduction    Hip internal rotation    Hip external rotation    Knee flexion    Knee extension 39.0 43.9  Ankle dorsiflexion    Ankle plantarflexion    Ankle inversion    Ankle eversion     (Blank rows = not tested)  PATIENT SURVEYS:  FOTO 38 09/14/22:44% 09/25/22: 49%  COGNITIVE STATUS: Within functional limits for tasks assessed   RED FLAGS: None    SENSATION: WFL  POSTURE:  Eval: decr lumbar lordosis in standing, slight forward flexion, weight shifted to the Lt  GAIT: EVAL: antalgic gait with walking stick, +Rt trendelenburg   Body Part #1 Hip  PALPATION: EVAL: limited mobility in Rt SIJ; notable anterior rotation of Rt innominate concordant pain in trigger points in Rt gluts,  piriformis    FUNCTIONAL TESTS:  Timed up and go (TUG): NT at eval due to pain 3 minute walk test: NT at eval 30s chair stand test   09/06/22: 5x STS= 18.36sec (from hot tub surround) 09/25/22: TUG=14.51     5x STS=1404     3 min walk test=340ft (around perimeter of pool on deck in pool shoes) using walking stick      TODAY'S TREATMENT:       10/15/2022 Bridges 2x10 cybex: Leg press, 40# 3 x 10  LF: seated hamstring curls, 10# 2x 15 LF: knee ext 10# 2x15 reps  Sidestepping x 2 laps at rail with UE support on rail  Alt Toe taps 6 inch step 2x10  Step up 6 inch 2 x 10 bilateral Lateral step down 4 inch 2 x 10 bilateral  10/10/22 Step up 6 inch 2 x 10 bilateral Lateral step down 4 inch 2 x 10 bilateral LF: seated hip addct, 40# x 10- 3 sets LF: seated hip abdct, 45# x 10 - 2 sets, 55# 1 x 10 LF: seated hamstring curls, 10# 2x 15 LF: Leg press, 20# 3 x 10   10/04/22:  Therapeutic exercise: NuStep L4: UE/LE x 5  LF: Leg press, 10# x 5, 20# x 5; 10# x 10 LF: seated hip addct, 40# x 10- 2 sets LF: seated hip abdct, 40# x 10 - 3 sets LF: seated hamstring curls, 10# x 20 LF: knee ext 10# x 8 reps  Toe taps to 1st step without UE -> light touch (1 finger each UE) on rail  x 8 each side  R/L/R quad stretch with foot on 2nd step behind her x 20s each  10/02/22 Pt seen for aquatic therapy today.  Treatment took place in water 3.5-4.75 ft in depth at the [facility] pool. Temp of water was 91.  Pt entered/exited the pool via stairs independently in step-to pattern with bilat rail. * farmer carry with single/ bilat yellow hand floats held at side, with forward/backward/ sideways marching  * side stepping -> side squat with arm addct with yellow hand floats  * 3 way LE kick, UE on yellow hand floats 3 sets of 5 * TrA set with solid noodle pull down to thighs, isometric holds on return to surface x 10 * SLS with noodle stomp (hollow noodle), no UE support x 10 slow, 10 fast ->  yellow noodle  * standing balance on yellow noodle, marching (good challenge)  * seated balance on noodle with single hand lifts (challenge)  * monster walk forward/ backward * tandem gait -> tandem stance with eyes closed * toe taps alternating -1st / 2nd step, without UE support   *hamstring stretch R/L x 15 sec x 2;  R/L hip flexor stretch 15 sec each    09/28/22: Therapeutic exercise:  *Recumbent bike (lifetime fitness) L1 x 2.5 min for warm up *Side stepping R/L with UE on rail with increased step height  * tandem gait forward / backward with intermittent UE support on rail * Rt single heel raises x 5 * NuStep, LE only, L3 x 4 min  * stairs -reciprocal pattern with BUE on rails x 12 up/down * alternating slow toe taps to 7" step x 10 without UE support * R/L/R quad stretch with foot on 7" step * reviewed sit to/from supine via log roll  * Lt sidelying:  Rt clams x 10, reverse - alternating ER/IR x 10 * 5 bridges   09/25/22 Re-assess/cert Functional testing Foto  09/20/22 Pt seen for aquatic therapy today.  Treatment took place in water 3.5-4.75 ft in depth at the [facility] pool. Temp of water was 91.  Pt entered/exited the pool via stairs independently in step-to pattern with bilat rail.  * walking unsupported forward/ backward x 5 laps with cues for increased step height of RLE * tandem gait forward/ backward, hands out of water 2 laps  * monster walk forward/ backward * farmer carry with solid noodle held at side, with forward/backward marching  * TrA set with solid noodle pull down to thighs, isometric holds on return to surface x 10 * UE on solid noodle:  leg swings into hip flex/ ext x 10; single leg heel raises x 10 each;  * SLS with noodle stomp (solid noodle), no UE support x 10 slow *STS 3rd step x 5, 4th step x 5, slow eccentric lowering to step   8/11  Nustep warm up 5 min, Lvl 3  Prone quad stretch with strap 30s 4x  Supine piriformis stretch 30s  4x Bridge with GTB at knees 3x8 Standing hip flexor stretch 30s 4x  8" box step up with UE 3x8   PATIENT EDUCATION:  Education details:exercise form/rationale Person educated: Patient Education method: Explanation, Demonstration, Tactile cues, Verbal cues,  Education comprehension: verbalized understanding, returned demonstration, verbal cues required, tactile cues required, and needs further education  HOME EXERCISE PROGRAM: Access Code: [email or web address] URL: [email or web address] Date: 09/28/2022 Prepared by: [facility] - Outpatient Rehab - [address]  Exercises - Supine Piriformis Stretch with Foot on Ground  - 2 x daily - 7 x weekly - 1 sets - 3 reps - 30 hold - Supine Bridge with Mini Swiss Ball Between Knees  - 1 x daily - 7 x weekly - 2 sets - 10 reps - 3s hold - Step Up  - 1 x daily - 7 x weekly - 2 sets - 10 reps - Quadricep Stretch with Chair and Counter Support  - 1 x daily - 7 x weekly - 2-3 reps - 20 seconds  hold - Side Stepping with Counter Support  - 1 x daily - 7 x weekly - 2 sets - 10 reps - Standing Toe Taps  - 1 x daily - 7 x weekly - 1-2 sets - 10 reps   ASSESSMENT:  CLINICAL IMPRESSION: Patient completes step  exercise with unilateral UE support for balance. Patient demonstrating improving eccentric strength and motor control. Continued with strength training machines which are tolerated well with progressive resistance as able. Patient will continue to benefit from physical therapy in order to improve function and reduce impairment.  Pt had some difficulty and soreness in knees with 6 inch step.  PT decreased height to 4 inch and pt had improved control/form and reduced soreness.    OBJECTIVE IMPAIRMENTS: Abnormal gait, decreased activity tolerance, decreased endurance, difficulty walking, decreased ROM, decreased strength, increased muscle spasms, improper body mechanics, postural dysfunction, and pain.   ACTIVITY LIMITATIONS: carrying, lifting,  bending, sitting, squatting, stairs, transfers, bed mobility, bathing, and locomotion level  PARTICIPATION LIMITATIONS: meal prep, cleaning, laundry, shopping, community activity, occupation, yard work, and church  PERSONAL FACTORS: see PMH  REHAB POTENTIAL: Good  CLINICAL DECISION MAKING: Evolving/moderate complexity  EVALUATION COMPLEXITY: Moderate   GOALS: Goals reviewed with patient? Yes  SHORT TERM GOALS: Target date: 7/26  Able to demo proper hip ext in toe off phase of gait Baseline: Goal status: Met - 09/06/22   LONG TERM GOALS: Target date: 11/09/22  Meet FOTO goal Baseline:  Goal status: Met 09/14/22  2.  Find location to continue aquatic HEP Baseline:  Goal status: in progress 10/02/22  3.  5TSTS to 20s or less Baseline:  Goal status: Met - 09/06/22  4.  TUG to 13.5s to indicate low fall risk and proper functional ability Baseline:  Goal status: in progress (14.51 09/25/22)   5. 3 minute walk test to 425 or > ft to demonstrate improved toleration to amb and level of mobility.    Baseline: 325    Goal Status: New    6. Pt will amb without AD safety    Baseline: walking stick    Goal status: New    7. Pt will be indep with final HEP's (land and aquatic as appropriate) for continued management of condition     Baseline: aquatic to be assigned    Goal Status: New  8. Pt will improve strength in R hip flex to at least 5lb from contralateral side to demonstrate improved overall   physical function   Baseline: see chart  Goal Status: New   PLAN:  PT FREQUENCY: 1-2x/week  PT DURATION: 6 weeks 8 visits  PLANNED INTERVENTIONS:  Therapeutic exercises, Therapeutic activity, Neuromuscular re-education, Balance training, Gait training, Patient/Family education, Self Care, Joint mobilization, Stair training, Aquatic Therapy, Dry Needling, Electrical stimulation, Spinal mobilization, Cryotherapy, Moist heat, Taping, Traction, Ionotophoresis 4mg /ml Dexamethasone,  Manual therapy, and Re-evaluation.  PLAN FOR NEXT SESSION: Land: progress hip strengthening and ROM. Gait training, amb toleration    10:29 AM, 10/15/22 Wyman Songster PT, DPT Physical Therapist at [facility]       Referring diagnosis? R26.2 M15.9 Treatment diagnosis? (if different than referring diagnosis) M25.551 M62.81 R26.89 What was this (referring dx) caused by? [x]  Surgery []  Fall []  Ongoing issue []  Arthritis []  Other: ____________  Laterality: [x]  Rt []  Lt []  Both  Check all possible CPT codes:  *CHOOSE 10 OR LESS*    []  97110 (Therapeutic Exercise)  []  92507 (SLP Treatment)  []  97112 (Neuro Re-ed)   []  16109 (Swallowing Treatment)   []  97116 (Gait Training)   []  [id number] (Cognitive Training, 1st 15 minutes) []  97140 (Manual Therapy)   []  97130 (Cognitive Training, each add'l 15 minutes)  []  97164 (Re-evaluation)                              []  Other, List CPT Code ____________  []  97530 (Therapeutic Activities)     []  97535 (Self Care)   [x]  All codes above (97110 - 97535)  []  97012 (Mechanical Traction)  [x]  97014 (E-stim Unattended)  []  97032 (E-stim manual)  [x]  97033 (Ionto)  [x]  97035 (Ultrasound) [x]  97750 (Physical Performance Training) [x]  [id number] (Aquatic Therapy) []  97016 (Vasopneumatic Device) []  [id number] (Paraffin) []  97034 (Contrast Bath) []  97597 (Wound Care 1st 20 sq cm) []  97598 (Wound Care each add'l 20 sq cm) []  97760 (Orthotic Fabrication, Fitting, Training Initial) []  [id number] (Prosthetic Management and Training Initial) []  [id number] (Orthotic or Prosthetic Training/ Modification Subsequent)

## 2022-10-16 ENCOUNTER — Encounter (HOSPITAL_BASED_OUTPATIENT_CLINIC_OR_DEPARTMENT_OTHER): Payer: Self-pay | Admitting: Physical Therapy

## 2022-10-17 ENCOUNTER — Ambulatory Visit (HOSPITAL_BASED_OUTPATIENT_CLINIC_OR_DEPARTMENT_OTHER): Payer: Medicare PPO | Admitting: Physical Therapy

## 2022-10-17 ENCOUNTER — Encounter (HOSPITAL_BASED_OUTPATIENT_CLINIC_OR_DEPARTMENT_OTHER): Payer: Self-pay | Admitting: Physical Therapy

## 2022-10-17 DIAGNOSIS — M25551 Pain in right hip: Secondary | ICD-10-CM | POA: Diagnosis not present

## 2022-10-17 DIAGNOSIS — M5459 Other low back pain: Secondary | ICD-10-CM | POA: Diagnosis not present

## 2022-10-17 DIAGNOSIS — M6281 Muscle weakness (generalized): Secondary | ICD-10-CM

## 2022-10-17 DIAGNOSIS — R2689 Other abnormalities of gait and mobility: Secondary | ICD-10-CM | POA: Diagnosis not present

## 2022-10-17 NOTE — Therapy (Signed)
OUTPATIENT PHYSICAL THERAPY TREATMENT   Patient Name: Wanda Lang MRN: [id number] DOB:[birth year], 72 y.o., female Today's Date: 10/17/2022  END OF SESSION:  PT End of Session - 10/17/22 1449     Visit Number 15    Number of Visits 17    Date for PT Re-Evaluation 11/09/22    Authorization Type human[employer] MCR    PT Start Time 1452    PT Stop Time 1530    PT Time Calculation (min) 38 min    Activity Tolerance Patient tolerated treatment well    Behavior During Therapy WFL for tasks assessed/performed                  Past Medical History:  Diagnosis Date   Allergy    Arthritis    Coronary artery disease    Fatty liver    GERD (gastroesophageal reflux disease)    Heart murmur    Hypertension    Hx   LIVER FUNCTION TESTS, ABNORMAL, HX OF 09/08/2007   Qualifier: Diagnosis of  By: Nelson-Smith CMA (AAMA), Dottie     Neuromuscular disorder (HCC)    Essential Tremor   Overactive bladder    Past Surgical History:  Procedure Laterality Date   ABDOMINAL HYSTERECTOMY     COLONOSCOPY WITH PROPOFOL N/A 09/04/2018   Procedure: COLONOSCOPY WITH PROPOFOL;  Surgeon: Pasty Spillers, MD;  Location: [facility] ENDOSCOPY;  Service: Endoscopy;  Laterality: N/A;   ESOPHAGOGASTRODUODENOSCOPY (EGD) WITH PROPOFOL N/A 09/04/2018   Procedure: ESOPHAGOGASTRODUODENOSCOPY (EGD) WITH PROPOFOL;  Surgeon: Pasty Spillers, MD;  Location: [facility] ENDOSCOPY;  Service: Endoscopy;  Laterality: N/A;   LAPAROSCOPY     for endometriosis x2   TONSILLECTOMY     TOTAL HIP ARTHROPLASTY Right 05/15/2022   Procedure: RIGHT TOTAL HIP ARTHROPLASTY ANTERIOR APPROACH;  Surgeon: Kathryne Hitch, MD;  Location: [facility];  Service: Orthopedics;  Laterality: Right;   TOTAL KNEE ARTHROPLASTY Left 07/18/2021   Procedure: TOTAL KNEE ARTHROPLASTY;  Surgeon: Durene Romans, MD;  Location: [facility];  Service: Orthopedics;  Laterality: Left;   TURBINATE REDUCTION     Patient Active Problem List   Diagnosis Date  Noted   Nocturia 10/05/2022   Hypersomnia 10/05/2022   S/P total hip arthroplasty 05/17/2022   Status post total replacement of right hip 05/15/2022   Overactive bladder 04/04/2022   Postmenopausal estrogen deficiency 11/13/2021   Primary osteoarthritis 11/13/2021   Leg length inequality 11/06/2021   Impaired ambulation 10/23/2021   Contact dermatitis 10/16/2021   SI joint arthritis 08/24/2021   Spinal stenosis, lumbar region, without neurogenic claudication 08/24/2021   Degeneration of lumbar intervertebral disc 07/24/2021   S/P total knee arthroplasty, left 07/18/2021   Preop examination 07/07/2021   Right hip pain 07/07/2021   Low back pain 07/07/2021   Drug-induced immunodeficiency (HCC) 06/12/2021   Impingement syndrome of right shoulder region 06/12/2021   COVID-19 01/24/2021   Heart murmur 11/24/2020   Hammer toe 04/22/2020   Metatarsalgia of left foot 04/22/2020   Osteoarthritis of midfoot 04/22/2020   Duodenal ulcer 11/12/2019   Stress 11/12/2019   Morbid obesity (HCC) 11/06/2019   Pain in left knee 07/30/2019   Aortic stenosis, moderate 02/06/2018   Maxillary sinusitis, chronic 10/24/2017   Chronic diarrhea 10/09/2017   Acid reflux 07/15/2017   Asymptomatic carotid artery stenosis 07/15/2017   Cyst of skin 07/15/2017   Immunocompromised (HCC) 11/09/2016   Allergic rhinitis 11/02/2016   Benign essential tremor 11/02/2016   History of hypertension 11/02/2016   Postmenopausal symptoms  07/01/2015   Seasonal allergic rhinitis due to pollen 07/01/2015   Fatty liver 09/08/2007   Rheumatoid arthritis (HCC) 09/08/2007    REFERRING PROVIDER:   Kathryne Hitch, MD    REFERRING DIAG:  R26.2 (ICD-10-CM) - Impaired ambulation  M15.9 (ICD-10-CM) - Primary osteoarthritis involving multiple joints  S/p Rt THA 05/15/22  Rationale for Evaluation and Treatment: Rehabilitation  THERAPY DIAG:  Muscle weakness (generalized)  Other abnormalities of gait and  mobility  Other low back pain  Pain in right hip  ONSET DATE: Rt THA 05/15/22, deconditioned prior to surgery   SUBJECTIVE:                                                                                                                                                                                           SUBJECTIVE STATEMENT: Pt is 5 months s/p R THA with anterior approach. Patient with tenderness and knees feel swollen. Some soreness in knees.    PERTINENT HISTORY:  LLD noted prior to surgery & unable to fully correct L TKA in 2023, RA  PAIN:  Are you having pain? yes : NPRS scale: 2-3/10  Pain location: Rt anterior hip Pain description:  soreness Aggravating factors: SLS Relieving factors: knee flexed toes turned in  PRECAUTIONS:  Fall  WEIGHT BEARING RESTRICTIONS:  No  FALLS:  Has patient fallen in last 6 months? No  LIVING ENVIRONMENT: Lives with: lives alone Lives in: House/apartment Stairs: yes, also has stair lift Has following equipment at home:  walking stick   PLOF:  Independent  PATIENT GOALS:  Decrease pain, day to day stuff   OBJECTIVE:     TODAY'S TREATMENT:       10/17/22 Step up 6 inch 2 x 10 bilateral  Lateral step down 4 inch 1 x 10 bilateral  LF: seated hamstring curls, 10# 2x 15 LF: knee ext 10# 2x15 reps  LF: seated hip addct, 40# x 10- 3 sets  LF: Leg press, 40# 3 x 10  Sidestepping x 4 laps at rail with UE support on rail Squat 1 x 10   10/15/22 Bridges 2x10 cybex: Leg press, 40# 3 x 10  LF: seated hamstring curls, 10# 2x 15 LF: knee ext 10# 2x15 reps  Sidestepping x 2 laps at rail with UE support on rail  Alt Toe taps 6 inch step 2x10  Step up 6 inch 2 x 10 bilateral Lateral step down 4 inch 2 x 10 bilateral     PATIENT EDUCATION:  Education details:  exercise form/rationale Person educated: Patient Education method: Explanation, Demonstration, Tactile cues, Verbal cues,  Education comprehension: verbalized  understanding, returned  demonstration, verbal cues required, tactile cues required, and needs further education  HOME EXERCISE PROGRAM: Access Code: [email or web address] URL: [email or web address] Date: 09/28/2022 Prepared by: [facility] - Outpatient Rehab - [address]  Exercises - Supine Piriformis Stretch with Foot on Ground  - 2 x daily - 7 x weekly - 1 sets - 3 reps - 30 hold - Supine Bridge with Mini Swiss Ball Between Knees  - 1 x daily - 7 x weekly - 2 sets - 10 reps - 3s hold - Step Up  - 1 x daily - 7 x weekly - 2 sets - 10 reps - Quadricep Stretch with Chair and Counter Support  - 1 x daily - 7 x weekly - 2-3 reps - 20 seconds  hold - Side Stepping with Counter Support  - 1 x daily - 7 x weekly - 2 sets - 10 reps - Standing Toe Taps  - 1 x daily - 7 x weekly - 1-2 sets - 10 reps   ASSESSMENT:  CLINICAL IMPRESSION: Patient with some tenderness at VMO likely due to muscle soreness. Began session with functional strengthening which is performed well. Continued with strength training machines which patient performs well. Patient will continue to benefit from physical therapy in order to improve function and reduce impairment.    OBJECTIVE IMPAIRMENTS: Abnormal gait, decreased activity tolerance, decreased endurance, difficulty walking, decreased ROM, decreased strength, increased muscle spasms, improper body mechanics, postural dysfunction, and pain.   ACTIVITY LIMITATIONS: carrying, lifting, bending, sitting, squatting, stairs, transfers, bed mobility, bathing, and locomotion level  PARTICIPATION LIMITATIONS: meal prep, cleaning, laundry, shopping, community activity, occupation, yard work, and church  PERSONAL FACTORS: see PMH  REHAB POTENTIAL: Good  CLINICAL DECISION MAKING: Evolving/moderate complexity  EVALUATION COMPLEXITY: Moderate   GOALS: Goals reviewed with patient? Yes  SHORT TERM GOALS: Target date: 7/26  Able to demo proper hip ext in toe off phase of  gait Baseline: Goal status: Met - 09/06/22   LONG TERM GOALS: Target date: 11/09/22  Meet FOTO goal Baseline:  Goal status: Met 09/14/22  2.  Find location to continue aquatic HEP Baseline:  Goal status: in progress 10/02/22  3.  5TSTS to 20s or less Baseline:  Goal status: Met - 09/06/22  4.  TUG to 13.5s to indicate low fall risk and proper functional ability Baseline:  Goal status: in progress (14.51 09/25/22)   5. 3 minute walk test to 425 or > ft to demonstrate improved toleration to amb and level of mobility.    Baseline: 325    Goal Status: New    6. Pt will amb without AD safety    Baseline: walking stick    Goal status: New    7. Pt will be indep with final HEP's (land and aquatic as appropriate) for continued management of condition     Baseline: aquatic to be assigned    Goal Status: New  8. Pt will improve strength in R hip flex to at least 5lb from contralateral side to demonstrate improved overall   physical function   Baseline: see chart  Goal Status: New   PLAN:  PT FREQUENCY: 1-2x/week  PT DURATION: 6 weeks 8 visits  PLANNED INTERVENTIONS: Therapeutic exercises, Therapeutic activity, Neuromuscular re-education, Balance training, Gait training, Patient/Family education, Self Care, Joint mobilization, Stair training, Aquatic Therapy, Dry Needling, Electrical stimulation, Spinal mobilization, Cryotherapy, Moist heat, Taping, Traction, Ionotophoresis 4mg /ml Dexamethasone, Manual therapy, and Re-evaluation.  PLAN FOR NEXT SESSION: Land: progress hip strengthening and ROM. Gait training, amb  toleration    Reola Mosher Mert Dietrick, PT 10/17/2022, 2:51 PM

## 2022-10-22 ENCOUNTER — Ambulatory Visit (HOSPITAL_BASED_OUTPATIENT_CLINIC_OR_DEPARTMENT_OTHER): Payer: Medicare PPO | Admitting: Physical Therapy

## 2022-10-22 ENCOUNTER — Encounter (HOSPITAL_BASED_OUTPATIENT_CLINIC_OR_DEPARTMENT_OTHER): Payer: Self-pay | Admitting: Physical Therapy

## 2022-10-22 DIAGNOSIS — M25551 Pain in right hip: Secondary | ICD-10-CM | POA: Diagnosis not present

## 2022-10-22 DIAGNOSIS — R2689 Other abnormalities of gait and mobility: Secondary | ICD-10-CM

## 2022-10-22 DIAGNOSIS — M6281 Muscle weakness (generalized): Secondary | ICD-10-CM

## 2022-10-22 DIAGNOSIS — M0589 Other rheumatoid arthritis with rheumatoid factor of multiple sites: Secondary | ICD-10-CM | POA: Diagnosis not present

## 2022-10-22 DIAGNOSIS — M5459 Other low back pain: Secondary | ICD-10-CM | POA: Diagnosis not present

## 2022-10-22 NOTE — Progress Notes (Unsigned)
10/23/2022 12:47 PM   Wanda Lang 1950-10-26 [id number]  Referring provider: Glori Luis, MD [address] [place],  [place] [address]  Urological history: 1. Mixed incontinence -Contributing factors of age, GSM, arthritis, hypertension and hysterectomy -failed Vesicare and Gemtesa   No chief complaint on file.   HPI: Wanda Lang is a 72 y.o. female who presents today for follow-up after trial of oxybutynin XL.  Previous records reviewed.   PVR ***  PMH: Past Medical History:  Diagnosis Date   Allergy    Arthritis    Coronary artery disease    Fatty liver    GERD (gastroesophageal reflux disease)    Heart murmur    Hypertension    Hx   LIVER FUNCTION TESTS, ABNORMAL, HX OF 09/08/2007   Qualifier: Diagnosis of  By: Nelson-Smith CMA (AAMA), Dottie     Neuromuscular disorder [facility])    Essential Tremor   Overactive bladder     Surgical History: Past Surgical History:  Procedure Laterality Date   ABDOMINAL HYSTERECTOMY     COLONOSCOPY WITH PROPOFOL N/A 09/04/2018   Procedure: COLONOSCOPY WITH PROPOFOL;  Surgeon: Pasty Spillers, MD;  Location: [facility] ENDOSCOPY;  Service: Endoscopy;  Laterality: N/A;   ESOPHAGOGASTRODUODENOSCOPY (EGD) WITH PROPOFOL N/A 09/04/2018   Procedure: ESOPHAGOGASTRODUODENOSCOPY (EGD) WITH PROPOFOL;  Surgeon: Pasty Spillers, MD;  Location: [facility] ENDOSCOPY;  Service: Endoscopy;  Laterality: N/A;   LAPAROSCOPY     for endometriosis x2   TONSILLECTOMY     TOTAL HIP ARTHROPLASTY Right 05/15/2022   Procedure: RIGHT TOTAL HIP ARTHROPLASTY ANTERIOR APPROACH;  Surgeon: Kathryne Hitch, MD;  [facility] OR;  Service: Orthopedics;  Laterality: Right;   TOTAL KNEE ARTHROPLASTY Left 07/18/2021   Procedure: TOTAL KNEE ARTHROPLASTY;  Surgeon: Durene Romans, MD;  Location: [facility];  Service: Orthopedics;  Laterality: Left;   TURBINATE REDUCTION      Home Medications:  Allergies as of 10/23/2022       Reactions    Penicillins Itching   Tolerated Cephalosporin 07/18/21. Other reaction(s): wheezing   Amoxicillin Itching   Misc. Sulfonamide Containing Compounds    Rosanil Cleanser [sulfacetamide Sodium-sulfur] Other (See Comments)   Unknown reaction    Sulfonamide Derivatives    Unknown reaction    Ylang-ylang [cananga Oil (ylang-ylang)] Itching        Medication List        Accurate as of October 22, 2022 12:47 PM. If you have any questions, ask your nurse or doctor.          acetaminophen 500 MG tablet Commonly known as: TYLENOL Take 1,000 mg by mouth every 6 (six) hours as needed for moderate pain.   b complex vitamins capsule Take 1 capsule by mouth daily.   Blue-Emu Hemp 10 % cream Generic drug: trolamine salicylate Apply 1 Application topically as needed for muscle pain.   CALCIUM PO Take 1,200 mg by mouth daily.   celecoxib 200 MG capsule Commonly known as: CELEBREX Take 200 mg by mouth daily.   cycloSPORINE 0.05 % ophthalmic emulsion Commonly known as: RESTASIS Place 1 drop into both eyes 2 (two) times daily.   estradiol 0.05 MG/24HR patch Commonly known as: VIVELLE-DOT Place 1 patch onto the skin 2 (two) times a week.   Flaxseed Oil 1200 MG Caps Take 1,200 mg by mouth daily.   Glucosamine HCl 1000 MG Tabs Take 1,000 mg by mouth daily.   Magnesium 100 MG Tabs Take 100 mg by mouth 2 (  two) times a week.   multivitamin with minerals Tabs tablet Take 1 tablet by mouth daily.   oxybutynin 15 MG 24 hr tablet Commonly known as: DITROPAN XL Take 1 tablet (15 mg total) by mouth daily.   PROBIOTIC DAILY PO Take 1 Dose by mouth daily.   Vitamin D (Ergocalciferol) 1.25 MG (50000 UNIT) Caps capsule Commonly known as: DRISDOL Take 50,000 Units by mouth every 14 (fourteen) days.        Allergies:  Allergies  Allergen Reactions   Penicillins Itching    Tolerated Cephalosporin 07/18/21.   Other reaction(s): wheezing   Amoxicillin Itching   Misc.  Sulfonamide Containing Compounds    Rosanil Cleanser [Sulfacetamide Sodium-Sulfur] Other (See Comments)    Unknown reaction    Sulfonamide Derivatives     Unknown reaction    Ylang-Ylang [Cananga Oil (Ylang-Ylang)] Itching    Family History: Family History  Problem Relation Age of Onset   Alcohol abuse Mother    Arthritis Mother    Hyperlipidemia Mother    Heart disease Mother    Stroke Mother    Hypertension Mother    Depression Mother    Anxiety disorder Mother    Arthritis Father    Lung cancer Paternal Grandfather    Kidney disease Paternal Grandfather     Social History:  reports that she has never smoked. She has never used smokeless tobacco. She reports that she does not currently use alcohol after a past usage of about 1.0 standard drink of alcohol per week. She reports that she does not use drugs.  ROS: Pertinent ROS in HPI  Physical Exam: There were no vitals taken for this visit.  Constitutional:  Well nourished. Alert and oriented, No acute distress. HEENT: [place] AT, moist mucus membranes.  Trachea midline, no masses. Cardiovascular: No clubbing, cyanosis, or edema. Respiratory: Normal respiratory effort, no increased work of breathing. Neurologic: Grossly intact, no focal deficits, moving all 4 extremities. Psychiatric: Normal mood and affect.  Laboratory Data: Lab Results  Component Value Date   WBC 8.9 05/17/2022   HGB 9.4 (L) 05/17/2022   HCT 27.8 (L) 05/17/2022   MCV 89.4 05/17/2022   PLT 105 (L) 05/17/2022    Lab Results  Component Value Date   CREATININE 0.80 05/16/2022     Lab Results  Component Value Date   HGBA1C 5.5 10/05/2022    Lab Results  Component Value Date   TSH 2.31 01/05/2022       Component Value Date/Time   CHOL 177 01/05/2022 1146   HDL 57 01/05/2022 1146   CHOLHDL 3.1 01/05/2022 1146   VLDL 10.8 11/30/2020 1014   LDLCALC 100 (H) 01/05/2022 1146    Lab Results  Component Value Date   AST 15 05/03/2022   Lab  Results  Component Value Date   ALT 8 05/03/2022   Urinalysis    Component Value Date/Time   COLORURINE YELLOW 01/05/2022 1139   APPEARANCEUR Clear 09/25/2022 1026   LABSPEC >=1.030 (A) 01/05/2022 1139   PHURINE 6.0 01/05/2022 1139   GLUCOSEU Negative 09/25/2022 1026   GLUCOSEU NEGATIVE 01/05/2022 1139   HGBUR NEGATIVE 01/05/2022 1139   BILIRUBINUR Negative 09/25/2022 1026   KETONESUR trace (5) (A) 01/05/2022 1143   KETONESUR NEGATIVE 01/05/2022 1139   PROTEINUR Negative 09/25/2022 1026   PROTEINUR NEGATIVE 10/26/2021 1303   UROBILINOGEN 0.2 06/01/2022 1228   UROBILINOGEN 0.2 01/05/2022 1139   NITRITE Negative 09/25/2022 1026   NITRITE POSITIVE (A) 01/05/2022 1139   LEUKOCYTESUR Negative  09/25/2022 1026   LEUKOCYTESUR TRACE (A) 01/05/2022 1139  I have reviewed the labs.   Pertinent Imaging: ***  Assessment & Plan:  ***  1. Mixed Incontinence -PVR demonstrates ADEQUATE EMPTYING -SLEEP STUDY ***   No follow-ups on file.  These notes generated with voice recognition software. I apologize for typographical errors.  Cloretta Ned  [facility] [address]  [address] [place], [place] [address] [phone]

## 2022-10-22 NOTE — Therapy (Signed)
OUTPATIENT PHYSICAL THERAPY TREATMENT   Patient Name: Wanda Lang MRN: [id number] DOB:06/25/1950, 72 y.o., female Today's Date: 10/23/2022  END OF SESSION:  PT End of Session - 10/22/22 1543     Visit Number 16    Number of Visits 17    Date for PT Re-Evaluation 11/09/22    Authorization Type humana MCR    PT Start Time 1540    PT Stop Time 1618    PT Time Calculation (min) 38 min    Activity Tolerance Patient tolerated treatment well    Behavior During Therapy WFL for tasks assessed/performed                  Past Medical History:  Diagnosis Date   Allergy    Arthritis    Coronary artery disease    Fatty liver    GERD (gastroesophageal reflux disease)    Heart murmur    Hypertension    Hx   LIVER FUNCTION TESTS, ABNORMAL, HX OF 09/08/2007   Qualifier: Diagnosis of  By: Nelson-Smith CMA (AAMA), Dottie     Neuromuscular disorder (HCC)    Essential Tremor   Overactive bladder    Past Surgical History:  Procedure Laterality Date   ABDOMINAL HYSTERECTOMY     COLONOSCOPY WITH PROPOFOL N/A 09/04/2018   Procedure: COLONOSCOPY WITH PROPOFOL;  Surgeon: Pasty Spillers, MD;  Location: [facility] ENDOSCOPY;  Service: Endoscopy;  Laterality: N/A;   ESOPHAGOGASTRODUODENOSCOPY (EGD) WITH PROPOFOL N/A 09/04/2018   Procedure: ESOPHAGOGASTRODUODENOSCOPY (EGD) WITH PROPOFOL;  Surgeon: Pasty Spillers, MD;  Location: [facility] ENDOSCOPY;  Service: Endoscopy;  Laterality: N/A;   LAPAROSCOPY     for endometriosis x2   TONSILLECTOMY     TOTAL HIP ARTHROPLASTY Right 05/15/2022   Procedure: RIGHT TOTAL HIP ARTHROPLASTY ANTERIOR APPROACH;  Surgeon: Kathryne Hitch, MD;  Location[facility];  Service: Orthopedics;  Laterality: Right;   TOTAL KNEE ARTHROPLASTY Left 07/18/2021   Procedure: TOTAL KNEE ARTHROPLASTY;  Surgeon: Durene Romans, MD;  Location: [facility];  Service: Orthopedics;  Laterality: Left;   TURBINATE REDUCTION     Patient Active Problem List   Diagnosis Date  Noted   Nocturia 10/05/2022   Hypersomnia 10/05/2022   S/P total hip arthroplasty 05/17/2022   Status post total replacement of right hip 05/15/2022   Overactive bladder 04/04/2022   Postmenopausal estrogen deficiency 11/13/2021   Primary osteoarthritis 11/13/2021   Leg length inequality 11/06/2021   Impaired ambulation 10/23/2021   Contact dermatitis 10/16/2021   SI joint arthritis 08/24/2021   Spinal stenosis, lumbar region, without neurogenic claudication 08/24/2021   Degeneration of lumbar intervertebral disc 07/24/2021   S/P total knee arthroplasty, left 07/18/2021   Preop examination 07/07/2021   Right hip pain 07/07/2021   Low back pain 07/07/2021   Drug-induced immunodeficiency (HCC) 06/12/2021   Impingement syndrome of right shoulder region 06/12/2021   COVID-19 01/24/2021   Heart murmur 11/24/2020   Hammer toe 04/22/2020   Metatarsalgia of left foot 04/22/2020   Osteoarthritis of midfoot 04/22/2020   Duodenal ulcer 11/12/2019   Stress 11/12/2019   Morbid obesity (HCC) 11/06/2019   Pain in left knee 07/30/2019   Aortic stenosis, moderate 02/06/2018   Maxillary sinusitis, chronic 10/24/2017   Chronic diarrhea 10/09/2017   Acid reflux 07/15/2017   Asymptomatic carotid artery stenosis 07/15/2017   Cyst of skin 07/15/2017   Immunocompromised (HCC) 11/09/2016   Allergic rhinitis 11/02/2016   Benign essential tremor 11/02/2016   History of hypertension 11/02/2016   Postmenopausal symptoms  07/01/2015   Seasonal allergic rhinitis due to pollen 07/01/2015   Fatty liver 09/08/2007   Rheumatoid arthritis (HCC) 09/08/2007    REFERRING PROVIDER:   Kathryne Hitch, MD    REFERRING DIAG:  R26.2 (ICD-10-CM) - Impaired ambulation  M15.9 (ICD-10-CM) - Primary osteoarthritis involving multiple joints  S/p Rt THA 05/15/22  Rationale for Evaluation and Treatment: Rehabilitation  THERAPY DIAG:  Muscle weakness (generalized)  Other abnormalities of gait and  mobility  Other low back pain  Pain in right hip  ONSET DATE: Rt THA 05/15/22, deconditioned prior to surgery   SUBJECTIVE:                                                                                                                                                                                           SUBJECTIVE STATEMENT: Pt is 5 months s/p R THA with anterior approach.  Pt states she was sore after prior Rx and the following day.  Pt states she hasn't taken any pain meds today and hasn't used the pain patch.  Pt states she doesn't need her walking stick as much.  She is performing stairs better.     PERTINENT HISTORY:  LLD noted prior to surgery & unable to fully correct L TKA in 2023, RA  PAIN:  Are you having pain?  no : NPRS scale: 0/10  Pain location: Rt anterior hip Pain description:  soreness Aggravating factors: SLS Relieving factors: knee flexed toes turned in  PRECAUTIONS:  Fall  WEIGHT BEARING RESTRICTIONS:  No  FALLS:  Has patient fallen in last 6 months? No  LIVING ENVIRONMENT: Lives with: lives alone Lives in: House/apartment Stairs: yes, also has stair lift Has following equipment at home:  walking stick   PLOF:  Independent  PATIENT GOALS:  Decrease pain, day to day stuff   OBJECTIVE:     TODAY'S TREATMENT:       Bridges 2x10 Sidestepping in hallway with UE support on wall 2x10 Step up 6 inch 2 x 10 bilateral  Alt Toe taps 6 inch step 2x10 without UE support Squat with UE support on rail 1 x 10  LF: seated hamstring curls, 10# 2x 15 LF: knee ext 10# 2x15 reps  Cybex leg press 40# x15, 60# 2x10 Lateral step down 4 inch 1 x 10 bilateral      PATIENT EDUCATION:  Education details:  exercise form/rationale Person educated: Patient Education method: Explanation, Demonstration, Tactile cues, Verbal cues,  Education comprehension: verbalized understanding, returned demonstration, verbal cues required, tactile cues required, and  needs further education  HOME EXERCISE PROGRAM: Access Code: [email or web address] URL: [email or web address] Date: 09/28/2022 Prepared  by: [facility] - Outpatient Rehab - [address]  Exercises - Supine Piriformis Stretch with Foot on Ground  - 2 x daily - 7 x weekly - 1 sets - 3 reps - 30 hold - Supine Bridge with Mini Swiss Ball Between Knees  - 1 x daily - 7 x weekly - 2 sets - 10 reps - 3s hold - Step Up  - 1 x daily - 7 x weekly - 2 sets - 10 reps - Quadricep Stretch with Chair and Counter Support  - 1 x daily - 7 x weekly - 2-3 reps - 20 seconds  hold - Side Stepping with Counter Support  - 1 x daily - 7 x weekly - 2 sets - 10 reps - Standing Toe Taps  - 1 x daily - 7 x weekly - 1-2 sets - 10 reps   ASSESSMENT:  CLINICAL IMPRESSION: Patient is improving with function as evidenced by subjective reports of improved stair performance and not needing her walking stick as much.  Pt gives good effort with all exercises and performed exercises well.  She has good tolerance with exercise machines in the gym.  Pt had decreased control on R LE with lateral step downs on a 4 inch step.  Pt had some L knee pain on cybex leg press which decreased with improved LE positioning on platform.  Pt responded well to Rx having no pain after Rx.    OBJECTIVE IMPAIRMENTS: Abnormal gait, decreased activity tolerance, decreased endurance, difficulty walking, decreased ROM, decreased strength, increased muscle spasms, improper body mechanics, postural dysfunction, and pain.   ACTIVITY LIMITATIONS: carrying, lifting, bending, sitting, squatting, stairs, transfers, bed mobility, bathing, and locomotion level  PARTICIPATION LIMITATIONS: meal prep, cleaning, laundry, shopping, community activity, occupation, yard work, and church  PERSONAL FACTORS: see PMH  REHAB POTENTIAL: Good  CLINICAL DECISION MAKING: Evolving/moderate complexity  EVALUATION COMPLEXITY: Moderate   GOALS: Goals reviewed with  patient? Yes  SHORT TERM GOALS: Target date: 7/26  Able to demo proper hip ext in toe off phase of gait Baseline: Goal status: Met - 09/06/22   LONG TERM GOALS: Target date: 11/09/22  Meet FOTO goal Baseline:  Goal status: Met 09/14/22  2.  Find location to continue aquatic HEP Baseline:  Goal status: in progress 10/02/22  3.  5TSTS to 20s or less Baseline:  Goal status: Met - 09/06/22  4.  TUG to 13.5s to indicate low fall risk and proper functional ability Baseline:  Goal status: in progress (14.51 09/25/22)   5. 3 minute walk test to 425 or > ft to demonstrate improved toleration to amb and level of mobility.    Baseline: 325    Goal Status: New    6. Pt will amb without AD safety    Baseline: walking stick    Goal status: New    7. Pt will be indep with final HEP's (land and aquatic as appropriate) for continued management of condition     Baseline: aquatic to be assigned    Goal Status: New  8. Pt will improve strength in R hip flex to at least 5lb from contralateral side to demonstrate improved overall   physical function   Baseline: see chart  Goal Status: New   PLAN:  PT FREQUENCY: 1-2x/week  PT DURATION: 6 weeks 8 visits  PLANNED INTERVENTIONS: Therapeutic exercises, Therapeutic activity, Neuromuscular re-education, Balance training, Gait training, Patient/Family education, Self Care, Joint mobilization, Stair training, Aquatic Therapy, Dry Needling, Electrical stimulation, Spinal mobilization, Cryotherapy, Moist  heat, Taping, Traction, Ionotophoresis 4mg /ml Dexamethasone, Manual therapy, and Re-evaluation.  PLAN FOR NEXT SESSION: Land: progress hip strengthening and ROM. Gait training, amb toleration    Audie Clear III PT, DPT 10/23/22 10:33 PM

## 2022-10-23 ENCOUNTER — Encounter: Payer: Self-pay | Admitting: Urology

## 2022-10-23 ENCOUNTER — Ambulatory Visit: Payer: Medicare PPO | Admitting: Urology

## 2022-10-23 ENCOUNTER — Encounter (HOSPITAL_BASED_OUTPATIENT_CLINIC_OR_DEPARTMENT_OTHER): Payer: Self-pay | Admitting: Physical Therapy

## 2022-10-23 VITALS — BP 127/76 | HR 73

## 2022-10-23 DIAGNOSIS — N3946 Mixed incontinence: Secondary | ICD-10-CM

## 2022-10-23 LAB — BLADDER SCAN AMB NON-IMAGING: Scan Result: 48

## 2022-10-23 LAB — LAB REPORT - SCANNED: EGFR: 71

## 2022-10-23 NOTE — Patient Instructions (Signed)
Urgent PC office-based treatment for overactive bladder Take back control of your life! Urgent PC is a non-drug, non-surgical option for overactive bladder and associated symptoms of urinary urgency, urinary frequency and urge incontinence  Urgent-PC- Since 2003, healthcare professionals have used the Urgent PC Neuromodulation System as an effective office treatment for men and women suffering from overactive bladder, a condition commonly referred to as OAB. Urgent PC is up to 80% effective, even after conservative measures and OAB drugs have failed. Plus, Urgent PC is very low risk, making it a great choice for people unable or unwilling to have more invasive procedures.  How does Urgent PC work?  The Urgent PC system delivers a specific type of neuromodulation called percutaneous tibial nerve stimulation (PTNS). During treatment, a small, slim needle electrode is inserted near your ankle. The needle electrode is then connected to the battery-powered stimulator. During your 30-minute treatment, mild impulses from the stimulator travel through the needle electrode, along your leg and to the nerves in your pelvis that control bladder function. This process is also referred to as neuromodulation.  What will I feel with Urgent PC therapy?  Because patients may experience the sensation of the Urgent PC therapy in different ways, it's difficult to say what the treatment would feel like to you. Patients often describe the sensation as "tingling" or "pulsating." Treatment is typically well-tolerated by patients. Urgent PC offers many different levels of stimulation, so your clinician will be able to adjust treatment to suit you as well as address any discomfort that you might experience during treatment.  How often will I need Urgent PC treatments? You will receive an initial series of 12 treatments scheduled about a week apart. If you respond, you will likely need a treatment about once per month to  maintain your improvements.  How soon will I see results with Urgent PC? Because Urgent PC gently modifies the signals to achieve bladder control, it usually takes 5-7 weeks for symptoms to change. However, patients respond at different rates. In a review of about 100 patients who had success with Urgent PC, symptoms improved anywhere between 2-12 weeks. For about 20% of these patients, the symptoms of urgency and/or urge incontinence didn't improve until after 8 weeks.1  There is no way to anticipate who will respond earlier, later or not at all. That's why it is important to receive the 12 recommended treatments before you and your physician evaluate whether this therapy is an appropriate and effective choice for you.  How can I receive treatment with Urgent PC? Urgent PC is an option for patients with OAB. If you think you have OAB, talk to your doctor, a urologist or urogynecologist. If you have OAB, the doctor will work with you to determine your own personal treatment plan which usually starts with behavior and diet modifications plus medications. Urgent PC is an excellent option if these options don't work or provide sufficient improvements. Treatment with Urgent PC is typically performed at the office of a urologist, urogynecologist or gynecologist. So, if you run out of options with your normal doctor, consider visiting one of these specialists.  Does insurance cover Urgent PC? Urgent PC treatment is reimbursed by Medicare across the [place]. Private insurance coverage varies by state. To see if your insurance company covers Urgent PC, use our Coverage Finder or talk to your Healthcare Provider.  Are there patients who should not be treated with Urgent PC? Yes, these include: patients with pacemakers or implantable defibrillators, patients prone  to excessive bleeding, patients with nerve damage that could impact either percutaneous tibial nerve or pelvic floor function and patients who  are pregnant or planning to become pregnant during the duration of the treatment.  What are the risks associated with Urgent PC? The risks associated with Urgent PC therapy are low. Most common side-effects are temporary and include mild pain or skin inflammation at or near the stimulation site.

## 2022-10-24 DIAGNOSIS — G4733 Obstructive sleep apnea (adult) (pediatric): Secondary | ICD-10-CM | POA: Diagnosis not present

## 2022-10-24 DIAGNOSIS — R0602 Shortness of breath: Secondary | ICD-10-CM | POA: Diagnosis not present

## 2022-10-25 ENCOUNTER — Ambulatory Visit (HOSPITAL_BASED_OUTPATIENT_CLINIC_OR_DEPARTMENT_OTHER): Payer: Medicare PPO | Admitting: Physical Therapy

## 2022-10-25 ENCOUNTER — Encounter: Payer: Self-pay | Admitting: *Deleted

## 2022-10-25 ENCOUNTER — Telehealth: Payer: Self-pay | Admitting: Family Medicine

## 2022-10-25 DIAGNOSIS — M6281 Muscle weakness (generalized): Secondary | ICD-10-CM | POA: Diagnosis not present

## 2022-10-25 DIAGNOSIS — R2689 Other abnormalities of gait and mobility: Secondary | ICD-10-CM

## 2022-10-25 DIAGNOSIS — M5459 Other low back pain: Secondary | ICD-10-CM | POA: Diagnosis not present

## 2022-10-25 DIAGNOSIS — M25551 Pain in right hip: Secondary | ICD-10-CM

## 2022-10-25 NOTE — Telephone Encounter (Signed)
Patient got the RSV vaccine last year. Should she get it again this year.

## 2022-10-25 NOTE — Therapy (Addendum)
OUTPATIENT PHYSICAL THERAPY TREATMENT   Patient Name: Wanda Lang MRN: [id number] DOB:08-22-1950, 72 y.o., female Today's Date: 10/26/2022  END OF SESSION:  PT End of Session - 10/25/22 1502     Visit Number 17    Number of Visits 19    Date for PT Re-Evaluation 11/22/22    Authorization Type [employer] MCR    PT Start Time 1450    PT Stop Time 1535    PT Time Calculation (min) 45 min    Activity Tolerance Patient tolerated treatment well    Behavior During Therapy WFL for tasks assessed/performed                   Past Medical History:  Diagnosis Date   Allergy    Arthritis    Coronary artery disease    Fatty liver    GERD (gastroesophageal reflux disease)    Heart murmur    Hypertension    Hx   LIVER FUNCTION TESTS, ABNORMAL, HX OF 09/08/2007   Qualifier: Diagnosis of  By: Nelson-Smith CMA (AAMA), Dottie     Neuromuscular disorder (HCC)    Essential Tremor   Overactive bladder    Past Surgical History:  Procedure Laterality Date   ABDOMINAL HYSTERECTOMY     COLONOSCOPY WITH PROPOFOL N/A 09/04/2018   Procedure: COLONOSCOPY WITH PROPOFOL;  Surgeon: Pasty Spillers, MD;  Location: [facility] END[facility]COPY;  Service: Endoscopy;  Laterality: N/A;   ESOPHAGOGASTRODUODENOSCOPY (EGD) WITH PROPOFOL N/A 09/04/2018   Procedure: ESOPHAGOGASTRODUODENOSCOPY (EGD) WITH PROPOFOL;  Surgeon: Pasty Spillers, MD;  Location: [facility] ENDOSCOPY;  Service: Endoscopy;  Laterality: N/A;   LAPAROSCOPY     for endometriosis x2   TONSILLECTOMY     TOTAL HIP ARTHROPLASTY Right 05/15/2022   Procedure: RIGHT TOTAL HIP ARTHROPLASTY ANTERIOR APPROACH;  Surgeon: Kathryne Hitch, MD;  Location: [facility];  Service: Orthopedics;  Laterality: Right;   TOTAL KNEE ARTHROPLASTY Left 07/18/2021   Procedure: TOTAL KNEE ARTHROPLASTY;  Surgeon: Durene Romans, MD;  Location: [facility];  Service: Orthopedics;  Laterality: Left;   TURBINATE REDUCTION     Patient Active Problem List   Diagnosis Date  Noted   Nocturia 10/05/2022   Hypersomnia 10/05/2022   S/P total hip arthroplasty 05/17/2022   Status post total replacement of right hip 05/15/2022   Overactive bladder 04/04/2022   Postmenopausal estrogen deficiency 11/13/2021   Primary osteoarthritis 11/13/2021   Leg length inequality 11/06/2021   Impaired ambulation 10/23/2021   Contact dermatitis 10/16/2021   SI joint arthritis 08/24/2021   Spinal stenosis, lumbar region, without neurogenic claudication 08/24/2021   Degeneration of lumbar intervertebral disc 07/24/2021   S/P total knee arthroplasty, left 07/18/2021   Preop examination 07/07/2021   Right hip pain 07/07/2021   Low back pain 07/07/2021   Drug-induced immunodeficiency (HCC) 06/12/2021   Impingement syndrome of right shoulder region 06/12/2021   COVID-19 01/24/2021   Heart murmur 11/24/2020   Hammer toe 04/22/2020   Metatarsalgia of left foot 04/22/2020   Osteoarthritis of midfoot 04/22/2020   Duodenal ulcer 11/12/2019   Stress 11/12/2019   Morbid obesity (HCC) 11/06/2019   Pain in left knee 07/30/2019   Aortic stenosis, moderate 02/06/2018   Maxillary sinusitis, chronic 10/24/2017   Chronic diarrhea 10/09/2017   Acid reflux 07/15/2017   Asymptomatic carotid artery stenosis 07/15/2017   Cyst of skin 07/15/2017   Immunocompromised (HCC) 11/09/2016   Allergic rhinitis 11/02/2016   Benign essential tremor 11/02/2016   History of hypertension 11/02/2016   Postmenopausal  symptoms 07/01/2015   Seasonal allergic rhinitis due to pollen 07/01/2015   Fatty liver 09/08/2007   Rheumatoid arthritis (HCC) 09/08/2007    REFERRING PROVIDER:   Kathryne Hitch, MD    REFERRING DIAG:  R26.2 (ICD-10-CM) - Impaired ambulation  M15.9 (ICD-10-CM) - Primary osteoarthritis involving multiple joints  S/p Rt THA 05/15/22  Rationale for Evaluation and Treatment: Rehabilitation  THERAPY DIAG:  Muscle weakness (generalized)  Other abnormalities of gait and  mobility  Other low back pain  Pain in right hip  ONSET DATE: Rt THA 05/15/22, deconditioned prior to surgery   SUBJECTIVE:                                                                                                                                                                                           SUBJECTIVE STATEMENT: Pt is 5 months s/p R THA with anterior approach.  Pt states she was sore in her legs and knees after prior Rx though didn't last long.  Pt reports it takes her a day to recover from the land exercises though is improving.  Pt hasn't signed up at the [facility] yet including not performing her aquatic exercises.  Pt states she occasionally ambulates without her walking stick including to church.  Pt is able to lift leg to perform car transfers instead of picking up leg with hands.     PERTINENT HISTORY:  LLD noted prior to surgery & unable to fully correct L TKA in 2023, RA  PAIN:  NPRS scale: 0/10 / 3-4/10  Pain location: Rt anterior hip / R knee Pain description:  soreness Aggravating factors: SLS Relieving factors: knee flexed toes turned in  PRECAUTIONS:  Fall  WEIGHT BEARING RESTRICTIONS:  No  FALLS:  Has patient fallen in last 6 months? No  LIVING ENVIRONMENT: Lives with: lives alone Lives in: House/apartment Stairs: yes, also has stair lift Has following equipment at home:  walking stick   PLOF:  Independent  PATIENT GOALS:  Decrease pain, day to day stuff   OBJECTIVE:     TODAY'S TREATMENT:        Nustep at L4 x bilat UE/LE  R hip strength:  (HHD) flexion:  34.1 Abduction:  37.5  TUG:  8.29 sec 3 MWT: Prior 325 ft with walking stick / 478 ft with walking stick FOTO:  Prior /Current:  49 / 45.  Goal of 43  Bridges 2x10 Sidestepping in hallway with UE support on wall 2x10 Step up 6 inch 2 x 10 bilateral       PATIENT EDUCATION:  Education details:  exercise form/rationale, objective findings, goal progress,  POC Person educated: Patient Education method: Explanation, Demonstration, Verbal cues  Education comprehension: verbalized understanding, returned demonstration, verbal cues required  HOME EXERCISE PROGRAM: Access Code: [email or web address] URL: [email or web address] Date: 09/28/2022 Prepared by: [facility] - Outpatient Rehab - [address]  Exercises - Supine Piriformis Stretch with Foot on Ground  - 2 x daily - 7 x weekly - 1 sets - 3 reps - 30 hold - Supine Bridge with Mini Swiss Ball Between Knees  - 1 x daily - 7 x weekly - 2 sets - 10 reps - 3s hold - Step Up  - 1 x daily - 7 x weekly - 2 sets - 10 reps - Quadricep Stretch with Chair and Counter Support  - 1 x daily - 7 x weekly - 2-3 reps - 20 seconds  hold - Side Stepping with Counter Support  - 1 x daily - 7 x weekly - 2 sets - 10 reps - Standing Toe Taps  - 1 x daily - 7 x weekly - 1-2 sets - 10 reps   ASSESSMENT:  CLINICAL IMPRESSION: Pt is progressing well with function as evidenced by subjective reports and objective findings.  Pt reports improved performance of transfers.  Pt plans to attend the [employer] to perform aquatic and gym exercises though has not joined the [facility] yet.  She has not performed her aquatic exercises due to joining the [facility] yet.  Pt is performing land exercises well though does have increased fatigue reporting it typically taking a day for her to recover.  Pt demonstrated no improvement in hip flexion strength and a 2# improvement in hip abd strength.  Pt improved on TUG test from 14.51 to 8.29 sec without her walking stick.  Pt demonstrates improved ambulation distance on 3 MWT from 325 ft with walking stick prior to 478 ft with walking stick currently.  Her FOTO score decreased by 4 points though pt still met her FOTO goal.  Pt met STG#1 and LTG's #1,3,4,5.  Pt should benefit from 1-2 more visits to establish independence with land based/gym HEP and further improve strength and function.   OBJECTIVE  IMPAIRMENTS: Abnormal gait, decreased activity tolerance, decreased endurance, difficulty walking, decreased ROM, decreased strength, increased muscle spasms, improper body mechanics, postural dysfunction, and pain.   ACTIVITY LIMITATIONS: carrying, lifting, bending, sitting, squatting, stairs, transfers, bed mobility, bathing, and locomotion level  PARTICIPATION LIMITATIONS: meal prep, cleaning, laundry, shopping, community activity, occupation, yard work, and church  PERSONAL FACTORS: see PMH  REHAB POTENTIAL: Good  CLINICAL DECISION MAKING: Evolving/moderate complexity  EVALUATION COMPLEXITY: Moderate   GOALS: Goals reviewed with patient? Yes  SHORT TERM GOALS: Target date: 7/26  Able to demo proper hip ext in toe off phase of gait Baseline: Goal status: Met - 09/06/22   LONG TERM GOALS: Target date: 11/22/22  Meet FOTO goal Baseline:  Goal status: Met 09/14/22  2.  Find location to continue aquatic HEP Baseline:  Goal status: in progress 10/02/22  3.  5TSTS to 20s or less Baseline:  Goal status: Met - 09/06/22  4.  TUG to 13.5s to indicate low fall risk and proper functional ability Baseline:  8.29 sec-- 9/19 Goal status: GOAL MET   5. 3 minute walk test to 425 or > ft to demonstrate improved toleration to amb and level of mobility.    Baseline: 478 ft --9/19    Goal Status: GOAL MET    6. Pt will amb without AD safety    Baseline: walking stick  Goal status: ONGOING    7. Pt will be indep with final HEP's (land and aquatic as appropriate) for continued management of condition     Baseline: aquatic to be assigned    Goal Status: New  8. Pt will improve strength in R hip flex to at least 5lb from contralateral side to demonstrate improved overall   physical function   Baseline: see chart  Goal Status: NOT MET   PLAN:  PT FREQUENCY: 1-2 more visits  PT DURATION:  4 weeks  PLANNED INTERVENTIONS: Therapeutic exercises, Therapeutic activity, Neuromuscular  re-education, Balance training, Gait training, Patient/Family education, Self Care, Joint mobilization, Stair training, Aquatic Therapy, Dry Needling, Electrical stimulation, Spinal mobilization, Cryotherapy, Moist heat, Taping, Traction, Ionotophoresis 4mg /ml Dexamethasone, Manual therapy, and Re-evaluation.  PLAN FOR NEXT SESSION: Land: progress hip strengthening and ROM. Gait training, amb toleration.  Work on independence with land based HEP/gym program.     Audie Clear III PT, DPT 10/26/22 4:46 PM

## 2022-10-26 ENCOUNTER — Encounter (HOSPITAL_BASED_OUTPATIENT_CLINIC_OR_DEPARTMENT_OTHER): Payer: Self-pay | Admitting: Physical Therapy

## 2022-10-26 NOTE — Telephone Encounter (Signed)
Called pt and let her know RSV recommendations.  Pt verbalized understanding.

## 2022-10-27 DIAGNOSIS — G4733 Obstructive sleep apnea (adult) (pediatric): Secondary | ICD-10-CM | POA: Diagnosis not present

## 2022-10-27 DIAGNOSIS — R0602 Shortness of breath: Secondary | ICD-10-CM | POA: Diagnosis not present

## 2022-10-29 ENCOUNTER — Telehealth: Payer: Self-pay

## 2022-10-29 NOTE — Telephone Encounter (Signed)
-----   Message from [facility] sent at 10/23/2022 10:40 AM EDT ----- Regarding: PTNS Would you see if her insurance will cover the PTNS?  If it does, she wants to get scheduled for treatments.  If it doesn't, would you let me know?

## 2022-10-29 NOTE — Telephone Encounter (Signed)
PA submitted online with [employer]  Approved Authorization #[id number]  Tracking #[id number]  Dates of service 11/06/2022 - 02/05/2023 Co pay $40 for each visit x 12.  Patient advised and appointments scheduled.

## 2022-10-31 ENCOUNTER — Ambulatory Visit (HOSPITAL_BASED_OUTPATIENT_CLINIC_OR_DEPARTMENT_OTHER): Payer: Medicare PPO | Admitting: Physical Therapy

## 2022-10-31 DIAGNOSIS — M25551 Pain in right hip: Secondary | ICD-10-CM | POA: Diagnosis not present

## 2022-10-31 DIAGNOSIS — R2689 Other abnormalities of gait and mobility: Secondary | ICD-10-CM | POA: Diagnosis not present

## 2022-10-31 DIAGNOSIS — M5459 Other low back pain: Secondary | ICD-10-CM

## 2022-10-31 DIAGNOSIS — M6281 Muscle weakness (generalized): Secondary | ICD-10-CM | POA: Diagnosis not present

## 2022-10-31 NOTE — Therapy (Signed)
OUTPATIENT PHYSICAL THERAPY TREATMENT   Patient Name: Wanda Lang MRN: [id number] DOB:1950-08-24, 72 y.o., female Today's Date: 11/01/2022  END OF SESSION:  PT End of Session - 10/31/22 1457     Visit Number 18    Number of Visits 18    Date for PT Re-Evaluation 11/22/22    Authorization Type [employer] MCR    PT Start Time 1454    PT Stop Time 1536    PT Time Calculation (min) 42 min    Activity Tolerance Patient tolerated treatment well    Behavior During Therapy WFL for tasks assessed/performed                   Past Medical History:  Diagnosis Date   Allergy    Arthritis    Coronary artery disease    Fatty liver    GERD (gastroesophageal reflux disease)    Heart murmur    Hypertension    Hx   LIVER FUNCTION TESTS, ABNORMAL, HX OF 09/08/2007   Qualifier: Diagnosis of  By: Nelson-Smith CMA (AAMA), Dottie     Neuromuscular disorder (HCC)    Essential Tremor   Overactive bladder    Past Surgical History:  Procedure Laterality Date   ABDOMINAL HYSTERECTOMY     COLONOSCOPY WITH PROPOFOL N/A 09/04/2018   Procedure: COLONOSCOPY WITH PROPOFOL;  Surgeon: Pasty Spillers, MD;  Location: [facility] END[facility];  Service: Endoscopy;  Laterality: N/A;   ESOPHAGOGASTRODUODENOSCOPY (EGD) WITH PROPOFOL N/A 09/04/2018   Procedure: ESOPHAGOGASTRODUODENOSCOPY (EGD) WITH PROPOFOL;  Surgeon: Pasty Spillers, MD;  Location: [facility] END[facility];  Service: Endoscopy;  Laterality: N/A;   LAPAROSCOPY     for endometriosis x2   TONSILLECTOMY     TOTAL HIP ARTHROPLASTY Right 05/15/2022   Procedure: RIGHT TOTAL HIP ARTHROPLASTY ANTERIOR APPROACH;  Surgeon: Kathryne Hitch, MD;  Location: [facility];  Service: Orthopedics;  Laterality: Right;   TOTAL KNEE ARTHROPLASTY Left 07/18/2021   Procedure: TOTAL KNEE ARTHROPLASTY;  Surgeon: Durene Romans, MD;  Location: [facility];  Service: Orthopedics;  Laterality: Left;   TURBINATE REDUCTION     Patient Active Problem List   Diagnosis Date  Noted   Nocturia 10/05/2022   Hypersomnia 10/05/2022   S/P total hip arthroplasty 05/17/2022   Status post total replacement of right hip 05/15/2022   Overactive bladder 04/04/2022   Postmenopausal estrogen deficiency 11/13/2021   Primary osteoarthritis 11/13/2021   Leg length inequality 11/06/2021   Impaired ambulation 10/23/2021   Contact dermatitis 10/16/2021   SI joint arthritis 08/24/2021   Spinal stenosis, lumbar region, without neurogenic claudication 08/24/2021   Degeneration of lumbar intervertebral disc 07/24/2021   S/P total knee arthroplasty, left 07/18/2021   Preop examination 07/07/2021   Right hip pain 07/07/2021   Low back pain 07/07/2021   Drug-induced immunodeficiency (HCC) 06/12/2021   Impingement syndrome of right shoulder region 06/12/2021   COVID-19 01/24/2021   Heart murmur 11/24/2020   Hammer toe 04/22/2020   Metatarsalgia of left foot 04/22/2020   Osteoarthritis of midfoot 04/22/2020   Duodenal ulcer 11/12/2019   Stress 11/12/2019   Morbid obesity (HCC) 11/06/2019   Pain in left knee 07/30/2019   Aortic stenosis, moderate 02/06/2018   Maxillary sinusitis, chronic 10/24/2017   Chronic diarrhea 10/09/2017   Acid reflux 07/15/2017   Asymptomatic carotid artery stenosis 07/15/2017   Cyst of skin 07/15/2017   Immunocompromised (HCC) 11/09/2016   Allergic rhinitis 11/02/2016   Benign essential tremor 11/02/2016   History of hypertension 11/02/2016   Postmenopausal  symptoms 07/01/2015   Seasonal allergic rhinitis due to pollen 07/01/2015   Fatty liver 09/08/2007   Rheumatoid arthritis (HCC) 09/08/2007    REFERRING PROVIDER:   Kathryne Hitch, MD    REFERRING DIAG:  R26.2 (ICD-10-CM) - Impaired ambulation  M15.9 (ICD-10-CM) - Primary osteoarthritis involving multiple joints  S/p Rt THA 05/15/22  Rationale for Evaluation and Treatment: Rehabilitation  THERAPY DIAG:  Muscle weakness (generalized)  Other abnormalities of gait and  mobility  Other low back pain  Pain in right hip  ONSET DATE: Rt THA 05/15/22, deconditioned prior to surgery   SUBJECTIVE:                                                                                                                                                                                           SUBJECTIVE STATEMENT: Pt is 5.5 months s/p R THA with anterior approach.  Pt states she had increased L lumbar pain after prior Rx.  Pt states she has been stiff today and has stiffness in R knee.  Pt reports it takes her a day to recover from the land exercises though is improving.  Pt is not a member at the [facility] currently, and is planning to re-join soon.    PERTINENT HISTORY:  LLD noted prior to surgery & unable to fully correct L TKA in 2023, RA  PAIN:  NPRS scale: 0/10 / 2/10  Pain location: Rt anterior hip / lumbar Pain description:  soreness Aggravating factors: SLS Relieving factors: knee flexed toes turned in  PRECAUTIONS:  Fall  WEIGHT BEARING RESTRICTIONS:  No  FALLS:  Has patient fallen in last 6 months? No  LIVING ENVIRONMENT: Lives with: lives alone Lives in: House/apartment Stairs: yes, also has stair lift Has following equipment at home:  walking stick   PLOF:  Independent  PATIENT GOALS:  Decrease pain, day to day stuff   OBJECTIVE:     TODAY'S TREATMENT:        Nustep at L4 x 5 mins bilat UE/LE Bridges 2x15 Step up 6 inch 2 x 10 bilateral  LF: seated hip abdct, 45# 1x 10, 55# 2 x 10  LF:  leg press 40# 3x10 LF: knee ext 10# x15 reps, 15# x10 LF:  seated HS curl 10# 2x15   PT educated pt in HEP and gym program.  PT educated pt in appropriate gym exercises and instructed pt in proper set up of machines.  PT educated pt concerning appropriate resistance, sets/reps, and frequency.      PATIENT EDUCATION:  Education details:  Gym program, set up of machines, appropriate machines, resistance, and frequency; exercise form/rationale,  goal progress, POC Person educated: Patient Education method: Explanation, Demonstration, Verbal cues  Education comprehension: verbalized understanding, returned demonstration, verbal cues required  HOME EXERCISE PROGRAM: Access Code: [email or web address] URL: [email or web address] Date: 09/28/2022 Prepared by: [facility] - Outpatient Rehab - [address]  Exercises - Supine Piriformis Stretch with Foot on Ground  - 2 x daily - 7 x weekly - 1 sets - 3 reps - 30 hold - Supine Bridge with Mini Swiss Ball Between Knees  - 1 x daily - 7 x weekly - 2 sets - 10 reps - 3s hold - Step Up  - 1 x daily - 7 x weekly - 2 sets - 10 reps - Quadricep Stretch with Chair and Counter Support  - 1 x daily - 7 x weekly - 2-3 reps - 20 seconds  hold - Side Stepping with Counter Support  - 1 x daily - 7 x weekly - 2 sets - 10 reps - Standing Toe Taps  - 1 x daily - 7 x weekly - 1-2 sets - 10 reps   ASSESSMENT:  CLINICAL IMPRESSION: Pt has made good progress in PT.  She has tolerated the progression of land exercises well though does have increased fatigue with land exercises.  PT focused on education of HEP and gym exercises today.  PT educated pt with appropriate gym exercises, proper set up of machines, and appropriate resistance/sets and reps/frequency.  Pt demonstrates good understanding of gym based exercises/machines and is independent with land based HEP/appropriate gym exercises.  Pt had good tolerance with exercises.  Pt plans to attend the [employer] to perform aquatic and gym exercises though has not joined the [facility] yet.  Pt met STG#1 and LTG's #1,2,3,4,5,7.  Pt is ready for discharge and Pt agrees that she is ready for discharge.    OBJECTIVE IMPAIRMENTS: Abnormal gait, decreased activity tolerance, decreased endurance, difficulty walking, decreased ROM, decreased strength, increased muscle spasms, improper body mechanics, postural dysfunction, and pain.   ACTIVITY LIMITATIONS: carrying, lifting,  bending, sitting, squatting, stairs, transfers, bed mobility, bathing, and locomotion level  PARTICIPATION LIMITATIONS: meal prep, cleaning, laundry, shopping, community activity, occupation, yard work, and church  PERSONAL FACTORS: see PMH  REHAB POTENTIAL: Good  CLINICAL DECISION MAKING: Evolving/moderate complexity  EVALUATION COMPLEXITY: Moderate   GOALS: Goals reviewed with patient? Yes  SHORT TERM GOALS: Target date: 7/26  Able to demo proper hip ext in toe off phase of gait Baseline: Goal status: Met - 09/06/22   LONG TERM GOALS: Target date: 11/22/22  Meet FOTO goal Baseline:  Goal status: Met 09/14/22  2.  Find location to continue aquatic HEP Baseline:  Goal status: GOAL MET  3.  5TSTS to 20s or less Baseline:  Goal status: Met - 09/06/22  4.  TUG to 13.5s to indicate low fall risk and proper functional ability Baseline:  8.29 sec-- 9/19 Goal status: GOAL MET   5. 3 minute walk test to 425 or > ft to demonstrate improved toleration to amb and level of mobility.    Baseline: 478 ft --9/19    Goal Status: GOAL MET    6. Pt will amb without AD safety    Baseline: walking stick    Goal status: ONGOING    7. Pt will be indep with final HEP's (land and aquatic as appropriate) for continued management of condition     Baseline: aquatic to be assigned    Goal Status: GOAL MET  8. Pt will improve strength in R hip flex to at  least 5lb from contralateral side to demonstrate improved overall   physical function   Baseline: see chart  Goal Status: NOT MET   PLAN:   PLANNED INTERVENTIONS: Therapeutic exercises, Therapeutic activity, Neuromuscular re-education, Balance training, Gait training, Patient/Family education, Self Care, Joint mobilization, Stair training, Aquatic Therapy, Dry Needling, Electrical stimulation, Spinal mobilization, Cryotherapy, Moist heat, Taping, Traction, Ionotophoresis 4mg /ml Dexamethasone, Manual therapy, and Re-evaluation.  PLAN FOR  NEXT SESSION:  Pt to be discharged from skilled PT services due to meeting the majority of her goals and being independent with HEP.  She will cont with land based/gym exercises and aquatic HEP.  Pt is agreeable with discharge.  PHYSICAL THERAPY DISCHARGE SUMMARY  Visits from Start of Care: 18  Current functional level related to goals / functional outcomes: See above   Remaining deficits: See above   Education / Equipment: See above     Audie Clear III PT, DPT 11/01/22 10:58 PM

## 2022-11-01 ENCOUNTER — Encounter (HOSPITAL_BASED_OUTPATIENT_CLINIC_OR_DEPARTMENT_OTHER): Payer: Self-pay | Admitting: Physical Therapy

## 2022-11-02 ENCOUNTER — Telehealth: Payer: Self-pay | Admitting: Family Medicine

## 2022-11-02 NOTE — Telephone Encounter (Signed)
Pt is aware that [facility] will be reaching out to get her set up for CPAP.  She is aware that she will need an appointment with Dr. Birdie Sons 3 months after starting the CPAP.

## 2022-11-02 NOTE — Telephone Encounter (Signed)
Please let the patient know that we got her sleep study back.  It does reveal sleep apnea.  I am signing an order for a CPAP.  Somebody from [place] should be reaching out to her to get this set up.  She needs follow-up with me 3 months after getting the CPAP.

## 2022-11-02 NOTE — Telephone Encounter (Signed)
Left message to call the office back regarding the sleep study results below.

## 2022-11-08 DIAGNOSIS — I350 Nonrheumatic aortic (valve) stenosis: Secondary | ICD-10-CM | POA: Diagnosis not present

## 2022-11-08 DIAGNOSIS — I10 Essential (primary) hypertension: Secondary | ICD-10-CM | POA: Diagnosis not present

## 2022-11-08 DIAGNOSIS — R0609 Other forms of dyspnea: Secondary | ICD-10-CM | POA: Diagnosis not present

## 2022-11-08 NOTE — Progress Notes (Signed)
PTNS  Session # 1/12  Health & Social Factors: No change Caffeine: 0 Alcohol: 0 Daytime voids #per day: 5 Night-time voids #per night: 3 Urgency: Strong Incontinence Episodes #per day: 1 Ankle used: Left Treatment Setting: 9 Feeling/ Response: Sensory Comments: Patient tolerated the placement of the needle.  Performed By: Michiel Cowboy, PA-C   Follow Up: One week for PTNS # 2/12

## 2022-11-09 ENCOUNTER — Ambulatory Visit: Payer: Medicare PPO | Admitting: Urology

## 2022-11-09 DIAGNOSIS — N3946 Mixed incontinence: Secondary | ICD-10-CM | POA: Diagnosis not present

## 2022-11-13 ENCOUNTER — Ambulatory Visit: Payer: Medicare PPO | Admitting: Family Medicine

## 2022-11-14 ENCOUNTER — Telehealth: Payer: Self-pay | Admitting: Family Medicine

## 2022-11-14 ENCOUNTER — Encounter: Payer: Self-pay | Admitting: Family Medicine

## 2022-11-14 ENCOUNTER — Ambulatory Visit: Payer: Medicare PPO | Admitting: Family Medicine

## 2022-11-14 VITALS — BP 122/78 | HR 81 | Temp 97.9°F | Ht 64.5 in | Wt 211.6 lb

## 2022-11-14 DIAGNOSIS — M1990 Unspecified osteoarthritis, unspecified site: Secondary | ICD-10-CM

## 2022-11-14 DIAGNOSIS — G4733 Obstructive sleep apnea (adult) (pediatric): Secondary | ICD-10-CM

## 2022-11-14 NOTE — Assessment & Plan Note (Signed)
Chronic issue.  Discussed to my knowledge stem cell therapy is not approved by the FDA.  Discussed I would be skeptical of somebody advising they can do a stem cell infusion and them advising it would provide as much benefit as she noted they said it would.  Discussed I have heard of people having joint injections of stem cell therapy though I have no idea what the outcomes were of those as they were not my patients.  Discussed I would look a little further into this for her.  She will send me the name of the company that is doing this.  I also advised her to contact her orthopedist to get their input.

## 2022-11-14 NOTE — Telephone Encounter (Signed)
Please call [facility].  Please see if we can get a copy of her sleep study.  I cannot find this in her chart.  Please also let [facility] know that she would like to proceed with her CPAP.  If they need another order please let me know.

## 2022-11-14 NOTE — Assessment & Plan Note (Addendum)
Recent diagnosis.  I tried to track down her report in our system though it does not appear to have been scanned in yet.  Discussed my recollection indicated that she had moderate sleep apnea though we will request her sleep study report again to confirm this.  Discussed CPAP is the recommended method of treatment for sleep apnea as it provides the biggest benefit for this.  Discussed if she did not want to do the CPAP she could try dental device.  Discussed the inspire procedure is also an option.  After our discussion the patient was willing to try the CPAP and see how it went.  We will contact [facility] to let them know that she is ready to proceed with this.  Discussed that she should be able to pick out which kind of masks she would like to wear.

## 2022-11-14 NOTE — Progress Notes (Signed)
Marikay Alar, MD Phone: [phone]  Wanda Lang is a 72 y.o. female who presents today for follow-up.  OSA: Patient recently diagnosed with OSA.  She notes she wants to have a discussion regarding how severe this was and what the treatment options were.  She is hesitant to use a CPAP as she does not think she would be able to tolerate the mask.  She also is not thrilled about having to have something that she has to clean frequently.  She bought a device to help move her jaw out of the way and notes she is worn this a couple of times and it has been somewhat beneficial so far.  Osteoarthritis: Patient reports she is considering doing stem cell therapy for her chronic pains.  She notes she went to presentation where they discussed doing IV stem cells as well as joint injections with stem cells.  She wonders if this is a good idea.  She notes her insurance would not cover this.  Social History   Tobacco Use  Smoking Status Never  Smokeless Tobacco Never    Current Outpatient Medications on File Prior to Visit  Medication Sig Dispense Refill   acetaminophen (TYLENOL) 500 MG tablet Take 1,000 mg by mouth every 6 (six) hours as needed for moderate pain.     b complex vitamins capsule Take 1 capsule by mouth daily.     CALCIUM PO Take 1,200 mg by mouth daily.     celecoxib (CELEBREX) 200 MG capsule Take 200 mg by mouth daily.     cycloSPORINE (RESTASIS) 0.05 % ophthalmic emulsion Place 1 drop into both eyes 2 (two) times daily.     estradiol (VIVELLE-DOT) 0.05 MG/24HR patch Place 1 patch onto the skin 2 (two) times a week.     Flaxseed, Linseed, (FLAXSEED OIL) 1200 MG CAPS Take 1,200 mg by mouth daily.     Glucosamine HCl 1000 MG TABS Take 1,000 mg by mouth daily.     Magnesium 100 MG TABS Take 100 mg by mouth 2 (two) times a week.     Multiple Vitamin (MULTIVITAMIN WITH MINERALS) TABS tablet Take 1 tablet by mouth daily.     oxybutynin (DITROPAN XL) 15 MG 24 hr tablet Take 1 tablet  (15 mg total) by mouth daily. 30 tablet 5   Probiotic Product (PROBIOTIC DAILY PO) Take 1 Dose by mouth daily.     trolamine salicylate (BLUE-EMU HEMP) 10 % cream Apply 1 Application topically as needed for muscle pain.     Vitamin D, Ergocalciferol, (DRISDOL) 1.25 MG (50000 UNIT) CAPS capsule Take 50,000 Units by mouth every 14 (fourteen) days.     No current facility-administered medications on file prior to visit.     ROS see history of present illness  Objective  Physical Exam Vitals:   11/14/22 1305  BP: 122/78  Pulse: 81  Temp: 97.9 F (36.6 C)  SpO2: 99%    BP Readings from Last 3 Encounters:  11/14/22 122/78  10/23/22 127/76  10/05/22 122/76   Wt Readings from Last 3 Encounters:  11/14/22 211 lb 9.6 oz (96 kg)  10/05/22 206 lb 12.8 oz (93.8 kg)  09/25/22 207 lb 2 oz (94 kg)    Physical Exam Constitutional:      General: She is not in acute distress. Pulmonary:     Effort: Pulmonary effort is normal.  Neurological:     Mental Status: She is alert.      Assessment/Plan: Please see individual problem list.  OSA (obstructive sleep apnea) Assessment & Plan: Recent diagnosis.  I tried to track down her report in our system though it does not appear to have been scanned in yet.  Discussed my recollection indicated that she had moderate sleep apnea though we will request her sleep study report again to confirm this.  Discussed CPAP is the recommended method of treatment for sleep apnea as it provides the biggest benefit for this.  Discussed if she did not want to do the CPAP she could try dental device.  Discussed the inspire procedure is also an option.  After our discussion the patient was willing to try the CPAP and see how it went.  We will contact [employer] to let them know that she is ready to proceed with this.  Discussed that she should be able to pick out which kind of masks she would like to wear.   Arthritis Assessment & Plan: Chronic issue.  Discussed to  my knowledge stem cell therapy is not approved by the FDA.  Discussed I would be skeptical of somebody advising they can do a stem cell infusion and them advising it would provide as much benefit as she noted they said it would.  Discussed I have heard of people having joint injections of stem cell therapy though I have no idea what the outcomes were of those as they were not my patients.  Discussed I would look a little further into this for her.  She will send me the name of the company that is doing this.  I also advised her to contact her orthopedist to get their input.     Return if symptoms worsen or fail to improve.  I have spent 31 minutes in the care of this patient regarding history taking, documentation, completion of exam, discussion of plan.   Marikay Alar, MD [facility]

## 2022-11-15 NOTE — Telephone Encounter (Signed)
Left message with Wanda Lang about faxing over a sleep study and letting us know if they need an order for the CPAP.

## 2022-11-15 NOTE — Progress Notes (Signed)
PTNS  Session # 2/12  Health & Social Factors: No Change Caffeine: 0 Alcohol: 0 Daytime voids #per day: 5 Night-time voids #per night: 2 Urgency: mild Incontinence Episodes #per day: 1 Ankle used: Left Treatment Setting: 9 Feeling/ Response: Sensory Comments: Patient tolerated well.   Performed By: Michiel Cowboy, PA-C   Follow Up: One week for #3/12 PTNS

## 2022-11-16 ENCOUNTER — Encounter: Payer: Self-pay | Admitting: Urology

## 2022-11-16 ENCOUNTER — Telehealth: Payer: Self-pay | Admitting: Family Medicine

## 2022-11-16 ENCOUNTER — Ambulatory Visit: Payer: Medicare PPO | Admitting: Urology

## 2022-11-16 VITALS — BP 155/87 | HR 73

## 2022-11-16 DIAGNOSIS — N3946 Mixed incontinence: Secondary | ICD-10-CM

## 2022-11-16 NOTE — Telephone Encounter (Signed)
Patients would like to relay to provider the stem cell company they spoke about previously. The name is [employer].  Phone number is [phone].

## 2022-11-16 NOTE — Telephone Encounter (Signed)
The patient know that her sleep study revealed severe sleep apnea.  I would recommend proceeding with CPAP as we discussed.

## 2022-11-16 NOTE — Telephone Encounter (Signed)
Can you find a CPAP order form for [employer]?  I will need the order form so I can fill it out.  Thanks.

## 2022-11-16 NOTE — Telephone Encounter (Signed)
Please send a CPAP order .

## 2022-11-16 NOTE — Telephone Encounter (Signed)
Left message to call the office back regarding the sleep study message below.

## 2022-11-19 DIAGNOSIS — M0589 Other rheumatoid arthritis with rheumatoid factor of multiple sites: Secondary | ICD-10-CM | POA: Diagnosis not present

## 2022-11-21 NOTE — Progress Notes (Signed)
PTNS  Session # 3/12  Health & Social Factors: No change Caffeine: 1 Alcohol: 0 Daytime voids #per day: 5 Night-time voids #per night: 3 Urgency: Strong Incontinence Episodes #per day: 1 Ankle used: Left Treatment Setting: 7 Feeling/ Response: Sensory  Comments: Patient tolerated procedure   Performed By: Michiel Cowboy, PA-C   Follow Up: Michiel Cowboy, PA-C

## 2022-11-22 NOTE — Telephone Encounter (Signed)
Noted. I looked in to what this company offers. I would not recommend she proceed with this based on what I could gather from their website. Though I would recommend she discuss this with her orthopedist to get their input on it as they likely have more knowledge on the use of this kind of treatment for her orthopedic issues.

## 2022-11-22 NOTE — Telephone Encounter (Signed)
Spoke to Patient and she states she appreciates your input and she will discuss with her orthopedic provider.

## 2022-11-23 ENCOUNTER — Ambulatory Visit: Payer: Medicare PPO | Admitting: Urology

## 2022-11-23 DIAGNOSIS — N3946 Mixed incontinence: Secondary | ICD-10-CM

## 2022-11-23 NOTE — Patient Instructions (Signed)

## 2022-11-23 NOTE — Telephone Encounter (Signed)
Signed. Please fax.

## 2022-11-27 NOTE — Progress Notes (Signed)
PTNS  Session # 4  Health & Social Factors: No change Caffeine: 2 Alcohol: 0 Daytime voids #per day: 8 Night-time voids #per night: 4 Urgency: mild Incontinence Episodes #per day: 1 Ankle used: Left Treatment Setting: 14 Feeling/ Response: Sensory Comments: Patient tolerated   Performed By: Michiel Cowboy, PA-C  Follow Up: One week for # 5 PTNS

## 2022-11-28 NOTE — Telephone Encounter (Signed)
Has this been taken care of?  If so, please document and close encounter

## 2022-11-29 DIAGNOSIS — M533 Sacrococcygeal disorders, not elsewhere classified: Secondary | ICD-10-CM | POA: Diagnosis not present

## 2022-11-29 DIAGNOSIS — Z79899 Other long term (current) drug therapy: Secondary | ICD-10-CM | POA: Diagnosis not present

## 2022-11-29 DIAGNOSIS — M5136 Other intervertebral disc degeneration, lumbar region with discogenic back pain only: Secondary | ICD-10-CM | POA: Diagnosis not present

## 2022-11-29 DIAGNOSIS — Z5181 Encounter for therapeutic drug level monitoring: Secondary | ICD-10-CM | POA: Diagnosis not present

## 2022-11-29 DIAGNOSIS — M5416 Radiculopathy, lumbar region: Secondary | ICD-10-CM | POA: Diagnosis not present

## 2022-11-30 ENCOUNTER — Encounter: Payer: Self-pay | Admitting: Urology

## 2022-11-30 ENCOUNTER — Ambulatory Visit: Payer: Medicare PPO | Admitting: Urology

## 2022-11-30 VITALS — Ht 64.0 in | Wt 211.0 lb

## 2022-11-30 DIAGNOSIS — N3946 Mixed incontinence: Secondary | ICD-10-CM

## 2022-12-03 DIAGNOSIS — Z79899 Other long term (current) drug therapy: Secondary | ICD-10-CM | POA: Diagnosis not present

## 2022-12-03 DIAGNOSIS — G4733 Obstructive sleep apnea (adult) (pediatric): Secondary | ICD-10-CM | POA: Diagnosis not present

## 2022-12-03 DIAGNOSIS — M150 Primary generalized (osteo)arthritis: Secondary | ICD-10-CM | POA: Diagnosis not present

## 2022-12-03 DIAGNOSIS — M0589 Other rheumatoid arthritis with rheumatoid factor of multiple sites: Secondary | ICD-10-CM | POA: Diagnosis not present

## 2022-12-03 DIAGNOSIS — M1710 Unilateral primary osteoarthritis, unspecified knee: Secondary | ICD-10-CM | POA: Diagnosis not present

## 2022-12-03 DIAGNOSIS — M2140 Flat foot [pes planus] (acquired), unspecified foot: Secondary | ICD-10-CM | POA: Diagnosis not present

## 2022-12-03 DIAGNOSIS — M549 Dorsalgia, unspecified: Secondary | ICD-10-CM | POA: Diagnosis not present

## 2022-12-03 DIAGNOSIS — M25511 Pain in right shoulder: Secondary | ICD-10-CM | POA: Diagnosis not present

## 2022-12-04 NOTE — Progress Notes (Signed)
PTNS  Session # 5  Health & Social Factors: No change Caffeine: 0 Alcohol: 0 Daytime voids #per day: 5 Night-time voids #per night: 2 Urgency: Stronge Incontinence Episodes #per day: 2 Ankle used: Left Treatment Setting: 10 Feeling/ Response: Sensory Comments: She is now sleeping with a CPAP machine has noticed a decrease in her nocturia.  She tolerated the placement of the needle fine  Performed By: Michiel Cowboy, PA-C   Follow Up: 1 week for #6 PTNS

## 2022-12-06 NOTE — Telephone Encounter (Signed)
Forms where faxed via Morrie Sheldon

## 2022-12-07 ENCOUNTER — Ambulatory Visit: Payer: Medicare PPO | Admitting: Urology

## 2022-12-07 DIAGNOSIS — N3946 Mixed incontinence: Secondary | ICD-10-CM | POA: Diagnosis not present

## 2022-12-07 NOTE — Patient Instructions (Signed)

## 2022-12-12 NOTE — Progress Notes (Signed)
PTNS  Session # 6  Health & Social Factors: No change  Caffeine: 1 Alcohol: 0 Daytime voids #per day: 8 Night-time voids #per night: 3 Urgency: mild Incontinence Episodes #per day: 1 Ankle used: Left Treatment Setting: 14 Feeling/ Response: Sensory Comments: Tolerated procedure   Performed By: Michiel Cowboy, PA-C   Follow Up: One week for # 7 PTNS

## 2022-12-14 ENCOUNTER — Ambulatory Visit: Payer: Medicare PPO | Admitting: Urology

## 2022-12-14 DIAGNOSIS — N3946 Mixed incontinence: Secondary | ICD-10-CM

## 2022-12-17 DIAGNOSIS — M0589 Other rheumatoid arthritis with rheumatoid factor of multiple sites: Secondary | ICD-10-CM | POA: Diagnosis not present

## 2022-12-18 DIAGNOSIS — M5416 Radiculopathy, lumbar region: Secondary | ICD-10-CM | POA: Diagnosis not present

## 2022-12-19 NOTE — Progress Notes (Signed)
PTNS  Session # 7  Health & Social Factors: no change Caffeine: 1 Alcohol: 0 Daytime voids #per day: 7 Night-time voids #per night: 2-3 Urgency: strong Incontinence Episodes #per day: 1 Ankle used: left Treatment Setting: 12 Feeling/ Response: sensory Comments:   Performed By: Alvina Chou  Follow Up: 1 week for #8 PTNS

## 2022-12-21 ENCOUNTER — Ambulatory Visit: Payer: Medicare PPO | Admitting: Urology

## 2022-12-21 DIAGNOSIS — N3946 Mixed incontinence: Secondary | ICD-10-CM | POA: Diagnosis not present

## 2022-12-26 NOTE — Progress Notes (Signed)
PTNS  Session # 8  Health & Social Factors: No change  Caffeine: 0 Alcohol: 0 Daytime voids #per day: 8 Night-time voids #per night: 1 Urgency: mild Incontinence Episodes #per day: 1 Ankle used: Left  Treatment Setting: 7 Feeling/ Response: Sensory Comments: Patient tolerated procedure well  Performed By: Michiel Cowboy, PA-C   Follow Up: 1 week for PTNS #9

## 2022-12-28 ENCOUNTER — Ambulatory Visit: Payer: Medicare PPO | Admitting: Urology

## 2022-12-28 DIAGNOSIS — N3946 Mixed incontinence: Secondary | ICD-10-CM

## 2022-12-28 NOTE — Patient Instructions (Signed)

## 2022-12-31 DIAGNOSIS — M533 Sacrococcygeal disorders, not elsewhere classified: Secondary | ICD-10-CM | POA: Diagnosis not present

## 2022-12-31 NOTE — Progress Notes (Unsigned)
PTNS  Session # 9  Health & Social Factors: No change Caffeine: 0 Alcohol: 0 Daytime voids #per day: 3 Night-time voids #per night: 1 Urgency: Mild Incontinence Episodes #per day: 1 Ankle used: Left Treatment Setting: 2 Feeling/ Response: Sensory  Comments: Patient tolerated the procedure  Performed By: Michiel Cowboy, PA-C   Follow Up: 1 week for number 10 out of 12 PTNS

## 2023-01-02 ENCOUNTER — Ambulatory Visit: Payer: Medicare PPO | Admitting: Urology

## 2023-01-02 DIAGNOSIS — N3946 Mixed incontinence: Secondary | ICD-10-CM | POA: Diagnosis not present

## 2023-01-03 DIAGNOSIS — G4733 Obstructive sleep apnea (adult) (pediatric): Secondary | ICD-10-CM | POA: Diagnosis not present

## 2023-01-11 ENCOUNTER — Ambulatory Visit: Payer: Medicare PPO | Admitting: Physician Assistant

## 2023-01-16 DIAGNOSIS — M533 Sacrococcygeal disorders, not elsewhere classified: Secondary | ICD-10-CM | POA: Diagnosis not present

## 2023-01-17 DIAGNOSIS — M0589 Other rheumatoid arthritis with rheumatoid factor of multiple sites: Secondary | ICD-10-CM | POA: Diagnosis not present

## 2023-01-18 ENCOUNTER — Ambulatory Visit: Payer: Medicare PPO | Admitting: Physician Assistant

## 2023-01-24 NOTE — Progress Notes (Deleted)
PTNS  Session # 10  Health & Social Factors: *** Caffeine: *** Alcohol: *** Daytime voids #per day: *** Night-time voids #per night: *** Urgency: *** Incontinence Episodes #per day: *** Ankle used: *** Treatment Setting: *** Feeling/ Response: *** Comments: ***  Performed By: ***  Follow Up: ***

## 2023-01-25 ENCOUNTER — Ambulatory Visit: Payer: Medicare PPO | Admitting: Urology

## 2023-01-25 DIAGNOSIS — N3946 Mixed incontinence: Secondary | ICD-10-CM

## 2023-01-28 ENCOUNTER — Ambulatory Visit
Admission: EM | Admit: 2023-01-28 | Discharge: 2023-01-28 | Disposition: A | Discharge status: Home / Self Care | Payer: Medicare PPO

## 2023-01-28 ENCOUNTER — Ambulatory Visit: Payer: Medicare PPO | Admitting: Family

## 2023-01-28 DIAGNOSIS — J069 Acute upper respiratory infection, unspecified: Secondary | ICD-10-CM

## 2023-01-28 NOTE — ED Provider Notes (Signed)
Wanda Lang    CSN: [id number] Arrival date & time: 01/28/23  1018      History   Chief Complaint Chief Complaint  Patient presents with   URI    HPI [name] is a 72 y.o. female.  Patient presents with 3-day history of congestion and cough.  No fever or shortness of breath.  No OTC medication taken today.  Her medical history includes rheumatoid arthritis, hypertension.  The history is provided by the patient and medical records.    Past Medical History:  Diagnosis Date   Allergy    Arthritis    Coronary artery disease    Fatty liver    GERD (gastroesophageal reflux disease)    Heart murmur    Hypertension    Hx   LIVER FUNCTION TESTS, ABNORMAL, HX OF 09/08/2007   Qualifier: Diagnosis of  By: Nelson-Smith CMA (AAMA), Dottie     Neuromuscular disorder [facility])    Essential Tremor   Overactive bladder     Patient Active Problem List   Diagnosis Date Noted   OSA (obstructive sleep apnea) 11/14/2022   Arthritis 11/14/2022   Nocturia 10/05/2022   Hypersomnia 10/05/2022   S/P total hip arthroplasty 05/17/2022   Status post total replacement of right hip 05/15/2022   Overactive bladder 04/04/2022   Postmenopausal estrogen deficiency 11/13/2021   Primary osteoarthritis 11/13/2021   Leg length inequality 11/06/2021   Impaired ambulation 10/23/2021   Contact dermatitis 10/16/2021   SI joint arthritis (HCC) 08/24/2021   Spinal stenosis, lumbar region, without neurogenic claudication 08/24/2021   Degeneration of lumbar intervertebral disc 07/24/2021   S/P total knee arthroplasty, left 07/18/2021   Preop examination 07/07/2021   Right hip pain 07/07/2021   Low back pain 07/07/2021   Drug-induced immunodeficiency (HCC) 06/12/2021   Impingement syndrome of right shoulder region 06/12/2021   COVID-19 01/24/2021   Heart murmur 11/24/2020   Hammer toe 04/22/2020   Metatarsalgia of left foot 04/22/2020   Osteoarthritis of midfoot 04/22/2020   Duodenal  ulcer 11/12/2019   Stress 11/12/2019   Morbid obesity (HCC) 11/06/2019   Pain in left knee 07/30/2019   Aortic stenosis, moderate 02/06/2018   Chronic diarrhea 10/09/2017   Acid reflux 07/15/2017   Asymptomatic carotid artery stenosis 07/15/2017   Cyst of skin 07/15/2017   Immunocompromised (HCC) 11/09/2016   Allergic rhinitis 11/02/2016   Benign essential tremor 11/02/2016   History of hypertension 11/02/2016   Postmenopausal symptoms 07/01/2015   Fatty liver 09/08/2007   Rheumatoid arthritis (HCC) 09/08/2007    Past Surgical History:  Procedure Laterality Date   ABDOMINAL HYSTERECTOMY     COLONOSCOPY WITH PROPOFOL N/A 09/04/2018   Procedure: COLONOSCOPY WITH PROPOFOL;  Surgeon: Pasty Spillers, MD;  Location: [facility] ENDOSCOPY;  Service: Endoscopy;  Laterality: N/A;   ESOPHAGOGASTRODUODENOSCOPY (EGD) WITH PROPOFOL N/A 09/04/2018   Procedure: ESOPHAGOGASTRODUODENOSCOPY (EGD) WITH PROPOFOL;  Surgeon: Pasty Spillers, MD;  Location: [facility] ENDOSCOPY;  Service: Endoscopy;  Laterality: N/A;   LAPAROSCOPY     for endometriosis x2   TONSILLECTOMY     TOTAL HIP ARTHROPLASTY Right 05/15/2022   Procedure: RIGHT TOTAL HIP ARTHROPLASTY ANTERIOR APPROACH;  Surgeon: Kathryne Hitch, MD;  Location: [facility] OR;  Service: Orthopedics;  Laterality: Right;   TOTAL KNEE ARTHROPLASTY Left 07/18/2021   Procedure: TOTAL KNEE ARTHROPLASTY;  Surgeon: Durene Romans, MD;  Location: [facility] ORS;  Service: Orthopedics;  Laterality: Left;   TURBINATE REDUCTION      OB History   No obstetric  history on file.      Home Medications    Prior to Admission medications   Medication Sig Start Date End Date Taking? Authorizing Provider  acetaminophen (TYLENOL) 500 MG tablet Take 1,000 mg by mouth every 6 (six) hours as needed for moderate pain.    [provider]  b complex vitamins capsule Take 1 capsule by mouth daily.    [provider]  CALCIUM PO Take 1,200 mg by mouth daily.     [provider]  celecoxib (CELEBREX) 200 MG capsule Take 200 mg by mouth daily.    [provider]  cycloSPORINE (RESTASIS) 0.05 % ophthalmic emulsion Place 1 drop into both eyes 2 (two) times daily.    [provider]  estradiol (VIVELLE-DOT) 0.05 MG/24HR patch Place 1 patch onto the skin 2 (two) times a week.    [provider]  Flaxseed, Linseed, (FLAXSEED OIL) 1200 MG CAPS Take 1,200 mg by mouth daily.    [provider]  Glucosamine HCl 1000 MG TABS Take 1,000 mg by mouth daily.    [provider]  Magnesium 100 MG TABS Take 100 mg by mouth 2 (two) times a week.    [provider]  Multiple Vitamin (MULTIVITAMIN WITH MINERALS) TABS tablet Take 1 tablet by mouth daily.    [provider]  oxybutynin (DITROPAN XL) 15 MG 24 hr tablet Take 1 tablet (15 mg total) by mouth daily. Patient not taking: Reported on 01/28/2023 09/25/22   Wanda Scotland, MD  predniSONE (DELTASONE) 5 MG tablet SMARTSIG:- Tablet(s) By Mouth - Patient not taking: Reported on 01/28/2023 11/30/22   [provider]  Probiotic Product (PROBIOTIC DAILY PO) Take 1 Dose by mouth daily.    [provider]  trolamine salicylate (BLUE-EMU HEMP) 10 % cream Apply 1 Application topically as needed for muscle pain.    [provider]  Vitamin D, Ergocalciferol, (DRISDOL) 1.25 MG (50000 UNIT) CAPS capsule Take 50,000 Units by mouth every 14 (fourteen) days. 07/06/22   [provider]    Family History Family History  Problem Relation Age of Onset   Alcohol abuse Mother    Arthritis Mother    Hyperlipidemia Mother    Heart disease Mother    Stroke Mother    Hypertension Mother    Depression Mother    Anxiety disorder Mother    Arthritis Father    Lung cancer Paternal Grandfather    Kidney disease Paternal Grandfather     Social History Social History   Tobacco Use   Smoking status: Never    Passive exposure: Never    Smokeless tobacco: Never  Vaping Use   Vaping status: Never Used  Substance Use Topics   Alcohol use: Not Currently    Alcohol/week: 1.0 standard drink of alcohol    Types: 1 Glasses of wine per week   Drug use: No     Allergies   Penicillins, Amoxicillin, Misc. sulfonamide containing compounds, Rosanil cleanser [sulfacetamide sodium-sulfur], Sulfonamide derivatives, and Ylang-ylang [cananga oil (ylang-ylang)]   Review of Systems Review of Systems  Constitutional:  Negative for chills and fever.  HENT:  Positive for congestion. Negative for ear pain and sore throat.   Respiratory:  Positive for cough. Negative for shortness of breath.      Physical Exam Triage Vital Signs ED Triage Vitals  Encounter Vitals Group     BP 01/28/23 1136 (!) 140/85     Systolic BP Percentile --  Diastolic BP Percentile --      Pulse Rate 01/28/23 1121 (!) 104     Resp 01/28/23 1121 18     Temp 01/28/23 1121 98.1 F (36.7 C)     Temp src --      SpO2 01/28/23 1121 97 %     Weight --      Height --      Head Circumference --      Peak Flow --      Pain Score 01/28/23 1123 1     Pain Loc --      Pain Education --      Exclude from Growth Chart --    No data found.  Updated Vital Signs BP (!) 140/85   Pulse (!) 104   Temp 98.1 F (36.7 C)   Resp 18   SpO2 97%   Visual Acuity Right Eye Distance:   Left Eye Distance:   Bilateral Distance:    Right Eye Near:   Left Eye Near:    Bilateral Near:     Physical Exam Constitutional:      General: She is not in acute distress. HENT:     Right Ear: Tympanic membrane normal.     Left Ear: Tympanic membrane normal.     Nose: Nose normal.     Mouth/Throat:     Mouth: Mucous membranes are moist.     Pharynx: Oropharynx is clear.     Comments: Clear PND. Cardiovascular:     Rate and Rhythm: Normal rate and regular rhythm.     Heart sounds: Normal heart sounds.  Pulmonary:     Effort: Pulmonary effort is normal. No  respiratory distress.     Breath sounds: Normal breath sounds.  Neurological:     Mental Status: She is alert.      UC Treatments / Results  Labs (all labs ordered are listed, but only abnormal results are displayed) Labs Reviewed - No data to display  EKG   Radiology No results found.  Procedures Procedures (including critical care time)  Medications Ordered in UC Medications - No data to display  Initial Impression / Assessment and Plan / UC Course  I have reviewed the triage vital signs and the nursing notes.  Pertinent labs & imaging results that were available during my care of the patient were reviewed by me and considered in my medical decision making (see chart for details).    Viral URI.  Patient declines COVID and flu testing.  Discussed symptomatic treatment including Tylenol as needed for fever or discomfort, plain Mucinex as needed for congestion, rest, hydration.  Instructed patient to follow-up with her PCP if not improving. She agrees to plan of care.   Final Clinical Impressions(s) / UC Diagnoses   Final diagnoses:  Viral URI     Discharge Instructions      Take Tylenol as needed for fever or discomfort.  Take plain Mucinex as needed for congestion.  Rest and keep yourself hydrated.    Follow-up with your primary care provider if your symptoms are not improving.         ED Prescriptions   None    PDMP not reviewed this encounter.   Mickie Bail, NP 01/28/23 1155

## 2023-01-28 NOTE — ED Triage Notes (Addendum)
Patient to Urgent Care with complaints of nasal and sinus drainage. Productive cough w/ clear mucus. Denies any known fevers.   Symptoms started 3 days ago.   Recently started using a cpap.

## 2023-01-28 NOTE — Discharge Instructions (Addendum)
Take Tylenol as needed for fever or discomfort.  Take plain Mucinex as needed for congestion.  Rest and keep yourself hydrated.    Follow-up with your primary care provider if your symptoms are not improving.

## 2023-02-01 ENCOUNTER — Telehealth: Payer: Self-pay

## 2023-02-01 NOTE — Telephone Encounter (Signed)
Copied from CRM [id number]. Topic: Clinical - Medical Advice >> Feb 01, 2023  9:49 AM [name] wrote: Reason for CRM: Patient called to advise that she has been ill since Sunday. She noticed a tickle/sore throat. Patient advised that she always has a little congestion this time of year but she woke up Monday morning with intense, sudden congestion. Patient went to urgent care and was told to ride out viral symptoms as presented. Patient is on immunosuppressants due to rheumatoid arthritis, so while she got better for a few days, congestion has returned more intensely and she is feeling worse than she had before. Patient is scheduled to see Dr. Birdie Sons on Monday. Please advise of any suggestions for relief over the weekend. Please assist.

## 2023-02-01 NOTE — Telephone Encounter (Signed)
Called Patient with Dr. Purvis Sheffield recommendations and the Patient is agreeable.

## 2023-02-01 NOTE — Telephone Encounter (Signed)
She could try adding claritin and flonase over the counter to see if those would be beneficial.

## 2023-02-02 DIAGNOSIS — G4733 Obstructive sleep apnea (adult) (pediatric): Secondary | ICD-10-CM | POA: Diagnosis not present

## 2023-02-04 ENCOUNTER — Telehealth: Payer: Self-pay | Admitting: Family Medicine

## 2023-02-04 ENCOUNTER — Ambulatory Visit: Payer: Medicare PPO | Admitting: Family Medicine

## 2023-02-04 ENCOUNTER — Encounter: Payer: Self-pay | Admitting: Family Medicine

## 2023-02-04 VITALS — BP 122/82 | HR 104 | Temp 97.7°F | Ht 64.0 in | Wt 229.0 lb

## 2023-02-04 DIAGNOSIS — J0140 Acute pansinusitis, unspecified: Secondary | ICD-10-CM | POA: Diagnosis not present

## 2023-02-04 DIAGNOSIS — I350 Nonrheumatic aortic (valve) stenosis: Secondary | ICD-10-CM | POA: Diagnosis not present

## 2023-02-04 DIAGNOSIS — G4733 Obstructive sleep apnea (adult) (pediatric): Secondary | ICD-10-CM | POA: Diagnosis not present

## 2023-02-04 MED ORDER — DOXYCYCLINE HYCLATE 100 MG PO TABS
100.0000 mg | ORAL_TABLET | Freq: Two times a day (BID) | ORAL | 0 refills | Status: DC
Start: 2023-02-04 — End: 2023-02-22

## 2023-02-04 NOTE — Assessment & Plan Note (Signed)
Patient symptoms are consistent with sinusitis.  Start on doxycycline 1 tablet twice daily for 7 days.  Discussed risk of diarrhea and skin sensitivity to the sun with this.  If she develops excessive diarrhea she will let us know.  If not improving by the end of the week she will contact us.

## 2023-02-04 NOTE — Telephone Encounter (Signed)
Please request a compliance report from [employer] on this patient CPAP.  Thanks.

## 2023-02-04 NOTE — Patient Instructions (Signed)
Please make sure you have an appointment with cardiology some time during the spring.

## 2023-02-04 NOTE — Assessment & Plan Note (Signed)
Chronic issue.  Murmur on exam.  She will continue to follow with cardiology.  I encouraged her to make sure she has an appointment in the spring with them.

## 2023-02-04 NOTE — Assessment & Plan Note (Signed)
Chronic issue.  Symptomatically improving.  She request referral to specialist given continued sleep issues.  Referred to pulmonology.  She will continue her CPAP.  We will request a compliance report.

## 2023-02-04 NOTE — Progress Notes (Signed)
Marikay Alar, MD Phone: [phone]  Wanda Lang is a 72 y.o. female who presents today for follow-up.  OSA: Using her CPAP.  She reports AHI's of 0.5-1.5.  Notes hypersomnia has improved some.  Notes she is waking up better rested.  Sinus congestion: Patient reports postnasal drip and congestion.  Notes onset about 10 days ago.  Initially got better though then got worse.  No fevers.  She is been using over-the-counter allergy medication, Mucinex, and cough medication as well as occasional Tylenol.  Aortic stenosis: Patient follows with cardiology for this.  Noted to be moderate on prior echo.  She saw cardiology in October and they recommended 56-month follow-up with echo at that time.  Social History   Tobacco Use  Smoking Status Never   Passive exposure: Never  Smokeless Tobacco Never    Current Outpatient Medications on File Prior to Visit  Medication Sig Dispense Refill   acetaminophen (TYLENOL) 500 MG tablet Take 1,000 mg by mouth every 6 (six) hours as needed for moderate pain.     b complex vitamins capsule Take 1 capsule by mouth daily.     CALCIUM PO Take 1,200 mg by mouth daily.     celecoxib (CELEBREX) 200 MG capsule Take 200 mg by mouth daily.     cycloSPORINE (RESTASIS) 0.05 % ophthalmic emulsion Place 1 drop into both eyes 2 (two) times daily.     estradiol (VIVELLE-DOT) 0.05 MG/24HR patch Place 1 patch onto the skin 2 (two) times a week.     Flaxseed, Linseed, (FLAXSEED OIL) 1200 MG CAPS Take 1,200 mg by mouth daily.     Glucosamine HCl 1000 MG TABS Take 1,000 mg by mouth daily.     Magnesium 100 MG TABS Take 100 mg by mouth 2 (two) times a week.     Multiple Vitamin (MULTIVITAMIN WITH MINERALS) TABS tablet Take 1 tablet by mouth daily.     oxybutynin (DITROPAN XL) 15 MG 24 hr tablet Take 1 tablet (15 mg total) by mouth daily. 30 tablet 5   predniSONE (DELTASONE) 5 MG tablet      Probiotic Product (PROBIOTIC DAILY PO) Take 1 Dose by mouth daily.      trolamine salicylate (BLUE-EMU HEMP) 10 % cream Apply 1 Application topically as needed for muscle pain.     Vitamin D, Ergocalciferol, (DRISDOL) 1.25 MG (50000 UNIT) CAPS capsule Take 50,000 Units by mouth every 14 (fourteen) days.     No current facility-administered medications on file prior to visit.     ROS see history of present illness  Objective  Physical Exam Vitals:   02/04/23 0934 02/04/23 0947  BP: 120/82 122/82  Pulse: (!) 104   Temp: 97.7 F (36.5 C)   SpO2: 98%     BP Readings from Last 3 Encounters:  02/04/23 122/82  01/28/23 (!) 140/85  11/16/22 (!) 155/87   Wt Readings from Last 3 Encounters:  02/04/23 229 lb (103.9 kg)  11/30/22 211 lb (95.7 kg)  11/14/22 211 lb 9.6 oz (96 kg)    Physical Exam Constitutional:      General: She is not in acute distress.    Appearance: She is not diaphoretic.  Cardiovascular:     Rate and Rhythm: Normal rate and regular rhythm.     Heart sounds: Murmur (3/6 systolic murmur right upper sternal border) heard.  Pulmonary:     Effort: Pulmonary effort is normal.     Breath sounds: Normal breath sounds.  Skin:  General: Skin is warm and dry.  Neurological:     Mental Status: She is alert.      Assessment/Plan: Please see individual problem list.  OSA (obstructive sleep apnea) Assessment & Plan: Chronic issue.  Symptomatically improving.  She request referral to specialist given continued sleep issues.  Referred to pulmonology.  She will continue her CPAP.  We will request a compliance report.  Orders: -     Ambulatory referral to Pulmonology  Acute non-recurrent pansinusitis Assessment & Plan: Patient symptoms are consistent with sinusitis.  Start on doxycycline 1 tablet twice daily for 7 days.  Discussed risk of diarrhea and skin sensitivity to the sun with this.  If she develops excessive diarrhea she will let us know.  If not improving by the end of the week she will contact us.  Orders: -      Doxycycline Hyclate; Take 1 tablet (100 mg total) by mouth 2 (two) times daily.  Dispense: 14 tablet; Refill: 0  Aortic stenosis, moderate Assessment & Plan: Chronic issue.  Murmur on exam.  She will continue to follow with cardiology.  I encouraged her to make sure she has an appointment in the spring with them.      Health Maintenance: patient reports she gets her mammograms through GYN in [place]. She is to call to get this set up.   Return in about 6 months (around 08/05/2023) for transfer of care.   Marikay Alar, MD [facility]

## 2023-02-05 DIAGNOSIS — G4733 Obstructive sleep apnea (adult) (pediatric): Secondary | ICD-10-CM | POA: Diagnosis not present

## 2023-02-05 NOTE — Telephone Encounter (Signed)
Called and requested a CPAP compliance report be faxed to [place].

## 2023-02-07 ENCOUNTER — Telehealth: Payer: Self-pay | Admitting: Family Medicine

## 2023-02-07 NOTE — Telephone Encounter (Signed)
 Please let the patient know that her compliance report reveals that her CPAP is working adequately.  Thanks.

## 2023-02-08 ENCOUNTER — Ambulatory Visit: Payer: Medicare PPO | Admitting: Physician Assistant

## 2023-02-08 ENCOUNTER — Encounter: Payer: Self-pay | Admitting: Internal Medicine

## 2023-02-08 DIAGNOSIS — N3281 Overactive bladder: Secondary | ICD-10-CM | POA: Diagnosis not present

## 2023-02-08 NOTE — Progress Notes (Signed)
 PTNS  Session # 10  Health & Social Factors: no change Caffeine: 0 Alcohol: 0 Daytime voids #per day: 6 Night-time voids #per night: 2 Urgency: strong Incontinence Episodes #per day: 2-3 Ankle used: left Treatment Setting: 19 Feeling/ Response: sensory Comments: rescheduled the next 2 PTNS since patient missed 2 sessions.  Performed By: Don DEL RMA  Follow Up: 1 week.

## 2023-02-11 DIAGNOSIS — M9903 Segmental and somatic dysfunction of lumbar region: Secondary | ICD-10-CM | POA: Diagnosis not present

## 2023-02-11 DIAGNOSIS — M9901 Segmental and somatic dysfunction of cervical region: Secondary | ICD-10-CM | POA: Diagnosis not present

## 2023-02-11 DIAGNOSIS — M9904 Segmental and somatic dysfunction of sacral region: Secondary | ICD-10-CM | POA: Diagnosis not present

## 2023-02-11 DIAGNOSIS — M9902 Segmental and somatic dysfunction of thoracic region: Secondary | ICD-10-CM | POA: Diagnosis not present

## 2023-02-13 NOTE — Progress Notes (Signed)
 PTNS  Session # 11  Health & Social Factors: pt states she is worse with frequency Caffeine: 0 Alcohol: 0 Daytime voids #per day: 10 Night-time voids #per night: 2 Urgency: strong Incontinence Episodes #per day: 0-1 Ankle used: left Treatment Setting: 19 Feeling/ Response: Sensory  Comments: Patient tolerated procedure well  Performed By: CLOTILDA CORNWALL, PA-C   Follow Up: One week for # 12/12

## 2023-02-14 DIAGNOSIS — M9901 Segmental and somatic dysfunction of cervical region: Secondary | ICD-10-CM | POA: Diagnosis not present

## 2023-02-14 DIAGNOSIS — M9904 Segmental and somatic dysfunction of sacral region: Secondary | ICD-10-CM | POA: Diagnosis not present

## 2023-02-14 DIAGNOSIS — M9903 Segmental and somatic dysfunction of lumbar region: Secondary | ICD-10-CM | POA: Diagnosis not present

## 2023-02-14 DIAGNOSIS — M9902 Segmental and somatic dysfunction of thoracic region: Secondary | ICD-10-CM | POA: Diagnosis not present

## 2023-02-15 ENCOUNTER — Ambulatory Visit: Payer: Medicare PPO | Admitting: Family Medicine

## 2023-02-15 ENCOUNTER — Encounter: Payer: Self-pay | Admitting: Urology

## 2023-02-15 ENCOUNTER — Ambulatory Visit: Payer: Medicare PPO | Admitting: Urology

## 2023-02-15 DIAGNOSIS — N3281 Overactive bladder: Secondary | ICD-10-CM | POA: Diagnosis not present

## 2023-02-18 DIAGNOSIS — M9904 Segmental and somatic dysfunction of sacral region: Secondary | ICD-10-CM | POA: Diagnosis not present

## 2023-02-18 DIAGNOSIS — M9902 Segmental and somatic dysfunction of thoracic region: Secondary | ICD-10-CM | POA: Diagnosis not present

## 2023-02-18 DIAGNOSIS — M9903 Segmental and somatic dysfunction of lumbar region: Secondary | ICD-10-CM | POA: Diagnosis not present

## 2023-02-18 DIAGNOSIS — M9901 Segmental and somatic dysfunction of cervical region: Secondary | ICD-10-CM | POA: Diagnosis not present

## 2023-02-21 DIAGNOSIS — M0589 Other rheumatoid arthritis with rheumatoid factor of multiple sites: Secondary | ICD-10-CM | POA: Diagnosis not present

## 2023-02-21 DIAGNOSIS — M9904 Segmental and somatic dysfunction of sacral region: Secondary | ICD-10-CM | POA: Diagnosis not present

## 2023-02-21 DIAGNOSIS — M9901 Segmental and somatic dysfunction of cervical region: Secondary | ICD-10-CM | POA: Diagnosis not present

## 2023-02-21 DIAGNOSIS — M9902 Segmental and somatic dysfunction of thoracic region: Secondary | ICD-10-CM | POA: Diagnosis not present

## 2023-02-21 DIAGNOSIS — M9903 Segmental and somatic dysfunction of lumbar region: Secondary | ICD-10-CM | POA: Diagnosis not present

## 2023-02-22 ENCOUNTER — Encounter: Payer: Self-pay | Admitting: Physician Assistant

## 2023-02-22 ENCOUNTER — Ambulatory Visit: Payer: Medicare PPO | Admitting: Physician Assistant

## 2023-02-22 ENCOUNTER — Ambulatory Visit: Payer: Medicare PPO | Admitting: Urology

## 2023-02-22 DIAGNOSIS — N3281 Overactive bladder: Secondary | ICD-10-CM | POA: Diagnosis not present

## 2023-02-22 NOTE — Progress Notes (Signed)
Patient ID: DENIYAH PATCHIN, female   DOB: 11-21-1950, 73 y.o.   MRN: [id number] PTNS  Session # 12  Health & Social Factors: no change Caffeine: 0 Alcohol: 0 Daytime voids #per day: 10 Night-time voids #per night: 2 Urgency: mild Incontinence Episodes #per day: 0 Ankle used: left Treatment Setting: 9 Feeling/ Response: sensory Comments: Patient tolerated well. She thinks last week was when she first started noticing improvement.  Performed By: Carman Ching, PA-C   Follow Up: Return in about 3 weeks (around 03/15/2023) for PTNS follow up.

## 2023-02-25 DIAGNOSIS — M9901 Segmental and somatic dysfunction of cervical region: Secondary | ICD-10-CM | POA: Diagnosis not present

## 2023-02-25 DIAGNOSIS — M9904 Segmental and somatic dysfunction of sacral region: Secondary | ICD-10-CM | POA: Diagnosis not present

## 2023-02-25 DIAGNOSIS — M9902 Segmental and somatic dysfunction of thoracic region: Secondary | ICD-10-CM | POA: Diagnosis not present

## 2023-02-25 DIAGNOSIS — M9903 Segmental and somatic dysfunction of lumbar region: Secondary | ICD-10-CM | POA: Diagnosis not present

## 2023-03-04 DIAGNOSIS — M9903 Segmental and somatic dysfunction of lumbar region: Secondary | ICD-10-CM | POA: Diagnosis not present

## 2023-03-04 DIAGNOSIS — M9904 Segmental and somatic dysfunction of sacral region: Secondary | ICD-10-CM | POA: Diagnosis not present

## 2023-03-04 DIAGNOSIS — M9902 Segmental and somatic dysfunction of thoracic region: Secondary | ICD-10-CM | POA: Diagnosis not present

## 2023-03-04 DIAGNOSIS — M9901 Segmental and somatic dysfunction of cervical region: Secondary | ICD-10-CM | POA: Diagnosis not present

## 2023-03-05 DIAGNOSIS — G4733 Obstructive sleep apnea (adult) (pediatric): Secondary | ICD-10-CM | POA: Diagnosis not present

## 2023-03-07 DIAGNOSIS — M9902 Segmental and somatic dysfunction of thoracic region: Secondary | ICD-10-CM | POA: Diagnosis not present

## 2023-03-07 DIAGNOSIS — M9901 Segmental and somatic dysfunction of cervical region: Secondary | ICD-10-CM | POA: Diagnosis not present

## 2023-03-07 DIAGNOSIS — M9903 Segmental and somatic dysfunction of lumbar region: Secondary | ICD-10-CM | POA: Diagnosis not present

## 2023-03-07 DIAGNOSIS — M9904 Segmental and somatic dysfunction of sacral region: Secondary | ICD-10-CM | POA: Diagnosis not present

## 2023-03-08 DIAGNOSIS — Z01419 Encounter for gynecological examination (general) (routine) without abnormal findings: Secondary | ICD-10-CM | POA: Diagnosis not present

## 2023-03-08 DIAGNOSIS — M8580 Other specified disorders of bone density and structure, unspecified site: Secondary | ICD-10-CM | POA: Diagnosis not present

## 2023-03-08 DIAGNOSIS — N952 Postmenopausal atrophic vaginitis: Secondary | ICD-10-CM | POA: Diagnosis not present

## 2023-03-08 DIAGNOSIS — Z6841 Body Mass Index (BMI) 40.0 and over, adult: Secondary | ICD-10-CM | POA: Diagnosis not present

## 2023-03-08 DIAGNOSIS — Z1231 Encounter for screening mammogram for malignant neoplasm of breast: Secondary | ICD-10-CM | POA: Diagnosis not present

## 2023-03-08 LAB — HM MAMMOGRAPHY

## 2023-03-11 DIAGNOSIS — M9904 Segmental and somatic dysfunction of sacral region: Secondary | ICD-10-CM | POA: Diagnosis not present

## 2023-03-11 DIAGNOSIS — M9903 Segmental and somatic dysfunction of lumbar region: Secondary | ICD-10-CM | POA: Diagnosis not present

## 2023-03-11 DIAGNOSIS — M9902 Segmental and somatic dysfunction of thoracic region: Secondary | ICD-10-CM | POA: Diagnosis not present

## 2023-03-11 DIAGNOSIS — M9901 Segmental and somatic dysfunction of cervical region: Secondary | ICD-10-CM | POA: Diagnosis not present

## 2023-03-12 DIAGNOSIS — H524 Presbyopia: Secondary | ICD-10-CM | POA: Diagnosis not present

## 2023-03-12 DIAGNOSIS — H35363 Drusen (degenerative) of macula, bilateral: Secondary | ICD-10-CM | POA: Diagnosis not present

## 2023-03-12 DIAGNOSIS — H2513 Age-related nuclear cataract, bilateral: Secondary | ICD-10-CM | POA: Diagnosis not present

## 2023-03-14 DIAGNOSIS — M9903 Segmental and somatic dysfunction of lumbar region: Secondary | ICD-10-CM | POA: Diagnosis not present

## 2023-03-14 DIAGNOSIS — M9902 Segmental and somatic dysfunction of thoracic region: Secondary | ICD-10-CM | POA: Diagnosis not present

## 2023-03-14 DIAGNOSIS — M9904 Segmental and somatic dysfunction of sacral region: Secondary | ICD-10-CM | POA: Diagnosis not present

## 2023-03-14 DIAGNOSIS — M9901 Segmental and somatic dysfunction of cervical region: Secondary | ICD-10-CM | POA: Diagnosis not present

## 2023-03-18 DIAGNOSIS — M9901 Segmental and somatic dysfunction of cervical region: Secondary | ICD-10-CM | POA: Diagnosis not present

## 2023-03-18 DIAGNOSIS — M9903 Segmental and somatic dysfunction of lumbar region: Secondary | ICD-10-CM | POA: Diagnosis not present

## 2023-03-18 DIAGNOSIS — M9902 Segmental and somatic dysfunction of thoracic region: Secondary | ICD-10-CM | POA: Diagnosis not present

## 2023-03-18 DIAGNOSIS — M9904 Segmental and somatic dysfunction of sacral region: Secondary | ICD-10-CM | POA: Diagnosis not present

## 2023-03-20 ENCOUNTER — Encounter: Payer: Self-pay | Admitting: Internal Medicine

## 2023-03-20 ENCOUNTER — Ambulatory Visit: Payer: Medicare PPO | Admitting: Internal Medicine

## 2023-03-20 VITALS — BP 136/92 | HR 82 | Temp 97.6°F | Ht 64.0 in | Wt 245.2 lb

## 2023-03-20 DIAGNOSIS — G4733 Obstructive sleep apnea (adult) (pediatric): Secondary | ICD-10-CM | POA: Diagnosis not present

## 2023-03-20 NOTE — Progress Notes (Signed)
Name: Wanda Lang MRN: [id number] DOB: March 08, 1950    CHIEF COMPLAINT:  Assessment of sleep apnea   HISTORY OF PRESENT ILLNESS: Patient is seen today for problems and issues with sleep related to excessive daytime sleepiness Patient  has been having sleep problems for many years Patient has been having excessive daytime sleepiness for a long time Patient has been having extreme fatigue and tiredness, lack of energy  Patient with sleep study September 2024 AHI of 31 suggestive of very severe sleep apnea  Discussed sleep data and reviewed with patient.  Encouraged proper weight management.  Discussed driving precautions and its relationship with hypersomnolence.  Discussed operating dangerous equipment and its relationship with hypersomnolence.  Discussed sleep hygiene, and benefits of a fixed sleep waked time.  The importance of getting eight or more hours of sleep discussed with patient.  Discussed limiting the use of the computer and television before bedtime.  Decrease naps during the day, so night time sleep will become enhanced.  Limit caffeine, and sleep deprivation.  HTN, stroke, and heart failure are potential risk factors.    No exacerbation at this time No evidence of heart failure at this time No evidence or signs of infection at this time No respiratory distress.   No fevers, chills, nausea, vomiting, diarrhea No evidence of lower extremity edema No evidence hemoptysis  CPAP download reviewed in detail with patient January February 2025 Excellent compliance report at 100% Usage almost 7 hours Auto CPAP 5-20 AHI reduced to 0.9 Patient use and benefits from therapy   PAST MEDICAL HISTORY :   has a past medical history of Allergy, Arthritis, Coronary artery disease, Fatty liver, GERD (gastroesophageal reflux disease), Heart murmur, Hypertension, LIVER FUNCTION TESTS, ABNORMAL, HX OF (09/08/2007), Neuromuscular disorder (HCC), and Overactive bladder.  has  a past surgical history that includes Abdominal hysterectomy; laparoscopy; Turbinate reduction; Esophagogastroduodenoscopy (egd) with propofol (N/A, 09/04/2018); Colonoscopy with propofol (N/A, 09/04/2018); Tonsillectomy; Total knee arthroplasty (Left, 07/18/2021); and Total hip arthroplasty (Right, 05/15/2022). Prior to Admission medications   Medication Sig Start Date End Date Taking? Authorizing Provider  acetaminophen (TYLENOL) 500 MG tablet Take 1,000 mg by mouth every 6 (six) hours as needed for moderate pain.    [provider]  b complex vitamins capsule Take 1 capsule by mouth daily.    [provider]  CALCIUM PO Take 1,200 mg by mouth daily.    [provider]  celecoxib (CELEBREX) 200 MG capsule Take 200 mg by mouth daily.    [provider]  cycloSPORINE (RESTASIS) 0.05 % ophthalmic emulsion Place 1 drop into both eyes 2 (two) times daily.    [provider]  estradiol (VIVELLE-DOT) 0.05 MG/24HR patch Place 1 patch onto the skin 2 (two) times a week.    [provider]  Flaxseed, Linseed, (FLAXSEED OIL) 1200 MG CAPS Take 1,200 mg by mouth daily.    [provider]  Glucosamine HCl 1000 MG TABS Take 1,000 mg by mouth daily.    [provider]  Magnesium 100 MG TABS Take 100 mg by mouth 2 (two) times a week.    [provider]  Multiple Vitamin (MULTIVITAMIN WITH MINERALS) TABS tablet Take 1 tablet by mouth daily.    [provider]  Probiotic Product (PROBIOTIC DAILY PO) Take 1 Dose by mouth daily.    [provider]  trolamine salicylate (BLUE-EMU HEMP) 10 % cream Apply 1 Application topically as needed for muscle pain.    [provider]  Vitamin D, Ergocalciferol, (DRISDOL) 1.25 MG (50000 UNIT) CAPS capsule Take 50,000 Units by mouth every 14 (fourteen) days. 07/06/22   [provider]   Allergies  Allergen Reactions   Penicillins Itching    Tolerated Cephalosporin  07/18/21.   Other reaction(s): wheezing   Amoxicillin Itching   Misc. Sulfonamide Containing Compounds    Rosanil Cleanser [Sulfacetamide Sodium-Sulfur] Other (See Comments)    Unknown reaction    Sulfonamide Derivatives     Unknown reaction    Ylang-Ylang [Cananga Oil (Ylang-Ylang)] Itching    FAMILY HISTORY:  family history includes Alcohol abuse in her mother; Anxiety disorder in her mother; Arthritis in her father and mother; Depression in her mother; Heart disease in her mother; Hyperlipidemia in her mother; Hypertension in her mother; Kidney disease in her paternal grandfather; Lung cancer in her paternal grandfather; Stroke in her mother. SOCIAL HISTORY:  reports that she has never smoked. She has never been exposed to tobacco smoke. She has never used smokeless tobacco. She reports that she does not currently use alcohol after a past usage of about 1.0 standard drink of alcohol per week. She reports that she does not use drugs.   BP (!) 136/92 (BP Location: Right Arm, Patient Position: Sitting, Cuff Size: Normal)   Pulse 82   Temp 97.6 F (36.4 C) (Temporal)   Ht 5\' 4"  (1.626 m)   Wt 245 lb 3.2 oz (111.2 kg)   BMI 42.09 kg/m    Review of Systems:  Gen:  Denies  fever, sweats, chills weight loss  HEENT: Denies blurred vision, double vision, ear pain, eye pain, hearing loss, nose bleeds, sore throat Cardiac:  No dizziness, chest pain or heaviness, chest tightness,edema, No JVD Resp:   No cough, -sputum production, -shortness of breath,-wheezing, -hemoptysis,  Gi: Denies swallowing difficulty, stomach pain, nausea or vomiting, diarrhea, constipation, bowel incontinence Gu:  Denies bladder incontinence, burning urine Ext:   Denies Joint pain, stiffness or swelling Skin: Denies  skin rash, easy bruising or bleeding or hives Endoc:  Denies polyuria, polydipsia , polyphagia or weight change Psych:   Denies depression, insomnia or hallucinations  Other:  All other systems  negative   ALL OTHER ROS ARE NEGATIVE    Physical Examination:   General Appearance: No distress  EYES PERRLA, EOM intact.   NECK Supple, No JVD ORAL CAVITY MALLAMPATI 4 Pulmonary: normal breath sounds, No wheezing.  CardiovascularNormal S1,S2.  No m/r/g.   Abdomen: Benign, Soft, non-tender. Skin:   warm, no rashes, no ecchymosis  Extremities: normal, no cyanosis, clubbing. Neuro:without focal findings,  speech normal  PSYCHIATRIC: Mood, affect within normal limits.   ALL OTHER ROS ARE NEGATIVE    ASSESSMENT AND PLAN SYNOPSIS  73 year old pleasant patient with signs and symptoms of excessive daytime sleepiness with underlying diagnosis of obstructive sleep apnea in the setting of obesity and deconditioned state    Assessment of Sleep apnea Patient has excellent compliance report Discussed in detail with patient OSA is well-controlled with CPAP Continue current prescription Auto CPAP 5-20 AHI significant reduced to 0.9 100% compliance  Patient Instructions Continue to use CPAP every night, minimum of 4-6 hours a night.  Change equipment every 30 days or as directed by DME.  Wash your tubing with warm soap and water daily, hang to dry. Wash humidifier portion weekly. Use bottled, distilled water and change daily   Be aware of reduced alertness and do not drive or operate heavy machinery if experiencing this or drowsiness.  Exercise encouraged,  as tolerated. Encouraged proper weight management.  Important to get eight or more hours of sleep  Limiting the use of the computer and television before bedtime.  Decrease naps during the day, so night time sleep will become enhanced.  Limit caffeine, and sleep deprivation.  HTN, stroke, uncontrolled diabetes and heart failure are potential risk factors.  Risk of untreated sleep apnea including cardiac arrhthymias, stroke, DM, pulm HTN.   Obesity -recommend significant weight loss -recommend changing  diet  Deconditioned state -Recommend increased daily activity and exercise   MEDICATION ADJUSTMENTS/LABS AND TESTS ORDERED: Continue CPAP as prescribed Recommend weight loss Consider water aerobics   Avoid Allergens and Irritants Avoid secondhand smoke Avoid SICK contacts Recommend  Masking  when appropriate Recommend Keep up-to-date with vaccinations   CURRENT MEDICATIONS REVIEWED AT LENGTH WITH PATIENT TODAY   Patient  satisfied with Plan of action and management. All questions answered  Follow up  6 months   I spent a total of  62 minutes reviewing chart data, face-to-face evaluation with the patient, counseling and coordination of care as detailed above.    Lucie Leather, M.D.  [facility]  Medical Director [facility] Medical Director [facility] Cardio-Pulmonary Department

## 2023-03-20 NOTE — Patient Instructions (Signed)
Excellent job A+ Gold Star!!  Continue CPAP as prescribed Recommend weight loss Consider water aerobics   Avoid Allergens and Irritants Avoid secondhand smoke Avoid SICK contacts Recommend  Masking  when appropriate Recommend Keep up-to-date with vaccinations   Be aware of reduced alertness and do not drive or operate heavy machinery if experiencing this or drowsiness.  Exercise encouraged, as tolerated. Encouraged proper weight management.  Important to get eight or more hours of sleep  Limiting the use of the computer and television before bedtime.  Decrease naps during the day, so night time sleep will become enhanced.  Limit caffeine, and sleep deprivation.

## 2023-03-20 NOTE — Progress Notes (Signed)
 03/22/2023 1:00 PM   Wanda Lang 1950/10/24 [id number]  Referring provider: Glori Luis, MD [address] [place],  [place] [address]  Urological history: 1. 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 female who presents today for follow-up after 12 weeks of PTNS.   Previous records reviewed.   When she started PTNS therapy she was having 5 daytime voids, 3 nighttime voids, strong urge to urinate and 1 incontinent episode daily.  At her 12th treatment, she was having 7 daytime voids, 2 episodes of nocturia, with a mild urgency and 0 incontinences.  She is having 8 or more voids daily, 3 or more episodes of nocturia with  a strong urge to urinate.  She is having both stress and urge incontinence.   She is leaking 1-2 times days, she wears 2-4 absorbent pads daily.  She always limits fluid intake and she does engage in toilet mapping.  Patient denies any modifying or aggravating factors.  Patient denies any recent UTI's, gross hematuria, dysuria or suprapubic/flank pain.  Patient denies any fevers, chills, nausea or vomiting.    She states her urinary symptoms have worsened since she has not had a treatment in 3 weeks.    PMH: Past Medical History:  Diagnosis Date   Allergy    Arthritis    Coronary artery disease    Fatty liver    GERD (gastroesophageal reflux disease)    Heart murmur    Hypertension    Hx   LIVER FUNCTION TESTS, ABNORMAL, HX OF 09/08/2007   Qualifier: Diagnosis of  By: Nelson-Smith CMA (AAMA), Dottie     Neuromuscular disorder [facility])    Essential Tremor   Overactive bladder     Surgical History: Past Surgical History:  Procedure Laterality Date   ABDOMINAL HYSTERECTOMY     COLONOSCOPY WITH PROPOFOL N/A 09/04/2018   Procedure: COLONOSCOPY WITH  PROPOFOL;  Surgeon: Pasty Spillers, MD;  Location: [facility] ENDOSCOPY;  Service: Endoscopy;  Laterality: N/A;   ESOPHAGOGASTRODUODENOSCOPY (EGD) WITH PROPOFOL N/A 09/04/2018   Procedure: ESOPHAGOGASTRODUODENOSCOPY (EGD) WITH PROPOFOL;  Surgeon: Pasty Spillers, MD;  Location: [facility] ENDOSCOPY;  Service: Endoscopy;  Laterality: N/A;   LAPAROSCOPY     for endometriosis x2   TONSILLECTOMY     TOTAL HIP ARTHROPLASTY Right 05/15/2022   Procedure: RIGHT TOTAL HIP ARTHROPLASTY ANTERIOR APPROACH;  Surgeon: Kathryne Hitch, MD;  Location: [facility] OR;  Service: Orthopedics;  Laterality: Right;   TOTAL KNEE ARTHROPLASTY Left 07/18/2021   Procedure: TOTAL KNEE ARTHROPLASTY;  Surgeon: Durene Romans, MD;  Location: [facility] ORS;  Service: Orthopedics;  Laterality: Left;   TURBINATE REDUCTION      Home Medications:  Allergies as of 03/22/2023       Reactions   Penicillins Itching   Tolerated Cephalosporin 07/18/21. Other reaction(s): wheezing   Amoxicillin Itching   Misc. Sulfonamide Containing Compounds    Rosanil Cleanser [sulfacetamide Sodium-sulfur] Other (See Comments)   Unknown reaction    Sulfonamide Derivatives    Unknown reaction    Ylang-ylang [cananga Oil (ylang-ylang)] Itching        Medication List        Accurate as of March 22, 2023 11:59 PM. If you have any questions, ask your nurse or doctor.          acetaminophen 500  MG tablet Commonly known as: TYLENOL Take 1,000 mg by mouth every 6 (six) hours as needed for moderate pain.   b complex vitamins capsule Take 1 capsule by mouth daily.   Blue-Emu Hemp 10 % cream Generic drug: trolamine salicylate Apply 1 Application topically as needed for muscle pain.   CALCIUM PO Take 1,200 mg by mouth daily.   celecoxib 200 MG capsule Commonly known as: CELEBREX Take 200 mg by mouth daily.   cycloSPORINE 0.05 % ophthalmic emulsion Commonly known as: RESTASIS Place 1 drop into both eyes 2 (two) times daily.    Flaxseed Oil 1200 MG Caps Take 1,200 mg by mouth daily.   Glucosamine HCl 1000 MG Tabs Take 1,000 mg by mouth daily.   Magnesium 100 MG Tabs Take 100 mg by mouth 2 (two) times a week.   multivitamin with minerals Tabs tablet Take 1 tablet by mouth daily.   predniSONE 5 MG tablet Commonly known as: DELTASONE 6 tablets once a day for 2 days, 5 tablets once a day for 2 days, 4 tablets once a day for 2 days, 3 tablets once a day for 2 days, 2 tablets once a day for 2 days, 1 tablet once a day for 2 days Orally for 12 days   PROBIOTIC DAILY PO Take 1 Dose by mouth daily.   Vagifem 10 MCG Tabs vaginal tablet Generic drug: Estradiol   Vitamin D (Ergocalciferol) 1.25 MG (50000 UNIT) Caps capsule Commonly known as: DRISDOL Take 50,000 Units by mouth every 14 (fourteen) days.        Allergies:  Allergies  Allergen Reactions   Penicillins Itching    Tolerated Cephalosporin 07/18/21.   Other reaction(s): wheezing   Amoxicillin Itching   Misc. Sulfonamide Containing Compounds    Rosanil Cleanser [Sulfacetamide Sodium-Sulfur] Other (See Comments)    Unknown reaction    Sulfonamide Derivatives     Unknown reaction    Ylang-Ylang [Cananga Oil (Ylang-Ylang)] Itching    Family History: Family History  Problem Relation Age of Onset   Alcohol abuse Mother    Arthritis Mother    Hyperlipidemia Mother    Heart disease Mother    Stroke Mother    Hypertension Mother    Depression Mother    Anxiety disorder Mother    Arthritis Father    Lung cancer Paternal Grandfather    Kidney disease Paternal Grandfather     Social History:  reports that she has never smoked. She has never been exposed to tobacco smoke. She has never used smokeless tobacco. She reports that she does not currently use alcohol after a past usage of about 1.0 standard drink of alcohol per week. She reports that she does not use drugs.  ROS: Pertinent ROS in HPI  Physical Exam: Constitutional:  Well  nourished. Alert and oriented, No acute distress. HEENT: [place] AT, moist mucus membranes.  Trachea midline, no masses. Cardiovascular: No clubbing, cyanosis, or edema. Respiratory: Normal respiratory effort, no increased work of breathing. Neurologic: Grossly intact, no focal deficits, moving all 4 extremities. Psychiatric: Normal mood and affect.    Laboratory Data: Lab Results  Component Value Date   WBC 8.9 05/17/2022   HGB 9.4 (L) 05/17/2022   HCT 27.8 (L) 05/17/2022   MCV 89.4 05/17/2022   PLT 105 (L) 05/17/2022    Lab Results  Component Value Date   CREATININE 0.80 05/16/2022     Lab Results  Component Value Date   HGBA1C 5.5 10/05/2022    Lab Results  Component Value Date   TSH 2.31 01/05/2022       Component Value Date/Time   CHOL 177 01/05/2022 1146   HDL 57 01/05/2022 1146   CHOLHDL 3.1 01/05/2022 1146   VLDL 10.8 11/30/2020 1014   LDLCALC 100 (H) 01/05/2022 1146    Lab Results  Component Value Date   AST 15 05/03/2022   Lab Results  Component Value Date   ALT 8 05/03/2022  I have reviewed the labs.  PTNS  Session # monthly maintenance  Health & Social Factors: No change Caffeine: 0 Alcohol: 0 Daytime voids #per day: 10 Night-time voids #per night: 3 Urgency: Strong  Incontinence Episodes #per day: 2 Ankle used: Left  Treatment Setting: 10  Feeling/ Response: Sensory  Comments: Patient tolerated procedure well  Performed By: Michiel Cowboy, PA-C    Pertinent Imaging: N/A   Assessment & Plan:    1. Mixed Incontinence -She found the PTNS very effective and like to continue with monthly maintenance -She is wondering if she could use it more frequently -I did discuss the "Patsi Sears" with her and she is interested -we will check with her insurance for coverage   Return in about 1 month (around 04/19/2023) for Maintenance PTNS.  These notes generated with voice recognition software. I apologize for typographical errors.  Cloretta Ned  [facility] [address]  [address] [place], [place] [address] [phone]

## 2023-03-21 DIAGNOSIS — M9902 Segmental and somatic dysfunction of thoracic region: Secondary | ICD-10-CM | POA: Diagnosis not present

## 2023-03-21 DIAGNOSIS — M9904 Segmental and somatic dysfunction of sacral region: Secondary | ICD-10-CM | POA: Diagnosis not present

## 2023-03-21 DIAGNOSIS — M9903 Segmental and somatic dysfunction of lumbar region: Secondary | ICD-10-CM | POA: Diagnosis not present

## 2023-03-21 DIAGNOSIS — M0589 Other rheumatoid arthritis with rheumatoid factor of multiple sites: Secondary | ICD-10-CM | POA: Diagnosis not present

## 2023-03-21 DIAGNOSIS — M9901 Segmental and somatic dysfunction of cervical region: Secondary | ICD-10-CM | POA: Diagnosis not present

## 2023-03-22 ENCOUNTER — Ambulatory Visit: Payer: Medicare PPO | Admitting: Urology

## 2023-03-22 DIAGNOSIS — N3946 Mixed incontinence: Secondary | ICD-10-CM | POA: Diagnosis not present

## 2023-03-25 DIAGNOSIS — M9904 Segmental and somatic dysfunction of sacral region: Secondary | ICD-10-CM | POA: Diagnosis not present

## 2023-03-25 DIAGNOSIS — M9901 Segmental and somatic dysfunction of cervical region: Secondary | ICD-10-CM | POA: Diagnosis not present

## 2023-03-25 DIAGNOSIS — M9903 Segmental and somatic dysfunction of lumbar region: Secondary | ICD-10-CM | POA: Diagnosis not present

## 2023-03-25 DIAGNOSIS — M9902 Segmental and somatic dysfunction of thoracic region: Secondary | ICD-10-CM | POA: Diagnosis not present

## 2023-03-29 DIAGNOSIS — H35363 Drusen (degenerative) of macula, bilateral: Secondary | ICD-10-CM | POA: Diagnosis not present

## 2023-04-01 DIAGNOSIS — M9901 Segmental and somatic dysfunction of cervical region: Secondary | ICD-10-CM | POA: Diagnosis not present

## 2023-04-01 DIAGNOSIS — M9904 Segmental and somatic dysfunction of sacral region: Secondary | ICD-10-CM | POA: Diagnosis not present

## 2023-04-01 DIAGNOSIS — M533 Sacrococcygeal disorders, not elsewhere classified: Secondary | ICD-10-CM | POA: Diagnosis not present

## 2023-04-01 DIAGNOSIS — M5136 Other intervertebral disc degeneration, lumbar region with discogenic back pain only: Secondary | ICD-10-CM | POA: Diagnosis not present

## 2023-04-01 DIAGNOSIS — M5416 Radiculopathy, lumbar region: Secondary | ICD-10-CM | POA: Diagnosis not present

## 2023-04-01 DIAGNOSIS — M9902 Segmental and somatic dysfunction of thoracic region: Secondary | ICD-10-CM | POA: Diagnosis not present

## 2023-04-01 DIAGNOSIS — M9903 Segmental and somatic dysfunction of lumbar region: Secondary | ICD-10-CM | POA: Diagnosis not present

## 2023-04-01 DIAGNOSIS — M7541 Impingement syndrome of right shoulder: Secondary | ICD-10-CM | POA: Diagnosis not present

## 2023-04-03 ENCOUNTER — Encounter: Payer: Self-pay | Admitting: Urology

## 2023-04-05 DIAGNOSIS — G4733 Obstructive sleep apnea (adult) (pediatric): Secondary | ICD-10-CM | POA: Diagnosis not present

## 2023-04-06 HISTORY — PX: MOUTH SURGERY: SHX715

## 2023-04-17 NOTE — Progress Notes (Deleted)
PTNS  Session # monthly maintenance  Health & Social Factors: No change Caffeine: 2 Alcohol: 1 Daytime voids #per day: 5 Night-time voids #per night: 2 Urgency: None Incontinence Episodes #per day: 0 Ankle used: Left Treatment Setting: 5 Feeling/ Response: Sensory Comments: Patient tolerated the procedure   Performed By: Michiel Cowboy, PA-C   Follow Up: One month for PTNS maintenance

## 2023-04-19 ENCOUNTER — Ambulatory Visit: Payer: Medicare PPO | Admitting: Urology

## 2023-04-19 DIAGNOSIS — N3946 Mixed incontinence: Secondary | ICD-10-CM

## 2023-05-03 ENCOUNTER — Ambulatory Visit: Admitting: Internal Medicine

## 2023-05-03 ENCOUNTER — Encounter: Payer: Self-pay | Admitting: Internal Medicine

## 2023-05-03 VITALS — BP 140/82 | HR 76 | Ht 64.0 in | Wt 245.6 lb

## 2023-05-03 DIAGNOSIS — J069 Acute upper respiratory infection, unspecified: Secondary | ICD-10-CM | POA: Insufficient documentation

## 2023-05-03 MED ORDER — PREDNISONE 20 MG PO TABS: 20.0000 mg | ORAL_TABLET | Freq: Every day | ORAL | 0 refills | Status: AC

## 2023-05-03 MED ORDER — LEVOFLOXACIN 500 MG PO TABS: 500.0000 mg | ORAL_TABLET | Freq: Every day | ORAL | 0 refills | Status: AC

## 2023-05-03 NOTE — Progress Notes (Signed)
 Subjective:  Patient ID: Wanda Lang, female    DOB: 08/03/1950  Age: 73 y.o. MRN: [id number]  CC: The encounter diagnosis was Protracted URI.   HPI Wanda Lang presents for  Chief Complaint  Patient presents with   Sore Throat   Wanda Lang is a 73 yr old female with a history of RA,  on chronic immunosuppression who presents with persistent pharyngitis,  minimally productive cough and ear pressure that started  after spending March 15th in the outdoors [place]  scratchy throat,  then no voice,  then conjunctivitis  the following 48 hours.   Has been taking Orencia for treatment of RA since 2009,  managed by Rheumatologist Syed at [facility]    Has a heart murmur .  Takes a  prophylactic dose of doxycycline  one day prior to surgery    Outpatient Medications Prior to Visit  Medication Sig Dispense Refill   acetaminophen (TYLENOL) 500 MG tablet Take 1,000 mg by mouth every 6 (six) hours as needed for moderate pain.     b complex vitamins capsule Take 1 capsule by mouth daily.     CALCIUM PO Take 1,200 mg by mouth daily.     celecoxib (CELEBREX) 200 MG capsule Take 200 mg by mouth daily.     cycloSPORINE (RESTASIS) 0.05 % ophthalmic emulsion Place 1 drop into both eyes 2 (two) times daily.     Flaxseed, Linseed, (FLAXSEED OIL) 1200 MG CAPS Take 1,200 mg by mouth daily.     Glucosamine HCl 1000 MG TABS Take 1,000 mg by mouth daily.     Magnesium 100 MG TABS Take 100 mg by mouth 2 (two) times a week.     Multiple Vitamin (MULTIVITAMIN WITH MINERALS) TABS tablet Take 1 tablet by mouth daily.     Probiotic Product (PROBIOTIC DAILY PO) Take 1 Dose by mouth daily.     trolamine salicylate (BLUE-EMU HEMP) 10 % cream Apply 1 Application topically as needed for muscle pain.     VAGIFEM 10 MCG TABS vaginal tablet      Vitamin D, Ergocalciferol, (DRISDOL) 1.25 MG (50000 UNIT) CAPS capsule Take 50,000 Units by mouth every 14 (fourteen) days.     No facility-administered medications  prior to visit.    Review of Systems;  Patient denies headache, fevers, malaise, unintentional weight loss, skin rash, eye pain, sinus congestion and sinus pain, sore throat, dysphagia,  hemoptysis , cough, dyspnea, wheezing, chest pain, palpitations, orthopnea, edema, abdominal pain, nausea, melena, diarrhea, constipation, flank pain, dysuria, hematuria, urinary  Frequency, nocturia, numbness, tingling, seizures,  Focal weakness, Loss of consciousness,  Tremor, insomnia, depression, anxiety, and suicidal ideation.      Objective:  BP (!) 140/82   Pulse 76   Ht 5\' 4"  (1.626 m)   Wt 245 lb 9.6 oz (111.4 kg)   SpO2 98%   BMI 42.16 kg/m   BP Readings from Last 3 Encounters:  05/03/23 (!) 140/82  03/20/23 (!) 136/92  02/04/23 122/82    Wt Readings from Last 3 Encounters:  05/03/23 245 lb 9.6 oz (111.4 kg)  03/20/23 245 lb 3.2 oz (111.2 kg)  02/04/23 229 lb (103.9 kg)    Physical Exam Vitals reviewed.  Constitutional:      General: She is not in acute distress.    Appearance: Normal appearance. She is normal weight. She is not ill-appearing, toxic-appearing or diaphoretic.  HENT:     Head: Normocephalic.     Right Ear: Tympanic  membrane is injected and erythematous.     Left Ear: Tympanic membrane normal.  Eyes:     General: No scleral icterus.       Right eye: No discharge.        Left eye: No discharge.     Conjunctiva/sclera: Conjunctivae normal.  Musculoskeletal:        General: Normal range of motion.  Skin:    General: Skin is warm and dry.  Neurological:     General: No focal deficit present.     Mental Status: She is alert and oriented to person, place, and time. Mental status is at baseline.  Psychiatric:        Mood and Affect: Mood normal.        Behavior: Behavior normal.        Thought Content: Thought content normal.        Judgment: Judgment normal.    Lab Results  Component Value Date   HGBA1C 5.5 10/05/2022   HGBA1C 5.6 07/07/2021   HGBA1C 5.6  11/30/2020    Lab Results  Component Value Date   CREATININE 0.80 05/16/2022   CREATININE 0.75 05/03/2022   CREATININE 0.8 02/01/2022    Lab Results  Component Value Date   WBC 8.9 05/17/2022   HGB 9.4 (L) 05/17/2022   HCT 27.8 (L) 05/17/2022   PLT 105 (L) 05/17/2022   GLUCOSE 120 (H) 05/16/2022   CHOL 177 01/05/2022   TRIG 103 01/05/2022   HDL 57 01/05/2022   LDLCALC 100 (H) 01/05/2022   ALT 8 05/03/2022   AST 15 05/03/2022   NA 139 05/16/2022   K 4.1 05/16/2022   CL 104 05/16/2022   CREATININE 0.80 05/16/2022   BUN 12 05/16/2022   CO2 28 05/16/2022   TSH 2.31 01/05/2022   INR 1.0 07/07/2021   HGBA1C 5.5 10/05/2022    No results found.  Assessment & Plan:  .Protracted URI Assessment & Plan: Symptoms have persisted since March 15, and currently induce pharryngitis, otitis and cough.  Given chronicity of symptoms, I/C state, and development of otitis ,  Will treat with empiric antibiotics, ssteroid nasal spray       Other orders -     levoFLOXacin; Take 1 tablet (500 mg total) by mouth daily for 7 days.  Dispense: 7 tablet; Refill: 0 -     predniSONE; Take 1 tablet (20 mg total) by mouth daily with breakfast for 5 days.  Dispense: 5 tablet; Refill: 0     Follow-up: No follow-ups on file.   Sherlene Shams, MD

## 2023-05-03 NOTE — Patient Instructions (Signed)
 I am treating you for sinusitis/otitis  given your persistent symptoms and red eardrum .     I am prescribing an antibiotic (levaquin) to take once daily for 7 days.   You can use flonase  nasal spray to help with any sinus/euustachian tube inflammation   Continued Daily use of a probiotic advised for 3 weeks.

## 2023-05-03 NOTE — Assessment & Plan Note (Signed)
 Symptoms have persisted since March 15, and currently induce pharryngitis, otitis and cough.  Given chronicity of symptoms, I/C state, and development of otitis ,  Will treat with empiric antibiotics, ssteroid nasal spray

## 2023-05-16 ENCOUNTER — Telehealth: Payer: Self-pay

## 2023-05-16 NOTE — Telephone Encounter (Signed)
 Copied from CRM [id number]. Topic: Clinical - Medical Advice >> May 16, 2023 11:27 AM [place] J wrote: Reason for CRM: Patient had dental surgery Monday and both ears are causing pain again. Patient finished previous antibiotic for ear pain that took place 2 weeks ago and wasn't sure if another refill request was needed or if Dr. Darrick Huntsman would like to set an appointment for re-evaluation.

## 2023-05-16 NOTE — Telephone Encounter (Signed)
Spoke with pt and scheduled her for an appt tomorrow with Dr. Derrel Nip.

## 2023-05-17 ENCOUNTER — Ambulatory Visit: Admitting: Internal Medicine

## 2023-05-17 ENCOUNTER — Encounter: Payer: Self-pay | Admitting: Internal Medicine

## 2023-05-17 VITALS — BP 122/78 | HR 105 | Ht 64.0 in | Wt 238.2 lb

## 2023-05-17 DIAGNOSIS — J320 Chronic maxillary sinusitis: Secondary | ICD-10-CM | POA: Diagnosis not present

## 2023-05-17 MED ORDER — LEVOFLOXACIN 500 MG PO TABS: 500.0000 mg | ORAL_TABLET | Freq: Every day | ORAL | 0 refills | Status: AC

## 2023-05-17 NOTE — Progress Notes (Signed)
 Subjective:  Patient ID: Wanda Lang, female    DOB: 02-06-1950  Age: 73 y.o. MRN: [id number]  CC: The encounter diagnosis was Right maxillary sinusitis.   HPI Wanda Lang presents for  Chief Complaint  Patient presents with   Ear Pain    Bilateral that returned on Monday after having oral surgery and being on antibiotics.    Return of ear pain , right sided   Hx:  Wanda Lang was treated by me on March 28 with levaquin and prednisone for  right sided  sided otitis presumed to have resulted from 3 weeks of sinus congestion .  Symptoms resolved  on the April 4th,  several days prior to oral surgery :  however during removal of a cracked molar on the right the surgeon noted that the tooth had  abscessed  Per patient , there was "a lot of pus up there " described by oral surgeon Lucky Cowboy.  She received one dose of abx prior to surgery but none afterward   .  Sympoms of right sided ear pain,  subjective fevers and tender right maxillary sinus returned the day after surgery .  She is on chronic immunosuppression for RA    Outpatient Medications Prior to Visit  Medication Sig Dispense Refill   acetaminophen (TYLENOL) 500 MG tablet Take 1,000 mg by mouth every 6 (six) hours as needed for moderate pain.     b complex vitamins capsule Take 1 capsule by mouth daily.     CALCIUM PO Take 1,200 mg by mouth daily.     celecoxib (CELEBREX) 200 MG capsule Take 200 mg by mouth daily.     chlorhexidine (PERIDEX) 0.12 % solution      cycloSPORINE (RESTASIS) 0.05 % ophthalmic emulsion Place 1 drop into both eyes 2 (two) times daily.     Flaxseed, Linseed, (FLAXSEED OIL) 1200 MG CAPS Take 1,200 mg by mouth daily.     Glucosamine HCl 1000 MG TABS Take 1,000 mg by mouth daily.     Magnesium 100 MG TABS Take 100 mg by mouth 2 (two) times a week.     Multiple Vitamin (MULTIVITAMIN WITH MINERALS) TABS tablet Take 1 tablet by mouth daily.     Probiotic Product (PROBIOTIC DAILY PO) Take 1 Dose by mouth daily.      trolamine salicylate (BLUE-EMU HEMP) 10 % cream Apply 1 Application topically as needed for muscle pain.     VAGIFEM 10 MCG TABS vaginal tablet      Vitamin D, Ergocalciferol, (DRISDOL) 1.25 MG (50000 UNIT) CAPS capsule Take 50,000 Units by mouth every 14 (fourteen) days.     No facility-administered medications prior to visit.    Review of Systems;  Patient denies headache, fevers, malaise, unintentional weight loss, skin rash, eye pain, sinus congestion and sinus pain, sore throat, dysphagia,  hemoptysis , cough, dyspnea, wheezing, chest pain, palpitations, orthopnea, edema, abdominal pain, nausea, melena, diarrhea, constipation, flank pain, dysuria, hematuria, urinary  Frequency, nocturia, numbness, tingling, seizures,  Focal weakness, Loss of consciousness,  Tremor, insomnia, depression, anxiety, and suicidal ideation.      Objective:  BP 122/78   Pulse (!) 105   Ht 5\' 4"  (1.626 m)   Wt 238 lb 3.2 oz (108 kg)   SpO2 96%   BMI 40.89 kg/m   BP Readings from Last 3 Encounters:  05/17/23 122/78  05/03/23 (!) 140/82  03/20/23 (!) 136/92    Wt Readings from Last 3 Encounters:  05/17/23 238 lb 3.2  oz (108 kg)  05/03/23 245 lb 9.6 oz (111.4 kg)  03/20/23 245 lb 3.2 oz (111.2 kg)    Physical Exam Vitals reviewed.  Constitutional:      General: She is not in acute distress.    Appearance: Normal appearance. She is normal weight. She is not ill-appearing, toxic-appearing or diaphoretic.  HENT:     Head: Normocephalic.      Comments: Right maxillary sinus tenderness     Right Ear: Tympanic membrane, ear canal and external ear normal.  Eyes:     General: No scleral icterus.       Right eye: No discharge.        Left eye: No discharge.     Conjunctiva/sclera: Conjunctivae normal.  Cardiovascular:     Rate and Rhythm: Normal rate and regular rhythm.     Heart sounds: Normal heart sounds.  Pulmonary:     Effort: Pulmonary effort is normal. No respiratory distress.      Breath sounds: Normal breath sounds.  Musculoskeletal:        General: Normal range of motion.  Skin:    General: Skin is warm and dry.  Neurological:     General: No focal deficit present.     Mental Status: She is alert and oriented to person, place, and time. Mental status is at baseline.  Psychiatric:        Mood and Affect: Mood normal.        Behavior: Behavior normal.        Thought Content: Thought content normal.        Judgment: Judgment normal.    Lab Results  Component Value Date   HGBA1C 5.5 10/05/2022   HGBA1C 5.6 07/07/2021   HGBA1C 5.6 11/30/2020    Lab Results  Component Value Date   CREATININE 0.80 05/16/2022   CREATININE 0.75 05/03/2022   CREATININE 0.8 02/01/2022    Lab Results  Component Value Date   WBC 8.9 05/17/2022   HGB 9.4 (L) 05/17/2022   HCT 27.8 (L) 05/17/2022   PLT 105 (L) 05/17/2022   GLUCOSE 120 (H) 05/16/2022   CHOL 177 01/05/2022   TRIG 103 01/05/2022   HDL 57 01/05/2022   LDLCALC 100 (H) 01/05/2022   ALT 8 05/03/2022   AST 15 05/03/2022   NA 139 05/16/2022   K 4.1 05/16/2022   CL 104 05/16/2022   CREATININE 0.80 05/16/2022   BUN 12 05/16/2022   CO2 28 05/16/2022   TSH 2.31 01/05/2022   INR 1.0 07/07/2021   HGBA1C 5.5 10/05/2022    No results found.  Assessment & Plan:  .Right maxillary sinusitis Assessment & Plan: Suggested by recurrence of symptoms after abscessed tooth was removed.  Repeat therapy with levaquin given immunosuppressed state .  Daily use of Probiotics for  3 weeks advised to reduce risk of C dificile colitis.     Other orders -     levoFLOXacin; Take 1 tablet (500 mg total) by mouth daily for 7 days.  Dispense: 7 tablet; Refill: 0     I spent 34 minutes on the day of this face to face encounter reviewing patient's  most recent visit with cardiology,  nephrology,  and neurology,  prior relevant surgical and non surgical procedures, recent  labs and imaging studies, counseling on weight management,   reviewing the assessment and plan with patient, and post visit ordering and reviewing of  diagnostics and therapeutics with patient  .   Follow-up: No follow-ups on  file.   Sherlene Shams, MD

## 2023-05-17 NOTE — Assessment & Plan Note (Signed)
 Suggested by recurrence of symptoms after abscessed tooth was removed.  Repeat therapy with levaquin given immunosuppressed state .  Daily use of Probiotics for  3 weeks advised to reduce risk of C dificile colitis.

## 2023-05-30 ENCOUNTER — Encounter: Attending: Physical Medicine & Rehabilitation | Admitting: Physical Medicine & Rehabilitation

## 2023-05-30 ENCOUNTER — Ambulatory Visit: Admitting: Urology

## 2023-05-30 ENCOUNTER — Encounter: Payer: Self-pay | Admitting: Physical Medicine & Rehabilitation

## 2023-05-30 VITALS — BP 112/73 | HR 92 | Temp 92.0°F | Ht 64.0 in | Wt 239.0 lb

## 2023-05-30 DIAGNOSIS — G8929 Other chronic pain: Secondary | ICD-10-CM | POA: Diagnosis present

## 2023-05-30 DIAGNOSIS — M545 Low back pain, unspecified: Secondary | ICD-10-CM | POA: Insufficient documentation

## 2023-05-30 DIAGNOSIS — N3946 Mixed incontinence: Secondary | ICD-10-CM | POA: Diagnosis not present

## 2023-05-30 DIAGNOSIS — M25511 Pain in right shoulder: Secondary | ICD-10-CM | POA: Insufficient documentation

## 2023-05-30 MED ORDER — LIDOCAINE HCL 1 % IJ SOLN
4.0000 mL | Freq: Once | INTRAMUSCULAR | Status: AC
  Administered 2023-05-30: 4 mL

## 2023-05-30 MED ORDER — BETAMETHASONE SOD PHOS & ACET 6 (3-3) MG/ML IJ SUSP
6.0000 mg | Freq: Once | INTRAMUSCULAR | Status: AC
  Administered 2023-05-30: 6 mg via INTRAMUSCULAR

## 2023-05-30 NOTE — Progress Notes (Signed)
 PTNS  Session # Monthly Maintenance  Health & Social Factors: No change  Caffeine: 0 Alcohol: 0 Daytime voids #per day: 20 Night-time voids #per night: 6 Urgency: mild Incontinence Episodes #per day: 5 Ankle used: Left Treatment Setting: 11 Feeling/ Response: Sensory Comments: Patient tolerated procedure.   Performed By: Matilde Son, PA-C   Follow Up: 1 month for PTNS maintenance

## 2023-05-30 NOTE — Progress Notes (Signed)
 Subjective:    Patient ID: Wanda Lang, female    DOB: [birth year], 73 y.o.   MRN: [id number]  HPI  CC:  Right shoulder pain 73 year old female with history of chronic low back pain.  She had seen me initially for sacroiliac dysfunction, further workup revealed right hip OA, severe. Last seen by me Feb 2024.  Interval hx of RIght hip arthroplasty per Dr. Lucienne Ryder.  She has had shortening of the right lower extremity requiring built-up  right shoe.  But now feels level  Regards to the shoulder there have been no recent injuries.  She does notice increased pain with overhead reaching.  She has no x-rays within the last year.  She thinks she may have had an x-ray at [facility] a couple years ago Previously treated for bursitis, seen by ortho at  Also sees Rheumatologist who gave her an injection in the shoulder All of her injections have been greater than 6 months ago Pain Inventory Average Pain 6 Pain Right Now 5 My pain is intermittent, sharp, and stabbing  In the last 24 hours, has pain interfered with the following? General activity 7 Relation with others 0 Enjoyment of life 5 What TIME of day is your pain at its worst? varies Sleep (in general) Poor  Pain is worse with: bending and standing Pain improves with: rest, medication, and injections Relief from Meds: 8  Family History  Problem Relation Age of Onset   Alcohol abuse Mother    Arthritis Mother    Hyperlipidemia Mother    Heart disease Mother    Stroke Mother    Hypertension Mother    Depression Mother    Anxiety disorder Mother    Arthritis Father    Lung cancer Paternal Grandfather    Kidney disease Paternal Grandfather    Social History   Socioeconomic History   Marital status: Widowed    Spouse name: Not on file   Number of children: Not on file   Years of education: Not on file   Highest education level: Bachelor's degree (e.g., BA, AB, BS)  Occupational History   Not on file  Tobacco Use    Smoking status: Never    Passive exposure: Never   Smokeless tobacco: Never  Vaping Use   Vaping status: Never Used  Substance and Sexual Activity   Alcohol use: Not Currently    Alcohol/week: 1.0 standard drink of alcohol    Types: 1 Glasses of wine per week   Drug use: No   Sexual activity: Not Currently  Other Topics Concern   Not on file  Social History Narrative   Not on file   Social Drivers of Health   Financial Resource Strain: Low Risk  (05/02/2023)   Overall Financial Resource Strain (CARDIA)    Difficulty of Paying Living Expenses: Not hard at all  Food Insecurity: No Food Insecurity (05/02/2023)   Hunger Vital Sign    Worried About Running Out of Food in the Last Year: Never true    Ran Out of Food in the Last Year: Never true  Transportation Needs: No Transportation Needs (05/02/2023)   PRAPARE - Administrator, Civil Service (Medical): No    Lack of Transportation (Non-Medical): No  Physical Activity: Inactive (05/02/2023)   Exercise Vital Sign    Days of Exercise per Week: 0 days    Minutes of Exercise per Session: 20 min  Stress: No Stress Concern Present (05/02/2023)   [employer] of Occupational Health -  Occupational Stress Questionnaire    Feeling of Stress : Only a little  Social Connections: Moderately Integrated (05/02/2023)   Social Connection and Isolation Panel [NHANES]    Frequency of Communication with Friends and Family: More than three times a week    Frequency of Social Gatherings with Friends and Family: More than three times a week    Attends Religious Services: More than 4 times per year    Active Member of Clubs or Organizations: Yes    Attends Banker Meetings: More than 4 times per year    Marital Status: Divorced   Past Surgical History:  Procedure Laterality Date   ABDOMINAL HYSTERECTOMY     COLONOSCOPY WITH PROPOFOL  N/A 09/04/2018   Procedure: COLONOSCOPY WITH PROPOFOL ;  Surgeon: Irby Mannan, MD;   Location: [facility];  Service: Endoscopy;  Laterality: N/A;   ESOPHAGOGASTRODUODENOSCOPY (EGD) WITH PROPOFOL  N/A 09/04/2018   Procedure: ESOPHAGOGASTRODUODENOSCOPY (EGD) WITH PROPOFOL ;  Surgeon: Irby Mannan, MD;  Location: [facility]COPY;  Service: Endoscopy;  Laterality: N/A;   LAPAROSCOPY     for endometriosis x2   MOUTH SURGERY  04/2023   tooth extraction, gum graft   TONSILLECTOMY     TOTAL HIP ARTHROPLASTY Right 05/15/2022   Procedure: RIGHT TOTAL HIP ARTHROPLASTY ANTERIOR APPROACH;  Surgeon: Arnie Lao, MD;  Location: [facility];  Service: Orthopedics;  Laterality: Right;   TOTAL KNEE ARTHROPLASTY Left 07/18/2021   Procedure: TOTAL KNEE ARTHROPLASTY;  Surgeon: Claiborne Crew, MD;  Location: [facility];  Service: Orthopedics;  Laterality: Left;   TURBINATE REDUCTION     Past Surgical History:  Procedure Laterality Date   ABDOMINAL HYSTERECTOMY     COLONOSCOPY WITH PROPOFOL  N/A 09/04/2018   Procedure: COLONOSCOPY WITH PROPOFOL ;  Surgeon: Irby Mannan, MD;  Location: [facility];  Service: Endoscopy;  Laterality: N/A;   ESOPHAGOGASTRODUODENOSCOPY (EGD) WITH PROPOFOL  N/A 09/04/2018   Procedure: ESOPHAGOGASTRODUODENOSCOPY (EGD) WITH PROPOFOL ;  Surgeon: Irby Mannan, MD;  Location: [facility];  Service: Endoscopy;  Laterality: N/A;   LAPAROSCOPY     for endometriosis x2   MOUTH SURGERY  04/2023   tooth extraction, gum graft   TONSILLECTOMY     TOTAL HIP ARTHROPLASTY Right 05/15/2022   Procedure: RIGHT TOTAL HIP ARTHROPLASTY ANTERIOR APPROACH;  Surgeon: Arnie Lao, MD;  Location: [facility] OR;  Service: Orthopedics;  Laterality: Right;   TOTAL KNEE ARTHROPLASTY Left 07/18/2021   Procedure: TOTAL KNEE ARTHROPLASTY;  Surgeon: Claiborne Crew, MD;  Location: [facility];  Service: Orthopedics;  Laterality: Left;   TURBINATE REDUCTION     Past Medical History:  Diagnosis Date   Allergy    Arthritis    Coronary artery disease    Fatty liver     GERD (gastroesophageal reflux disease)    Heart murmur    Hypertension    Hx   LIVER FUNCTION TESTS, ABNORMAL, HX OF 09/08/2007   Qualifier: Diagnosis of  By: Nelson-Smith CMA (AAMA), Dottie     Neuromuscular disorder (HCC)    Essential Tremor   Overactive bladder    Ht 5\' 4"  (1.626 m)   Wt 239 lb (108.4 kg)   BMI 41.02 kg/m   Opioid Risk Score:   Fall Risk Score:  `1  Depression screen [facility] 2/9     05/30/2023    3:40 PM 05/17/2023    2:42 PM 05/03/2023   10:29 AM 02/04/2023    9:35 AM 11/14/2022    1:13 PM 10/05/2022    8:36 AM 07/19/2022  10:15 AM  Depression screen PHQ 2/9  Decreased Interest 0 0 1 0 0 0 0  Down, Depressed, Hopeless 0 0 0 0 0 0 0  PHQ - 2 Score 0 0 1 0 0 0 0  Altered sleeping    0 3 0 3  Tired, decreased energy    0 1 0 1  Change in appetite    0 0 0 0  Feeling bad or failure about yourself     0 0 0 0  Trouble concentrating    0 3 0 0  Moving slowly or fidgety/restless    0 0 0 0  Suicidal thoughts    0 0 0 0  PHQ-9 Score    0 7 0 4  Difficult doing work/chores    Not difficult at all Somewhat difficult Not difficult at all Not difficult at all    Review of Systems  Musculoskeletal:  Positive for back pain.       Pain in the right shoulder  All other systems reviewed and are negative.      Objective:   Physical Exam  Positive speeds test right side Positive impingement sign right side Tenderness in the subacromial area No tenderness over the acromioclavicular area Tenderness over the bicipital groove right side Pain with internal and external rotation as well as limitation right side No pain with cervical spine range of motion Lumbar there is no tenderness palpation in the lumbar paraspinals no tenderness over the PSIS   Sacral thrust (prone) :) Negative Lateral compression: Negative FABER's: Negative Distraction (supine): Negative Thigh thrust test: Negative  Ambulates without assistive device uses a built-up sole on the right  shoe. Tenderness right lumbar around L3-4 paraspinal region.  No clear-cut directional component to this Negative straight leg raise     Assessment & Plan:   1.  Right shoulder pain degenerative, likely multifactorial.  Exam consistent with bicipital tendinitis or tendinopathy as well as adhesive capsulitis as well as impingement syndrome.  Will check x-rays, referral to physical therapy 2.  Chronic low back pain her sacroiliac disorder seems to have subsided following her hip replacement.  She does have some right-sided mid lumbar pain and will check some x-rays I suspect she has some lumbar spondylosis.  She has a minimal scoliosis but I do not think that this is having left impact.  Shoulder injection right subacromial   Indication: Right shoulder pain not relieved by medication management and other conservative care.  Informed consent was obtained after describing risks and benefits of the procedure with the patient, this includes bleeding, bruising, infection and medication side effects. The patient wishes to proceed and has given written consent. Patient was placed in a seated position. The right shoulder was marked and prepped with betadine  in the subacromial area. A 25-gauge 1-1/2 inch needle was inserted into the subacromial area. After negative draw back for blood, a solution containing 1 mL of 6 mg per ML betamethasone  and 4 mL of 1% lidocaine  was injected. A band aid was applied. The patient tolerated the procedure well. Post procedure instructions were given.

## 2023-06-05 ENCOUNTER — Encounter: Payer: Self-pay | Admitting: Nurse Practitioner

## 2023-06-05 ENCOUNTER — Ambulatory Visit: Payer: Medicare PPO | Admitting: Nurse Practitioner

## 2023-06-05 VITALS — BP 124/80 | HR 94 | Temp 97.8°F | Ht 64.0 in | Wt 245.2 lb

## 2023-06-05 DIAGNOSIS — Z13220 Encounter for screening for lipoid disorders: Secondary | ICD-10-CM | POA: Diagnosis not present

## 2023-06-05 DIAGNOSIS — I350 Nonrheumatic aortic (valve) stenosis: Secondary | ICD-10-CM | POA: Diagnosis not present

## 2023-06-05 DIAGNOSIS — Z8679 Personal history of other diseases of the circulatory system: Secondary | ICD-10-CM | POA: Diagnosis not present

## 2023-06-05 DIAGNOSIS — M069 Rheumatoid arthritis, unspecified: Secondary | ICD-10-CM | POA: Diagnosis not present

## 2023-06-05 DIAGNOSIS — Z1329 Encounter for screening for other suspected endocrine disorder: Secondary | ICD-10-CM

## 2023-06-05 DIAGNOSIS — G4733 Obstructive sleep apnea (adult) (pediatric): Secondary | ICD-10-CM | POA: Diagnosis not present

## 2023-06-05 NOTE — Progress Notes (Signed)
 Bluford Burkitt, NP-C Phone: [phone]  Wanda Lang is a 73 y.o. female who presents today for transfer of care.   Discussed the use of AI scribe software for clinical note transcription with the patient, who gave verbal consent to proceed.  History of Present Illness   Wanda Lang "Wanda Lang" is a 73 year old female who presents for a transfer of care visit.  She has a history of rheumatoid arthritis since 2001 and is under the care of a rheumatologist. There are no new problems or concerns related to her rheumatoid arthritis at this time.  She has a history of aortic valve stenosis and recently underwent an echocardiogram. She is unsure of the results. She experiences shortness of breath easily, which she attributes in part to her sedentary lifestyle following a period of being unable to bear weight on her leg in late 2023 and early 2024. She is now working on increasing her walking activity.  She reports shoulder pain, for which she is seeing Physical Medicine. She is also taking several supplements including B complex, calcium , flaxseed, glucosamine, magnesium , multivitamin, probiotic, and vitamin D.  She has a history of sleep apnea and uses a CPAP machine nightly, which she feels has improved her sleep quality. However, she still wakes up several times a night to urinate, which affects her ability to complete a full sleep cycle. Recently, she has been able to sleep for four-hour stretches, which she believes allows her to complete at least one full sleep cycle.  She has a history of a total hip replacement and knee issues, as well as spondylitis. She also had a complete hysterectomy in 2013 and continues to receive annual exams, mammograms, and bone density tests through her gynecologist.      Social History   Tobacco Use  Smoking Status Never   Passive exposure: Never  Smokeless Tobacco Never    Current Outpatient Medications on File Prior to Visit  Medication Sig Dispense  Refill   Abatacept  (ORENCIA  IV) Inject into the vein.     acetaminophen  (TYLENOL ) 500 MG tablet Take 1,000 mg by mouth every 6 (six) hours as needed for moderate pain.     b complex vitamins capsule Take 1 capsule by mouth daily.     CALCIUM  PO Take 1,200 mg by mouth daily.     celecoxib  (CELEBREX ) 200 MG capsule Take 200 mg by mouth daily.     chlorhexidine  (PERIDEX ) 0.12 % solution      cycloSPORINE  (RESTASIS ) 0.05 % ophthalmic emulsion Place 1 drop into both eyes 2 (two) times daily.     Flaxseed, Linseed, (FLAXSEED OIL) 1200 MG CAPS Take 1,200 mg by mouth daily.     Glucosamine HCl 1000 MG TABS Take 1,000 mg by mouth daily.     lidocaine  (LIDODERM ) 5 % Place 1 patch onto the skin daily.     Magnesium  100 MG TABS Take 100 mg by mouth 2 (two) times a week.     Multiple Vitamin (MULTIVITAMIN WITH MINERALS) TABS tablet Take 1 tablet by mouth daily.     oxyCODONE  (OXY IR/ROXICODONE ) 5 MG immediate release tablet TAKE 1 TABLET BY MOUTH FOUR TIMES DAILY AS NEEDED FOR SEVERE PAIN.     Probiotic Product (PROBIOTIC DAILY PO) Take 1 Dose by mouth daily.     trolamine salicylate (BLUE-EMU HEMP) 10 % cream Apply 1 Application topically as needed for muscle pain.     VAGIFEM 10 MCG TABS vaginal tablet      Vitamin  D, Ergocalciferol, (DRISDOL) 1.25 MG (50000 UNIT) CAPS capsule Take 50,000 Units by mouth every 14 (fourteen) days.     No current facility-administered medications on file prior to visit.     ROS see history of present illness  Objective  Physical Exam Vitals:   06/05/23 1519  BP: 124/80  Pulse: 94  Temp: 97.8 F (36.6 C)  SpO2: 96%    BP Readings from Last 3 Encounters:  06/05/23 124/80  05/30/23 112/73  05/17/23 122/78   Wt Readings from Last 3 Encounters:  06/05/23 245 lb 3.2 oz (111.2 kg)  05/30/23 239 lb (108.4 kg)  05/17/23 238 lb 3.2 oz (108 kg)    Physical Exam Constitutional:      General: She is not in acute distress.    Appearance: Normal appearance. She  is obese.  HENT:     Head: Normocephalic.  Cardiovascular:     Rate and Rhythm: Normal rate and regular rhythm.     Heart sounds: Murmur heard.  Pulmonary:     Effort: Pulmonary effort is normal.     Breath sounds: Normal breath sounds.  Skin:    General: Skin is warm and dry.  Neurological:     General: No focal deficit present.     Mental Status: She is alert.  Psychiatric:        Mood and Affect: Mood normal.        Behavior: Behavior normal.      Assessment/Plan: Please see individual problem list.  Aortic stenosis, moderate Assessment & Plan: Chronic issue. Recent echocardiogram. Harsh murmur present on exam. Increased breathlessness is likely due to stenosis and inactivity. Follow up with cardiology as scheduled.   Orders: -     CBC with Differential/Platelet  OSA (obstructive sleep apnea) Assessment & Plan: Chronic. CPAP therapy effectively controls apnea. Sleep quality has improved, but nocturia persists. Continue nightly CPAP use.    Rheumatoid arthritis, involving unspecified site, unspecified whether rheumatoid factor present [facility]) Assessment & Plan: Long-standing condition managed by rheumatology. No new issues. Continue current management. Follow up with Rheumatology as scheduled.   Orders: -     Comprehensive metabolic panel with GFR  History of hypertension Assessment & Plan: Blood pressure is well controlled without medication. At goal today. We will continue to monitor.    Morbid obesity (HCC) Assessment & Plan: Encourage healthy diet and increasing exercise as tolerated.   Orders: -     Hemoglobin A1c  Lipid screening -     Lipid panel  Thyroid  disorder screen -     TSH     Return in about 6 months (around 12/05/2023) for Follow up.   Bluford Burkitt, NP-C [facility]

## 2023-06-05 NOTE — Assessment & Plan Note (Signed)
 Blood pressure is well controlled without medication. At goal today. We will continue to monitor.

## 2023-06-05 NOTE — Assessment & Plan Note (Signed)
 Encourage healthy diet and increasing exercise as tolerated.

## 2023-06-05 NOTE — Assessment & Plan Note (Signed)
 Chronic issue. Recent echocardiogram. Harsh murmur present on exam. Increased breathlessness is likely due to stenosis and inactivity. Follow up with cardiology as scheduled.

## 2023-06-05 NOTE — Assessment & Plan Note (Signed)
 Long-standing condition managed by rheumatology. No new issues. Continue current management. Follow up with Rheumatology as scheduled.

## 2023-06-05 NOTE — Assessment & Plan Note (Signed)
 Chronic. CPAP therapy effectively controls apnea. Sleep quality has improved, but nocturia persists. Continue nightly CPAP use.

## 2023-06-06 ENCOUNTER — Encounter: Payer: Self-pay | Admitting: Nurse Practitioner

## 2023-06-06 LAB — COMPREHENSIVE METABOLIC PANEL WITH GFR
ALT: 16 U/L (ref 0–35)
AST: 21 U/L (ref 0–37)
Albumin: 3.9 g/dL (ref 3.5–5.2)
Alkaline Phosphatase: 102 U/L (ref 39–117)
BUN: 21 mg/dL (ref 6–23)
CO2: 28 meq/L (ref 19–32)
Calcium: 9.1 mg/dL (ref 8.4–10.5)
Chloride: 107 meq/L (ref 96–112)
Creatinine, Ser: 0.79 mg/dL (ref 0.40–1.20)
GFR: 74.67 mL/min (ref >60.00)
Glucose, Bld: 126 mg/dL — ABNORMAL HIGH (ref 70–99)
Potassium: 4.2 meq/L (ref 3.5–5.1)
Sodium: 142 meq/L (ref 135–145)
Total Bilirubin: 0.4 mg/dL (ref 0.2–1.2)
Total Protein: 6.5 g/dL (ref 6.0–8.3)

## 2023-06-06 LAB — LIPID PANEL
Cholesterol: 172 mg/dL (ref 0–200)
HDL: 53.6 mg/dL (ref >39.00)
LDL Cholesterol: 99 mg/dL (ref 0–99)
NonHDL: 118.32
Total CHOL/HDL Ratio: 3
Triglycerides: 96 mg/dL (ref 0.0–149.0)
VLDL: 19.2 mg/dL (ref 0.0–40.0)

## 2023-06-06 LAB — CBC WITH DIFFERENTIAL/PLATELET
Basophils Absolute: 0.1 10*3/uL (ref 0.0–0.1)
Basophils Relative: 0.9 % (ref 0.0–3.0)
Eosinophils Absolute: 0.4 10*3/uL (ref 0.0–0.7)
Eosinophils Relative: 6.3 % — ABNORMAL HIGH (ref 0.0–5.0)
HCT: 40.5 % (ref 36.0–46.0)
Hemoglobin: 13.2 g/dL (ref 12.0–15.0)
Lymphocytes Relative: 27.8 % (ref 12.0–46.0)
Lymphs Abs: 1.9 10*3/uL (ref 0.7–4.0)
MCHC: 32.7 g/dL (ref 30.0–36.0)
MCV: 90.7 fl (ref 78.0–100.0)
Monocytes Absolute: 0.6 10*3/uL (ref 0.1–1.0)
Monocytes Relative: 8.1 % (ref 3.0–12.0)
Neutro Abs: 3.9 10*3/uL (ref 1.4–7.7)
Neutrophils Relative %: 56.9 % (ref 43.0–77.0)
Platelets: 232 10*3/uL (ref 150.0–400.0)
RBC: 4.46 Mil/uL (ref 3.87–5.11)
RDW: 14.2 % (ref 11.5–15.5)
WBC: 6.9 10*3/uL (ref 4.0–10.5)

## 2023-06-06 LAB — TSH: TSH: 0.91 u[IU]/mL (ref 0.35–5.50)

## 2023-06-06 LAB — HEMOGLOBIN A1C: Hgb A1c MFr Bld: 6.1 % (ref 4.6–6.5)

## 2023-06-19 ENCOUNTER — Other Ambulatory Visit

## 2023-06-19 ENCOUNTER — Telehealth: Payer: Self-pay | Admitting: Nurse Practitioner

## 2023-06-19 ENCOUNTER — Ambulatory Visit (INDEPENDENT_AMBULATORY_CARE_PROVIDER_SITE_OTHER)

## 2023-06-19 DIAGNOSIS — G8929 Other chronic pain: Secondary | ICD-10-CM

## 2023-06-19 DIAGNOSIS — M25511 Pain in right shoulder: Secondary | ICD-10-CM | POA: Diagnosis not present

## 2023-06-19 DIAGNOSIS — M545 Low back pain, unspecified: Secondary | ICD-10-CM

## 2023-06-19 NOTE — Telephone Encounter (Signed)
 Patient returned our call and I scheduled her for a lab visit with us  today at 2pm.

## 2023-06-19 NOTE — Telephone Encounter (Signed)
 Left message for patient to call back to schedule her x rays. If the times that office has for xrays are not good for the patient, please let her know she can also go to [facility] on [address], [place].

## 2023-06-27 ENCOUNTER — Ambulatory Visit: Admitting: Urology

## 2023-06-28 LAB — LAB REPORT - SCANNED: EGFR: 92

## 2023-07-08 DIAGNOSIS — M069 Rheumatoid arthritis, unspecified: Secondary | ICD-10-CM | POA: Diagnosis not present

## 2023-07-08 DIAGNOSIS — I2510 Atherosclerotic heart disease of native coronary artery without angina pectoris: Secondary | ICD-10-CM | POA: Diagnosis not present

## 2023-07-08 DIAGNOSIS — R269 Unspecified abnormalities of gait and mobility: Secondary | ICD-10-CM | POA: Diagnosis not present

## 2023-07-08 DIAGNOSIS — M9904 Segmental and somatic dysfunction of sacral region: Secondary | ICD-10-CM | POA: Diagnosis not present

## 2023-07-08 DIAGNOSIS — R011 Cardiac murmur, unspecified: Secondary | ICD-10-CM | POA: Diagnosis not present

## 2023-07-08 DIAGNOSIS — M9901 Segmental and somatic dysfunction of cervical region: Secondary | ICD-10-CM | POA: Diagnosis not present

## 2023-07-08 DIAGNOSIS — K219 Gastro-esophageal reflux disease without esophagitis: Secondary | ICD-10-CM | POA: Diagnosis not present

## 2023-07-08 DIAGNOSIS — Z9181 History of falling: Secondary | ICD-10-CM | POA: Diagnosis not present

## 2023-07-08 DIAGNOSIS — M9903 Segmental and somatic dysfunction of lumbar region: Secondary | ICD-10-CM | POA: Diagnosis not present

## 2023-07-08 DIAGNOSIS — H269 Unspecified cataract: Secondary | ICD-10-CM | POA: Diagnosis not present

## 2023-07-08 DIAGNOSIS — Z791 Long term (current) use of non-steroidal anti-inflammatories (NSAID): Secondary | ICD-10-CM | POA: Diagnosis not present

## 2023-07-08 DIAGNOSIS — M9902 Segmental and somatic dysfunction of thoracic region: Secondary | ICD-10-CM | POA: Diagnosis not present

## 2023-07-08 DIAGNOSIS — M1990 Unspecified osteoarthritis, unspecified site: Secondary | ICD-10-CM | POA: Diagnosis not present

## 2023-07-08 DIAGNOSIS — M461 Sacroiliitis, not elsewhere classified: Secondary | ICD-10-CM | POA: Diagnosis not present

## 2023-07-08 DIAGNOSIS — D84821 Immunodeficiency due to drugs: Secondary | ICD-10-CM | POA: Diagnosis not present

## 2023-07-08 DIAGNOSIS — E785 Hyperlipidemia, unspecified: Secondary | ICD-10-CM | POA: Diagnosis not present

## 2023-07-08 DIAGNOSIS — K760 Fatty (change of) liver, not elsewhere classified: Secondary | ICD-10-CM | POA: Diagnosis not present

## 2023-07-08 DIAGNOSIS — M5440 Lumbago with sciatica, unspecified side: Secondary | ICD-10-CM | POA: Diagnosis not present

## 2023-07-08 DIAGNOSIS — I10 Essential (primary) hypertension: Secondary | ICD-10-CM | POA: Diagnosis not present

## 2023-07-08 DIAGNOSIS — M810 Age-related osteoporosis without current pathological fracture: Secondary | ICD-10-CM | POA: Diagnosis not present

## 2023-07-08 DIAGNOSIS — M4800 Spinal stenosis, site unspecified: Secondary | ICD-10-CM | POA: Diagnosis not present

## 2023-07-10 ENCOUNTER — Other Ambulatory Visit: Payer: Self-pay

## 2023-07-10 ENCOUNTER — Ambulatory Visit (HOSPITAL_BASED_OUTPATIENT_CLINIC_OR_DEPARTMENT_OTHER): Attending: Physical Medicine & Rehabilitation | Admitting: Physical Therapy

## 2023-07-10 DIAGNOSIS — M6281 Muscle weakness (generalized): Secondary | ICD-10-CM | POA: Insufficient documentation

## 2023-07-10 DIAGNOSIS — M25611 Stiffness of right shoulder, not elsewhere classified: Secondary | ICD-10-CM | POA: Diagnosis not present

## 2023-07-10 DIAGNOSIS — G8929 Other chronic pain: Secondary | ICD-10-CM | POA: Diagnosis not present

## 2023-07-10 DIAGNOSIS — M25511 Pain in right shoulder: Secondary | ICD-10-CM | POA: Insufficient documentation

## 2023-07-10 NOTE — Therapy (Signed)
 OUTPATIENT PHYSICAL THERAPY SHOULDER EVALUATION   Patient Name: Wanda Lang MRN: [id number] DOB:[birth year], 73 y.o., female Today's Date: 07/11/2023  END OF SESSION:  PT End of Session - 07/10/2023    Visit Number 1   Number of Visits 12   Date for PT Re-Evaluation 08/21/2023   Authorization Type [employer] M[employer]   PT Start Time  1104   PT Stop Time 1157   PT Time Calculation (min) 53 min   Activity Tolerance Patient tolerated treatment well   Behavior During Therapy  WFL for tasks assessed/performed    Past Medical History:  Diagnosis Date   Allergy    Arthritis    Coronary artery disease    Fatty liver    GERD (gastroesophageal reflux disease)    Heart murmur    Hypertension    Hx   LIVER FUNCTION TESTS, ABNORMAL, HX OF 09/08/2007   Qualifier: Diagnosis of  By: Nelson-Smith CMA (AAMA), Dottie     Neuromuscular disorder (HCC)    Essential Tremor   Overactive bladder    Past Surgical History:  Procedure Laterality Date   ABDOMINAL HYSTERECTOMY     COLONOSCOPY WITH PROPOFOL  N/A 09/04/2018   Procedure: COLONOSCOPY WITH PROPOFOL ;  Surgeon: Irby Mannan, MD;  Location: [facility] ENDOSCOPY;  Service: Endoscopy;  Laterality: N/A;   ESOPHAGOGASTRODUODENOSCOPY (EGD) WITH PROPOFOL  N/A 09/04/2018   Procedure: ESOPHAGOGASTRODUODENOSCOPY (EGD) WITH PROPOFOL ;  Surgeon: Irby Mannan, MD;  Location: [facility] ENDOSCOPY;  Service: Endoscopy;  Laterality: N/A;   LAPAROSCOPY     for endometriosis x2   MOUTH SURGERY  04/2023   tooth extraction, gum graft   TONSILLECTOMY     TOTAL HIP ARTHROPLASTY Right 05/15/2022   Procedure: RIGHT TOTAL HIP ARTHROPLASTY ANTERIOR APPROACH;  Surgeon: Arnie Lao, MD;  Location: [facility] OR;  Service: Orthopedics;  Laterality: Right;   TOTAL KNEE ARTHROPLASTY Left 07/18/2021   Procedure: TOTAL KNEE ARTHROPLASTY;  Surgeon: Claiborne Crew, MD;  Location: [facility] ORS;  Service: Orthopedics;  Laterality: Left;   TURBINATE REDUCTION     Patient Active  Problem List   Diagnosis Date Noted   Protracted URI 05/03/2023   Acute non-recurrent pansinusitis 02/04/2023   OSA (obstructive sleep apnea) 11/14/2022   Arthritis 11/14/2022   Nocturia 10/05/2022   Hypersomnia 10/05/2022   S/P total hip arthroplasty 05/17/2022   Status post total replacement of right hip 05/15/2022   Overactive bladder 04/04/2022   Postmenopausal estrogen deficiency 11/13/2021   Primary osteoarthritis 11/13/2021   Leg length inequality 11/06/2021   Impaired ambulation 10/23/2021   Contact dermatitis 10/16/2021   SI joint arthritis (HCC) 08/24/2021   Spinal stenosis, lumbar region, without neurogenic claudication 08/24/2021   Degeneration of lumbar intervertebral disc 07/24/2021   S/P total knee arthroplasty, left 07/18/2021   Preop examination 07/07/2021   Right hip pain 07/07/2021   Low back pain 07/07/2021   Drug-induced immunodeficiency (HCC) 06/12/2021   Impingement syndrome of right shoulder region 06/12/2021   COVID-19 01/24/2021   Heart murmur 11/24/2020   Hammer toe 04/22/2020   Metatarsalgia of left foot 04/22/2020   Osteoarthritis of midfoot 04/22/2020   Duodenal ulcer 11/12/2019   Stress 11/12/2019   Morbid obesity (HCC) 11/06/2019   Pain in left knee 07/30/2019   Aortic stenosis, moderate 02/06/2018   Chronic diarrhea 10/09/2017   Acid reflux 07/15/2017   Asymptomatic carotid artery stenosis 07/15/2017   Cyst of skin 07/15/2017   Right maxillary sinusitis 11/09/2016   Immunocompromised (HCC) 11/09/2016   Allergic rhinitis 11/02/2016  Benign essential tremor 11/02/2016   History of hypertension 11/02/2016   Postmenopausal symptoms 07/01/2015   Fatty liver 09/08/2007   Rheumatoid arthritis (HCC) 09/08/2007     REFERRING PROVIDER: Genetta Kenning, MD  REFERRING DIAG: [id number] (ICD-10-CM) - Chronic right shoulder pain  THERAPY DIAG:  Right shoulder pain, unspecified chronicity  Stiffness of right shoulder, not elsewhere  classified  Muscle weakness (generalized)  Rationale for Evaluation and Treatment: Rehabilitation  ONSET DATE: Late 2024/January 2025  SUBJECTIVE:                                                                                                                                                                                      SUBJECTIVE STATEMENT: Pt has had some shoulder pain in the past which goes away with rest.  She began having pain late last year or January of this year, but the pain is not going away.  She denies any specific MOI.  Pt states she has been told she has arthritis and bursitis in the past.  Pt reports having 4 injections over time and reports no significant relief from any of the injections.  Pt saw Dr. Sharl Davies on 4/24.  He gave her an injection and ordered an x ray and PT.  MD note on 4/24 indicated exam consistent with bicipital tendinitis or tendinopathy as well as adhesive capsulitis as well as impingement syndrome.   Pt has been using her L hand a lot with activity due to R shoulder pain.  Pt is very limited with reaching and UE elevation.  Pt has pain and difficulty with self care activities including bathing, dressing, and washing hair.  Pt has much difficulty with reaching behind back.  Pt unable to reach overhead.  Pt is very limited with household chores.     Hand dominance: Right  PERTINENT HISTORY: Chronic LBP, multilevel DDD, SI pain R THA 04/24 L TKA 06/23 RA Right Knee End-stage OA  Bladder incontinence    PAIN:  Location:  deep in shoulder, lateral and post shoulder NPRS:  1-2/10 current, 8/10 worst, 0/10 best   PRECAUTIONS: RA, R THA, L TKA  RED FLAGS: Bowel or bladder incontinence: Yes:     WEIGHT BEARING RESTRICTIONS: No  FALLS:  Has patient fallen in last 6 months? No  LIVING ENVIRONMENT: Lives with: lives alone Lives in: 2 story home though mainly lives on the 1st floor Stairs: yes Has following equipment at home: chair lift on  stairs, walking stick, FWW  OCCUPATION: retired  PLOF: Independent  PATIENT GOALS:to get rid of pain in shoulder and use her R arm.  NEXT MD VISIT:   OBJECTIVE:  Note: Objective  measures were completed at Evaluation unless otherwise noted.  DIAGNOSTIC FINDINGS:  R shoulder x ray:   FINDINGS: There is diffuse decreased bone mineralization. Severe glenohumeral joint space narrowing with bone-on-bone contact, subchondral sclerosis, subchondral cystic change, and moderate superomedial humeral head and glenoid fossa cortical flattening/remodeling.   Mild acromioclavicular joint space narrowing and peripheral osteophytosis.   No acute fracture or dislocation.   The visualized portion of the right lung is unremarkable.   IMPRESSION: 1. Severe glenohumeral osteoarthritis. 2. Mild acromioclavicular osteoarthritis.   PATIENT SURVEYS:  UEFI:  20/80  COGNITION: Overall cognitive status: Within functional limits for tasks assessed      UPPER EXTREMITY ROM:   AROM/PROM Right eval Left eval  Shoulder flexion 62 with pain 152  Shoulder scaption 52 with pain 155  Shoulder abduction 38 with pain 138  Shoulder adduction    Shoulder internal rotation 47/44   Shoulder external rotation 24/22    Elbow flexion    Elbow extension    Wrist flexion    Wrist extension    Wrist ulnar deviation    Wrist radial deviation    Wrist pronation    Wrist supination    (Blank rows = not tested)  UPPER EXTREMITY MMT:  MMT Right eval Left eval  Shoulder flexion  5/5  Shoulder extension    Shoulder abduction  4+/5  Shoulder adduction    Shoulder internal rotation Tolerated min to mod resistance with pain WFL in sitting  Shoulder external rotation Tolerated min resistance with pain Tolerated mod resistance in sitting  Middle trapezius    Lower trapezius    Elbow flexion    Elbow extension    Wrist flexion    Wrist extension    Wrist ulnar deviation    Wrist radial deviation     Wrist pronation    Wrist supination    Grip strength (lbs)    (Blank rows = not tested)  SHOULDER SPECIAL TESTS: ER lag:  negative with arm at side                                                                                                                             TREATMENT:   Pt performed seated tables slides in flexion x 10 reps and seated scap retraction.  PT instructed pt to not perform into a painful range.  Pt received a HEP handout and was educated in correct form and appropriate frequency.   PATIENT EDUCATION: Education details: dx, HEP, POC, exercise form, rationale of interventions, objective findings, and rationale of interventions.   Person educated: Patient Education method: Explanation, Demonstration, Tactile cues, Verbal cues, and Handouts Education comprehension: verbalized understanding, returned demonstration, verbal cues required, tactile cues required, and needs further education  HOME EXERCISE PROGRAM: Access Code: [email or web address] URL: [email or web address] Date: 07/11/2023 Prepared by: Marnie Siren  Exercises - Seated Scapular Retraction  - 2 x daily - 7 x weekly - 2 sets - 10 reps - 3 second  hold - Seated Shoulder Flexion Towel Slide at Table Top  - 2 x daily - 7 x weekly - 2 sets - 10 reps  ASSESSMENT:  CLINICAL IMPRESSION: Patient is a 73 y.o. female with a dx of chronic R shoulder pain.  Pt has had multiple injections without significant relief.  Pt had x rays which showed severe glenohumeral osteoarthritis.  Pt compensates using her L UE with activity due to R shoulder pain.  Pt has pain and difficulty with self care activities and ADLs/IADLs.  She is very limited with reaching and UE elevation.  Pt is unable to reach overhead and is very limited with household chores.  Pt has significant limitations in shoulder ROM and muscle weakness in R UE.  Pt should benefit from skilled PT to address impairments and improve overall function.      OBJECTIVE IMPAIRMENTS: decreased activity tolerance, decreased ROM, decreased strength, hypomobility, impaired flexibility, impaired UE functional use, and pain.   ACTIVITY LIMITATIONS: bathing, dressing, reach over head, and hygiene/grooming  PARTICIPATION LIMITATIONS: meal prep, cleaning, and laundry  PERSONAL FACTORS: 1-2 comorbidities: RA and chronic back pain are also affecting patient's functional outcome.   REHAB POTENTIAL: Good  CLINICAL DECISION MAKING: Stable/uncomplicated  EVALUATION COMPLEXITY: Low   GOALS:  SHORT TERM GOALS: Target date:  07/31/23   Pt will be independent and compliant with HEP for improved pain, ROM, strength, and function.  Baseline: Goal status: INITIAL  2.  Pt will demo at least a 30 deg increase in flexion, scaption, and abd AROM for improved shoulder mobility and stiffness.  Baseline:  Goal status: INITIAL  3.  Pt will report at least a 25% improvement in performance of ADL's/self care activities.  Baseline:  Goal status: INITIAL  4.  Pt will be able to wash and dry her hair without significant difficulty.  Baseline:  Goal status: INITIAL Target date:  08/07/2023    LONG TERM GOALS: Target date: 08/21/23  Pt will be able to reach overhead into a cabinet without significant pain and difficulty.  Baseline:  Goal status: INITIAL  2.  Pt will be able to perform her ADLs and IADLs without significant difficulty and pain. Baseline:  Goal status: INITIAL  3.  Pt will demo improved R shoulder AROM to at least 130 deg in flexion and 50 deg in ER for improved reaching and performance of ADLs.  Baseline:  Goal status: INITIAL  4.  Pt will tolerate strengthening and stability exercises without adverse effects to improve R glenohumeral and scapular strength and stability to perform reaching activities, household chores, and functional lifting activities.   Baseline:  Goal status: INITIAL    PLAN:  PT FREQUENCY: 2x/week  PT  DURATION: 6 weeks  PLANNED INTERVENTIONS: 97164- PT Re-evaluation, 97750- Physical Performance Testing, 97110-Therapeutic exercises, 97530- Therapeutic activity, [id number]- Neuromuscular re-education, 97535- Self Care, 16109- Manual therapy, G0283- Electrical stimulation (unattended), [id number]- Electrical stimulation (manual), [id number]- Ultrasound, Patient/Family education, Taping, Cryotherapy, and Moist heat  PLAN FOR NEXT SESSION: Cont with shoulder ROM, strengthening, and stability per pt tolerance.  Review and perform HEP.   Referring diagnosis? M25.511,G89.29 (ICD-10-CM) - Chronic right shoulder pain  Treatment diagnosis? (if different than referring diagnosis)  Right shoulder pain, unspecified chronicity  Stiffness of right shoulder, not elsewhere classified  Muscle weakness (generalized)  What was this (referring dx) caused by? []  Surgery []  Fall []  Ongoing issue []  Arthritis []  Other: Pt has no specific MOI, but does have arthritis  Laterality: [x]  Rt []  Lt []   Both  Check all possible CPT codes:  *CHOOSE 10 OR LESS*    See Planned Interventions listed in the Plan section of the Evaluation.     Trina Fujita III PT, DPT 07/11/23 11:13 PM

## 2023-07-11 ENCOUNTER — Encounter (HOSPITAL_BASED_OUTPATIENT_CLINIC_OR_DEPARTMENT_OTHER): Payer: Self-pay | Admitting: Physical Therapy

## 2023-07-15 ENCOUNTER — Encounter (HOSPITAL_BASED_OUTPATIENT_CLINIC_OR_DEPARTMENT_OTHER): Payer: Self-pay

## 2023-07-15 ENCOUNTER — Ambulatory Visit (HOSPITAL_BASED_OUTPATIENT_CLINIC_OR_DEPARTMENT_OTHER): Payer: Self-pay

## 2023-07-18 ENCOUNTER — Encounter (HOSPITAL_BASED_OUTPATIENT_CLINIC_OR_DEPARTMENT_OTHER): Payer: Self-pay

## 2023-07-18 ENCOUNTER — Ambulatory Visit (HOSPITAL_BASED_OUTPATIENT_CLINIC_OR_DEPARTMENT_OTHER)

## 2023-07-18 DIAGNOSIS — M25511 Pain in right shoulder: Secondary | ICD-10-CM | POA: Diagnosis not present

## 2023-07-18 DIAGNOSIS — M25611 Stiffness of right shoulder, not elsewhere classified: Secondary | ICD-10-CM

## 2023-07-18 DIAGNOSIS — M6281 Muscle weakness (generalized): Secondary | ICD-10-CM

## 2023-07-18 DIAGNOSIS — G8929 Other chronic pain: Secondary | ICD-10-CM | POA: Diagnosis not present

## 2023-07-18 NOTE — Therapy (Signed)
 OUTPATIENT PHYSICAL THERAPY SHOULDER TREATMENT   Patient Name: Wanda Lang MRN: [id number] DOB:1950-09-17, 73 y.o., female Today's Date: 07/18/2023  END OF SESSION:  PT End of Session - 07/18/23 1152     Visit Number 2    Number of Visits 12    Date for PT Re-Evaluation 08/21/23    Authorization Type human[employer] MCR    Authorization Time Period 07/10/2023-08/31/2023    Authorization - Visit Number 2    Authorization - Number of Visits 15    Progress Note Due on Visit 19    PT Start Time 1151    PT Stop Time 1231    PT Time Calculation (min) 40 min    Activity Tolerance Patient tolerated treatment well    Behavior During Therapy [facility] for tasks assessed/performed         PT End of Session - 07/10/2023    Visit Number 1   Number of Visits 12   Date for PT Re-Evaluation 08/21/2023   Authorization Type [employer] M[employer]   PT Start Time  1104   PT Stop Time 1157   PT Time Calculation (min) 53 min   Activity Tolerance Patient tolerated treatment well   Behavior During Therapy  WFL for tasks assessed/performed    Past Medical History:  Diagnosis Date   Allergy    Arthritis    Coronary artery disease    Fatty liver    GERD (gastroesophageal reflux disease)    Heart murmur    Hypertension    Hx   LIVER FUNCTION TESTS, ABNORMAL, HX OF 09/08/2007   Qualifier: Diagnosis of  By: Nelson-Smith CMA (AAMA), Dottie     Neuromuscular disorder (HCC)    Essential Tremor   Overactive bladder    Past Surgical History:  Procedure Laterality Date   ABDOMINAL HYSTERECTOMY     COLONOSCOPY WITH PROPOFOL  N/A 09/04/2018   Procedure: COLONOSCOPY WITH PROPOFOL ;  Surgeon: Irby Mannan, MD;  Location: [facility];  Service: Endoscopy;  Laterality: N/A;   ESOPHAGOGASTRODUODENOSCOPY (EGD) WITH PROPOFOL  N/A 09/04/2018   Procedure: ESOPHAGOGASTRODUODENOSCOPY (EGD) WITH PROPOFOL ;  Surgeon: Irby Mannan, MD;  Location: [facility];  Service: Endoscopy;  Laterality: N/A;   LAPAROSCOPY      for endometriosis x2   MOUTH SURGERY  04/2023   tooth extraction, gum graft   TONSILLECTOMY     TOTAL HIP ARTHROPLASTY Right 05/15/2022   Procedure: RIGHT TOTAL HIP ARTHROPLASTY ANTERIOR APPROACH;  Surgeon: Arnie Lao, MD;  Location: [facility] OR;  Service: Orthopedics;  Laterality: Right;   TOTAL KNEE ARTHROPLASTY Left 07/18/2021   Procedure: TOTAL KNEE ARTHROPLASTY;  Surgeon: Claiborne Crew, MD;  Location: [facility];  Service: Orthopedics;  Laterality: Left;   TURBINATE REDUCTION     Patient Active Problem List   Diagnosis Date Noted   Protracted URI 05/03/2023   Acute non-recurrent pansinusitis 02/04/2023   OSA (obstructive sleep apnea) 11/14/2022   Arthritis 11/14/2022   Nocturia 10/05/2022   Hypersomnia 10/05/2022   S/P total hip arthroplasty 05/17/2022   Status post total replacement of right hip 05/15/2022   Overactive bladder 04/04/2022   Postmenopausal estrogen deficiency 11/13/2021   Primary osteoarthritis 11/13/2021   Leg length inequality 11/06/2021   Impaired ambulation 10/23/2021   Contact dermatitis 10/16/2021   SI joint arthritis (HCC) 08/24/2021   Spinal stenosis, lumbar region, without neurogenic claudication 08/24/2021   Degeneration of lumbar intervertebral disc 07/24/2021   S/P total knee arthroplasty, left 07/18/2021   Preop examination 07/07/2021   Right hip  pain 07/07/2021   Low back pain 07/07/2021   Drug-induced immunodeficiency (HCC) 06/12/2021   Impingement syndrome of right shoulder region 06/12/2021   COVID-19 01/24/2021   Heart murmur 11/24/2020   Hammer toe 04/22/2020   Metatarsalgia of left foot 04/22/2020   Osteoarthritis of midfoot 04/22/2020   Duodenal ulcer 11/12/2019   Stress 11/12/2019   Morbid obesity (HCC) 11/06/2019   Pain in left knee 07/30/2019   Aortic stenosis, moderate 02/06/2018   Chronic diarrhea 10/09/2017   Acid reflux 07/15/2017   Asymptomatic carotid artery stenosis 07/15/2017   Cyst of skin 07/15/2017    Right maxillary sinusitis 11/09/2016   Immunocompromised (HCC) 11/09/2016   Allergic rhinitis 11/02/2016   Benign essential tremor 11/02/2016   History of hypertension 11/02/2016   Postmenopausal symptoms 07/01/2015   Fatty liver 09/08/2007   Rheumatoid arthritis (HCC) 09/08/2007     REFERRING PROVIDER: Genetta Kenning, MD  REFERRING DIAG: [phone] (ICD-10-CM) - Chronic right shoulder pain  THERAPY DIAG:  Right shoulder pain, unspecified chronicity  Stiffness of right shoulder, not elsewhere classified  Muscle weakness (generalized)  Rationale for Evaluation and Treatment: Rehabilitation  ONSET DATE: Late 2024/January 2025  SUBJECTIVE:                                                                                                                                                                                      SUBJECTIVE STATEMENT:  Pt reports her shoulder stays at 1-2/10 pain level at rest. Has been working on HEP. Some pain initially, but decreases the more she does it.   Eval: Pt has had some shoulder pain in the past which goes away with rest.  She began having pain late last year or January of this year, but the pain is not going away.  She denies any specific MOI.  Pt states she has been told she has arthritis and bursitis in the past.  Pt reports having 4 injections over time and reports no significant relief from any of the injections.  Pt saw Dr. Sharl Davies on 4/24.  He gave her an injection and ordered an x ray and PT.  MD note on 4/24 indicated exam consistent with bicipital tendinitis or tendinopathy as well as adhesive capsulitis as well as impingement syndrome.   Pt has been using her L hand a lot with activity due to R shoulder pain.  Pt is very limited with reaching and UE elevation.  Pt has pain and difficulty with self care activities including bathing, dressing, and washing hair.  Pt has much difficulty with reaching behind back.  Pt unable to reach  overhead.  Pt is very limited with household chores.  Hand dominance: Right  PERTINENT HISTORY: Chronic LBP, multilevel DDD, SI pain R THA 04/24 L TKA 06/23 RA Right Knee End-stage OA  Bladder incontinence    PAIN:  Location:  deep in shoulder, lateral and post shoulder NPRS:  1-2/10 current, 8/10 worst, 0/10 best   PRECAUTIONS: RA, R THA, L TKA  RED FLAGS: Bowel or bladder incontinence: Yes:     WEIGHT BEARING RESTRICTIONS: No  FALLS:  Has patient fallen in last 6 months? No  LIVING ENVIRONMENT: Lives with: lives alone Lives in: 2 story home though mainly lives on the 1st floor Stairs: yes Has following equipment at home: chair lift on stairs, walking stick, FWW  OCCUPATION: retired  PLOF: Independent  PATIENT GOALS:to get rid of pain in shoulder and use her R arm.  NEXT MD VISIT:   OBJECTIVE:  Note: Objective measures were completed at Evaluation unless otherwise noted.  DIAGNOSTIC FINDINGS:  R shoulder x ray:   FINDINGS: There is diffuse decreased bone mineralization. Severe glenohumeral joint space narrowing with bone-on-bone contact, subchondral sclerosis, subchondral cystic change, and moderate superomedial humeral head and glenoid fossa cortical flattening/remodeling.   Mild acromioclavicular joint space narrowing and peripheral osteophytosis.   No acute fracture or dislocation.   The visualized portion of the right lung is unremarkable.   IMPRESSION: 1. Severe glenohumeral osteoarthritis. 2. Mild acromioclavicular osteoarthritis.   PATIENT SURVEYS:  UEFI:  20/80  COGNITION: Overall cognitive status: Within functional limits for tasks assessed      UPPER EXTREMITY ROM:   AROM/PROM Right eval Left eval  Shoulder flexion 62 with pain 152  Shoulder scaption 52 with pain 155  Shoulder abduction 38 with pain 138  Shoulder adduction    Shoulder internal rotation 47/44   Shoulder external rotation 24/22    Elbow flexion    Elbow  extension    Wrist flexion    Wrist extension    Wrist ulnar deviation    Wrist radial deviation    Wrist pronation    Wrist supination    (Blank rows = not tested)  UPPER EXTREMITY MMT:  MMT Right eval Left eval  Shoulder flexion  5/5  Shoulder extension    Shoulder abduction  4+/5  Shoulder adduction    Shoulder internal rotation Tolerated min to mod resistance with pain WFL in sitting  Shoulder external rotation Tolerated min resistance with pain Tolerated mod resistance in sitting  Middle trapezius    Lower trapezius    Elbow flexion    Elbow extension    Wrist flexion    Wrist extension    Wrist ulnar deviation    Wrist radial deviation    Wrist pronation    Wrist supination    Grip strength (lbs)    (Blank rows = not tested)  SHOULDER SPECIAL TESTS: ER lag:  negative with arm at side  TREATMENT:  6/12 PROM R shoulder  STM to UT, bicep, pec GHJ distraction Supine chest press with cane (attempted, but too painful) Seated scapular retraction 5 2x10 Seated posterior shoulder rolls x15 Isometrics-flexion/abduction/IR/ER/extension- 5 hold x10ea     Eval: Pt performed seated tables slides in flexion x 10 reps and seated scap retraction.  PT instructed pt to not perform into a painful range.  Pt received a HEP handout and was educated in correct form and appropriate frequency.   PATIENT EDUCATION: Education details: dx, HEP, POC, exercise form, rationale of interventions, objective findings, and rationale of interventions.   Person educated: Patient Education method: Explanation, Demonstration, Tactile cues, Verbal cues, and Handouts Education comprehension: verbalized understanding, returned demonstration, verbal cues required, tactile cues required, and needs further education  HOME EXERCISE PROGRAM: Access Code: [email or web address] URL:  [email or web address] Date: 07/11/2023 Prepared by: Marnie Siren  Exercises - Seated Scapular Retraction  - 2 x daily - 7 x weekly - 2 sets - 10 reps - 3 second hold - Seated Shoulder Flexion Towel Slide at Table Top  - 2 x daily - 7 x weekly - 2 sets - 10 reps  ASSESSMENT:  CLINICAL IMPRESSION:  Pt with poor tolerance for passive abduction due to pain. Able to tolerate limited range with passive shoulder flexion. Good response to GHJ distraction. Tightness noted in pec, UT, and bicep, so spent time on STM to address this. Pt reported clunking in R shoulder joint with attempted supine cane press. Trialed isometric strengthening with good tolerance. Will plan to add isometrics to HEP at next visit after assessing response to this.   Eval: Patient is a 73 y.o. female with a dx of chronic R shoulder pain.  Pt has had multiple injections without significant relief.  Pt had x rays which showed severe glenohumeral osteoarthritis.  Pt compensates using her L UE with activity due to R shoulder pain.  Pt has pain and difficulty with self care activities and ADLs/IADLs.  She is very limited with reaching and UE elevation.  Pt is unable to reach overhead and is very limited with household chores.  Pt has significant limitations in shoulder ROM and muscle weakness in R UE.  Pt should benefit from skilled PT to address impairments and improve overall function.     OBJECTIVE IMPAIRMENTS: decreased activity tolerance, decreased ROM, decreased strength, hypomobility, impaired flexibility, impaired UE functional use, and pain.   ACTIVITY LIMITATIONS: bathing, dressing, reach over head, and hygiene/grooming  PARTICIPATION LIMITATIONS: meal prep, cleaning, and laundry  PERSONAL FACTORS: 1-2 comorbidities: RA and chronic back pain are also affecting patient's functional outcome.   REHAB POTENTIAL: Good  CLINICAL DECISION MAKING: Stable/uncomplicated  EVALUATION COMPLEXITY:  Low   GOALS:  SHORT TERM GOALS: Target date:  07/31/23   Pt will be independent and compliant with HEP for improved pain, ROM, strength, and function.  Baseline: Goal status: INITIAL  2.  Pt will demo at least a 30 deg increase in flexion, scaption, and abd AROM for improved shoulder mobility and stiffness.  Baseline:  Goal status: INITIAL  3.  Pt will report at least a 25% improvement in performance of ADL's/self care activities.  Baseline:  Goal status: INITIAL  4.  Pt will be able to wash and dry her hair without significant difficulty.  Baseline:  Goal status: INITIAL Target date:  08/07/2023    LONG TERM GOALS: Target date: 08/21/23  Pt will be able to reach overhead into a cabinet without significant pain and difficulty.  Baseline:  Goal status: INITIAL  2.  Pt will be able to perform her ADLs and IADLs without significant difficulty and pain. Baseline:  Goal status: INITIAL  3.  Pt will demo improved R shoulder AROM to at least 130 deg in flexion and 50 deg in ER for improved reaching and performance of ADLs.  Baseline:  Goal status: INITIAL  4.  Pt will tolerate strengthening and stability exercises without adverse effects to improve R glenohumeral and scapular strength and stability to perform reaching activities, household chores, and functional lifting activities.   Baseline:  Goal status: INITIAL    PLAN:  PT FREQUENCY: 2x/week  PT DURATION: 6 weeks  PLANNED INTERVENTIONS: 97164- PT Re-evaluation, 97750- Physical Performance Testing, 97110-Therapeutic exercises, 97530- Therapeutic activity, [id number]- Neuromuscular re-education, 97535- Self Care, 32440- Manual therapy, [id number]- Electrical stimulation (unattended), [id number]- Electrical stimulation (manual), [id number]- Ultrasound, Patient/Family education, Taping, Cryotherapy, and Moist heat  PLAN FOR NEXT SESSION: Cont with shoulder ROM, strengthening, and stability per pt tolerance.  Review and perform HEP.    Referring diagnosis? M25.511,G89.29 (ICD-10-CM) - Chronic right shoulder pain  Treatment diagnosis? (if different than referring diagnosis)  Right shoulder pain, unspecified chronicity  Stiffness of right shoulder, not elsewhere classified  Muscle weakness (generalized)  What was this (referring dx) caused by? []  Surgery []  Fall []  Ongoing issue []  Arthritis []  Other: Pt has no specific MOI, but does have arthritis  Laterality: [x]  Rt []  Lt []  Both  Check all possible CPT codes:  *CHOOSE 10 OR LESS*    See Planned Interventions listed in the Plan section of the Evaluation.     Herb Loges, PTA  07/18/23 2:53 PM

## 2023-07-24 DIAGNOSIS — M9904 Segmental and somatic dysfunction of sacral region: Secondary | ICD-10-CM | POA: Diagnosis not present

## 2023-07-24 DIAGNOSIS — M9902 Segmental and somatic dysfunction of thoracic region: Secondary | ICD-10-CM | POA: Diagnosis not present

## 2023-07-24 DIAGNOSIS — M9903 Segmental and somatic dysfunction of lumbar region: Secondary | ICD-10-CM | POA: Diagnosis not present

## 2023-07-24 DIAGNOSIS — M9901 Segmental and somatic dysfunction of cervical region: Secondary | ICD-10-CM | POA: Diagnosis not present

## 2023-07-25 ENCOUNTER — Telehealth: Payer: Self-pay

## 2023-07-25 ENCOUNTER — Encounter (HOSPITAL_BASED_OUTPATIENT_CLINIC_OR_DEPARTMENT_OTHER): Payer: Self-pay | Admitting: Physical Therapy

## 2023-07-25 ENCOUNTER — Ambulatory Visit (HOSPITAL_BASED_OUTPATIENT_CLINIC_OR_DEPARTMENT_OTHER): Payer: Self-pay | Admitting: Physical Therapy

## 2023-07-25 DIAGNOSIS — M25611 Stiffness of right shoulder, not elsewhere classified: Secondary | ICD-10-CM | POA: Diagnosis not present

## 2023-07-25 DIAGNOSIS — R2689 Other abnormalities of gait and mobility: Secondary | ICD-10-CM

## 2023-07-25 DIAGNOSIS — M25511 Pain in right shoulder: Secondary | ICD-10-CM

## 2023-07-25 DIAGNOSIS — M6281 Muscle weakness (generalized): Secondary | ICD-10-CM | POA: Diagnosis not present

## 2023-07-25 DIAGNOSIS — G8929 Other chronic pain: Secondary | ICD-10-CM | POA: Diagnosis not present

## 2023-07-25 NOTE — Therapy (Signed)
 OUTPATIENT PHYSICAL THERAPY SHOULDER TREATMENT   Patient Name: Wanda Lang MRN: [id number] DOB:[birth year], 73 y.o., female Today's Date: 07/25/2023  END OF SESSION:  PT End of Session - 07/25/23 1436     Visit Number 3    Number of Visits 12    Date for PT Re-Evaluation 08/21/23    Authorization Type humana MCR    Authorization Time Period 07/10/2023-08/31/2023    Authorization - Number of Visits 15    Progress Note Due on Visit 19    PT Start Time 1434    PT Stop Time 1512    PT Time Calculation (min) 38 min    Activity Tolerance Patient tolerated treatment well    Behavior During Therapy [facility] for tasks assessed/performed         PT End of Session - 07/10/2023    Visit Number 1   Number of Visits 12   Date for PT Re-Evaluation 08/21/2023   Authorization Type [employer] MCR   PT Start Time  1104   PT Stop Time 1157   PT Time Calculation (min) 53 min   Activity Tolerance Patient tolerated treatment well   Behavior During Therapy  WFL for tasks assessed/performed    Past Medical History:  Diagnosis Date   Allergy    Arthritis    Coronary artery disease    Fatty liver    GERD (gastroesophageal reflux disease)    Heart murmur    Hypertension    Hx   LIVER FUNCTION TESTS, ABNORMAL, HX OF 09/08/2007   Qualifier: Diagnosis of  By: Nelson-Smith CMA (AAMA), Dottie     Neuromuscular disorder (HCC)    Essential Tremor   Overactive bladder    Past Surgical History:  Procedure Laterality Date   ABDOMINAL HYSTERECTOMY     COLONOSCOPY WITH PROPOFOL  N/A 09/04/2018   Procedure: COLONOSCOPY WITH PROPOFOL ;  Surgeon: Irby Mannan, MD;  Location: [facility] END[facility];  Service: Endoscopy;  Laterality: N/A;   ESOPHAGOGASTRODUODENOSCOPY (EGD) WITH PROPOFOL  N/A 09/04/2018   Procedure: ESOPHAGOGASTRODUODENOSCOPY (EGD) WITH PROPOFOL ;  Surgeon: Irby Mannan, MD;  Location: [facility] END[facility];  Service: Endoscopy;  Laterality: N/A;   LAPAROSCOPY     for endometriosis x2   MOUTH  SURGERY  04/2023   tooth extraction, gum graft   TONSILLECTOMY     TOTAL HIP ARTHROPLASTY Right 05/15/2022   Procedure: RIGHT TOTAL HIP ARTHROPLASTY ANTERIOR APPROACH;  Surgeon: Arnie Lao, MD;  Location: [facility];  Service: Orthopedics;  Laterality: Right;   TOTAL KNEE ARTHROPLASTY Left 07/18/2021   Procedure: TOTAL KNEE ARTHROPLASTY;  Surgeon: Claiborne Crew, MD;  Location: [facility];  Service: Orthopedics;  Laterality: Left;   TURBINATE REDUCTION     Patient Active Problem List   Diagnosis Date Noted   Protracted URI 05/03/2023   Acute non-recurrent pansinusitis 02/04/2023   OSA (obstructive sleep apnea) 11/14/2022   Arthritis 11/14/2022   Nocturia 10/05/2022   Hypersomnia 10/05/2022   S/P total hip arthroplasty 05/17/2022   Status post total replacement of right hip 05/15/2022   Overactive bladder 04/04/2022   Postmenopausal estrogen deficiency 11/13/2021   Primary osteoarthritis 11/13/2021   Leg length inequality 11/06/2021   Impaired ambulation 10/23/2021   Contact dermatitis 10/16/2021   SI joint arthritis (HCC) 08/24/2021   Spinal stenosis, lumbar region, without neurogenic claudication 08/24/2021   Degeneration of lumbar intervertebral disc 07/24/2021   S/P total knee arthroplasty, left 07/18/2021   Preop examination 07/07/2021   Right hip pain 07/07/2021   Low back pain 07/07/2021  Drug-induced immunodeficiency (HCC) 06/12/2021   Impingement syndrome of right shoulder region 06/12/2021   COVID-19 01/24/2021   Heart murmur 11/24/2020   Hammer toe 04/22/2020   Metatarsalgia of left foot 04/22/2020   Osteoarthritis of midfoot 04/22/2020   Duodenal ulcer 11/12/2019   Stress 11/12/2019   Morbid obesity (HCC) 11/06/2019   Pain in left knee 07/30/2019   Aortic stenosis, moderate 02/06/2018   Chronic diarrhea 10/09/2017   Acid reflux 07/15/2017   Asymptomatic carotid artery stenosis 07/15/2017   Cyst of skin 07/15/2017   Right maxillary sinusitis 11/09/2016    Immunocompromised (HCC) 11/09/2016   Allergic rhinitis 11/02/2016   Benign essential tremor 11/02/2016   History of hypertension 11/02/2016   Postmenopausal symptoms 07/01/2015   Fatty liver 09/08/2007   Rheumatoid arthritis (HCC) 09/08/2007     REFERRING PROVIDER: Genetta Kenning, MD  REFERRING DIAG: [id number] (ICD-10-CM) - Chronic right shoulder pain  THERAPY DIAG:  Right shoulder pain, unspecified chronicity  Stiffness of right shoulder, not elsewhere classified  Muscle weakness (generalized)  Other abnormalities of gait and mobility  Rationale for Evaluation and Treatment: Rehabilitation  ONSET DATE: Late 2024/January 2025  SUBJECTIVE:                                                                                                                                                                                      SUBJECTIVE STATEMENT:  Pt reports Shoulder is still stiff and painful.   Eval: Pt has had some shoulder pain in the past which goes away with rest.  She began having pain late last year or January of this year, but the pain is not going away.  She denies any specific MOI.  Pt states she has been told she has arthritis and bursitis in the past.  Pt reports having 4 injections over time and reports no significant relief from any of the injections.  Pt saw Dr. Sharl Davies on 4/24.  He gave her an injection and ordered an x ray and PT.  MD note on 4/24 indicated exam consistent with bicipital tendinitis or tendinopathy as well as adhesive capsulitis as well as impingement syndrome.   Pt has been using her L hand a lot with activity due to R shoulder pain.  Pt is very limited with reaching and UE elevation.  Pt has pain and difficulty with self care activities including bathing, dressing, and washing hair.  Pt has much difficulty with reaching behind back.  Pt unable to reach overhead.  Pt is very limited with household chores.     Hand dominance:  Right  PERTINENT HISTORY: Chronic LBP, multilevel DDD, SI pain R THA 04/24 L TKA 06/23  RA Right Knee End-stage OA  Bladder incontinence    PAIN:  Location:  deep in shoulder, lateral and post shoulder NPRS:  1-2/10 current, 8/10 worst, 0/10 best   PRECAUTIONS: RA, R THA, L TKA  RED FLAGS: Bowel or bladder incontinence: Yes:     WEIGHT BEARING RESTRICTIONS: No  FALLS:  Has patient fallen in last 6 months? No  LIVING ENVIRONMENT: Lives with: lives alone Lives in: 2 story home though mainly lives on the 1st floor Stairs: yes Has following equipment at home: chair lift on stairs, walking stick, FWW  OCCUPATION: retired  PLOF: Independent  PATIENT GOALS:to get rid of pain in shoulder and use her R arm.  NEXT MD VISIT:   OBJECTIVE:  Note: Objective measures were completed at Evaluation unless otherwise noted.  DIAGNOSTIC FINDINGS:  R shoulder x ray:   FINDINGS: There is diffuse decreased bone mineralization. Severe glenohumeral joint space narrowing with bone-on-bone contact, subchondral sclerosis, subchondral cystic change, and moderate superomedial humeral head and glenoid fossa cortical flattening/remodeling.   Mild acromioclavicular joint space narrowing and peripheral osteophytosis.   No acute fracture or dislocation.   The visualized portion of the right lung is unremarkable.   IMPRESSION: 1. Severe glenohumeral osteoarthritis. 2. Mild acromioclavicular osteoarthritis.   PATIENT SURVEYS:  UEFI:  20/80  COGNITION: Overall cognitive status: Within functional limits for tasks assessed      UPPER EXTREMITY ROM:   AROM/PROM Right eval Left eval  Shoulder flexion 62 with pain 152  Shoulder scaption 52 with pain 155  Shoulder abduction 38 with pain 138  Shoulder adduction    Shoulder internal rotation 47/44   Shoulder external rotation 24/22    Elbow flexion    Elbow extension    Wrist flexion    Wrist extension    Wrist ulnar deviation     Wrist radial deviation    Wrist pronation    Wrist supination    (Blank rows = not tested)  UPPER EXTREMITY MMT:  MMT Right eval Left eval  Shoulder flexion  5/5  Shoulder extension    Shoulder abduction  4+/5  Shoulder adduction    Shoulder internal rotation Tolerated min to mod resistance with pain WFL in sitting  Shoulder external rotation Tolerated min resistance with pain Tolerated mod resistance in sitting  Middle trapezius    Lower trapezius    Elbow flexion    Elbow extension    Wrist flexion    Wrist extension    Wrist ulnar deviation    Wrist radial deviation    Wrist pronation    Wrist supination    Grip strength (lbs)    (Blank rows = not tested)  SHOULDER SPECIAL TESTS: ER lag:  negative with arm at side                                                                                                                             TREATMENT:  07/25/23 PROM R  shoulder  STM to UT, bicep, pec GHJ distraction Supine AAROM flexion to 90 1 x 10 Isometrics-flexion/abduction/extension- x 10 second holds  6/12 PROM R shoulder  STM to UT, bicep, pec GHJ distraction Supine chest press with cane (attempted, but too painful) Seated scapular retraction 5 2x10 Seated posterior shoulder rolls x15 Isometrics-flexion/abduction/IR/ER/extension- 5 hold x10ea     Eval: Pt performed seated tables slides in flexion x 10 reps and seated scap retraction.  PT instructed pt to not perform into a painful range.  Pt received a HEP handout and was educated in correct form and appropriate frequency.   PATIENT EDUCATION: Education details: dx, HEP, POC, exercise form, rationale of interventions, objective findings, and rationale of interventions.   Person educated: Patient Education method: Explanation, Demonstration, Tactile cues, Verbal cues, and Handouts Education comprehension: verbalized understanding, returned demonstration, verbal cues required, tactile cues required,  and needs further education  HOME EXERCISE PROGRAM: Access Code: [email or web address] URL: [email or web address] Date: 07/11/2023 Prepared by: Marnie Siren  Exercises - Seated Scapular Retraction  - 2 x daily - 7 x weekly - 2 sets - 10 reps - 3 second hold - Seated Shoulder Flexion Towel Slide at Table Top  - 2 x daily - 7 x weekly - 2 sets - 10 reps  ASSESSMENT:  CLINICAL IMPRESSION:  Continued with exercises and manual for mobility. Patient able to get up to about 90 flexion today AAROM. Patient will continue to benefit from physical therapy in order to improve function and reduce impairment.   Eval: Patient is a 73 y.o. female with a dx of chronic R shoulder pain.  Pt has had multiple injections without significant relief.  Pt had x rays which showed severe glenohumeral osteoarthritis.  Pt compensates using her L UE with activity due to R shoulder pain.  Pt has pain and difficulty with self care activities and ADLs/IADLs.  She is very limited with reaching and UE elevation.  Pt is unable to reach overhead and is very limited with household chores.  Pt has significant limitations in shoulder ROM and muscle weakness in R UE.  Pt should benefit from skilled PT to address impairments and improve overall function.     OBJECTIVE IMPAIRMENTS: decreased activity tolerance, decreased ROM, decreased strength, hypomobility, impaired flexibility, impaired UE functional use, and pain.   ACTIVITY LIMITATIONS: bathing, dressing, reach over head, and hygiene/grooming  PARTICIPATION LIMITATIONS: meal prep, cleaning, and laundry  PERSONAL FACTORS: 1-2 comorbidities: RA and chronic back pain are also affecting patient's functional outcome.   REHAB POTENTIAL: Good  CLINICAL DECISION MAKING: Stable/uncomplicated  EVALUATION COMPLEXITY: Low   GOALS:  SHORT TERM GOALS: Target date:  07/31/23   Pt will be independent and compliant with HEP for improved pain, ROM, strength, and function.   Baseline: Goal status: INITIAL  2.  Pt will demo at least a 30 deg increase in flexion, scaption, and abd AROM for improved shoulder mobility and stiffness.  Baseline:  Goal status: INITIAL  3.  Pt will report at least a 25% improvement in performance of ADL's/self care activities.  Baseline:  Goal status: INITIAL  4.  Pt will be able to wash and dry her hair without significant difficulty.  Baseline:  Goal status: INITIAL Target date:  08/07/2023    LONG TERM GOALS: Target date: 08/21/23  Pt will be able to reach overhead into a cabinet without significant pain and difficulty.  Baseline:  Goal status: INITIAL  2.  Pt will be able to perform her ADLs and  IADLs without significant difficulty and pain. Baseline:  Goal status: INITIAL  3.  Pt will demo improved R shoulder AROM to at least 130 deg in flexion and 50 deg in ER for improved reaching and performance of ADLs.  Baseline:  Goal status: INITIAL  4.  Pt will tolerate strengthening and stability exercises without adverse effects to improve R glenohumeral and scapular strength and stability to perform reaching activities, household chores, and functional lifting activities.   Baseline:  Goal status: INITIAL    PLAN:  PT FREQUENCY: 2x/week  PT DURATION: 6 weeks  PLANNED INTERVENTIONS: 97164- PT Re-evaluation, 97750- Physical Performance Testing, 97110-Therapeutic exercises, 97530- Therapeutic activity, [id number]- Neuromuscular re-education, 97535- Self Care, 01027- Manual therapy, G0283- Electrical stimulation (unattended), [id number]- Electrical stimulation (manual), [id number]- Ultrasound, Patient/Family education, Taping, Cryotherapy, and Moist heat  PLAN FOR NEXT SESSION: Cont with shoulder ROM, strengthening, and stability per pt tolerance.  Review and perform HEP.   Referring diagnosis? M25.511,G89.29 (ICD-10-CM) - Chronic right shoulder pain  Treatment diagnosis? (if different than referring diagnosis)  Right shoulder  pain, unspecified chronicity  Stiffness of right shoulder, not elsewhere classified  Muscle weakness (generalized)  What was this (referring dx) caused by? []  Surgery []  Fall []  Ongoing issue []  Arthritis []  Other: Pt has no specific MOI, but does have arthritis  Laterality: [x]  Rt []  Lt []  Both  Check all possible CPT codes:  *CHOOSE 10 OR LESS*    See Planned Interventions listed in the Plan section of the Evaluation.

## 2023-07-25 NOTE — Telephone Encounter (Signed)
 Sent request for Mammogram to [facility].

## 2023-07-26 ENCOUNTER — Encounter: Payer: Self-pay | Admitting: Physical Medicine & Rehabilitation

## 2023-07-26 ENCOUNTER — Encounter: Attending: Physical Medicine & Rehabilitation | Admitting: Physical Medicine & Rehabilitation

## 2023-07-26 VITALS — BP 113/77 | HR 81 | Ht 64.0 in | Wt 236.8 lb

## 2023-07-26 DIAGNOSIS — M19011 Primary osteoarthritis, right shoulder: Secondary | ICD-10-CM | POA: Insufficient documentation

## 2023-07-26 DIAGNOSIS — M0589 Other rheumatoid arthritis with rheumatoid factor of multiple sites: Secondary | ICD-10-CM | POA: Diagnosis not present

## 2023-07-26 NOTE — Patient Instructions (Addendum)
 Dr Hermina Loosen shoulder - referral placed  Dr Jearline Minder, please call [facility] office same as Dr Lucienne Ryder office

## 2023-07-26 NOTE — Progress Notes (Signed)
 Subjective:    Patient ID: Wanda Lang, female    DOB: December 26, 1950, 73 y.o.   MRN: [id number]  HPI 73 year old female with morbid obesity and polyarticular osteoarthritis who returns today with primary complaint of right shoulder pain.  She has a secondary complaint of left foot pain. She has  recently undergone right hip arthroplasty with Dr. Lucienne Ryder which has helped both her right groin pain as well as her right sacroiliac pain.  She still has some occasional low back pain but this is not as severe as it was before.  She is using Lidoderm  patches to help with her pain. At last visit she had complaints of right shoulder pain.  She had positive impingement sign as well as limited range of motion of the shoulder and subacromial bursa injection was performed which was partially helpful.  She had x-rays performed which demonstrated severe glenohumeral osteoarthritis.  She was sent to physical therapy.  She overall feels better but still is very limited in terms of her functional usage of the right upper extremity. Her other complaints include left foot pain she has toe pain due to hammertoes.  She has not seen a podiatrist in the past and was offered an amputation however the patient declined and would like a another opinion Also the patient has leg length discrepancy she has had some improvements with built-up shoe on the right side.  She has noticed that now her left side seems a little too short and has put 1/8 inch heel lift on the left side this seems to be helping a little bit but she is not sure it is thick enough Pain Inventory Average Pain 2 Pain Right Now 2 My pain is intermittent, sharp, stabbing, and aching  In the last 24 hours, has pain interfered with the following? General activity 7 Relation with others 0 Enjoyment of life 5 What TIME of day is your pain at its worst? daytime Sleep (in general) Poor  Pain is worse with: walking, bending, and standing Pain improves with:  rest, therapy/exercise, medication, and injections Relief from Meds: 7  Family History  Problem Relation Age of Onset   Alcohol abuse Mother    Arthritis Mother    Hyperlipidemia Mother    Heart disease Mother    Stroke Mother    Hypertension Mother    Depression Mother    Anxiety disorder Mother    Arthritis Father    Lung cancer Paternal Grandfather    Kidney disease Paternal Grandfather    Social History   Socioeconomic History   Marital status: Widowed    Spouse name: Not on file   Number of children: Not on file   Years of education: Not on file   Highest education level: Bachelor's degree (e.g., BA, AB, BS)  Occupational History   Not on file  Tobacco Use   Smoking status: Never    Passive exposure: Never   Smokeless tobacco: Never  Vaping Use   Vaping status: Never Used  Substance and Sexual Activity   Alcohol use: Not Currently    Alcohol/week: 1.0 standard drink of alcohol    Types: 1 Glasses of wine per week   Drug use: No   Sexual activity: Not Currently  Other Topics Concern   Not on file  Social History Narrative   Not on file   Social Drivers of Health   Financial Resource Strain: Low Risk  (05/02/2023)   Overall Financial Resource Strain (CARDIA)    Difficulty of  Paying Living Expenses: Not hard at all  Food Insecurity: No Food Insecurity (05/02/2023)   Hunger Vital Sign    Worried About Running Out of Food in the Last Year: Never true    Ran Out of Food in the Last Year: Never true  Transportation Needs: No Transportation Needs (05/02/2023)   PRAPARE - Administrator, Civil Service (Medical): No    Lack of Transportation (Non-Medical): No  Physical Activity: Inactive (05/02/2023)   Exercise Vital Sign    Days of Exercise per Week: 0 days    Minutes of Exercise per Session: 20 min  Stress: No Stress Concern Present (05/02/2023)   [employer] of Occupational Health - Occupational Stress Questionnaire    Feeling of Stress : Only  a little  Social Connections: Moderately Integrated (05/02/2023)   Social Connection and Isolation Panel    Frequency of Communication with Friends and Family: More than three times a week    Frequency of Social Gatherings with Friends and Family: More than three times a week    Attends Religious Services: More than 4 times per year    Active Member of Clubs or Organizations: Yes    Attends Banker Meetings: More than 4 times per year    Marital Status: Divorced   Past Surgical History:  Procedure Laterality Date   ABDOMINAL HYSTERECTOMY     COLONOSCOPY WITH PROPOFOL  N/A 09/04/2018   Procedure: COLONOSCOPY WITH PROPOFOL ;  Surgeon: Irby Mannan, MD;  Location: [facility];  Service: Endoscopy;  Laterality: N/A;   ESOPHAGOGASTRODUODENOSCOPY (EGD) WITH PROPOFOL  N/A 09/04/2018   Procedure: ESOPHAGOGASTRODUODENOSCOPY (EGD) WITH PROPOFOL ;  Surgeon: Irby Mannan, MD;  Location: [facility];  Service: Endoscopy;  Laterality: N/A;   LAPAROSCOPY     for endometriosis x2   MOUTH SURGERY  04/2023   tooth extraction, gum graft   TONSILLECTOMY     TOTAL HIP ARTHROPLASTY Right 05/15/2022   Procedure: RIGHT TOTAL HIP ARTHROPLASTY ANTERIOR APPROACH;  Surgeon: Arnie Lao, MD;  Location: [facility];  Service: Orthopedics;  Laterality: Right;   TOTAL KNEE ARTHROPLASTY Left 07/18/2021   Procedure: TOTAL KNEE ARTHROPLASTY;  Surgeon: Claiborne Crew, MD;  Location: [facility];  Service: Orthopedics;  Laterality: Left;   TURBINATE REDUCTION     Past Surgical History:  Procedure Laterality Date   ABDOMINAL HYSTERECTOMY     COLONOSCOPY WITH PROPOFOL  N/A 09/04/2018   Procedure: COLONOSCOPY WITH PROPOFOL ;  Surgeon: Irby Mannan, MD;  Location: [facility];  Service: Endoscopy;  Laterality: N/A;   ESOPHAGOGASTRODUODENOSCOPY (EGD) WITH PROPOFOL  N/A 09/04/2018   Procedure: ESOPHAGOGASTRODUODENOSCOPY (EGD) WITH PROPOFOL ;  Surgeon: Irby Mannan, MD;  Location:  [facility];  Service: Endoscopy;  Laterality: N/A;   LAPAROSCOPY     for endometriosis x2   MOUTH SURGERY  04/2023   tooth extraction, gum graft   TONSILLECTOMY     TOTAL HIP ARTHROPLASTY Right 05/15/2022   Procedure: RIGHT TOTAL HIP ARTHROPLASTY ANTERIOR APPROACH;  Surgeon: Arnie Lao, MD;  Location: [facility];  Service: Orthopedics;  Laterality: Right;   TOTAL KNEE ARTHROPLASTY Left 07/18/2021   Procedure: TOTAL KNEE ARTHROPLASTY;  Surgeon: Claiborne Crew, MD;  Location: [facility];  Service: Orthopedics;  Laterality: Left;   TURBINATE REDUCTION     Past Medical History:  Diagnosis Date   Allergy    Arthritis    Coronary artery disease    Fatty liver    GERD (gastroesophageal reflux disease)    Heart murmur  Hypertension    Hx   LIVER FUNCTION TESTS, ABNORMAL, HX OF 09/08/2007   Qualifier: Diagnosis of  By: Nelson-Smith CMA (AAMA), Dottie     Neuromuscular disorder [facility])    Essential Tremor   Overactive bladder    BP 113/77   Pulse 81   Ht 5' 4 (1.626 m)   Wt 236 lb 12.8 oz (107.4 kg)   SpO2 93%   BMI 40.65 kg/m   Opioid Risk Score:   Fall Risk Score:  `1  Depression screen [facility] 2/9     05/30/2023    3:40 PM 05/17/2023    2:42 PM 05/03/2023   10:29 AM 02/04/2023    9:35 AM 11/14/2022    1:13 PM 10/05/2022    8:36 AM 07/19/2022   10:15 AM  Depression screen PHQ 2/9  Decreased Interest 0 0 1 0 0 0 0  Down, Depressed, Hopeless 0 0 0 0 0 0 0  PHQ - 2 Score 0 0 1 0 0 0 0  Altered sleeping    0 3 0 3  Tired, decreased energy    0 1 0 1  Change in appetite    0 0 0 0  Feeling bad or failure about yourself     0 0 0 0  Trouble concentrating    0 3 0 0  Moving slowly or fidgety/restless    0 0 0 0  Suicidal thoughts    0 0 0 0  PHQ-9 Score    0 7 0 4  Difficult doing work/chores    Not difficult at all Somewhat difficult Not difficult at all Not difficult at all     Review of Systems  Musculoskeletal:  Positive for back pain.       Both shoulders and  both feet  Psychiatric/Behavioral:  Positive for dysphoric mood.   All other systems reviewed and are negative.      Objective:   Physical Exam General Pleasant morbidly obese female no acute distress Mood and affect appropriate Right shoulder abduction limited to 90 degrees external rotation limited to 20 degrees forward flexion limited to 90 degrees Lumbar spine no evidence of scoliosis. Lumbar range of motion normal forward flexion extension is approximately 25% of normal.  She has more pain with extension than with flexion. Negative straight leg raising bilaterally Hip range of motion on the right side was performed gently given history of fairly recent hip replacement. Negative Faber's on the left side negative distraction test negative       Assessment & Plan:   #1.  Diffuse osteoarthritis she has done very well with her right hip replacement done by Dr. Lucienne Ryder approximately 1 year ago.  She has severe osteoarthritis of the right glenohumeral joint with severe restrictions in shoulder range of motion and functional usage.  I do think she should be evaluated by orthopedic surgery for this.  She request Dr. Hermina Loosen at [facility] and have made referral 2.  Chronic low back pain overall improved since her hip surgery she does have some mild SI issues as well as some lumbar spondylosis but overall doing quite well. 3.  Toe pain due to hammertoes has seen podiatry in the past, she may benefit from orthopedic evaluation with Dr. Julio Ohm I asked her to call me if she has residual pain issues after orthopedic evaluation treatment

## 2023-07-29 ENCOUNTER — Ambulatory Visit (HOSPITAL_BASED_OUTPATIENT_CLINIC_OR_DEPARTMENT_OTHER): Payer: Self-pay

## 2023-07-29 ENCOUNTER — Ambulatory Visit: Admitting: Orthopedic Surgery

## 2023-07-29 ENCOUNTER — Encounter (HOSPITAL_BASED_OUTPATIENT_CLINIC_OR_DEPARTMENT_OTHER): Payer: Self-pay

## 2023-07-29 ENCOUNTER — Encounter: Payer: Self-pay | Admitting: Orthopedic Surgery

## 2023-07-29 DIAGNOSIS — M205X2 Other deformities of toe(s) (acquired), left foot: Secondary | ICD-10-CM

## 2023-07-29 DIAGNOSIS — M9902 Segmental and somatic dysfunction of thoracic region: Secondary | ICD-10-CM | POA: Diagnosis not present

## 2023-07-29 DIAGNOSIS — M25511 Pain in right shoulder: Secondary | ICD-10-CM | POA: Diagnosis not present

## 2023-07-29 DIAGNOSIS — M6281 Muscle weakness (generalized): Secondary | ICD-10-CM | POA: Diagnosis not present

## 2023-07-29 DIAGNOSIS — M9901 Segmental and somatic dysfunction of cervical region: Secondary | ICD-10-CM | POA: Diagnosis not present

## 2023-07-29 DIAGNOSIS — M9903 Segmental and somatic dysfunction of lumbar region: Secondary | ICD-10-CM | POA: Diagnosis not present

## 2023-07-29 DIAGNOSIS — M25611 Stiffness of right shoulder, not elsewhere classified: Secondary | ICD-10-CM | POA: Diagnosis not present

## 2023-07-29 DIAGNOSIS — M9904 Segmental and somatic dysfunction of sacral region: Secondary | ICD-10-CM | POA: Diagnosis not present

## 2023-07-29 DIAGNOSIS — G8929 Other chronic pain: Secondary | ICD-10-CM | POA: Diagnosis not present

## 2023-07-29 NOTE — Progress Notes (Signed)
 Office Visit Note   Patient: Wanda Lang           Date of Birth: 29-May-1950           MRN: [id number] Visit Date: 07/29/2023              Requested by: Gretel App, NP [address] [place],  [place] [address] PCP: Gretel App, NP  Chief Complaint  Patient presents with   Left Foot - Pain      HPI: Patient is a 73 year old woman who is seen for initial evaluation for fixed clawing of the 2nd and 3rd toes left foot.  Patient states she saw Dr. Alona September last year and was surgery was discussed.  She is currently in Hoka's shoes with about a 1 inch lift of the right shoe secondary to shortening from osteoarthritis of the right hip status post total hip arthroplasty.  Assessment & Plan: Visit Diagnoses:  1. Claw toe, acquired, left     Plan: Discussed surgical intervention options.  After reviewing the options patient states she would like to proceed with amputation of the second toe and Weil osteotomy of the 2nd and 3rd metatarsal.  Risk and benefits were discussed including risk of the wound not healing need for additional surgery.  Patient states she understands wished to proceed at this time.  Follow-Up Instructions: No follow-ups on file.   Ortho Exam  Patient is alert, oriented, no adenopathy, well-dressed, normal affect, normal respiratory effort. Examination patient has a triphasic posterior tibial pulse and a biphasic dorsalis pedis pulse by Doppler.  She has fixed clawing of the 2nd and 3rd toes with the second toe on top of the great toe.  Patient has pain to palpation beneath the 2nd and 3rd metatarsal heads.  Review of the radiographs shows a long 2nd and 3rd metatarsal.    Imaging: No results found. No images are attached to the encounter.  Labs: Lab Results  Component Value Date   HGBA1C 6.1 06/05/2023   HGBA1C 5.5 10/05/2022   HGBA1C 5.6 07/07/2021   ESRSEDRATE 12 10/23/2021   CRP 0.9 10/23/2021   REPTSTATUS 10/09/2021 FINAL  10/06/2021   CULT >=100,000 COLONIES/mL ESCHERICHIA COLI (A) 10/06/2021   LABORGA ESCHERICHIA COLI (A) 10/06/2021     Lab Results  Component Value Date   ALBUMIN  3.9 06/05/2023   ALBUMIN  3.8 05/03/2022   ALBUMIN  3.5 02/01/2022    No results found for: MG No results found for: VD25OH  No results found for: PREALBUMIN    Latest Ref Rng & Units 06/05/2023    3:37 PM 05/17/2022    1:26 AM 05/16/2022    1:46 AM  CBC EXTENDED  WBC 4.0 - 10.5 K/uL 6.9  8.9  6.1   RBC 3.87 - 5.11 Mil/uL 4.46  3.11  3.17   Hemoglobin 12.0 - 15.0 g/dL 86.7  9.4  9.7   HCT 63.9 - 46.0 % 40.5  27.8  28.5   Platelets 150.0 - 400.0 K/uL 232.0  105  118   NEUT# 1.4 - 7.7 K/uL 3.9     Lymph# 0.7 - 4.0 K/uL 1.9        There is no height or weight on file to calculate BMI.  Orders:  Orders Placed This Encounter  Procedures   Large Joint Inj   No orders of the defined types were placed in this encounter.    Procedures: No procedures performed  Clinical Data: No additional findings.  ROS:  All other systems negative, except as noted in the HPI. Review of Systems  Objective: Vital Signs: There were no vitals taken for this visit.  Specialty Comments:  No specialty comments available.  PMFS History: Patient Active Problem List   Diagnosis Date Noted   Protracted URI 05/03/2023   Acute non-recurrent pansinusitis 02/04/2023   OSA (obstructive sleep apnea) 11/14/2022   Arthritis 11/14/2022   Nocturia 10/05/2022   Hypersomnia 10/05/2022   S/P total hip arthroplasty 05/17/2022   Status post total replacement of right hip 05/15/2022   Overactive bladder 04/04/2022   Postmenopausal estrogen deficiency 11/13/2021   Primary osteoarthritis 11/13/2021   Leg length inequality 11/06/2021   Impaired ambulation 10/23/2021   Contact dermatitis 10/16/2021   SI joint arthritis (HCC) 08/24/2021   Spinal stenosis, lumbar region, without neurogenic claudication 08/24/2021   Degeneration of  lumbar intervertebral disc 07/24/2021   S/P total knee arthroplasty, left 07/18/2021   Preop examination 07/07/2021   Right hip pain 07/07/2021   Low back pain 07/07/2021   Drug-induced immunodeficiency (HCC) 06/12/2021   Impingement syndrome of right shoulder region 06/12/2021   COVID-19 01/24/2021   Heart murmur 11/24/2020   Hammer toe 04/22/2020   Metatarsalgia of left foot 04/22/2020   Osteoarthritis of midfoot 04/22/2020   Duodenal ulcer 11/12/2019   Stress 11/12/2019   Morbid obesity (HCC) 11/06/2019   Pain in left knee 07/30/2019   Aortic stenosis, moderate 02/06/2018   Chronic diarrhea 10/09/2017   Acid reflux 07/15/2017   Asymptomatic carotid artery stenosis 07/15/2017   Cyst of skin 07/15/2017   Right maxillary sinusitis 11/09/2016   Immunocompromised (HCC) 11/09/2016   Allergic rhinitis 11/02/2016   Benign essential tremor 11/02/2016   History of hypertension 11/02/2016   Postmenopausal symptoms 07/01/2015   Fatty liver 09/08/2007   Rheumatoid arthritis (HCC) 09/08/2007   Past Medical History:  Diagnosis Date   Allergy    Arthritis    Coronary artery disease    Fatty liver    GERD (gastroesophageal reflux disease)    Heart murmur    Hypertension    Hx   LIVER FUNCTION TESTS, ABNORMAL, HX OF 09/08/2007   Qualifier: Diagnosis of  By: Nelson-Smith CMA (AAMA), Dottie     Neuromuscular disorder (HCC)    Essential Tremor   Overactive bladder     Family History  Problem Relation Age of Onset   Alcohol abuse Mother    Arthritis Mother    Hyperlipidemia Mother    Heart disease Mother    Stroke Mother    Hypertension Mother    Depression Mother    Anxiety disorder Mother    Arthritis Father    Lung cancer Paternal Grandfather    Kidney disease Paternal Grandfather     Past Surgical History:  Procedure Laterality Date   ABDOMINAL HYSTERECTOMY     COLONOSCOPY WITH PROPOFOL  N/A 09/04/2018   Procedure: COLONOSCOPY WITH PROPOFOL ;  Surgeon: Janalyn Keene NOVAK, MD;  Location: [facility];  Service: Endoscopy;  Laterality: N/A;   ESOPHAGOGASTRODUODENOSCOPY (EGD) WITH PROPOFOL  N/A 09/04/2018   Procedure: ESOPHAGOGASTRODUODENOSCOPY (EGD) WITH PROPOFOL ;  Surgeon: Janalyn Keene NOVAK, MD;  Location: [facility];  Service: Endoscopy;  Laterality: N/A;   LAPAROSCOPY     for endometriosis x2   MOUTH SURGERY  04/2023   tooth extraction, gum graft   TONSILLECTOMY     TOTAL HIP ARTHROPLASTY Right 05/15/2022   Procedure: RIGHT TOTAL HIP ARTHROPLASTY ANTERIOR APPROACH;  Surgeon: Vernetta Lonni GRADE, MD;  Location: [facility];  Service:  Orthopedics;  Laterality: Right;   TOTAL KNEE ARTHROPLASTY Left 07/18/2021   Procedure: TOTAL KNEE ARTHROPLASTY;  Surgeon: Ernie Cough, MD;  Location: [facility];  Service: Orthopedics;  Laterality: Left;   TURBINATE REDUCTION     Social History   Occupational History   Not on file  Tobacco Use   Smoking status: Never    Passive exposure: Never   Smokeless tobacco: Never  Vaping Use   Vaping status: Never Used  Substance and Sexual Activity   Alcohol use: Not Currently    Alcohol/week: 1.0 standard drink of alcohol    Types: 1 Glasses of wine per week   Drug use: No   Sexual activity: Not Currently

## 2023-07-29 NOTE — Therapy (Signed)
 OUTPATIENT PHYSICAL THERAPY SHOULDER TREATMENT   Patient Name: Wanda Lang MRN: [id number] DOB:07-22-1950, 73 y.o., female Today's Date: 07/29/2023  END OF SESSION:  PT End of Session - 07/29/23 1213     Visit Number 4    Number of Visits 12    Date for PT Re-Evaluation 08/21/23    Authorization Type human[employer] MCR    Authorization Time Period 07/10/2023-08/31/2023    Authorization - Visit Number 3    Authorization - Number of Visits 15    Progress Note Due on Visit 19    PT Start Time 1149    PT Stop Time 1230    PT Time Calculation (min) 41 min    Activity Tolerance Patient tolerated treatment well    Behavior During Therapy [facility] for tasks assessed/performed          PT End of Session - 07/10/2023    Visit Number 1   Number of Visits 12   Date for PT Re-Evaluation 08/21/2023   Authorization Type [employer] MCR   PT Start Time  1104   PT Stop Time 1157   PT Time Calculation (min) 53 min   Activity Tolerance Patient tolerated treatment well   Behavior During Therapy  WFL for tasks assessed/performed    Past Medical History:  Diagnosis Date   Allergy    Arthritis    Coronary artery disease    Fatty liver    GERD (gastroesophageal reflux disease)    Heart murmur    Hypertension    Hx   LIVER FUNCTION TESTS, ABNORMAL, HX OF 09/08/2007   Qualifier: Diagnosis of  By: Nelson-Smith CMA (AAMA), Dottie     Neuromuscular disorder (HCC)    Essential Tremor   Overactive bladder    Past Surgical History:  Procedure Laterality Date   ABDOMINAL HYSTERECTOMY     COLONOSCOPY WITH PROPOFOL  N/A 09/04/2018   Procedure: COLONOSCOPY WITH PROPOFOL ;  Surgeon: Janalyn Keene NOVAK, MD;  Location: [facility];  Service: Endoscopy;  Laterality: N/A;   ESOPHAGOGASTRODUODENOSCOPY (EGD) WITH PROPOFOL  N/A 09/04/2018   Procedure: ESOPHAGOGASTRODUODENOSCOPY (EGD) WITH PROPOFOL ;  Surgeon: Janalyn Keene NOVAK, MD;  Location: [facility];  Service: Endoscopy;  Laterality: N/A;    LAPAROSCOPY     for endometriosis x2   MOUTH SURGERY  04/2023   tooth extraction, gum graft   TONSILLECTOMY     TOTAL HIP ARTHROPLASTY Right 05/15/2022   Procedure: RIGHT TOTAL HIP ARTHROPLASTY ANTERIOR APPROACH;  Surgeon: Vernetta Lonni GRADE, MD;  Location: [facility];  Service: Orthopedics;  Laterality: Right;   TOTAL KNEE ARTHROPLASTY Left 07/18/2021   Procedure: TOTAL KNEE ARTHROPLASTY;  Surgeon: Ernie Cough, MD;  Location: [facility];  Service: Orthopedics;  Laterality: Left;   TURBINATE REDUCTION     Patient Active Problem List   Diagnosis Date Noted   Protracted URI 05/03/2023   Acute non-recurrent pansinusitis 02/04/2023   OSA (obstructive sleep apnea) 11/14/2022   Arthritis 11/14/2022   Nocturia 10/05/2022   Hypersomnia 10/05/2022   S/P total hip arthroplasty 05/17/2022   Status post total replacement of right hip 05/15/2022   Overactive bladder 04/04/2022   Postmenopausal estrogen deficiency 11/13/2021   Primary osteoarthritis 11/13/2021   Leg length inequality 11/06/2021   Impaired ambulation 10/23/2021   Contact dermatitis 10/16/2021   SI joint arthritis (HCC) 08/24/2021   Spinal stenosis, lumbar region, without neurogenic claudication 08/24/2021   Degeneration of lumbar intervertebral disc 07/24/2021   S/P total knee arthroplasty, left 07/18/2021   Preop examination 07/07/2021   Right  hip pain 07/07/2021   Low back pain 07/07/2021   Drug-induced immunodeficiency (HCC) 06/12/2021   Impingement syndrome of right shoulder region 06/12/2021   COVID-19 01/24/2021   Heart murmur 11/24/2020   Hammer toe 04/22/2020   Metatarsalgia of left foot 04/22/2020   Osteoarthritis of midfoot 04/22/2020   Duodenal ulcer 11/12/2019   Stress 11/12/2019   Morbid obesity (HCC) 11/06/2019   Pain in left knee 07/30/2019   Aortic stenosis, moderate 02/06/2018   Chronic diarrhea 10/09/2017   Acid reflux 07/15/2017   Asymptomatic carotid artery stenosis 07/15/2017   Cyst of skin  07/15/2017   Right maxillary sinusitis 11/09/2016   Immunocompromised (HCC) 11/09/2016   Allergic rhinitis 11/02/2016   Benign essential tremor 11/02/2016   History of hypertension 11/02/2016   Postmenopausal symptoms 07/01/2015   Fatty liver 09/08/2007   Rheumatoid arthritis (HCC) 09/08/2007     REFERRING PROVIDER: Carilyn Prentice BRAVO, MD  REFERRING DIAG: [phone] (ICD-10-CM) - Chronic right shoulder pain  THERAPY DIAG:  Right shoulder pain, unspecified chronicity  Muscle weakness (generalized)  Stiffness of right shoulder, not elsewhere classified  Rationale for Evaluation and Treatment: Rehabilitation  ONSET DATE: Late 2024/January 2025  SUBJECTIVE:                                                                                                                                                                                      SUBJECTIVE STATEMENT:  Pt reports she saw foot MD this morning and is going to have foot surgery scheduled.   Eval: Pt has had some shoulder pain in the past which goes away with rest.  She began having pain late last year or January of this year, but the pain is not going away.  She denies any specific MOI.  Pt states she has been told she has arthritis and bursitis in the past.  Pt reports having 4 injections over time and reports no significant relief from any of the injections.  Pt saw Dr. Carilyn on 4/24.  He gave her an injection and ordered an x ray and PT.  MD note on 4/24 indicated exam consistent with bicipital tendinitis or tendinopathy as well as adhesive capsulitis as well as impingement syndrome.   Pt has been using her L hand a lot with activity due to R shoulder pain.  Pt is very limited with reaching and UE elevation.  Pt has pain and difficulty with self care activities including bathing, dressing, and washing hair.  Pt has much difficulty with reaching behind back.  Pt unable to reach overhead.  Pt is very limited with household  chores.     Hand dominance: Right  PERTINENT HISTORY:  Chronic LBP, multilevel DDD, SI pain R THA 04/24 L TKA 06/23 RA Right Knee End-stage OA  Bladder incontinence    PAIN:  Location:  deep in shoulder, lateral and post shoulder NPRS:  1-2/10 current, 8/10 worst, 0/10 best   PRECAUTIONS: RA, R THA, L TKA  RED FLAGS: Bowel or bladder incontinence: Yes:     WEIGHT BEARING RESTRICTIONS: No  FALLS:  Has patient fallen in last 6 months? No  LIVING ENVIRONMENT: Lives with: lives alone Lives in: 2 story home though mainly lives on the 1st floor Stairs: yes Has following equipment at home: chair lift on stairs, walking stick, FWW  OCCUPATION: retired  PLOF: Independent  PATIENT GOALS:to get rid of pain in shoulder and use her R arm.  NEXT MD VISIT:   OBJECTIVE:  Note: Objective measures were completed at Evaluation unless otherwise noted.  DIAGNOSTIC FINDINGS:  R shoulder x ray:   FINDINGS: There is diffuse decreased bone mineralization. Severe glenohumeral joint space narrowing with bone-on-bone contact, subchondral sclerosis, subchondral cystic change, and moderate superomedial humeral head and glenoid fossa cortical flattening/remodeling.   Mild acromioclavicular joint space narrowing and peripheral osteophytosis.   No acute fracture or dislocation.   The visualized portion of the right lung is unremarkable.   IMPRESSION: 1. Severe glenohumeral osteoarthritis. 2. Mild acromioclavicular osteoarthritis.   PATIENT SURVEYS:  UEFI:  20/80  COGNITION: Overall cognitive status: Within functional limits for tasks assessed      UPPER EXTREMITY ROM:   AROM/PROM Right eval Left eval  Shoulder flexion 62 with pain 152  Shoulder scaption 52 with pain 155  Shoulder abduction 38 with pain 138  Shoulder adduction    Shoulder internal rotation 47/44   Shoulder external rotation 24/22    Elbow flexion    Elbow extension    Wrist flexion    Wrist  extension    Wrist ulnar deviation    Wrist radial deviation    Wrist pronation    Wrist supination    (Blank rows = not tested)  UPPER EXTREMITY MMT:  MMT Right eval Left eval  Shoulder flexion  5/5  Shoulder extension    Shoulder abduction  4+/5  Shoulder adduction    Shoulder internal rotation Tolerated min to mod resistance with pain WFL in sitting  Shoulder external rotation Tolerated min resistance with pain Tolerated mod resistance in sitting  Middle trapezius    Lower trapezius    Elbow flexion    Elbow extension    Wrist flexion    Wrist extension    Wrist ulnar deviation    Wrist radial deviation    Wrist pronation    Wrist supination    Grip strength (lbs)    (Blank rows = not tested)  SHOULDER SPECIAL TESTS: ER lag:  negative with arm at side  TREATMENT:   07/29/23 PROM R shoulder  STM to UT, bicep, pec GHJ distraction Supine well arm assisted flexion x10 Seated scap retraction 3 2x10 Isometrics-flexion/abduction/extension- x 10 5second holds Standing ball rolls at table- flexion x10 (green physioball)  07/25/23 PROM R shoulder  STM to UT, bicep, pec GHJ distraction Supine AAROM flexion to 90 1 x 10 Isometrics-flexion/abduction/extension- x 10 second holds  6/12 PROM R shoulder  STM to UT, bicep, pec GHJ distraction Supine chest press with cane (attempted, but too painful) Seated scapular retraction 5 2x10 Seated posterior shoulder rolls x15 Isometrics-flexion/abduction/IR/ER/extension- 5 hold x10ea     Eval: Pt performed seated tables slides in flexion x 10 reps and seated scap retraction.  PT instructed pt to not perform into a painful range.  Pt received a HEP handout and was educated in correct form and appropriate frequency.   PATIENT EDUCATION: Education details: dx, HEP, POC, exercise form, rationale of  interventions, objective findings, and rationale of interventions.   Person educated: Patient Education method: Explanation, Demonstration, Tactile cues, Verbal cues, and Handouts Education comprehension: verbalized understanding, returned demonstration, verbal cues required, tactile cues required, and needs further education  HOME EXERCISE PROGRAM: Access Code: [email or web address] URL: [email or web address] Date: 07/11/2023 Prepared by: Mose Minerva  Exercises - Seated Scapular Retraction  - 2 x daily - 7 x weekly - 2 sets - 10 reps - 3 second hold - Seated Shoulder Flexion Towel Slide at Table Top  - 2 x daily - 7 x weekly - 2 sets - 10 reps  ASSESSMENT:  CLINICAL IMPRESSION:  Pt continues to improve with ability for AAROM shoulder flexion. Able to complete using well arm assist today without increase in pain. Pt remains tender into R UT so continued with STM to this area to decrease this. Pt did experience an increase in pain with passive IR and ER. Performed standing ball rolls with cues to avoid pushing past pain limitations. Will continue to progress as tolerated. Awaiting date for foot surgery.  Eval: Patient is a 73 y.o. female with a dx of chronic R shoulder pain.  Pt has had multiple injections without significant relief.  Pt had x rays which showed severe glenohumeral osteoarthritis.  Pt compensates using her L UE with activity due to R shoulder pain.  Pt has pain and difficulty with self care activities and ADLs/IADLs.  She is very limited with reaching and UE elevation.  Pt is unable to reach overhead and is very limited with household chores.  Pt has significant limitations in shoulder ROM and muscle weakness in R UE.  Pt should benefit from skilled PT to address impairments and improve overall function.     OBJECTIVE IMPAIRMENTS: decreased activity tolerance, decreased ROM, decreased strength, hypomobility, impaired flexibility, impaired UE functional use, and pain.    ACTIVITY LIMITATIONS: bathing, dressing, reach over head, and hygiene/grooming  PARTICIPATION LIMITATIONS: meal prep, cleaning, and laundry  PERSONAL FACTORS: 1-2 comorbidities: RA and chronic back pain are also affecting patient's functional outcome.   REHAB POTENTIAL: Good  CLINICAL DECISION MAKING: Stable/uncomplicated  EVALUATION COMPLEXITY: Low   GOALS:  SHORT TERM GOALS: Target date:  07/31/23   Pt will be independent and compliant with HEP for improved pain, ROM, strength, and function.  Baseline: Goal status: INITIAL  2.  Pt will demo at least a 30 deg increase in flexion, scaption, and abd AROM for improved shoulder mobility and stiffness.  Baseline:  Goal status: INITIAL  3.  Pt will report at least a 25%  improvement in performance of ADL's/self care activities.  Baseline:  Goal status: INITIAL  4.  Pt will be able to wash and dry her hair without significant difficulty.  Baseline:  Goal status: INITIAL Target date:  08/07/2023    LONG TERM GOALS: Target date: 08/21/23  Pt will be able to reach overhead into a cabinet without significant pain and difficulty.  Baseline:  Goal status: INITIAL  2.  Pt will be able to perform her ADLs and IADLs without significant difficulty and pain. Baseline:  Goal status: INITIAL  3.  Pt will demo improved R shoulder AROM to at least 130 deg in flexion and 50 deg in ER for improved reaching and performance of ADLs.  Baseline:  Goal status: INITIAL  4.  Pt will tolerate strengthening and stability exercises without adverse effects to improve R glenohumeral and scapular strength and stability to perform reaching activities, household chores, and functional lifting activities.   Baseline:  Goal status: INITIAL    PLAN:  PT FREQUENCY: 2x/week  PT DURATION: 6 weeks  PLANNED INTERVENTIONS: 97164- PT Re-evaluation, 97750- Physical Performance Testing, 97110-Therapeutic exercises, 97530- Therapeutic activity, [id number]-  Neuromuscular re-education, 97535- Self Care, 02859- Manual therapy, [id number]- Electrical stimulation (unattended), [id number]- Electrical stimulation (manual), [id number]- Ultrasound, Patient/Family education, Taping, Cryotherapy, and Moist heat  PLAN FOR NEXT SESSION: Cont with shoulder ROM, strengthening, and stability per pt tolerance.  Review and perform HEP.   Referring diagnosis? M25.511,G89.29 (ICD-10-CM) - Chronic right shoulder pain  Treatment diagnosis? (if different than referring diagnosis)  Right shoulder pain, unspecified chronicity  Stiffness of right shoulder, not elsewhere classified  Muscle weakness (generalized)  What was this (referring dx) caused by? []  Surgery []  Fall []  Ongoing issue []  Arthritis []  Other: Pt has no specific MOI, but does have arthritis  Laterality: [x]  Rt []  Lt []  Both  Check all possible CPT codes:  *CHOOSE 10 OR LESS*    See Planned Interventions listed in the Plan section of the Evaluation.     [place]berry BRAVO Maryln Eastham, PTA 07/29/2023, 5:12 PM

## 2023-07-30 ENCOUNTER — Encounter: Payer: Self-pay | Admitting: Nurse Practitioner

## 2023-07-30 DIAGNOSIS — R21 Rash and other nonspecific skin eruption: Secondary | ICD-10-CM | POA: Diagnosis not present

## 2023-07-30 DIAGNOSIS — M25511 Pain in right shoulder: Secondary | ICD-10-CM | POA: Diagnosis not present

## 2023-07-30 DIAGNOSIS — M79672 Pain in left foot: Secondary | ICD-10-CM | POA: Diagnosis not present

## 2023-07-30 DIAGNOSIS — M545 Low back pain, unspecified: Secondary | ICD-10-CM | POA: Diagnosis not present

## 2023-07-31 ENCOUNTER — Encounter (HOSPITAL_BASED_OUTPATIENT_CLINIC_OR_DEPARTMENT_OTHER): Payer: Self-pay | Admitting: Physical Therapy

## 2023-07-31 ENCOUNTER — Ambulatory Visit (HOSPITAL_BASED_OUTPATIENT_CLINIC_OR_DEPARTMENT_OTHER): Admitting: Physical Therapy

## 2023-07-31 DIAGNOSIS — M6281 Muscle weakness (generalized): Secondary | ICD-10-CM | POA: Diagnosis not present

## 2023-07-31 DIAGNOSIS — G8929 Other chronic pain: Secondary | ICD-10-CM | POA: Diagnosis not present

## 2023-07-31 DIAGNOSIS — M25611 Stiffness of right shoulder, not elsewhere classified: Secondary | ICD-10-CM | POA: Diagnosis not present

## 2023-07-31 DIAGNOSIS — M25511 Pain in right shoulder: Secondary | ICD-10-CM | POA: Diagnosis not present

## 2023-07-31 NOTE — Therapy (Signed)
 OUTPATIENT PHYSICAL THERAPY SHOULDER TREATMENT   Patient Name: Wanda Lang MRN: [id number] DOB:01-11-1951, 73 y.o., female Today's Date: 07/31/2023  END OF SESSION:  PT End of Session - 07/31/23 0933     Visit Number 5    Number of Visits 12    Date for PT Re-Evaluation 08/21/23    Authorization Type human[employer] MCR    Authorization Time Period 07/10/2023-08/31/2023    Authorization - Visit Number 5    Authorization - Number of Visits 15    Progress Note Due on Visit 19    PT Start Time 0933    PT Stop Time 1012    PT Time Calculation (min) 39 min    Activity Tolerance Patient tolerated treatment well    Behavior During Therapy WFL for tasks assessed/performed            Past Medical History:  Diagnosis Date   Allergy    Arthritis    Coronary artery disease    Fatty liver    GERD (gastroesophageal reflux disease)    Heart murmur    Hypertension    Hx   LIVER FUNCTION TESTS, ABNORMAL, HX OF 09/08/2007   Qualifier: Diagnosis of  By: Nelson-Smith CMA (AAMA), Dottie     Neuromuscular disorder (HCC)    Essential Tremor   Overactive bladder    Past Surgical History:  Procedure Laterality Date   ABDOMINAL HYSTERECTOMY     COLONOSCOPY WITH PROPOFOL  N/A 09/04/2018   Procedure: COLONOSCOPY WITH PROPOFOL ;  Surgeon: Janalyn Keene NOVAK, MD;  Location: [facility];  Service: Endoscopy;  Laterality: N/A;   ESOPHAGOGASTRODUODENOSCOPY (EGD) WITH PROPOFOL  N/A 09/04/2018   Procedure: ESOPHAGOGASTRODUODENOSCOPY (EGD) WITH PROPOFOL ;  Surgeon: Janalyn Keene NOVAK, MD;  Location: [facility];  Service: Endoscopy;  Laterality: N/A;   LAPAROSCOPY     for endometriosis x2   MOUTH SURGERY  04/2023   tooth extraction, gum graft   TONSILLECTOMY     TOTAL HIP ARTHROPLASTY Right 05/15/2022   Procedure: RIGHT TOTAL HIP ARTHROPLASTY ANTERIOR APPROACH;  Surgeon: Vernetta Lonni GRADE, MD;  Location: [facility];  Service: Orthopedics;  Laterality: Right;   TOTAL KNEE ARTHROPLASTY Left  07/18/2021   Procedure: TOTAL KNEE ARTHROPLASTY;  Surgeon: Ernie Cough, MD;  Location: [facility];  Service: Orthopedics;  Laterality: Left;   TURBINATE REDUCTION     Patient Active Problem List   Diagnosis Date Noted   Protracted URI 05/03/2023   Acute non-recurrent pansinusitis 02/04/2023   OSA (obstructive sleep apnea) 11/14/2022   Arthritis 11/14/2022   Nocturia 10/05/2022   Hypersomnia 10/05/2022   S/P total hip arthroplasty 05/17/2022   Status post total replacement of right hip 05/15/2022   Overactive bladder 04/04/2022   Postmenopausal estrogen deficiency 11/13/2021   Primary osteoarthritis 11/13/2021   Leg length inequality 11/06/2021   Impaired ambulation 10/23/2021   Contact dermatitis 10/16/2021   SI joint arthritis (HCC) 08/24/2021   Spinal stenosis, lumbar region, without neurogenic claudication 08/24/2021   Degeneration of lumbar intervertebral disc 07/24/2021   S/P total knee arthroplasty, left 07/18/2021   Preop examination 07/07/2021   Right hip pain 07/07/2021   Low back pain 07/07/2021   Drug-induced immunodeficiency (HCC) 06/12/2021   Impingement syndrome of right shoulder region 06/12/2021   COVID-19 01/24/2021   Heart murmur 11/24/2020   Hammer toe 04/22/2020   Metatarsalgia of left foot 04/22/2020   Osteoarthritis of midfoot 04/22/2020   Duodenal ulcer 11/12/2019   Stress 11/12/2019   Morbid obesity (HCC) 11/06/2019   Pain in left  knee 07/30/2019   Aortic stenosis, moderate 02/06/2018   Chronic diarrhea 10/09/2017   Acid reflux 07/15/2017   Asymptomatic carotid artery stenosis 07/15/2017   Cyst of skin 07/15/2017   Right maxillary sinusitis 11/09/2016   Immunocompromised (HCC) 11/09/2016   Allergic rhinitis 11/02/2016   Benign essential tremor 11/02/2016   History of hypertension 11/02/2016   Postmenopausal symptoms 07/01/2015   Fatty liver 09/08/2007   Rheumatoid arthritis (HCC) 09/08/2007     REFERRING PROVIDER: Carilyn Prentice BRAVO,  MD  REFERRING DIAG: [phone] (ICD-10-CM) - Chronic right shoulder pain  THERAPY DIAG:  Right shoulder pain, unspecified chronicity  Muscle weakness (generalized)  Stiffness of right shoulder, not elsewhere classified  Rationale for Evaluation and Treatment: Rehabilitation  ONSET DATE: Late 2024/January 2025  SUBJECTIVE:                                                                                                                                                                                      SUBJECTIVE STATEMENT:  Pt reports foot hurts today and bothers her.  Shoulder just keeps getting better. Catches her self reaching for things and it irritates shoulder. On a course of prednisone .   Eval: Pt has had some shoulder pain in the past which goes away with rest.  She began having pain late last year or January of this year, but the pain is not going away.  She denies any specific MOI.  Pt states she has been told she has arthritis and bursitis in the past.  Pt reports having 4 injections over time and reports no significant relief from any of the injections.  Pt saw Dr. Carilyn on 4/24.  He gave her an injection and ordered an x ray and PT.  MD note on 4/24 indicated exam consistent with bicipital tendinitis or tendinopathy as well as adhesive capsulitis as well as impingement syndrome.   Pt has been using her L hand a lot with activity due to R shoulder pain.  Pt is very limited with reaching and UE elevation.  Pt has pain and difficulty with self care activities including bathing, dressing, and washing hair.  Pt has much difficulty with reaching behind back.  Pt unable to reach overhead.  Pt is very limited with household chores.     Hand dominance: Right  PERTINENT HISTORY: Chronic LBP, multilevel DDD, SI pain R THA 04/24 L TKA 06/23 RA Right Knee End-stage OA  Bladder incontinence    PAIN:  Location:  deep in shoulder, lateral and post shoulder NPRS:  1-2/10  current, 8/10 worst, 0/10 best   PRECAUTIONS: RA, R THA, L TKA  RED FLAGS: Bowel or bladder incontinence: Yes:  WEIGHT BEARING RESTRICTIONS: No  FALLS:  Has patient fallen in last 6 months? No  LIVING ENVIRONMENT: Lives with: lives alone Lives in: 2 story home though mainly lives on the 1st floor Stairs: yes Has following equipment at home: chair lift on stairs, walking stick, FWW  OCCUPATION: retired  PLOF: Independent  PATIENT GOALS:to get rid of pain in shoulder and use her R arm.  NEXT MD VISIT:   OBJECTIVE:  Note: Objective measures were completed at Evaluation unless otherwise noted.  DIAGNOSTIC FINDINGS:  R shoulder x ray:   FINDINGS: There is diffuse decreased bone mineralization. Severe glenohumeral joint space narrowing with bone-on-bone contact, subchondral sclerosis, subchondral cystic change, and moderate superomedial humeral head and glenoid fossa cortical flattening/remodeling.   Mild acromioclavicular joint space narrowing and peripheral osteophytosis.   No acute fracture or dislocation.   The visualized portion of the right lung is unremarkable.   IMPRESSION: 1. Severe glenohumeral osteoarthritis. 2. Mild acromioclavicular osteoarthritis.   PATIENT SURVEYS:  UEFI:  20/80  COGNITION: Overall cognitive status: Within functional limits for tasks assessed      UPPER EXTREMITY ROM:   AROM/PROM Right eval Left eval  Shoulder flexion 62 with pain 152  Shoulder scaption 52 with pain 155  Shoulder abduction 38 with pain 138  Shoulder adduction    Shoulder internal rotation 47/44   Shoulder external rotation 24/22    Elbow flexion    Elbow extension    Wrist flexion    Wrist extension    Wrist ulnar deviation    Wrist radial deviation    Wrist pronation    Wrist supination    (Blank rows = not tested)  UPPER EXTREMITY MMT:  MMT Right eval Left eval  Shoulder flexion  5/5  Shoulder extension    Shoulder abduction  4+/5   Shoulder adduction    Shoulder internal rotation Tolerated min to mod resistance with pain WFL in sitting  Shoulder external rotation Tolerated min resistance with pain Tolerated mod resistance in sitting  Middle trapezius    Lower trapezius    Elbow flexion    Elbow extension    Wrist flexion    Wrist extension    Wrist ulnar deviation    Wrist radial deviation    Wrist pronation    Wrist supination    Grip strength (lbs)    (Blank rows = not tested)  SHOULDER SPECIAL TESTS: ER lag:  negative with arm at side                                                                                                                             TREATMENT:  07/31/23 PROM R shoulder  STM to UT, bicep, pec, deltoid GHJ distraction Supine AAROM flexion to 90 1 x 10 Supine well arm assisted flexion 1 x10 Standing ball rolls at table- flexion 2 x10 (green physioball) Standing row RTB 2 x 10 Standing shoulder extension RTB 2 x 10  Shoulder  isometrics IR/ER 5 x 10 second holds  07/29/23 PROM R shoulder  STM to UT, bicep, pec GHJ distraction Supine well arm assisted flexion x10 Seated scap retraction 3 2x10 Isometrics-flexion/abduction/extension- x 10 5second holds Standing ball rolls at table- flexion x10 (green physioball)  07/25/23 PROM R shoulder  STM to UT, bicep, pec GHJ distraction Supine AAROM flexion to 90 1 x 10 Isometrics-flexion/abduction/extension- x 10 second holds  6/12 PROM R shoulder  STM to UT, bicep, pec GHJ distraction Supine chest press with cane (attempted, but too painful) Seated scapular retraction 5 2x10 Seated posterior shoulder rolls x15 Isometrics-flexion/abduction/IR/ER/extension- 5 hold x10ea     Eval: Pt performed seated tables slides in flexion x 10 reps and seated scap retraction.  PT instructed pt to not perform into a painful range.  Pt received a HEP handout and was educated in correct form and appropriate frequency.   PATIENT  EDUCATION: Education details: dx, HEP, POC, exercise form, rationale of interventions, objective findings, and rationale of interventions.   Person educated: Patient Education method: Explanation, Demonstration, Tactile cues, Verbal cues, and Handouts Education comprehension: verbalized understanding, returned demonstration, verbal cues required, tactile cues required, and needs further education  HOME EXERCISE PROGRAM: Access Code: [email or web address] URL: [email or web address] Date: 07/11/2023 Prepared by: Mose Minerva  Exercises - Seated Scapular Retraction  - 2 x daily - 7 x weekly - 2 sets - 10 reps - 3 second hold - Seated Shoulder Flexion Towel Slide at Table Top  - 2 x daily - 7 x weekly - 2 sets - 10 reps  ASSESSMENT:  CLINICAL IMPRESSION:  Continued with manual for mobility but patient remains limited to about 90 flexion. Continued with AAROM exercises. Began resisted postural strengthening which is completed with good mechanics. Patient will continue to benefit from physical therapy in order to improve function and reduce impairment.   Eval: Patient is a 73 y.o. female with a dx of chronic R shoulder pain.  Pt has had multiple injections without significant relief.  Pt had x rays which showed severe glenohumeral osteoarthritis.  Pt compensates using her L UE with activity due to R shoulder pain.  Pt has pain and difficulty with self care activities and ADLs/IADLs.  She is very limited with reaching and UE elevation.  Pt is unable to reach overhead and is very limited with household chores.  Pt has significant limitations in shoulder ROM and muscle weakness in R UE.  Pt should benefit from skilled PT to address impairments and improve overall function.     OBJECTIVE IMPAIRMENTS: decreased activity tolerance, decreased ROM, decreased strength, hypomobility, impaired flexibility, impaired UE functional use, and pain.   ACTIVITY LIMITATIONS: bathing, dressing, reach over head,  and hygiene/grooming  PARTICIPATION LIMITATIONS: meal prep, cleaning, and laundry  PERSONAL FACTORS: 1-2 comorbidities: RA and chronic back pain are also affecting patient's functional outcome.   REHAB POTENTIAL: Good  CLINICAL DECISION MAKING: Stable/uncomplicated  EVALUATION COMPLEXITY: Low   GOALS:  SHORT TERM GOALS: Target date:  07/31/23   Pt will be independent and compliant with HEP for improved pain, ROM, strength, and function.  Baseline: Goal status: INITIAL  2.  Pt will demo at least a 30 deg increase in flexion, scaption, and abd AROM for improved shoulder mobility and stiffness.  Baseline:  Goal status: INITIAL  3.  Pt will report at least a 25% improvement in performance of ADL's/self care activities.  Baseline:  Goal status: INITIAL  4.  Pt will be able to wash and dry  her hair without significant difficulty.  Baseline:  Goal status: INITIAL Target date:  08/07/2023    LONG TERM GOALS: Target date: 08/21/23  Pt will be able to reach overhead into a cabinet without significant pain and difficulty.  Baseline:  Goal status: INITIAL  2.  Pt will be able to perform her ADLs and IADLs without significant difficulty and pain. Baseline:  Goal status: INITIAL  3.  Pt will demo improved R shoulder AROM to at least 130 deg in flexion and 50 deg in ER for improved reaching and performance of ADLs.  Baseline:  Goal status: INITIAL  4.  Pt will tolerate strengthening and stability exercises without adverse effects to improve R glenohumeral and scapular strength and stability to perform reaching activities, household chores, and functional lifting activities.   Baseline:  Goal status: INITIAL    PLAN:  PT FREQUENCY: 2x/week  PT DURATION: 6 weeks  PLANNED INTERVENTIONS: 97164- PT Re-evaluation, 97750- Physical Performance Testing, 97110-Therapeutic exercises, 97530- Therapeutic activity, [id number]- Neuromuscular re-education, 97535- Self Care, [id number]- Manual  therapy, [id number]- Electrical stimulation (unattended), [id number]- Electrical stimulation (manual), [id number]- Ultrasound, Patient/Family education, Taping, Cryotherapy, and Moist heat  PLAN FOR NEXT SESSION: Cont with shoulder ROM, strengthening, and stability per pt tolerance.  Review and perform HEP.   Referring diagnosis? M25.511,G89.29 (ICD-10-CM) - Chronic right shoulder pain  Treatment diagnosis? (if different than referring diagnosis)  Right shoulder pain, unspecified chronicity  Stiffness of right shoulder, not elsewhere classified  Muscle weakness (generalized)  What was this (referring dx) caused by? []  Surgery []  Fall []  Ongoing issue []  Arthritis []  Other: Pt has no specific MOI, but does have arthritis  Laterality: [x]  Rt []  Lt []  Both  Check all possible CPT codes:  *CHOOSE 10 OR LESS*    See Planned Interventions listed in the Plan section of the Evaluation.     Prentice RAMAN Kharter Brew, PT 07/31/2023, 9:33 AM

## 2023-07-31 NOTE — Telephone Encounter (Signed)
 Lvm for pt. Okay to relay and schedule lab appt

## 2023-08-01 ENCOUNTER — Other Ambulatory Visit (INDEPENDENT_AMBULATORY_CARE_PROVIDER_SITE_OTHER)

## 2023-08-01 ENCOUNTER — Ambulatory Visit: Payer: Self-pay | Admitting: Nurse Practitioner

## 2023-08-01 ENCOUNTER — Other Ambulatory Visit: Payer: Self-pay | Admitting: *Deleted

## 2023-08-01 ENCOUNTER — Other Ambulatory Visit: Payer: Self-pay | Admitting: Nurse Practitioner

## 2023-08-01 DIAGNOSIS — M0589 Other rheumatoid arthritis with rheumatoid factor of multiple sites: Secondary | ICD-10-CM | POA: Diagnosis not present

## 2023-08-01 DIAGNOSIS — M150 Primary generalized (osteo)arthritis: Secondary | ICD-10-CM | POA: Diagnosis not present

## 2023-08-01 DIAGNOSIS — R748 Abnormal levels of other serum enzymes: Secondary | ICD-10-CM

## 2023-08-01 DIAGNOSIS — M2140 Flat foot [pes planus] (acquired), unspecified foot: Secondary | ICD-10-CM | POA: Diagnosis not present

## 2023-08-01 DIAGNOSIS — M1710 Unilateral primary osteoarthritis, unspecified knee: Secondary | ICD-10-CM | POA: Diagnosis not present

## 2023-08-01 DIAGNOSIS — M549 Dorsalgia, unspecified: Secondary | ICD-10-CM | POA: Diagnosis not present

## 2023-08-01 DIAGNOSIS — M25511 Pain in right shoulder: Secondary | ICD-10-CM | POA: Diagnosis not present

## 2023-08-01 DIAGNOSIS — Z79899 Other long term (current) drug therapy: Secondary | ICD-10-CM | POA: Diagnosis not present

## 2023-08-01 LAB — HEPATIC FUNCTION PANEL
ALT: 160 U/L — ABNORMAL HIGH (ref 0–35)
AST: 111 U/L — ABNORMAL HIGH (ref 0–37)
Albumin: 3.9 g/dL (ref 3.5–5.2)
Alkaline Phosphatase: 445 U/L — ABNORMAL HIGH (ref 39–117)
Bilirubin, Direct: 0.3 mg/dL (ref 0.0–0.3)
Total Bilirubin: 0.8 mg/dL (ref 0.2–1.2)
Total Protein: 6.7 g/dL (ref 6.0–8.3)

## 2023-08-01 NOTE — Telephone Encounter (Signed)
 Lab appt made by [email or web address]

## 2023-08-02 ENCOUNTER — Other Ambulatory Visit

## 2023-08-03 DIAGNOSIS — G4733 Obstructive sleep apnea (adult) (pediatric): Secondary | ICD-10-CM | POA: Diagnosis not present

## 2023-08-05 ENCOUNTER — Ambulatory Visit (HOSPITAL_BASED_OUTPATIENT_CLINIC_OR_DEPARTMENT_OTHER): Payer: Self-pay | Admitting: Physical Therapy

## 2023-08-05 ENCOUNTER — Other Ambulatory Visit (INDEPENDENT_AMBULATORY_CARE_PROVIDER_SITE_OTHER)

## 2023-08-05 DIAGNOSIS — R748 Abnormal levels of other serum enzymes: Secondary | ICD-10-CM

## 2023-08-05 DIAGNOSIS — M6281 Muscle weakness (generalized): Secondary | ICD-10-CM | POA: Diagnosis not present

## 2023-08-05 DIAGNOSIS — M25611 Stiffness of right shoulder, not elsewhere classified: Secondary | ICD-10-CM

## 2023-08-05 DIAGNOSIS — M25511 Pain in right shoulder: Secondary | ICD-10-CM | POA: Diagnosis not present

## 2023-08-05 DIAGNOSIS — G8929 Other chronic pain: Secondary | ICD-10-CM | POA: Diagnosis not present

## 2023-08-05 NOTE — Therapy (Signed)
 OUTPATIENT PHYSICAL THERAPY SHOULDER TREATMENT   Patient Name: Wanda Lang MRN: [id number] DOB:1950-02-28, 73 y.o., female Today's Date: 08/06/2023  END OF SESSION:  PT End of Session - 08/05/23 1156     Visit Number 6    Number of Visits 12    Date for PT Re-Evaluation 08/21/23    Authorization Type human[employer] MCR    PT Start Time 1153    PT Stop Time 1232    PT Time Calculation (min) 39 min    Activity Tolerance Patient tolerated treatment well    Behavior During Therapy WFL for tasks assessed/performed            Past Medical History:  Diagnosis Date   Allergy    Arthritis    Coronary artery disease    Fatty liver    GERD (gastroesophageal reflux disease)    Heart murmur    Hypertension    Hx   LIVER FUNCTION TESTS, ABNORMAL, HX OF 09/08/2007   Qualifier: Diagnosis of  By: Nelson-Smith CMA (AAMA), Dottie     Neuromuscular disorder (HCC)    Essential Tremor   Overactive bladder    Past Surgical History:  Procedure Laterality Date   ABDOMINAL HYSTERECTOMY     COLONOSCOPY WITH PROPOFOL  N/A 09/04/2018   Procedure: COLONOSCOPY WITH PROPOFOL ;  Surgeon: Janalyn Keene NOVAK, MD;  Location: [facility];  Service: Endoscopy;  Laterality: N/A;   ESOPHAGOGASTRODUODENOSCOPY (EGD) WITH PROPOFOL  N/A 09/04/2018   Procedure: ESOPHAGOGASTRODUODENOSCOPY (EGD) WITH PROPOFOL ;  Surgeon: Janalyn Keene NOVAK, MD;  Location: [facility];  Service: Endoscopy;  Laterality: N/A;   LAPAROSCOPY     for endometriosis x2   MOUTH SURGERY  04/2023   tooth extraction, gum graft   TONSILLECTOMY     TOTAL HIP ARTHROPLASTY Right 05/15/2022   Procedure: RIGHT TOTAL HIP ARTHROPLASTY ANTERIOR APPROACH;  Surgeon: Vernetta Lonni GRADE, MD;  Location: [facility];  Service: Orthopedics;  Laterality: Right;   TOTAL KNEE ARTHROPLASTY Left 07/18/2021   Procedure: TOTAL KNEE ARTHROPLASTY;  Surgeon: Ernie Cough, MD;  Location: [facility];  Service: Orthopedics;  Laterality: Left;   TURBINATE REDUCTION      Patient Active Problem List   Diagnosis Date Noted   Protracted URI 05/03/2023   Acute non-recurrent pansinusitis 02/04/2023   OSA (obstructive sleep apnea) 11/14/2022   Arthritis 11/14/2022   Nocturia 10/05/2022   Hypersomnia 10/05/2022   S/P total hip arthroplasty 05/17/2022   Status post total replacement of right hip 05/15/2022   Overactive bladder 04/04/2022   Postmenopausal estrogen deficiency 11/13/2021   Primary osteoarthritis 11/13/2021   Leg length inequality 11/06/2021   Impaired ambulation 10/23/2021   Contact dermatitis 10/16/2021   SI joint arthritis (HCC) 08/24/2021   Spinal stenosis, lumbar region, without neurogenic claudication 08/24/2021   Degeneration of lumbar intervertebral disc 07/24/2021   S/P total knee arthroplasty, left 07/18/2021   Preop examination 07/07/2021   Right hip pain 07/07/2021   Low back pain 07/07/2021   Drug-induced immunodeficiency (HCC) 06/12/2021   Impingement syndrome of right shoulder region 06/12/2021   COVID-19 01/24/2021   Heart murmur 11/24/2020   Hammer toe 04/22/2020   Metatarsalgia of left foot 04/22/2020   Osteoarthritis of midfoot 04/22/2020   Duodenal ulcer 11/12/2019   Stress 11/12/2019   Morbid obesity (HCC) 11/06/2019   Pain in left knee 07/30/2019   Aortic stenosis, moderate 02/06/2018   Chronic diarrhea 10/09/2017   Acid reflux 07/15/2017   Asymptomatic carotid artery stenosis 07/15/2017   Cyst of skin 07/15/2017  Right maxillary sinusitis 11/09/2016   Immunocompromised (HCC) 11/09/2016   Allergic rhinitis 11/02/2016   Benign essential tremor 11/02/2016   History of hypertension 11/02/2016   Postmenopausal symptoms 07/01/2015   Fatty liver 09/08/2007   Rheumatoid arthritis (HCC) 09/08/2007     REFERRING PROVIDER: Carilyn Prentice BRAVO, MD  REFERRING DIAG: [phone] (ICD-10-CM) - Chronic right shoulder pain  THERAPY DIAG:  Right shoulder pain, unspecified chronicity  Stiffness of right  shoulder, not elsewhere classified  Muscle weakness (generalized)  Rationale for Evaluation and Treatment: Rehabilitation  ONSET DATE: Late 2024/January 2025  SUBJECTIVE:                                                                                                                                                                                      SUBJECTIVE STATEMENT:  Pt saw MD who thinks she may need a TSR and referred her to Dr. Genelle.  Pt reports improved shoulder ROM and more freedom of movement.  She still has pain and limitations.  Pt states she is limited with reaching back.  Pt is planning on having foot surgery.  Pt denies any adverse effects after prior Rx.  Pt reports compliance with HEP.        Hand dominance: Right  PERTINENT HISTORY: Chronic LBP, multilevel DDD, SI pain R THA 04/24 L TKA 06/23 RA Right Knee End-stage OA  Bladder incontinence    PAIN:  Location:  deep in shoulder, lateral and post shoulder NPRS:  2-3/10 current, 8/10 worst, 0/10 best   PRECAUTIONS: RA, R THA, L TKA  RED FLAGS: Bowel or bladder incontinence: Yes:     WEIGHT BEARING RESTRICTIONS: No  FALLS:  Has patient fallen in last 6 months? No  LIVING ENVIRONMENT: Lives with: lives alone Lives in: 2 story home though mainly lives on the 1st floor Stairs: yes Has following equipment at home: chair lift on stairs, walking stick, FWW  OCCUPATION: retired  PLOF: Independent  PATIENT GOALS:to get rid of pain in shoulder and use her R arm.  NEXT MD VISIT:   OBJECTIVE:  Note: Objective measures were completed at Evaluation unless otherwise noted.  DIAGNOSTIC FINDINGS:  R shoulder x ray:   FINDINGS: There is diffuse decreased bone mineralization. Severe glenohumeral joint space narrowing with bone-on-bone contact, subchondral sclerosis, subchondral cystic change, and moderate superomedial humeral head and glenoid fossa cortical flattening/remodeling.   Mild  acromioclavicular joint space narrowing and peripheral osteophytosis.   No acute fracture or dislocation.   The visualized portion of the right lung is unremarkable.   IMPRESSION: 1. Severe glenohumeral osteoarthritis. 2. Mild acromioclavicular osteoarthritis.   PATIENT SURVEYS:  UEFI:  20/80  COGNITION:  Overall cognitive status: Within functional limits for tasks assessed      UPPER EXTREMITY ROM:   AROM/PROM Right eval Left eval  Shoulder flexion 62 with pain 152  Shoulder scaption 52 with pain 155  Shoulder abduction 38 with pain 138  Shoulder adduction    Shoulder internal rotation 47/44   Shoulder external rotation 24/22    Elbow flexion    Elbow extension    Wrist flexion    Wrist extension    Wrist ulnar deviation    Wrist radial deviation    Wrist pronation    Wrist supination    (Blank rows = not tested)  UPPER EXTREMITY MMT:  MMT Right eval Left eval  Shoulder flexion  5/5  Shoulder extension    Shoulder abduction  4+/5  Shoulder adduction    Shoulder internal rotation Tolerated min to mod resistance with pain WFL in sitting  Shoulder external rotation Tolerated min resistance with pain Tolerated mod resistance in sitting  Middle trapezius    Lower trapezius    Elbow flexion    Elbow extension    Wrist flexion    Wrist extension    Wrist ulnar deviation    Wrist radial deviation    Wrist pronation    Wrist supination    Grip strength (lbs)    (Blank rows = not tested)  SHOULDER SPECIAL TESTS: ER lag:  negative with arm at side                                                                                                                             TREATMENT:  08/05/2023 Reviewed pt presentation, HEP compliance, pain level, and response to prior Rx.  Pt received R shoulder PROM in flexion, scaption, and ER per pt and tissue tolerance.   Supine well arm assisted flexion x 10 reps Supine wand flexion x 7 reps Supine serratus punch with  PT assist 3x10 reps Standing ball rolls at table- flexion 2 x10 (green physioball) ER/IR isometrics with 5 sec hold x 10 reps each Standing row RTB 2 x 10 Standing shoulder extension RTB 2 x 10     07/31/23 PROM R shoulder  STM to UT, bicep, pec, deltoid GHJ distraction Supine AAROM flexion to 90 1 x 10 Supine well arm assisted flexion 1 x10 Standing ball rolls at table- flexion 2 x10 (green physioball) Standing row RTB 2 x 10 Standing shoulder extension RTB 2 x 10  Shoulder isometrics IR/ER 5 x 10 second holds  07/29/23 PROM R shoulder  STM to UT, bicep, pec GHJ distraction Supine well arm assisted flexion x10 Seated scap retraction 3 2x10 Isometrics-flexion/abduction/extension- x 10 5second holds Standing ball rolls at table- flexion x10 (green physioball)  07/25/23 PROM R shoulder  STM to UT, bicep, pec GHJ distraction Supine AAROM flexion to 90 1 x 10 Isometrics-flexion/abduction/extension- x 10 second holds  6/12 PROM R shoulder  STM to UT, bicep, pec GHJ distraction Supine  chest press with cane (attempted, but too painful) Seated scapular retraction 5 2x10 Seated posterior shoulder rolls x15 Isometrics-flexion/abduction/IR/ER/extension- 5 hold x10ea     Eval: Pt performed seated tables slides in flexion x 10 reps and seated scap retraction.  PT instructed pt to not perform into a painful range.  Pt received a HEP handout and was educated in correct form and appropriate frequency.   PATIENT EDUCATION: Education details: dx, HEP, POC, exercise form, rationale of interventions, objective findings, and rationale of interventions.   Person educated: Patient Education method: Explanation, Demonstration, Tactile cues, Verbal cues, and Handouts Education comprehension: verbalized understanding, returned demonstration, verbal cues required, tactile cues required, and needs further education  HOME EXERCISE PROGRAM: Access Code: [email or web address] URL:  [email or web address] Date: 07/11/2023 Prepared by: Mose Minerva  Exercises - Seated Scapular Retraction  - 2 x daily - 7 x weekly - 2 sets - 10 reps - 3 second hold - Seated Shoulder Flexion Towel Slide at Table Top  - 2 x daily - 7 x weekly - 2 sets - 10 reps  ASSESSMENT:  CLINICAL IMPRESSION:  Pt continues to have pain and limitations, but reports improved shoulder ROM and mobility.  PT performed PROM w/n pt and tissue tolerance.  She is limited with PROM especially ER.  Pt tolerated supine well arm assisted flexion better than supine wand flexion.  She felt popping with supine wand flexion though had no popping with well arm assisted flexion.  PT provided manual assistance with supine serratus punch.  Pt tolerated exercises well and reports feeling better and more loose after Rx.  She reports no increased pain after Rx.  Pt should benefit from continued skilled PT to address impairments and improve overall function.     OBJECTIVE IMPAIRMENTS: decreased activity tolerance, decreased ROM, decreased strength, hypomobility, impaired flexibility, impaired UE functional use, and pain.   ACTIVITY LIMITATIONS: bathing, dressing, reach over head, and hygiene/grooming  PARTICIPATION LIMITATIONS: meal prep, cleaning, and laundry  PERSONAL FACTORS: 1-2 comorbidities: RA and chronic back pain are also affecting patient's functional outcome.   REHAB POTENTIAL: Good  CLINICAL DECISION MAKING: Stable/uncomplicated  EVALUATION COMPLEXITY: Low   GOALS:  SHORT TERM GOALS: Target date:  07/31/23   Pt will be independent and compliant with HEP for improved pain, ROM, strength, and function.  Baseline: Goal status: INITIAL  2.  Pt will demo at least a 30 deg increase in flexion, scaption, and abd AROM for improved shoulder mobility and stiffness.  Baseline:  Goal status: INITIAL  3.  Pt will report at least a 25% improvement in performance of ADL's/self care activities.   Baseline:  Goal status: INITIAL  4.  Pt will be able to wash and dry her hair without significant difficulty.  Baseline:  Goal status: INITIAL Target date:  08/07/2023    LONG TERM GOALS: Target date: 08/21/23  Pt will be able to reach overhead into a cabinet without significant pain and difficulty.  Baseline:  Goal status: INITIAL  2.  Pt will be able to perform her ADLs and IADLs without significant difficulty and pain. Baseline:  Goal status: INITIAL  3.  Pt will demo improved R shoulder AROM to at least 130 deg in flexion and 50 deg in ER for improved reaching and performance of ADLs.  Baseline:  Goal status: INITIAL  4.  Pt will tolerate strengthening and stability exercises without adverse effects to improve R glenohumeral and scapular strength and stability to perform reaching activities, household chores, and functional lifting  activities.   Baseline:  Goal status: INITIAL    PLAN:  PT FREQUENCY: 2x/week  PT DURATION: 6 weeks  PLANNED INTERVENTIONS: [id number]- PT Re-evaluation, 97750- Physical Performance Testing, 97110-Therapeutic exercises, 97530- Therapeutic activity, [id number]- Neuromuscular re-education, 97535- Self Care, [id number]- Manual therapy, [id number]- Electrical stimulation (unattended), [id number]- Electrical stimulation (manual), [id number]- Ultrasound, Patient/Family education, Taping, Cryotherapy, and Moist heat  PLAN FOR NEXT SESSION: Cont with shoulder ROM, strengthening, and stability per pt tolerance.  Review and perform HEP.      Leigh Minerva III PT, DPT 08/06/23 12:47 PM

## 2023-08-06 ENCOUNTER — Encounter (HOSPITAL_BASED_OUTPATIENT_CLINIC_OR_DEPARTMENT_OTHER): Payer: Self-pay | Admitting: Physical Therapy

## 2023-08-06 DIAGNOSIS — G4733 Obstructive sleep apnea (adult) (pediatric): Secondary | ICD-10-CM | POA: Diagnosis not present

## 2023-08-06 LAB — VITAMIN D 25 HYDROXY (VIT D DEFICIENCY, FRACTURES): VITD: 35.37 ng/mL (ref 30.00–100.00)

## 2023-08-06 LAB — PTH, INTACT AND CALCIUM
Calcium: 8.9 mg/dL (ref 8.6–10.4)
PTH: 61 pg/mL (ref 16–77)

## 2023-08-07 ENCOUNTER — Encounter (HOSPITAL_BASED_OUTPATIENT_CLINIC_OR_DEPARTMENT_OTHER): Payer: Self-pay

## 2023-08-07 ENCOUNTER — Ambulatory Visit
Admission: RE | Admit: 2023-08-07 | Discharge: 2023-08-07 | Disposition: A | Discharge status: Home / Self Care | Source: Ambulatory Visit | Attending: Nurse Practitioner | Admitting: Nurse Practitioner

## 2023-08-07 ENCOUNTER — Ambulatory Visit (HOSPITAL_BASED_OUTPATIENT_CLINIC_OR_DEPARTMENT_OTHER): Payer: Self-pay | Attending: Physical Medicine & Rehabilitation

## 2023-08-07 DIAGNOSIS — M25611 Stiffness of right shoulder, not elsewhere classified: Secondary | ICD-10-CM | POA: Diagnosis not present

## 2023-08-07 DIAGNOSIS — M9904 Segmental and somatic dysfunction of sacral region: Secondary | ICD-10-CM | POA: Diagnosis not present

## 2023-08-07 DIAGNOSIS — M9901 Segmental and somatic dysfunction of cervical region: Secondary | ICD-10-CM | POA: Diagnosis not present

## 2023-08-07 DIAGNOSIS — M9903 Segmental and somatic dysfunction of lumbar region: Secondary | ICD-10-CM | POA: Diagnosis not present

## 2023-08-07 DIAGNOSIS — K8020 Calculus of gallbladder without cholecystitis without obstruction: Secondary | ICD-10-CM | POA: Diagnosis not present

## 2023-08-07 DIAGNOSIS — R748 Abnormal levels of other serum enzymes: Secondary | ICD-10-CM | POA: Insufficient documentation

## 2023-08-07 DIAGNOSIS — M25511 Pain in right shoulder: Secondary | ICD-10-CM | POA: Insufficient documentation

## 2023-08-07 DIAGNOSIS — M9902 Segmental and somatic dysfunction of thoracic region: Secondary | ICD-10-CM | POA: Diagnosis not present

## 2023-08-07 DIAGNOSIS — M6281 Muscle weakness (generalized): Secondary | ICD-10-CM | POA: Insufficient documentation

## 2023-08-07 NOTE — Therapy (Signed)
 OUTPATIENT PHYSICAL THERAPY SHOULDER TREATMENT   Patient Name: Wanda Lang MRN: [id number] DOB:November 20, 1950, 73 y.o., female Today's Date: 08/07/2023  END OF SESSION:  PT End of Session - 08/07/23 1135     Visit Number 7    Number of Visits 12    Date for PT Re-Evaluation 08/21/23    Authorization Type human[employer] MCR    Authorization Time Period 07/10/2023-08/31/2023    Authorization - Visit Number 6    Authorization - Number of Visits 15    Progress Note Due on Visit 19    PT Start Time 1134    PT Stop Time 1207    PT Time Calculation (min) 33 min    Activity Tolerance Patient tolerated treatment well    Behavior During Therapy WFL for tasks assessed/performed             Past Medical History:  Diagnosis Date   Allergy    Arthritis    Coronary artery disease    Fatty liver    GERD (gastroesophageal reflux disease)    Heart murmur    Hypertension    Hx   LIVER FUNCTION TESTS, ABNORMAL, HX OF 09/08/2007   Qualifier: Diagnosis of  By: Nelson-Smith CMA (AAMA), Dottie     Neuromuscular disorder (HCC)    Essential Tremor   Overactive bladder    Past Surgical History:  Procedure Laterality Date   ABDOMINAL HYSTERECTOMY     COLONOSCOPY WITH PROPOFOL  N/A 09/04/2018   Procedure: COLONOSCOPY WITH PROPOFOL ;  Surgeon: Janalyn Keene NOVAK, MD;  Location: [facility];  Service: Endoscopy;  Laterality: N/A;   ESOPHAGOGASTRODUODENOSCOPY (EGD) WITH PROPOFOL  N/A 09/04/2018   Procedure: ESOPHAGOGASTRODUODENOSCOPY (EGD) WITH PROPOFOL ;  Surgeon: Janalyn Keene NOVAK, MD;  Location: [facility];  Service: Endoscopy;  Laterality: N/A;   LAPAROSCOPY     for endometriosis x2   MOUTH SURGERY  04/2023   tooth extraction, gum graft   TONSILLECTOMY     TOTAL HIP ARTHROPLASTY Right 05/15/2022   Procedure: RIGHT TOTAL HIP ARTHROPLASTY ANTERIOR APPROACH;  Surgeon: Vernetta Lonni GRADE, MD;  Location: [facility];  Service: Orthopedics;  Laterality: Right;   TOTAL KNEE ARTHROPLASTY Left  07/18/2021   Procedure: TOTAL KNEE ARTHROPLASTY;  Surgeon: Ernie Cough, MD;  Location: [facility];  Service: Orthopedics;  Laterality: Left;   TURBINATE REDUCTION     Patient Active Problem List   Diagnosis Date Noted   Protracted URI 05/03/2023   Acute non-recurrent pansinusitis 02/04/2023   OSA (obstructive sleep apnea) 11/14/2022   Arthritis 11/14/2022   Nocturia 10/05/2022   Hypersomnia 10/05/2022   S/P total hip arthroplasty 05/17/2022   Status post total replacement of right hip 05/15/2022   Overactive bladder 04/04/2022   Postmenopausal estrogen deficiency 11/13/2021   Primary osteoarthritis 11/13/2021   Leg length inequality 11/06/2021   Impaired ambulation 10/23/2021   Contact dermatitis 10/16/2021   SI joint arthritis (HCC) 08/24/2021   Spinal stenosis, lumbar region, without neurogenic claudication 08/24/2021   Degeneration of lumbar intervertebral disc 07/24/2021   S/P total knee arthroplasty, left 07/18/2021   Preop examination 07/07/2021   Right hip pain 07/07/2021   Low back pain 07/07/2021   Drug-induced immunodeficiency (HCC) 06/12/2021   Impingement syndrome of right shoulder region 06/12/2021   COVID-19 01/24/2021   Heart murmur 11/24/2020   Hammer toe 04/22/2020   Metatarsalgia of left foot 04/22/2020   Osteoarthritis of midfoot 04/22/2020   Duodenal ulcer 11/12/2019   Stress 11/12/2019   Morbid obesity (HCC) 11/06/2019   Pain in  left knee 07/30/2019   Aortic stenosis, moderate 02/06/2018   Chronic diarrhea 10/09/2017   Acid reflux 07/15/2017   Asymptomatic carotid artery stenosis 07/15/2017   Cyst of skin 07/15/2017   Right maxillary sinusitis 11/09/2016   Immunocompromised (HCC) 11/09/2016   Allergic rhinitis 11/02/2016   Benign essential tremor 11/02/2016   History of hypertension 11/02/2016   Postmenopausal symptoms 07/01/2015   Fatty liver 09/08/2007   Rheumatoid arthritis (HCC) 09/08/2007     REFERRING PROVIDER: Carilyn Prentice BRAVO,  MD  REFERRING DIAG: [phone] (ICD-10-CM) - Chronic right shoulder pain  THERAPY DIAG:  Right shoulder pain, unspecified chronicity  Stiffness of right shoulder, not elsewhere classified  Muscle weakness (generalized)  Rationale for Evaluation and Treatment: Rehabilitation  ONSET DATE: Late 2024/January 2025  SUBJECTIVE:                                                                                                                                                                                      SUBJECTIVE STATEMENT:  Pt reports she was not sore initially after last session, but felt it later that day/next day. Arrives late today due to mis-remembering appt time. Pt still waiting to hear about shoulder surgery and foot surgery. Seeing Dr. Genelle and Dr. Harden.        Hand dominance: Right  PERTINENT HISTORY: Chronic LBP, multilevel DDD, SI pain R THA 04/24 L TKA 06/23 RA Right Knee End-stage OA  Bladder incontinence    PAIN:  Location:  deep in shoulder, lateral and post shoulder NPRS:  2-3/10 current, 8/10 worst, 0/10 best   PRECAUTIONS: RA, R THA, L TKA  RED FLAGS: Bowel or bladder incontinence: Yes:     WEIGHT BEARING RESTRICTIONS: No  FALLS:  Has patient fallen in last 6 months? No  LIVING ENVIRONMENT: Lives with: lives alone Lives in: 2 story home though mainly lives on the 1st floor Stairs: yes Has following equipment at home: chair lift on stairs, walking stick, FWW  OCCUPATION: retired  PLOF: Independent  PATIENT GOALS:to get rid of pain in shoulder and use her R arm.  NEXT MD VISIT:   OBJECTIVE:  Note: Objective measures were completed at Evaluation unless otherwise noted.  DIAGNOSTIC FINDINGS:  R shoulder x ray:   FINDINGS: There is diffuse decreased bone mineralization. Severe glenohumeral joint space narrowing with bone-on-bone contact, subchondral sclerosis, subchondral cystic change, and moderate superomedial humeral head  and glenoid fossa cortical flattening/remodeling.   Mild acromioclavicular joint space narrowing and peripheral osteophytosis.   No acute fracture or dislocation.   The visualized portion of the right lung is unremarkable.   IMPRESSION: 1. Severe glenohumeral osteoarthritis. 2. Mild acromioclavicular osteoarthritis.  PATIENT SURVEYS:  UEFI:  20/80  COGNITION: Overall cognitive status: Within functional limits for tasks assessed      UPPER EXTREMITY ROM:   AROM/PROM Right eval Left eval  Shoulder flexion 62 with pain 152  Shoulder scaption 52 with pain 155  Shoulder abduction 38 with pain 138  Shoulder adduction    Shoulder internal rotation 47/44   Shoulder external rotation 24/22    Elbow flexion    Elbow extension    Wrist flexion    Wrist extension    Wrist ulnar deviation    Wrist radial deviation    Wrist pronation    Wrist supination    (Blank rows = not tested)  UPPER EXTREMITY MMT:  MMT Right eval Left eval  Shoulder flexion  5/5  Shoulder extension    Shoulder abduction  4+/5  Shoulder adduction    Shoulder internal rotation Tolerated min to mod resistance with pain WFL in sitting  Shoulder external rotation Tolerated min resistance with pain Tolerated mod resistance in sitting  Middle trapezius    Lower trapezius    Elbow flexion    Elbow extension    Wrist flexion    Wrist extension    Wrist ulnar deviation    Wrist radial deviation    Wrist pronation    Wrist supination    Grip strength (lbs)    (Blank rows = not tested)  SHOULDER SPECIAL TESTS: ER lag:  negative with arm at side                                                                                                                             TREATMENT:   08/07/2023 R shoulder PROM Supine well arm assisted flexion x 10 reps Supine wand flexion x 10 reps Supine serratus punch with PTA assist 1x10 reps (significant crepitus) Standing ball rolls at table- flexion 2 x10  (green physioball) ER/IR isometrics with 5 sec hold x 10 reps each Standing row RTB 3 x 10 Standing shoulder extension RTB 2 x 10   08/05/2023 Reviewed pt presentation, HEP compliance, pain level, and response to prior Rx.  Pt received R shoulder PROM in flexion, scaption, and ER per pt and tissue tolerance.   Supine well arm assisted flexion x 10 reps Supine wand flexion x 7 reps Supine serratus punch with PT assist 3x10 reps Standing ball rolls at table- flexion 2 x10 (green physioball) ER/IR isometrics with 5 sec hold x 10 reps each Standing row RTB 2 x 10 Standing shoulder extension RTB 2 x 10     07/31/23 PROM R shoulder  STM to UT, bicep, pec, deltoid GHJ distraction Supine AAROM flexion to 90 1 x 10 Supine well arm assisted flexion 1 x10 Standing ball rolls at table- flexion 2 x10 (green physioball) Standing row RTB 2 x 10 Standing shoulder extension RTB 2 x 10  Shoulder isometrics IR/ER 5 x 10 second holds  07/29/23 PROM R shoulder  STM to UT, bicep, pec GHJ distraction Supine well arm assisted flexion x10 Seated scap retraction 3 2x10 Isometrics-flexion/abduction/extension- x 10 5second holds Standing ball rolls at table- flexion x10 (green physioball)  07/25/23 PROM R shoulder  STM to UT, bicep, pec GHJ distraction Supine AAROM flexion to 90 1 x 10 Isometrics-flexion/abduction/extension- x 10 second holds  6/12 PROM R shoulder  STM to UT, bicep, pec GHJ distraction Supine chest press with cane (attempted, but too painful) Seated scapular retraction 5 2x10 Seated posterior shoulder rolls x15 Isometrics-flexion/abduction/IR/ER/extension- 5 hold x10ea     Eval: Pt performed seated tables slides in flexion x 10 reps and seated scap retraction.  PT instructed pt to not perform into a painful range.  Pt received a HEP handout and was educated in correct form and appropriate frequency.   PATIENT EDUCATION: Education details: dx, HEP, POC, exercise form,  rationale of interventions, objective findings, and rationale of interventions.   Person educated: Patient Education method: Explanation, Demonstration, Tactile cues, Verbal cues, and Handouts Education comprehension: verbalized understanding, returned demonstration, verbal cues required, tactile cues required, and needs further education  HOME EXERCISE PROGRAM: Access Code: [email or web address] URL: [email or web address] Date: 07/11/2023 Prepared by: Mose Minerva  Exercises - Seated Scapular Retraction  - 2 x daily - 7 x weekly - 2 sets - 10 reps - 3 second hold - Seated Shoulder Flexion Towel Slide at Table Top  - 2 x daily - 7 x weekly - 2 sets - 10 reps  ASSESSMENT:  CLINICAL IMPRESSION:  Tx time limited due to late arrival. Significant crepitus noted with supine SA punches, so discontinued after first set. PROM kept within tolerable ranges. Performed well with isometric strengthening. With resisted shoulder extension, she required cues for posture and cervical spine alignment. Will continue to work on ROM, strength, and stability as we await surgery date for R shoulder.    OBJECTIVE IMPAIRMENTS: decreased activity tolerance, decreased ROM, decreased strength, hypomobility, impaired flexibility, impaired UE functional use, and pain.   ACTIVITY LIMITATIONS: bathing, dressing, reach over head, and hygiene/grooming  PARTICIPATION LIMITATIONS: meal prep, cleaning, and laundry  PERSONAL FACTORS: 1-2 comorbidities: RA and chronic back pain are also affecting patient's functional outcome.   REHAB POTENTIAL: Good  CLINICAL DECISION MAKING: Stable/uncomplicated  EVALUATION COMPLEXITY: Low   GOALS:  SHORT TERM GOALS: Target date:  07/31/23   Pt will be independent and compliant with HEP for improved pain, ROM, strength, and function.  Baseline: Goal status: INITIAL  2.  Pt will demo at least a 30 deg increase in flexion, scaption, and abd AROM for improved shoulder mobility  and stiffness.  Baseline:  Goal status: INITIAL  3.  Pt will report at least a 25% improvement in performance of ADL's/self care activities.  Baseline:  Goal status: INITIAL  4.  Pt will be able to wash and dry her hair without significant difficulty.  Baseline:  Goal status: INITIAL Target date:  08/07/2023    LONG TERM GOALS: Target date: 08/21/23  Pt will be able to reach overhead into a cabinet without significant pain and difficulty.  Baseline:  Goal status: INITIAL  2.  Pt will be able to perform her ADLs and IADLs without significant difficulty and pain. Baseline:  Goal status: INITIAL  3.  Pt will demo improved R shoulder AROM to at least 130 deg in flexion and 50 deg in ER for improved reaching and performance of ADLs.  Baseline:  Goal status: INITIAL  4.  Pt will tolerate strengthening and  stability exercises without adverse effects to improve R glenohumeral and scapular strength and stability to perform reaching activities, household chores, and functional lifting activities.   Baseline:  Goal status: INITIAL    PLAN:  PT FREQUENCY: 2x/week  PT DURATION: 6 weeks  PLANNED INTERVENTIONS: 97164- PT Re-evaluation, 97750- Physical Performance Testing, 97110-Therapeutic exercises, 97530- Therapeutic activity, [id number]- Neuromuscular re-education, 97535- Self Care, [id number]- Manual therapy, [id number]- Electrical stimulation (unattended), [id number]- Electrical stimulation (manual), [id number]- Ultrasound, Patient/Family education, Taping, Cryotherapy, and Moist heat  PLAN FOR NEXT SESSION: Cont with shoulder ROM, strengthening, and stability per pt tolerance.  Review and perform HEP.      Asberry Rodes, PTA  08/07/23 12:13 PM

## 2023-08-12 ENCOUNTER — Ambulatory Visit (HOSPITAL_BASED_OUTPATIENT_CLINIC_OR_DEPARTMENT_OTHER): Admitting: Physical Therapy

## 2023-08-12 DIAGNOSIS — M6281 Muscle weakness (generalized): Secondary | ICD-10-CM | POA: Diagnosis not present

## 2023-08-12 DIAGNOSIS — M25511 Pain in right shoulder: Secondary | ICD-10-CM | POA: Diagnosis not present

## 2023-08-12 DIAGNOSIS — M25611 Stiffness of right shoulder, not elsewhere classified: Secondary | ICD-10-CM | POA: Diagnosis not present

## 2023-08-12 NOTE — Therapy (Signed)
 OUTPATIENT PHYSICAL THERAPY SHOULDER TREATMENT   Patient Name: Wanda Lang MRN: [id number] DOB:1950-07-12, 73 y.o., female Today's Date: 08/13/2023  END OF SESSION:  PT End of Session - 08/12/23 1157     Visit Number 8    Number of Visits 12    Date for PT Re-Evaluation 08/21/23    Authorization Type human[employer] MCR    PT Start Time 1155    PT Stop Time 1233    PT Time Calculation (min) 38 min    Activity Tolerance Patient tolerated treatment well    Behavior During Therapy WFL for tasks assessed/performed             Past Medical History:  Diagnosis Date   Allergy    Arthritis    Coronary artery disease    Fatty liver    GERD (gastroesophageal reflux disease)    Heart murmur    Hypertension    Hx   LIVER FUNCTION TESTS, ABNORMAL, HX OF 09/08/2007   Qualifier: Diagnosis of  By: Nelson-Smith CMA (AAMA), Dottie     Neuromuscular disorder (HCC)    Essential Tremor   Overactive bladder    Past Surgical History:  Procedure Laterality Date   ABDOMINAL HYSTERECTOMY     COLONOSCOPY WITH PROPOFOL  N/A 09/04/2018   Procedure: COLONOSCOPY WITH PROPOFOL ;  Surgeon: Janalyn Keene NOVAK, MD;  Location: [facility];  Service: Endoscopy;  Laterality: N/A;   ESOPHAGOGASTRODUODENOSCOPY (EGD) WITH PROPOFOL  N/A 09/04/2018   Procedure: ESOPHAGOGASTRODUODENOSCOPY (EGD) WITH PROPOFOL ;  Surgeon: Janalyn Keene NOVAK, MD;  Location: [facility];  Service: Endoscopy;  Laterality: N/A;   LAPAROSCOPY     for endometriosis x2   MOUTH SURGERY  04/2023   tooth extraction, gum graft   TONSILLECTOMY     TOTAL HIP ARTHROPLASTY Right 05/15/2022   Procedure: RIGHT TOTAL HIP ARTHROPLASTY ANTERIOR APPROACH;  Surgeon: Vernetta Lonni GRADE, MD;  Location: [facility];  Service: Orthopedics;  Laterality: Right;   TOTAL KNEE ARTHROPLASTY Left 07/18/2021   Procedure: TOTAL KNEE ARTHROPLASTY;  Surgeon: Ernie Cough, MD;  Location: [facility];  Service: Orthopedics;  Laterality: Left;   TURBINATE  REDUCTION     Patient Active Problem List   Diagnosis Date Noted   Protracted URI 05/03/2023   Acute non-recurrent pansinusitis 02/04/2023   OSA (obstructive sleep apnea) 11/14/2022   Arthritis 11/14/2022   Nocturia 10/05/2022   Hypersomnia 10/05/2022   S/P total hip arthroplasty 05/17/2022   Status post total replacement of right hip 05/15/2022   Overactive bladder 04/04/2022   Postmenopausal estrogen deficiency 11/13/2021   Primary osteoarthritis 11/13/2021   Leg length inequality 11/06/2021   Impaired ambulation 10/23/2021   Contact dermatitis 10/16/2021   SI joint arthritis (HCC) 08/24/2021   Spinal stenosis, lumbar region, without neurogenic claudication 08/24/2021   Degeneration of lumbar intervertebral disc 07/24/2021   S/P total knee arthroplasty, left 07/18/2021   Preop examination 07/07/2021   Right hip pain 07/07/2021   Low back pain 07/07/2021   Drug-induced immunodeficiency (HCC) 06/12/2021   Impingement syndrome of right shoulder region 06/12/2021   COVID-19 01/24/2021   Heart murmur 11/24/2020   Hammer toe 04/22/2020   Metatarsalgia of left foot 04/22/2020   Osteoarthritis of midfoot 04/22/2020   Duodenal ulcer 11/12/2019   Stress 11/12/2019   Morbid obesity (HCC) 11/06/2019   Pain in left knee 07/30/2019   Aortic stenosis, moderate 02/06/2018   Chronic diarrhea 10/09/2017   Acid reflux 07/15/2017   Asymptomatic carotid artery stenosis 07/15/2017   Cyst of skin 07/15/2017  Right maxillary sinusitis 11/09/2016   Immunocompromised (HCC) 11/09/2016   Allergic rhinitis 11/02/2016   Benign essential tremor 11/02/2016   History of hypertension 11/02/2016   Postmenopausal symptoms 07/01/2015   Fatty liver 09/08/2007   Rheumatoid arthritis (HCC) 09/08/2007     REFERRING PROVIDER: Carilyn Prentice BRAVO, MD  REFERRING DIAG: [phone] (ICD-10-CM) - Chronic right shoulder pain  THERAPY DIAG:  Right shoulder pain, unspecified chronicity  Stiffness of  right shoulder, not elsewhere classified  Muscle weakness (generalized)  Rationale for Evaluation and Treatment: Rehabilitation  ONSET DATE: Late 2024/January 2025  SUBJECTIVE:                                                                                                                                                                                      SUBJECTIVE STATEMENT:  Pt reports she had increased pain after prior Rx which lasted about a day.  Pt sees Dr. Genelle on 7/23 for shoulder. Her pain was a 4/10 this AM though now about a 2-3/10 now.  She took some tylenol  this AM.       Hand dominance: Right  PERTINENT HISTORY: Chronic LBP, multilevel DDD, SI pain R THA 04/24 L TKA 06/23 RA Right Knee End-stage OA  Bladder incontinence    PAIN:  Location:  deep in shoulder, lateral and post shoulder NPRS:  2-3/10 current, 8/10 worst, 0/10 best   PRECAUTIONS: RA, R THA, L TKA  RED FLAGS: Bowel or bladder incontinence: Yes:     WEIGHT BEARING RESTRICTIONS: No  FALLS:  Has patient fallen in last 6 months? No  LIVING ENVIRONMENT: Lives with: lives alone Lives in: 2 story home though mainly lives on the 1st floor Stairs: yes Has following equipment at home: chair lift on stairs, walking stick, FWW  OCCUPATION: retired  PLOF: Independent  PATIENT GOALS:to get rid of pain in shoulder and use her R arm.  NEXT MD VISIT:   OBJECTIVE:  Note: Objective measures were completed at Evaluation unless otherwise noted.  DIAGNOSTIC FINDINGS:  R shoulder x ray:   FINDINGS: There is diffuse decreased bone mineralization. Severe glenohumeral joint space narrowing with bone-on-bone contact, subchondral sclerosis, subchondral cystic change, and moderate superomedial humeral head and glenoid fossa cortical flattening/remodeling.   Mild acromioclavicular joint space narrowing and peripheral osteophytosis.   No acute fracture or dislocation.   The visualized portion of  the right lung is unremarkable.   IMPRESSION: 1. Severe glenohumeral osteoarthritis. 2. Mild acromioclavicular osteoarthritis.   PATIENT SURVEYS:  UEFI:  20/80  COGNITION: Overall cognitive status: Within functional limits for tasks assessed      UPPER EXTREMITY ROM:   AROM/PROM Right eval Left eval  Shoulder flexion 62 with pain 152  Shoulder scaption 52 with pain 155  Shoulder abduction 38 with pain 138  Shoulder adduction    Shoulder internal rotation 47/44   Shoulder external rotation 24/22    Elbow flexion    Elbow extension    Wrist flexion    Wrist extension    Wrist ulnar deviation    Wrist radial deviation    Wrist pronation    Wrist supination    (Blank rows = not tested)  UPPER EXTREMITY MMT:  MMT Right eval Left eval  Shoulder flexion  5/5  Shoulder extension    Shoulder abduction  4+/5  Shoulder adduction    Shoulder internal rotation Tolerated min to mod resistance with pain WFL in sitting  Shoulder external rotation Tolerated min resistance with pain Tolerated mod resistance in sitting  Middle trapezius    Lower trapezius    Elbow flexion    Elbow extension    Wrist flexion    Wrist extension    Wrist ulnar deviation    Wrist radial deviation    Wrist pronation    Wrist supination    Grip strength (lbs)    (Blank rows = not tested)  SHOULDER SPECIAL TESTS: ER lag:  negative with arm at side                                                                                                                             TREATMENT:   08/12/23 Reviewed pt presentation, HEP compliance, pain level, and response to prior Rx.  Pt received R shoulder PROM in flexion, scaption, and ER per pt and tissue tolerance.   Supine well arm assisted flexion x 10 reps Supine serratus punch with PT assist 3x10 reps Supine shoulder flexion with PT assist 2x10 reps Supine wand press 2x10 ER/IR isometrics with 5 sec hold x 10 reps each Standing row RTB 2 x  10 Standing shoulder extension RTB 2 x 10   08/07/2023 R shoulder PROM Supine well arm assisted flexion x 10 reps Supine wand flexion x 10 reps Supine serratus punch with PTA assist 1x10 reps (significant crepitus) Standing ball rolls at table- flexion 2 x10 (green physioball) ER/IR isometrics with 5 sec hold x 10 reps each Standing row RTB 3 x 10 Standing shoulder extension RTB 2 x 10   08/05/2023 Reviewed pt presentation, HEP compliance, pain level, and response to prior Rx.  Pt received R shoulder PROM in flexion, scaption, and ER per pt and tissue tolerance.   Supine well arm assisted flexion x 10 reps Supine wand flexion x 7 reps Supine serratus punch with PT assist 3x10 reps Standing ball rolls at table- flexion 2 x10 (green physioball) ER/IR isometrics with 5 sec hold x 10 reps each Standing row RTB 2 x 10 Standing shoulder extension RTB 2 x 10     07/31/23 PROM R shoulder  STM to UT, bicep, pec, deltoid GHJ distraction Supine AAROM  flexion to 90 1 x 10 Supine well arm assisted flexion 1 x10 Standing ball rolls at table- flexion 2 x10 (green physioball) Standing row RTB 2 x 10 Standing shoulder extension RTB 2 x 10  Shoulder isometrics IR/ER 5 x 10 second holds  07/29/23 PROM R shoulder  STM to UT, bicep, pec GHJ distraction Supine well arm assisted flexion x10 Seated scap retraction 3 2x10 Isometrics-flexion/abduction/extension- x 10 5second holds Standing ball rolls at table- flexion x10 (green physioball)  07/25/23 PROM R shoulder  STM to UT, bicep, pec GHJ distraction Supine AAROM flexion to 90 1 x 10 Isometrics-flexion/abduction/extension- x 10 second holds   PATIENT EDUCATION: Education details: dx, HEP, POC, exercise form, rationale of interventions, and rationale of interventions.   Person educated: Patient Education method: Explanation, Demonstration, Tactile cues, Verbal cues, and Handouts Education comprehension: verbalized understanding,  returned demonstration, verbal cues required, tactile cues required, and needs further education  HOME EXERCISE PROGRAM: Access Code: [email or web address] URL: [email or web address] Date: 07/11/2023 Prepared by: Mose Minerva  Exercises - Seated Scapular Retraction  - 2 x daily - 7 x weekly - 2 sets - 10 reps - 3 second hold - Seated Shoulder Flexion Towel Slide at Table Top  - 2 x daily - 7 x weekly - 2 sets - 10 reps  ASSESSMENT:  CLINICAL IMPRESSION:  PT performed PROM w/n pt and tissue tolerance.  Pt is limited with PROM.  Pt performed exercises well with cuing and instruction in correct form.  PT provided assistance with supine serratus punches and supine flexion ROM.  Pt tolerated exercises well and had no c/o's after Rx.  Pt sees ortho MD on 08/28/23.  PT to continue to work on ROM, strength, and stability for improved function.      OBJECTIVE IMPAIRMENTS: decreased activity tolerance, decreased ROM, decreased strength, hypomobility, impaired flexibility, impaired UE functional use, and pain.   ACTIVITY LIMITATIONS: bathing, dressing, reach over head, and hygiene/grooming  PARTICIPATION LIMITATIONS: meal prep, cleaning, and laundry  PERSONAL FACTORS: 1-2 comorbidities: RA and chronic back pain are also affecting patient's functional outcome.   REHAB POTENTIAL: Good  CLINICAL DECISION MAKING: Stable/uncomplicated  EVALUATION COMPLEXITY: Low   GOALS:  SHORT TERM GOALS: Target date:  07/31/23   Pt will be independent and compliant with HEP for improved pain, ROM, strength, and function.  Baseline: Goal status: INITIAL  2.  Pt will demo at least a 30 deg increase in flexion, scaption, and abd AROM for improved shoulder mobility and stiffness.  Baseline:  Goal status: INITIAL  3.  Pt will report at least a 25% improvement in performance of ADL's/self care activities.  Baseline:  Goal status: INITIAL  4.  Pt will be able to wash and dry her hair without significant  difficulty.  Baseline:  Goal status: INITIAL Target date:  08/07/2023    LONG TERM GOALS: Target date: 08/21/23  Pt will be able to reach overhead into a cabinet without significant pain and difficulty.  Baseline:  Goal status: INITIAL  2.  Pt will be able to perform her ADLs and IADLs without significant difficulty and pain. Baseline:  Goal status: INITIAL  3.  Pt will demo improved R shoulder AROM to at least 130 deg in flexion and 50 deg in ER for improved reaching and performance of ADLs.  Baseline:  Goal status: INITIAL  4.  Pt will tolerate strengthening and stability exercises without adverse effects to improve R glenohumeral and scapular strength and stability to perform reaching activities, household  chores, and functional lifting activities.   Baseline:  Goal status: INITIAL    PLAN:  PT FREQUENCY: 2x/week  PT DURATION: 6 weeks  PLANNED INTERVENTIONS: 97164- PT Re-evaluation, 97750- Physical Performance Testing, 97110-Therapeutic exercises, 97530- Therapeutic activity, [id number]- Neuromuscular re-education, 97535- Self Care, [id number]- Manual therapy, [id number]- Electrical stimulation (unattended), [id number]- Electrical stimulation (manual), [id number]- Ultrasound, Patient/Family education, Taping, Cryotherapy, and Moist heat  PLAN FOR NEXT SESSION: Cont with shoulder ROM, strengthening, and stability per pt tolerance.  Review and perform HEP.      Leigh Minerva III PT, DPT 08/13/23 10:10 PM

## 2023-08-13 ENCOUNTER — Ambulatory Visit: Payer: Self-pay | Admitting: Nurse Practitioner

## 2023-08-13 ENCOUNTER — Encounter (HOSPITAL_BASED_OUTPATIENT_CLINIC_OR_DEPARTMENT_OTHER): Payer: Self-pay | Admitting: Physical Therapy

## 2023-08-14 ENCOUNTER — Encounter (HOSPITAL_BASED_OUTPATIENT_CLINIC_OR_DEPARTMENT_OTHER): Admitting: Physical Therapy

## 2023-08-14 ENCOUNTER — Other Ambulatory Visit (INDEPENDENT_AMBULATORY_CARE_PROVIDER_SITE_OTHER)

## 2023-08-14 DIAGNOSIS — R748 Abnormal levels of other serum enzymes: Secondary | ICD-10-CM | POA: Diagnosis not present

## 2023-08-14 DIAGNOSIS — M9901 Segmental and somatic dysfunction of cervical region: Secondary | ICD-10-CM | POA: Diagnosis not present

## 2023-08-14 DIAGNOSIS — M9902 Segmental and somatic dysfunction of thoracic region: Secondary | ICD-10-CM | POA: Diagnosis not present

## 2023-08-14 DIAGNOSIS — M9903 Segmental and somatic dysfunction of lumbar region: Secondary | ICD-10-CM | POA: Diagnosis not present

## 2023-08-14 DIAGNOSIS — M9904 Segmental and somatic dysfunction of sacral region: Secondary | ICD-10-CM | POA: Diagnosis not present

## 2023-08-15 DIAGNOSIS — Z20828 Contact with and (suspected) exposure to other viral communicable diseases: Secondary | ICD-10-CM | POA: Diagnosis not present

## 2023-08-16 ENCOUNTER — Ambulatory Visit (HOSPITAL_BASED_OUTPATIENT_CLINIC_OR_DEPARTMENT_OTHER): Payer: Self-pay

## 2023-08-16 ENCOUNTER — Encounter (HOSPITAL_BASED_OUTPATIENT_CLINIC_OR_DEPARTMENT_OTHER): Payer: Self-pay

## 2023-08-16 DIAGNOSIS — M25611 Stiffness of right shoulder, not elsewhere classified: Secondary | ICD-10-CM | POA: Diagnosis not present

## 2023-08-16 DIAGNOSIS — M6281 Muscle weakness (generalized): Secondary | ICD-10-CM

## 2023-08-16 DIAGNOSIS — M25511 Pain in right shoulder: Secondary | ICD-10-CM

## 2023-08-16 NOTE — Therapy (Signed)
 OUTPATIENT PHYSICAL THERAPY SHOULDER TREATMENT   Patient Name: Wanda Lang MRN: [id number] DOB:1950-09-15, 73 y.o., female Today's Date: 08/16/2023  END OF SESSION:  PT End of Session - 08/16/23 1304     Visit Number 9    Number of Visits 12    Date for PT Re-Evaluation 08/21/23    Authorization Type human[employer] MCR    Authorization Time Period 07/10/2023-08/31/2023    Authorization - Visit Number 7    Authorization - Number of Visits 15    Progress Note Due on Visit 19    PT Start Time 1304    PT Stop Time 1347    PT Time Calculation (min) 43 min    Activity Tolerance Patient tolerated treatment well    Behavior During Therapy WFL for tasks assessed/performed              Past Medical History:  Diagnosis Date   Allergy    Arthritis    Coronary artery disease    Fatty liver    GERD (gastroesophageal reflux disease)    Heart murmur    Hypertension    Hx   LIVER FUNCTION TESTS, ABNORMAL, HX OF 09/08/2007   Qualifier: Diagnosis of  By: Nelson-Smith CMA (AAMA), Dottie     Neuromuscular disorder (HCC)    Essential Tremor   Overactive bladder    Past Surgical History:  Procedure Laterality Date   ABDOMINAL HYSTERECTOMY     COLONOSCOPY WITH PROPOFOL  N/A 09/04/2018   Procedure: COLONOSCOPY WITH PROPOFOL ;  Surgeon: Janalyn Keene NOVAK, MD;  Location: [facility];  Service: Endoscopy;  Laterality: N/A;   ESOPHAGOGASTRODUODENOSCOPY (EGD) WITH PROPOFOL  N/A 09/04/2018   Procedure: ESOPHAGOGASTRODUODENOSCOPY (EGD) WITH PROPOFOL ;  Surgeon: Janalyn Keene NOVAK, MD;  Location: [facility];  Service: Endoscopy;  Laterality: N/A;   LAPAROSCOPY     for endometriosis x2   MOUTH SURGERY  04/2023   tooth extraction, gum graft   TONSILLECTOMY     TOTAL HIP ARTHROPLASTY Right 05/15/2022   Procedure: RIGHT TOTAL HIP ARTHROPLASTY ANTERIOR APPROACH;  Surgeon: Vernetta Lonni GRADE, MD;  Location: [facility];  Service: Orthopedics;  Laterality: Right;   TOTAL KNEE ARTHROPLASTY  Left 07/18/2021   Procedure: TOTAL KNEE ARTHROPLASTY;  Surgeon: Ernie Cough, MD;  Location: [facility];  Service: Orthopedics;  Laterality: Left;   TURBINATE REDUCTION     Patient Active Problem List   Diagnosis Date Noted   Protracted URI 05/03/2023   Acute non-recurrent pansinusitis 02/04/2023   OSA (obstructive sleep apnea) 11/14/2022   Arthritis 11/14/2022   Nocturia 10/05/2022   Hypersomnia 10/05/2022   S/P total hip arthroplasty 05/17/2022   Status post total replacement of right hip 05/15/2022   Overactive bladder 04/04/2022   Postmenopausal estrogen deficiency 11/13/2021   Primary osteoarthritis 11/13/2021   Leg length inequality 11/06/2021   Impaired ambulation 10/23/2021   Contact dermatitis 10/16/2021   SI joint arthritis (HCC) 08/24/2021   Spinal stenosis, lumbar region, without neurogenic claudication 08/24/2021   Degeneration of lumbar intervertebral disc 07/24/2021   S/P total knee arthroplasty, left 07/18/2021   Preop examination 07/07/2021   Right hip pain 07/07/2021   Low back pain 07/07/2021   Drug-induced immunodeficiency (HCC) 06/12/2021   Impingement syndrome of right shoulder region 06/12/2021   COVID-19 01/24/2021   Heart murmur 11/24/2020   Hammer toe 04/22/2020   Metatarsalgia of left foot 04/22/2020   Osteoarthritis of midfoot 04/22/2020   Duodenal ulcer 11/12/2019   Stress 11/12/2019   Morbid obesity (HCC) 11/06/2019   Pain  in left knee 07/30/2019   Aortic stenosis, moderate 02/06/2018   Chronic diarrhea 10/09/2017   Acid reflux 07/15/2017   Asymptomatic carotid artery stenosis 07/15/2017   Cyst of skin 07/15/2017   Right maxillary sinusitis 11/09/2016   Immunocompromised (HCC) 11/09/2016   Allergic rhinitis 11/02/2016   Benign essential tremor 11/02/2016   History of hypertension 11/02/2016   Postmenopausal symptoms 07/01/2015   Fatty liver 09/08/2007   Rheumatoid arthritis (HCC) 09/08/2007     REFERRING PROVIDER: Carilyn Prentice BRAVO,  MD  REFERRING DIAG: [phone] (ICD-10-CM) - Chronic right shoulder pain  THERAPY DIAG:  Right shoulder pain, unspecified chronicity  Stiffness of right shoulder, not elsewhere classified  Muscle weakness (generalized)  Rationale for Evaluation and Treatment: Rehabilitation  ONSET DATE: Late 2024/January 2025  SUBJECTIVE:                                                                                                                                                                                      SUBJECTIVE STATEMENT:  Pt reports she just started using magnesium  lotion which seems to help decrease her pain.        Hand dominance: Right  PERTINENT HISTORY: Chronic LBP, multilevel DDD, SI pain R THA 04/24 L TKA 06/23 RA Right Knee End-stage OA  Bladder incontinence    PAIN:  Location:  deep in shoulder, lateral and post shoulder NPRS:  2-3/10 current, 8/10 worst, 0/10 best   PRECAUTIONS: RA, R THA, L TKA  RED FLAGS: Bowel or bladder incontinence: Yes:     WEIGHT BEARING RESTRICTIONS: No  FALLS:  Has patient fallen in last 6 months? No  LIVING ENVIRONMENT: Lives with: lives alone Lives in: 2 story home though mainly lives on the 1st floor Stairs: yes Has following equipment at home: chair lift on stairs, walking stick, FWW  OCCUPATION: retired  PLOF: Independent  PATIENT GOALS:to get rid of pain in shoulder and use her R arm.  NEXT MD VISIT:   OBJECTIVE:  Note: Objective measures were completed at Evaluation unless otherwise noted.  DIAGNOSTIC FINDINGS:  R shoulder x ray:   FINDINGS: There is diffuse decreased bone mineralization. Severe glenohumeral joint space narrowing with bone-on-bone contact, subchondral sclerosis, subchondral cystic change, and moderate superomedial humeral head and glenoid fossa cortical flattening/remodeling.   Mild acromioclavicular joint space narrowing and peripheral osteophytosis.   No acute fracture or  dislocation.   The visualized portion of the right lung is unremarkable.   IMPRESSION: 1. Severe glenohumeral osteoarthritis. 2. Mild acromioclavicular osteoarthritis.   PATIENT SURVEYS:  UEFI:  20/80  COGNITION: Overall cognitive status: Within functional limits for tasks assessed      UPPER EXTREMITY  ROM:   AROM/PROM Right eval Left eval  Shoulder flexion 62 with pain 152  Shoulder scaption 52 with pain 155  Shoulder abduction 38 with pain 138  Shoulder adduction    Shoulder internal rotation 47/44   Shoulder external rotation 24/22    Elbow flexion    Elbow extension    Wrist flexion    Wrist extension    Wrist ulnar deviation    Wrist radial deviation    Wrist pronation    Wrist supination    (Blank rows = not tested)  UPPER EXTREMITY MMT:  MMT Right eval Left eval  Shoulder flexion  5/5  Shoulder extension    Shoulder abduction  4+/5  Shoulder adduction    Shoulder internal rotation Tolerated min to mod resistance with pain WFL in sitting  Shoulder external rotation Tolerated min resistance with pain Tolerated mod resistance in sitting  Middle trapezius    Lower trapezius    Elbow flexion    Elbow extension    Wrist flexion    Wrist extension    Wrist ulnar deviation    Wrist radial deviation    Wrist pronation    Wrist supination    Grip strength (lbs)    (Blank rows = not tested)  SHOULDER SPECIAL TESTS: ER lag:  negative with arm at side                                                                                                                             TREATMENT:   08/16/2023 R shoulder PROM Supine well arm assisted flexion x 10 reps Supine wand flexion x 10 reps Supine serratus punch  2x10 reps Standing ball rolls at table- flexion 2 x10 (green physioball), 1x10 scaption ER/IR/ext/flex isometrics with 5 sec hold x 10 reps each Standing row GTB 2 x 10 Standing shoulder extension GTB 2 x 10  Standing shoulder flexion in front of  mirror (partial range) 2x10- cues for decreasing shoulder hike Standing short arc abduction bil 2x10 Shoulder extension with cane behind back 2x10   08/12/23 Reviewed pt presentation, HEP compliance, pain level, and response to prior Rx.  Pt received R shoulder PROM in flexion, scaption, and ER per pt and tissue tolerance.   Supine well arm assisted flexion x 10 reps Supine serratus punch with PT assist 3x10 reps Supine shoulder flexion with PT assist 2x10 reps Supine wand press 2x10 ER/IR isometrics with 5 sec hold x 10 reps each Standing row RTB 2 x 10 Standing shoulder extension RTB 2 x 10   08/07/2023 R shoulder PROM Supine well arm assisted flexion x 10 reps Supine wand flexion x 10 reps Supine serratus punch with PTA assist 1x10 reps (significant crepitus) Standing ball rolls at table- flexion 2 x10 (green physioball) ER/IR isometrics with 5 sec hold x 10 reps each Standing row RTB 3 x 10 Standing shoulder extension RTB 2 x 10   08/05/2023 Reviewed pt presentation, HEP compliance,  pain level, and response to prior Rx.  Pt received R shoulder PROM in flexion, scaption, and ER per pt and tissue tolerance.   Supine well arm assisted flexion x 10 reps Supine wand flexion x 7 reps Supine serratus punch with PT assist 3x10 reps Standing ball rolls at table- flexion 2 x10 (green physioball) ER/IR isometrics with 5 sec hold x 10 reps each Standing row RTB 2 x 10 Standing shoulder extension RTB 2 x 10     07/31/23 PROM R shoulder  STM to UT, bicep, pec, deltoid GHJ distraction Supine AAROM flexion to 90 1 x 10 Supine well arm assisted flexion 1 x10 Standing ball rolls at table- flexion 2 x10 (green physioball) Standing row RTB 2 x 10 Standing shoulder extension RTB 2 x 10  Shoulder isometrics IR/ER 5 x 10 second holds  07/29/23 PROM R shoulder  STM to UT, bicep, pec GHJ distraction Supine well arm assisted flexion x10 Seated scap retraction 3  2x10 Isometrics-flexion/abduction/extension- x 10 5second holds Standing ball rolls at table- flexion x10 (green physioball)  07/25/23 PROM R shoulder  STM to UT, bicep, pec GHJ distraction Supine AAROM flexion to 90 1 x 10 Isometrics-flexion/abduction/extension- x 10 second holds   PATIENT EDUCATION: Education details: dx, HEP, POC, exercise form, rationale of interventions, and rationale of interventions.   Person educated: Patient Education method: Explanation, Demonstration, Tactile cues, Verbal cues, and Handouts Education comprehension: verbalized understanding, returned demonstration, verbal cues required, tactile cues required, and needs further education  HOME EXERCISE PROGRAM: Access Code: [email or web address] URL: [email or web address] Date: 07/11/2023 Prepared by: Mose Minerva  Exercises - Seated Scapular Retraction  - 2 x daily - 7 x weekly - 2 sets - 10 reps - 3 second hold - Seated Shoulder Flexion Towel Slide at Table Top  - 2 x daily - 7 x weekly - 2 sets - 10 reps  ASSESSMENT:  CLINICAL IMPRESSION:  Pt remains limited with PROM, partially due to guarding. She is able to complete well arm assisted flexion with better ROM than with passive movement. She is limited in active flexion against gravity and requires cuing to decrease UT hiking. No complaints with addition of shoulder extension behind back with cane. Increased resistance with theraband rows and shoulder extension which was tolerated well. Will continue to progress functional strength and ROM as tolerated.   OBJECTIVE IMPAIRMENTS: decreased activity tolerance, decreased ROM, decreased strength, hypomobility, impaired flexibility, impaired UE functional use, and pain.   ACTIVITY LIMITATIONS: bathing, dressing, reach over head, and hygiene/grooming  PARTICIPATION LIMITATIONS: meal prep, cleaning, and laundry  PERSONAL FACTORS: 1-2 comorbidities: RA and chronic back pain are also affecting patient's  functional outcome.   REHAB POTENTIAL: Good  CLINICAL DECISION MAKING: Stable/uncomplicated  EVALUATION COMPLEXITY: Low   GOALS:  SHORT TERM GOALS: Target date:  07/31/23   Pt will be independent and compliant with HEP for improved pain, ROM, strength, and function.  Baseline: Goal status: INITIAL  2.  Pt will demo at least a 30 deg increase in flexion, scaption, and abd AROM for improved shoulder mobility and stiffness.  Baseline:  Goal status: INITIAL  3.  Pt will report at least a 25% improvement in performance of ADL's/self care activities.  Baseline:  Goal status: INITIAL  4.  Pt will be able to wash and dry her hair without significant difficulty.  Baseline:  Goal status: INITIAL Target date:  08/07/2023    LONG TERM GOALS: Target date: 08/21/23  Pt will be able to reach overhead  into a cabinet without significant pain and difficulty.  Baseline:  Goal status: INITIAL  2.  Pt will be able to perform her ADLs and IADLs without significant difficulty and pain. Baseline:  Goal status: INITIAL  3.  Pt will demo improved R shoulder AROM to at least 130 deg in flexion and 50 deg in ER for improved reaching and performance of ADLs.  Baseline:  Goal status: INITIAL  4.  Pt will tolerate strengthening and stability exercises without adverse effects to improve R glenohumeral and scapular strength and stability to perform reaching activities, household chores, and functional lifting activities.   Baseline:  Goal status: INITIAL    PLAN:  PT FREQUENCY: 2x/week  PT DURATION: 6 weeks  PLANNED INTERVENTIONS: [id number]- PT Re-evaluation, 97750- Physical Performance Testing, 97110-Therapeutic exercises, 97530- Therapeutic activity, [id number]- Neuromuscular re-education, 97535- Self Care, [id number]- Manual therapy, [id number]- Electrical stimulation (unattended), [id number]- Electrical stimulation (manual), [id number]- Ultrasound, Patient/Family education, Taping, Cryotherapy, and Moist heat  PLAN  FOR NEXT SESSION: Cont with shoulder ROM, strengthening, and stability per pt tolerance.  Review and perform HEP.     Asberry Rodes, PTA  08/16/23 3:57 PM

## 2023-08-17 LAB — ALKALINE PHOSPHATASE, ISOENZYMES
Alkaline Phosphatase: 448 IU/L — ABNORMAL HIGH (ref 44–121)
BONE FRACTION: 28 % (ref 14–68)
INTESTINAL FRAC.: 2 % (ref 0–18)
LIVER FRACTION: 70 % (ref 18–85)

## 2023-08-19 ENCOUNTER — Ambulatory Visit (HOSPITAL_BASED_OUTPATIENT_CLINIC_OR_DEPARTMENT_OTHER)

## 2023-08-19 ENCOUNTER — Encounter (HOSPITAL_BASED_OUTPATIENT_CLINIC_OR_DEPARTMENT_OTHER): Payer: Self-pay

## 2023-08-19 DIAGNOSIS — M25511 Pain in right shoulder: Secondary | ICD-10-CM

## 2023-08-19 DIAGNOSIS — M25611 Stiffness of right shoulder, not elsewhere classified: Secondary | ICD-10-CM | POA: Diagnosis not present

## 2023-08-19 DIAGNOSIS — M6281 Muscle weakness (generalized): Secondary | ICD-10-CM | POA: Diagnosis not present

## 2023-08-19 NOTE — Therapy (Signed)
 OUTPATIENT PHYSICAL THERAPY SHOULDER TREATMENT   Patient Name: Wanda Lang MRN: [id number] DOB:August 04, 1950, 73 y.o., female Today's Date: 08/19/2023  END OF SESSION:  PT End of Session - 08/19/23 1127     Visit Number 10    Number of Visits 12    Date for PT Re-Evaluation 08/21/23    Authorization Type human[employer] MCR    Authorization Time Period 07/10/2023-08/31/2023    Authorization - Visit Number 8    Authorization - Number of Visits 15    Progress Note Due on Visit 19    PT Start Time 1106    PT Stop Time 1142    PT Time Calculation (min) 36 min    Activity Tolerance Patient tolerated treatment well    Behavior During Therapy WFL for tasks assessed/performed               Past Medical History:  Diagnosis Date   Allergy    Arthritis    Coronary artery disease    Fatty liver    GERD (gastroesophageal reflux disease)    Heart murmur    Hypertension    Hx   LIVER FUNCTION TESTS, ABNORMAL, HX OF 09/08/2007   Qualifier: Diagnosis of  By: Nelson-Smith CMA (AAMA), Dottie     Neuromuscular disorder (HCC)    Essential Tremor   Overactive bladder    Past Surgical History:  Procedure Laterality Date   ABDOMINAL HYSTERECTOMY     COLONOSCOPY WITH PROPOFOL  N/A 09/04/2018   Procedure: COLONOSCOPY WITH PROPOFOL ;  Surgeon: Janalyn Keene NOVAK, MD;  Location: [facility];  Service: Endoscopy;  Laterality: N/A;   ESOPHAGOGASTRODUODENOSCOPY (EGD) WITH PROPOFOL  N/A 09/04/2018   Procedure: ESOPHAGOGASTRODUODENOSCOPY (EGD) WITH PROPOFOL ;  Surgeon: Janalyn Keene NOVAK, MD;  Location: [facility];  Service: Endoscopy;  Laterality: N/A;   LAPAROSCOPY     for endometriosis x2   MOUTH SURGERY  04/2023   tooth extraction, gum graft   TONSILLECTOMY     TOTAL HIP ARTHROPLASTY Right 05/15/2022   Procedure: RIGHT TOTAL HIP ARTHROPLASTY ANTERIOR APPROACH;  Surgeon: Vernetta Lonni GRADE, MD;  Location: [facility];  Service: Orthopedics;  Laterality: Right;   TOTAL KNEE  ARTHROPLASTY Left 07/18/2021   Procedure: TOTAL KNEE ARTHROPLASTY;  Surgeon: Ernie Cough, MD;  Location: [facility];  Service: Orthopedics;  Laterality: Left;   TURBINATE REDUCTION     Patient Active Problem List   Diagnosis Date Noted   Protracted URI 05/03/2023   Acute non-recurrent pansinusitis 02/04/2023   OSA (obstructive sleep apnea) 11/14/2022   Arthritis 11/14/2022   Nocturia 10/05/2022   Hypersomnia 10/05/2022   S/P total hip arthroplasty 05/17/2022   Status post total replacement of right hip 05/15/2022   Overactive bladder 04/04/2022   Postmenopausal estrogen deficiency 11/13/2021   Primary osteoarthritis 11/13/2021   Leg length inequality 11/06/2021   Impaired ambulation 10/23/2021   Contact dermatitis 10/16/2021   SI joint arthritis (HCC) 08/24/2021   Spinal stenosis, lumbar region, without neurogenic claudication 08/24/2021   Degeneration of lumbar intervertebral disc 07/24/2021   S/P total knee arthroplasty, left 07/18/2021   Preop examination 07/07/2021   Right hip pain 07/07/2021   Low back pain 07/07/2021   Drug-induced immunodeficiency (HCC) 06/12/2021   Impingement syndrome of right shoulder region 06/12/2021   COVID-19 01/24/2021   Heart murmur 11/24/2020   Hammer toe 04/22/2020   Metatarsalgia of left foot 04/22/2020   Osteoarthritis of midfoot 04/22/2020   Duodenal ulcer 11/12/2019   Stress 11/12/2019   Morbid obesity (HCC) 11/06/2019  Pain in left knee 07/30/2019   Aortic stenosis, moderate 02/06/2018   Chronic diarrhea 10/09/2017   Acid reflux 07/15/2017   Asymptomatic carotid artery stenosis 07/15/2017   Cyst of skin 07/15/2017   Right maxillary sinusitis 11/09/2016   Immunocompromised (HCC) 11/09/2016   Allergic rhinitis 11/02/2016   Benign essential tremor 11/02/2016   History of hypertension 11/02/2016   Postmenopausal symptoms 07/01/2015   Fatty liver 09/08/2007   Rheumatoid arthritis (HCC) 09/08/2007     REFERRING PROVIDER:  Carilyn Prentice BRAVO, MD  REFERRING DIAG: [phone] (ICD-10-CM) - Chronic right shoulder pain  THERAPY DIAG:  Right shoulder pain, unspecified chronicity  Stiffness of right shoulder, not elsewhere classified  Muscle weakness (generalized)  Rationale for Evaluation and Treatment: Rehabilitation  ONSET DATE: Late 2024/January 2025  SUBJECTIVE:                                                                                                                                                                                      SUBJECTIVE STATEMENT:  Pt reports she over did it helping with her church breakfast this weekend. I'm moving slow today.. 1/10 shoulder pain at entry. It's not bad right  now. Planning to see MD on 7/23 to discuss shoulder surgery. Waiting to schedule foot surgery as well.        Hand dominance: Right  PERTINENT HISTORY: Chronic LBP, multilevel DDD, SI pain R THA 04/24 L TKA 06/23 RA Right Knee End-stage OA  Bladder incontinence    PAIN:  Location:  deep in shoulder, lateral and post shoulder NPRS:  2-3/10 current, 8/10 worst, 0/10 best   PRECAUTIONS: RA, R THA, L TKA  RED FLAGS: Bowel or bladder incontinence: Yes:     WEIGHT BEARING RESTRICTIONS: No  FALLS:  Has patient fallen in last 6 months? No  LIVING ENVIRONMENT: Lives with: lives alone Lives in: 2 story home though mainly lives on the 1st floor Stairs: yes Has following equipment at home: chair lift on stairs, walking stick, FWW  OCCUPATION: retired  PLOF: Independent  PATIENT GOALS:to get rid of pain in shoulder and use her R arm.  NEXT MD VISIT:   OBJECTIVE:  Note: Objective measures were completed at Evaluation unless otherwise noted.  DIAGNOSTIC FINDINGS:  R shoulder x ray:   FINDINGS: There is diffuse decreased bone mineralization. Severe glenohumeral joint space narrowing with bone-on-bone contact, subchondral sclerosis, subchondral cystic change, and  moderate superomedial humeral head and glenoid fossa cortical flattening/remodeling.   Mild acromioclavicular joint space narrowing and peripheral osteophytosis.   No acute fracture or dislocation.   The visualized portion of the right lung is unremarkable.   IMPRESSION: 1. Severe glenohumeral  osteoarthritis. 2. Mild acromioclavicular osteoarthritis.   PATIENT SURVEYS:  UEFI:  20/80  COGNITION: Overall cognitive status: Within functional limits for tasks assessed      UPPER EXTREMITY ROM:   AROM/PROM Right eval Left eval  Shoulder flexion 62 with pain 152  Shoulder scaption 52 with pain 155  Shoulder abduction 38 with pain 138  Shoulder adduction    Shoulder internal rotation 47/44   Shoulder external rotation 24/22    Elbow flexion    Elbow extension    Wrist flexion    Wrist extension    Wrist ulnar deviation    Wrist radial deviation    Wrist pronation    Wrist supination    (Blank rows = not tested)  UPPER EXTREMITY MMT:  MMT Right eval Left eval  Shoulder flexion  5/5  Shoulder extension    Shoulder abduction  4+/5  Shoulder adduction    Shoulder internal rotation Tolerated min to mod resistance with pain WFL in sitting  Shoulder external rotation Tolerated min resistance with pain Tolerated mod resistance in sitting  Middle trapezius    Lower trapezius    Elbow flexion    Elbow extension    Wrist flexion    Wrist extension    Wrist ulnar deviation    Wrist radial deviation    Wrist pronation    Wrist supination    Grip strength (lbs)    (Blank rows = not tested)  SHOULDER SPECIAL TESTS: ER lag:  negative with arm at side                                                                                                                             TREATMENT:   08/19/2023 R shoulder PROM Rhythmic stabilization IR/ER at 45deg ab- 1/2 roll under arm (gentle pressure) Supine well arm assisted flexion x 10 reps Supine wand flexion x 10  reps Supine serratus punch  2x10 reps Standing ball rolls at table- flexion 2 x10 (green physioball), 2x10 scaption ER/IR/ext/flex isometrics with 5 sec hold x 10 reps each Standing row GTB 2 x 15 Standing shoulder extension GTB 2 x 15 Standing shoulder flexion in front of mirror (partial range) 2x10- cues for decreasing shoulder hike  Shoulder extension with cane behind back 2x10  08/16/2023 R shoulder PROM Supine well arm assisted flexion x 10 reps Supine wand flexion x 10 reps Supine serratus punch  2x10 reps Standing ball rolls at table- flexion 2 x10 (green physioball), 1x10 scaption ER/IR/ext/flex isometrics with 5 sec hold x 10 reps each Standing row GTB 2 x 10 Standing shoulder extension GTB 2 x 10  Standing shoulder flexion in front of mirror (partial range) 2x10- cues for decreasing shoulder hike Standing short arc abduction bil 2x10 Shoulder extension with cane behind back 2x10   08/12/23 Reviewed pt presentation, HEP compliance, pain level, and response to prior Rx.  Pt received R shoulder PROM in flexion, scaption, and ER per pt and  tissue tolerance.   Supine well arm assisted flexion x 10 reps Supine serratus punch with PT assist 3x10 reps Supine shoulder flexion with PT assist 2x10 reps Supine wand press 2x10 ER/IR isometrics with 5 sec hold x 10 reps each Standing row RTB 2 x 10 Standing shoulder extension RTB 2 x 10     PATIENT EDUCATION: Education details: dx, HEP, POC, exercise form, rationale of interventions, and rationale of interventions.   Person educated: Patient Education method: Explanation, Demonstration, Tactile cues, Verbal cues, and Handouts Education comprehension: verbalized understanding, returned demonstration, verbal cues required, tactile cues required, and needs further education  HOME EXERCISE PROGRAM: Access Code: [email or web address] URL: [email or web address] Date: 07/11/2023 Prepared by: Mose Minerva  Exercises - Seated  Scapular Retraction  - 2 x daily - 7 x weekly - 2 sets - 10 reps - 3 second hold - Seated Shoulder Flexion Towel Slide at Table Top  - 2 x daily - 7 x weekly - 2 sets - 10 reps  ASSESSMENT:  CLINICAL IMPRESSION:  Continued to work on pain free ROM and strengthening. Pt remains limited by pain with PROM. With passive abduction, palpable crepitus observed, so did not push this movement. No complaints with theraband strengthening. Will continue to progress as tolerated. She sees MD 7/23.   OBJECTIVE IMPAIRMENTS: decreased activity tolerance, decreased ROM, decreased strength, hypomobility, impaired flexibility, impaired UE functional use, and pain.   ACTIVITY LIMITATIONS: bathing, dressing, reach over head, and hygiene/grooming  PARTICIPATION LIMITATIONS: meal prep, cleaning, and laundry  PERSONAL FACTORS: 1-2 comorbidities: RA and chronic back pain are also affecting patient's functional outcome.   REHAB POTENTIAL: Good  CLINICAL DECISION MAKING: Stable/uncomplicated  EVALUATION COMPLEXITY: Low   GOALS:  SHORT TERM GOALS: Target date:  07/31/23   Pt will be independent and compliant with HEP for improved pain, ROM, strength, and function.  Baseline: Goal status: INITIAL  2.  Pt will demo at least a 30 deg increase in flexion, scaption, and abd AROM for improved shoulder mobility and stiffness.  Baseline:  Goal status: INITIAL  3.  Pt will report at least a 25% improvement in performance of ADL's/self care activities.  Baseline:  Goal status: INITIAL  4.  Pt will be able to wash and dry her hair without significant difficulty.  Baseline:  Goal status: INITIAL Target date:  08/07/2023    LONG TERM GOALS: Target date: 08/21/23  Pt will be able to reach overhead into a cabinet without significant pain and difficulty.  Baseline:  Goal status: INITIAL  2.  Pt will be able to perform her ADLs and IADLs without significant difficulty and pain. Baseline:  Goal status:  INITIAL  3.  Pt will demo improved R shoulder AROM to at least 130 deg in flexion and 50 deg in ER for improved reaching and performance of ADLs.  Baseline:  Goal status: INITIAL  4.  Pt will tolerate strengthening and stability exercises without adverse effects to improve R glenohumeral and scapular strength and stability to perform reaching activities, household chores, and functional lifting activities.   Baseline:  Goal status: INITIAL    PLAN:  PT FREQUENCY: 2x/week  PT DURATION: 6 weeks  PLANNED INTERVENTIONS: 97164- PT Re-evaluation, 97750- Physical Performance Testing, 97110-Therapeutic exercises, 97530- Therapeutic activity, [id number]- Neuromuscular re-education, 97535- Self Care, [id number]- Manual therapy, [id number]- Electrical stimulation (unattended), [id number]- Electrical stimulation (manual), [id number]- Ultrasound, Patient/Family education, Taping, Cryotherapy, and Moist heat  PLAN FOR NEXT SESSION: Cont with shoulder ROM, strengthening, and stability per pt tolerance.  Review and perform HEP.     Asberry Rodes, PTA  08/19/23 12:09 PM

## 2023-08-21 ENCOUNTER — Encounter (HOSPITAL_BASED_OUTPATIENT_CLINIC_OR_DEPARTMENT_OTHER): Payer: Self-pay | Admitting: Physical Therapy

## 2023-08-21 ENCOUNTER — Ambulatory Visit (HOSPITAL_BASED_OUTPATIENT_CLINIC_OR_DEPARTMENT_OTHER): Admitting: Physical Therapy

## 2023-08-21 DIAGNOSIS — M25511 Pain in right shoulder: Secondary | ICD-10-CM | POA: Diagnosis not present

## 2023-08-21 DIAGNOSIS — M6281 Muscle weakness (generalized): Secondary | ICD-10-CM

## 2023-08-21 DIAGNOSIS — M25611 Stiffness of right shoulder, not elsewhere classified: Secondary | ICD-10-CM

## 2023-08-21 NOTE — Therapy (Unsigned)
 OUTPATIENT PHYSICAL THERAPY SHOULDER TREATMENT   Patient Name: Wanda Lang MRN: [id number] DOB:[birth year], 73 y.o., female Today's Date: 08/22/2023  END OF SESSION:  PT End of Session - 08/21/23 1120     Visit Number 11    Number of Visits 20    Date for PT Re-Evaluation 09/26/23    Authorization Type human[employer] MCR    PT Start Time 1030    PT Stop Time 1113    PT Time Calculation (min) 43 min    Activity Tolerance Patient tolerated treatment well    Behavior During Therapy WFL for tasks assessed/performed                Past Medical History:  Diagnosis Date   Allergy    Arthritis    Coronary artery disease    Fatty liver    GERD (gastroesophageal reflux disease)    Heart murmur    Hypertension    Hx   LIVER FUNCTION TESTS, ABNORMAL, HX OF 09/08/2007   Qualifier: Diagnosis of  By: Nelson-Smith CMA (AAMA), Dottie     Neuromuscular disorder (HCC)    Essential Tremor   Overactive bladder    Past Surgical History:  Procedure Laterality Date   ABDOMINAL HYSTERECTOMY     COLONOSCOPY WITH PROPOFOL  N/A 09/04/2018   Procedure: COLONOSCOPY WITH PROPOFOL ;  Surgeon: Janalyn Keene NOVAK, MD;  Location: [facility];  Service: Endoscopy;  Laterality: N/A;   ESOPHAGOGASTRODUODENOSCOPY (EGD) WITH PROPOFOL  N/A 09/04/2018   Procedure: ESOPHAGOGASTRODUODENOSCOPY (EGD) WITH PROPOFOL ;  Surgeon: Janalyn Keene NOVAK, MD;  Location: [facility];  Service: Endoscopy;  Laterality: N/A;   LAPAROSCOPY     for endometriosis x2   MOUTH SURGERY  04/2023   tooth extraction, gum graft   TONSILLECTOMY     TOTAL HIP ARTHROPLASTY Right 05/15/2022   Procedure: RIGHT TOTAL HIP ARTHROPLASTY ANTERIOR APPROACH;  Surgeon: Vernetta Lonni GRADE, MD;  Location: [facility];  Service: Orthopedics;  Laterality: Right;   TOTAL KNEE ARTHROPLASTY Left 07/18/2021   Procedure: TOTAL KNEE ARTHROPLASTY;  Surgeon: Ernie Cough, MD;  Location: [facility];  Service: Orthopedics;  Laterality: Left;   TURBINATE  REDUCTION     Patient Active Problem List   Diagnosis Date Noted   Protracted URI 05/03/2023   Acute non-recurrent pansinusitis 02/04/2023   OSA (obstructive sleep apnea) 11/14/2022   Arthritis 11/14/2022   Nocturia 10/05/2022   Hypersomnia 10/05/2022   S/P total hip arthroplasty 05/17/2022   Status post total replacement of right hip 05/15/2022   Overactive bladder 04/04/2022   Postmenopausal estrogen deficiency 11/13/2021   Primary osteoarthritis 11/13/2021   Leg length inequality 11/06/2021   Impaired ambulation 10/23/2021   Contact dermatitis 10/16/2021   SI joint arthritis (HCC) 08/24/2021   Spinal stenosis, lumbar region, without neurogenic claudication 08/24/2021   Degeneration of lumbar intervertebral disc 07/24/2021   S/P total knee arthroplasty, left 07/18/2021   Preop examination 07/07/2021   Right hip pain 07/07/2021   Low back pain 07/07/2021   Drug-induced immunodeficiency (HCC) 06/12/2021   Impingement syndrome of right shoulder region 06/12/2021   COVID-19 01/24/2021   Heart murmur 11/24/2020   Hammer toe 04/22/2020   Metatarsalgia of left foot 04/22/2020   Osteoarthritis of midfoot 04/22/2020   Duodenal ulcer 11/12/2019   Stress 11/12/2019   Morbid obesity (HCC) 11/06/2019   Pain in left knee 07/30/2019   Aortic stenosis, moderate 02/06/2018   Chronic diarrhea 10/09/2017   Acid reflux 07/15/2017   Asymptomatic carotid artery stenosis 07/15/2017   Cyst of  skin 07/15/2017   Right maxillary sinusitis 11/09/2016   Immunocompromised (HCC) 11/09/2016   Allergic rhinitis 11/02/2016   Benign essential tremor 11/02/2016   History of hypertension 11/02/2016   Postmenopausal symptoms 07/01/2015   Fatty liver 09/08/2007   Rheumatoid arthritis (HCC) 09/08/2007     REFERRING PROVIDER: Carilyn Prentice BRAVO, MD  REFERRING DIAG: [id number] (ICD-10-CM) - Chronic right shoulder pain  THERAPY DIAG:  Right shoulder pain, unspecified chronicity  Stiffness of  right shoulder, not elsewhere classified  Muscle weakness (generalized)  Rationale for Evaluation and Treatment: Rehabilitation  ONSET DATE: Late 2024/January 2025  SUBJECTIVE:                                                                                                                                                                                      SUBJECTIVE STATEMENT:  It's definitely better, much better.  I have less pain than I did.  Pt reports improved shoulder mobility and activities at home.  Pt reports she is able to chop an apple.  Pt has improved performance of washing dishes.  She still has difficulty with washing face and dressing though is easier now.  Pt is limited with ADLs/IADLs and compensates just using her L UE.   Pt reports a 35-40% improvement in performance of ADL's/self care activities.  Pt unable to reach into a kitchen cabinet.  Pt unable to brush hair with R UE though is brushing her teeth with R UE.  Pt is able to set a table.     Pt reports she over did it helping with her church breakfast this weekend.  It takes time for her to recover.  Pt sees Dr. Genelle next week and is scheduled for a lumbar ESI on 7/22.  Pt states she had increased pain after prior Rx though recovered much quicker.        Hand dominance: Right  PERTINENT HISTORY: Chronic LBP, multilevel DDD, SI pain R THA 04/24 L TKA 06/23 RA Right Knee End-stage OA  Bladder incontinence    PAIN:  Location: R lateral and post shoulder NPRS:  2/10 current, 7/10 worst, 0/10 best   PRECAUTIONS: RA, R THA, L TKA  RED FLAGS: Bowel or bladder incontinence: Yes:     WEIGHT BEARING RESTRICTIONS: No  FALLS:  Has patient fallen in last 6 months? No  LIVING ENVIRONMENT: Lives with: lives alone Lives in: 2 story home though mainly lives on the 1st floor Stairs: yes Has following equipment at home: chair lift on stairs, walking stick, FWW  OCCUPATION: retired  PLOF:  Independent  PATIENT GOALS:to get rid of pain in shoulder and use her R arm.  NEXT  MD VISIT:   OBJECTIVE:  Note: Objective measures were completed at Evaluation unless otherwise noted.  DIAGNOSTIC FINDINGS:  R shoulder x ray:   FINDINGS: There is diffuse decreased bone mineralization. Severe glenohumeral joint space narrowing with bone-on-bone contact, subchondral sclerosis, subchondral cystic change, and moderate superomedial humeral head and glenoid fossa cortical flattening/remodeling.   Mild acromioclavicular joint space narrowing and peripheral osteophytosis.   No acute fracture or dislocation.   The visualized portion of the right lung is unremarkable.   IMPRESSION: 1. Severe glenohumeral osteoarthritis. 2. Mild acromioclavicular osteoarthritis.   PATIENT SURVEYS:  UEFI: Initial/Current: 20/80  /  31/80  COGNITION: Overall cognitive status: Within functional limits for tasks assessed      UPPER EXTREMITY ROM:   AROM/PROM Right eval Left eval Right 7/16  Shoulder flexion 62 with pain 152 78 with tightness and little pain  AAROM: (supine) 112  PROM:  116  Shoulder scaption 52 with pain 155 72 with pain  Shoulder abduction 38 with pain 138 64 with some pain  Shoulder adduction     Shoulder internal rotation 47/44    Shoulder external rotation 24/22   16/18  Elbow flexion     Elbow extension     Wrist flexion     Wrist extension     Wrist ulnar deviation     Wrist radial deviation     Wrist pronation     Wrist supination     (Blank rows = not tested)  UPPER EXTREMITY MMT:  MMT Right eval Left eval  Shoulder flexion  5/5  Shoulder extension    Shoulder abduction  4+/5  Shoulder adduction    Shoulder internal rotation Tolerated min to mod resistance with pain WFL in sitting  Shoulder external rotation Tolerated min resistance with pain Tolerated mod resistance in sitting  Middle trapezius    Lower trapezius    Elbow flexion    Elbow  extension    Wrist flexion    Wrist extension    Wrist ulnar deviation    Wrist radial deviation    Wrist pronation    Wrist supination    Grip strength (lbs)    (Blank rows = not tested)  SHOULDER SPECIAL TESTS: ER lag:  negative with arm at side                                                                                                                             TREATMENT:   08/21/2023 Pulleys in flexion approx 20 reps, scaption x 10 reps Standing ball rolls at table- flexion 2 x10 (green physioball), x10 scaption Supine serratus punches 3x10 Standing shoulder flexion in front of mirror (partial range) x10- cues for decreasing shoulder hike Standing forward punches x 10 Standing row GTB 2 x 15  See above for ROM and UEFI.    08/19/2023 R shoulder PROM Rhythmic stabilization IR/ER at 45deg ab- 1/2 roll under arm (gentle pressure) Supine well arm assisted flexion  x 10 reps Supine wand flexion x 10 reps Supine serratus punch  2x10 reps Standing ball rolls at table- flexion 2 x10 (green physioball), 2x10 scaption ER/IR/ext/flex isometrics with 5 sec hold x 10 reps each Standing row GTB 2 x 15 Standing shoulder extension GTB 2 x 15 Standing shoulder flexion in front of mirror (partial range) 2x10- cues for decreasing shoulder hike  Shoulder extension with cane behind back 2x10  08/16/2023 R shoulder PROM Supine well arm assisted flexion x 10 reps Supine wand flexion x 10 reps Supine serratus punch  2x10 reps Standing ball rolls at table- flexion 2 x10 (green physioball), 1x10 scaption ER/IR/ext/flex isometrics with 5 sec hold x 10 reps each Standing row GTB 2 x 10 Standing shoulder extension GTB 2 x 10  Standing shoulder flexion in front of mirror (partial range) 2x10- cues for decreasing shoulder hike Standing short arc abduction bil 2x10 Shoulder extension with cane behind back 2x10    PATIENT EDUCATION: Education details: dx, HEP, POC, goal progress,  objective findings, exercise form, rationale of interventions, and rationale of interventions.   Person educated: Patient Education method: Explanation, Demonstration, Tactile cues, Verbal cues, and Handouts Education comprehension: verbalized understanding, returned demonstration, verbal cues required, tactile cues required, and needs further education  HOME EXERCISE PROGRAM: Access Code: [email or web address] URL: [email or web address] Date: 07/11/2023 Prepared by: Mose Minerva  Exercises - Seated Scapular Retraction  - 2 x daily - 7 x weekly - 2 sets - 10 reps - 3 second hold - Seated Shoulder Flexion Towel Slide at Table Top  - 2 x daily - 7 x weekly - 2 sets - 10 reps  ASSESSMENT:  CLINICAL IMPRESSION:  Pt continues to have limitations with ADL's/IADLs though reports improvement.  Pt is limited with ADLs/IADLs and compensates just using her L UE.  She reports a 35-40% improvement in performance of ADL's/self care activities.  She is very limited with reaching and unable to perform overhead activities with R UE.  Pt is unable to reach into a kitchen cabinet.  Pt is unable to brush hair with R UE though is brushing her teeth with R UE. Pt has improved with elevation AROM though continues to have significant limitations.  She is unable to reach overhead.  Pt had worse ER AROM.  She is very limited with ROM with seated pulleys.  Pt demonstrates clinically significant improvement in self perceived disability with UEFI improving from 20/80 to 31/80.  Pt has met STG's #1,3.  Pt sees ortho surgeon next week.  Pt may benefit from cont skilled PT to address ongoing goals, improve strength and ROM, and maximize functional usage of R UE.    OBJECTIVE IMPAIRMENTS: decreased activity tolerance, decreased ROM, decreased strength, hypomobility, impaired flexibility, impaired UE functional use, and pain.   ACTIVITY LIMITATIONS: bathing, dressing, reach over head, and hygiene/grooming  PARTICIPATION  LIMITATIONS: meal prep, cleaning, and laundry  PERSONAL FACTORS: 1-2 comorbidities: RA and chronic back pain are also affecting patient's functional outcome.   REHAB POTENTIAL: Good  CLINICAL DECISION MAKING: Stable/uncomplicated  EVALUATION COMPLEXITY: Low   GOALS:  SHORT TERM GOALS: Target date:  07/31/23   Pt will be independent and compliant with HEP for improved pain, ROM, strength, and function.  Baseline: Goal status: GOAL MET  2.  Pt will demo at least a 30 deg increase in flexion, scaption, and abd AROM for improved shoulder mobility and stiffness.  Baseline:  Goal status: PROGRESSING  7/16  3.  Pt will report at least a  25% improvement in performance of ADL's/self care activities.  Baseline:  Goal status: GOAL MET  7/16  4.  Pt will be able to wash and dry her hair without significant difficulty.  Baseline:  Goal status: NOT MET Target date:  08/07/2023    LONG TERM GOALS: Target date:  09/26/23  Pt will be able to reach overhead into a cabinet without significant pain and difficulty.  Baseline:  Goal status: NOT MET  7/16  2.  Pt will be able to perform her ADLs and IADLs without significant difficulty and pain. Baseline:  Goal status: PROGRESSING  3.  Pt will demo improved R shoulder AROM to at least 130 deg in flexion and 50 deg in ER for improved reaching and performance of ADLs.  Baseline:  Goal status: NOT MET  4.  Pt will tolerate strengthening and stability exercises without adverse effects to improve R glenohumeral and scapular strength and stability to perform reaching activities, household chores, and functional lifting activities.   Baseline:  Goal status: ONGOING    PLAN:  PT FREQUENCY: 2x/week  PT DURATION:  4-5 weeks  PLANNED INTERVENTIONS: 97164- PT Re-evaluation, 97750- Physical Performance Testing, 97110-Therapeutic exercises, 97530- Therapeutic activity, [id number]- Neuromuscular re-education, 97535- Self Care, [id number]- Manual therapy,  [id number]- Electrical stimulation (unattended), [id number]- Electrical stimulation (manual), [id number]- Ultrasound, Patient/Family education, Taping, Cryotherapy, and Moist heat  PLAN FOR NEXT SESSION: Cont with shoulder ROM, strengthening, and stability per pt tolerance.  Review and perform HEP.     Leigh Minerva III PT, DPT 08/22/23 10:01 PM    Referring diagnosis? M25.511,G89.29 (ICD-10-CM) - Chronic right shoulder pain   Treatment diagnosis? (if different than referring diagnosis)  Right shoulder pain, unspecified chronicity   Stiffness of right shoulder, not elsewhere classified   Muscle weakness (generalized)   What was this (referring dx) caused by? []  Surgery []  Fall []  Ongoing issue []  Arthritis []  Other: Pt has no specific MOI, but does have arthritis   Laterality: [x]  Rt []  Lt []  Both   Check all possible CPT codes:             *CHOOSE 10 OR LESS*                          See Planned Interventions listed in the Plan section of the Evaluation.

## 2023-08-23 DIAGNOSIS — M0589 Other rheumatoid arthritis with rheumatoid factor of multiple sites: Secondary | ICD-10-CM | POA: Diagnosis not present

## 2023-08-26 ENCOUNTER — Other Ambulatory Visit: Payer: Self-pay | Admitting: Nurse Practitioner

## 2023-08-26 ENCOUNTER — Encounter (HOSPITAL_BASED_OUTPATIENT_CLINIC_OR_DEPARTMENT_OTHER): Payer: Self-pay | Admitting: Physical Therapy

## 2023-08-26 ENCOUNTER — Ambulatory Visit (HOSPITAL_BASED_OUTPATIENT_CLINIC_OR_DEPARTMENT_OTHER): Admitting: Physical Therapy

## 2023-08-26 DIAGNOSIS — K8020 Calculus of gallbladder without cholecystitis without obstruction: Secondary | ICD-10-CM

## 2023-08-26 DIAGNOSIS — M25511 Pain in right shoulder: Secondary | ICD-10-CM | POA: Diagnosis not present

## 2023-08-26 DIAGNOSIS — M6281 Muscle weakness (generalized): Secondary | ICD-10-CM | POA: Diagnosis not present

## 2023-08-26 DIAGNOSIS — M25611 Stiffness of right shoulder, not elsewhere classified: Secondary | ICD-10-CM | POA: Diagnosis not present

## 2023-08-26 NOTE — Therapy (Signed)
 OUTPATIENT PHYSICAL THERAPY SHOULDER TREATMENT   Patient Name: Wanda Lang MRN: [id number] DOB:August 02, 1950, 73 y.o., female Today's Date: 08/26/2023  END OF SESSION:  PT End of Session - 08/26/23 1133     Visit Number 12    Number of Visits 20    Date for PT Re-Evaluation 09/26/23    Authorization Type humana MCR    PT Start Time 1116    PT Stop Time 1156    PT Time Calculation (min) 40 min    Activity Tolerance Patient tolerated treatment well    Behavior During Therapy WFL for tasks assessed/performed                 Past Medical History:  Diagnosis Date   Allergy    Arthritis    Coronary artery disease    Fatty liver    GERD (gastroesophageal reflux disease)    Heart murmur    Hypertension    Hx   LIVER FUNCTION TESTS, ABNORMAL, HX OF 09/08/2007   Qualifier: Diagnosis of  By: Nelson-Smith CMA (AAMA), Dottie     Neuromuscular disorder (HCC)    Essential Tremor   Overactive bladder    Past Surgical History:  Procedure Laterality Date   ABDOMINAL HYSTERECTOMY     COLONOSCOPY WITH PROPOFOL  N/A 09/04/2018   Procedure: COLONOSCOPY WITH PROPOFOL ;  Surgeon: Janalyn Keene NOVAK, MD;  Location: [facility];  Service: Endoscopy;  Laterality: N/A;   ESOPHAGOGASTRODUODENOSCOPY (EGD) WITH PROPOFOL  N/A 09/04/2018   Procedure: ESOPHAGOGASTRODUODENOSCOPY (EGD) WITH PROPOFOL ;  Surgeon: Janalyn Keene NOVAK, MD;  Location: [facility];  Service: Endoscopy;  Laterality: N/A;   LAPAROSCOPY     for endometriosis x2   MOUTH SURGERY  04/2023   tooth extraction, gum graft   TONSILLECTOMY     TOTAL HIP ARTHROPLASTY Right 05/15/2022   Procedure: RIGHT TOTAL HIP ARTHROPLASTY ANTERIOR APPROACH;  Surgeon: Vernetta Lonni GRADE, MD;  Location: [facility];  Service: Orthopedics;  Laterality: Right;   TOTAL KNEE ARTHROPLASTY Left 07/18/2021   Procedure: TOTAL KNEE ARTHROPLASTY;  Surgeon: Ernie Cough, MD;  Location: [facility];  Service: Orthopedics;  Laterality: Left;    TURBINATE REDUCTION     Patient Active Problem List   Diagnosis Date Noted   Protracted URI 05/03/2023   Acute non-recurrent pansinusitis 02/04/2023   OSA (obstructive sleep apnea) 11/14/2022   Arthritis 11/14/2022   Nocturia 10/05/2022   Hypersomnia 10/05/2022   S/P total hip arthroplasty 05/17/2022   Status post total replacement of right hip 05/15/2022   Overactive bladder 04/04/2022   Postmenopausal estrogen deficiency 11/13/2021   Primary osteoarthritis 11/13/2021   Leg length inequality 11/06/2021   Impaired ambulation 10/23/2021   Contact dermatitis 10/16/2021   SI joint arthritis (HCC) 08/24/2021   Spinal stenosis, lumbar region, without neurogenic claudication 08/24/2021   Degeneration of lumbar intervertebral disc 07/24/2021   S/P total knee arthroplasty, left 07/18/2021   Preop examination 07/07/2021   Right hip pain 07/07/2021   Low back pain 07/07/2021   Drug-induced immunodeficiency (HCC) 06/12/2021   Impingement syndrome of right shoulder region 06/12/2021   COVID-19 01/24/2021   Heart murmur 11/24/2020   Hammer toe 04/22/2020   Metatarsalgia of left foot 04/22/2020   Osteoarthritis of midfoot 04/22/2020   Duodenal ulcer 11/12/2019   Stress 11/12/2019   Morbid obesity (HCC) 11/06/2019   Pain in left knee 07/30/2019   Aortic stenosis, moderate 02/06/2018   Chronic diarrhea 10/09/2017   Acid reflux 07/15/2017   Asymptomatic carotid artery stenosis 07/15/2017   Cyst  of skin 07/15/2017   Right maxillary sinusitis 11/09/2016   Immunocompromised (HCC) 11/09/2016   Allergic rhinitis 11/02/2016   Benign essential tremor 11/02/2016   History of hypertension 11/02/2016   Postmenopausal symptoms 07/01/2015   Fatty liver 09/08/2007   Rheumatoid arthritis (HCC) 09/08/2007     REFERRING PROVIDER: Carilyn Prentice BRAVO, MD  REFERRING DIAG: [phone] (ICD-10-CM) - Chronic right shoulder pain  THERAPY DIAG:  Right shoulder pain, unspecified  chronicity  Stiffness of right shoulder, not elsewhere classified  Muscle weakness (generalized)  Rationale for Evaluation and Treatment: Rehabilitation  ONSET DATE: Late 2024/January 2025  SUBJECTIVE:                                                                                                                                                                                      SUBJECTIVE STATEMENT:  Pt states she is not moving around as good today.  Pt reports she hasn't taken her anti-inflammatories over the past 4 days due to having a lumbar ESI tomorrow.  Pt had pain lying in bed.  Pt had pain in shoulder with her normal position in sleeping last night.  She used a lidocaine  patch on her shoulder last night which helped.   Pt sees Dr. Genelle on Wednesday.         Hand dominance: Right  PERTINENT HISTORY: Chronic LBP, multilevel DDD, SI pain R THA 04/24 L TKA 06/23 RA Right Knee End-stage OA  Bladder incontinence    PAIN:  Location: R post shoulder NPRS:  4-5/10 current, 7/10 worst, 0/10 best   PRECAUTIONS: RA, R THA, L TKA  RED FLAGS: Bowel or bladder incontinence: Yes:     WEIGHT BEARING RESTRICTIONS: No  FALLS:  Has patient fallen in last 6 months? No  LIVING ENVIRONMENT: Lives with: lives alone Lives in: 2 story home though mainly lives on the 1st floor Stairs: yes Has following equipment at home: chair lift on stairs, walking stick, FWW  OCCUPATION: retired  PLOF: Independent  PATIENT GOALS:to get rid of pain in shoulder and use her R arm.  NEXT MD VISIT:   OBJECTIVE:  Note: Objective measures were completed at Evaluation unless otherwise noted.  DIAGNOSTIC FINDINGS:  R shoulder x ray:   FINDINGS: There is diffuse decreased bone mineralization. Severe glenohumeral joint space narrowing with bone-on-bone contact, subchondral sclerosis, subchondral cystic change, and moderate superomedial humeral head and glenoid fossa cortical  flattening/remodeling.   Mild acromioclavicular joint space narrowing and peripheral osteophytosis.   No acute fracture or dislocation.   The visualized portion of the right lung is unremarkable.   IMPRESSION: 1. Severe glenohumeral osteoarthritis. 2. Mild acromioclavicular osteoarthritis.   PATIENT  SURVEYS:  UEFI: Initial/Current: 20/80  /  31/80  COGNITION: Overall cognitive status: Within functional limits for tasks assessed      UPPER EXTREMITY ROM:   AROM/PROM Right eval Left eval Right 7/16  Shoulder flexion 62 with pain 152 78 with tightness and little pain  AAROM: (supine) 112  PROM:  116  Shoulder scaption 52 with pain 155 72 with pain  Shoulder abduction 38 with pain 138 64 with some pain  Shoulder adduction     Shoulder internal rotation 47/44    Shoulder external rotation 24/22   16/18  Elbow flexion     Elbow extension     Wrist flexion     Wrist extension     Wrist ulnar deviation     Wrist radial deviation     Wrist pronation     Wrist supination     (Blank rows = not tested)  UPPER EXTREMITY MMT:  MMT Right eval Left eval  Shoulder flexion  5/5  Shoulder extension    Shoulder abduction  4+/5  Shoulder adduction    Shoulder internal rotation Tolerated min to mod resistance with pain WFL in sitting  Shoulder external rotation Tolerated min resistance with pain Tolerated mod resistance in sitting  Middle trapezius    Lower trapezius    Elbow flexion    Elbow extension    Wrist flexion    Wrist extension    Wrist ulnar deviation    Wrist radial deviation    Wrist pronation    Wrist supination    Grip strength (lbs)    (Blank rows = not tested)  SHOULDER SPECIAL TESTS: ER lag:  negative with arm at side                                                                                                                             TREATMENT:   08/26/23 Pulleys in flexion approx 15 and 10 reps, attempted scaption Standing ball  rolls at table (green physioball) 2x10 in flexion and scaption Standing shoulder flexion in front of mirror (partial range) x10 Standing forward punches 2x10 Supine serratus punches 3x10 Supine ER AROM 2x10 Submaximal shoulder isometrics in standing with 5 sec hold in flexion, abd, and ER  Pt received R shoulder PROM in flexion, scaption, and ER per pt and tissue tolerance  08/21/2023 Pulleys in flexion approx 20 reps, scaption x 10 reps Standing ball rolls at table- flexion 2 x10 (green physioball), x10 scaption Supine serratus punches 3x10 Standing shoulder flexion in front of mirror (partial range) x10- cues for decreasing shoulder hike Standing forward punches x 10 Standing row GTB 2 x 15  See above for ROM and UEFI.    08/19/2023 R shoulder PROM Rhythmic stabilization IR/ER at 45deg ab- 1/2 roll under arm (gentle pressure) Supine well arm assisted flexion x 10 reps Supine wand flexion x 10 reps Supine serratus punch  2x10 reps Standing ball rolls at table- flexion 2  x10 (green physioball), 2x10 scaption ER/IR/ext/flex isometrics with 5 sec hold x 10 reps each Standing row GTB 2 x 15 Standing shoulder extension GTB 2 x 15 Standing shoulder flexion in front of mirror (partial range) 2x10- cues for decreasing shoulder hike  Shoulder extension with cane behind back 2x10  08/16/2023 R shoulder PROM Supine well arm assisted flexion x 10 reps Supine wand flexion x 10 reps Supine serratus punch  2x10 reps Standing ball rolls at table- flexion 2 x10 (green physioball), 1x10 scaption ER/IR/ext/flex isometrics with 5 sec hold x 10 reps each Standing row GTB 2 x 10 Standing shoulder extension GTB 2 x 10  Standing shoulder flexion in front of mirror (partial range) 2x10- cues for decreasing shoulder hike Standing short arc abduction bil 2x10 Shoulder extension with cane behind back 2x10    PATIENT EDUCATION: Education details: dx, HEP, POC, goal progress, objective findings,  exercise form, rationale of interventions, and rationale of interventions.   Person educated: Patient Education method: Explanation, Demonstration, Tactile cues, Verbal cues, and Handouts Education comprehension: verbalized understanding, returned demonstration, verbal cues required, tactile cues required, and needs further education  HOME EXERCISE PROGRAM: Access Code: [email or web address] URL: [email or web address] Date: 07/11/2023 Prepared by: Mose Minerva  Exercises - Seated Scapular Retraction  - 2 x daily - 7 x weekly - 2 sets - 10 reps - 3 second hold - Seated Shoulder Flexion Towel Slide at Table Top  - 2 x daily - 7 x weekly - 2 sets - 10 reps  ASSESSMENT:  CLINICAL IMPRESSION:  Pt has limitations with ADL's/IADLs and continues to have limited shoulder mobility.  PT attempted pulleys in scaption though pt unable to perform.  Pt was able to perform flexion in pulleys through a limited range of motion.  Pt performed exercises to improve shoulder and scapular strength and shoulder ROM for improved performance of functional activities.  Pt performed standing flexion AROM in front of mirror though is very limited with ROM.  Pt is limited in PROM especially ER.  Pt tolerated treatment well and states she feels better, more loose after Rx.  Pt sees ortho surgeon on Thursday.  Pt may benefit from cont skilled PT to address ongoing goals, improve strength and ROM, and maximize functional usage of R UE.    OBJECTIVE IMPAIRMENTS: decreased activity tolerance, decreased ROM, decreased strength, hypomobility, impaired flexibility, impaired UE functional use, and pain.   ACTIVITY LIMITATIONS: bathing, dressing, reach over head, and hygiene/grooming  PARTICIPATION LIMITATIONS: meal prep, cleaning, and laundry  PERSONAL FACTORS: 1-2 comorbidities: RA and chronic back pain are also affecting patient's functional outcome.   REHAB POTENTIAL: Good  CLINICAL DECISION MAKING:  Stable/uncomplicated  EVALUATION COMPLEXITY: Low   GOALS:  SHORT TERM GOALS: Target date:  07/31/23   Pt will be independent and compliant with HEP for improved pain, ROM, strength, and function.  Baseline: Goal status: GOAL MET  2.  Pt will demo at least a 30 deg increase in flexion, scaption, and abd AROM for improved shoulder mobility and stiffness.  Baseline:  Goal status: PROGRESSING  7/16  3.  Pt will report at least a 25% improvement in performance of ADL's/self care activities.  Baseline:  Goal status: GOAL MET  7/16  4.  Pt will be able to wash and dry her hair without significant difficulty.  Baseline:  Goal status: NOT MET Target date:  08/07/2023    LONG TERM GOALS: Target date:  09/26/23  Pt will be able to reach overhead into a  cabinet without significant pain and difficulty.  Baseline:  Goal status: NOT MET  7/16  2.  Pt will be able to perform her ADLs and IADLs without significant difficulty and pain. Baseline:  Goal status: PROGRESSING  3.  Pt will demo improved R shoulder AROM to at least 130 deg in flexion and 50 deg in ER for improved reaching and performance of ADLs.  Baseline:  Goal status: NOT MET  4.  Pt will tolerate strengthening and stability exercises without adverse effects to improve R glenohumeral and scapular strength and stability to perform reaching activities, household chores, and functional lifting activities.   Baseline:  Goal status: ONGOING    PLAN:  PT FREQUENCY: 2x/week  PT DURATION:  4-5 weeks  PLANNED INTERVENTIONS: 97164- PT Re-evaluation, 97750- Physical Performance Testing, 97110-Therapeutic exercises, 97530- Therapeutic activity, [id number]- Neuromuscular re-education, 97535- Self Care, 02859- Manual therapy, G0283- Electrical stimulation (unattended), [id number]- Electrical stimulation (manual), [id number]- Ultrasound, Patient/Family education, Taping, Cryotherapy, and Moist heat  PLAN FOR NEXT SESSION: Cont with shoulder ROM,  strengthening, and stability per pt tolerance.  Review and perform HEP.     Leigh Minerva III PT, DPT 08/26/23 3:29 PM

## 2023-08-27 DIAGNOSIS — M5414 Radiculopathy, thoracic region: Secondary | ICD-10-CM | POA: Diagnosis not present

## 2023-08-27 DIAGNOSIS — M9901 Segmental and somatic dysfunction of cervical region: Secondary | ICD-10-CM | POA: Diagnosis not present

## 2023-08-27 DIAGNOSIS — M9902 Segmental and somatic dysfunction of thoracic region: Secondary | ICD-10-CM | POA: Diagnosis not present

## 2023-08-27 DIAGNOSIS — M9903 Segmental and somatic dysfunction of lumbar region: Secondary | ICD-10-CM | POA: Diagnosis not present

## 2023-08-27 DIAGNOSIS — M9904 Segmental and somatic dysfunction of sacral region: Secondary | ICD-10-CM | POA: Diagnosis not present

## 2023-08-28 ENCOUNTER — Ambulatory Visit (HOSPITAL_BASED_OUTPATIENT_CLINIC_OR_DEPARTMENT_OTHER): Payer: Self-pay | Admitting: Orthopaedic Surgery

## 2023-08-28 ENCOUNTER — Encounter (HOSPITAL_BASED_OUTPATIENT_CLINIC_OR_DEPARTMENT_OTHER): Admitting: Physical Therapy

## 2023-08-28 ENCOUNTER — Other Ambulatory Visit (HOSPITAL_BASED_OUTPATIENT_CLINIC_OR_DEPARTMENT_OTHER): Payer: Self-pay

## 2023-08-28 ENCOUNTER — Ambulatory Visit (HOSPITAL_BASED_OUTPATIENT_CLINIC_OR_DEPARTMENT_OTHER): Admitting: Orthopaedic Surgery

## 2023-08-28 DIAGNOSIS — M19011 Primary osteoarthritis, right shoulder: Secondary | ICD-10-CM

## 2023-08-28 MED ORDER — ACETAMINOPHEN 500 MG PO TABS
500.0000 mg | ORAL_TABLET | Freq: Three times a day (TID) | ORAL | 0 refills | Status: AC
  Filled 2023-08-28: qty 30, 10d supply, fill #0

## 2023-08-28 MED ORDER — ASPIRIN 325 MG PO TBEC
325.0000 mg | DELAYED_RELEASE_TABLET | Freq: Every day | ORAL | 0 refills | Status: DC
  Filled 2023-08-28: qty 14, 14d supply, fill #0

## 2023-08-28 MED ORDER — OXYCODONE HCL 5 MG PO TABS
5.0000 mg | ORAL_TABLET | ORAL | 0 refills | Status: DC | PRN
  Filled 2023-08-28: qty 10, 2d supply, fill #0

## 2023-08-28 MED ORDER — IBUPROFEN 800 MG PO TABS
800.0000 mg | ORAL_TABLET | Freq: Three times a day (TID) | ORAL | 0 refills | Status: AC
  Filled 2023-08-28: qty 30, 10d supply, fill #0

## 2023-08-28 NOTE — Progress Notes (Signed)
 Chief Complaint: Right shoulder pain     History of Present Illness:    Wanda Lang is a 73 y.o. female right dominant female presents with ongoing right shoulder pain and severe limited motion.  She does have a history of right hip osteoarthritis as well as arthroplasty.  She has been undergoing physical therapy with Dr. Henretta as well as had an injection in the past with limited relief.  She is here today for further discussion.  This is her dominant arm and she is having difficulty with active range of motion or ADLs including doing her hair.    PMH/PSH/Family History/Social History/Meds/Allergies:    Past Medical History:  Diagnosis Date   Allergy    Arthritis    Coronary artery disease    Fatty liver    GERD (gastroesophageal reflux disease)    Heart murmur    Hypertension    Hx   LIVER FUNCTION TESTS, ABNORMAL, HX OF 09/08/2007   Qualifier: Diagnosis of  By: Nelson-Smith CMA (AAMA), Dottie     Neuromuscular disorder [facility])    Essential Tremor   Overactive bladder    Past Surgical History:  Procedure Laterality Date   ABDOMINAL HYSTERECTOMY     COLONOSCOPY WITH PROPOFOL  N/A 09/04/2018   Procedure: COLONOSCOPY WITH PROPOFOL ;  Surgeon: Janalyn Keene NOVAK, MD;  Location: [facility];  Service: Endoscopy;  Laterality: N/A;   ESOPHAGOGASTRODUODENOSCOPY (EGD) WITH PROPOFOL  N/A 09/04/2018   Procedure: ESOPHAGOGASTRODUODENOSCOPY (EGD) WITH PROPOFOL ;  Surgeon: Janalyn Keene NOVAK, MD;  Location: [facility];  Service: Endoscopy;  Laterality: N/A;   LAPAROSCOPY     for endometriosis x2   MOUTH SURGERY  04/2023   tooth extraction, gum graft   TONSILLECTOMY     TOTAL HIP ARTHROPLASTY Right 05/15/2022   Procedure: RIGHT TOTAL HIP ARTHROPLASTY ANTERIOR APPROACH;  Surgeon: Vernetta Lonni GRADE, MD;  Location: [facility];  Service: Orthopedics;  Laterality: Right;   TOTAL KNEE ARTHROPLASTY Left 07/18/2021   Procedure: TOTAL KNEE ARTHROPLASTY;  Surgeon: Ernie Cough, MD;  Location: [facility];  Service: Orthopedics;  Laterality: Left;   TURBINATE REDUCTION     Social History   Socioeconomic History   Marital status: Widowed    Spouse name: Not on file   Number of children: Not on file   Years of education: Not on file   Highest education level: Bachelor's degree (e.g., BA, AB, BS)  Occupational History   Not on file  Tobacco Use   Smoking status: Never    Passive exposure: Never   Smokeless tobacco: Never  Vaping Use   Vaping status: Never Used  Substance and Sexual Activity   Alcohol use: Not Currently    Alcohol/week: 1.0 standard drink of alcohol    Types: 1 Glasses of wine per week   Drug use: No   Sexual activity: Not Currently  Other Topics Concern   Not on file  Social History Narrative   Not on file   Social Drivers of Health   Financial Resource Strain: Low Risk  (05/02/2023)   Overall Financial Resource Strain (CARDIA)    Difficulty of Paying Living Expenses: Not hard at all  Food Insecurity: No Food Insecurity (05/02/2023)   Hunger Vital Sign    Worried About Running Out of Food in the Last Year: Never true    Ran Out of Food in the Last Year: Never true  Transportation Needs: No Transportation Needs (05/02/2023)   PRAPARE - Administrator, Civil Service (Medical):  No    Lack of Transportation (Non-Medical): No  Physical Activity: Inactive (05/02/2023)   Exercise Vital Sign    Days of Exercise per Week: 0 days    Minutes of Exercise per Session: 20 min  Stress: No Stress Concern Present (05/02/2023)   [employer] of Occupational Health - Occupational Stress Questionnaire    Feeling of Stress : Only a little  Social Connections: Moderately Integrated (05/02/2023)   Social Connection and Isolation Panel    Frequency of Communication with Friends and Family: More than three times a week    Frequency of Social Gatherings with Friends and Family: More than three times a week    Attends Religious  Services: More than 4 times per year    Active Member of [employer] or Organizations: Yes    Attends Engineer, structural: More than 4 times per year    Marital Status: Divorced   Family History  Problem Relation Age of Onset   Alcohol abuse Mother    Arthritis Mother    Hyperlipidemia Mother    Heart disease Mother    Stroke Mother    Hypertension Mother    Depression Mother    Anxiety disorder Mother    Arthritis Father    Lung cancer Paternal Grandfather    Kidney disease Paternal Grandfather    Allergies  Allergen Reactions   Penicillins Itching    Tolerated Cephalosporin 07/18/21.   Other reaction(s): wheezing   Amoxicillin Itching   Misc. Sulfonamide Containing Compounds    Rosanil Cleanser [Sulfacetamide Sodium-Sulfur] Other (See Comments)    Unknown reaction    Sulfonamide Derivatives     Unknown reaction    Ylang-Ylang [[place]anga Oil (Ylang-Ylang)] Itching   Current Outpatient Medications  Medication Sig Dispense Refill   acetaminophen  (TYLENOL ) 500 MG tablet Take 1 tablet (500 mg total) by mouth every 8 (eight) hours for 10 days. 30 tablet 0   aspirin  EC 325 MG tablet Take 1 tablet (325 mg total) by mouth daily. 14 tablet 0   ibuprofen  (ADVIL ) 800 MG tablet Take 1 tablet (800 mg total) by mouth every 8 (eight) hours for 10 days. Please take with food, please alternate with acetaminophen  30 tablet 0   oxyCODONE  (ROXICODONE ) 5 MG immediate release tablet Take 1 tablet (5 mg total) by mouth every 4 (four) hours as needed for severe pain (pain score 7-10) or breakthrough pain. 10 tablet 0   Abatacept  (ORENCIA  IV) Inject into the vein.     acetaminophen  (TYLENOL ) 500 MG tablet Take 1,000 mg by mouth every 6 (six) hours as needed for moderate pain.     b complex vitamins capsule Take 1 capsule by mouth daily.     CALCIUM  PO Take 1,200 mg by mouth daily.     celecoxib  (CELEBREX ) 200 MG capsule Take 200 mg by mouth daily.     cycloSPORINE  (RESTASIS ) 0.05 % ophthalmic  emulsion Place 1 drop into both eyes 2 (two) times daily.     Glucosamine HCl 1000 MG TABS Take 1,000 mg by mouth daily.     lidocaine  (LIDODERM ) 5 % Place 1 patch onto the skin daily.     Magnesium  100 MG TABS Take 100 mg by mouth 2 (two) times a week.     Multiple Vitamin (MULTIVITAMIN WITH MINERALS) TABS tablet Take 1 tablet by mouth daily.     oxyCODONE  (OXY IR/ROXICODONE ) 5 MG immediate release tablet TAKE 1 TABLET BY MOUTH FOUR TIMES DAILY AS NEEDED FOR SEVERE PAIN.  Probiotic Product (PROBIOTIC DAILY PO) Take 1 Dose by mouth daily.     trolamine salicylate (BLUE-EMU HEMP) 10 % cream Apply 1 Application topically as needed for muscle pain.     VAGIFEM 10 MCG TABS vaginal tablet      Vitamin D , Ergocalciferol , (DRISDOL) 1.25 MG (50000 UNIT) CAPS capsule Take 50,000 Units by mouth every 14 (fourteen) days.     No current facility-administered medications for this visit.   No results found.  Review of Systems:   A ROS was performed including pertinent positives and negatives as documented in the HPI.  Physical Exam :   Constitutional: NAD and appears stated age Neurological: Alert and oriented Psych: Appropriate affect and cooperative There were no vitals taken for this visit.   Comprehensive Musculoskeletal Exam:    Right shoulder with tenderness about the glenohumeral joint.  There is severe crepitus.  Active forward elevation is to approximately 20 degrees with external rotation at the side to neutral.  Internal rotation is to sacrum this neurosensory seems intact   Imaging:   Xray (3 views right shoulder): Severe glenohumeral osteoarthritis    I personally reviewed and interpreted the radiographs.   Assessment and Plan:   73 y.o. female with right shoulder severe glenohumeral osteoarthritis.  Today's visit she has trialed both physical therapy and injection without relief.  Given this we did discuss treatment options.  I did discuss the possibility of reverse shoulder  arthroplasty.  I discussed the risk limitations also associated recovery timeframe.  After discussion she would like to proceed with this - Plan for right shoulder reverse shoulder arthroplasty   After a lengthy discussion of treatment options, including risks, benefits, alternatives, complications of surgical and nonsurgical conservative options, the patient elected surgical repair.   The patient  is aware of the material risks  and complications including, but not limited to injury to adjacent structures, neurovascular injury, infection, numbness, bleeding, implant failure, thermal burns, stiffness, persistent pain, failure to heal, disease transmission from allograft, need for further surgery, dislocation, anesthetic risks, blood clots, risks of death,and others. The probabilities of surgical success and failure discussed with patient given their particular co-morbidities.The time and nature of expected rehabilitation and recovery was discussed.The patient's questions were all answered preoperatively.  No barriers to understanding were noted. I explained the natural history of the disease process and Rx rationale.  I explained to the patient what I considered to be reasonable expectations given their personal situation.  The final treatment plan was arrived at through a shared patient decision making process model.     I personally saw and evaluated the patient, and participated in the management and treatment plan.  Elspeth Parker, MD Attending Physician, Orthopedic Surgery  This document was dictated using Dragon voice recognition software. A reasonable attempt at proof reading has been made to minimize errors.

## 2023-08-29 ENCOUNTER — Encounter (HOSPITAL_BASED_OUTPATIENT_CLINIC_OR_DEPARTMENT_OTHER): Payer: Self-pay

## 2023-08-29 ENCOUNTER — Other Ambulatory Visit (HOSPITAL_BASED_OUTPATIENT_CLINIC_OR_DEPARTMENT_OTHER): Payer: Self-pay | Admitting: Orthopaedic Surgery

## 2023-08-29 DIAGNOSIS — M19011 Primary osteoarthritis, right shoulder: Secondary | ICD-10-CM

## 2023-08-30 ENCOUNTER — Telehealth: Payer: Self-pay | Admitting: Nurse Practitioner

## 2023-08-30 ENCOUNTER — Encounter (HOSPITAL_BASED_OUTPATIENT_CLINIC_OR_DEPARTMENT_OTHER): Payer: Self-pay | Admitting: Physical Therapy

## 2023-08-30 ENCOUNTER — Ambulatory Visit (HOSPITAL_BASED_OUTPATIENT_CLINIC_OR_DEPARTMENT_OTHER): Admitting: Physical Therapy

## 2023-08-30 ENCOUNTER — Encounter (HOSPITAL_BASED_OUTPATIENT_CLINIC_OR_DEPARTMENT_OTHER): Payer: Self-pay | Admitting: Orthopaedic Surgery

## 2023-08-30 DIAGNOSIS — M25511 Pain in right shoulder: Secondary | ICD-10-CM

## 2023-08-30 DIAGNOSIS — M6281 Muscle weakness (generalized): Secondary | ICD-10-CM | POA: Diagnosis not present

## 2023-08-30 DIAGNOSIS — M25611 Stiffness of right shoulder, not elsewhere classified: Secondary | ICD-10-CM | POA: Diagnosis not present

## 2023-08-30 NOTE — Telephone Encounter (Signed)
 Copied from CRM #[id number]. Topic: Appointments - Scheduling Inquiry for Clinic >> Aug 29, 2023  4:59 PM Abigail D wrote: Reason for CRM: Patient is requesting a visit for surgical clearance for her surgery, however, she does not have the surgery scheduled yet and there is no availability until September. So she is wondering what would be the best course of action for scheduling this.    ----------------------------------------------------------------------- From previous Reason for Contact - Scheduling: Patient/patient representative is calling to schedule an appointment. Refer to attachments for appointment information.

## 2023-08-30 NOTE — Therapy (Signed)
 OUTPATIENT PHYSICAL THERAPY SHOULDER TREATMENT   Patient Name: Wanda Lang MRN: [id number] DOB:[birth year], 73 y.o., female Today's Date: 08/30/2023  END OF SESSION:  PT End of Session - 08/30/23 1019     Visit Number 13    Number of Visits 20    Date for PT Re-Evaluation 09/26/23    Authorization Type human[employer] MCR    Authorization Time Period 07/10/2023-08/31/2023    Authorization - Number of Visits 15    PT Start Time 0939    PT Stop Time 1020    PT Time Calculation (min) 41 min    Activity Tolerance Patient tolerated treatment well    Behavior During Therapy WFL for tasks assessed/performed                  Past Medical History:  Diagnosis Date   Allergy    Arthritis    Coronary artery disease    Fatty liver    GERD (gastroesophageal reflux disease)    Heart murmur    Hypertension    Hx   LIVER FUNCTION TESTS, ABNORMAL, HX OF 09/08/2007   Qualifier: Diagnosis of  By: Nelson-Smith CMA (AAMA), Dottie     Neuromuscular disorder (HCC)    Essential Tremor   Overactive bladder    Past Surgical History:  Procedure Laterality Date   ABDOMINAL HYSTERECTOMY     COLONOSCOPY WITH PROPOFOL  N/A 09/04/2018   Procedure: COLONOSCOPY WITH PROPOFOL ;  Surgeon: Janalyn Keene NOVAK, MD;  Location: [facility];  Service: Endoscopy;  Laterality: N/A;   ESOPHAGOGASTRODUODENOSCOPY (EGD) WITH PROPOFOL  N/A 09/04/2018   Procedure: ESOPHAGOGASTRODUODENOSCOPY (EGD) WITH PROPOFOL ;  Surgeon: Janalyn Keene NOVAK, MD;  Location: [facility];  Service: Endoscopy;  Laterality: N/A;   LAPAROSCOPY     for endometriosis x2   MOUTH SURGERY  04/2023   tooth extraction, gum graft   TONSILLECTOMY     TOTAL HIP ARTHROPLASTY Right 05/15/2022   Procedure: RIGHT TOTAL HIP ARTHROPLASTY ANTERIOR APPROACH;  Surgeon: Vernetta Lonni GRADE, MD;  Location: [facility];  Service: Orthopedics;  Laterality: Right;   TOTAL KNEE ARTHROPLASTY Left 07/18/2021   Procedure: TOTAL KNEE ARTHROPLASTY;   Surgeon: Ernie Cough, MD;  Location: [facility];  Service: Orthopedics;  Laterality: Left;   TURBINATE REDUCTION     Patient Active Problem List   Diagnosis Date Noted   Protracted URI 05/03/2023   Acute non-recurrent pansinusitis 02/04/2023   OSA (obstructive sleep apnea) 11/14/2022   Arthritis 11/14/2022   Nocturia 10/05/2022   Hypersomnia 10/05/2022   S/P total hip arthroplasty 05/17/2022   Status post total replacement of right hip 05/15/2022   Overactive bladder 04/04/2022   Postmenopausal estrogen deficiency 11/13/2021   Primary osteoarthritis 11/13/2021   Leg length inequality 11/06/2021   Impaired ambulation 10/23/2021   Contact dermatitis 10/16/2021   SI joint arthritis (HCC) 08/24/2021   Spinal stenosis, lumbar region, without neurogenic claudication 08/24/2021   Degeneration of lumbar intervertebral disc 07/24/2021   S/P total knee arthroplasty, left 07/18/2021   Preop examination 07/07/2021   Right hip pain 07/07/2021   Low back pain 07/07/2021   Drug-induced immunodeficiency (HCC) 06/12/2021   Impingement syndrome of right shoulder region 06/12/2021   COVID-19 01/24/2021   Heart murmur 11/24/2020   Hammer toe 04/22/2020   Metatarsalgia of left foot 04/22/2020   Osteoarthritis of midfoot 04/22/2020   Duodenal ulcer 11/12/2019   Stress 11/12/2019   Morbid obesity (HCC) 11/06/2019   Pain in left knee 07/30/2019   Aortic stenosis, moderate 02/06/2018   Chronic  diarrhea 10/09/2017   Acid reflux 07/15/2017   Asymptomatic carotid artery stenosis 07/15/2017   Cyst of skin 07/15/2017   Right maxillary sinusitis 11/09/2016   Immunocompromised (HCC) 11/09/2016   Allergic rhinitis 11/02/2016   Benign essential tremor 11/02/2016   History of hypertension 11/02/2016   Postmenopausal symptoms 07/01/2015   Fatty liver 09/08/2007   Rheumatoid arthritis (HCC) 09/08/2007     REFERRING PROVIDER: Carilyn Prentice BRAVO, MD  REFERRING DIAG: [phone] (ICD-10-CM) -  Chronic right shoulder pain  THERAPY DIAG:  Right shoulder pain, unspecified chronicity  Stiffness of right shoulder, not elsewhere classified  Muscle weakness (generalized)  Rationale for Evaluation and Treatment: Rehabilitation  ONSET DATE: Late 2024/January 2025  SUBJECTIVE:                                                                                                                                                                                      SUBJECTIVE STATEMENT:  Pt saw Dr. Genelle and plans to have surgery.  Pt is having a CT scan.  Pt reports having soreness the following day after prior Rx which is normal.  Pt states it hasn't been as bad this week but she has had the effects of a ESI in her system.        Hand dominance: Right  PERTINENT HISTORY: Chronic LBP, multilevel DDD, SI pain R THA 04/24 L TKA 06/23 RA Right Knee End-stage OA  Bladder incontinence    PAIN:  Location: R post shoulder NPRS:  3/10 current, 7/10 worst, 0/10 best   PRECAUTIONS: RA, R THA, L TKA  RED FLAGS: Bowel or bladder incontinence: Yes:     WEIGHT BEARING RESTRICTIONS: No  FALLS:  Has patient fallen in last 6 months? No  LIVING ENVIRONMENT: Lives with: lives alone Lives in: 2 story home though mainly lives on the 1st floor Stairs: yes Has following equipment at home: chair lift on stairs, walking stick, FWW  OCCUPATION: retired  PLOF: Independent  PATIENT GOALS:to get rid of pain in shoulder and use her R arm.  NEXT MD VISIT:   OBJECTIVE:  Note: Objective measures were completed at Evaluation unless otherwise noted.  DIAGNOSTIC FINDINGS:  R shoulder x ray:   FINDINGS: There is diffuse decreased bone mineralization. Severe glenohumeral joint space narrowing with bone-on-bone contact, subchondral sclerosis, subchondral cystic change, and moderate superomedial humeral head and glenoid fossa cortical flattening/remodeling.   Mild acromioclavicular joint  space narrowing and peripheral osteophytosis.   No acute fracture or dislocation.   The visualized portion of the right lung is unremarkable.   IMPRESSION: 1. Severe glenohumeral osteoarthritis. 2. Mild acromioclavicular osteoarthritis.   PATIENT SURVEYS:  UEFI: Initial/Current:  20/80  /  31/80  COGNITION: Overall cognitive status: Within functional limits for tasks assessed      UPPER EXTREMITY ROM:   AROM/PROM Right eval Left eval Right 7/16  Shoulder flexion 62 with pain 152 78 with tightness and little pain  AAROM: (supine) 112  PROM:  116  Shoulder scaption 52 with pain 155 72 with pain  Shoulder abduction 38 with pain 138 64 with some pain  Shoulder adduction     Shoulder internal rotation 47/44    Shoulder external rotation 24/22   16/18  Elbow flexion     Elbow extension     Wrist flexion     Wrist extension     Wrist ulnar deviation     Wrist radial deviation     Wrist pronation     Wrist supination     (Blank rows = not tested)  UPPER EXTREMITY MMT:  MMT Right eval Left eval  Shoulder flexion  5/5  Shoulder extension    Shoulder abduction  4+/5  Shoulder adduction    Shoulder internal rotation Tolerated min to mod resistance with pain WFL in sitting  Shoulder external rotation Tolerated min resistance with pain Tolerated mod resistance in sitting  Middle trapezius    Lower trapezius    Elbow flexion    Elbow extension    Wrist flexion    Wrist extension    Wrist ulnar deviation    Wrist radial deviation    Wrist pronation    Wrist supination    Grip strength (lbs)    (Blank rows = not tested)  SHOULDER SPECIAL TESTS: ER lag:  negative with arm at side                                                                                                                             TREATMENT:   08/30/23 Pt received R shoulder PROM in flexion, scaption, and ER in supine per pt and tissue tolerance.  Supine serratus punch 3x10 Supine  rhythmic stab's (gently) at 90 deg flexion 3x30 sec Supine ER/IR rhythmic stabs at 30 deg 3x30 sec  Supine ER AROM 2x10 Standing forward punches 2x10 Standing row GTB with retraction 2 x 15 Standing shoulder extension with GTB 2x15 Standing ball rolls at table (green physioball) 2x10 in flexion and scaption   08/26/23 Pulleys in flexion approx 15 and 10 reps, attempted scaption Standing ball rolls at table (green physioball) 2x10 in flexion and scaption Standing shoulder flexion in front of mirror (partial range) x10 Standing forward punches 2x10 Supine serratus punches 3x10 Supine ER AROM 2x10 Submaximal shoulder isometrics in standing with 5 sec hold in flexion, abd, and ER  Pt received R shoulder PROM in flexion, scaption, and ER per pt and tissue tolerance  08/21/2023 Pulleys in flexion approx 20 reps, scaption x 10 reps Standing ball rolls at table- flexion 2 x10 (green physioball), x10 scaption Supine serratus punches 3x10 Standing shoulder flexion in  front of mirror (partial range) x10- cues for decreasing shoulder hike Standing forward punches x 10 Standing row GTB 2 x 15  See above for ROM and UEFI.    08/19/2023 R shoulder PROM Rhythmic stabilization IR/ER at 45deg ab- 1/2 roll under arm (gentle pressure) Supine well arm assisted flexion x 10 reps Supine wand flexion x 10 reps Supine serratus punch  2x10 reps Standing ball rolls at table- flexion 2 x10 (green physioball), 2x10 scaption ER/IR/ext/flex isometrics with 5 sec hold x 10 reps each Standing row GTB 2 x 15 Standing shoulder extension GTB 2 x 15 Standing shoulder flexion in front of mirror (partial range) 2x10- cues for decreasing shoulder hike  Shoulder extension with cane behind back 2x10  08/16/2023 R shoulder PROM Supine well arm assisted flexion x 10 reps Supine wand flexion x 10 reps Supine serratus punch  2x10 reps Standing ball rolls at table- flexion 2 x10 (green physioball), 1x10  scaption ER/IR/ext/flex isometrics with 5 sec hold x 10 reps each Standing row GTB 2 x 10 Standing shoulder extension GTB 2 x 10  Standing shoulder flexion in front of mirror (partial range) 2x10- cues for decreasing shoulder hike Standing short arc abduction bil 2x10 Shoulder extension with cane behind back 2x10    PATIENT EDUCATION: Education details: dx, HEP, POC, exercise form, and rationale of interventions.   Person educated: Patient Education method: Explanation, Demonstration, Tactile cues, Verbal cues, and Handouts Education comprehension: verbalized understanding, returned demonstration, verbal cues required, tactile cues required, and needs further education  HOME EXERCISE PROGRAM: Access Code: [email or web address] URL: [email or web address] Date: 07/11/2023 Prepared by: Mose Minerva  Exercises - Seated Scapular Retraction  - 2 x daily - 7 x weekly - 2 sets - 10 reps - 3 second hold - Seated Shoulder Flexion Towel Slide at Table Top  - 2 x daily - 7 x weekly - 2 sets - 10 reps  ASSESSMENT:  CLINICAL IMPRESSION:  Pt saw ortho MD and is planning to have surgery.  Pt has limitations with ADL's/IADLs and continues to have limited shoulder mobility.  PT performed shoulder PROM per pt and tissue tolerance and she is limited in PROM especially ER.  Pt performed supine ER AROM and is limited with ROM.  PT performed supine rhythmic stabs in supine and she tolerated those exercises well.  Pt is able to independently lift UE and perform serratus punches in supine.  Pt responded well to Rx and states she feels more loose after Rx.  Pt reports feeling a little tired and pain might be a 4/10 after Rx.  Pt may benefit from cont skilled PT to address ongoing goals, improve strength and ROM, and maximize functional usage of R UE.      OBJECTIVE IMPAIRMENTS: decreased activity tolerance, decreased ROM, decreased strength, hypomobility, impaired flexibility, impaired UE functional  use, and pain.   ACTIVITY LIMITATIONS: bathing, dressing, reach over head, and hygiene/grooming  PARTICIPATION LIMITATIONS: meal prep, cleaning, and laundry  PERSONAL FACTORS: 1-2 comorbidities: RA and chronic back pain are also affecting patient's functional outcome.   REHAB POTENTIAL: Good  CLINICAL DECISION MAKING: Stable/uncomplicated  EVALUATION COMPLEXITY: Low   GOALS:  SHORT TERM GOALS: Target date:  07/31/23   Pt will be independent and compliant with HEP for improved pain, ROM, strength, and function.  Baseline: Goal status: GOAL MET  2.  Pt will demo at least a 30 deg increase in flexion, scaption, and abd AROM for improved shoulder mobility and stiffness.  Baseline:  Goal  status: PROGRESSING  7/16  3.  Pt will report at least a 25% improvement in performance of ADL's/self care activities.  Baseline:  Goal status: GOAL MET  7/16  4.  Pt will be able to wash and dry her hair without significant difficulty.  Baseline:  Goal status: NOT MET Target date:  08/07/2023    LONG TERM GOALS: Target date:  09/26/23  Pt will be able to reach overhead into a cabinet without significant pain and difficulty.  Baseline:  Goal status: NOT MET  7/16  2.  Pt will be able to perform her ADLs and IADLs without significant difficulty and pain. Baseline:  Goal status: PROGRESSING  3.  Pt will demo improved R shoulder AROM to at least 130 deg in flexion and 50 deg in ER for improved reaching and performance of ADLs.  Baseline:  Goal status: NOT MET  4.  Pt will tolerate strengthening and stability exercises without adverse effects to improve R glenohumeral and scapular strength and stability to perform reaching activities, household chores, and functional lifting activities.   Baseline:  Goal status: ONGOING    PLAN:  PT FREQUENCY: 2x/week  PT DURATION:  4-5 weeks  PLANNED INTERVENTIONS: 97164- PT Re-evaluation, 97750- Physical Performance Testing, 97110-Therapeutic  exercises, 97530- Therapeutic activity, [id number]- Neuromuscular re-education, 97535- Self Care, [id number]- Manual therapy, [id number]- Electrical stimulation (unattended), [id number]- Electrical stimulation (manual), [id number]- Ultrasound, Patient/Family education, Taping, Cryotherapy, and Moist heat  PLAN FOR NEXT SESSION: Cont with shoulder ROM, strengthening, and stability per pt tolerance.  Review and perform HEP.     Leigh Minerva III PT, DPT 08/30/23 12:03 PM   Referring diagnosis? M25.511,G89.29 (ICD-10-CM) - Chronic right shoulder pain   Treatment diagnosis? (if different than referring diagnosis)  Right shoulder pain, unspecified chronicity   Stiffness of right shoulder, not elsewhere classified   Muscle weakness (generalized)   What was this (referring dx) caused by? []  Surgery []  Fall []  Ongoing issue []  Arthritis []  Other: Pt has no specific MOI, but does have arthritis   Laterality: [x]  Rt []  Lt []  Both   Check all possible CPT codes:             *CHOOSE 10 OR LESS*                          See Planned Interventions listed in the Plan section of the Evaluation.

## 2023-09-02 DIAGNOSIS — I10 Essential (primary) hypertension: Secondary | ICD-10-CM | POA: Diagnosis not present

## 2023-09-02 DIAGNOSIS — R0609 Other forms of dyspnea: Secondary | ICD-10-CM | POA: Diagnosis not present

## 2023-09-02 DIAGNOSIS — I350 Nonrheumatic aortic (valve) stenosis: Secondary | ICD-10-CM | POA: Diagnosis not present

## 2023-09-02 DIAGNOSIS — I3481 Nonrheumatic mitral (valve) annulus calcification: Secondary | ICD-10-CM | POA: Diagnosis not present

## 2023-09-02 DIAGNOSIS — M9904 Segmental and somatic dysfunction of sacral region: Secondary | ICD-10-CM | POA: Diagnosis not present

## 2023-09-02 DIAGNOSIS — M9902 Segmental and somatic dysfunction of thoracic region: Secondary | ICD-10-CM | POA: Diagnosis not present

## 2023-09-02 DIAGNOSIS — M9903 Segmental and somatic dysfunction of lumbar region: Secondary | ICD-10-CM | POA: Diagnosis not present

## 2023-09-02 DIAGNOSIS — M9901 Segmental and somatic dysfunction of cervical region: Secondary | ICD-10-CM | POA: Diagnosis not present

## 2023-09-02 DIAGNOSIS — Z79899 Other long term (current) drug therapy: Secondary | ICD-10-CM | POA: Diagnosis not present

## 2023-09-02 DIAGNOSIS — G4733 Obstructive sleep apnea (adult) (pediatric): Secondary | ICD-10-CM | POA: Diagnosis not present

## 2023-09-03 ENCOUNTER — Encounter (HOSPITAL_BASED_OUTPATIENT_CLINIC_OR_DEPARTMENT_OTHER): Payer: Self-pay | Admitting: Physical Therapy

## 2023-09-03 ENCOUNTER — Ambulatory Visit (HOSPITAL_BASED_OUTPATIENT_CLINIC_OR_DEPARTMENT_OTHER): Admitting: Physical Therapy

## 2023-09-03 DIAGNOSIS — M6281 Muscle weakness (generalized): Secondary | ICD-10-CM

## 2023-09-03 DIAGNOSIS — M25511 Pain in right shoulder: Secondary | ICD-10-CM

## 2023-09-03 DIAGNOSIS — M25611 Stiffness of right shoulder, not elsewhere classified: Secondary | ICD-10-CM | POA: Diagnosis not present

## 2023-09-03 NOTE — Therapy (Signed)
 OUTPATIENT PHYSICAL THERAPY SHOULDER TREATMENT   Patient Name: Wanda Lang MRN: [id number] DOB:Jun 30, 1950, 73 y.o., female Today's Date: 09/04/2023  END OF SESSION:  PT End of Session - 09/03/23 1507     Visit Number 14    Number of Visits 20    Date for PT Re-Evaluation 09/26/23    Authorization Type human[employer] MCR    PT Start Time 1501    PT Stop Time 1544    PT Time Calculation (min) 43 min    Activity Tolerance Patient tolerated treatment well    Behavior During Therapy WFL for tasks assessed/performed                  Past Medical History:  Diagnosis Date   Allergy    Arthritis    Coronary artery disease    Fatty liver    GERD (gastroesophageal reflux disease)    Heart murmur    Hypertension    Hx   LIVER FUNCTION TESTS, ABNORMAL, HX OF 09/08/2007   Qualifier: Diagnosis of  By: Nelson-Smith CMA (AAMA), Dottie     Neuromuscular disorder (HCC)    Essential Tremor   Overactive bladder    Past Surgical History:  Procedure Laterality Date   ABDOMINAL HYSTERECTOMY     COLONOSCOPY WITH PROPOFOL  N/A 09/04/2018   Procedure: COLONOSCOPY WITH PROPOFOL ;  Surgeon: Janalyn Keene NOVAK, MD;  Location: [facility];  Service: Endoscopy;  Laterality: N/A;   ESOPHAGOGASTRODUODENOSCOPY (EGD) WITH PROPOFOL  N/A 09/04/2018   Procedure: ESOPHAGOGASTRODUODENOSCOPY (EGD) WITH PROPOFOL ;  Surgeon: Janalyn Keene NOVAK, MD;  Location: [facility];  Service: Endoscopy;  Laterality: N/A;   LAPAROSCOPY     for endometriosis x2   MOUTH SURGERY  04/2023   tooth extraction, gum graft   TONSILLECTOMY     TOTAL HIP ARTHROPLASTY Right 05/15/2022   Procedure: RIGHT TOTAL HIP ARTHROPLASTY ANTERIOR APPROACH;  Surgeon: Vernetta Lonni GRADE, MD;  Location: [facility];  Service: Orthopedics;  Laterality: Right;   TOTAL KNEE ARTHROPLASTY Left 07/18/2021   Procedure: TOTAL KNEE ARTHROPLASTY;  Surgeon: Ernie Cough, MD;  Location: [facility];  Service: Orthopedics;  Laterality: Left;    TURBINATE REDUCTION     Patient Active Problem List   Diagnosis Date Noted   Protracted URI 05/03/2023   Acute non-recurrent pansinusitis 02/04/2023   OSA (obstructive sleep apnea) 11/14/2022   Arthritis 11/14/2022   Nocturia 10/05/2022   Hypersomnia 10/05/2022   S/P total hip arthroplasty 05/17/2022   Status post total replacement of right hip 05/15/2022   Overactive bladder 04/04/2022   Postmenopausal estrogen deficiency 11/13/2021   Primary osteoarthritis 11/13/2021   Leg length inequality 11/06/2021   Impaired ambulation 10/23/2021   Contact dermatitis 10/16/2021   SI joint arthritis (HCC) 08/24/2021   Spinal stenosis, lumbar region, without neurogenic claudication 08/24/2021   Degeneration of lumbar intervertebral disc 07/24/2021   S/P total knee arthroplasty, left 07/18/2021   Preop examination 07/07/2021   Right hip pain 07/07/2021   Low back pain 07/07/2021   Drug-induced immunodeficiency (HCC) 06/12/2021   Impingement syndrome of right shoulder region 06/12/2021   COVID-19 01/24/2021   Heart murmur 11/24/2020   Hammer toe 04/22/2020   Metatarsalgia of left foot 04/22/2020   Osteoarthritis of midfoot 04/22/2020   Duodenal ulcer 11/12/2019   Stress 11/12/2019   Morbid obesity (HCC) 11/06/2019   Pain in left knee 07/30/2019   Aortic stenosis, moderate 02/06/2018   Chronic diarrhea 10/09/2017   Acid reflux 07/15/2017   Asymptomatic carotid artery stenosis 07/15/2017  Cyst of skin 07/15/2017   Right maxillary sinusitis 11/09/2016   Immunocompromised (HCC) 11/09/2016   Allergic rhinitis 11/02/2016   Benign essential tremor 11/02/2016   History of hypertension 11/02/2016   Postmenopausal symptoms 07/01/2015   Fatty liver 09/08/2007   Rheumatoid arthritis (HCC) 09/08/2007     REFERRING PROVIDER: Carilyn Prentice BRAVO, MD  REFERRING DIAG: [phone] (ICD-10-CM) - Chronic right shoulder pain  THERAPY DIAG:  Right shoulder pain, unspecified  chronicity  Stiffness of right shoulder, not elsewhere classified  Muscle weakness (generalized)  Rationale for Evaluation and Treatment: Rehabilitation  ONSET DATE: Late 2024/January 2025  SUBJECTIVE:                                                                                                                                                                                      SUBJECTIVE STATEMENT:  Pt denies any adverse effects after prior Rx.  Pt saw cardiac yesterday in order to get cleared for shoulder surgery.  She has to have a cardiac catheterization.           Hand dominance: Right  PERTINENT HISTORY: Chronic LBP, multilevel DDD, SI pain R THA 04/24 L TKA 06/23 RA Right Knee End-stage OA  Bladder incontinence    PAIN:  Location: R post shoulder NPRS:  2/10 current, 7/10 worst, 0/10 best   PRECAUTIONS: RA, R THA, L TKA  RED FLAGS: Bowel or bladder incontinence: Yes:     WEIGHT BEARING RESTRICTIONS: No  FALLS:  Has patient fallen in last 6 months? No  LIVING ENVIRONMENT: Lives with: lives alone Lives in: 2 story home though mainly lives on the 1st floor Stairs: yes Has following equipment at home: chair lift on stairs, walking stick, FWW  OCCUPATION: retired  PLOF: Independent  PATIENT GOALS:to get rid of pain in shoulder and use her R arm.  NEXT MD VISIT:   OBJECTIVE:  Note: Objective measures were completed at Evaluation unless otherwise noted.  DIAGNOSTIC FINDINGS:  R shoulder x ray:   FINDINGS: There is diffuse decreased bone mineralization. Severe glenohumeral joint space narrowing with bone-on-bone contact, subchondral sclerosis, subchondral cystic change, and moderate superomedial humeral head and glenoid fossa cortical flattening/remodeling.   Mild acromioclavicular joint space narrowing and peripheral osteophytosis.   No acute fracture or dislocation.   The visualized portion of the right lung is unremarkable.    IMPRESSION: 1. Severe glenohumeral osteoarthritis. 2. Mild acromioclavicular osteoarthritis.   PATIENT SURVEYS:  UEFI: Initial/Current: 20/80  /  31/80  COGNITION: Overall cognitive status: Within functional limits for tasks assessed      UPPER EXTREMITY ROM:   AROM/PROM Right eval Left eval Right 7/16  Shoulder flexion  62 with pain 152 78 with tightness and little pain  AAROM: (supine) 112  PROM:  116  Shoulder scaption 52 with pain 155 72 with pain  Shoulder abduction 38 with pain 138 64 with some pain  Shoulder adduction     Shoulder internal rotation 47/44    Shoulder external rotation 24/22   16/18  Elbow flexion     Elbow extension     Wrist flexion     Wrist extension     Wrist ulnar deviation     Wrist radial deviation     Wrist pronation     Wrist supination     (Blank rows = not tested)  UPPER EXTREMITY MMT:  MMT Right eval Left eval  Shoulder flexion  5/5  Shoulder extension    Shoulder abduction  4+/5  Shoulder adduction    Shoulder internal rotation Tolerated min to mod resistance with pain WFL in sitting  Shoulder external rotation Tolerated min resistance with pain Tolerated mod resistance in sitting  Middle trapezius    Lower trapezius    Elbow flexion    Elbow extension    Wrist flexion    Wrist extension    Wrist ulnar deviation    Wrist radial deviation    Wrist pronation    Wrist supination    Grip strength (lbs)    (Blank rows = not tested)  SHOULDER SPECIAL TESTS: ER lag:  negative with arm at side                                                                                                                             TREATMENT:   09/02/23 Standing ball rolls at table (green physioball) 2x10 in flexion and scaption Pulleys in flexion Standing forward punches 3x10 Standing row GTB with retraction 2 x 15 Standing shoulder extension with GTB 2x15 Supine serratus punch 3x10 Supine rhythmic stab's at 90 deg flexion  gently 3x30 sec Supine ER AROM 2x10 Supine flexion with manual assistance from PT 2x10 Supine ER/IR rhythmic stabs at 30 deg 3x30 sec  Pt received R shoulder PROM in flexion, scaption, and ER in supine per pt and tissue tolerance.   08/30/23 Pt received R shoulder PROM in flexion, scaption, and ER in supine per pt and tissue tolerance.  Supine serratus punch 3x10 Supine rhythmic stab's (gently) at 90 deg flexion 3x30 sec Supine ER/IR rhythmic stabs at 30 deg 3x30 sec  Supine ER AROM 2x10 Standing forward punches 2x10 Standing row GTB with retraction 2 x 15 Standing shoulder extension with GTB 2x15 Standing ball rolls at table (green physioball) 2x10 in flexion and scaption   08/26/23 Pulleys in flexion approx 15 and 10 reps, attempted scaption Standing ball rolls at table (green physioball) 2x10 in flexion and scaption Standing shoulder flexion in front of mirror (partial range) x10 Standing forward punches 2x10 Supine serratus punches 3x10 Supine ER AROM 2x10 Submaximal shoulder isometrics in standing with 5 sec hold  in flexion, abd, and ER  Pt received R shoulder PROM in flexion, scaption, and ER per pt and tissue tolerance  08/21/2023 Pulleys in flexion approx 20 reps, scaption x 10 reps Standing ball rolls at table- flexion 2 x10 (green physioball), x10 scaption Supine serratus punches 3x10 Standing shoulder flexion in front of mirror (partial range) x10- cues for decreasing shoulder hike Standing forward punches x 10 Standing row GTB 2 x 15  See above for ROM and UEFI.    08/19/2023 R shoulder PROM Rhythmic stabilization IR/ER at 45deg ab- 1/2 roll under arm (gentle pressure) Supine well arm assisted flexion x 10 reps Supine wand flexion x 10 reps Supine serratus punch  2x10 reps Standing ball rolls at table- flexion 2 x10 (green physioball), 2x10 scaption ER/IR/ext/flex isometrics with 5 sec hold x 10 reps each Standing row GTB 2 x 15 Standing shoulder extension  GTB 2 x 15 Standing shoulder flexion in front of mirror (partial range) 2x10- cues for decreasing shoulder hike  Shoulder extension with cane behind back 2x10    PATIENT EDUCATION: Education details: dx, HEP, POC, exercise form, and rationale of interventions.   Person educated: Patient Education method: Explanation, Demonstration, Tactile cues, Verbal cues, and Handouts Education comprehension: verbalized understanding, returned demonstration, verbal cues required, tactile cues required, and needs further education  HOME EXERCISE PROGRAM: Access Code: [email or web address] URL: [email or web address] Date: 07/11/2023 Prepared by: Mose Minerva  Exercises - Seated Scapular Retraction  - 2 x daily - 7 x weekly - 2 sets - 10 reps - 3 second hold - Seated Shoulder Flexion Towel Slide at Table Top  - 2 x daily - 7 x weekly - 2 sets - 10 reps  ASSESSMENT:  CLINICAL IMPRESSION:  Pt continues to be limited with shoulder ROM and has significant limitations with UE elevation.  Pt is planning to have shoulder surgery.  Pt gives good effort with all exercises.  She performed ther-ex and neuro re-ed activities well with cuing and instruction for correct form.  Pt is able to independently lift UE and perform serratus punches in supine.  Pt was able to perform supine flexion with manual assistance from PT well.  She responded well to Rx stating she feels sore with movement though no pain, but also feels more loose after Rx.      OBJECTIVE IMPAIRMENTS: decreased activity tolerance, decreased ROM, decreased strength, hypomobility, impaired flexibility, impaired UE functional use, and pain.   ACTIVITY LIMITATIONS: bathing, dressing, reach over head, and hygiene/grooming  PARTICIPATION LIMITATIONS: meal prep, cleaning, and laundry  PERSONAL FACTORS: 1-2 comorbidities: RA and chronic back pain are also affecting patient's functional outcome.   REHAB POTENTIAL: Good  CLINICAL DECISION MAKING:  Stable/uncomplicated  EVALUATION COMPLEXITY: Low   GOALS:  SHORT TERM GOALS: Target date:  07/31/23   Pt will be independent and compliant with HEP for improved pain, ROM, strength, and function.  Baseline: Goal status: GOAL MET  2.  Pt will demo at least a 30 deg increase in flexion, scaption, and abd AROM for improved shoulder mobility and stiffness.  Baseline:  Goal status: PROGRESSING  7/16  3.  Pt will report at least a 25% improvement in performance of ADL's/self care activities.  Baseline:  Goal status: GOAL MET  7/16  4.  Pt will be able to wash and dry her hair without significant difficulty.  Baseline:  Goal status: NOT MET Target date:  08/07/2023    LONG TERM GOALS: Target date:  09/26/23  Pt will be  able to reach overhead into a cabinet without significant pain and difficulty.  Baseline:  Goal status: NOT MET  7/16  2.  Pt will be able to perform her ADLs and IADLs without significant difficulty and pain. Baseline:  Goal status: PROGRESSING  3.  Pt will demo improved R shoulder AROM to at least 130 deg in flexion and 50 deg in ER for improved reaching and performance of ADLs.  Baseline:  Goal status: NOT MET  4.  Pt will tolerate strengthening and stability exercises without adverse effects to improve R glenohumeral and scapular strength and stability to perform reaching activities, household chores, and functional lifting activities.   Baseline:  Goal status: ONGOING    PLAN:  PT FREQUENCY: 2x/week  PT DURATION:  4-5 weeks  PLANNED INTERVENTIONS: 97164- PT Re-evaluation, 97750- Physical Performance Testing, 97110-Therapeutic exercises, 97530- Therapeutic activity, [id number]- Neuromuscular re-education, 97535- Self Care, [id number]- Manual therapy, [id number]- Electrical stimulation (unattended), [id number]- Electrical stimulation (manual), [id number]- Ultrasound, Patient/Family education, Taping, Cryotherapy, and Moist heat  PLAN FOR NEXT SESSION: Cont with shoulder ROM,  strengthening, and stability per pt tolerance.  Review and perform HEP.     Leigh Minerva III PT, DPT 09/04/23 10:57 PM

## 2023-09-04 ENCOUNTER — Ambulatory Visit
Admission: RE | Admit: 2023-09-04 | Discharge: 2023-09-04 | Disposition: A | Discharge status: Home / Self Care | Source: Ambulatory Visit | Attending: Orthopaedic Surgery

## 2023-09-04 DIAGNOSIS — M19011 Primary osteoarthritis, right shoulder: Secondary | ICD-10-CM

## 2023-09-04 DIAGNOSIS — M25511 Pain in right shoulder: Secondary | ICD-10-CM | POA: Diagnosis not present

## 2023-09-05 ENCOUNTER — Telehealth (HOSPITAL_BASED_OUTPATIENT_CLINIC_OR_DEPARTMENT_OTHER): Payer: Self-pay

## 2023-09-05 ENCOUNTER — Ambulatory Visit (HOSPITAL_BASED_OUTPATIENT_CLINIC_OR_DEPARTMENT_OTHER)

## 2023-09-05 ENCOUNTER — Encounter (HOSPITAL_BASED_OUTPATIENT_CLINIC_OR_DEPARTMENT_OTHER): Payer: Self-pay

## 2023-09-05 DIAGNOSIS — M6281 Muscle weakness (generalized): Secondary | ICD-10-CM

## 2023-09-05 DIAGNOSIS — M25611 Stiffness of right shoulder, not elsewhere classified: Secondary | ICD-10-CM

## 2023-09-05 DIAGNOSIS — M25511 Pain in right shoulder: Secondary | ICD-10-CM

## 2023-09-05 NOTE — Telephone Encounter (Signed)
 Called pt and left VM regarding missed appt. Left call back number.

## 2023-09-05 NOTE — Therapy (Signed)
 OUTPATIENT PHYSICAL THERAPY SHOULDER TREATMENT   Patient Name: SKYLEY GRANDMAISON MRN: [id number] DOB:Jun 09, 1950, 73 y.o., female Today's Date: 09/05/2023  END OF SESSION:  PT End of Session - 09/05/23 1459     Visit Number 15    Number of Visits 20    Date for PT Re-Evaluation 09/26/23    Authorization Type humana MCR    Authorization Time Period 07.28.2025 - 08.30.2025    Authorization - Visit Number 2    Authorization - Number of Visits 8    Progress Note Due on Visit 19    PT Start Time 1453   Pt arrived late   PT Stop Time 1515    PT Time Calculation (min) 22 min    Activity Tolerance Patient tolerated treatment well    Behavior During Therapy WFL for tasks assessed/performed                   Past Medical History:  Diagnosis Date   Allergy    Arthritis    Coronary artery disease    Fatty liver    GERD (gastroesophageal reflux disease)    Heart murmur    Hypertension    Hx   LIVER FUNCTION TESTS, ABNORMAL, HX OF 09/08/2007   Qualifier: Diagnosis of  By: Nelson-Smith CMA (AAMA), Dottie     Neuromuscular disorder (HCC)    Essential Tremor   Overactive bladder    Past Surgical History:  Procedure Laterality Date   ABDOMINAL HYSTERECTOMY     COLONOSCOPY WITH PROPOFOL  N/A 09/04/2018   Procedure: COLONOSCOPY WITH PROPOFOL ;  Surgeon: Janalyn Keene NOVAK, MD;  Location: [facility];  Service: Endoscopy;  Laterality: N/A;   ESOPHAGOGASTRODUODENOSCOPY (EGD) WITH PROPOFOL  N/A 09/04/2018   Procedure: ESOPHAGOGASTRODUODENOSCOPY (EGD) WITH PROPOFOL ;  Surgeon: Janalyn Keene NOVAK, MD;  Location: [facility];  Service: Endoscopy;  Laterality: N/A;   LAPAROSCOPY     for endometriosis x2   MOUTH SURGERY  04/2023   tooth extraction, gum graft   TONSILLECTOMY     TOTAL HIP ARTHROPLASTY Right 05/15/2022   Procedure: RIGHT TOTAL HIP ARTHROPLASTY ANTERIOR APPROACH;  Surgeon: Vernetta Lonni GRADE, MD;  Location: [facility];  Service: Orthopedics;  Laterality: Right;    TOTAL KNEE ARTHROPLASTY Left 07/18/2021   Procedure: TOTAL KNEE ARTHROPLASTY;  Surgeon: Ernie Cough, MD;  Location: [facility];  Service: Orthopedics;  Laterality: Left;   TURBINATE REDUCTION     Patient Active Problem List   Diagnosis Date Noted   Protracted URI 05/03/2023   Acute non-recurrent pansinusitis 02/04/2023   OSA (obstructive sleep apnea) 11/14/2022   Arthritis 11/14/2022   Nocturia 10/05/2022   Hypersomnia 10/05/2022   S/P total hip arthroplasty 05/17/2022   Status post total replacement of right hip 05/15/2022   Overactive bladder 04/04/2022   Postmenopausal estrogen deficiency 11/13/2021   Primary osteoarthritis 11/13/2021   Leg length inequality 11/06/2021   Impaired ambulation 10/23/2021   Contact dermatitis 10/16/2021   SI joint arthritis (HCC) 08/24/2021   Spinal stenosis, lumbar region, without neurogenic claudication 08/24/2021   Degeneration of lumbar intervertebral disc 07/24/2021   S/P total knee arthroplasty, left 07/18/2021   Preop examination 07/07/2021   Right hip pain 07/07/2021   Low back pain 07/07/2021   Drug-induced immunodeficiency (HCC) 06/12/2021   Impingement syndrome of right shoulder region 06/12/2021   COVID-19 01/24/2021   Heart murmur 11/24/2020   Hammer toe 04/22/2020   Metatarsalgia of left foot 04/22/2020   Osteoarthritis of midfoot 04/22/2020   Duodenal ulcer 11/12/2019  Stress 11/12/2019   Morbid obesity (HCC) 11/06/2019   Pain in left knee 07/30/2019   Aortic stenosis, moderate 02/06/2018   Chronic diarrhea 10/09/2017   Acid reflux 07/15/2017   Asymptomatic carotid artery stenosis 07/15/2017   Cyst of skin 07/15/2017   Right maxillary sinusitis 11/09/2016   Immunocompromised (HCC) 11/09/2016   Allergic rhinitis 11/02/2016   Benign essential tremor 11/02/2016   History of hypertension 11/02/2016   Postmenopausal symptoms 07/01/2015   Fatty liver 09/08/2007   Rheumatoid arthritis (HCC) 09/08/2007     REFERRING  PROVIDER: Carilyn Prentice BRAVO, MD  REFERRING DIAG: [phone] (ICD-10-CM) - Chronic right shoulder pain  THERAPY DIAG:  Right shoulder pain, unspecified chronicity  Stiffness of right shoulder, not elsewhere classified  Muscle weakness (generalized)  Rationale for Evaluation and Treatment: Rehabilitation  ONSET DATE: Late 2024/January 2025  SUBJECTIVE:                                                                                                                                                                                      SUBJECTIVE STATEMENT:  Pt reports        Hand dominance: Right  PERTINENT HISTORY: Chronic LBP, multilevel DDD, SI pain R THA 04/24 L TKA 06/23 RA Right Knee End-stage OA  Bladder incontinence    PAIN:  Location: R post shoulder NPRS:  2/10 current, 7/10 worst, 0/10 best   PRECAUTIONS: RA, R THA, L TKA  RED FLAGS: Bowel or bladder incontinence: Yes:     WEIGHT BEARING RESTRICTIONS: No  FALLS:  Has patient fallen in last 6 months? No  LIVING ENVIRONMENT: Lives with: lives alone Lives in: 2 story home though mainly lives on the 1st floor Stairs: yes Has following equipment at home: chair lift on stairs, walking stick, FWW  OCCUPATION: retired  PLOF: Independent  PATIENT GOALS:to get rid of pain in shoulder and use her R arm.  NEXT MD VISIT:   OBJECTIVE:  Note: Objective measures were completed at Evaluation unless otherwise noted.  DIAGNOSTIC FINDINGS:  R shoulder x ray:   FINDINGS: There is diffuse decreased bone mineralization. Severe glenohumeral joint space narrowing with bone-on-bone contact, subchondral sclerosis, subchondral cystic change, and moderate superomedial humeral head and glenoid fossa cortical flattening/remodeling.   Mild acromioclavicular joint space narrowing and peripheral osteophytosis.   No acute fracture or dislocation.   The visualized portion of the right lung is unremarkable.    IMPRESSION: 1. Severe glenohumeral osteoarthritis. 2. Mild acromioclavicular osteoarthritis.   PATIENT SURVEYS:  UEFI: Initial/Current: 20/80  /  31/80  COGNITION: Overall cognitive status: Within functional limits for tasks assessed      UPPER EXTREMITY ROM:  AROM/PROM Right eval Left eval Right 7/16  Shoulder flexion 62 with pain 152 78 with tightness and little pain  AAROM: (supine) 112  PROM:  116  Shoulder scaption 52 with pain 155 72 with pain  Shoulder abduction 38 with pain 138 64 with some pain  Shoulder adduction     Shoulder internal rotation 47/44    Shoulder external rotation 24/22   16/18  Elbow flexion     Elbow extension     Wrist flexion     Wrist extension     Wrist ulnar deviation     Wrist radial deviation     Wrist pronation     Wrist supination     (Blank rows = not tested)  UPPER EXTREMITY MMT:  MMT Right eval Left eval  Shoulder flexion  5/5  Shoulder extension    Shoulder abduction  4+/5  Shoulder adduction    Shoulder internal rotation Tolerated min to mod resistance with pain WFL in sitting  Shoulder external rotation Tolerated min resistance with pain Tolerated mod resistance in sitting  Middle trapezius    Lower trapezius    Elbow flexion    Elbow extension    Wrist flexion    Wrist extension    Wrist ulnar deviation    Wrist radial deviation    Wrist pronation    Wrist supination    Grip strength (lbs)    (Blank rows = not tested)  SHOULDER SPECIAL TESTS: ER lag:  negative with arm at side                                                                                                                             TREATMENT:   09/05/23 Standing ball rolls at table (green physioball) 2x10 in flexion and scaption Pulleys in flexion Standing forward punches 3x10 Standing row GTB with retraction 2 x 15 Standing shoulder extension with GTB 2x15 Supine flexion with manual assistance from PT 2x10   Pt received R  shoulder PROM in flexion, scaption, and ER in supine per pt and tissue tolerance.   09/02/23 Standing ball rolls at table (green physioball) 2x10 in flexion and scaption Pulleys in flexion Standing forward punches 3x10 Standing row GTB with retraction 2 x 15 Standing shoulder extension with GTB 2x15 Supine serratus punch 3x10 Supine rhythmic stab's at 90 deg flexion gently 3x30 sec Supine ER AROM 2x10 Supine flexion with manual assistance from PT 2x10 Supine ER/IR rhythmic stabs at 30 deg 3x30 sec  Pt received R shoulder PROM in flexion, scaption, and ER in supine per pt and tissue tolerance.   08/30/23 Pt received R shoulder PROM in flexion, scaption, and ER in supine per pt and tissue tolerance.  Supine serratus punch 3x10 Supine rhythmic stab's (gently) at 90 deg flexion 3x30 sec Supine ER/IR rhythmic stabs at 30 deg 3x30 sec  Supine ER AROM 2x10 Standing forward punches 2x10 Standing row GTB with retraction 2 x 15 Standing  shoulder extension with GTB 2x15 Standing ball rolls at table (green physioball) 2x10 in flexion and scaption   08/26/23 Pulleys in flexion approx 15 and 10 reps, attempted scaption Standing ball rolls at table (green physioball) 2x10 in flexion and scaption Standing shoulder flexion in front of mirror (partial range) x10 Standing forward punches 2x10 Supine serratus punches 3x10 Supine ER AROM 2x10 Submaximal shoulder isometrics in standing with 5 sec hold in flexion, abd, and ER  Pt received R shoulder PROM in flexion, scaption, and ER per pt and tissue tolerance  08/21/2023 Pulleys in flexion approx 20 reps, scaption x 10 reps Standing ball rolls at table- flexion 2 x10 (green physioball), x10 scaption Supine serratus punches 3x10 Standing shoulder flexion in front of mirror (partial range) x10- cues for decreasing shoulder hike Standing forward punches x 10 Standing row GTB 2 x 15  See above for ROM and UEFI.    08/19/2023 R shoulder  PROM Rhythmic stabilization IR/ER at 45deg ab- 1/2 roll under arm (gentle pressure) Supine well arm assisted flexion x 10 reps Supine wand flexion x 10 reps Supine serratus punch  2x10 reps Standing ball rolls at table- flexion 2 x10 (green physioball), 2x10 scaption ER/IR/ext/flex isometrics with 5 sec hold x 10 reps each Standing row GTB 2 x 15 Standing shoulder extension GTB 2 x 15 Standing shoulder flexion in front of mirror (partial range) 2x10- cues for decreasing shoulder hike  Shoulder extension with cane behind back 2x10    PATIENT EDUCATION: Education details: dx, HEP, POC, exercise form, and rationale of interventions.   Person educated: Patient Education method: Explanation, Demonstration, Tactile cues, Verbal cues, and Handouts Education comprehension: verbalized understanding, returned demonstration, verbal cues required, tactile cues required, and needs further education  HOME EXERCISE PROGRAM: Access Code: [email or web address] URL: [email or web address] Date: 07/11/2023 Prepared by: Mose Minerva  Exercises - Seated Scapular Retraction  - 2 x daily - 7 x weekly - 2 sets - 10 reps - 3 second hold - Seated Shoulder Flexion Towel Slide at Table Top  - 2 x daily - 7 x weekly - 2 sets - 10 reps  ASSESSMENT:  CLINICAL IMPRESSION:  Pt arrived 22 minutes late for appt today, mistaking the time. Session time was limited. She continues to experience pain and limited mobility with PROM. Crepitus reported throughout session. Good tolerance for theraband resisted rows and extension. Instructed pt to avoid pushing into pain with tasks.      OBJECTIVE IMPAIRMENTS: decreased activity tolerance, decreased ROM, decreased strength, hypomobility, impaired flexibility, impaired UE functional use, and pain.   ACTIVITY LIMITATIONS: bathing, dressing, reach over head, and hygiene/grooming  PARTICIPATION LIMITATIONS: meal prep, cleaning, and laundry  PERSONAL FACTORS: 1-2  comorbidities: RA and chronic back pain are also affecting patient's functional outcome.   REHAB POTENTIAL: Good  CLINICAL DECISION MAKING: Stable/uncomplicated  EVALUATION COMPLEXITY: Low   GOALS:  SHORT TERM GOALS: Target date:  07/31/23   Pt will be independent and compliant with HEP for improved pain, ROM, strength, and function.  Baseline: Goal status: GOAL MET  2.  Pt will demo at least a 30 deg increase in flexion, scaption, and abd AROM for improved shoulder mobility and stiffness.  Baseline:  Goal status: PROGRESSING  7/16  3.  Pt will report at least a 25% improvement in performance of ADL's/self care activities.  Baseline:  Goal status: GOAL MET  7/16  4.  Pt will be able to wash and dry her hair without significant difficulty.  Baseline:  Goal status: NOT MET Target date:  08/07/2023    LONG TERM GOALS: Target date:  09/26/23  Pt will be able to reach overhead into a cabinet without significant pain and difficulty.  Baseline:  Goal status: NOT MET  7/16  2.  Pt will be able to perform her ADLs and IADLs without significant difficulty and pain. Baseline:  Goal status: PROGRESSING  3.  Pt will demo improved R shoulder AROM to at least 130 deg in flexion and 50 deg in ER for improved reaching and performance of ADLs.  Baseline:  Goal status: NOT MET  4.  Pt will tolerate strengthening and stability exercises without adverse effects to improve R glenohumeral and scapular strength and stability to perform reaching activities, household chores, and functional lifting activities.   Baseline:  Goal status: ONGOING    PLAN:  PT FREQUENCY: 2x/week  PT DURATION:  4-5 weeks  PLANNED INTERVENTIONS: 97164- PT Re-evaluation, 97750- Physical Performance Testing, 97110-Therapeutic exercises, 97530- Therapeutic activity, [id number]- Neuromuscular re-education, 97535- Self Care, 02859- Manual therapy, G0283- Electrical stimulation (unattended), [id number]- Electrical stimulation  (manual), [id number]- Ultrasound, Patient/Family education, Taping, Cryotherapy, and Moist heat  PLAN FOR NEXT SESSION: Cont with shoulder ROM, strengthening, and stability per pt tolerance.  Review and perform HEP.     Leigh Minerva III PT, DPT 09/05/23 3:50 PM

## 2023-09-09 ENCOUNTER — Ambulatory Visit (INDEPENDENT_AMBULATORY_CARE_PROVIDER_SITE_OTHER): Admitting: Surgery

## 2023-09-09 ENCOUNTER — Encounter: Payer: Self-pay | Admitting: Surgery

## 2023-09-09 ENCOUNTER — Other Ambulatory Visit
Admission: RE | Admit: 2023-09-09 | Discharge: 2023-09-09 | Disposition: A | Discharge status: Home / Self Care | Source: Ambulatory Visit | Attending: Surgery | Admitting: Surgery

## 2023-09-09 VITALS — BP 141/84 | HR 80 | Temp 98.0°F | Ht 64.5 in | Wt 233.0 lb

## 2023-09-09 DIAGNOSIS — R7989 Other specified abnormal findings of blood chemistry: Secondary | ICD-10-CM | POA: Diagnosis not present

## 2023-09-09 DIAGNOSIS — M9902 Segmental and somatic dysfunction of thoracic region: Secondary | ICD-10-CM | POA: Diagnosis not present

## 2023-09-09 DIAGNOSIS — Z860100 Personal history of colon polyps, unspecified: Secondary | ICD-10-CM

## 2023-09-09 DIAGNOSIS — K8020 Calculus of gallbladder without cholecystitis without obstruction: Secondary | ICD-10-CM

## 2023-09-09 DIAGNOSIS — M9904 Segmental and somatic dysfunction of sacral region: Secondary | ICD-10-CM | POA: Diagnosis not present

## 2023-09-09 DIAGNOSIS — M9903 Segmental and somatic dysfunction of lumbar region: Secondary | ICD-10-CM | POA: Diagnosis not present

## 2023-09-09 DIAGNOSIS — M9901 Segmental and somatic dysfunction of cervical region: Secondary | ICD-10-CM | POA: Diagnosis not present

## 2023-09-09 LAB — HEPATIC FUNCTION PANEL
ALT: 191 U/L — ABNORMAL HIGH (ref 0–44)
AST: 225 U/L — ABNORMAL HIGH (ref 15–41)
Albumin: 3.6 g/dL (ref 3.5–5.0)
Alkaline Phosphatase: 399 U/L — ABNORMAL HIGH (ref 38–126)
Bilirubin, Direct: 0.2 mg/dL (ref 0.0–0.2)
Indirect Bilirubin: 0.7 mg/dL (ref 0.3–0.9)
Total Bilirubin: 0.9 mg/dL (ref 0.0–1.2)
Total Protein: 7.1 g/dL (ref 6.5–8.1)

## 2023-09-09 NOTE — Patient Instructions (Addendum)
 We sent over a referral to Dr Unk at [facility] for you to have a Colonoscopy and Upper Endoscopy, they will call you for an appointment. If you do not hear from them you may call them at [phone]   I placed a order for you to have labs drawn, you may do those at any [facility] lab      Cholelithiasis  Cholelithiasis happens when gallstones form in the gallbladder. The gallbladder stores bile. Bile is a fluid that helps digest fats. Bile can harden and form into gallstones. If they cause a blockage, they can cause pain (gallbladder attack). What are the causes? This condition may be caused by: Too much bilirubin in the bile. This happens if you have sickle cell anemia. Too much of a fat-like substance (cholesterol) in your bile. Not enough bile salts in your bile. These salts help the body absorb and digest fats. The gallbladder not emptying fully or often enough. This is common in pregnant women. What increases the risk? The following factors may make you more likely to develop this condition: Being older than age 40. Eating a lot of fried foods, fat, and refined carbs (refined carbohydrates). Being female. Being pregnant many times. Using medicines with female hormones in them for a long time. Losing weight fast. Having gallstones in your family. Having health problems, such as diabetes, obesity, Crohn's disease, or liver disease. What are the signs or symptoms? Often, there may be gallstones but no symptoms. These gallstones are called silent gallstones. If a gallstone causes a blockage, you may get sudden pain. The pain: Can be in the upper right part of your belly (abdomen). Normally comes at night or after you eat. Can last an hour or more. Can spread to your right shoulder, back, or chest. Can feel like discomfort, burning, or fullness in the upper part of your belly (indigestion). If the blockage lasts more than a few hours, you can get an infection or swelling.  You may: Vomit or feel like you may vomit (nauseous). Feel bloated. Have belly pain for 5 hours or more. Feel tender in your belly, often in the upper right part and under your ribs. Have a fever or chills. Have skin or the white parts of your eyes turn yellow (jaundice). Have dark pee (urine) or pale poop (stool). How is this treated? Treatment for this condition depends on how bad you feel. If you have symptoms, you may need: Home care, if symptoms are not very bad. Do not eat for 12-24 hours. Drink only water and clear liquids. After 1 or 2 days, start to eat simple or clear foods. Try broth and crackers. You may need medicines for pain or stomach upset or both. If you have an infection, you will need antibiotics. A hospital stay, if you have very bad pain or a very bad infection. Surgery to remove your gallbladder. You may need this if: Gallstones keep coming back. You have very bad symptoms. Medicines to break up gallstones. Medicines may be used for 6-12 months. A procedure to find and take out gallstones or to break up gallstones. Follow these instructions at home: Medicines Take over-the-counter and prescription medicines only as told by your doctor. If you were prescribed antibiotics, take them as told by your doctor. Do not stop taking them even if you start to feel better. Ask your doctor if you should avoid driving or using machines while you are taking your medicine. Eating and drinking Drink enough fluid to keep  your pee pale yellow. Drink water or clear fluids. This is important when you have pain. Eat healthy foods. Choose: Fewer fatty foods, such as fried foods. Fewer refined carbs. Avoid breads and grains that are highly processed, such as white bread and white rice. Choose whole grains, such as whole-wheat bread and brown rice. More fiber. Almonds, fresh fruit, and beans are healthy sources. General instructions Keep a healthy weight. Keep all follow-up visits.  You may need to see a specialist or a Careers adviser. Where to find more information [employer] of Diabetes and Digestive and Kidney Diseases: [email or web address] Contact a doctor if: You have sudden pain in the upper right part of your belly. Pain might spread to your right shoulder, back, or chest. Your pain lasts more than 2 hours. You have been diagnosed with gallstones that have no symptoms and you get: Belly pain. Discomfort, burning, or fullness in the upper part of your abdomen. You keep feeling like you may vomit. You have dark pee or pale poop. Get help right away if: You have pain in your abdomen, that: Lasts more than 5 hours. Keeps getting worse. You have a fever or chills. You can't stop vomiting. Your skin or the white parts of your eyes turn yellow. This information is not intended to replace advice given to you by your health care provider. Make sure you discuss any questions you have with your health care provider. Document Revised: 11/06/2021 Document Reviewed: 11/06/2021 Elsevier Patient Education  2024 [employer].

## 2023-09-09 NOTE — Progress Notes (Signed)
 09/09/2023  Reason for Visit:  Cholelithiasis  Requesting Provider:  Leron Glance, NP  History of Present Illness: Wanda Lang is a 73 y.o. female presenting for evaluation of cholelithiasis and elevated LFTs.  She has incidental history of mild hepatic steatosis back in 2019/2020 with mildly elevated LFTs that prompted ultrasound of RUQ showing no gallstones but mild hepatic steatosis.  Her LFTs improved on subsequent check early 2020.  She has also intentionally lost about 20 lbs since then.  She had recent labs via her Rheumatologist Dr. Leni on 06/29/23 showing total bili of 0.4, AST 47, ALT 39, and Alk Phos of 192.  These labs were repeated by her PCP on 08/01/23 which showed a total bili of 0.8, AST 111, ALT 160, and Alk Phos 445.  This prompted a repeat ultrasound of the RUQ which showed cholelithiasis with normal wall thickness and without pericholecystic fluid.  Her CBD was 4  mm in diameter.  The patient reports currently no abdominal pain in the RUQ or epigastric area.  She reports that she has a history of a duodenal ulcer a few years ago and every few weeks she might have a small amount of discomfort in the epigastric area that goes away relatively quickly and she takes Tums for it.  Denies any radiation of pain to her back, any nausea or emesis.  Of note, the patient has history of rheumatoid arthritis and is on Orencia .  She also has a history of arthritis and is getting worked up for a right shoulder surgery.  As part of her workup, she was seen by her cardiologist as follow up for aortic valve stenosis and insufficiency and is scheduled for a right/left cardiac cath with Dr. Ammon on 09/19/23.  Past Medical History: Past Medical History:  Diagnosis Date   Allergy    Arthritis    Coronary artery disease    Fatty liver    GERD (gastroesophageal reflux disease)    Heart murmur    Hypertension    Hx   LIVER FUNCTION TESTS, ABNORMAL, HX OF 09/08/2007   Qualifier: Diagnosis of   By: Nelson-Smith CMA (AAMA), Dottie     Neuromuscular disorder [facility])    Essential Tremor   Overactive bladder      Past Surgical History: Past Surgical History:  Procedure Laterality Date   ABDOMINAL HYSTERECTOMY     COLONOSCOPY WITH PROPOFOL  N/A 09/04/2018   Procedure: COLONOSCOPY WITH PROPOFOL ;  Surgeon: Janalyn Keene NOVAK, MD;  Location: [facility] ENDOSCOPY;  Service: Endoscopy;  Laterality: N/A;   ESOPHAGOGASTRODUODENOSCOPY (EGD) WITH PROPOFOL  N/A 09/04/2018   Procedure: ESOPHAGOGASTRODUODENOSCOPY (EGD) WITH PROPOFOL ;  Surgeon: Janalyn Keene NOVAK, MD;  Location: [facility] ENDOSCOPY;  Service: Endoscopy;  Laterality: N/A;   LAPAROSCOPY     for endometriosis x2   MOUTH SURGERY  04/2023   tooth extraction, gum graft   TONSILLECTOMY     TOTAL HIP ARTHROPLASTY Right 05/15/2022   Procedure: RIGHT TOTAL HIP ARTHROPLASTY ANTERIOR APPROACH;  Surgeon: Vernetta Lonni GRADE, MD;  Location: [facility] OR;  Service: Orthopedics;  Laterality: Right;   TOTAL KNEE ARTHROPLASTY Left 07/18/2021   Procedure: TOTAL KNEE ARTHROPLASTY;  Surgeon: Ernie Cough, MD;  Location: [facility] ORS;  Service: Orthopedics;  Laterality: Left;   TURBINATE REDUCTION      Home Medications: Prior to Admission medications   Medication Sig Start Date End Date Taking? Authorizing Provider  Abatacept  (ORENCIA  IV) Inject into the vein.    [provider]  b complex vitamins capsule Take 1 capsule  by mouth daily.    [provider]  CALCIUM  PO Take 1,200 mg by mouth daily.    [provider]  celecoxib  (CELEBREX ) 200 MG capsule Take 200 mg by mouth daily.    [provider]  cycloSPORINE  (RESTASIS ) 0.05 % ophthalmic emulsion Place 1 drop into both eyes 2 (two) times daily.    [provider]  Glucosamine HCl 1000 MG TABS Take 1,000 mg by mouth daily.    [provider]  lidocaine  (LIDODERM ) 5 % Place 1 patch onto the skin daily.    [provider]  Magnesium  100 MG TABS Take  100 mg by mouth 2 (two) times a week.    [provider]  Multiple Vitamin (MULTIVITAMIN WITH MINERALS) TABS tablet Take 1 tablet by mouth daily.    [provider]  oxyCODONE  (OXY IR/ROXICODONE ) 5 MG immediate release tablet TAKE 1 TABLET BY MOUTH FOUR TIMES DAILY AS NEEDED FOR SEVERE PAIN. 05/15/23   [provider]  Probiotic Product (PROBIOTIC DAILY PO) Take 1 Dose by mouth daily.    [provider]  trolamine salicylate (BLUE-EMU HEMP) 10 % cream Apply 1 Application topically as needed for muscle pain.    [provider]  Vitamin D , Ergocalciferol , (DRISDOL) 1.25 MG (50000 UNIT) CAPS capsule Take 50,000 Units by mouth every 14 (fourteen) days. 07/06/22   [provider]    Allergies: Allergies  Allergen Reactions   Penicillins Itching    Tolerated Cephalosporin 07/18/21.   Other reaction(s): wheezing   Amoxicillin Itching   Misc. Sulfonamide Containing Compounds    Rosanil Cleanser [Sulfacetamide Sodium-Sulfur] Other (See Comments)    Unknown reaction    Sulfonamide Derivatives     Unknown reaction    Ylang-Ylang [Cananga Oil (Ylang-Ylang)] Itching    Social History:  reports that she has never smoked. She has never been exposed to tobacco smoke. She has never used smokeless tobacco. She reports that she does not currently use alcohol after a past usage of about 1.0 standard drink of alcohol per week. She reports that she does not use drugs.   Family History: Family History  Problem Relation Age of Onset   Alcohol abuse Mother    Arthritis Mother    Hyperlipidemia Mother    Heart disease Mother    Stroke Mother    Hypertension Mother    Depression Mother    Anxiety disorder Mother    Arthritis Father    Lung cancer Paternal Grandfather    Kidney disease Paternal Grandfather     Review of Systems: Review of Systems  Constitutional:  Negative for chills and fever.  Respiratory:  Negative for shortness of breath.    Cardiovascular:  Negative for chest pain.  Gastrointestinal:  Negative for abdominal pain, nausea and vomiting.    Physical Exam BP (!) 141/84   Pulse 80   Temp 98 F (36.7 C) (Oral)   Ht 5' 4.5 (1.638 m)   Wt 233 lb (105.7 kg)   SpO2 99%   BMI 39.38 kg/m  CONSTITUTIONAL: No acute distress. HEENT:  Normocephalic, atraumatic, extraocular motion intact. NECK: Trachea is midline, and there is no jugular venous distension.  RESPIRATORY:  Lungs are clear, and breath sounds are equal bilaterally. Normal respiratory effort without pathologic use of accessory muscles. CARDIOVASCULAR: Heart is regular with a systolic murmur consistent with her aortic stenosis GI: The abdomen is soft, non-distended, non-tender to palpation.  Negative Murphy's sign.  MUSCULOSKELETAL:  Normal muscle strength and tone  in all four extremities.  No peripheral edema or cyanosis. SKIN: Skin turgor is normal. There are no pathologic skin lesions.  NEUROLOGIC:  Motor and sensation is grossly normal.  Cranial nerves are grossly intact. PSYCH:  Alert and oriented to person, place and time. Affect is normal.  Laboratory Analysis: Labs on 06/29/23: Na 145, K 4.4, Cl 104, CO2 19, BUN 21, Cr 0.69.  Total bili 0.4, AST 47, ALT 39, Alk Phos 192.  WBC 5.8, Hgb 13.4, Hct 41.4, Plt 187.  Labs on 08/01/23: Tbili 0.8, AST 111, ALT 160, Alk Phos 445.  Imaging: Ultrasound RUQ on 08/07/23: IMPRESSION: Cholelithiasis without sonographic evidence of acute cholecystitis.  Assessment and Plan: This is a 73 y.o. female with cholelithiasis and elevated LFTs.  --Discussed with the patient the findings on her laboratory studies and her ultrasound.  Discussed what each lab means with respect to her liver and gallbladder.  Her ultrasound does not show any evidence of inflammatory changes that would cause increase in her LFTs and her CBD diameter is normal.  She also has not had any abdominal pain recently and it almost seems that her labs  would be incidental findings.  However, she has had prior labs where all her LFTs were normal so this is a change and two sets of labs which show worsening levels.   --The patient had other medical issues ongoing and she's about to have a cardiac cath for her aortic stenosis, which is moderate on her last echo on 05/29/23.  Discussed with her that given that she's overall asymptomatic from a gallbladder standpoint, for now there's no rush to proceed with any surgery for it and she should complete her cardiac workup.   --In the meantime, will repeat a new set of LFTs to check if this has normalized or not since the last check was almost 6 weeks ago.  Discussed with the patient that if they're still elevated, without much noted on her ultrasound, would be best to then get a MRCP to better evaluate her bile ducts and for any inflammatory changes.  Would be unusual without any abdominal symptoms.   --Will contact the patient with lab results and if MRCP is needed.  Otherwise, would see her in a month in follow up to check on her progress and decided on timing of any surgery needed. --Patient is in agreement with this plan and all of her questions have been answered.  I spent 40 minutes dedicated to the care of this patient on the date of this encounter to include pre-visit review of records, face-to-face time with the patient discussing diagnosis and management, and any post-visit coordination of care.   Aloysius Sheree Plant, MD [facility]

## 2023-09-10 ENCOUNTER — Telehealth: Payer: Self-pay

## 2023-09-10 ENCOUNTER — Ambulatory Visit: Admitting: Nurse Practitioner

## 2023-09-10 ENCOUNTER — Ambulatory Visit (HOSPITAL_BASED_OUTPATIENT_CLINIC_OR_DEPARTMENT_OTHER): Attending: Physical Medicine & Rehabilitation

## 2023-09-10 ENCOUNTER — Encounter (HOSPITAL_BASED_OUTPATIENT_CLINIC_OR_DEPARTMENT_OTHER): Payer: Self-pay

## 2023-09-10 ENCOUNTER — Other Ambulatory Visit: Payer: Self-pay

## 2023-09-10 DIAGNOSIS — K8020 Calculus of gallbladder without cholecystitis without obstruction: Secondary | ICD-10-CM

## 2023-09-10 DIAGNOSIS — M25511 Pain in right shoulder: Secondary | ICD-10-CM | POA: Diagnosis not present

## 2023-09-10 DIAGNOSIS — M6281 Muscle weakness (generalized): Secondary | ICD-10-CM | POA: Diagnosis not present

## 2023-09-10 DIAGNOSIS — R7989 Other specified abnormal findings of blood chemistry: Secondary | ICD-10-CM

## 2023-09-10 DIAGNOSIS — M25611 Stiffness of right shoulder, not elsewhere classified: Secondary | ICD-10-CM | POA: Diagnosis not present

## 2023-09-10 NOTE — Telephone Encounter (Signed)
 Called and spoke with the patient and let her know her results and recommendation for an MRCP. She will call and schedule this for at least a week prior to her follow up appointment with Dr Desiderio on 10/09/23.

## 2023-09-10 NOTE — Telephone Encounter (Signed)
 Message left for the patient to call the office back to review results and Dr Desiderio would like for her to have an MRCP(order already in Epic).

## 2023-09-10 NOTE — Therapy (Signed)
 OUTPATIENT PHYSICAL THERAPY SHOULDER TREATMENT   Patient Name: Wanda Lang MRN: [id number] DOB:1951-01-18, 73 y.o., female Today's Date: 09/10/2023  END OF SESSION:  PT End of Session - 09/10/23 1607     Visit Number 16    Number of Visits 20    Date for PT Re-Evaluation 09/26/23    Authorization Type human[employer] MCR    Authorization Time Period 07.28.2025 - 08.30.2025    Authorization - Visit Number 3    Authorization - Number of Visits 8    Progress Note Due on Visit 19    PT Start Time 1605    PT Stop Time 1648    PT Time Calculation (min) 43 min    Activity Tolerance Patient tolerated treatment well    Behavior During Therapy WFL for tasks assessed/performed                    Past Medical History:  Diagnosis Date   Allergy    Arthritis    Coronary artery disease    Fatty liver    GERD (gastroesophageal reflux disease)    Heart murmur    Hypertension    Hx   LIVER FUNCTION TESTS, ABNORMAL, HX OF 09/08/2007   Qualifier: Diagnosis of  By: Nelson-Smith CMA (AAMA), Dottie     Neuromuscular disorder (HCC)    Essential Tremor   Overactive bladder    Past Surgical History:  Procedure Laterality Date   ABDOMINAL HYSTERECTOMY     COLONOSCOPY WITH PROPOFOL  N/A 09/04/2018   Procedure: COLONOSCOPY WITH PROPOFOL ;  Surgeon: Janalyn Keene NOVAK, MD;  Location: [facility] ENDOSCOPY;  Service: Endoscopy;  Laterality: N/A;   ESOPHAGOGASTRODUODENOSCOPY (EGD) WITH PROPOFOL  N/A 09/04/2018   Procedure: ESOPHAGOGASTRODUODENOSCOPY (EGD) WITH PROPOFOL ;  Surgeon: Janalyn Keene NOVAK, MD;  Location: [facility] ENDOSCOPY;  Service: Endoscopy;  Laterality: N/A;   LAPAROSCOPY     for endometriosis x2   MOUTH SURGERY  04/2023   tooth extraction, gum graft   TONSILLECTOMY     TOTAL HIP ARTHROPLASTY Right 05/15/2022   Procedure: RIGHT TOTAL HIP ARTHROPLASTY ANTERIOR APPROACH;  Surgeon: Vernetta Lonni GRADE, MD;  Location: [facility] OR;  Service: Orthopedics;  Laterality: Right;   TOTAL KNEE  ARTHROPLASTY Left 07/18/2021   Procedure: TOTAL KNEE ARTHROPLASTY;  Surgeon: Ernie Cough, MD;  Location: [facility];  Service: Orthopedics;  Laterality: Left;   TURBINATE REDUCTION     Patient Active Problem List   Diagnosis Date Noted   Protracted URI 05/03/2023   Acute non-recurrent pansinusitis 02/04/2023   OSA (obstructive sleep apnea) 11/14/2022   Arthritis 11/14/2022   Nocturia 10/05/2022   Hypersomnia 10/05/2022   S/P total hip arthroplasty 05/17/2022   Status post total replacement of right hip 05/15/2022   Overactive bladder 04/04/2022   Postmenopausal estrogen deficiency 11/13/2021   Primary osteoarthritis 11/13/2021   Leg length inequality 11/06/2021   Impaired ambulation 10/23/2021   Contact dermatitis 10/16/2021   SI joint arthritis (HCC) 08/24/2021   Spinal stenosis, lumbar region, without neurogenic claudication 08/24/2021   Degeneration of lumbar intervertebral disc 07/24/2021   S/P total knee arthroplasty, left 07/18/2021   Preop examination 07/07/2021   Right hip pain 07/07/2021   Low back pain 07/07/2021   Drug-induced immunodeficiency (HCC) 06/12/2021   Impingement syndrome of right shoulder region 06/12/2021   COVID-19 01/24/2021   Heart murmur 11/24/2020   Hammer toe 04/22/2020   Metatarsalgia of left foot 04/22/2020   Osteoarthritis of midfoot 04/22/2020   Duodenal ulcer 11/12/2019   Stress 11/12/2019  Morbid obesity (HCC) 11/06/2019   Pain in left knee 07/30/2019   Aortic stenosis, moderate 02/06/2018   Chronic diarrhea 10/09/2017   Acid reflux 07/15/2017   Asymptomatic carotid artery stenosis 07/15/2017   Cyst of skin 07/15/2017   Right maxillary sinusitis 11/09/2016   Immunocompromised (HCC) 11/09/2016   Allergic rhinitis 11/02/2016   Benign essential tremor 11/02/2016   History of hypertension 11/02/2016   Postmenopausal symptoms 07/01/2015   Fatty liver 09/08/2007   Rheumatoid arthritis (HCC) 09/08/2007     REFERRING PROVIDER:  Carilyn Prentice BRAVO, MD  REFERRING DIAG: [phone] (ICD-10-CM) - Chronic right shoulder pain  THERAPY DIAG:  Stiffness of right shoulder, not elsewhere classified  Right shoulder pain, unspecified chronicity  Muscle weakness (generalized)  Rationale for Evaluation and Treatment: Rehabilitation  ONSET DATE: Late 2024/January 2025  SUBJECTIVE:                                                                                                                                                                                      SUBJECTIVE STATEMENT:  Pt reports she saw her doctor about her gallbladder who ordered imaging to be done this Saturday to try and figure out why her liver enzymes are elevated. Shoulder was soing pretty good until yesterday when it hurt. She rubbed biofreeze on it and it helped.        Hand dominance: Right  PERTINENT HISTORY: Chronic LBP, multilevel DDD, SI pain R THA 04/24 L TKA 06/23 RA Right Knee End-stage OA  Bladder incontinence    PAIN:  Location: R post shoulder NPRS:  2/10 current, 7/10 worst, 0/10 best   PRECAUTIONS: RA, R THA, L TKA  RED FLAGS: Bowel or bladder incontinence: Yes:     WEIGHT BEARING RESTRICTIONS: No  FALLS:  Has patient fallen in last 6 months? No  LIVING ENVIRONMENT: Lives with: lives alone Lives in: 2 story home though mainly lives on the 1st floor Stairs: yes Has following equipment at home: chair lift on stairs, walking stick, FWW  OCCUPATION: retired  PLOF: Independent  PATIENT GOALS:to get rid of pain in shoulder and use her R arm.  NEXT MD VISIT:   OBJECTIVE:  Note: Objective measures were completed at Evaluation unless otherwise noted.  DIAGNOSTIC FINDINGS:  R shoulder x ray:   FINDINGS: There is diffuse decreased bone mineralization. Severe glenohumeral joint space narrowing with bone-on-bone contact, subchondral sclerosis, subchondral cystic change, and moderate superomedial humeral head  and glenoid fossa cortical flattening/remodeling.   Mild acromioclavicular joint space narrowing and peripheral osteophytosis.   No acute fracture or dislocation.   The visualized portion of the right lung is unremarkable.   IMPRESSION: 1.  Severe glenohumeral osteoarthritis. 2. Mild acromioclavicular osteoarthritis.   PATIENT SURVEYS:  UEFI: Initial/Current: 20/80  /  31/80  COGNITION: Overall cognitive status: Within functional limits for tasks assessed      UPPER EXTREMITY ROM:   AROM/PROM Right eval Left eval Right 7/16  Shoulder flexion 62 with pain 152 78 with tightness and little pain  AAROM: (supine) 112  PROM:  116  Shoulder scaption 52 with pain 155 72 with pain  Shoulder abduction 38 with pain 138 64 with some pain  Shoulder adduction     Shoulder internal rotation 47/44    Shoulder external rotation 24/22   16/18  Elbow flexion     Elbow extension     Wrist flexion     Wrist extension     Wrist ulnar deviation     Wrist radial deviation     Wrist pronation     Wrist supination     (Blank rows = not tested)  UPPER EXTREMITY MMT:  MMT Right eval Left eval  Shoulder flexion  5/5  Shoulder extension    Shoulder abduction  4+/5  Shoulder adduction    Shoulder internal rotation Tolerated min to mod resistance with pain WFL in sitting  Shoulder external rotation Tolerated min resistance with pain Tolerated mod resistance in sitting  Middle trapezius    Lower trapezius    Elbow flexion    Elbow extension    Wrist flexion    Wrist extension    Wrist ulnar deviation    Wrist radial deviation    Wrist pronation    Wrist supination    Grip strength (lbs)    (Blank rows = not tested)  SHOULDER SPECIAL TESTS: ER lag:  negative with arm at side                                                                                                                             TREATMENT:   09/10/23 Standing ball rolls at table (green physioball) 2x10  in flexion and scaption Pulleys in flexion Standing forward punches 3x10 Standing row GTB with retraction 2 x 15 Standing shoulder extension with GTB 2x15 Well arm assist supine flexion x10 Supine flexion with manual assistance from PTA x10 Seated chicken wing x10 (had pain by end of 10) Cone reaches multidirection Seated cane press out x10 Seated cane flexion  Pt received R shoulder PROM in flexion, scaption, and ER in supine per pt and tissue tolerance. GHJ mobilizations Grade II posterior and inferior glides Instruction in theracane    09/05/23 Standing ball rolls at table (green physioball) 2x10 in flexion and scaption Pulleys in flexion Standing forward punches 3x10 Standing row GTB with retraction 2 x 15 Standing shoulder extension with GTB 2x15 Supine flexion with manual assistance from PT 2x10   Pt received R shoulder PROM in flexion, scaption, and ER in supine per pt and tissue tolerance.   09/02/23 Standing ball rolls at table (green physioball)  2x10 in flexion and scaption Pulleys in flexion Standing forward punches 3x10 Standing row GTB with retraction 2 x 15 Standing shoulder extension with GTB 2x15 Supine serratus punch 3x10 Supine rhythmic stab's at 90 deg flexion gently 3x30 sec Supine ER AROM 2x10 Supine flexion with manual assistance from PT 2x10 Supine ER/IR rhythmic stabs at 30 deg 3x30 sec  Pt received R shoulder PROM in flexion, scaption, and ER in supine per pt and tissue tolerance.   08/30/23 Pt received R shoulder PROM in flexion, scaption, and ER in supine per pt and tissue tolerance.  Supine serratus punch 3x10 Supine rhythmic stab's (gently) at 90 deg flexion 3x30 sec Supine ER/IR rhythmic stabs at 30 deg 3x30 sec  Supine ER AROM 2x10 Standing forward punches 2x10 Standing row GTB with retraction 2 x 15 Standing shoulder extension with GTB 2x15 Standing ball rolls at table (green physioball) 2x10 in flexion and  scaption   08/26/23 Pulleys in flexion approx 15 and 10 reps, attempted scaption Standing ball rolls at table (green physioball) 2x10 in flexion and scaption Standing shoulder flexion in front of mirror (partial range) x10 Standing forward punches 2x10 Supine serratus punches 3x10 Supine ER AROM 2x10 Submaximal shoulder isometrics in standing with 5 sec hold in flexion, abd, and ER  Pt received R shoulder PROM in flexion, scaption, and ER per pt and tissue tolerance  08/21/2023 Pulleys in flexion approx 20 reps, scaption x 10 reps Standing ball rolls at table- flexion 2 x10 (green physioball), x10 scaption Supine serratus punches 3x10 Standing shoulder flexion in front of mirror (partial range) x10- cues for decreasing shoulder hike Standing forward punches x 10 Standing row GTB 2 x 15  See above for ROM and UEFI.      PATIENT EDUCATION: Education details: dx, HEP, POC, exercise form, and rationale of interventions.   Person educated: Patient Education method: Explanation, Demonstration, Tactile cues, Verbal cues, and Handouts Education comprehension: verbalized understanding, returned demonstration, verbal cues required, tactile cues required, and needs further education  HOME EXERCISE PROGRAM: Access Code: [email or web address] URL: [email or web address] Date: 07/11/2023 Prepared by: Mose Minerva  Exercises - Seated Scapular Retraction  - 2 x daily - 7 x weekly - 2 sets - 10 reps - 3 second hold - Seated Shoulder Flexion Towel Slide at Table Top  - 2 x daily - 7 x weekly - 2 sets - 10 reps  ASSESSMENT:  CLINICAL IMPRESSION:  Pt continues to experience crepitus throughout session. Encouraged pt to avoid pushing past pain limitations with exercises. Limited ROM due to degree of arthritis. Worked on [employer] functional strengthening in tolerable ranges and well as NMR to improve control with movements. Instructed pt in theracane use for home as she c/o tightness in  bilateral UT. Will continue to progress as tolerated as pt waits to hear about surgery.      OBJECTIVE IMPAIRMENTS: decreased activity tolerance, decreased ROM, decreased strength, hypomobility, impaired flexibility, impaired UE functional use, and pain.   ACTIVITY LIMITATIONS: bathing, dressing, reach over head, and hygiene/grooming  PARTICIPATION LIMITATIONS: meal prep, cleaning, and laundry  PERSONAL FACTORS: 1-2 comorbidities: RA and chronic back pain are also affecting patient's functional outcome.   REHAB POTENTIAL: Good  CLINICAL DECISION MAKING: Stable/uncomplicated  EVALUATION COMPLEXITY: Low   GOALS:  SHORT TERM GOALS: Target date:  07/31/23   Pt will be independent and compliant with HEP for improved pain, ROM, strength, and function.  Baseline: Goal status: GOAL MET  2.  Pt will demo at least a  30 deg increase in flexion, scaption, and abd AROM for improved shoulder mobility and stiffness.  Baseline:  Goal status: PROGRESSING  7/16  3.  Pt will report at least a 25% improvement in performance of ADL's/self care activities.  Baseline:  Goal status: GOAL MET  7/16  4.  Pt will be able to wash and dry her hair without significant difficulty.  Baseline:  Goal status: NOT MET Target date:  08/07/2023    LONG TERM GOALS: Target date:  09/26/23  Pt will be able to reach overhead into a cabinet without significant pain and difficulty.  Baseline:  Goal status: NOT MET  7/16  2.  Pt will be able to perform her ADLs and IADLs without significant difficulty and pain. Baseline:  Goal status: PROGRESSING  3.  Pt will demo improved R shoulder AROM to at least 130 deg in flexion and 50 deg in ER for improved reaching and performance of ADLs.  Baseline:  Goal status: NOT MET  4.  Pt will tolerate strengthening and stability exercises without adverse effects to improve R glenohumeral and scapular strength and stability to perform reaching activities, household chores,  and functional lifting activities.   Baseline:  Goal status: ONGOING    PLAN:  PT FREQUENCY: 2x/week  PT DURATION:  4-5 weeks  PLANNED INTERVENTIONS: [id number]- PT Re-evaluation, 97750- Physical Performance Testing, 97110-Therapeutic exercises, 97530- Therapeutic activity, [id number]- Neuromuscular re-education, 97535- Self Care, [id number]- Manual therapy, [id number]- Electrical stimulation (unattended), [id number]- Electrical stimulation (manual), [id number]- Ultrasound, Patient/Family education, Taping, Cryotherapy, and Moist heat  PLAN FOR NEXT SESSION: Cont with shoulder ROM, strengthening, and stability per pt tolerance.  Review and perform HEP.      Asberry Rodes, PTA  09/10/23 5:05 PM

## 2023-09-10 NOTE — Telephone Encounter (Signed)
-----   Message from [facility] sent at 09/09/2023  6:05 PM EDT ----- Regarding: LFTs results and ordering MRCP Hi,  I saw this patient today for cholelithiasis and elevated LFTs.  We got repeat labs today and her total bili is normal, but her AST, ALT, and alk phos remain elevated.  I discussed with the patient this afternoon that if her labs remained elevated, we should order an MRCP.  Can y'all please contact the patient to inform of results and that we should get an MRCP done please?  We can keep the follow up in 1 month for now as scheduled currently, but we'll update her on the results of the MRCP.  Thanks!  [employer]

## 2023-09-13 ENCOUNTER — Telehealth (HOSPITAL_BASED_OUTPATIENT_CLINIC_OR_DEPARTMENT_OTHER): Payer: Self-pay

## 2023-09-13 ENCOUNTER — Ambulatory Visit (HOSPITAL_BASED_OUTPATIENT_CLINIC_OR_DEPARTMENT_OTHER): Payer: Self-pay

## 2023-09-13 NOTE — Telephone Encounter (Signed)
 Called and spoke with pt regarding missed appt. Pt was on her way here thinking her appt was a 4pm while it was at 3:15. Pt has appt Monday and was reminded of time.

## 2023-09-14 ENCOUNTER — Other Ambulatory Visit: Payer: Self-pay | Admitting: Surgery

## 2023-09-14 ENCOUNTER — Ambulatory Visit
Admission: RE | Admit: 2023-09-14 | Discharge: 2023-09-14 | Disposition: A | Discharge status: Home / Self Care | Source: Ambulatory Visit | Attending: Surgery | Admitting: Surgery

## 2023-09-14 ENCOUNTER — Other Ambulatory Visit

## 2023-09-14 DIAGNOSIS — K8020 Calculus of gallbladder without cholecystitis without obstruction: Secondary | ICD-10-CM

## 2023-09-14 DIAGNOSIS — R7989 Other specified abnormal findings of blood chemistry: Secondary | ICD-10-CM | POA: Diagnosis not present

## 2023-09-14 DIAGNOSIS — K828 Other specified diseases of gallbladder: Secondary | ICD-10-CM | POA: Diagnosis not present

## 2023-09-14 DIAGNOSIS — K7689 Other specified diseases of liver: Secondary | ICD-10-CM | POA: Diagnosis not present

## 2023-09-14 MED ORDER — GADOBUTROL 1 MMOL/ML IV SOLN
10.0000 mL | Freq: Once | INTRAVENOUS | Status: AC | PRN
  Administered 2023-09-14: 10 mL via INTRAVENOUS

## 2023-09-16 ENCOUNTER — Encounter (HOSPITAL_BASED_OUTPATIENT_CLINIC_OR_DEPARTMENT_OTHER): Payer: Self-pay | Admitting: Physical Therapy

## 2023-09-16 ENCOUNTER — Ambulatory Visit (HOSPITAL_BASED_OUTPATIENT_CLINIC_OR_DEPARTMENT_OTHER): Admitting: Physical Therapy

## 2023-09-16 DIAGNOSIS — M25511 Pain in right shoulder: Secondary | ICD-10-CM

## 2023-09-16 DIAGNOSIS — M9903 Segmental and somatic dysfunction of lumbar region: Secondary | ICD-10-CM | POA: Diagnosis not present

## 2023-09-16 DIAGNOSIS — M9902 Segmental and somatic dysfunction of thoracic region: Secondary | ICD-10-CM | POA: Diagnosis not present

## 2023-09-16 DIAGNOSIS — M6281 Muscle weakness (generalized): Secondary | ICD-10-CM | POA: Diagnosis not present

## 2023-09-16 DIAGNOSIS — M9904 Segmental and somatic dysfunction of sacral region: Secondary | ICD-10-CM | POA: Diagnosis not present

## 2023-09-16 DIAGNOSIS — M9901 Segmental and somatic dysfunction of cervical region: Secondary | ICD-10-CM | POA: Diagnosis not present

## 2023-09-16 DIAGNOSIS — M25611 Stiffness of right shoulder, not elsewhere classified: Secondary | ICD-10-CM

## 2023-09-16 NOTE — Therapy (Signed)
 OUTPATIENT PHYSICAL THERAPY SHOULDER TREATMENT   Patient Name: GIULLIANA MCROBERTS MRN: [id number] DOB:12/30/1950, 73 y.o., female Today's Date: 09/16/2023  END OF SESSION:  PT End of Session - 09/16/23 1105     Visit Number 17    Number of Visits 20    Date for PT Re-Evaluation 09/26/23    Authorization Type human[employer] MCR    Authorization Time Period 07.28.2025 - 08.30.2025    PT Start Time 1022    PT Stop Time 1104    PT Time Calculation (min) 42 min    Activity Tolerance Patient tolerated treatment well    Behavior During Therapy WFL for tasks assessed/performed                     Past Medical History:  Diagnosis Date   Allergy    Arthritis    Coronary artery disease    Fatty liver    GERD (gastroesophageal reflux disease)    Heart murmur    Hypertension    Hx   LIVER FUNCTION TESTS, ABNORMAL, HX OF 09/08/2007   Qualifier: Diagnosis of  By: Nelson-Smith CMA (AAMA), Dottie     Neuromuscular disorder (HCC)    Essential Tremor   Overactive bladder    Past Surgical History:  Procedure Laterality Date   ABDOMINAL HYSTERECTOMY     COLONOSCOPY WITH PROPOFOL  N/A 09/04/2018   Procedure: COLONOSCOPY WITH PROPOFOL ;  Surgeon: Janalyn Keene NOVAK, MD;  Location: [facility] END[facility]COPY;  Service: Endoscopy;  Laterality: N/A;   ESOPHAGOGASTRODUODENOSCOPY (EGD) WITH PROPOFOL  N/A 09/04/2018   Procedure: ESOPHAGOGASTRODUODENOSCOPY (EGD) WITH PROPOFOL ;  Surgeon: Janalyn Keene NOVAK, MD;  Location: [facility] END[facility]COPY;  Service: Endoscopy;  Laterality: N/A;   LAPAROSCOPY     for endometriosis x2   MOUTH SURGERY  04/2023   tooth extraction, gum graft   TONSILLECTOMY     TOTAL HIP ARTHROPLASTY Right 05/15/2022   Procedure: RIGHT TOTAL HIP ARTHROPLASTY ANTERIOR APPROACH;  Surgeon: Vernetta Lonni GRADE, MD;  Location: [facility] OR;  Service: Orthopedics;  Laterality: Right;   TOTAL KNEE ARTHROPLASTY Left 07/18/2021   Procedure: TOTAL KNEE ARTHROPLASTY;  Surgeon: Ernie Cough, MD;   Location: [facility]S;  Service: Orthopedics;  Laterality: Left;   TURBINATE REDUCTION     Patient Active Problem List   Diagnosis Date Noted   Protracted URI 05/03/2023   Acute non-recurrent pansinusitis 02/04/2023   OSA (obstructive sleep apnea) 11/14/2022   Arthritis 11/14/2022   Nocturia 10/05/2022   Hypersomnia 10/05/2022   S/P total hip arthroplasty 05/17/2022   Status post total replacement of right hip 05/15/2022   Overactive bladder 04/04/2022   Postmenopausal estrogen deficiency 11/13/2021   Primary osteoarthritis 11/13/2021   Leg length inequality 11/06/2021   Impaired ambulation 10/23/2021   Contact dermatitis 10/16/2021   SI joint arthritis (HCC) 08/24/2021   Spinal stenosis, lumbar region, without neurogenic claudication 08/24/2021   Degeneration of lumbar intervertebral disc 07/24/2021   S/P total knee arthroplasty, left 07/18/2021   Preop examination 07/07/2021   Right hip pain 07/07/2021   Low back pain 07/07/2021   Drug-induced immunodeficiency (HCC) 06/12/2021   Impingement syndrome of right shoulder region 06/12/2021   COVID-19 01/24/2021   Heart murmur 11/24/2020   Hammer toe 04/22/2020   Metatarsalgia of left foot 04/22/2020   Osteoarthritis of midfoot 04/22/2020   Duodenal ulcer 11/12/2019   Stress 11/12/2019   Morbid obesity (HCC) 11/06/2019   Pain in left knee 07/30/2019   Aortic stenosis, moderate 02/06/2018   Chronic diarrhea 10/09/2017  Acid reflux 07/15/2017   Asymptomatic carotid artery stenosis 07/15/2017   Cyst of skin 07/15/2017   Right maxillary sinusitis 11/09/2016   Immunocompromised (HCC) 11/09/2016   Allergic rhinitis 11/02/2016   Benign essential tremor 11/02/2016   History of hypertension 11/02/2016   Postmenopausal symptoms 07/01/2015   Fatty liver 09/08/2007   Rheumatoid arthritis (HCC) 09/08/2007     REFERRING PROVIDER: Carilyn Prentice BRAVO, MD  REFERRING DIAG: [phone] (ICD-10-CM) - Chronic right shoulder  pain  THERAPY DIAG:  Stiffness of right shoulder, not elsewhere classified  Right shoulder pain, unspecified chronicity  Muscle weakness (generalized)  Rationale for Evaluation and Treatment: Rehabilitation  ONSET DATE: Late 2024/January 2025  SUBJECTIVE:                                                                                                                                                                                      SUBJECTIVE STATEMENT:  Pt states she felt pretty good after prior Rx.  Pt has been getting shockwave therapy treatments close to a month and feels that it is helping.  Pt is planning on having shoulder surgery.        Hand dominance: Right  PERTINENT HISTORY: Chronic LBP, multilevel DDD, SI pain R THA 04/24 L TKA 06/23 RA Right Knee End-stage OA  Bladder incontinence    PAIN:  Location: 1/10 current, 7/10 worst, 0/10 best   PRECAUTIONS: RA, R THA, L TKA  RED FLAGS: Bowel or bladder incontinence: Yes:     WEIGHT BEARING RESTRICTIONS: No  FALLS:  Has patient fallen in last 6 months? No  LIVING ENVIRONMENT: Lives with: lives alone Lives in: 2 story home though mainly lives on the 1st floor Stairs: yes Has following equipment at home: chair lift on stairs, walking stick, FWW  OCCUPATION: retired  PLOF: Independent  PATIENT GOALS:to get rid of pain in shoulder and use her R arm.  NEXT MD VISIT:   OBJECTIVE:  Note: Objective measures were completed at Evaluation unless otherwise noted.  DIAGNOSTIC FINDINGS:  R shoulder x ray:   FINDINGS: There is diffuse decreased bone mineralization. Severe glenohumeral joint space narrowing with bone-on-bone contact, subchondral sclerosis, subchondral cystic change, and moderate superomedial humeral head and glenoid fossa cortical flattening/remodeling.   Mild acromioclavicular joint space narrowing and peripheral osteophytosis.   No acute fracture or dislocation.   The visualized  portion of the right lung is unremarkable.   IMPRESSION: 1. Severe glenohumeral osteoarthritis. 2. Mild acromioclavicular osteoarthritis.   PATIENT SURVEYS:  UEFI: Initial/Current: 20/80  /  31/80  COGNITION: Overall cognitive status: Within functional limits for tasks assessed      UPPER EXTREMITY ROM:  AROM/PROM Right eval Left eval Right 7/16  Shoulder flexion 62 with pain 152 78 with tightness and little pain  AAROM: (supine) 112  PROM:  116  Shoulder scaption 52 with pain 155 72 with pain  Shoulder abduction 38 with pain 138 64 with some pain  Shoulder adduction     Shoulder internal rotation 47/44    Shoulder external rotation 24/22   16/18  Elbow flexion     Elbow extension     Wrist flexion     Wrist extension     Wrist ulnar deviation     Wrist radial deviation     Wrist pronation     Wrist supination     (Blank rows = not tested)  UPPER EXTREMITY MMT:  MMT Right eval Left eval  Shoulder flexion  5/5  Shoulder extension    Shoulder abduction  4+/5  Shoulder adduction    Shoulder internal rotation Tolerated min to mod resistance with pain WFL in sitting  Shoulder external rotation Tolerated min resistance with pain Tolerated mod resistance in sitting  Middle trapezius    Lower trapezius    Elbow flexion    Elbow extension    Wrist flexion    Wrist extension    Wrist ulnar deviation    Wrist radial deviation    Wrist pronation    Wrist supination    Grip strength (lbs)    (Blank rows = not tested)  SHOULDER SPECIAL TESTS: ER lag:  negative with arm at side                                                                                                                             TREATMENT:   09/16/23 Pulleys in flexion 2x15 and scaption x 15  Standing forward punch 3x10 Supine flexion AAROM with manual assist 2x10 Supine punches 2x10  Pt received R shoulder PROM in flexion, scaption, and ER per pt and tissue tolerance.   Supine  ER AROM 2x10 ER/IR rhythmic stab's at 30 and 50 deg abd in supine 2x30 sec each Seated cane flexion x10, 5 reps Standing ball rolls at table (green physioball) x10 in flexion and scaption   09/10/23 Standing ball rolls at table (green physioball) 2x10 in flexion and scaption Pulleys in flexion Standing forward punches 3x10 Standing row GTB with retraction 2 x 15 Standing shoulder extension with GTB 2x15 Well arm assist supine flexion x10 Supine flexion with manual assistance from PTA x10 Seated chicken wing x10 (had pain by end of 10) Cone reaches multidirection Seated cane press out x10 Seated cane flexion  Pt received R shoulder PROM in flexion, scaption, and ER in supine per pt and tissue tolerance. GHJ mobilizations Grade II posterior and inferior glides Instruction in theracane    09/05/23 Standing ball rolls at table (green physioball) 2x10 in flexion and scaption Pulleys in flexion Standing forward punches 3x10 Standing row GTB with retraction 2 x 15 Standing shoulder extension  with GTB 2x15 Supine flexion with manual assistance from PT 2x10   Pt received R shoulder PROM in flexion, scaption, and ER in supine per pt and tissue tolerance.   09/02/23 Standing ball rolls at table (green physioball) 2x10 in flexion and scaption Pulleys in flexion Standing forward punches 3x10 Standing row GTB with retraction 2 x 15 Standing shoulder extension with GTB 2x15 Supine serratus punch 3x10 Supine rhythmic stab's at 90 deg flexion gently 3x30 sec Supine ER AROM 2x10 Supine flexion with manual assistance from PT 2x10 Supine ER/IR rhythmic stabs at 30 deg 3x30 sec  Pt received R shoulder PROM in flexion, scaption, and ER in supine per pt and tissue tolerance.   08/30/23 Pt received R shoulder PROM in flexion, scaption, and ER in supine per pt and tissue tolerance.  Supine serratus punch 3x10 Supine rhythmic stab's (gently) at 90 deg flexion 3x30 sec Supine ER/IR rhythmic  stabs at 30 deg 3x30 sec  Supine ER AROM 2x10 Standing forward punches 2x10 Standing row GTB with retraction 2 x 15 Standing shoulder extension with GTB 2x15 Standing ball rolls at table (green physioball) 2x10 in flexion and scaption   08/26/23 Pulleys in flexion approx 15 and 10 reps, attempted scaption Standing ball rolls at table (green physioball) 2x10 in flexion and scaption Standing shoulder flexion in front of mirror (partial range) x10 Standing forward punches 2x10 Supine serratus punches 3x10 Supine ER AROM 2x10 Submaximal shoulder isometrics in standing with 5 sec hold in flexion, abd, and ER  Pt received R shoulder PROM in flexion, scaption, and ER per pt and tissue tolerance     PATIENT EDUCATION: Education details: dx, HEP, POC, exercise form, and rationale of interventions.   Person educated: Patient Education method: Explanation, Demonstration, Tactile cues, Verbal cues, and Handouts Education comprehension: verbalized understanding, returned demonstration, verbal cues required, tactile cues required, and needs further education  HOME EXERCISE PROGRAM: Access Code: [email or web address] URL: [email or web address] Date: 07/11/2023 Prepared by: Mose Minerva  Exercises - Seated Scapular Retraction  - 2 x daily - 7 x weekly - 2 sets - 10 reps - 3 second hold - Seated Shoulder Flexion Towel Slide at Table Top  - 2 x daily - 7 x weekly - 2 sets - 10 reps  ASSESSMENT:  CLINICAL IMPRESSION:  Pt continues to be very limited with UE elevation and reaching.  She gives great effort with all exercises and activities.  Pt is very limited with PROM also due to arthritis and associated pain and stiffness.  PT worked on [employer] and AROM w/n tolerable ranges to improve reaching and UE elevation.  She responded well to Rx and reports increased pain from 1/10 before Rx to 2/10 after Rx.      OBJECTIVE IMPAIRMENTS: decreased activity tolerance, decreased ROM, decreased  strength, hypomobility, impaired flexibility, impaired UE functional use, and pain.   ACTIVITY LIMITATIONS: bathing, dressing, reach over head, and hygiene/grooming  PARTICIPATION LIMITATIONS: meal prep, cleaning, and laundry  PERSONAL FACTORS: 1-2 comorbidities: RA and chronic back pain are also affecting patient's functional outcome.   REHAB POTENTIAL: Good  CLINICAL DECISION MAKING: Stable/uncomplicated  EVALUATION COMPLEXITY: Low   GOALS:  SHORT TERM GOALS: Target date:  07/31/23   Pt will be independent and compliant with HEP for improved pain, ROM, strength, and function.  Baseline: Goal status: GOAL MET  2.  Pt will demo at least a 30 deg increase in flexion, scaption, and abd AROM for improved shoulder mobility and stiffness.  Baseline:  Goal  status: PROGRESSING  7/16  3.  Pt will report at least a 25% improvement in performance of ADL's/self care activities.  Baseline:  Goal status: GOAL MET  7/16  4.  Pt will be able to wash and dry her hair without significant difficulty.  Baseline:  Goal status: NOT MET Target date:  08/07/2023    LONG TERM GOALS: Target date:  09/26/23  Pt will be able to reach overhead into a cabinet without significant pain and difficulty.  Baseline:  Goal status: NOT MET  7/16  2.  Pt will be able to perform her ADLs and IADLs without significant difficulty and pain. Baseline:  Goal status: PROGRESSING  3.  Pt will demo improved R shoulder AROM to at least 130 deg in flexion and 50 deg in ER for improved reaching and performance of ADLs.  Baseline:  Goal status: NOT MET  4.  Pt will tolerate strengthening and stability exercises without adverse effects to improve R glenohumeral and scapular strength and stability to perform reaching activities, household chores, and functional lifting activities.   Baseline:  Goal status: ONGOING    PLAN:  PT FREQUENCY: 2x/week  PT DURATION:  4-5 weeks  PLANNED INTERVENTIONS: [id number]- PT  Re-evaluation, 97750- Physical Performance Testing, 97110-Therapeutic exercises, 97530- Therapeutic activity, [id number]- Neuromuscular re-education, 97535- Self Care, [id number]- Manual therapy, [id number]- Electrical stimulation (unattended), [id number]- Electrical stimulation (manual), [id number]- Ultrasound, Patient/Family education, Taping, Cryotherapy, and Moist heat  PLAN FOR NEXT SESSION: Cont with shoulder ROM, strengthening, and stability per pt tolerance.  Review and perform HEP.      Leigh Minerva III PT, DPT 09/16/23 5:26 PM

## 2023-09-17 DIAGNOSIS — M150 Primary generalized (osteo)arthritis: Secondary | ICD-10-CM | POA: Diagnosis not present

## 2023-09-17 DIAGNOSIS — R7989 Other specified abnormal findings of blood chemistry: Secondary | ICD-10-CM | POA: Diagnosis not present

## 2023-09-17 DIAGNOSIS — M0589 Other rheumatoid arthritis with rheumatoid factor of multiple sites: Secondary | ICD-10-CM | POA: Diagnosis not present

## 2023-09-17 DIAGNOSIS — Z79899 Other long term (current) drug therapy: Secondary | ICD-10-CM | POA: Diagnosis not present

## 2023-09-19 ENCOUNTER — Encounter (HOSPITAL_BASED_OUTPATIENT_CLINIC_OR_DEPARTMENT_OTHER)

## 2023-09-19 ENCOUNTER — Encounter: Payer: Self-pay | Admitting: Cardiology

## 2023-09-19 ENCOUNTER — Other Ambulatory Visit: Payer: Self-pay

## 2023-09-19 ENCOUNTER — Encounter
Admission: RE | Disposition: A | Discharge status: Home / Self Care | Payer: Self-pay | Source: Home / Self Care | Attending: Cardiology

## 2023-09-19 ENCOUNTER — Ambulatory Visit
Admission: RE | Admit: 2023-09-19 | Discharge: 2023-09-19 | Disposition: A | Discharge status: Home / Self Care | Attending: Cardiology | Admitting: Cardiology

## 2023-09-19 DIAGNOSIS — R0609 Other forms of dyspnea: Secondary | ICD-10-CM | POA: Insufficient documentation

## 2023-09-19 DIAGNOSIS — I350 Nonrheumatic aortic (valve) stenosis: Secondary | ICD-10-CM | POA: Insufficient documentation

## 2023-09-19 DIAGNOSIS — I2510 Atherosclerotic heart disease of native coronary artery without angina pectoris: Secondary | ICD-10-CM | POA: Diagnosis not present

## 2023-09-19 HISTORY — PX: RIGHT/LEFT HEART CATH AND CORONARY ANGIOGRAPHY: CATH118266

## 2023-09-19 LAB — POCT I-STAT 7, (LYTES, BLD GAS, ICA,H+H)
Acid-base deficit: 1 mmol/L (ref 0.0–2.0)
Bicarbonate: 23.7 mmol/L (ref 20.0–28.0)
Calcium, Ion: 1.17 mmol/L (ref 1.15–1.40)
HCT: 38 % (ref 36.0–46.0)
Hemoglobin: 12.9 g/dL (ref 12.0–15.0)
O2 Saturation: 95 %
Potassium: 3.5 mmol/L (ref 3.5–5.1)
Sodium: 143 mmol/L (ref 135–145)
TCO2: 25 mmol/L (ref 22–32)
pCO2 arterial: 39.7 mmHg (ref 32–48)
pH, Arterial: 7.384 (ref 7.35–7.45)
pO2, Arterial: 76 mmHg — ABNORMAL LOW (ref 83–108)

## 2023-09-19 LAB — POCT I-STAT EG7
Acid-Base Excess: 0 mmol/L (ref 0.0–2.0)
Bicarbonate: 25.8 mmol/L (ref 20.0–28.0)
Calcium, Ion: 1.22 mmol/L (ref 1.15–1.40)
HCT: 39 % (ref 36.0–46.0)
Hemoglobin: 13.3 g/dL (ref 12.0–15.0)
O2 Saturation: 74 %
Potassium: 3.6 mmol/L (ref 3.5–5.1)
Sodium: 142 mmol/L (ref 135–145)
TCO2: 27 mmol/L (ref 22–32)
pCO2, Ven: 44.1 mmHg (ref 44–60)
pH, Ven: 7.376 (ref 7.25–7.43)
pO2, Ven: 40 mmHg (ref 32–45)

## 2023-09-19 SURGERY — RIGHT/LEFT HEART CATH AND CORONARY ANGIOGRAPHY
Anesthesia: Moderate Sedation | Laterality: Bilateral

## 2023-09-19 MED ORDER — SODIUM CHLORIDE 0.9 % IV SOLN: 250.0000 mL | INTRAVENOUS | Status: DC | PRN

## 2023-09-19 MED ORDER — OXYCODONE-ACETAMINOPHEN 5-325 MG PO TABS
ORAL_TABLET | ORAL | Status: AC
Start: 2023-09-19 — End: 2023-09-19
  Filled 2023-09-19: qty 1

## 2023-09-19 MED ORDER — OXYCODONE-ACETAMINOPHEN 5-325 MG PO TABS
2.0000 | ORAL_TABLET | ORAL | Status: DC | PRN
  Administered 2023-09-19: 1 via ORAL

## 2023-09-19 MED ORDER — FREE WATER: 500.0000 mL | Freq: Once | Status: DC

## 2023-09-19 MED ORDER — LIDOCAINE HCL 1 % IJ SOLN
INTRAMUSCULAR | Status: AC
  Filled 2023-09-19: qty 20

## 2023-09-19 MED ORDER — IOHEXOL 300 MG/ML  SOLN
INTRAMUSCULAR | Status: DC | PRN
  Administered 2023-09-19: 92 mL

## 2023-09-19 MED ORDER — METOPROLOL TARTRATE 5 MG/5ML IV SOLN
INTRAVENOUS | Status: AC
Start: 2023-09-19 — End: 2023-09-19
  Filled 2023-09-19: qty 5

## 2023-09-19 MED ORDER — ONDANSETRON HCL 4 MG/2ML IJ SOLN: 4.0000 mg | Freq: Four times a day (QID) | INTRAMUSCULAR | Status: DC | PRN

## 2023-09-19 MED ORDER — LABETALOL HCL 5 MG/ML IV SOLN: 10.0000 mg | INTRAVENOUS | Status: DC | PRN

## 2023-09-19 MED ORDER — ASPIRIN 81 MG PO CHEW
81.0000 mg | CHEWABLE_TABLET | ORAL | Status: AC
  Administered 2023-09-19: 81 mg via ORAL

## 2023-09-19 MED ORDER — SODIUM CHLORIDE 0.9% FLUSH: 3.0000 mL | INTRAVENOUS | Status: DC | PRN

## 2023-09-19 MED ORDER — SODIUM CHLORIDE 0.9% FLUSH: 3.0000 mL | Freq: Two times a day (BID) | INTRAVENOUS | Status: DC

## 2023-09-19 MED ORDER — HEPARIN (PORCINE) IN NACL 1000-0.9 UT/500ML-% IV SOLN
INTRAVENOUS | Status: AC
  Filled 2023-09-19: qty 1000

## 2023-09-19 MED ORDER — FREE WATER: 250.0000 mL | Freq: Once | Status: DC

## 2023-09-19 MED ORDER — FENTANYL CITRATE (PF) 100 MCG/2ML IJ SOLN
INTRAMUSCULAR | Status: AC
Start: 2023-09-19 — End: 2023-09-19
  Filled 2023-09-19: qty 2

## 2023-09-19 MED ORDER — MIDAZOLAM HCL 2 MG/2ML IJ SOLN
INTRAMUSCULAR | Status: AC
  Filled 2023-09-19: qty 2

## 2023-09-19 MED ORDER — ASPIRIN 81 MG PO CHEW
CHEWABLE_TABLET | ORAL | Status: AC
  Filled 2023-09-19: qty 1

## 2023-09-19 MED ORDER — METOPROLOL TARTRATE 5 MG/5ML IV SOLN
INTRAVENOUS | Status: DC | PRN
  Administered 2023-09-19: 5 mg via INTRAVENOUS

## 2023-09-19 MED ORDER — MIDAZOLAM HCL 2 MG/2ML IJ SOLN
INTRAMUSCULAR | Status: DC | PRN
  Administered 2023-09-19: 1 mg via INTRAVENOUS

## 2023-09-19 MED ORDER — ACETAMINOPHEN 325 MG PO TABS: 650.0000 mg | ORAL_TABLET | ORAL | Status: DC | PRN

## 2023-09-19 MED ORDER — HYDRALAZINE HCL 20 MG/ML IJ SOLN: 10.0000 mg | INTRAMUSCULAR | Status: DC | PRN

## 2023-09-19 MED ORDER — SODIUM CHLORIDE 0.9 % IV SOLN
250.0000 mL | INTRAVENOUS | Status: DC | PRN
Start: 2023-09-19 — End: 2023-09-19
  Administered 2023-09-19: 250 mL via INTRAVENOUS

## 2023-09-19 MED ORDER — LIDOCAINE HCL (PF) 1 % IJ SOLN
INTRAMUSCULAR | Status: DC | PRN
  Administered 2023-09-19 (×2): 2 mL

## 2023-09-19 MED ORDER — HEPARIN (PORCINE) IN NACL 1000-0.9 UT/500ML-% IV SOLN
INTRAVENOUS | Status: DC | PRN
Start: 2023-09-19 — End: 2023-09-19
  Administered 2023-09-19: 1000 mL

## 2023-09-19 MED ORDER — HEPARIN (PORCINE) IN NACL 1000-0.9 UT/500ML-% IV SOLN
INTRAVENOUS | Status: AC
  Filled 2023-09-19: qty 500

## 2023-09-19 MED ORDER — FENTANYL CITRATE (PF) 100 MCG/2ML IJ SOLN
INTRAMUSCULAR | Status: DC | PRN
  Administered 2023-09-19: 25 ug via INTRAVENOUS

## 2023-09-19 SURGICAL SUPPLY — 16 items
CATH INFINITI 5FR JL4 (CATHETERS) IMPLANT
CATH INFINITI JR4 5F (CATHETERS) IMPLANT
CATH LANGSTON DUAL LUM PIG 6FR (CATHETERS) IMPLANT
CATH SWAN GANZ 7F STRAIGHT (CATHETERS) IMPLANT
DEVICE CLOSURE MYNXGRIP 6/7F (Vascular Products) IMPLANT
KIT RIGHT HEART ACIST (MISCELLANEOUS) IMPLANT
NDL PERC 18GX7CM (NEEDLE) IMPLANT
NEEDLE PERC 18GX7CM (NEEDLE) ×1 IMPLANT
PACK CARDIAC CATH (CUSTOM PROCEDURE TRAY) ×1 IMPLANT
SET ATX-X65L (MISCELLANEOUS) IMPLANT
SHEATH AVANTI 6FR X 11CM (SHEATH) IMPLANT
SHEATH AVANTI 7FRX11 (SHEATH) IMPLANT
STATION PROTECTION PRESSURIZED (MISCELLANEOUS) IMPLANT
WIRE EMERALD 3MM-J .035X150CM (WIRE) IMPLANT
WIRE EMERALD 3MM-J .035X260CM (WIRE) IMPLANT
WIRE EMERALD ST .035X150CM (WIRE) IMPLANT

## 2023-09-19 NOTE — Discharge Instructions (Signed)

## 2023-09-23 DIAGNOSIS — M9904 Segmental and somatic dysfunction of sacral region: Secondary | ICD-10-CM | POA: Diagnosis not present

## 2023-09-23 DIAGNOSIS — M9901 Segmental and somatic dysfunction of cervical region: Secondary | ICD-10-CM | POA: Diagnosis not present

## 2023-09-23 DIAGNOSIS — M9903 Segmental and somatic dysfunction of lumbar region: Secondary | ICD-10-CM | POA: Diagnosis not present

## 2023-09-23 DIAGNOSIS — M9902 Segmental and somatic dysfunction of thoracic region: Secondary | ICD-10-CM | POA: Diagnosis not present

## 2023-09-24 ENCOUNTER — Ambulatory Visit (HOSPITAL_BASED_OUTPATIENT_CLINIC_OR_DEPARTMENT_OTHER): Payer: Self-pay

## 2023-09-24 ENCOUNTER — Encounter (HOSPITAL_BASED_OUTPATIENT_CLINIC_OR_DEPARTMENT_OTHER): Payer: Self-pay

## 2023-09-24 DIAGNOSIS — M25511 Pain in right shoulder: Secondary | ICD-10-CM | POA: Diagnosis not present

## 2023-09-24 DIAGNOSIS — M6281 Muscle weakness (generalized): Secondary | ICD-10-CM

## 2023-09-24 DIAGNOSIS — M25611 Stiffness of right shoulder, not elsewhere classified: Secondary | ICD-10-CM

## 2023-09-24 NOTE — Therapy (Signed)
 OUTPATIENT PHYSICAL THERAPY SHOULDER TREATMENT   Patient Name: Wanda Lang MRN: [id number] DOB:1950/12/21, 73 y.o., female Today's Date: 09/24/2023  END OF SESSION:  PT End of Session - 09/24/23 1310     Visit Number 18    Number of Visits 20    Date for PT Re-Evaluation 09/26/23    Authorization Type human[employer] MCR    Authorization Time Period 07.28.2025 - 08.30.2025    Authorization - Visit Number 5    Authorization - Number of Visits 8    Progress Note Due on Visit 19    PT Start Time 1305    PT Stop Time 1345    PT Time Calculation (min) 40 min    Activity Tolerance Patient tolerated treatment well    Behavior During Therapy WFL for tasks assessed/performed                      Past Medical History:  Diagnosis Date   Allergy    Arthritis    Coronary artery disease    Fatty liver    GERD (gastroesophageal reflux disease)    Heart murmur    Hypertension    Hx   LIVER FUNCTION TESTS, ABNORMAL, HX OF 09/08/2007   Qualifier: Diagnosis of  By: Nelson-Smith CMA (AAMA), Dottie     Neuromuscular disorder (HCC)    Essential Tremor   Overactive bladder    Past Surgical History:  Procedure Laterality Date   ABDOMINAL HYSTERECTOMY     COLONOSCOPY WITH PROPOFOL  N/A 09/04/2018   Procedure: COLONOSCOPY WITH PROPOFOL ;  Surgeon: Janalyn Keene NOVAK, MD;  Location: [facility];  Service: Endoscopy;  Laterality: N/A;   ESOPHAGOGASTRODUODENOSCOPY (EGD) WITH PROPOFOL  N/A 09/04/2018   Procedure: ESOPHAGOGASTRODUODENOSCOPY (EGD) WITH PROPOFOL ;  Surgeon: Janalyn Keene NOVAK, MD;  Location: [facility];  Service: Endoscopy;  Laterality: N/A;   LAPAROSCOPY     for endometriosis x2   MOUTH SURGERY  04/2023   tooth extraction, gum graft   RIGHT/LEFT HEART CATH AND CORONARY ANGIOGRAPHY Bilateral 09/19/2023   Procedure: RIGHT/LEFT HEART CATH AND CORONARY ANGIOGRAPHY;  Surgeon: Ammon Blunt, MD;  Location: [facility];  Service: Cardiovascular;   Laterality: Bilateral;   TONSILLECTOMY     TOTAL HIP ARTHROPLASTY Right 05/15/2022   Procedure: RIGHT TOTAL HIP ARTHROPLASTY ANTERIOR APPROACH;  Surgeon: Vernetta Lonni GRADE, MD;  Location: [facility];  Service: Orthopedics;  Laterality: Right;   TOTAL KNEE ARTHROPLASTY Left 07/18/2021   Procedure: TOTAL KNEE ARTHROPLASTY;  Surgeon: Ernie Cough, MD;  Location: [facility];  Service: Orthopedics;  Laterality: Left;   TURBINATE REDUCTION     Patient Active Problem List   Diagnosis Date Noted   Protracted URI 05/03/2023   Acute non-recurrent pansinusitis 02/04/2023   OSA (obstructive sleep apnea) 11/14/2022   Arthritis 11/14/2022   Nocturia 10/05/2022   Hypersomnia 10/05/2022   S/P total hip arthroplasty 05/17/2022   Status post total replacement of right hip 05/15/2022   Overactive bladder 04/04/2022   Postmenopausal estrogen deficiency 11/13/2021   Primary osteoarthritis 11/13/2021   Leg length inequality 11/06/2021   Impaired ambulation 10/23/2021   Contact dermatitis 10/16/2021   SI joint arthritis (HCC) 08/24/2021   Spinal stenosis, lumbar region, without neurogenic claudication 08/24/2021   Degeneration of lumbar intervertebral disc 07/24/2021   S/P total knee arthroplasty, left 07/18/2021   Preop examination 07/07/2021   Right hip pain 07/07/2021   Low back pain 07/07/2021   Drug-induced immunodeficiency (HCC) 06/12/2021   Impingement syndrome of right shoulder region  06/12/2021   COVID-19 01/24/2021   Heart murmur 11/24/2020   Hammer toe 04/22/2020   Metatarsalgia of left foot 04/22/2020   Osteoarthritis of midfoot 04/22/2020   Duodenal ulcer 11/12/2019   Stress 11/12/2019   Morbid obesity (HCC) 11/06/2019   Pain in left knee 07/30/2019   Aortic stenosis, moderate 02/06/2018   Chronic diarrhea 10/09/2017   Acid reflux 07/15/2017   Asymptomatic carotid artery stenosis 07/15/2017   Cyst of skin 07/15/2017   Right maxillary sinusitis 11/09/2016   Immunocompromised (HCC)  11/09/2016   Allergic rhinitis 11/02/2016   Benign essential tremor 11/02/2016   History of hypertension 11/02/2016   Postmenopausal symptoms 07/01/2015   Fatty liver 09/08/2007   Rheumatoid arthritis (HCC) 09/08/2007     REFERRING PROVIDER: Carilyn Prentice BRAVO, MD  REFERRING DIAG: [phone] (ICD-10-CM) - Chronic right shoulder pain  THERAPY DIAG:  Stiffness of right shoulder, not elsewhere classified  Right shoulder pain, unspecified chronicity  Muscle weakness (generalized)  Rationale for Evaluation and Treatment: Rehabilitation  ONSET DATE: Late 2024/January 2025  SUBJECTIVE:                                                                                                                                                                                      SUBJECTIVE STATEMENT:  Pt had heart cath procedure last Thursday and is sore in her R hip. Results came back clear for coronary arteries but has aortic  valve stenosis and will be referred to a surgeon.  Shoulder feels a little aggravated lately, but no pain at entry.        Hand dominance: Right  PERTINENT HISTORY: Chronic LBP, multilevel DDD, SI pain R THA 04/24 L TKA 06/23 RA Right Knee End-stage OA  Bladder incontinence    PAIN:  Location: 0/10 current, 7/10 worst, 0/10 best   PRECAUTIONS: RA, R THA, L TKA  RED FLAGS: Bowel or bladder incontinence: Yes:     WEIGHT BEARING RESTRICTIONS: No  FALLS:  Has patient fallen in last 6 months? No  LIVING ENVIRONMENT: Lives with: lives alone Lives in: 2 story home though mainly lives on the 1st floor Stairs: yes Has following equipment at home: chair lift on stairs, walking stick, FWW  OCCUPATION: retired  PLOF: Independent  PATIENT GOALS:to get rid of pain in shoulder and use her R arm.  NEXT MD VISIT:   OBJECTIVE:  Note: Objective measures were completed at Evaluation unless otherwise noted.  DIAGNOSTIC FINDINGS:  R shoulder x ray:    FINDINGS: There is diffuse decreased bone mineralization. Severe glenohumeral joint space narrowing with bone-on-bone contact, subchondral sclerosis, subchondral cystic change, and moderate superomedial humeral head and glenoid fossa  cortical flattening/remodeling.   Mild acromioclavicular joint space narrowing and peripheral osteophytosis.   No acute fracture or dislocation.   The visualized portion of the right lung is unremarkable.   IMPRESSION: 1. Severe glenohumeral osteoarthritis. 2. Mild acromioclavicular osteoarthritis.   PATIENT SURVEYS:  UEFI: Initial/Current: 20/80  /  31/80  COGNITION: Overall cognitive status: Within functional limits for tasks assessed      UPPER EXTREMITY ROM:   AROM/PROM Right eval Left eval Right 7/16  Shoulder flexion 62 with pain 152 78 with tightness and little pain  AAROM: (supine) 112  PROM:  116  Shoulder scaption 52 with pain 155 72 with pain  Shoulder abduction 38 with pain 138 64 with some pain  Shoulder adduction     Shoulder internal rotation 47/44    Shoulder external rotation 24/22   16/18  Elbow flexion     Elbow extension     Wrist flexion     Wrist extension     Wrist ulnar deviation     Wrist radial deviation     Wrist pronation     Wrist supination     (Blank rows = not tested)  UPPER EXTREMITY MMT:  MMT Right eval Left eval  Shoulder flexion  5/5  Shoulder extension    Shoulder abduction  4+/5  Shoulder adduction    Shoulder internal rotation Tolerated min to mod resistance with pain WFL in sitting  Shoulder external rotation Tolerated min resistance with pain Tolerated mod resistance in sitting  Middle trapezius    Lower trapezius    Elbow flexion    Elbow extension    Wrist flexion    Wrist extension    Wrist ulnar deviation    Wrist radial deviation    Wrist pronation    Wrist supination    Grip strength (lbs)    (Blank rows = not tested)  SHOULDER SPECIAL TESTS: ER lag:  negative  with arm at side                                                                                                                             TREATMENT:   09/24/23 Pulleys in flexion 2x15 and scaption x 15  Standing forward punch 3x10 Supine flexion AAROM with manual assist 2x10 Supine punches 2x10  Pt received R shoulder PROM in flexion, scaption, and ER per pt and tissue tolerance.   Supine ER AROM 2x10 ER/IR rhythmic stab's at 30 deg abd in supine 2x30 sec each Seated cane flexion x10 Standing ball rolls at table (green physioball) 2x10 in flexion and scaption  09/16/23 Pulleys in flexion 2x15 and scaption x 15  Standing forward punch 3x10 Supine flexion AAROM with manual assist 2x10 Supine punches 2x10  Pt received R shoulder PROM in flexion, scaption, and ER per pt and tissue tolerance.   Supine ER AROM 2x10 ER/IR rhythmic stab's at 30 and 50 deg abd in supine 2x30 sec each Seated cane flexion  x10, 5 reps Standing ball rolls at table (green physioball) x10 in flexion and scaption   09/10/23 Standing ball rolls at table (green physioball) 2x10 in flexion and scaption Pulleys in flexion Standing forward punches 3x10 Standing row GTB with retraction 2 x 15 Standing shoulder extension with GTB 2x15 Well arm assist supine flexion x10 Supine flexion with manual assistance from PTA x10 Seated chicken wing x10 (had pain by end of 10) Cone reaches multidirection Seated cane press out x10 Seated cane flexion  Pt received R shoulder PROM in flexion, scaption, and ER in supine per pt and tissue tolerance. GHJ mobilizations Grade II posterior and inferior glides Instruction in theracane    09/05/23 Standing ball rolls at table (green physioball) 2x10 in flexion and scaption Pulleys in flexion Standing forward punches 3x10 Standing row GTB with retraction 2 x 15 Standing shoulder extension with GTB 2x15 Supine flexion with manual assistance from PT 2x10   Pt received R  shoulder PROM in flexion, scaption, and ER in supine per pt and tissue tolerance.   09/02/23 Standing ball rolls at table (green physioball) 2x10 in flexion and scaption Pulleys in flexion Standing forward punches 3x10 Standing row GTB with retraction 2 x 15 Standing shoulder extension with GTB 2x15 Supine serratus punch 3x10 Supine rhythmic stab's at 90 deg flexion gently 3x30 sec Supine ER AROM 2x10 Supine flexion with manual assistance from PT 2x10 Supine ER/IR rhythmic stabs at 30 deg 3x30 sec  Pt received R shoulder PROM in flexion, scaption, and ER in supine per pt and tissue tolerance.   08/30/23 Pt received R shoulder PROM in flexion, scaption, and ER in supine per pt and tissue tolerance.  Supine serratus punch 3x10 Supine rhythmic stab's (gently) at 90 deg flexion 3x30 sec Supine ER/IR rhythmic stabs at 30 deg 3x30 sec  Supine ER AROM 2x10 Standing forward punches 2x10 Standing row GTB with retraction 2 x 15 Standing shoulder extension with GTB 2x15 Standing ball rolls at table (green physioball) 2x10 in flexion and scaption   08/26/23 Pulleys in flexion approx 15 and 10 reps, attempted scaption Standing ball rolls at table (green physioball) 2x10 in flexion and scaption Standing shoulder flexion in front of mirror (partial range) x10 Standing forward punches 2x10 Supine serratus punches 3x10 Supine ER AROM 2x10 Submaximal shoulder isometrics in standing with 5 sec hold in flexion, abd, and ER  Pt received R shoulder PROM in flexion, scaption, and ER per pt and tissue tolerance     PATIENT EDUCATION: Education details: dx, HEP, POC, exercise form, and rationale of interventions.   Person educated: Patient Education method: Explanation, Demonstration, Tactile cues, Verbal cues, and Handouts Education comprehension: verbalized understanding, returned demonstration, verbal cues required, tactile cues required, and needs further education  HOME EXERCISE  PROGRAM: Access Code: [email or web address] URL: [email or web address] Date: 07/11/2023 Prepared by: Mose Minerva  Exercises - Seated Scapular Retraction  - 2 x daily - 7 x weekly - 2 sets - 10 reps - 3 second hold - Seated Shoulder Flexion Towel Slide at Table Top  - 2 x daily - 7 x weekly - 2 sets - 10 reps  ASSESSMENT:  CLINICAL IMPRESSION:  Pain with passive ER so did not push this. Limited in ROM with pulleys. Good tolerance for scapular strengthening and AAROM within available motion. She is awaiting to hear about cardiac surgery before planning shoulder surgery. Will continue to work on progressing functional ROM and strength as tolerated in preparation for shoulder surgery.  OBJECTIVE IMPAIRMENTS: decreased activity tolerance, decreased ROM, decreased strength, hypomobility, impaired flexibility, impaired UE functional use, and pain.   ACTIVITY LIMITATIONS: bathing, dressing, reach over head, and hygiene/grooming  PARTICIPATION LIMITATIONS: meal prep, cleaning, and laundry  PERSONAL FACTORS: 1-2 comorbidities: RA and chronic back pain are also affecting patient's functional outcome.   REHAB POTENTIAL: Good  CLINICAL DECISION MAKING: Stable/uncomplicated  EVALUATION COMPLEXITY: Low   GOALS:  SHORT TERM GOALS: Target date:  07/31/23   Pt will be independent and compliant with HEP for improved pain, ROM, strength, and function.  Baseline: Goal status: GOAL MET  2.  Pt will demo at least a 30 deg increase in flexion, scaption, and abd AROM for improved shoulder mobility and stiffness.  Baseline:  Goal status: PROGRESSING  7/16  3.  Pt will report at least a 25% improvement in performance of ADL's/self care activities.  Baseline:  Goal status: GOAL MET  7/16  4.  Pt will be able to wash and dry her hair without significant difficulty.  Baseline:  Goal status: NOT MET Target date:  08/07/2023    LONG TERM GOALS: Target date:  09/26/23  Pt will be able  to reach overhead into a cabinet without significant pain and difficulty.  Baseline:  Goal status: NOT MET  7/16  2.  Pt will be able to perform her ADLs and IADLs without significant difficulty and pain. Baseline:  Goal status: PROGRESSING  3.  Pt will demo improved R shoulder AROM to at least 130 deg in flexion and 50 deg in ER for improved reaching and performance of ADLs.  Baseline:  Goal status: NOT MET  4.  Pt will tolerate strengthening and stability exercises without adverse effects to improve R glenohumeral and scapular strength and stability to perform reaching activities, household chores, and functional lifting activities.   Baseline:  Goal status: ONGOING    PLAN:  PT FREQUENCY: 2x/week  PT DURATION:  4-5 weeks  PLANNED INTERVENTIONS: [id number]- PT Re-evaluation, 97750- Physical Performance Testing, 97110-Therapeutic exercises, 97530- Therapeutic activity, [id number]- Neuromuscular re-education, 97535- Self Care, [id number]- Manual therapy, [id number]- Electrical stimulation (unattended), [id number]- Electrical stimulation (manual), [id number]- Ultrasound, Patient/Family education, Taping, Cryotherapy, and Moist heat  PLAN FOR NEXT SESSION: Cont with shoulder ROM, strengthening, and stability per pt tolerance.  Review and perform HEP.      Asberry Rodes, PTA  09/24/23 5:25 PM

## 2023-09-27 DIAGNOSIS — M5416 Radiculopathy, lumbar region: Secondary | ICD-10-CM | POA: Diagnosis not present

## 2023-09-30 DIAGNOSIS — R0609 Other forms of dyspnea: Secondary | ICD-10-CM | POA: Diagnosis not present

## 2023-09-30 DIAGNOSIS — I10 Essential (primary) hypertension: Secondary | ICD-10-CM | POA: Diagnosis not present

## 2023-09-30 DIAGNOSIS — I350 Nonrheumatic aortic (valve) stenosis: Secondary | ICD-10-CM | POA: Diagnosis not present

## 2023-10-01 ENCOUNTER — Ambulatory Visit (HOSPITAL_BASED_OUTPATIENT_CLINIC_OR_DEPARTMENT_OTHER): Payer: Self-pay | Admitting: Physical Therapy

## 2023-10-01 DIAGNOSIS — M25611 Stiffness of right shoulder, not elsewhere classified: Secondary | ICD-10-CM | POA: Diagnosis not present

## 2023-10-01 DIAGNOSIS — M6281 Muscle weakness (generalized): Secondary | ICD-10-CM | POA: Diagnosis not present

## 2023-10-01 DIAGNOSIS — M25511 Pain in right shoulder: Secondary | ICD-10-CM | POA: Diagnosis not present

## 2023-10-01 NOTE — Therapy (Signed)
 OUTPATIENT PHYSICAL THERAPY SHOULDER TREATMENT   Patient Name: Wanda Lang MRN: [id number] DOB:Dec 07, 1950, 73 y.o., female Today's Date: 10/02/2023  END OF SESSION:   PT End of Session -  10/01/2023    Visit Number 19   Number of Visits 20   Date for PT Re-Evaluation 10/22/2023   Authorization Type [employer] MCR   PT Start Time  1032   PT Stop Time 1114   PT Time Calculation (min) 42 min   Activity Tolerance Patient tolerated treatment well.   Behavior During Therapy  [facility] for tasks assessed/performed.                Past Medical History:  Diagnosis Date   Allergy    Arthritis    Coronary artery disease    Fatty liver    GERD (gastroesophageal reflux disease)    Heart murmur    Hypertension    Hx   LIVER FUNCTION TESTS, ABNORMAL, HX OF 09/08/2007   Qualifier: Diagnosis of  By: Nelson-Smith CMA (AAMA), Dottie     Neuromuscular disorder [facility]    Essential Tremor   Overactive bladder    Past Surgical History:  Procedure Laterality Date   ABDOMINAL HYSTERECTOMY     COLONOSCOPY WITH PROPOFOL  N/A 09/04/2018   Procedure: COLONOSCOPY WITH PROPOFOL ;  Surgeon: Janalyn Keene NOVAK, MD;  Location: [facility] ENDOSCOPY;  Service: Endoscopy;  Laterality: N/A;   ESOPHAGOGASTRODUODENOSCOPY (EGD) WITH PROPOFOL  N/A 09/04/2018   Procedure: ESOPHAGOGASTRODUODENOSCOPY (EGD) WITH PROPOFOL ;  Surgeon: Janalyn Keene NOVAK, MD;  Location: [facility] ENDOSCOPY;  Service: Endoscopy;  Laterality: N/A;   LAPAROSCOPY     for endometriosis x2   MOUTH SURGERY  04/2023   tooth extraction, gum graft   RIGHT/LEFT HEART CATH AND CORONARY ANGIOGRAPHY Bilateral 09/19/2023   Procedure: RIGHT/LEFT HEART CATH AND CORONARY ANGIOGRAPHY;  Surgeon: Ammon Blunt, MD;  Location: [facility] INVASIVE CV LAB;  Service: Cardiovascular;  Laterality: Bilateral;   TONSILLECTOMY     TOTAL HIP ARTHROPLASTY Right 05/15/2022   Procedure: RIGHT TOTAL HIP ARTHROPLASTY ANTERIOR APPROACH;  Surgeon: Vernetta Lonni GRADE,  MD;  Location: [facility] OR;  Service: Orthopedics;  Laterality: Right;   TOTAL KNEE ARTHROPLASTY Left 07/18/2021   Procedure: TOTAL KNEE ARTHROPLASTY;  Surgeon: Ernie Cough, MD;  Location: [facility] ORS;  Service: Orthopedics;  Laterality: Left;   TURBINATE REDUCTION     Patient Active Problem List   Diagnosis Date Noted   Protracted URI 05/03/2023   Acute non-recurrent pansinusitis 02/04/2023   OSA (obstructive sleep apnea) 11/14/2022   Arthritis 11/14/2022   Nocturia 10/05/2022   Hypersomnia 10/05/2022   S/P total hip arthroplasty 05/17/2022   Status post total replacement of right hip 05/15/2022   Overactive bladder 04/04/2022   Postmenopausal estrogen deficiency 11/13/2021   Primary osteoarthritis 11/13/2021   Leg length inequality 11/06/2021   Impaired ambulation 10/23/2021   Contact dermatitis 10/16/2021   SI joint arthritis (HCC) 08/24/2021   Spinal stenosis, lumbar region, without neurogenic claudication 08/24/2021   Degeneration of lumbar intervertebral disc 07/24/2021   S/P total knee arthroplasty, left 07/18/2021   Preop examination 07/07/2021   Right hip pain 07/07/2021   Low back pain 07/07/2021   Drug-induced immunodeficiency (HCC) 06/12/2021   Impingement syndrome of right shoulder region 06/12/2021   COVID-19 01/24/2021   Heart murmur 11/24/2020   Hammer toe 04/22/2020   Metatarsalgia of left foot 04/22/2020   Osteoarthritis of midfoot 04/22/2020   Duodenal ulcer 11/12/2019   Stress 11/12/2019   Morbid obesity (HCC) 11/06/2019   Pain in  left knee 07/30/2019   Aortic stenosis, moderate 02/06/2018   Chronic diarrhea 10/09/2017   Acid reflux 07/15/2017   Asymptomatic carotid artery stenosis 07/15/2017   Cyst of skin 07/15/2017   Right maxillary sinusitis 11/09/2016   Immunocompromised (HCC) 11/09/2016   Allergic rhinitis 11/02/2016   Benign essential tremor 11/02/2016   History of hypertension 11/02/2016   Postmenopausal symptoms 07/01/2015   Fatty liver 09/08/2007    Rheumatoid arthritis (HCC) 09/08/2007     REFERRING PROVIDER: Carilyn Prentice BRAVO, MD  REFERRING DIAG: [phone] (ICD-10-CM) - Chronic right shoulder pain  THERAPY DIAG:  Stiffness of right shoulder, not elsewhere classified  Right shoulder pain, unspecified chronicity  Muscle weakness (generalized)  Rationale for Evaluation and Treatment: Rehabilitation  ONSET DATE: Late 2024/January 2025  SUBJECTIVE:                                                                                                                                                                                      SUBJECTIVE STATEMENT:  Pt reports she has moderate-severe though more severe than moderate aortic stenosis.  Pt states her coronary arteries are clear.  Pt is being referred to a surgeon and seeing the surgeon 9/8.    Everything is easier.  I use my arm a lot more.  Pt states she has less pain and more ROM.  Pt is still limited. She is limited with reaching behind back and unable to reach overhead.      Hand dominance: Right  PERTINENT HISTORY: Chronic LBP, multilevel DDD, SI pain R THA 04/24 L TKA 06/23 RA Right Knee End-stage OA  Bladder incontinence    PAIN:  Location: 0/10 current, 7/10 worst, 0/10 best   PRECAUTIONS: RA, R THA, L TKA  RED FLAGS: Bowel or bladder incontinence: Yes:     WEIGHT BEARING RESTRICTIONS: No  FALLS:  Has patient fallen in last 6 months? No  LIVING ENVIRONMENT: Lives with: lives alone Lives in: 2 story home though mainly lives on the 1st floor Stairs: yes Has following equipment at home: chair lift on stairs, walking stick, FWW  OCCUPATION: retired  PLOF: Independent  PATIENT GOALS:to get rid of pain in shoulder and use her R arm.  NEXT MD VISIT:   OBJECTIVE:  Note: Objective measures were completed at Evaluation unless otherwise noted.  DIAGNOSTIC FINDINGS:  R shoulder x ray:   FINDINGS: There is diffuse decreased bone  mineralization. Severe glenohumeral joint space narrowing with bone-on-bone contact, subchondral sclerosis, subchondral cystic change, and moderate superomedial humeral head and glenoid fossa cortical flattening/remodeling.   Mild acromioclavicular joint space narrowing and peripheral osteophytosis.   No acute fracture or dislocation.  The visualized portion of the right lung is unremarkable.   IMPRESSION: 1. Severe glenohumeral osteoarthritis. 2. Mild acromioclavicular osteoarthritis.   PATIENT SURVEYS:  UEFI: Initial/Current: 20/80  /  31/80  COGNITION: Overall cognitive status: Within functional limits for tasks assessed      UPPER EXTREMITY ROM:   AROM/PROM Right eval Left eval Right 7/16 Right 8/26  Shoulder flexion 62 with pain 152 78 with tightness and little pain  AAROM: (supine) 112  PROM:  116 78 with tightness and a little pain  AAROM:  (supine)  106  PROM: 119  Shoulder scaption 52 with pain 155 72 with pain 69 with pain  Shoulder abduction 38 with pain 138 64 with some pain 68 with little pain  Shoulder adduction      Shoulder internal rotation 47/44     Shoulder external rotation 24/22   16/18 26/22  Elbow flexion      Elbow extension      Wrist flexion      Wrist extension      Wrist ulnar deviation      Wrist radial deviation      Wrist pronation      Wrist supination      (Blank rows = not tested)  UPPER EXTREMITY MMT:  MMT Right eval Left eval  Shoulder flexion  5/5  Shoulder extension    Shoulder abduction  4+/5  Shoulder adduction    Shoulder internal rotation Tolerated min to mod resistance with pain WFL in sitting  Shoulder external rotation Tolerated min resistance with pain Tolerated mod resistance in sitting  Middle trapezius    Lower trapezius    Elbow flexion    Elbow extension    Wrist flexion    Wrist extension    Wrist ulnar deviation    Wrist radial deviation    Wrist pronation    Wrist supination    Grip  strength (lbs)    (Blank rows = not tested)  SHOULDER SPECIAL TESTS: ER lag:  negative with arm at side                                                                                                                             TREATMENT:   10/01/23 Assessed ROM UEFI:  31/80  Pulleys in flexion and scaption Standing forward punch 2x10 Shelf reach to the 1st shelf 2x15 Supine ER AROM 2x10 Supine punches 3x10 ER/IR rhythmic stab's at 30 deg abd in supine 2x30 sec each Supine flexion AROM with PT assistance x 10 reps  Pt received R shoulder PROM in flexion and ER per pt and tissue tolerance.   09/24/23 Pulleys in flexion 2x15 and scaption x 15  Standing forward punch 3x10 Supine flexion AAROM with manual assist 2x10 Supine punches 2x10  Pt received R shoulder PROM in flexion, scaption, and ER per pt and tissue tolerance.   Supine ER AROM 2x10 ER/IR rhythmic stab's at 30 deg abd in supine  2x30 sec each Seated cane flexion x10 Standing ball rolls at table (green physioball) 2x10 in flexion and scaption  09/16/23 Pulleys in flexion 2x15 and scaption x 15  Standing forward punch 3x10 Supine flexion AAROM with manual assist 2x10 Supine punches 2x10  Pt received R shoulder PROM in flexion, scaption, and ER per pt and tissue tolerance.   Supine ER AROM 2x10 ER/IR rhythmic stab's at 30 and 50 deg abd in supine 2x30 sec each Seated cane flexion x10, 5 reps Standing ball rolls at table (green physioball) x10 in flexion and scaption   09/10/23 Standing ball rolls at table (green physioball) 2x10 in flexion and scaption Pulleys in flexion Standing forward punches 3x10 Standing row GTB with retraction 2 x 15 Standing shoulder extension with GTB 2x15 Well arm assist supine flexion x10 Supine flexion with manual assistance from PTA x10 Seated chicken wing x10 (had pain by end of 10) Cone reaches multidirection Seated cane press out x10 Seated cane flexion  Pt received R  shoulder PROM in flexion, scaption, and ER in supine per pt and tissue tolerance. GHJ mobilizations Grade II posterior and inferior glides Instruction in theracane    09/05/23 Standing ball rolls at table (green physioball) 2x10 in flexion and scaption Pulleys in flexion Standing forward punches 3x10 Standing row GTB with retraction 2 x 15 Standing shoulder extension with GTB 2x15 Supine flexion with manual assistance from PT 2x10   Pt received R shoulder PROM in flexion, scaption, and ER in supine per pt and tissue tolerance.     PATIENT EDUCATION: Education details: dx, HEP, POC, exercise form, and rationale of interventions.   Person educated: Patient Education method: Explanation, Demonstration, Tactile cues, Verbal cues, and Handouts Education comprehension: verbalized understanding, returned demonstration, verbal cues required, tactile cues required, and needs further education  HOME EXERCISE PROGRAM: Access Code: [email or web address] URL: [email or web address] Date: 07/11/2023 Prepared by: Mose Minerva  Exercises - Seated Scapular Retraction  - 2 x daily - 7 x weekly - 2 sets - 10 reps - 3 second hold - Seated Shoulder Flexion Towel Slide at Table Top  - 2 x daily - 7 x weekly - 2 sets - 10 reps  ASSESSMENT:  CLINICAL IMPRESSION:  Pain with passive ER so did not push this. Limited in ROM with pulleys. Good tolerance for scapular strengthening and AAROM within available motion. She is awaiting to hear about cardiac surgery before planning shoulder surgery. Will continue to work on progressing functional ROM and strength as tolerated in preparation for shoulder surgery.   Everything is easier.  I use my arm a lot more.  Pt states she has less pain and more ROM.  Pt is still limited. She is limited with reaching behind back and unable to reach overhead.  1 more visit    OBJECTIVE IMPAIRMENTS: decreased activity tolerance, decreased ROM, decreased strength,  hypomobility, impaired flexibility, impaired UE functional use, and pain.   ACTIVITY LIMITATIONS: bathing, dressing, reach over head, and hygiene/grooming  PARTICIPATION LIMITATIONS: meal prep, cleaning, and laundry  PERSONAL FACTORS: 1-2 comorbidities: RA and chronic back pain are also affecting patient's functional outcome.   REHAB POTENTIAL: Good  CLINICAL DECISION MAKING: Stable/uncomplicated  EVALUATION COMPLEXITY: Low   GOALS:  SHORT TERM GOALS: Target date:  07/31/23   Pt will be independent and compliant with HEP for improved pain, ROM, strength, and function.  Baseline: Goal status: GOAL MET  2.  Pt will demo at least a 30 deg increase in flexion, scaption, and abd  AROM for improved shoulder mobility and stiffness.  Baseline:  Goal status: PROGRESSING  7/16  3.  Pt will report at least a 25% improvement in performance of ADL's/self care activities.  Baseline:  Goal status: GOAL MET  7/16  4.  Pt will be able to wash and dry her hair without significant difficulty.  Baseline:  Goal status: NOT MET Target date:  08/07/2023    LONG TERM GOALS: Target date:  09/26/23  Pt will be able to reach overhead into a cabinet without significant pain and difficulty.  Baseline:  Goal status: NOT MET  7/16  2.  Pt will be able to perform her ADLs and IADLs without significant difficulty and pain. Baseline:  Goal status: PROGRESSING  3.  Pt will demo improved R shoulder AROM to at least 130 deg in flexion and 50 deg in ER for improved reaching and performance of ADLs.  Baseline:  Goal status: NOT MET  4.  Pt will tolerate strengthening and stability exercises without adverse effects to improve R glenohumeral and scapular strength and stability to perform reaching activities, household chores, and functional lifting activities.   Baseline:  Goal status: ONGOING    PLAN:  PT FREQUENCY: 2x/week  PT DURATION:  4-5 weeks  PLANNED INTERVENTIONS: 97164- PT Re-evaluation,  97750- Physical Performance Testing, 97110-Therapeutic exercises, 97530- Therapeutic activity, [id number]- Neuromuscular re-education, 97535- Self Care, 02859- Manual therapy, G0283- Electrical stimulation (unattended), [id number]- Electrical stimulation (manual), [id number]- Ultrasound, Patient/Family education, Taping, Cryotherapy, and Moist heat  PLAN FOR NEXT SESSION: Cont with shoulder ROM, strengthening, and stability per pt tolerance.  Review and perform HEP.      Asberry Rodes, PTA  10/02/23 8:25 PM

## 2023-10-02 ENCOUNTER — Encounter (HOSPITAL_BASED_OUTPATIENT_CLINIC_OR_DEPARTMENT_OTHER): Payer: Self-pay | Admitting: Physical Therapy

## 2023-10-02 DIAGNOSIS — M9901 Segmental and somatic dysfunction of cervical region: Secondary | ICD-10-CM | POA: Diagnosis not present

## 2023-10-02 DIAGNOSIS — M9902 Segmental and somatic dysfunction of thoracic region: Secondary | ICD-10-CM | POA: Diagnosis not present

## 2023-10-02 DIAGNOSIS — M9903 Segmental and somatic dysfunction of lumbar region: Secondary | ICD-10-CM | POA: Diagnosis not present

## 2023-10-02 DIAGNOSIS — M9904 Segmental and somatic dysfunction of sacral region: Secondary | ICD-10-CM | POA: Diagnosis not present

## 2023-10-03 ENCOUNTER — Telehealth: Payer: Self-pay

## 2023-10-03 DIAGNOSIS — G4733 Obstructive sleep apnea (adult) (pediatric): Secondary | ICD-10-CM | POA: Diagnosis not present

## 2023-10-03 NOTE — Telephone Encounter (Signed)
 Copied from CRM [id number]. Topic: General - Other >> Oct 03, 2023  4:21 PM Chiquita SQUIBB wrote: Reason for CRM: Patient is calling in stating she would like it put into her medical records that when she puts a band aid on it rips her skin off. Patient would like this noted in her record before her upcoming surgery, incase of any bandages.

## 2023-10-04 NOTE — Telephone Encounter (Signed)
Noted in allergies.

## 2023-10-08 DIAGNOSIS — M9903 Segmental and somatic dysfunction of lumbar region: Secondary | ICD-10-CM | POA: Diagnosis not present

## 2023-10-08 DIAGNOSIS — M9904 Segmental and somatic dysfunction of sacral region: Secondary | ICD-10-CM | POA: Diagnosis not present

## 2023-10-08 DIAGNOSIS — M9901 Segmental and somatic dysfunction of cervical region: Secondary | ICD-10-CM | POA: Diagnosis not present

## 2023-10-08 DIAGNOSIS — M9902 Segmental and somatic dysfunction of thoracic region: Secondary | ICD-10-CM | POA: Diagnosis not present

## 2023-10-09 ENCOUNTER — Other Ambulatory Visit
Admission: RE | Admit: 2023-10-09 | Discharge: 2023-10-09 | Disposition: A | Discharge status: Home / Self Care | Source: Intra-hospital | Attending: Surgery | Admitting: Surgery

## 2023-10-09 ENCOUNTER — Encounter: Payer: Self-pay | Admitting: Surgery

## 2023-10-09 ENCOUNTER — Telehealth: Payer: Self-pay

## 2023-10-09 ENCOUNTER — Ambulatory Visit: Admitting: Surgery

## 2023-10-09 VITALS — BP 127/80 | HR 93 | Temp 98.3°F | Ht 64.5 in | Wt 229.0 lb

## 2023-10-09 DIAGNOSIS — R7989 Other specified abnormal findings of blood chemistry: Secondary | ICD-10-CM

## 2023-10-09 DIAGNOSIS — K8020 Calculus of gallbladder without cholecystitis without obstruction: Secondary | ICD-10-CM | POA: Diagnosis not present

## 2023-10-09 LAB — HEPATIC FUNCTION PANEL
ALT: 214 U/L — ABNORMAL HIGH (ref 0–44)
AST: 302 U/L — ABNORMAL HIGH (ref 15–41)
Albumin: 3.6 g/dL (ref 3.5–5.0)
Alkaline Phosphatase: 532 U/L — ABNORMAL HIGH (ref 38–126)
Bilirubin, Direct: 0.6 mg/dL — ABNORMAL HIGH (ref 0.0–0.2)
Indirect Bilirubin: 1.1 mg/dL — ABNORMAL HIGH (ref 0.3–0.9)
Total Bilirubin: 1.7 mg/dL — ABNORMAL HIGH (ref 0.0–1.2)
Total Protein: 7.1 g/dL (ref 6.5–8.1)

## 2023-10-09 NOTE — Patient Instructions (Signed)
 Gallbladder Problems: Eating Plan Having high blood cholesterol, obesity, an inactive lifestyle, an unhealthy diet, or diabetes can put you at risk for getting gallstones. If you have a gallbladder problem, you may have trouble digesting fats. You may also have symptoms when you eat a lot of fat. Eating a low-fat diet can help lessen your symptoms. It may be helpful before and after having surgery to have your gallbladder taken out. Your health care provider may recommend that you work with an expert in healthy eating called a dietitian. They can help you lower the amount of fat in your diet. What are tips for following this plan? General guidelines Limit how much fat you have to less than 30% of your total daily calories. If you eat around 1,800 calories each day, you'll be eating less than 60 grams (g) of fat a day. Fat is an important part of a healthy diet. Eating a low-fat diet can make it hard to keep a healthy body weight. Ask your dietitian how much fat, calories, and other nutrients you need each day. Eat small meals often throughout the day instead of 3 large meals. Drink at least 8-10 cups (1.9-2.4 L) of fluid a day. If you drink alcohol: Limit how much you have to: 0-1 drink a day if you're female. 0-2 drinks a day if you're female. Know how much alcohol is in your drink. In the [place] one drink is one 12 oz bottle of beer (355 mL), one 5 oz glass of wine (148 mL), or one 1 oz glass of hard liquor (44 mL). Reading food labels  Check nutrition facts on food labels for how much fat is in a serving. Choose foods with less than 3 grams of fat per serving. Shopping Choose nonfat and low-fat healthy foods. Look for the words nonfat, low-fat, or fat-free. Avoid buying processed or prepackaged foods. Cooking Cook using low-fat methods, such as baking, broiling, grilling, or boiling. Cook with small amounts of healthy fats, such as olive oil, grapeseed oil, canola oil, avocado oil, or  sunflower oil. What foods are recommended?  All fresh, frozen, or canned fruits and vegetables. Whole grains. Low-fat or nonfat (skim) milk and yogurt. Lean meat, skinless poultry, fish, eggs, and beans. Low-fat protein supplement powders or drinks. Spices and herbs. The items listed above may not be all the foods and drinks you can have. Talk with a dietitian to learn more. What foods are not recommended? High-fat foods. These include baked goods, fast food, fatty cuts of meat, ice cream, french toast, sweet rolls, pizza, cheese bread, foods covered with butter, creamy sauces, or cheese. Fried foods. These include french fries, tempura, battered fish, breaded chicken, fried breads, and sweets. Foods that cause bloating and gas. The items listed above may not be all the foods and drinks you should avoid. Talk with a dietitian to learn more. This information is not intended to replace advice given to you by your health care provider. Make sure you discuss any questions you have with your health care provider. Document Revised: 04/30/2023 Document Reviewed: 04/30/2023 Elsevier Patient Education  2025 [employer].

## 2023-10-09 NOTE — Telephone Encounter (Signed)
 error

## 2023-10-10 ENCOUNTER — Ambulatory Visit: Admitting: Nurse Practitioner

## 2023-10-10 ENCOUNTER — Encounter: Payer: Self-pay | Admitting: Surgery

## 2023-10-10 VITALS — BP 108/78 | HR 90 | Temp 98.7°F | Ht 64.5 in | Wt 230.2 lb

## 2023-10-10 DIAGNOSIS — R7989 Other specified abnormal findings of blood chemistry: Secondary | ICD-10-CM | POA: Diagnosis not present

## 2023-10-10 DIAGNOSIS — I350 Nonrheumatic aortic (valve) stenosis: Secondary | ICD-10-CM | POA: Diagnosis not present

## 2023-10-10 DIAGNOSIS — M069 Rheumatoid arthritis, unspecified: Secondary | ICD-10-CM | POA: Diagnosis not present

## 2023-10-10 NOTE — Progress Notes (Unsigned)
 Leron Glance, NP-C Phone: [phone]  Wanda Lang is a 73 y.o. female who presents today for follow up.   Discussed the use of AI scribe software for clinical note transcription with the patient, who gave verbal consent to proceed.  History of Present Illness   Wanda Lang is a 73 year old female with severe aortic stenosis who presents for surgical clearance.  She is seeking surgical clearance for a shoulder replacement and subsequent foot surgery. Her cardiologist has advised against any surgeries until her aortic stenosis is addressed, as she reports being told she has severe aortic stenosis. She is scheduled to meet with a cardiac surgeon at [facility] on Monday to discuss her condition.  She has elevated liver enzymes, with alkaline phosphatase over 500 and AST at 302. She has undergone various scans, including an MRCP, and has been referred to a gastroenterologist. Despite having gallstones, her gallbladder is not believed to be the cause of the elevated liver enzymes. No abdominal pain, nausea, or vomiting. Her husband passed away from liver failure three years ago, which heightens her concern about her liver health.  She is currently on Celebrex  and receives Orencia  infusions every four weeks for rheumatoid arthritis. She has not taken Orencia  since August due to potential upcoming surgeries. She also takes Tylenol , usually 1000 mg once or twice a day, and has previously stopped it when her liver enzymes first elevated in May. Other medications include vitamin B, calcium , vitamin C, vitamin D , eye drops, Allegra, lidocaine  patches, magnesium , and multivitamins.      Social History   Tobacco Use  Smoking Status Never   Passive exposure: Never  Smokeless Tobacco Never    Current Outpatient Medications on File Prior to Visit  Medication Sig Dispense Refill   Abatacept  (ORENCIA  IV) Inject 1 application  into the vein every 30 (thirty) days.     acetaminophen  (TYLENOL ) 500  MG tablet Take 1,000 mg by mouth 2 (two) times daily.     Ascorbic Acid (VITAMIN C PO) Take 500 mg by mouth daily.     b complex vitamins capsule Take 1 capsule by mouth daily. With vit C     CALCIUM  PO Take 1,200 mg by mouth daily.     celecoxib  (CELEBREX ) 200 MG capsule Take 200 mg by mouth daily.     cholecalciferol (VITAMIN D3) 25 MCG (1000 UNIT) tablet Take 4,000 Units by mouth daily.     cycloSPORINE  (RESTASIS ) 0.05 % ophthalmic emulsion Place 1 drop into both eyes 2 (two) times daily.     Estradiol (VAGIFEM) 10 MCG TABS vaginal tablet Take 10 mcg by mouth 2 (two) times a week.     fexofenadine (ALLEGRA) 180 MG tablet Take 180 mg by mouth daily.     Glucosamine HCl 1500 MG TABS Take 1,500 mg by mouth daily.     Iodine  Strong, Lugols, (IODINE  STRONG PO) Take 1 Dose by mouth daily. 1950 mcg. 3 drops per day.     lidocaine  (LIDODERM ) 5 % Place 2-3 patches onto the skin daily.     MAGNESIUM  PO Take 1,250 mg by mouth 2 (two) times a week. Glycinate     methocarbamol  (ROBAXIN ) 750 MG tablet Take 750 mg by mouth at bedtime.     Multiple Vitamin (MULTIVITAMIN WITH MINERALS) TABS tablet Take 1 tablet by mouth daily.     Omega-3 Fatty Acids (FISH OIL PO) Take 750 mg by mouth daily.     OVER THE COUNTER MEDICATION Apply 1  Application topically daily at 12 noon. frankincense     oxyCODONE  (OXY IR/ROXICODONE ) 5 MG immediate release tablet Take 5 mg by mouth at bedtime.     trolamine salicylate (BLUE-EMU HEMP) 10 % cream Apply 1 Application topically as needed for muscle pain.     No current facility-administered medications on file prior to visit.     ROS see history of present illness  Objective  Physical Exam Vitals:   10/10/23 0858  BP: 108/78  Pulse: 90  Temp: 98.7 F (37.1 C)  SpO2: 97%    BP Readings from Last 3 Encounters:  10/10/23 108/78  10/09/23 127/80  09/19/23 (!) 142/79   Wt Readings from Last 3 Encounters:  10/10/23 230 lb 3.2 oz (104.4 kg)  10/09/23 229 lb (103.9  kg)  09/19/23 233 lb 9.6 oz (106 kg)    Physical Exam Constitutional:      General: She is not in acute distress.    Appearance: Normal appearance. She is obese.  HENT:     Head: Normocephalic.  Cardiovascular:     Rate and Rhythm: Normal rate and regular rhythm.     Heart sounds: Murmur heard.  Pulmonary:     Effort: Pulmonary effort is normal.     Breath sounds: Normal breath sounds.  Skin:    General: Skin is warm and dry.  Neurological:     General: No focal deficit present.     Mental Status: She is alert.  Psychiatric:        Mood and Affect: Mood normal.        Behavior: Behavior normal.      Assessment/Plan: Please see individual problem list.  Assessment and Plan    Nonrheumatic aortic stenosis   Severe aortic stenosis requires surgical intervention. The cardiologist advised against any surgeries until this is addressed. She will meet with a cardiac surgeon at [facility] on Monday to discuss options.  Abnormal liver enzymes   Liver enzymes are elevated, with alkaline phosphatase over 500 and AST at 302. Multiple scans, including MRCP, have been performed. A gastroenterologist has been consulted, and further evaluation is awaited. Potential medication-related causes, including Orencia  and Tylenol , are considered. Orencia  infusion is on hold due to potential surgery and immune suppression. Tylenol  intake was previously reduced without improvement. There is concern due to a family history of liver failure. Continue consultation with the gastroenterologist and consider reducing Tylenol  intake.  Rheumatoid arthritis   Managed with Orencia  infusions every four weeks, currently on hold due to potential surgery and immune suppression concerns. Continue holding Orencia  infusions until after heart surgery.  Bilateral knee pain after prior knee replacement   Bilateral knee pain persists despite prior knee replacement. The right knee is bone on bone, causing pain during movement.  Both knees hurt equally, but further intervention is deferred until cardiac and liver issues are resolved.       Severe aortic stenosis  Elevated LFTs     Return for as needed.   Leron Glance, NP-C [facility]

## 2023-10-10 NOTE — Progress Notes (Signed)
 10/10/2023  History of Present Illness: Wanda Lang is a 73 y.o. female presenting for follow-up of cholelithiasis and elevated LFTs.  Patient was last seen on 8//25.  At that point the patient was asymptomatic from a gallbladder standpoint.  Her LFTs had come back more elevated compared to her previous check with a total bilirubin of 0.9, AST 225, ALT 191, and alkaline phosphatase 399.  Due to this, we will order an MRCP which was done on 09/14/2023 which did not show any choledocholithiasis or inflammatory changes.  Today, the patient reports that she still remains asymptomatic.  She does have some issues with acid reflux and heartburn but she specifically feels the heartburn going up her chest.  However she denies any nausea, right upper quadrant pain, or specific postprandial pain.  In the interim, she has also been evaluated by cardiology and had a cardiac cath which showed severe aortic stenosis.  She has an appointment at [facility] next week for evaluation for possible TAVR.  The patient denies any specific chest pain or shortness of breath.  However due to some orthopedic issues with right shoulder issues, left foot issues, she has not been as active and has been more sedentary recently.  Past Medical History: Past Medical History:  Diagnosis Date   Allergy    Arthritis    Coronary artery disease    Fatty liver    GERD (gastroesophageal reflux disease)    Heart murmur    Hypertension    Hx   LIVER FUNCTION TESTS, ABNORMAL, HX OF 09/08/2007   Qualifier: Diagnosis of  By: Nelson-Smith CMA (AAMA), Dottie     Neuromuscular disorder (HCC)    Essential Tremor   Overactive bladder      Past Surgical History: Past Surgical History:  Procedure Laterality Date   ABDOMINAL HYSTERECTOMY     COLONOSCOPY WITH PROPOFOL  N/A 09/04/2018   Procedure: COLONOSCOPY WITH PROPOFOL ;  Surgeon: Janalyn Keene NOVAK, MD;  Location: [facility] ENDOSCOPY;  Service: Endoscopy;  Laterality: N/A;    ESOPHAGOGASTRODUODENOSCOPY (EGD) WITH PROPOFOL  N/A 09/04/2018   Procedure: ESOPHAGOGASTRODUODENOSCOPY (EGD) WITH PROPOFOL ;  Surgeon: Janalyn Keene NOVAK, MD;  Location: [facility] ENDOSCOPY;  Service: Endoscopy;  Laterality: N/A;   LAPAROSCOPY     for endometriosis x2   MOUTH SURGERY  04/2023   tooth extraction, gum graft   RIGHT/LEFT HEART CATH AND CORONARY ANGIOGRAPHY Bilateral 09/19/2023   Procedure: RIGHT/LEFT HEART CATH AND CORONARY ANGIOGRAPHY;  Surgeon: Ammon Blunt, MD;  Location: [facility] INVASIVE CV LAB;  Service: Cardiovascular;  Laterality: Bilateral;   TONSILLECTOMY     TOTAL HIP ARTHROPLASTY Right 05/15/2022   Procedure: RIGHT TOTAL HIP ARTHROPLASTY ANTERIOR APPROACH;  Surgeon: Vernetta Lonni GRADE, MD;  Location: [facility] OR;  Service: Orthopedics;  Laterality: Right;   TOTAL KNEE ARTHROPLASTY Left 07/18/2021   Procedure: TOTAL KNEE ARTHROPLASTY;  Surgeon: Ernie Cough, MD;  Location: [facility] ORS;  Service: Orthopedics;  Laterality: Left;   TURBINATE REDUCTION      Home Medications: Prior to Admission medications   Medication Sig Start Date End Date Taking? Authorizing Provider  Abatacept  (ORENCIA  IV) Inject 1 application  into the vein every 30 (thirty) days.   Yes [provider]  acetaminophen  (TYLENOL ) 500 MG tablet Take 1,000 mg by mouth 2 (two) times daily.   Yes [provider]  Ascorbic Acid (VITAMIN C PO) Take 500 mg by mouth daily.   Yes [provider]  b complex vitamins capsule Take 1 capsule by mouth daily. With vit C  Yes [provider]  CALCIUM  PO Take 1,200 mg by mouth daily.   Yes [provider]  celecoxib  (CELEBREX ) 200 MG capsule Take 200 mg by mouth daily.   Yes [provider]  cholecalciferol (VITAMIN D3) 25 MCG (1000 UNIT) tablet Take 4,000 Units by mouth daily.   Yes [provider]  cycloSPORINE  (RESTASIS ) 0.05 % ophthalmic emulsion Place 1 drop into both eyes 2 (two) times daily.   Yes  [provider]  Estradiol (VAGIFEM) 10 MCG TABS vaginal tablet Take 10 mcg by mouth 2 (two) times a week.   Yes [provider]  fexofenadine (ALLEGRA) 180 MG tablet Take 180 mg by mouth daily.   Yes [provider]  Glucosamine HCl 1500 MG TABS Take 1,500 mg by mouth daily.   Yes [provider]  Iodine  Strong, Lugols, (IODINE  STRONG PO) Take 1 Dose by mouth daily. 1950 mcg. 3 drops per day.   Yes [provider]  lidocaine  (LIDODERM ) 5 % Place 2-3 patches onto the skin daily.   Yes [provider]  MAGNESIUM  PO Take 1,250 mg by mouth 2 (two) times a week. Glycinate   Yes [provider]  methocarbamol  (ROBAXIN ) 750 MG tablet Take 750 mg by mouth at bedtime.   Yes [provider]  Multiple Vitamin (MULTIVITAMIN WITH MINERALS) TABS tablet Take 1 tablet by mouth daily.   Yes [provider]  Omega-3 Fatty Acids (FISH OIL PO) Take 750 mg by mouth daily.   Yes [provider]  OVER THE COUNTER MEDICATION Apply 1 Application topically daily at 12 noon. frankincense   Yes [provider]  oxyCODONE  (OXY IR/ROXICODONE ) 5 MG immediate release tablet Take 5 mg by mouth at bedtime. 05/15/23  Yes [provider]  trolamine salicylate (BLUE-EMU HEMP) 10 % cream Apply 1 Application topically as needed for muscle pain.   Yes [provider]    Allergies: Allergies  Allergen Reactions   Penicillins Itching    Tolerated Cephalosporin 07/18/21.   Other reaction(s): wheezing   Amoxicillin Itching and Other (See Comments)   Latex Dermatitis    Skin irritation   Misc. Sulfonamide Containing Compounds    Penicillin G     Other Reaction(s): Not available   Rosanil Cleanser [Sulfacetamide Sodium-Sulfur] Other (See Comments)    Unknown reaction    Sulfonamide Derivatives     Unknown reaction    Wound Dressing Adhesive Dermatitis    Pt states band aids rip her skin  Skin irritation    Ylang-Ylang [Cananga Oil (Ylang-Ylang)] Itching    Review of Systems: Review of Systems  Constitutional:  Negative for chills and fever.  Respiratory:  Negative for shortness of breath.   Cardiovascular:  Negative for chest pain.  Gastrointestinal:  Positive for heartburn. Negative for abdominal pain, nausea and vomiting.    Physical Exam BP 127/80   Pulse 93   Temp 98.3 F (36.8 C) (Oral)   Ht 5' 4.5 (1.638 m)   Wt 229 lb (103.9 kg)   SpO2 97%   BMI 38.70 kg/m  CONSTITUTIONAL: No acute distress HEENT:  Normocephalic, atraumatic, extraocular motion intact. RESPIRATORY:  Lungs are clear, and breath sounds are equal bilaterally. Normal respiratory effort without pathologic use of accessory muscles. CARDIOVASCULAR: Heart is regular with a systolic murmur consistent with her aortic stenosis. GI: The abdomen is soft, nondistended, nontender to palpation.  Negative Murphy's sign.  NEUROLOGIC:  Motor and sensation is grossly normal.  Cranial nerves are grossly  intact. PSYCH:  Alert and oriented to person, place and time. Affect is normal.  Labs/Imaging: MRCP on 09/14/2023: IMPRESSION: 1. No acute inflammatory process identified within the abdomen. 2. No intra or extrahepatic bile duct dilation. No choledocholithiasis. 3. Multiple subcentimeter sized simple cysts in the liver. No suspicious liver mass.  Assessment and Plan: This is a 73 y.o. female with cholelithiasis and elevated LFTs.  - Discussed with patient again the findings on her MRCP.  Overall she has remained asymptomatic without any episodes of biliary colic.  She has been maintaining at low-fat diet which she already does at home is at baseline.  At this point, given that she is asymptomatic and her current cardiac issues and the need for potential TAVR, there is currently no indication for proceeding with cholecystectomy. - Her LFTs were more elevated on her last visit with me.  These were repeated again today and they  continue to rise.  Her total bilirubin today is 1.7 with a direct component of 0.6, AST 302, ALT 214, and alkaline phosphatase 532.  Given that she is asymptomatic and had a negative MRCP last month, we will refer her to gastroenterology for further evaluation of her rising LFTs.  She does take Orencia  for her rheumatoid arthritis and technically this can cause elevated LFTs but I think would be important to rule out any other potential issues. - Maintain a low-fat diet.  Return precautions given.  Otherwise follow-up as needed.  I spent 20 minutes dedicated to the care of this patient on the date of this encounter to include pre-visit review of records, face-to-face time with the patient discussing diagnosis and management, and any post-visit coordination of care.   Aloysius Sheree Plant, MD [facility]

## 2023-10-14 DIAGNOSIS — I350 Nonrheumatic aortic (valve) stenosis: Secondary | ICD-10-CM | POA: Diagnosis not present

## 2023-10-14 DIAGNOSIS — Z01810 Encounter for preprocedural cardiovascular examination: Secondary | ICD-10-CM | POA: Diagnosis not present

## 2023-10-15 DIAGNOSIS — M9902 Segmental and somatic dysfunction of thoracic region: Secondary | ICD-10-CM | POA: Diagnosis not present

## 2023-10-15 DIAGNOSIS — M9904 Segmental and somatic dysfunction of sacral region: Secondary | ICD-10-CM | POA: Diagnosis not present

## 2023-10-15 DIAGNOSIS — M9903 Segmental and somatic dysfunction of lumbar region: Secondary | ICD-10-CM | POA: Diagnosis not present

## 2023-10-15 DIAGNOSIS — M9901 Segmental and somatic dysfunction of cervical region: Secondary | ICD-10-CM | POA: Diagnosis not present

## 2023-10-16 DIAGNOSIS — R7989 Other specified abnormal findings of blood chemistry: Secondary | ICD-10-CM | POA: Diagnosis not present

## 2023-10-18 ENCOUNTER — Encounter (HOSPITAL_BASED_OUTPATIENT_CLINIC_OR_DEPARTMENT_OTHER): Payer: Self-pay

## 2023-10-18 ENCOUNTER — Ambulatory Visit (HOSPITAL_BASED_OUTPATIENT_CLINIC_OR_DEPARTMENT_OTHER): Attending: Physical Medicine & Rehabilitation

## 2023-10-18 DIAGNOSIS — M6281 Muscle weakness (generalized): Secondary | ICD-10-CM | POA: Insufficient documentation

## 2023-10-18 DIAGNOSIS — M25611 Stiffness of right shoulder, not elsewhere classified: Secondary | ICD-10-CM | POA: Insufficient documentation

## 2023-10-18 DIAGNOSIS — M25511 Pain in right shoulder: Secondary | ICD-10-CM | POA: Diagnosis not present

## 2023-10-18 NOTE — Therapy (Addendum)
 OUTPATIENT PHYSICAL THERAPY SHOULDER TREATMENT   Patient Name: Wanda Lang MRN: [id number] DOB:22-Jul-1950, 73 y.o., female Today's Date: 10/18/2023  END OF SESSION:  PT End of Session - 10/18/23 0936     Visit Number 20    Number of Visits 20    Date for PT Re-Evaluation 10/22/23    Authorization Type [employer] MCR    Authorization Time Period 09.01.2025 - 10.04.2025    Authorization - Visit Number 1    Authorization - Number of Visits 5    Progress Note Due on Visit 29    PT Start Time 0933    PT Stop Time 1015    PT Time Calculation (min) 42 min    Activity Tolerance Patient tolerated treatment well    Behavior During Therapy [facility] for tasks assessed/performed          PT End of Session -  10/01/2023    Visit Number 19   Number of Visits 20   Date for PT Re-Evaluation 10/22/2023   Authorization Type [employer] MCR   PT Start Time  1032   PT Stop Time 1114   PT Time Calculation (min) 42 min   Activity Tolerance Patient tolerated treatment well.   Behavior During Therapy  [facility] for tasks assessed/performed.                Past Medical History:  Diagnosis Date   Allergy    Arthritis    Coronary artery disease    Fatty liver    GERD (gastroesophageal reflux disease)    Heart murmur    Hypertension    Hx   LIVER FUNCTION TESTS, ABNORMAL, HX OF 09/08/2007   Qualifier: Diagnosis of  By: Nelson-Smith CMA (AAMA), Dottie     Neuromuscular disorder [facility])    Essential Tremor   Overactive bladder    Past Surgical History:  Procedure Laterality Date   ABDOMINAL HYSTERECTOMY     COLONOSCOPY WITH PROPOFOL  N/A 09/04/2018   Procedure: COLONOSCOPY WITH PROPOFOL ;  Surgeon: Janalyn Keene NOVAK, MD;  Location: [facility] ENDOSCOPY;  Service: Endoscopy;  Laterality: N/A;   ESOPHAGOGASTRODUODENOSCOPY (EGD) WITH PROPOFOL  N/A 09/04/2018   Procedure: ESOPHAGOGASTRODUODENOSCOPY (EGD) WITH PROPOFOL ;  Surgeon: Janalyn Keene NOVAK, MD;  Location: [facility] ENDOSCOPY;  Service: Endoscopy;   Laterality: N/A;   LAPAROSCOPY     for endometriosis x2   MOUTH SURGERY  04/2023   tooth extraction, gum graft   RIGHT/LEFT HEART CATH AND CORONARY ANGIOGRAPHY Bilateral 09/19/2023   Procedure: RIGHT/LEFT HEART CATH AND CORONARY ANGIOGRAPHY;  Surgeon: Ammon Blunt, MD;  Location: [facility] INVASIVE CV LAB;  Service: Cardiovascular;  Laterality: Bilateral;   TONSILLECTOMY     TOTAL HIP ARTHROPLASTY Right 05/15/2022   Procedure: RIGHT TOTAL HIP ARTHROPLASTY ANTERIOR APPROACH;  Surgeon: Vernetta Lonni GRADE, MD;  Location: [facility] OR;  Service: Orthopedics;  Laterality: Right;   TOTAL KNEE ARTHROPLASTY Left 07/18/2021   Procedure: TOTAL KNEE ARTHROPLASTY;  Surgeon: Ernie Cough, MD;  Location: [facility];  Service: Orthopedics;  Laterality: Left;   TURBINATE REDUCTION     Patient Active Problem List   Diagnosis Date Noted   Elevated LFTs 10/10/2023   Protracted URI 05/03/2023   Acute non-recurrent pansinusitis 02/04/2023   OSA (obstructive sleep apnea) 11/14/2022   Arthritis 11/14/2022   Nocturia 10/05/2022   Hypersomnia 10/05/2022   S/P total hip arthroplasty 05/17/2022   Status post total replacement of right hip 05/15/2022   Overactive bladder 04/04/2022   Postmenopausal estrogen deficiency 11/13/2021   Primary osteoarthritis 11/13/2021  Leg length inequality 11/06/2021   Impaired ambulation 10/23/2021   Contact dermatitis 10/16/2021   SI joint arthritis (HCC) 08/24/2021   Spinal stenosis, lumbar region, without neurogenic claudication 08/24/2021   Degeneration of lumbar intervertebral disc 07/24/2021   S/P total knee arthroplasty, left 07/18/2021   Preop examination 07/07/2021   Right hip pain 07/07/2021   Low back pain 07/07/2021   Drug-induced immunodeficiency (HCC) 06/12/2021   Impingement syndrome of right shoulder region 06/12/2021   COVID-19 01/24/2021   Heart murmur 11/24/2020   Hammer toe 04/22/2020   Metatarsalgia of left foot 04/22/2020   Osteoarthritis of  midfoot 04/22/2020   Duodenal ulcer 11/12/2019   Stress 11/12/2019   Morbid obesity (HCC) 11/06/2019   Pain in left knee 07/30/2019   Aortic stenosis, moderate 02/06/2018   Chronic diarrhea 10/09/2017   Acid reflux 07/15/2017   Asymptomatic carotid artery stenosis 07/15/2017   Cyst of skin 07/15/2017   Right maxillary sinusitis 11/09/2016   Immunocompromised (HCC) 11/09/2016   Allergic rhinitis 11/02/2016   Benign essential tremor 11/02/2016   History of hypertension 11/02/2016   Postmenopausal symptoms 07/01/2015   Fatty liver 09/08/2007   Rheumatoid arthritis (HCC) 09/08/2007     REFERRING PROVIDER: Carilyn Prentice BRAVO, MD  REFERRING DIAG: [phone] (ICD-10-CM) - Chronic right shoulder pain  THERAPY DIAG:  Stiffness of right shoulder, not elsewhere classified  Right shoulder pain, unspecified chronicity  Muscle weakness (generalized)  Rationale for Evaluation and Treatment: Rehabilitation  ONSET DATE: Late 2024/January 2025  SUBJECTIVE:                                                                                                                                                                                      SUBJECTIVE STATEMENT:  Pt waiting for phone call to schedule heart valveprocedure. She report shaving more shoulder pain today than usual. 3-4/10 pain level.  Everything is easier.  I use my arm a lot more.  Pt states she has less pain and more ROM.  Pt is still limited. She is limited with reaching behind back and unable to reach overhead.      Hand dominance: Right  PERTINENT HISTORY: Chronic LBP, multilevel DDD, SI pain R THA 04/24 L TKA 06/23 RA Right Knee End-stage OA  Bladder incontinence    PAIN:  Location: 3-4/10 current, 7/10 worst, 0/10 best   PRECAUTIONS: RA, R THA, L TKA  RED FLAGS: Bowel or bladder incontinence: Yes:     WEIGHT BEARING RESTRICTIONS: No  FALLS:  Has patient fallen in last 6 months? No  LIVING  ENVIRONMENT: Lives with: lives alone Lives in: 2 story home though mainly lives on the 1st floor  Stairs: yes Has following equipment at home: chair lift on stairs, walking stick, FWW  OCCUPATION: retired  PLOF: Independent  PATIENT GOALS:to get rid of pain in shoulder and use her R arm.  NEXT MD VISIT:   OBJECTIVE:  Note: Objective measures were completed at Evaluation unless otherwise noted.  DIAGNOSTIC FINDINGS:  R shoulder x ray:   FINDINGS: There is diffuse decreased bone mineralization. Severe glenohumeral joint space narrowing with bone-on-bone contact, subchondral sclerosis, subchondral cystic change, and moderate superomedial humeral head and glenoid fossa cortical flattening/remodeling.   Mild acromioclavicular joint space narrowing and peripheral osteophytosis.   No acute fracture or dislocation.   The visualized portion of the right lung is unremarkable.   IMPRESSION: 1. Severe glenohumeral osteoarthritis. 2. Mild acromioclavicular osteoarthritis.   PATIENT SURVEYS:  UEFI: Initial/Current: 20/80  /  31/80  COGNITION: Overall cognitive status: Within functional limits for tasks assessed      UPPER EXTREMITY ROM:   AROM/PROM Right eval Left eval Right 7/16 Right 8/26  Shoulder flexion 62 with pain 152 78 with tightness and little pain  AAROM: (supine) 112  PROM:  116 78 with tightness and a little pain  AAROM:  (supine)  106  PROM: 119  Shoulder scaption 52 with pain 155 72 with pain 69 with pain  Shoulder abduction 38 with pain 138 64 with some pain 68 with little pain  Shoulder adduction      Shoulder internal rotation 47/44     Shoulder external rotation 24/22   16/18 26/22  Elbow flexion      Elbow extension      Wrist flexion      Wrist extension      Wrist ulnar deviation      Wrist radial deviation      Wrist pronation      Wrist supination      (Blank rows = not tested)  UPPER EXTREMITY MMT:  MMT Right eval Left eval   Shoulder flexion  5/5  Shoulder extension    Shoulder abduction  4+/5  Shoulder adduction    Shoulder internal rotation Tolerated min to mod resistance with pain WFL in sitting  Shoulder external rotation Tolerated min resistance with pain Tolerated mod resistance in sitting  Middle trapezius    Lower trapezius    Elbow flexion    Elbow extension    Wrist flexion    Wrist extension    Wrist ulnar deviation    Wrist radial deviation    Wrist pronation    Wrist supination    Grip strength (lbs)    (Blank rows = not tested)  SHOULDER SPECIAL TESTS: ER lag:  negative with arm at side                                                                                                                             TREATMENT:    10/18/23: PROM R shoulder Pulleys  Well arm assisted flexion  2x10 Supine chest press with cane 2x10 Seated scap retraction 2x10 Seated press out (partial) x10 Seated chicken wing 2x10 Standing ball rolls at table flexion 2x10  HEP update and review  10/01/23 Assessed ROM UEFI:  31/80  Pulleys in flexion and scaption Standing forward punch 2x10 Shelf reach to the 1st shelf 2x15 Supine ER AROM 2x10 Supine punches 3x10 ER/IR rhythmic stab's at 30 deg abd in supine 2x30 sec each Supine flexion AROM with PT assistance x 10 reps  Pt received R shoulder PROM in flexion and ER per pt and tissue tolerance.   PATIENT EDUCATION: Education details: dx, HEP, POC, exercise form, and rationale of interventions.   Person educated: Patient Education method: Explanation, Demonstration, Tactile cues, Verbal cues, and Handouts Education comprehension: verbalized understanding, returned demonstration, verbal cues required, tactile cues required, and needs further education  HOME EXERCISE PROGRAM: Access Code: [email or web address] URL: [email or web address] Date: 07/11/2023 Prepared by: Mose Minerva  Exercises - Seated Scapular Retraction  - 2 x daily - 7  x weekly - 2 sets - 10 reps - 3 second hold - Seated Shoulder Flexion Towel Slide at Table Top  - 2 x daily - 7 x weekly - 2 sets - 10 reps  ASSESSMENT:  CLINICAL IMPRESSION:  Pt awaiting heart valve replacement procedure. Instructed her to stop PT exercises following this procedure until she has medical clearance. Pt planning to have shoulder replacement in the future, once cleared by cardiologist. Today focused on PROM, gentle A/AROM and patient education. Reviewed exercises to perform at home and discussed avoiding pushing past pain limitations. Refer to updated objective measures taken for PN last session for most recent progress. Will d/c pt to independent HEP for her to work on until she has shoulder surgery.   PN:  Pt continues to report improved pain, sx's, and function stating Everything is easier.  Pt reports having less pain and more ROM.  Pt continues to have significant limitations in ROM and is unable to reach overhead.  Pt is currently not making much progress with shoulder ROM though reports improved daily ROM.  She is limited with reaching behind back and unable to reach overhead. Pt had no change in UEFI.  Pt hasn't made much progress toward goals except partially meeting STG #3 and progressing toward LTG #2.  Pt is planning on having shoulder surgery, though after having cardiac testing plans to have a cardiac procedure first.  Pt may benefit from 1 more visit for improved understanding and to establish independence with final HEP and to answer questions concerning post operative expectations and care.  PT updated goals.    OBJECTIVE IMPAIRMENTS: decreased activity tolerance, decreased ROM, decreased strength, hypomobility, impaired flexibility, impaired UE functional use, and pain.   ACTIVITY LIMITATIONS: bathing, dressing, reach over head, and hygiene/grooming  PARTICIPATION LIMITATIONS: meal prep, cleaning, and laundry  PERSONAL FACTORS: 1-2 comorbidities: RA and chronic  back pain are also affecting patient's functional outcome.   REHAB POTENTIAL: Good  CLINICAL DECISION MAKING: Stable/uncomplicated  EVALUATION COMPLEXITY: Low   GOALS:  SHORT TERM GOALS: Target date:  07/31/23   Pt will be independent and compliant with HEP for improved pain, ROM, strength, and function.  Baseline: Goal status: GOAL MET  2.  Pt will demo at least a 30 deg increase in flexion, scaption, and abd AROM for improved shoulder mobility and stiffness.  Baseline:  Goal status: 33% MET  8/26  3.  Pt will report at least a 25% improvement in performance of ADL's/self  care activities.  Baseline:  Goal status: GOAL MET  7/16  4.  Pt will be able to wash and dry her hair without significant difficulty.  Baseline:  Goal status: NOT MET Target date:  08/07/2023    LONG TERM GOALS: Target date:  10/22/23  Pt will be able to reach overhead into a cabinet without significant pain and difficulty.  Baseline:  Goal status: NOT MET  8/26  2.  Pt will be able to perform her ADLs and IADLs without significant difficulty and pain. Baseline:  Goal status: PROGRESSING  3.  Pt will demo improved R shoulder AROM to at least 130 deg in flexion and 50 deg in ER for improved reaching and performance of ADLs.  Baseline:  Goal status: NOT MET  8/26  4.  Pt will tolerate strengthening and stability exercises without adverse effects to improve R glenohumeral and scapular strength and stability to perform reaching activities, household chores, and functional lifting activities.   Baseline:  Goal status: ONGOING  5.  Pt will be independent with final HEP for improved strength, pain, function, and ROM and to understand appropriate exercises in preparation for surgery.  Goals Status:  MET 10/18/23    PLAN:  PT FREQUENCY: 1 more visit  PT DURATION:  3 weeks  PLANNED INTERVENTIONS: 97164- PT Re-evaluation, 97750- Physical Performance Testing, 97110-Therapeutic exercises, 97530-  Therapeutic activity, [id number]- Neuromuscular re-education, 97535- Self Care, [id number]- Manual therapy, [id number]- Electrical stimulation (unattended), [id number]- Electrical stimulation (manual), [id number]- Ultrasound, Patient/Family education, Taping, Cryotherapy, and Moist heat  PLAN FOR NEXT SESSION:  Pt to be discharged due to lack of progress and independence with HEP.  Pt is planning on having a cardiac procedure f/b shoulder replacement surgery.  Pt will cont with HEP until she has cardiac surgery.  PT spoke with pt and PTA today.      Asberry Rodes, PTA  10/18/23 10:54 AM  PHYSICAL THERAPY DISCHARGE SUMMARY  Visits from Start of Care: 20  Current functional level related to goals / functional outcomes: See above   Remaining deficits: See above   Education / Equipment: See above   Leigh Minerva III PT, DPT 10/18/23 10:10 PM

## 2023-10-18 NOTE — Addendum Note (Signed)
 Addended by: MARGRETTE LEIGH RAMAN on: 10/18/2023 10:11 PM   Modules accepted: Orders

## 2023-10-21 DIAGNOSIS — M9902 Segmental and somatic dysfunction of thoracic region: Secondary | ICD-10-CM | POA: Diagnosis not present

## 2023-10-21 DIAGNOSIS — M9904 Segmental and somatic dysfunction of sacral region: Secondary | ICD-10-CM | POA: Diagnosis not present

## 2023-10-21 DIAGNOSIS — M9903 Segmental and somatic dysfunction of lumbar region: Secondary | ICD-10-CM | POA: Diagnosis not present

## 2023-10-21 DIAGNOSIS — M9901 Segmental and somatic dysfunction of cervical region: Secondary | ICD-10-CM | POA: Diagnosis not present

## 2023-10-25 ENCOUNTER — Encounter: Payer: Self-pay | Admitting: Nurse Practitioner

## 2023-10-25 NOTE — Assessment & Plan Note (Signed)
 Managed with Orencia  infusions every four weeks, currently on hold due to potential surgery and immune suppression concerns. Continue holding Orencia  infusions until after heart surgery.

## 2023-10-25 NOTE — Assessment & Plan Note (Signed)
 Severe aortic stenosis requires surgical intervention. The cardiologist advised against any surgeries until this is addressed. She will meet with a cardiac surgeon at [facility] on Monday to discuss options.

## 2023-10-25 NOTE — Assessment & Plan Note (Signed)
 Liver enzymes are elevated, with alkaline phosphatase over 500 and AST at 302. Multiple scans, including MRCP, have been performed. A gastroenterologist has been consulted, and further evaluation is awaited. Potential medication-related causes, including Orencia  and Tylenol , are considered. Orencia  infusion is on hold due to potential surgery and immune suppression. Tylenol  intake was previously reduced without improvement. There is concern due to a family history of liver failure. Continue consultation with the gastroenterologist and encouraged to reduce Tylenol  intake.

## 2023-10-28 DIAGNOSIS — H524 Presbyopia: Secondary | ICD-10-CM | POA: Diagnosis not present

## 2023-10-28 DIAGNOSIS — H04123 Dry eye syndrome of bilateral lacrimal glands: Secondary | ICD-10-CM | POA: Diagnosis not present

## 2023-10-28 DIAGNOSIS — H16223 Keratoconjunctivitis sicca, not specified as Sjogren's, bilateral: Secondary | ICD-10-CM | POA: Diagnosis not present

## 2023-10-28 DIAGNOSIS — H5213 Myopia, bilateral: Secondary | ICD-10-CM | POA: Diagnosis not present

## 2023-10-28 DIAGNOSIS — H353131 Nonexudative age-related macular degeneration, bilateral, early dry stage: Secondary | ICD-10-CM | POA: Diagnosis not present

## 2023-10-28 DIAGNOSIS — H2513 Age-related nuclear cataract, bilateral: Secondary | ICD-10-CM | POA: Diagnosis not present

## 2023-10-29 DIAGNOSIS — M9903 Segmental and somatic dysfunction of lumbar region: Secondary | ICD-10-CM | POA: Diagnosis not present

## 2023-10-29 DIAGNOSIS — M9902 Segmental and somatic dysfunction of thoracic region: Secondary | ICD-10-CM | POA: Diagnosis not present

## 2023-10-29 DIAGNOSIS — M9901 Segmental and somatic dysfunction of cervical region: Secondary | ICD-10-CM | POA: Diagnosis not present

## 2023-10-29 DIAGNOSIS — M9904 Segmental and somatic dysfunction of sacral region: Secondary | ICD-10-CM | POA: Diagnosis not present

## 2023-11-01 ENCOUNTER — Other Ambulatory Visit: Payer: Self-pay | Admitting: Gastroenterology

## 2023-11-01 DIAGNOSIS — R748 Abnormal levels of other serum enzymes: Secondary | ICD-10-CM

## 2023-11-03 DIAGNOSIS — G4733 Obstructive sleep apnea (adult) (pediatric): Secondary | ICD-10-CM | POA: Diagnosis not present

## 2023-11-04 ENCOUNTER — Encounter (HOSPITAL_BASED_OUTPATIENT_CLINIC_OR_DEPARTMENT_OTHER): Admitting: Physical Therapy

## 2023-11-04 DIAGNOSIS — M9902 Segmental and somatic dysfunction of thoracic region: Secondary | ICD-10-CM | POA: Diagnosis not present

## 2023-11-04 DIAGNOSIS — M9901 Segmental and somatic dysfunction of cervical region: Secondary | ICD-10-CM | POA: Diagnosis not present

## 2023-11-04 DIAGNOSIS — M9904 Segmental and somatic dysfunction of sacral region: Secondary | ICD-10-CM | POA: Diagnosis not present

## 2023-11-04 DIAGNOSIS — M9903 Segmental and somatic dysfunction of lumbar region: Secondary | ICD-10-CM | POA: Diagnosis not present

## 2023-11-05 ENCOUNTER — Other Ambulatory Visit: Payer: Self-pay | Admitting: Radiology

## 2023-11-05 DIAGNOSIS — R7989 Other specified abnormal findings of blood chemistry: Secondary | ICD-10-CM

## 2023-11-06 ENCOUNTER — Telehealth: Payer: Self-pay | Admitting: Orthopedic Surgery

## 2023-11-06 NOTE — Telephone Encounter (Signed)
 Stiff soled walking shoes with wide toe box/extra depth   Looks like she may already have hokas

## 2023-11-06 NOTE — Telephone Encounter (Signed)
 This pt was seen in 07/2023 claw toe has to delay surgery for cards clearance and is asking if there is anything that she can do for her toe in the meanwhile? See message below.

## 2023-11-06 NOTE — Telephone Encounter (Signed)
 Patient called. Says her heart Dr. Leroy like to take care of her heart 1st before toe surgery. Says her toe is in a lot of pain. What can she do to decrease the pain? She can not walk much without pain. Would like a call.  Her CB# [phone]

## 2023-11-06 NOTE — Telephone Encounter (Signed)
 I called pt and advised of message below. Pt will call once she is ready to proceed.

## 2023-11-06 NOTE — Progress Notes (Signed)
 Patient for US  guided Liver Biopsy on Thurs 11/07/23, I called and spoke with the patient on the phone and gave pre-procedure instructions. Pt was made aware to be here at 10a, NPO after MN prior to procedure as well as driver post procedure/recovery/discharge. Pt stated understanding.  Called 11/06/23

## 2023-11-07 ENCOUNTER — Other Ambulatory Visit: Payer: Self-pay

## 2023-11-07 ENCOUNTER — Ambulatory Visit
Admission: RE | Admit: 2023-11-07 | Discharge: 2023-11-07 | Disposition: A | Discharge status: Home / Self Care | Source: Ambulatory Visit | Attending: Gastroenterology | Admitting: Gastroenterology

## 2023-11-07 DIAGNOSIS — R7989 Other specified abnormal findings of blood chemistry: Secondary | ICD-10-CM

## 2023-11-07 DIAGNOSIS — R896 Abnormal cytological findings in specimens from other organs, systems and tissues: Secondary | ICD-10-CM | POA: Diagnosis not present

## 2023-11-07 DIAGNOSIS — R748 Abnormal levels of other serum enzymes: Secondary | ICD-10-CM | POA: Diagnosis not present

## 2023-11-07 DIAGNOSIS — M069 Rheumatoid arthritis, unspecified: Secondary | ICD-10-CM | POA: Diagnosis not present

## 2023-11-07 DIAGNOSIS — R7401 Elevation of levels of liver transaminase levels: Secondary | ICD-10-CM | POA: Diagnosis not present

## 2023-11-07 DIAGNOSIS — K7581 Nonalcoholic steatohepatitis (NASH): Secondary | ICD-10-CM | POA: Insufficient documentation

## 2023-11-07 LAB — PROTIME-INR
INR: 1 (ref 0.8–1.2)
Prothrombin Time: 13.9 s (ref 11.4–15.2)

## 2023-11-07 LAB — CBC
HCT: 43.5 % (ref 36.0–46.0)
Hemoglobin: 14.5 g/dL (ref 12.0–15.0)
MCH: 31.1 pg (ref 26.0–34.0)
MCHC: 33.3 g/dL (ref 30.0–36.0)
MCV: 93.3 fL (ref 80.0–100.0)
Platelets: 142 K/uL — ABNORMAL LOW (ref 150–400)
RBC: 4.66 MIL/uL (ref 3.87–5.11)
RDW: 13.2 % (ref 11.5–15.5)
WBC: 6.7 K/uL (ref 4.0–10.5)
nRBC: 0 % (ref 0.0–0.2)

## 2023-11-07 MED ORDER — SODIUM CHLORIDE 0.9 % IV SOLN: INTRAVENOUS | Status: DC

## 2023-11-07 MED ORDER — OXYCODONE HCL 5 MG PO TABS: 10.0000 mg | ORAL_TABLET | Freq: Four times a day (QID) | ORAL | Status: DC | PRN

## 2023-11-07 MED ORDER — MIDAZOLAM HCL 2 MG/2ML IJ SOLN
INTRAMUSCULAR | Status: AC
  Filled 2023-11-07: qty 2

## 2023-11-07 MED ORDER — FENTANYL CITRATE (PF) 100 MCG/2ML IJ SOLN
INTRAMUSCULAR | Status: AC | PRN
  Administered 2023-11-07 (×2): 50 ug via INTRAVENOUS

## 2023-11-07 MED ORDER — FENTANYL CITRATE (PF) 100 MCG/2ML IJ SOLN
INTRAMUSCULAR | Status: AC
  Filled 2023-11-07: qty 2

## 2023-11-07 MED ORDER — LIDOCAINE HCL (PF) 1 % IJ SOLN
10.0000 mL | Freq: Once | INTRAMUSCULAR | Status: AC
  Administered 2023-11-07: 10 mL via INTRADERMAL
  Filled 2023-11-07: qty 10

## 2023-11-07 NOTE — Discharge Instructions (Signed)
 Liver Biopsy, Care After These instructions give you information about how to care for yourself after your procedure. Your health care provider may also give you more specific instructions. If you have problems or questions, contact your health care provider. What can I expect after the procedure? After your procedure, it is common to have: Pain and soreness in the area where the biopsy was done. Bruising around the area where the biopsy was done. Sleepiness and fatigue for 1-2 days. Follow these instructions at home: Medicines Take over-the-counter and prescription medicines only as told by your health care provider. If you were prescribed an antibiotic medicine, take it as told by your health care provider. Do not stop taking the antibiotic even if you start to feel better. Do not take medicines such as aspirin and ibuprofen unless your health care provider tells you to take them. These medicines thin your blood and can increase the risk of bleeding. If you are taking prescription pain medicine, take actions to prevent or treat constipation. Your health care provider may recommend that you: Drink enough fluid to keep your urine pale yellow. Eat foods that are high in fiber, such as fresh fruits and vegetables, whole grains, and beans. Limit foods that are high in fat and processed sugars, such as fried or sweet foods. Take an over-the-counter or prescription medicine for constipation. Incision care Wash your hands with soap and water before you change your bandage (dressing). If soap and water are not available, use hand sanitizer. Change your bandage tomorrow after you shower and then remove the next day. Check your incision area every day for signs of infection. Check for: Redness, swelling, or pain. Fluid or blood. Warmth. Pus or a bad smell. You may shower tomorrow No lifting more than 5 lbs for 3 days Return to your normal activities as told by your health care provider.  Do not  drive or use heavy machinery for 24hrs or while taking prescription pain medicine. Do not play contact sports for 2 weeks after the procedure. General instructions  Do not drink alcohol in the first week after the procedure. Have someone stay with you for at least 24 hours after the procedure. It is your responsibility to obtain your test results. Ask your health care provider, or the department that is doing the test: When will my results be ready? How will I get my results? What are my treatment options? What other tests do I need? What are my next steps? Keep all follow-up visits as told by your health care provider. This is important. Contact a health care provider if: You have increased bleeding from an incision, resulting in more than a small spot of blood. You have redness, swelling, or increasing pain in any incisions. You notice a discharge or a bad smell coming from any of your incisions. You have a fever or chills. Get help right away if: You develop swelling, bloating, or pain in your abdomen. You become dizzy or faint. You develop a rash. You have nausea or you vomit. You faint, or you have shortness of breath or difficulty breathing. You develop chest pain. You have problems with your speech or vision. You have trouble with your balance or moving your arms or legs. Summary After the liver biopsy, it is common to have pain, soreness, and bruising in the area, as well as sleepiness and fatigue. Take over-the-counter and prescription medicines only as told by your health care provider. Follow instructions from your health care provider about how  to care for your incision. Check the incision area daily for signs of infection. This information is not intended to replace advice given to you by your health care provider. Make sure you discuss any questions you have with your health care provider. Document Released: 08/11/2004 Document Revised: 03/17/2018 Document Reviewed:  02/01/2017 Elsevier Patient Education  2020 [employer].

## 2023-11-07 NOTE — Procedures (Signed)
 Vascular and Interventional Radiology Procedure Note  Patient: Wanda Lang DOB: Jul 01, 1950 Medical Record Number: [id number] Note Date/Time: 11/07/23 11:38 AM   Performing Physician: Thom Hall, MD Assistant(s): None  Diagnosis: >LFTs   Procedure: LIVER BIOPSY, NON-TARGET  Anesthesia: Conscious Sedation Complications: None Estimated Blood Loss: Minimal Specimens: Sent for Pathology  Findings:  Successful Ultrasound-guided biopsy of liver. A total of 3 samples were obtained. Hemostasis of the tract was achieved using Manual Pressure.  Plan: Bed rest for 2 hours.  See detailed procedure note with images in PACS. The patient tolerated the procedure well without incident or complication and was returned to Recovery in stable condition.    Thom Hall, MD [facility]   Pager. [phone] Clinic. [phone]

## 2023-11-07 NOTE — H&P (Signed)
 Chief Complaint: Patient was seen in consultation today for elevated liver enzymes   Procedure: Liver biopsy  Referring Physician(s): Unk Corinn Skiff  Supervising Physician: Hughes Simmonds  Patient Status: [facility] - Out-pt  History of Present Illness: Wanda Lang is a 73 y.o. female with a history of rheumatoid arthritis and elevated liver enzymes since June 2025. She was referred to GI for further workup who initially thought her enzyme elevation might be secondary to some of her RA medications. However, despite being off her medication, Orencia , for several months, her liver enzymes have continued to increase. She underwent MRCP in August to evaluation for possible biliary pathology; however, this returned within normal limits. She underwent further lab testing for further evaluation which revealed elevated autoimmune markers concerning for autoimmune hepatitis. She was subsequently referred to IR for liver biopsy.   She is currently resting in bed in no acute distress. Patient denies any symptoms or concerns at this time. Denies any chest pain, shortness of breath, fevers/chills, nausea/vomiting, or abdominal pain. She is not on any anticoagulation. NPO since midnight. All questions and concerns answered at the bedside.   Code Status: Full Code  Past Medical History:  Diagnosis Date   Allergy    Arthritis    Coronary artery disease    Fatty liver    GERD (gastroesophageal reflux disease)    Heart murmur    Hypertension    Hx   LIVER FUNCTION TESTS, ABNORMAL, HX OF 09/08/2007   Qualifier: Diagnosis of  By: Nelson-Smith CMA (AAMA), Dottie     Neuromuscular disorder [facility])    Essential Tremor   Overactive bladder     Past Surgical History:  Procedure Laterality Date   ABDOMINAL HYSTERECTOMY     COLONOSCOPY WITH PROPOFOL  N/A 09/04/2018   Procedure: COLONOSCOPY WITH PROPOFOL ;  Surgeon: Janalyn Keene NOVAK, MD;  Location: [facility] ENDOSCOPY;  Service: Endoscopy;  Laterality:  N/A;   ESOPHAGOGASTRODUODENOSCOPY (EGD) WITH PROPOFOL  N/A 09/04/2018   Procedure: ESOPHAGOGASTRODUODENOSCOPY (EGD) WITH PROPOFOL ;  Surgeon: Janalyn Keene NOVAK, MD;  Location: [facility] ENDOSCOPY;  Service: Endoscopy;  Laterality: N/A;   LAPAROSCOPY     for endometriosis x2   MOUTH SURGERY  04/2023   tooth extraction, gum graft   RIGHT/LEFT HEART CATH AND CORONARY ANGIOGRAPHY Bilateral 09/19/2023   Procedure: RIGHT/LEFT HEART CATH AND CORONARY ANGIOGRAPHY;  Surgeon: Ammon Blunt, MD;  Location: [facility] INVASIVE CV LAB;  Service: Cardiovascular;  Laterality: Bilateral;   TONSILLECTOMY     TOTAL HIP ARTHROPLASTY Right 05/15/2022   Procedure: RIGHT TOTAL HIP ARTHROPLASTY ANTERIOR APPROACH;  Surgeon: Vernetta Lonni GRADE, MD;  Location: [facility] OR;  Service: Orthopedics;  Laterality: Right;   TOTAL KNEE ARTHROPLASTY Left 07/18/2021   Procedure: TOTAL KNEE ARTHROPLASTY;  Surgeon: Ernie Cough, MD;  Location: [facility] ORS;  Service: Orthopedics;  Laterality: Left;   TURBINATE REDUCTION      Allergies: Penicillins, Amoxicillin, Latex, Misc. sulfonamide containing compounds, Penicillin g, Rosanil cleanser [sulfacetamide sodium-sulfur], Sulfonamide derivatives, Wound dressing adhesive, and Ylang-ylang [cananga oil (ylang-ylang)]  Medications: Prior to Admission medications   Medication Sig Start Date End Date Taking? Authorizing Provider  Ascorbic Acid (VITAMIN C PO) Take 500 mg by mouth daily.   Yes [provider]  b complex vitamins capsule Take 1 capsule by mouth daily. With vit C   Yes [provider]  CALCIUM  PO Take 1,200 mg by mouth daily.   Yes [provider]  celecoxib  (CELEBREX ) 200 MG capsule Take 200 mg by mouth daily.   Yes [provider]  cholecalciferol (VITAMIN D3) 25 MCG (1000 UNIT) tablet Take 4,000 Units by mouth daily.   Yes [provider]  cycloSPORINE  (RESTASIS ) 0.05 % ophthalmic emulsion Place 1 drop into both eyes 2 (two) times  daily.   Yes [provider]  Estradiol (VAGIFEM) 10 MCG TABS vaginal tablet Take 10 mcg by mouth 2 (two) times a week.   Yes [provider]  fexofenadine (ALLEGRA) 180 MG tablet Take 180 mg by mouth daily.   Yes [provider]  Glucosamine HCl 1500 MG TABS Take 1,500 mg by mouth daily.   Yes [provider]  Iodine  Strong, Lugols, (IODINE  STRONG PO) Take 1 Dose by mouth daily. 1950 mcg. 3 drops per day.   Yes [provider]  MAGNESIUM  PO Take 1,250 mg by mouth 2 (two) times a week. Glycinate   Yes [provider]  Multiple Vitamin (MULTIVITAMIN WITH MINERALS) TABS tablet Take 1 tablet by mouth daily.   Yes [provider]  Omega-3 Fatty Acids (FISH OIL PO) Take 750 mg by mouth daily.   Yes [provider]  Abatacept  (ORENCIA  IV) Inject 1 application  into the vein every 30 (thirty) days.    [provider]  acetaminophen  (TYLENOL ) 500 MG tablet Take 1,000 mg by mouth 2 (two) times daily.    [provider]  lidocaine  (LIDODERM ) 5 % Place 2-3 patches onto the skin daily.    [provider]  methocarbamol  (ROBAXIN ) 750 MG tablet Take 750 mg by mouth at bedtime.    [provider]  OVER THE COUNTER MEDICATION Apply 1 Application topically daily at 12 noon. frankincense    [provider]  oxyCODONE  (OXY IR/ROXICODONE ) 5 MG immediate release tablet Take 5 mg by mouth at bedtime. 05/15/23   [provider]  trolamine salicylate (BLUE-EMU HEMP) 10 % cream Apply 1 Application topically as needed for muscle pain.    [provider]     Family History  Problem Relation Age of Onset   Alcohol abuse Mother    Arthritis Mother    Hyperlipidemia Mother    Heart disease Mother    Stroke Mother    Hypertension Mother    Depression Mother    Anxiety disorder Mother    Arthritis Father    Lung cancer Paternal Grandfather    Kidney disease Paternal Grandfather      Social History   Socioeconomic History   Marital status: Widowed    Spouse name: Not on file   Number of children: Not on file   Years of education: Not on file   Highest education level: Bachelor's degree (e.g., BA, AB, BS)  Occupational History   Not on file  Tobacco Use   Smoking status: Never    Passive exposure: Never   Smokeless tobacco: Never  Vaping Use   Vaping status: Never Used  Substance and Sexual Activity   Alcohol use: Not Currently    Alcohol/week: 1.0 standard drink of alcohol    Types: 1 Glasses of wine per week   Drug use: No   Sexual activity: Not Currently  Other Topics Concern   Not on file  Social History Narrative   Not on file   Social Drivers of Health   Financial Resource Strain: Low Risk  (10/16/2023)   Received from [facility]   Overall Financial Resource Strain (CARDIA)    Difficulty of Paying Living Expenses: Not hard at all  Food Insecurity: No  Food Insecurity (10/16/2023)   Received from [facility]   Hunger Vital Sign    Within the past 12 months, you worried that your food would run out before you got the money to buy more.: Never true    Within the past 12 months, the food you bought just didn't last and you didn't have money to get more.: Never true  Transportation Needs: No Transportation Needs (10/16/2023)   Received from [facility] - Transportation    In the past 12 months, has lack of transportation kept you from medical appointments or from getting medications?: No    Lack of Transportation (Non-Medical): No  Physical Activity: Inactive (05/02/2023)   Exercise Vital Sign    Days of Exercise per Week: 0 days    Minutes of Exercise per Session: 20 min  Stress: No Stress Concern Present (05/02/2023)   [employer] of Occupational Health - Occupational Stress Questionnaire    Feeling of Stress : Only a little  Social Connections: Moderately Integrated  (05/02/2023)   Social Connection and Isolation Panel    Frequency of Communication with Friends and Family: More than three times a week    Frequency of Social Gatherings with Friends and Family: More than three times a week    Attends Religious Services: More than 4 times per year    Active Member of [employer] or Organizations: Yes    Attends Engineer, structural: More than 4 times per year    Marital Status: Divorced    Review of Systems Denies any N/V, chest pain, shortness of breath, fevers/chills. All other ROS negative.  Vital Signs: BP (!) 131/93   Pulse 78   Temp 98.2 F (36.8 C) (Oral)   Resp 20   Ht 5' 4 (1.626 m)   Wt 230 lb 2.6 oz (104.4 kg)   SpO2 95%   BMI 39.51 kg/m    Physical Exam Constitutional:      Appearance: Normal appearance.  HENT:     Mouth/Throat:     Mouth: Mucous membranes are moist.     Pharynx: Oropharynx is clear.  Cardiovascular:     Rate and Rhythm: Normal rate and regular rhythm.     Heart sounds: Normal heart sounds.  Pulmonary:     Effort: Pulmonary effort is normal.     Breath sounds: Normal breath sounds.  Abdominal:     General: Abdomen is flat.     Palpations: Abdomen is soft.     Tenderness: There is no abdominal tenderness.  Skin:    General: Skin is warm and dry.  Neurological:     Mental Status: She is alert and oriented to person, place, and time.  Psychiatric:        Behavior: Behavior normal.     Imaging: No results found.  Labs:  CBC: Recent Labs    06/05/23 1537 09/19/23 1011 09/19/23 1012 11/07/23 1011  WBC 6.9  --   --  6.7  HGB 13.2 13.3 12.9 14.5  HCT 40.5 39.0 38.0 43.5  PLT 232.0  --   --  142*    COAGS: Recent Labs    11/07/23 1011  INR 1.0    BMP: Recent Labs    06/05/23 1537 08/05/23 1440 09/19/23 1011 09/19/23 1012  NA 142  --  142 143  K 4.2  --  3.6 3.5  CL 107  --   --   --   CO2 28  --   --   --  GLUCOSE 126*  --   --   --   BUN 21  --   --   --   CALCIUM  9.1  8.9  --   --   CREATININE 0.79  --   --   --     LIVER FUNCTION TESTS: Recent Labs    06/05/23 1537 08/01/23 1128 08/14/23 1039 09/09/23 1632 10/09/23 1225  BILITOT 0.4 0.8  --  0.9 1.7*  AST 21 111*  --  225* 302*  ALT 16 160*  --  191* 214*  ALKPHOS 102 445* 448* 399* 532*  PROT 6.5 6.7  --  7.1 7.1  ALBUMIN  3.9 3.9  --  3.6 3.6    TUMOR MARKERS: No results for input(s): AFPTM, CEA, CA199, CHROMGRNA in the last 8760 hours.  Assessment and Plan:  Elevated liver enzymes: HAILLEY BYERS is a 73 y.o. female with a history of elevated liver enzymes since June 2025 and recently found to have elevated autoimmune markers concerning for autoimmune hepatitis. Patient presents to [facility] Interventional Radiology department for an image-guided liver biopsy with Dr. JINNY Hall. Procedure to be performed under moderate sedation.  -Not on AC -NPO since midnight -INR 1.0 -Plan for liver biopsy in US    Risks and benefits of liver biopsy was discussed with the patient and/or patient's family including, but not limited to bleeding, infection, damage to adjacent structures or low yield requiring additional tests.  All of the questions were answered and there is agreement to proceed.  Consent signed and in chart.   Thank you for this interesting consult. I greatly enjoyed meeting DOLOREZ JEFFREY and look forward to participating in their care. A copy of this report was sent to the requesting provider on this date.  Electronically Signed: Glennon CHRISTELLA Bal, PA-C 11/07/2023, 12:37 PM   I spent a total of  40 Minutes in face to face clinical consultation, greater than 50% of which was counseling/coordinating care for liver biopsy.

## 2023-11-07 NOTE — Progress Notes (Signed)
 Patient clinically stable post US  Liver biopsy/random per Dr Hughes. Tolerated well, Fentanyl  100 mcg IV given for procedure. Denies complaints immediately post procedure. Report given to Jerel Cashing Rn/specials/2.

## 2023-11-11 DIAGNOSIS — M5030 Other cervical disc degeneration, unspecified cervical region: Secondary | ICD-10-CM | POA: Diagnosis not present

## 2023-11-11 DIAGNOSIS — M9905 Segmental and somatic dysfunction of pelvic region: Secondary | ICD-10-CM | POA: Diagnosis not present

## 2023-11-11 DIAGNOSIS — M9903 Segmental and somatic dysfunction of lumbar region: Secondary | ICD-10-CM | POA: Diagnosis not present

## 2023-11-11 DIAGNOSIS — M9901 Segmental and somatic dysfunction of cervical region: Secondary | ICD-10-CM | POA: Diagnosis not present

## 2023-11-11 DIAGNOSIS — M9904 Segmental and somatic dysfunction of sacral region: Secondary | ICD-10-CM | POA: Diagnosis not present

## 2023-11-11 DIAGNOSIS — M9902 Segmental and somatic dysfunction of thoracic region: Secondary | ICD-10-CM | POA: Diagnosis not present

## 2023-11-12 DIAGNOSIS — R7989 Other specified abnormal findings of blood chemistry: Secondary | ICD-10-CM | POA: Diagnosis not present

## 2023-11-12 DIAGNOSIS — M069 Rheumatoid arthritis, unspecified: Secondary | ICD-10-CM | POA: Diagnosis not present

## 2023-11-12 DIAGNOSIS — I10 Essential (primary) hypertension: Secondary | ICD-10-CM | POA: Diagnosis not present

## 2023-11-12 DIAGNOSIS — M05741 Rheumatoid arthritis with rheumatoid factor of right hand without organ or systems involvement: Secondary | ICD-10-CM | POA: Diagnosis not present

## 2023-11-12 DIAGNOSIS — K760 Fatty (change of) liver, not elsewhere classified: Secondary | ICD-10-CM | POA: Diagnosis not present

## 2023-11-12 DIAGNOSIS — I350 Nonrheumatic aortic (valve) stenosis: Secondary | ICD-10-CM | POA: Diagnosis not present

## 2023-11-12 DIAGNOSIS — Z952 Presence of prosthetic heart valve: Secondary | ICD-10-CM | POA: Diagnosis not present

## 2023-11-12 DIAGNOSIS — Z96611 Presence of right artificial shoulder joint: Secondary | ICD-10-CM | POA: Diagnosis not present

## 2023-11-12 DIAGNOSIS — G4733 Obstructive sleep apnea (adult) (pediatric): Secondary | ICD-10-CM | POA: Diagnosis not present

## 2023-11-12 DIAGNOSIS — Z006 Encounter for examination for normal comparison and control in clinical research program: Secondary | ICD-10-CM | POA: Diagnosis not present

## 2023-11-12 DIAGNOSIS — Z6841 Body Mass Index (BMI) 40.0 and over, adult: Secondary | ICD-10-CM | POA: Diagnosis not present

## 2023-11-12 DIAGNOSIS — R0602 Shortness of breath: Secondary | ICD-10-CM | POA: Diagnosis not present

## 2023-11-12 DIAGNOSIS — Z01818 Encounter for other preprocedural examination: Secondary | ICD-10-CM | POA: Diagnosis not present

## 2023-11-12 DIAGNOSIS — E7489 Other specified disorders of carbohydrate metabolism: Secondary | ICD-10-CM | POA: Diagnosis not present

## 2023-11-12 DIAGNOSIS — M05742 Rheumatoid arthritis with rheumatoid factor of left hand without organ or systems involvement: Secondary | ICD-10-CM | POA: Diagnosis not present

## 2023-11-13 DIAGNOSIS — I350 Nonrheumatic aortic (valve) stenosis: Secondary | ICD-10-CM | POA: Diagnosis not present

## 2023-11-13 HISTORY — PX: CARDIAC VALVE REPLACEMENT: SHX585

## 2023-11-13 LAB — SURGICAL PATHOLOGY

## 2023-11-14 DIAGNOSIS — Z952 Presence of prosthetic heart valve: Secondary | ICD-10-CM | POA: Diagnosis not present

## 2023-11-15 DIAGNOSIS — G4733 Obstructive sleep apnea (adult) (pediatric): Secondary | ICD-10-CM | POA: Diagnosis not present

## 2023-11-20 ENCOUNTER — Ambulatory Visit: Payer: Self-pay | Admitting: Gastroenterology

## 2023-11-20 NOTE — Progress Notes (Signed)
 Liver biopsy results confirm that she has fatty liver and early/evolving autoimmune hepatitis Recommend budesonide 9 mg daily for 1 month and repeat LFTs in 1 month Clinic follow-up in next 1 to 2 months  RV

## 2023-11-25 DIAGNOSIS — R0609 Other forms of dyspnea: Secondary | ICD-10-CM | POA: Diagnosis not present

## 2023-11-25 DIAGNOSIS — I3481 Nonrheumatic mitral (valve) annulus calcification: Secondary | ICD-10-CM | POA: Diagnosis not present

## 2023-11-25 DIAGNOSIS — Z952 Presence of prosthetic heart valve: Secondary | ICD-10-CM | POA: Diagnosis not present

## 2023-11-25 DIAGNOSIS — G4733 Obstructive sleep apnea (adult) (pediatric): Secondary | ICD-10-CM | POA: Diagnosis not present

## 2023-11-25 DIAGNOSIS — I10 Essential (primary) hypertension: Secondary | ICD-10-CM | POA: Diagnosis not present

## 2023-12-04 ENCOUNTER — Encounter: Attending: Cardiology | Admitting: *Deleted

## 2023-12-04 DIAGNOSIS — M1712 Unilateral primary osteoarthritis, left knee: Secondary | ICD-10-CM | POA: Diagnosis not present

## 2023-12-04 DIAGNOSIS — Z952 Presence of prosthetic heart valve: Secondary | ICD-10-CM

## 2023-12-04 DIAGNOSIS — M1711 Unilateral primary osteoarthritis, right knee: Secondary | ICD-10-CM | POA: Diagnosis not present

## 2023-12-04 NOTE — Progress Notes (Signed)
 Initial phone call completed. Diagnosis can be found in [facility] 10/20. EP Orientation scheduled for Monday 11/3 at 1:45pm.

## 2023-12-05 ENCOUNTER — Encounter: Payer: Self-pay | Admitting: Nurse Practitioner

## 2023-12-05 ENCOUNTER — Ambulatory Visit: Admitting: Nurse Practitioner

## 2023-12-05 VITALS — BP 134/82 | HR 83 | Temp 98.2°F | Ht 64.0 in | Wt 226.8 lb

## 2023-12-05 DIAGNOSIS — Z952 Presence of prosthetic heart valve: Secondary | ICD-10-CM | POA: Insufficient documentation

## 2023-12-05 DIAGNOSIS — T148XXA Other injury of unspecified body region, initial encounter: Secondary | ICD-10-CM | POA: Insufficient documentation

## 2023-12-05 DIAGNOSIS — K760 Fatty (change of) liver, not elsewhere classified: Secondary | ICD-10-CM

## 2023-12-05 NOTE — Assessment & Plan Note (Signed)
 She underwent a successful TAVR with a bovine pericardial tissue valve, resulting in significant symptom improvement. Experiencing expected fatigue, she has started cardiac rehabilitation. Follow-up appointments are scheduled. Continue cardiac rehabilitation two to three times a week for 36 visits. Attend scheduled follow-up appointments with cardiology and cardiothoracic surgery teams.

## 2023-12-05 NOTE — Progress Notes (Signed)
 Leron Glance, NP-C Phone: [phone]  Wanda Lang is a 73 y.o. female who presents today for follow up.   Discussed the use of AI scribe software for clinical note transcription with the patient, who gave verbal consent to proceed.  History of Present Illness   Wanda Lang is a 73 year old female who presents for follow-up after aortic valve replacement surgery.  She underwent aortic valve replacement (TAVR) on November 13, 2023, using bovine pericardial tissue. Post-surgery, she notes significant improvement in her overall condition, feeling much better and able to engage in more activities than before. However, she experiences fatigue towards the end of the day. She has started cardiac rehabilitation with a telephone meeting and plans to attend in-person sessions starting next week, with a schedule of two to three times a week for a total of 36 visits.  She reports a significant improvement in her bladder incontinence, which she previously managed with medications and protective wear. She speculates that the improvement may be due to better oxygenation post-surgery, as she feels better overall.  She had a liver biopsy on November 07, 2023, which showed fatty liver. Her GI specialist suspects she may also have early evolving autoimmune hepatitis. She has been on budesonide daily for a month and plans to follow up with her GI specialist. She has stopped taking certain herbal supplements, particularly curcumin, after learning they might contribute to liver damage. She continues to take vitamin supplements.  She expresses concern with the healing of the incision site from her heart procedure. The incision on one side healed well, but the other side, which required a stitch, has been oozing a pinkish liquid and occasionally blood. She keeps the area dry with a cloth and notes that the stitch is irritating.      Social History   Tobacco Use  Smoking Status Never   Passive exposure:  Never  Smokeless Tobacco Never    Current Outpatient Medications on File Prior to Visit  Medication Sig Dispense Refill   Abatacept  (ORENCIA  IV) Inject 1 application  into the vein every 30 (thirty) days.     Ascorbic Acid (VITAMIN C PO) Take 500 mg by mouth daily.     aspirin  EC 81 MG tablet Take 81 mg by mouth daily.     b complex vitamins capsule Take 1 capsule by mouth daily. With vit C     budesonide (ENTOCORT EC) 3 MG 24 hr capsule Take 9 mg by mouth.     CALCIUM  PO Take 1,200 mg by mouth daily.     celecoxib  (CELEBREX ) 200 MG capsule Take 200 mg by mouth daily.     cholecalciferol (VITAMIN D3) 25 MCG (1000 UNIT) tablet Take 4,000 Units by mouth daily.     cycloSPORINE  (RESTASIS ) 0.05 % ophthalmic emulsion Place 1 drop into both eyes 2 (two) times daily.     Estradiol (VAGIFEM) 10 MCG TABS vaginal tablet Take 10 mcg by mouth 2 (two) times a week.     Glucosamine HCl 1500 MG TABS Take 1,500 mg by mouth daily.     Iodine  Strong, Lugols, (IODINE  STRONG PO) Take 1 Dose by mouth daily. 1950 mcg. 3 drops per day.     lidocaine  (LIDODERM ) 5 % Place 2-3 patches onto the skin daily.     MAGNESIUM  PO Take 1,250 mg by mouth 2 (two) times a week. Glycinate     Multiple Vitamin (MULTIVITAMIN WITH MINERALS) TABS tablet Take 1 tablet by mouth daily.  Omega-3 Fatty Acids (FISH OIL PO) Take 750 mg by mouth daily.     OVER THE COUNTER MEDICATION Apply 1 Application topically daily at 12 noon. frankincense     oxyCODONE  (OXY IR/ROXICODONE ) 5 MG immediate release tablet Take 5 mg by mouth at bedtime.     senna-docusate (SENOKOT-S) 8.6-50 MG tablet Take 1 tablet by mouth.     trolamine salicylate (BLUE-EMU HEMP) 10 % cream Apply 1 Application topically as needed for muscle pain.     acetaminophen  (TYLENOL ) 500 MG tablet Take 1,000 mg by mouth 2 (two) times daily. (Patient not taking: Reported on 12/05/2023)     fexofenadine (ALLEGRA) 180 MG tablet Take 180 mg by mouth daily. (Patient not taking:  Reported on 12/05/2023)     methocarbamol  (ROBAXIN ) 750 MG tablet Take 750 mg by mouth at bedtime. (Patient not taking: Reported on 12/05/2023)     prednisoLONE acetate (PRED FORTE) 1 % ophthalmic suspension 3 (three) times daily (Patient not taking: Reported on 12/05/2023)     No current facility-administered medications on file prior to visit.     ROS see history of present illness  Objective  Physical Exam Vitals:   12/05/23 0955  BP: 134/82  Pulse: 83  Temp: 98.2 F (36.8 C)  SpO2: 98%    BP Readings from Last 3 Encounters:  12/05/23 134/82  11/07/23 (!) 104/59  10/10/23 108/78   Wt Readings from Last 3 Encounters:  12/05/23 226 lb 12.8 oz (102.9 kg)  11/07/23 230 lb 2.6 oz (104.4 kg)  10/10/23 230 lb 3.2 oz (104.4 kg)    Physical Exam Constitutional:      General: She is not in acute distress.    Appearance: Normal appearance.  HENT:     Head: Normocephalic.  Cardiovascular:     Rate and Rhythm: Normal rate and regular rhythm.     Heart sounds: Normal heart sounds.  Pulmonary:     Effort: Pulmonary effort is normal.     Breath sounds: Normal breath sounds.  Skin:    General: Skin is warm and dry.         Comments: Incision site with mild erythema, very small amount of drainage and blood. Stitch present. No tenderness.   Neurological:     General: No focal deficit present.     Mental Status: She is alert.  Psychiatric:        Mood and Affect: Mood normal.        Behavior: Behavior normal.      Assessment/Plan: Please see individual problem list.  S/P TAVR (transcatheter aortic valve replacement) Assessment & Plan: She underwent a successful TAVR with a bovine pericardial tissue valve, resulting in significant symptom improvement. Experiencing expected fatigue, she has started cardiac rehabilitation. Follow-up appointments are scheduled. Continue cardiac rehabilitation two to three times a week for 36 visits. Attend scheduled follow-up appointments  with cardiology and cardiothoracic surgery teams.   Skin wound from surgical incision Assessment & Plan: One vascular access site has healed well, while the other has a loose stitch causing minor bleeding and irritation, with no signs of infection present. Contact the cardiology team to discuss stitch management and any necessary interventions before the next follow-up appointment. Return precautions given to patient.    Fatty liver Assessment & Plan: She has confirmed fatty liver disease and suspected autoimmune hepatitis. A liver biopsy suggested possible medication or supplement-induced damage, leading to the cessation of certain herbal supplements. Continue budesonide as prescribed for one month. Repeat liver  function tests as scheduled. Follow up with gastroenterology for further management and to review the list of current supplements.      Return in about 6 months (around 06/04/2024) for Follow up.   Leron Glance, NP-C [facility]

## 2023-12-05 NOTE — Assessment & Plan Note (Signed)
 One vascular access site has healed well, while the other has a loose stitch causing minor bleeding and irritation, with no signs of infection present. Contact the cardiology team to discuss stitch management and any necessary interventions before the next follow-up appointment. Return precautions given to patient.

## 2023-12-05 NOTE — Assessment & Plan Note (Signed)
 She has confirmed fatty liver disease and suspected autoimmune hepatitis. A liver biopsy suggested possible medication or supplement-induced damage, leading to the cessation of certain herbal supplements. Continue budesonide as prescribed for one month. Repeat liver function tests as scheduled. Follow up with gastroenterology for further management and to review the list of current supplements.

## 2023-12-09 ENCOUNTER — Encounter: Attending: Cardiology | Admitting: *Deleted

## 2023-12-09 ENCOUNTER — Encounter: Payer: Self-pay | Admitting: Radiology

## 2023-12-09 ENCOUNTER — Encounter

## 2023-12-09 DIAGNOSIS — Z952 Presence of prosthetic heart valve: Secondary | ICD-10-CM | POA: Insufficient documentation

## 2023-12-09 NOTE — Progress Notes (Signed)
 Incomplete Session Note  Patient Details  Name: Wanda Lang MRN: [id number] Date of Birth: 05/28/1950 Referring Provider:    Montie GORMAN Space did not complete her rehab session.  [name] came for her cardiac rehab orientation appointment today. She presented with bigeminy and trigeminy rhythms. According to records we were able to see, there was no history of this rhythm. Patient was advised to call Dr. Ammon (referring MD) to determine if she needed to be seen before starting cardiac rehab. Patient was given a copy of rhythm strips and a copy was also faxed to the office of Dr. Ammon.

## 2023-12-10 DIAGNOSIS — R002 Palpitations: Secondary | ICD-10-CM | POA: Diagnosis not present

## 2023-12-11 ENCOUNTER — Telehealth: Payer: Self-pay | Admitting: *Deleted

## 2023-12-11 ENCOUNTER — Encounter: Payer: Self-pay | Admitting: *Deleted

## 2023-12-11 DIAGNOSIS — Z952 Presence of prosthetic heart valve: Secondary | ICD-10-CM

## 2023-12-11 NOTE — Telephone Encounter (Signed)
 Called Wanda Lang to follow up from her incomplete orientation due to her presenting with bigeminy and trigeminy during initial evaluation. Message was left that statede Dr. Geno did respond back to our message and would like her to hold off of cardiac rehab for now and they will be ordering a 72 hour monitor to assess her current rhythm patterns.

## 2023-12-13 DIAGNOSIS — S29012A Strain of muscle and tendon of back wall of thorax, initial encounter: Secondary | ICD-10-CM | POA: Diagnosis not present

## 2023-12-13 DIAGNOSIS — M25512 Pain in left shoulder: Secondary | ICD-10-CM | POA: Diagnosis not present

## 2023-12-16 DIAGNOSIS — M9901 Segmental and somatic dysfunction of cervical region: Secondary | ICD-10-CM | POA: Diagnosis not present

## 2023-12-16 DIAGNOSIS — Z48812 Encounter for surgical aftercare following surgery on the circulatory system: Secondary | ICD-10-CM | POA: Diagnosis not present

## 2023-12-16 DIAGNOSIS — M9902 Segmental and somatic dysfunction of thoracic region: Secondary | ICD-10-CM | POA: Diagnosis not present

## 2023-12-16 DIAGNOSIS — M9904 Segmental and somatic dysfunction of sacral region: Secondary | ICD-10-CM | POA: Diagnosis not present

## 2023-12-16 DIAGNOSIS — M5030 Other cervical disc degeneration, unspecified cervical region: Secondary | ICD-10-CM | POA: Diagnosis not present

## 2023-12-16 DIAGNOSIS — Z952 Presence of prosthetic heart valve: Secondary | ICD-10-CM | POA: Diagnosis not present

## 2023-12-16 DIAGNOSIS — M9903 Segmental and somatic dysfunction of lumbar region: Secondary | ICD-10-CM | POA: Diagnosis not present

## 2023-12-16 DIAGNOSIS — R918 Other nonspecific abnormal finding of lung field: Secondary | ICD-10-CM | POA: Diagnosis not present

## 2023-12-16 DIAGNOSIS — R9431 Abnormal electrocardiogram [ECG] [EKG]: Secondary | ICD-10-CM | POA: Diagnosis not present

## 2023-12-16 DIAGNOSIS — M9905 Segmental and somatic dysfunction of pelvic region: Secondary | ICD-10-CM | POA: Diagnosis not present

## 2023-12-20 ENCOUNTER — Telehealth: Payer: Self-pay

## 2023-12-20 NOTE — Telephone Encounter (Signed)
 We received a Surgical Clearance via fax today from [facility] [place].  I sent a copy to Leron Glance, FNP-C's folder on the S drive and hand-delivered a copy to Laurier Ellen, CMA.

## 2023-12-23 ENCOUNTER — Telehealth: Payer: Self-pay

## 2023-12-23 DIAGNOSIS — M5030 Other cervical disc degeneration, unspecified cervical region: Secondary | ICD-10-CM | POA: Diagnosis not present

## 2023-12-23 DIAGNOSIS — M9903 Segmental and somatic dysfunction of lumbar region: Secondary | ICD-10-CM | POA: Diagnosis not present

## 2023-12-23 DIAGNOSIS — M9904 Segmental and somatic dysfunction of sacral region: Secondary | ICD-10-CM | POA: Diagnosis not present

## 2023-12-23 DIAGNOSIS — M9902 Segmental and somatic dysfunction of thoracic region: Secondary | ICD-10-CM | POA: Diagnosis not present

## 2023-12-23 DIAGNOSIS — K754 Autoimmune hepatitis: Secondary | ICD-10-CM | POA: Diagnosis not present

## 2023-12-23 DIAGNOSIS — M9905 Segmental and somatic dysfunction of pelvic region: Secondary | ICD-10-CM | POA: Diagnosis not present

## 2023-12-23 DIAGNOSIS — M9901 Segmental and somatic dysfunction of cervical region: Secondary | ICD-10-CM | POA: Diagnosis not present

## 2023-12-23 NOTE — Telephone Encounter (Signed)
 Pre op forms from [facility] placed in provider to be signed folder last seen in office 12-05-23

## 2023-12-24 DIAGNOSIS — Z952 Presence of prosthetic heart valve: Secondary | ICD-10-CM | POA: Diagnosis not present

## 2023-12-24 DIAGNOSIS — G4733 Obstructive sleep apnea (adult) (pediatric): Secondary | ICD-10-CM | POA: Diagnosis not present

## 2023-12-24 DIAGNOSIS — I150 Renovascular hypertension: Secondary | ICD-10-CM | POA: Diagnosis not present

## 2023-12-24 NOTE — Telephone Encounter (Signed)
 Form faxed to [phone]

## 2023-12-26 DIAGNOSIS — R7989 Other specified abnormal findings of blood chemistry: Secondary | ICD-10-CM | POA: Diagnosis not present

## 2023-12-26 DIAGNOSIS — K754 Autoimmune hepatitis: Secondary | ICD-10-CM | POA: Diagnosis not present

## 2023-12-26 DIAGNOSIS — M0589 Other rheumatoid arthritis with rheumatoid factor of multiple sites: Secondary | ICD-10-CM | POA: Diagnosis not present

## 2023-12-26 DIAGNOSIS — K760 Fatty (change of) liver, not elsewhere classified: Secondary | ICD-10-CM | POA: Diagnosis not present

## 2023-12-26 DIAGNOSIS — Z79899 Other long term (current) drug therapy: Secondary | ICD-10-CM | POA: Diagnosis not present

## 2023-12-26 DIAGNOSIS — M150 Primary generalized (osteo)arthritis: Secondary | ICD-10-CM | POA: Diagnosis not present

## 2023-12-30 DIAGNOSIS — M9903 Segmental and somatic dysfunction of lumbar region: Secondary | ICD-10-CM | POA: Diagnosis not present

## 2023-12-30 DIAGNOSIS — M9905 Segmental and somatic dysfunction of pelvic region: Secondary | ICD-10-CM | POA: Diagnosis not present

## 2023-12-30 DIAGNOSIS — M5030 Other cervical disc degeneration, unspecified cervical region: Secondary | ICD-10-CM | POA: Diagnosis not present

## 2023-12-30 DIAGNOSIS — M9901 Segmental and somatic dysfunction of cervical region: Secondary | ICD-10-CM | POA: Diagnosis not present

## 2023-12-30 DIAGNOSIS — R002 Palpitations: Secondary | ICD-10-CM | POA: Diagnosis not present

## 2023-12-30 DIAGNOSIS — M9902 Segmental and somatic dysfunction of thoracic region: Secondary | ICD-10-CM | POA: Diagnosis not present

## 2023-12-30 DIAGNOSIS — M9904 Segmental and somatic dysfunction of sacral region: Secondary | ICD-10-CM | POA: Diagnosis not present

## 2023-12-31 ENCOUNTER — Ambulatory Visit: Admitting: *Deleted

## 2023-12-31 VITALS — Ht 64.0 in | Wt 232.0 lb

## 2023-12-31 DIAGNOSIS — Z0000 Encounter for general adult medical examination without abnormal findings: Secondary | ICD-10-CM

## 2023-12-31 DIAGNOSIS — R002 Palpitations: Secondary | ICD-10-CM | POA: Diagnosis not present

## 2023-12-31 DIAGNOSIS — I493 Ventricular premature depolarization: Secondary | ICD-10-CM | POA: Diagnosis not present

## 2023-12-31 DIAGNOSIS — Z952 Presence of prosthetic heart valve: Secondary | ICD-10-CM | POA: Diagnosis not present

## 2023-12-31 DIAGNOSIS — I10 Essential (primary) hypertension: Secondary | ICD-10-CM | POA: Diagnosis not present

## 2023-12-31 NOTE — Progress Notes (Cosign Needed Addendum)
 Chief Complaint  Patient presents with   Medicare Wellness    I connected with  Wanda Lang on 12/31/23 by a audio enabled telemedicine application and verified that I am speaking with the correct person using two identifiers.  Patient Location: Home  Provider Location: Home Office  Persons Participating in Visit: Patient.  I discussed the limitations of evaluation and management by telemedicine. The patient expressed understanding and agreed to proceed.   Vital Signs: Because this visit was a virtual/telehealth visit, some criteria may be missing or patient reported. Any vitals not documented were not able to be obtained and vitals that have been documented are patient reported.     Subjective:   Wanda Lang is a 73 y.o. female who presents for a Medicare Annual Wellness Visit.  Allergies (verified) Penicillins, Amoxicillin, Latex, Misc. sulfonamide containing compounds, Penicillin g, Rosanil cleanser [sulfacetamide sodium-sulfur], Sulfonamide derivatives, Wound dressing adhesive, and Ylang-ylang [cananga oil (ylang-ylang)]   History: Past Medical History:  Diagnosis Date   Allergy    Arthritis    Coronary artery disease    Fatty liver    GERD (gastroesophageal reflux disease)    Heart murmur    Hypertension    Hx   LIVER FUNCTION TESTS, ABNORMAL, HX OF 09/08/2007   Qualifier: Diagnosis of  By: Nelson-Smith CMA (AAMA), Dottie     Neuromuscular disorder [facility])    Essential Tremor   Overactive bladder    Past Surgical History:  Procedure Laterality Date   ABDOMINAL HYSTERECTOMY     CARDIAC VALVE REPLACEMENT  11/13/2023   COLONOSCOPY WITH PROPOFOL  N/A 09/04/2018   Procedure: COLONOSCOPY WITH PROPOFOL ;  Surgeon: Janalyn Keene NOVAK, MD;  Location: [facility] ENDOSCOPY;  Service: Endoscopy;  Laterality: N/A;   ESOPHAGOGASTRODUODENOSCOPY (EGD) WITH PROPOFOL  N/A 09/04/2018   Procedure: ESOPHAGOGASTRODUODENOSCOPY (EGD) WITH PROPOFOL ;  Surgeon: Janalyn Keene NOVAK, MD;   Location: [facility] ENDOSCOPY;  Service: Endoscopy;  Laterality: N/A;   LAPAROSCOPY     for endometriosis x2   MOUTH SURGERY  04/2023   tooth extraction, gum graft   RIGHT/LEFT HEART CATH AND CORONARY ANGIOGRAPHY Bilateral 09/19/2023   Procedure: RIGHT/LEFT HEART CATH AND CORONARY ANGIOGRAPHY;  Surgeon: Ammon Blunt, MD;  Location: [facility] INVASIVE CV LAB;  Service: Cardiovascular;  Laterality: Bilateral;   TONSILLECTOMY     TOTAL HIP ARTHROPLASTY Right 05/15/2022   Procedure: RIGHT TOTAL HIP ARTHROPLASTY ANTERIOR APPROACH;  Surgeon: Vernetta Lonni GRADE, MD;  Location: [facility] OR;  Service: Orthopedics;  Laterality: Right;   TOTAL KNEE ARTHROPLASTY Left 07/18/2021   Procedure: TOTAL KNEE ARTHROPLASTY;  Surgeon: Ernie Cough, MD;  Location: [facility] ORS;  Service: Orthopedics;  Laterality: Left;   TURBINATE REDUCTION     Family History  Problem Relation Age of Onset   Alcohol abuse Mother    Arthritis Mother    Hyperlipidemia Mother    Heart disease Mother    Stroke Mother    Hypertension Mother    Depression Mother    Anxiety disorder Mother    Arthritis Father    Lung cancer Paternal Grandfather    Kidney disease Paternal Grandfather    Social History   Occupational History   Not on file  Tobacco Use   Smoking status: Never    Passive exposure: Never   Smokeless tobacco: Never  Vaping Use   Vaping status: Never Used  Substance and Sexual Activity   Alcohol use: Not Currently    Alcohol/week: 1.0 standard drink of alcohol    Types: 1 Glasses of wine  per week   Drug use: No   Sexual activity: Not Currently   Tobacco Counseling Counseling given: Not Answered  SDOH Screenings   Food Insecurity: No Food Insecurity (12/31/2023)  Housing: Low Risk  (12/31/2023)  Transportation Needs: No Transportation Needs (12/31/2023)  Utilities: Not At Risk (12/31/2023)  Alcohol Screen: Low Risk  (12/31/2023)  Depression (PHQ2-9): Low Risk  (12/31/2023)  Recent Concern: Depression  (PHQ2-9) - Medium Risk (10/10/2023)  Financial Resource Strain: Low Risk  (12/31/2023)  Physical Activity: Inactive (12/31/2023)  Social Connections: Moderately Integrated (12/31/2023)  Stress: No Stress Concern Present (12/31/2023)  Tobacco Use: Low Risk  (12/31/2023)  Health Literacy: Adequate Health Literacy (12/31/2023)   See flowsheets for full screening details  Depression Screen PHQ 2 & 9 Depression Scale- Over the past 2 weeks, how often have you been bothered by any of the following problems? Little interest or pleasure in doing things: 0 Feeling down, depressed, or hopeless (PHQ Adolescent also includes...irritable): 0 PHQ-2 Total Score: 0 Trouble falling or staying asleep, or sleeping too much: 1 Feeling tired or having little energy: 1 Poor appetite or overeating (PHQ Adolescent also includes...weight loss): 0 Feeling bad about yourself - or that you are a failure or have let yourself or your family down: 0 Trouble concentrating on things, such as reading the newspaper or watching television (PHQ Adolescent also includes...like school work): 0 Moving or speaking so slowly that other people could have noticed. Or the opposite - being so fidgety or restless that you have been moving around a lot more than usual: 0 Thoughts that you would be better off dead, or of hurting yourself in some way: 0 PHQ-9 Total Score: 2 If you checked off any problems, how difficult have these problems made it for you to do your work, take care of things at home, or get along with other people?: Somewhat difficult     Goals Addressed             This Visit's Progress    Patient Stated       Wants to lose weight       Visit info / Clinical Intake: Medicare Wellness Visit Type:: Subsequent Annual Wellness Visit Persons participating in visit:: patient Medicare Wellness Visit Mode:: Telephone If telephone:: video declined Because this visit was a virtual/telehealth visit:: pt reported  vitals If Telephone or Video please confirm:: I connected with the patient using audio enabled telemedicine application and verified that I am speaking with the correct person using two identifiers; I discussed the limitations of evaluation and management by telemedicine; The patient expressed understanding and agreed to proceed Patient Location:: Home Provider Location:: Office/Home Interpreter Needed?: No Pre-visit prep was completed: yes AWV questionnaire completed by patient prior to visit?: no Living arrangements:: (!) lives alone Patient's Overall Health Status Rating: (!) fair Typical amount of pain: some Does pain affect daily life?: (!) yes Are you currently prescribed opioids?: (!) yes  Dietary Habits and Nutritional Risks How many meals a day?: 2 Eats fruit and vegetables daily?: yes Most meals are obtained by: preparing own meals In the last 2 weeks, have you had any of the following?: none Diabetic:: no  Functional Status Activities of Daily Living (to include ambulation/medication): Independent Ambulation: Independent with device- listed below Home Assistive Devices/Equipment: Cane Medication Administration: Independent Home Management: Needs assistance (comment) Manage your own finances?: yes Primary transportation is: driving Concerns about vision?: no *vision screening is required for WTM* Concerns about hearing?: no  Fall Screening Falls  in the past year?: 0 Number of falls in past year: 0 Was there an injury with Fall?: 0 Fall Risk Category Calculator: 0 Patient Fall Risk Level: Low Fall Risk  Fall Risk Patient at Risk for Falls Due to: No Fall Risks Fall risk Follow up: Falls evaluation completed; Falls prevention discussed  Home and Transportation Safety: All rugs have non-skid backing?: (!) no All stairs or steps have railings?: yes Grab bars in the bathtub or shower?: yes Have non-skid surface in bathtub or shower?: (!) no Good home lighting?:  yes Regular seat belt use?: yes Hospital stays in the last year:: (!) yes How many hospital stays:: 1 Reason: heart valve replacement  Cognitive Assessment Difficulty concentrating, remembering, or making decisions? : no Will 6CIT or Mini Cog be Completed: yes What year is it?: 0 points What month is it?: 0 points Give patient an address phrase to remember (5 components): [address] [facility] About what time is it?: 0 points Count backwards from 20 to 1: 0 points Say the months of the year in reverse: 0 points Repeat the address phrase from earlier: 0 points 6 CIT Score: 0 points  Advance Directives (For Healthcare) Does Patient Have a Medical Advance Directive?: Yes Does patient want to make changes to medical advance directive?: No - Patient declined Type of Advance Directive: Healthcare Power of [place]; Living will Copy of Healthcare Power of Attorney in Chart?: No - copy requested Copy of Living Will in Chart?: No - copy requested  Reviewed/Updated  Reviewed/Updated: Reviewed All (Medical, Surgical, Family, Medications, Allergies, Care Teams, Patient Goals)        Objective:    Today's Vitals   12/31/23 0811  Weight: 232 lb (105.2 kg)  Height: 5' 4 (1.626 m)   Body mass index is 39.82 kg/m.  Current Medications (verified) Outpatient Encounter Medications as of 12/31/2023  Medication Sig   Abatacept  (ORENCIA  IV) Inject 1 application  into the vein every 30 (thirty) days.   Ascorbic Acid (VITAMIN C PO) Take 500 mg by mouth daily.   aspirin  EC 81 MG tablet Take 81 mg by mouth daily.   b complex vitamins capsule Take 1 capsule by mouth daily. With vit C   CALCIUM  PO Take 1,200 mg by mouth daily.   celecoxib  (CELEBREX ) 200 MG capsule Take 200 mg by mouth daily.   cycloSPORINE  (RESTASIS ) 0.05 % ophthalmic emulsion Place 1 drop into both eyes 2 (two) times daily.   Estradiol (VAGIFEM) 10 MCG TABS vaginal tablet Take 10 mcg by mouth 2 (two) times a week.    fexofenadine (ALLEGRA) 180 MG tablet Take 180 mg by mouth daily. (Patient taking differently: Take 180 mg by mouth daily as needed.)   Iodine  Strong, Lugols, (IODINE  STRONG PO) Take 1 Dose by mouth daily. 1950 mcg. 3 drops per day.   lidocaine  (LIDODERM ) 5 % Place 2-3 patches onto the skin daily.   MAGNESIUM  PO Take 1,250 mg by mouth 2 (two) times a week. Glycinate   methocarbamol  (ROBAXIN ) 750 MG tablet Take 750 mg by mouth at bedtime. (Patient taking differently: Take 750 mg by mouth 3 times/day as needed-between meals & bedtime.)   Multiple Vitamin (MULTIVITAMIN WITH MINERALS) TABS tablet Take 1 tablet by mouth daily.   Omega-3 Fatty Acids (FISH OIL PO) Take 750 mg by mouth daily.   OVER THE COUNTER MEDICATION Apply 1 Application topically daily at 12 noon. frankincense   oxyCODONE  (OXY IR/ROXICODONE ) 5 MG immediate release tablet Take 5 mg  by mouth at bedtime.   polyethylene glycol powder (GLYCOLAX /MIRALAX ) 17 GM/SCOOP powder Take 17 g by mouth daily as needed. Dissolve 1 capful (17g) in 4-8 ounces of liquid and take by mouth daily.   trolamine salicylate (BLUE-EMU HEMP) 10 % cream Apply 1 Application topically as needed for muscle pain.   VITAMIN D -VITAMIN K PO Take 2 mLs by mouth daily.   acetaminophen  (TYLENOL ) 500 MG tablet Take 1,000 mg by mouth 2 (two) times daily. (Patient not taking: Reported on 12/31/2023)   Glucosamine HCl 1500 MG TABS Take 1,500 mg by mouth daily. (Patient not taking: Reported on 12/31/2023)   prednisoLONE acetate (PRED FORTE) 1 % ophthalmic suspension 3 (three) times daily (Patient not taking: Reported on 12/31/2023)   senna-docusate (SENOKOT-S) 8.6-50 MG tablet Take 1 tablet by mouth. (Patient not taking: Reported on 12/31/2023)   [DISCONTINUED] cholecalciferol (VITAMIN D3) 25 MCG (1000 UNIT) tablet Take 4,000 Units by mouth daily. (Patient not taking: Reported on 12/31/2023)   No facility-administered encounter medications on file as of 12/31/2023.    Hearing/Vision screen Hearing Screening - Comments:: No issues Vision Screening - Comments:: Glasses, [facility], up to date, in the process of changing Immunizations and Health Maintenance Health Maintenance  Topic Date Due   DTaP/Tdap/Td (1 - Tdap) Never done   COVID-19 Vaccine (7 - 2025-26 season) 10/07/2023   Influenza Vaccine  05/05/2024 (Originally 09/06/2023)   Medicare Annual Wellness (AWV)  12/30/2024   Mammogram  03/07/2025   Colonoscopy  09/03/2028   Pneumococcal Vaccine: 50+ Years  Completed   Bone Density Scan  Completed   Hepatitis C Screening  Completed   Zoster Vaccines- Shingrix  Completed   Meningococcal B Vaccine  Aged Out   Hepatitis B Vaccines 19-59 Average Risk  Discontinued        Assessment/Plan:  This is a routine wellness examination for Wanda Lang.  Patient Care Team: Gretel App, NP as PCP - General (Nurse Practitioner) Isaiah Scrivener, MD as Consulting Physician (Pulmonary Disease) Unk Corinn Skiff, MD as Consulting Physician (Gastroenterology) Ammon Blunt, MD as Consulting Physician (Cardiology)  I have personally reviewed and noted the following in the patient's chart:   Medical and social history Use of alcohol, tobacco or illicit drugs  Current medications and supplements including opioid prescriptions. Functional ability and status Nutritional status Physical activity Advanced directives List of other physicians Hospitalizations, surgeries, and ER visits in previous 12 months Vitals Screenings to include cognitive, depression, and falls Referrals and appointments  No orders of the defined types were placed in this encounter.  In addition, I have reviewed and discussed with patient certain preventive protocols, quality metrics, and best practice recommendations. A written personalized care plan for preventive services as well as general preventive health recommendations were provided to patient.   Angeline Fredericks,  LPN   [id number]   Return in 1 year (on 12/30/2024).  After Visit Summary: (MyChart) Due to this being a telephonic visit, the after visit summary with patients personalized plan was offered to patient via MyChart   Nurse Notes: Discussed the need to update tetanus (Tdap) vaccine.

## 2023-12-31 NOTE — Patient Instructions (Signed)
 Wanda Lang,  Thank you for taking the time for your Medicare Wellness Visit. I appreciate your continued commitment to your health goals. Please review the care plan we discussed, and feel free to reach out if I can assist you further.  Please note that Annual Wellness Visits do not include a physical exam. Some assessments may be limited, especially if the visit was conducted virtually. If needed, we may recommend an in-person follow-up with your provider.  Ongoing Care Seeing your primary care provider every 3 to 6 months helps us  monitor your health and provide consistent, personalized care.  Remember to update your tetanus (Tdap) vaccine.  Referrals If a referral was made during today's visit and you haven't received any updates within two weeks, please contact the referred provider directly to check on the status.  Recommended Screenings:  Health Maintenance  Topic Date Due   DTaP/Tdap/Td vaccine (1 - Tdap) Never done   Breast Cancer Screening  03/07/2024   COVID-19 Vaccine (8 - Pfizer risk 2025-26 season) 04/28/2024   Medicare Annual Wellness Visit  12/30/2024   Colon Cancer Screening  09/03/2028   Pneumococcal Vaccine for age over 76  Completed   Flu Shot  Completed   Osteoporosis screening with Bone Density Scan  Completed   Hepatitis C Screening  Completed   Zoster (Shingles) Vaccine  Completed   Meningitis B Vaccine  Aged Out   Hepatitis B Vaccine  Discontinued       12/31/2023    8:24 AM  Advanced Directives  Does Patient Have a Medical Advance Directive? Yes  Type of Estate Agent of [place];Living will  Does patient want to make changes to medical advance directive? No - Patient declined  Copy of Healthcare Power of Attorney in Chart? No - copy requested    Vision: Annual vision screenings are recommended for early detection of glaucoma, cataracts, and diabetic retinopathy. These exams can also reveal signs of chronic conditions such as  diabetes and high blood pressure.  Dental: Annual dental screenings help detect early signs of oral cancer, gum disease, and other conditions linked to overall health, including heart disease and diabetes.  Please see the attached documents for additional preventive care recommendations.   Managing Pain Without Opioids Opioids are strong medicines used to treat moderate to severe pain. For some people, especially those who have long-term (chronic) pain, opioids may not be the best choice for pain management due to: Side effects like nausea, constipation, and sleepiness. The risk of addiction (opioid use disorder). The longer you take opioids, the greater your risk of addiction. Pain that lasts for more than 3 months is called chronic pain. Managing chronic pain usually requires more than one approach and is often provided by a team of health care providers working together (multidisciplinary approach). Pain management may be done at a pain management center or pain clinic. How to manage pain without the use of opioids Use non-opioid medicines Non-opioid medicines for pain may include: Over-the-counter or prescription non-steroidal anti-inflammatory drugs (NSAIDs). These may be the first medicines used for pain. They work well for muscle and bone pain, and they reduce swelling. Acetaminophen . This over-the-counter medicine may work well for milder pain but not swelling. Antidepressants. These may be used to treat chronic pain. A certain type of antidepressant (tricyclics) is often used. These medicines are given in lower doses for pain than when used for depression. Anticonvulsants. These are usually used to treat seizures but may also reduce nerve (neuropathic) pain. Muscle  relaxants. These relieve pain caused by sudden muscle tightening (spasms). You may also use a pain medicine that is applied to the skin as a patch, cream, or gel (topical analgesic), such as a numbing medicine. These may cause  fewer side effects than medicines taken by mouth. Do certain therapies as directed Some therapies can help with pain management. They include: Physical therapy. You will do exercises to gain strength and flexibility. A physical therapist may teach you exercises to move and stretch parts of your body that are weak, stiff, or painful. You can learn these exercises at physical therapy visits and practice them at home. Physical therapy may also involve: Massage. Heat wraps or applying heat or cold to affected areas. Electrical signals that interrupt pain signals (transcutaneous electrical nerve stimulation, TENS). Weak lasers that reduce pain and swelling (low-level laser therapy). Signals from your body that help you learn to regulate pain (biofeedback). Occupational therapy. This helps you to learn ways to function at home and work with less pain. Recreational therapy. This involves trying new activities or hobbies, such as a physical activity or drawing. Mental health therapy, including: Cognitive behavioral therapy (CBT). This helps you learn coping skills for dealing with pain. Acceptance and commitment therapy (ACT) to change the way you think and react to pain. Relaxation therapies, including muscle relaxation exercises and mindfulness-based stress reduction. Pain management counseling. This may be individual, family, or group counseling.  Receive medical treatments Medical treatments for pain management include: Nerve block injections. These may include a pain blocker and anti-inflammatory medicines. You may have injections: Near the spine to relieve chronic back or neck pain. Into joints to relieve back or joint pain. Into nerve areas that supply a painful area to relieve body pain. Into muscles (trigger point injections) to relieve some painful muscle conditions. A medical device placed near your spine to help block pain signals and relieve nerve pain or chronic back pain (spinal cord  stimulation device). Acupuncture. Follow these instructions at home Medicines Take over-the-counter and prescription medicines only as told by your health care provider. If you are taking pain medicine, ask your health care providers about possible side effects to watch out for. Do not drive or use heavy machinery while taking prescription opioid pain medicine. Lifestyle  Do not use drugs or alcohol to reduce pain. If you drink alcohol, limit how much you have to: 0-1 drink a day for women who are not pregnant. 0-2 drinks a day for men. Know how much alcohol is in a drink. In the [place] one drink equals one 12 oz bottle of beer (355 mL), one 5 oz glass of wine (148 mL), or one 1 oz glass of hard liquor (44 mL). Do not use any products that contain nicotine or tobacco. These products include cigarettes, chewing tobacco, and vaping devices, such as e-cigarettes. If you need help quitting, ask your health care provider. Eat a healthy diet and maintain a healthy weight. Poor diet and excess weight may make pain worse. Eat foods that are high in fiber. These include fresh fruits and vegetables, whole grains, and beans. Limit foods that are high in fat and processed sugars, such as fried and sweet foods. Exercise regularly. Exercise lowers stress and may help relieve pain. Ask your health care provider what activities and exercises are safe for you. If your health care provider approves, join an exercise class that combines movement and stress reduction. Examples include yoga and tai chi. Get enough sleep. Lack of sleep may  make pain worse. Lower stress as much as possible. Practice stress reduction techniques as told by your therapist. General instructions Work with all your pain management providers to find the treatments that work best for you. You are an important member of your pain management team. There are many things you can do to reduce pain on your own. Consider joining an online or  in-person support group for people who have chronic pain. Keep all follow-up visits. This is important. Where to find more information You can find more information about managing pain without opioids from: American Academy of Pain Medicine: painmed.org Institute for Chronic Pain: instituteforchronicpain.org American Chronic Pain Association: theacpa.org Contact a health care provider if: You have side effects from pain medicine. Your pain gets worse or does not get better with treatments or home therapy. You are struggling with anxiety or depression. Summary Many types of pain can be managed without opioids. Chronic pain may respond better to pain management without opioids. Pain is best managed when you and a team of health care providers work together. Pain management without opioids may include non-opioid medicines, medical treatments, physical therapy, mental health therapy, and lifestyle changes. Tell your health care providers if your pain gets worse or is not being managed well enough. This information is not intended to replace advice given to you by your health care provider. Make sure you discuss any questions you have with your health care provider. Document Revised: 05/04/2020 Document Reviewed: 05/04/2020 Elsevier Patient Education  2024 [employer].

## 2024-01-06 DIAGNOSIS — M9903 Segmental and somatic dysfunction of lumbar region: Secondary | ICD-10-CM | POA: Diagnosis not present

## 2024-01-06 DIAGNOSIS — M9905 Segmental and somatic dysfunction of pelvic region: Secondary | ICD-10-CM | POA: Diagnosis not present

## 2024-01-06 DIAGNOSIS — M9902 Segmental and somatic dysfunction of thoracic region: Secondary | ICD-10-CM | POA: Diagnosis not present

## 2024-01-06 DIAGNOSIS — M5030 Other cervical disc degeneration, unspecified cervical region: Secondary | ICD-10-CM | POA: Diagnosis not present

## 2024-01-06 DIAGNOSIS — M9901 Segmental and somatic dysfunction of cervical region: Secondary | ICD-10-CM | POA: Diagnosis not present

## 2024-01-06 DIAGNOSIS — M9904 Segmental and somatic dysfunction of sacral region: Secondary | ICD-10-CM | POA: Diagnosis not present

## 2024-01-08 ENCOUNTER — Other Ambulatory Visit (HOSPITAL_BASED_OUTPATIENT_CLINIC_OR_DEPARTMENT_OTHER): Payer: Self-pay | Admitting: Orthopaedic Surgery

## 2024-01-08 DIAGNOSIS — M19011 Primary osteoarthritis, right shoulder: Secondary | ICD-10-CM

## 2024-01-16 ENCOUNTER — Telehealth: Payer: Self-pay

## 2024-01-16 NOTE — Telephone Encounter (Signed)
 Patient states she is ready for Cardiac rehab. She states shoulder surgery in January and is wondering if it would be ok to start rehab. Informed her that she would need a new referral from doctor Paraschos. Patient verbalizes understanding.

## 2024-01-23 ENCOUNTER — Other Ambulatory Visit (HOSPITAL_BASED_OUTPATIENT_CLINIC_OR_DEPARTMENT_OTHER): Payer: Self-pay | Admitting: Orthopaedic Surgery

## 2024-01-23 ENCOUNTER — Telehealth: Payer: Self-pay | Admitting: Orthopaedic Surgery

## 2024-01-23 MED ORDER — METHYLPREDNISOLONE 4 MG PO TBPK: ORAL_TABLET | ORAL | 0 refills | Status: DC

## 2024-01-23 NOTE — Telephone Encounter (Signed)
 Pt called requesting for prednisone  for left shoulder pains. She stated she over worked the good shoulder waiting for right shoulder surgery in Jan. Pharmacy is [facility]. Pt number is [phone].

## 2024-02-10 ENCOUNTER — Other Ambulatory Visit: Payer: Self-pay

## 2024-02-10 ENCOUNTER — Encounter (HOSPITAL_BASED_OUTPATIENT_CLINIC_OR_DEPARTMENT_OTHER): Payer: Self-pay | Admitting: Orthopaedic Surgery

## 2024-02-10 NOTE — Progress Notes (Signed)
" °   02/10/24 1250  Pre-op Phone Call  Surgery Date Verified 02/17/24  Arrival Time Verified 0615  Surgery Location Verified [facility] [place]  Medical History Reviewed Yes  Is the patient taking a GLP-1 receptor agonist? No  Does the patient have diabetes? No diagnosis of diabetes  Do you have a history of heart problems? Yes  Cardiologist Name dr.Paraschos  Have you ever had tests on your heart? Yes  What cardiac tests were performed? Cardiac Cath;Echo;Labs;EKG  Results viewable: [facility] Tab;Care Everywhere  Does patient have other implanted devices? No  Patient Teaching Enhanced Recovery;Pre / Post Procedure  Patient educated about smoking cessation 24 hours prior to surgery. N/A Non-Smoker  Patient verbalizes understanding of bowel prep? N/A  THA/TKA patients only:  By your surgery date, will you have been taking narcotics for 90 days or greater? No  Med Rec Completed Yes  Recent  Lab Work, EKG, CXR? No  NPO (Including gum & candy) After midnight  Allowed clear liquids Water ;Gatorade  (diabetics please choose diet or no sugar options)  Patient instructed to stop clear liquids including Carb loading drink at: 0500  Stop Solids, Milk, Candy, and Gum STARTING AT MIDNIGHT  Did patient view EMMI videos? No  Responsible adult to drive and be with you for 24 hours? Yes  Name & Phone Number for Ride/Caregiver sister sharron  No Jewelry, money, nail polish or make-up.  No lotions, powders, perfumes. No shaving  48 hrs. prior to surgery. Yes  Contacts, Dentures & Glasses Will Have to be Removed Before OR. Yes  Please bring your ID and Insurance Card the morning of your surgery. (Surgery Centers Only) Yes  Bring any papers or x-rays with you that your surgeon gave you. Yes  Instructed to contact the location of procedure/ provider if they or anyone in their household develops symptoms or tests positive for COVID-19, has close contact with someone who tests positive for COVID, or has known exposure to any  contagious illness. Yes  Call this number the morning of surgery  with any problems that may cancel your surgery. [phone]  Covid-19 Assessment  Have you had a positive COVID-19 test within the previous 90 days? No  COVID Testing Guidance Proceed with the additional questions.  Have you been unmasked and in close contact with anyone with COVID-19 or COVID-19 symptoms within the past 10 days? No  Do you or anyone in your household currently have any COVID-19 symptoms? No    "

## 2024-02-13 ENCOUNTER — Encounter (HOSPITAL_BASED_OUTPATIENT_CLINIC_OR_DEPARTMENT_OTHER)
Admission: RE | Admit: 2024-02-13 | Discharge: 2024-02-13 | Disposition: A | Discharge status: Home / Self Care | Source: Ambulatory Visit | Attending: Orthopaedic Surgery | Admitting: Orthopaedic Surgery

## 2024-02-13 DIAGNOSIS — Z01812 Encounter for preprocedural laboratory examination: Secondary | ICD-10-CM | POA: Insufficient documentation

## 2024-02-13 LAB — SURGICAL PCR SCREEN
MRSA, PCR: NEGATIVE
Staphylococcus aureus: POSITIVE — AB

## 2024-02-14 NOTE — Care Plan (Signed)
 Ortho Bundle Case Management Note  Patient Details  Name: Wanda Lang MRN: [id number] Date of Birth: 27-Nov-1950  Spoke with patient prior to her upcoming Right reverse shoulder arthroplasty with Dr. Genelle at [facility] on 02/17/24. She is agreeable to case management. Her sister will be coming to stay with her after her d/c home. She has her OPPT already scheduled with [facility] on 02/19/24. Reviewed post op care instructions. Will follow for needs.                  DME Arranged:   (No DME needed; will be provided sling and ice machine) DME Agency:     HH Arranged:    HH Agency:   (No HHPT needed; already scheduled for OPPT)  Additional Comments: Please contact me with any questions of if this plan should need to change.  Tylene Ned, RN, BSN, [employer]  [phone] 02/14/2024, 3:57 PM

## 2024-02-17 ENCOUNTER — Ambulatory Visit (HOSPITAL_COMMUNITY)

## 2024-02-17 ENCOUNTER — Ambulatory Visit: Payer: Self-pay | Admitting: Orthopaedic Surgery

## 2024-02-17 ENCOUNTER — Ambulatory Visit (HOSPITAL_BASED_OUTPATIENT_CLINIC_OR_DEPARTMENT_OTHER): Admitting: Anesthesiology

## 2024-02-17 ENCOUNTER — Other Ambulatory Visit: Payer: Self-pay

## 2024-02-17 ENCOUNTER — Encounter (HOSPITAL_BASED_OUTPATIENT_CLINIC_OR_DEPARTMENT_OTHER): Payer: Self-pay | Admitting: Orthopaedic Surgery

## 2024-02-17 ENCOUNTER — Ambulatory Visit (HOSPITAL_BASED_OUTPATIENT_CLINIC_OR_DEPARTMENT_OTHER)
Admission: RE | Admit: 2024-02-17 | Discharge: 2024-02-17 | Disposition: A | Attending: Orthopaedic Surgery | Admitting: Orthopaedic Surgery

## 2024-02-17 ENCOUNTER — Encounter (HOSPITAL_BASED_OUTPATIENT_CLINIC_OR_DEPARTMENT_OTHER)
Admission: RE | Disposition: A | Discharge status: Home / Self Care | Payer: Self-pay | Source: Home / Self Care | Attending: Orthopaedic Surgery

## 2024-02-17 DIAGNOSIS — Z96641 Presence of right artificial hip joint: Secondary | ICD-10-CM | POA: Diagnosis not present

## 2024-02-17 DIAGNOSIS — Z79899 Other long term (current) drug therapy: Secondary | ICD-10-CM | POA: Insufficient documentation

## 2024-02-17 DIAGNOSIS — K219 Gastro-esophageal reflux disease without esophagitis: Secondary | ICD-10-CM | POA: Diagnosis not present

## 2024-02-17 DIAGNOSIS — Z791 Long term (current) use of non-steroidal anti-inflammatories (NSAID): Secondary | ICD-10-CM | POA: Diagnosis not present

## 2024-02-17 DIAGNOSIS — E66813 Obesity, class 3: Secondary | ICD-10-CM | POA: Diagnosis not present

## 2024-02-17 DIAGNOSIS — I10 Essential (primary) hypertension: Secondary | ICD-10-CM | POA: Diagnosis not present

## 2024-02-17 DIAGNOSIS — I2510 Atherosclerotic heart disease of native coronary artery without angina pectoris: Secondary | ICD-10-CM | POA: Insufficient documentation

## 2024-02-17 DIAGNOSIS — Z7982 Long term (current) use of aspirin: Secondary | ICD-10-CM | POA: Insufficient documentation

## 2024-02-17 DIAGNOSIS — Z6841 Body Mass Index (BMI) 40.0 and over, adult: Secondary | ICD-10-CM | POA: Insufficient documentation

## 2024-02-17 DIAGNOSIS — M19012 Primary osteoarthritis, left shoulder: Secondary | ICD-10-CM | POA: Diagnosis not present

## 2024-02-17 DIAGNOSIS — G4730 Sleep apnea, unspecified: Secondary | ICD-10-CM | POA: Insufficient documentation

## 2024-02-17 DIAGNOSIS — Z96652 Presence of left artificial knee joint: Secondary | ICD-10-CM | POA: Diagnosis not present

## 2024-02-17 DIAGNOSIS — K279 Peptic ulcer, site unspecified, unspecified as acute or chronic, without hemorrhage or perforation: Secondary | ICD-10-CM | POA: Diagnosis not present

## 2024-02-17 DIAGNOSIS — M19011 Primary osteoarthritis, right shoulder: Secondary | ICD-10-CM | POA: Diagnosis not present

## 2024-02-17 DIAGNOSIS — Z01818 Encounter for other preprocedural examination: Secondary | ICD-10-CM

## 2024-02-17 HISTORY — PX: REVERSE SHOULDER ARTHROPLASTY: SHX5054

## 2024-02-17 MED ORDER — PHENYLEPHRINE HCL (PRESSORS) 10 MG/ML IV SOLN
INTRAVENOUS | Status: DC | PRN
  Administered 2024-02-17: 40 ug via INTRAVENOUS
  Administered 2024-02-17: 80 ug via INTRAVENOUS

## 2024-02-17 MED ORDER — VANCOMYCIN HCL 1000 MG IV SOLR
INTRAVENOUS | Status: DC | PRN
  Administered 2024-02-17: 1000 mg

## 2024-02-17 MED ORDER — BUPIVACAINE HCL (PF) 0.25 % IJ SOLN
INTRAMUSCULAR | Status: AC
  Filled 2024-02-17: qty 30

## 2024-02-17 MED ORDER — ONDANSETRON HCL 4 MG/2ML IJ SOLN
INTRAMUSCULAR | Status: DC | PRN
  Administered 2024-02-17: 4 mg via INTRAVENOUS

## 2024-02-17 MED ORDER — BUPIVACAINE HCL (PF) 0.5 % IJ SOLN
INTRAMUSCULAR | Status: DC | PRN
  Administered 2024-02-17: 15 mL via PERINEURAL

## 2024-02-17 MED ORDER — PHENYLEPHRINE 80 MCG/ML (10ML) SYRINGE FOR IV PUSH (FOR BLOOD PRESSURE SUPPORT)
PREFILLED_SYRINGE | INTRAVENOUS | Status: AC
  Filled 2024-02-17: qty 10

## 2024-02-17 MED ORDER — MUPIROCIN 2 % EX OINT: 1.0000 | TOPICAL_OINTMENT | Freq: Every day | CUTANEOUS | 0 refills | Status: AC

## 2024-02-17 MED ORDER — FENTANYL CITRATE (PF) 100 MCG/2ML IJ SOLN
50.0000 ug | Freq: Once | INTRAMUSCULAR | Status: AC
  Administered 2024-02-17: 50 ug via INTRAVENOUS

## 2024-02-17 MED ORDER — MIDAZOLAM HCL 2 MG/2ML IJ SOLN
INTRAMUSCULAR | Status: AC
  Filled 2024-02-17: qty 2

## 2024-02-17 MED ORDER — TRIAMCINOLONE ACETONIDE 40 MG/ML IJ SUSP
INTRAMUSCULAR | Status: AC
  Filled 2024-02-17: qty 5

## 2024-02-17 MED ORDER — LACTATED RINGERS IV SOLN: INTRAVENOUS | Status: DC

## 2024-02-17 MED ORDER — DEXAMETHASONE SOD PHOSPHATE PF 10 MG/ML IJ SOLN
INTRAMUSCULAR | Status: DC | PRN
  Administered 2024-02-17: 4 mg via INTRAVENOUS

## 2024-02-17 MED ORDER — VANCOMYCIN HCL 1000 MG IV SOLR
INTRAVENOUS | Status: AC
  Filled 2024-02-17: qty 20

## 2024-02-17 MED ORDER — CEFAZOLIN SODIUM-DEXTROSE 2-4 GM/100ML-% IV SOLN
2.0000 g | INTRAVENOUS | Status: AC
  Administered 2024-02-17: 2 g via INTRAVENOUS

## 2024-02-17 MED ORDER — GABAPENTIN 300 MG PO CAPS: 300.0000 mg | ORAL_CAPSULE | Freq: Once | ORAL | Status: DC

## 2024-02-17 MED ORDER — FENTANYL CITRATE (PF) 100 MCG/2ML IJ SOLN: 25.0000 ug | INTRAMUSCULAR | Status: DC | PRN

## 2024-02-17 MED ORDER — ROCURONIUM BROMIDE 100 MG/10ML IV SOLN
INTRAVENOUS | Status: DC | PRN
  Administered 2024-02-17: 10 mg via INTRAVENOUS
  Administered 2024-02-17: 50 mg via INTRAVENOUS

## 2024-02-17 MED ORDER — 0.9 % SODIUM CHLORIDE (POUR BTL) OPTIME
TOPICAL | Status: DC | PRN
  Administered 2024-02-17: 1000 mL

## 2024-02-17 MED ORDER — ACETAMINOPHEN 500 MG PO TABS: 1000.0000 mg | ORAL_TABLET | Freq: Once | ORAL | Status: AC

## 2024-02-17 MED ORDER — TRANEXAMIC ACID-NACL 1000-0.7 MG/100ML-% IV SOLN
1000.0000 mg | INTRAVENOUS | Status: AC
  Administered 2024-02-17: 1000 mg via INTRAVENOUS

## 2024-02-17 MED ORDER — SUGAMMADEX SODIUM 200 MG/2ML IV SOLN
INTRAVENOUS | Status: DC | PRN
  Administered 2024-02-17: 200 mg via INTRAVENOUS

## 2024-02-17 MED ORDER — PROPOFOL 10 MG/ML IV BOLUS
INTRAVENOUS | Status: DC | PRN
  Administered 2024-02-17: 100 mg via INTRAVENOUS
  Administered 2024-02-17: 20 mg via INTRAVENOUS

## 2024-02-17 MED ORDER — FENTANYL CITRATE (PF) 100 MCG/2ML IJ SOLN
INTRAMUSCULAR | Status: DC | PRN
  Administered 2024-02-17: 50 ug via INTRAVENOUS

## 2024-02-17 MED ORDER — CEFAZOLIN SODIUM-DEXTROSE 2-4 GM/100ML-% IV SOLN
INTRAVENOUS | Status: AC
  Filled 2024-02-17: qty 100

## 2024-02-17 MED ORDER — LIDOCAINE 2% (20 MG/ML) 5 ML SYRINGE
INTRAMUSCULAR | Status: AC
  Filled 2024-02-17: qty 5

## 2024-02-17 MED ORDER — ACETAMINOPHEN 500 MG PO TABS
1000.0000 mg | ORAL_TABLET | Freq: Once | ORAL | Status: AC
  Administered 2024-02-17: 1000 mg via ORAL

## 2024-02-17 MED ORDER — PHENYLEPHRINE HCL-NACL 20-0.9 MG/250ML-% IV SOLN
INTRAVENOUS | Status: DC | PRN
  Administered 2024-02-17: 40 ug/min via INTRAVENOUS

## 2024-02-17 MED ORDER — BUPIVACAINE LIPOSOME 1.3 % IJ SUSP
INTRAMUSCULAR | Status: DC | PRN
  Administered 2024-02-17: 10 mL via PERINEURAL

## 2024-02-17 MED ORDER — OXYCODONE HCL 5 MG PO TABS: 5.0000 mg | ORAL_TABLET | Freq: Once | ORAL | Status: DC | PRN

## 2024-02-17 MED ORDER — AMISULPRIDE (ANTIEMETIC) 5 MG/2ML IV SOLN: 10.0000 mg | Freq: Once | INTRAVENOUS | Status: DC | PRN

## 2024-02-17 MED ORDER — ROCURONIUM BROMIDE 10 MG/ML (PF) SYRINGE
PREFILLED_SYRINGE | INTRAVENOUS | Status: AC
  Filled 2024-02-17: qty 10

## 2024-02-17 MED ORDER — FENTANYL CITRATE (PF) 100 MCG/2ML IJ SOLN
INTRAMUSCULAR | Status: AC
  Filled 2024-02-17: qty 2

## 2024-02-17 MED ORDER — POVIDONE-IODINE 10 % EX SOLN
CUTANEOUS | Status: DC | PRN
  Administered 2024-02-17: 1 via TOPICAL

## 2024-02-17 MED ORDER — TRANEXAMIC ACID-NACL 1000-0.7 MG/100ML-% IV SOLN
INTRAVENOUS | Status: AC
  Filled 2024-02-17: qty 100

## 2024-02-17 MED ORDER — LIDOCAINE HCL (CARDIAC) PF 100 MG/5ML IV SOSY
PREFILLED_SYRINGE | INTRAVENOUS | Status: DC | PRN
  Administered 2024-02-17: 50 mg via INTRAVENOUS

## 2024-02-17 MED ORDER — OXYCODONE HCL 5 MG/5ML PO SOLN: 5.0000 mg | Freq: Once | ORAL | Status: DC | PRN

## 2024-02-17 MED ORDER — DEXAMETHASONE SOD PHOSPHATE PF 10 MG/ML IJ SOLN
INTRAMUSCULAR | Status: AC
  Filled 2024-02-17: qty 1

## 2024-02-17 MED ORDER — ACETAMINOPHEN 500 MG PO TABS
ORAL_TABLET | ORAL | Status: AC
  Filled 2024-02-17: qty 2

## 2024-02-17 MED ORDER — ONDANSETRON HCL 4 MG/2ML IJ SOLN
INTRAMUSCULAR | Status: AC
  Filled 2024-02-17: qty 2

## 2024-02-17 MED ORDER — PROPOFOL 10 MG/ML IV BOLUS
INTRAVENOUS | Status: AC
  Filled 2024-02-17: qty 20

## 2024-02-17 NOTE — Progress Notes (Signed)
Assisted Dr. Armond Hang with right, interscalene , ultrasound guided block. Side rails up, monitors on throughout procedure. See vital signs in flow sheet. Tolerated Procedure well.

## 2024-02-17 NOTE — Brief Op Note (Signed)
" ° °  Brief Op Note  Date of Surgery: 02/17/2024  Preoperative Diagnosis: RIGHT GLENOHUMERAL ARTHRITIS  Postoperative Diagnosis: same  Procedure: Procedures: ARTHROPLASTY, SHOULDER, TOTAL, REVERSE RIGHT WITH steroid injection Left shoulder  Implants: Implant Name Type Inv. Item Serial No. Manufacturer Lot No. LRB No. Used Action  AUGMENT BASEPLATE 15DEG 25 WDG - [id number] Joint AUGMENT BASEPLATE 15DEG 25 WDG [id number] [employer]  Right 1 Implanted  SCREW BONE INTRNL SM 7 - [id number] Screw SCREW BONE INTRNL SM 7 [id number] [employer]  Right 1 Implanted  GLENOSPHERE REV SHOULDER 36 - [id number] Joint GLENOSPHERE REV SHOULDER 36 [id number] [employer]  Right 1 Implanted  SCREW 5.5X26 - [id number] Screw SCREW 5.5X26  [employer] ON STERILE TRAY Right 1 Implanted  SCREW PERIPHERAL 30 - [id number] Screw SCREW PERIPHERAL 30  [employer] ON STERILE TRAY Right 1 Implanted  SCREW 5.0X18 - [id number] Screw SCREW 5.0X18  [employer] ON STERILE TRAY Right 1 Implanted  SCREW 5.5X26 - [id number] Screw SCREW 5.5X26  [employer] ON STERILE TRAY Right 1 Implanted  STEM HUM PLUS SHORT SZ1+ - [id number] Stem STEM HUM PLUS SHORT SZ1+ [id number] [employer]  Right 1 Implanted  INSERT REVERSED HUMERAL SIZE 1 - [id number] Orthopedic Implant INSERT REVERSED HUMERAL SIZE 1 [id number] [employer]  Right 1 Implanted    Surgeons: Surgeon(s): Genelle Standing, MD  Anesthesia: General    Estimated Blood Loss: See anesthesia record  Complications: None  Condition to PACU: Stable  Standing LITTIE Genelle, MD 02/17/2024 9:16 AM  "

## 2024-02-17 NOTE — Therapy (Signed)
 " OUTPATIENT PHYSICAL THERAPY SHOULDER/ELBOW EVALUATION/TREATMENT  Patient Name: Wanda Lang MRN: [id number] DOB:[birth year], 74 y.o., female Today's Date: 02/19/2024  END OF SESSION:  PT End of Session - 02/19/24 0804     Visit Number 1    Number of Visits 25    Date for Recertification  05/13/24    PT Start Time 0805    PT Stop Time 0850    PT Time Calculation (min) 45 min    Activity Tolerance Patient tolerated treatment well    Behavior During Therapy [facility] for tasks assessed/performed          Past Medical History:  Diagnosis Date   Allergy    Arthritis    Coronary artery disease    Fatty liver    GERD (gastroesophageal reflux disease)    Heart murmur    Hypertension    Hx   LIVER FUNCTION TESTS, ABNORMAL, HX OF 09/08/2007   Qualifier: Diagnosis of  By: Marcelo CMA (AAMA), Dottie     Neuromuscular disorder [facility])    Essential Tremor   Overactive bladder    Past Surgical History:  Procedure Laterality Date   ABDOMINAL HYSTERECTOMY     CARDIAC VALVE REPLACEMENT  11/13/2023   COLONOSCOPY WITH PROPOFOL  N/A 09/04/2018   Procedure: COLONOSCOPY WITH PROPOFOL ;  Surgeon: Janalyn Keene NOVAK, MD;  Location: [facility] END[facility]COPY;  Service: Endoscopy;  Laterality: N/A;   ESOPHAGOGASTRODUODENOSCOPY (EGD) WITH PROPOFOL  N/A 09/04/2018   Procedure: ESOPHAGOGASTRODUODENOSCOPY (EGD) WITH PROPOFOL ;  Surgeon: Janalyn Keene NOVAK, MD;  Location: [facility] ENDOSCOPY;  Service: Endoscopy;  Laterality: N/A;   LAPAROSCOPY     for endometriosis x2   MOUTH SURGERY  04/2023   tooth extraction, gum graft   REVERSE SHOULDER ARTHROPLASTY Right 02/17/2024   Procedure: ARTHROPLASTY, SHOULDER, TOTAL, REVERSE RIGHT WITH steroid injection Left shoulder;  Surgeon: Genelle Standing, MD;  Location: [facility];  Service: Orthopedics;  Laterality: Right;   RIGHT/LEFT HEART CATH AND CORONARY ANGIOGRAPHY Bilateral 09/19/2023   Procedure: RIGHT/LEFT HEART CATH AND CORONARY ANGIOGRAPHY;   Surgeon: Ammon Blunt, MD;  Location: [facility] INVASIVE [facility];  Service: Cardiovascular;  Laterality: Bilateral;   TONSILLECTOMY     TOTAL HIP ARTHROPLASTY Right 05/15/2022   Procedure: RIGHT TOTAL HIP ARTHROPLASTY ANTERIOR APPROACH;  Surgeon: Vernetta Lonni GRADE, MD;  Location: [facility] OR;  Service: Orthopedics;  Laterality: Right;   TOTAL KNEE ARTHROPLASTY Left 07/18/2021   Procedure: TOTAL KNEE ARTHROPLASTY;  Surgeon: Ernie Cough, MD;  Location: [facility] ORS;  Service: Orthopedics;  Laterality: Left;   TURBINATE REDUCTION     Patient Active Problem List   Diagnosis Date Noted   Osteoarthritis of right shoulder 02/17/2024   Primary osteoarthritis, left shoulder 02/17/2024   S/P TAVR (transcatheter aortic valve replacement) 12/05/2023   Skin wound from surgical incision 12/05/2023   Elevated LFTs 10/10/2023   Protracted URI 05/03/2023   Acute non-recurrent pansinusitis 02/04/2023   OSA (obstructive sleep apnea) 11/14/2022   Arthritis 11/14/2022   Nocturia 10/05/2022   Hypersomnia 10/05/2022   S/P total hip arthroplasty 05/17/2022   Status post total replacement of right hip 05/15/2022   Overactive bladder 04/04/2022   Postmenopausal estrogen deficiency 11/13/2021   Primary osteoarthritis 11/13/2021   Leg length inequality 11/06/2021   Impaired ambulation 10/23/2021   Contact dermatitis 10/16/2021   SI joint arthritis 08/24/2021   Spinal stenosis, lumbar region, without neurogenic claudication 08/24/2021   Degeneration of lumbar intervertebral disc 07/24/2021   S/P total knee arthroplasty, left 07/18/2021   Preop examination 07/07/2021  Right hip pain 07/07/2021   Low back pain 07/07/2021   Drug-induced immunodeficiency 06/12/2021   Impingement syndrome of right shoulder region 06/12/2021   COVID-19 01/24/2021   Heart murmur 11/24/2020   Hammer toe 04/22/2020   Metatarsalgia of left foot 04/22/2020   Osteoarthritis of midfoot 04/22/2020   Duodenal ulcer 11/12/2019    Stress 11/12/2019   Morbid obesity (HCC) 11/06/2019   Pain in left knee 07/30/2019   Aortic stenosis, moderate 02/06/2018   Chronic diarrhea 10/09/2017   Acid reflux 07/15/2017   Asymptomatic carotid artery stenosis 07/15/2017   Cyst of skin 07/15/2017   Right maxillary sinusitis 11/09/2016   Immunocompromised 11/09/2016   Allergic rhinitis 11/02/2016   Benign essential tremor 11/02/2016   History of hypertension 11/02/2016   Postmenopausal symptoms 07/01/2015   Fatty liver 09/08/2007   Rheumatoid arthritis (HCC) 09/08/2007    PCP: Gretel App, NP    REFERRING PROVIDER: Genelle Standing, MD  REFERRING DIAG:  M19.011 (ICD-10-CM) - Osteoarthritis of right shoulder, unspecified osteoarthritis type      RATIONALE FOR EVALUATION AND TREATMENT: Rehabilitation  THERAPY DIAG: Stiffness of right shoulder, not elsewhere classified  Right shoulder pain, unspecified chronicity  Muscle weakness (generalized)  ONSET DATE: Date of Surgery: 02/17/2024  FOLLOW-UP APPT SCHEDULED WITH REFERRING PROVIDER: Yes    SUBJECTIVE:                                                                                                                                                                                         SUBJECTIVE STATEMENT:    Patient arrives to OPPT with a chief concern of R shoulder pain and stiffness s/p Reverse Total Shoulder Arthroplasty on 02/17/2024.   PERTINENT HISTORY:   [name] is a 74 y.o. female presenting to OPPT s/p R RTSA with biceps tenodesis on 02/17/2024. Since the surgery the patient has rested with R Shoulder in shoulder immobilizer. She reports that she has been going well. She reports that her sister is taking care of her for the next few days. She reports that she's been actively moving her elbow and wrist joint however she has questions about moving the shoulder joint. Patient reports that moved her arm passively through external rotation but she knows  not to reach behind her back.   PMH significant for: Hx of Surgery: R THA 04/24; L TKA 06/23  Dominant hand: right  Imaging: Yes   Red flags (chills/fever, night sweats, nausea, vomiting, unrelenting pain, unexplained weight gain/loss): Negative.  PAIN:  Pain Intensity: Present: 2/10, Best: 0/10, Worst: 2/10 (Nerve Block) Pain location: Incisional Site  Pain Quality: constant and aching itches Radiating: No  Numbness/Tingling: No  She believes the nerve block is still in the system  Aggravating factors: Haven't moved shoulder into any positions of provocation  Relieving factors: Veterinary Surgeon and Rest   PRECAUTIONS: Shoulder  WEIGHT BEARING RESTRICTIONS: Yes Avoid WBing  FALLS: Has patient fallen in last 6 months? No  Living Environment Lives with: lives alone Lives in: House/apartment 2-Story  Stairs: Yes: Internal: 13-15 steps; can reach both Has following equipment at home: Chair lift on stairs; walking stick, FWW  Prior level of function: Independent  Occupational demands: Retired   Patient Goals: Patient would like to dress comfortably and use her arm like normal     OBJECTIVE:   Patient Surveys   QuickDASH: QUICK DASH  Please rate your ability do the following activities in the last week by selecting the number below the appropriate response.   Activities Rating  Open a tight or new jar.  5 = Unable  Do heavy household chores (e.g., wash walls, floors). 5 = Unable  Carry a shopping bag or briefcase 5 = Unable  Wash your back. 5 = Unable  Use a knife to cut food. 4 = Severe difficulty  Recreational activities in which you take some force or impact through your arm, shoulder or hand (e.g., golf, hammering, tennis, etc.). 5 = Unable  During the past week, to what extent has your arm, shoulder or hand problem interfered with your normal social activities with family, friends, neighbors or groups?  5 = Extremely  During the past week, were you limited  in your work or other regular daily activities as a result of your arm, shoulder or hand problem? 5 = Unable  Rate the severity of the following symptoms in the last week: Arm, Shoulder, or hand pain. 2 = Mild  Rate the severity of the following symptoms in the last week: Tingling (pins and needles) in your arm, shoulder or hand. 2 = Mild  During the past week, how much difficulty have you had sleeping because of the pain in your arm, shoulder or hand?  2 = Mild difficulty   (A QuickDASH score may not be calculated if there is greater than 1 missing item.)  Quick Dash Disability/Symptom Score: 77.3 / 100 = 77.3 % (0/100 = no disability)  Minimally Clinically Important Difference (MCID): 15-20 points  (Franchignoni, F. et al. (2013). Minimally clinically important difference of the disabilities of the arm, shoulder, and hand outcome measures (DASH) and its shortened version (Quick DASH). Journal of Orthopaedic & Sports Physical Therapy, 44(1), 30-39)   Cognition Overall cognitive status: WFL for tasks assessed.     Gross Musculoskeletal Assessment Tremor: None Bulk: Normal Tone: Normal  Gait   Posture   Cervical Screen AROM: WFL and painless with overpressure in all planes   AROM AROM (Normal range in degrees) AROM  Cervical  Flexion (50) WFL  Extension (80) WFL  Right lateral flexion (45) WFL  Left lateral flexion (45) WFL  Right rotation (85) WFL  Left rotation (85) WFL   Right Left  Shoulder    Flexion  160  Extension    Abduction  140  External Rotation  75  Internal Rotation    Hands Behind Head    Hands Behind Back        Elbow    Flexion WNL WNL  Extension WNL WNL  Pronation    Supination    (* = pain; Blank rows = not tested)  UE MMT: MMT (out of 5) Right  Left   Cervical (isometric)  Flexion WNL  Extension WNL  Lateral Flexion WNL WNL  Rotation WNL WNL      Shoulder   Flexion  4  Extension    Abduction  4  External rotation  4-  Internal  rotation  4-  Horizontal abduction    Horizontal adduction    Lower Trapezius    Rhomboids        Elbow  Flexion  4  Extension  4  Pronation    Supination        (* = pain; Blank rows = not tested)  Sensation Grossly intact to light touch bilateral UE as determined by testing dermatomes C2-T2. Proprioception and hot/cold testing deferred on this date.  Reflexes Deferred  Palpation Location LEFT  RIGHT           Subocciptials    Cervical paraspinals  0  Upper Trapezius  1  Levator Scapulae    Rhomboid Major/Minor    Sternoclavicular joint    Acromioclavicular joint    Coracoid process    Long head of biceps    Supraspinatus  0  Infraspinatus  0  Subscapularis    Teres Minor    Teres Major    Pectoralis Major    Pectoralis Minor    Anterior Deltoid  1  Lateral Deltoid  1  Posterior Deltoid    Latissimus Dorsi    Sternocleidomastoid    (Blank rows = not tested) Graded on 0-4 scale (0 = no pain, 1 = pain, 2 = pain with wincing/grimacing/flinching, 3 = pain with withdrawal, 4 = unwilling to allow palpation), (Blank rows = not tested)   Passive Accessory Intervertebral Motion Deferred  Accessory Motions/Glides Glenohumeral: Deferred  Posterior:  Inferior:  Anterior:   TODAY'S TREATMENT  DATE: 02/19/2024  Self Care/Management Training (10 min):   Discussion and Education on proper edema control, wound care and signs of infection following surgery. Education on proper wear schedule for shoulder immobilizer in order to decrease risk of adhesive capsulitis and maintaining ROM in elbow/wrist joint.   Therapeutic Exercise (10 min):   Reviewed HEP with return demonstration (see below for HEP)    Seated Pully PROM    Shoulder flexion     1 x 20 reps - able to get to 60 deg   Shoulder Abd    1 x 20 - able to get to 60 deg  PATIENT EDUCATION:  Education details: HEP, Prognosis, POC Person educated: Patient Education method: Explanation, Demonstration, Tactile  cues, and Handouts Education comprehension: verbalized understanding, returned demonstration, and needs further education   HOME EXERCISE PROGRAM:  Access Code: [email or web address] URL: [email or web address] Date: 02/19/2024 Prepared by: Lonni Viana Sleep  Exercises - Seated Shoulder Flexion Towel Slide at Table Top  - 2 x daily - 7 x weekly - 2-3 sets - 10 reps - Seated Shoulder Abduction Towel Slide at Table Top  - 2 x daily - 7 x weekly - 2-3 sets - 10 reps - Seated Shoulder Pendulum Exercise  - 2 x daily - 7 x weekly - 2-3 sets - 10 reps - Forearm Pronation and Supination With Shoulder Sling  - 2 x daily - 7 x weekly - 3 sets - 10 reps - Closing and Opening Hand With Shoulder Sling  - 2 x daily - 7 x weekly - 3 sets - 10 reps - Shoulder Rolls in Sitting  - 2 x daily - 7 x weekly - 3 sets - 10 reps  ASSESSMENT:  CLINICAL IMPRESSION: [name] is a 74 y.o. female who was seen today for physical therapy evaluation and treatment for s/p Reverse Total Shoulder Arthroplasty 02/17/2024. She presents with deficits in UE strength, ROM and function secondary to shoulder surgery as expected. Patient performed all HEP exercises within pain tolerance and able to achieve 60 deg of PROM/AROM. Self report of 77.3 / 100 = 77.3 % on quick dash indicating moderate to severe disability to functional movements and ADLs. Educated patient on proper wear schedule for sling (remove for grooming and HEP). Educated patient on precautions and motions to avoid with good understanding. No wound assessment performed however patient reports that sister (sharon) inspected incision site and it was cleared of signs of infection. Pt able to donn sling independently prior to end of session. NO further questions and initial HEP provided to maintain and progress ROM of shoulder. Pain to be reassessed at next appt due to nerve block. Patient's deficits are limiting her full participation with independent ADLs and  recreational tasks. Based on today's performance, pt will continue to benefit from skilled PT in order to facilitate return to PLOF and improve QoL.    OBJECTIVE IMPAIRMENTS: decreased activity tolerance, decreased ROM, decreased strength, increased edema, increased muscle spasms, impaired sensation, and pain.   ACTIVITY LIMITATIONS: carrying, lifting, transfers, bathing, dressing, reach over head, and hygiene/grooming  PARTICIPATION LIMITATIONS: driving, community activity, and church  PERSONAL FACTORS: Age, Past/current experiences, Social background, Time since onset of injury/illness/exacerbation, and 3+ comorbidities: S/P TAVR (transcatheter aortic valve replacement), 1 OA in LUE, Hx of prev surgeries (THA, TKA SEE ABOVE) are also affecting patient's functional outcome.   REHAB POTENTIAL: Good  CLINICAL DECISION MAKING: Evolving/moderate complexity  EVALUATION COMPLEXITY: Moderate   GOALS: Goals reviewed with patient? Yes  SHORT TERM GOALS: Target date: 03/18/2024  Pt will be independent with HEP to improve strength and decrease shoulder pain to improve pain-free function at home and work. Baseline: Initial provided Goal status: INITIAL   LONG TERM GOALS: Target date: 04/15/2024  Pt will increase L shoulder AROM flexion/abduction to 130 deg without pain in order to demonstrate improvements for OH reaching and MD protocol.  Baseline: < 30 deg AROM flex/abd Goal status: INITIAL  2.  Pt will decrease worst shoulder pain by at least 3 points on the NPRS in order to demonstrate clinically significant reduction in shoulder pain. Baseline: Pending  Goal status: INITIAL  3.  Pt will decrease quick DASH score by at least 8% in order to demonstrate clinically significant reduction in disability related to shoulder pain        Baseline: 77.3 / 100 = 77.3 %  Goal status: INITIAL  4. Pt will increase strength in shoulder flexion and abduction by at least 1/2 MMT grade in order to  demonstrate improvement in strength and function         Baseline: Not Tested  Goal status: INITIAL  PLAN:  PT FREQUENCY: 1-2x/week  PT DURATION: 12 weeks  PLANNED INTERVENTIONS: [id number]- PT Re-evaluation, 97750- Physical Performance Testing, 97110-Therapeutic exercises, 97530- Therapeutic activity, [id number]- Neuromuscular re-education, 97535- Self Care, [id number]- Manual therapy, 97760- Orthotic Initial, [id number]- Orthotic/Prosthetic subsequent, Patient/Family education, Joint mobilization, Scar mobilization, Cryotherapy, and Moist heat  PLAN FOR NEXT SESSION: Review HEP, initiate PROM > AAROM > AROM exercises for R GHJ, Scapular exercises, elbow AROM and wrist AROM   Lonni Pall PT, DPT Physical Therapist- [place]  02/19/2024, 8:07 AM  "

## 2024-02-17 NOTE — Anesthesia Preprocedure Evaluation (Addendum)
"                                    Anesthesia Evaluation  Patient identified by MRN, date of birth, ID band Patient awake    Reviewed: Allergy & Precautions, NPO status , Patient's Chart, lab work & pertinent test results  Airway Mallampati: I  TM Distance: >3 FB Neck ROM: Full    Dental no notable dental hx. (+) Teeth Intact, Dental Advisory Given   Pulmonary sleep apnea and Continuous Positive Airway Pressure Ventilation    Pulmonary exam normal breath sounds clear to auscultation       Cardiovascular hypertension, + CAD  Normal cardiovascular exam+ Valvular Problems/Murmurs (s/p TAVR 2025) AS  Rhythm:Regular Rate:Normal  TTE 2025 NORMAL LEFT VENTRICULAR SYSTOLIC FUNCTION WITH MILD LVH  ESTIMATED EF: >55%  NORMAL LA PRESSURES WITH DIASTOLIC DYSFUNCTION (GRADE 1)  NORMAL RIGHT VENTRICULAR SYSTOLIC FUNCTION  VALVULAR REGURGITATION: No AR, TRIVIAL MR, No PR, TRIVIAL TR                              VALVULAR STENOSIS: TAVR, MILD MS, No PS, No TS     Neuro/Psych negative neurological ROS  negative psych ROS   GI/Hepatic Neg liver ROS, PUD,GERD  ,,  Endo/Other    Class 3 obesity (BMI 40)  Renal/GU negative Renal ROS  negative genitourinary   Musculoskeletal  (+) Arthritis ,    Abdominal   Peds  Hematology negative hematology ROS (+)   Anesthesia Other Findings   Reproductive/Obstetrics                              Anesthesia Physical Anesthesia Plan  ASA: 3  Anesthesia Plan: General and Regional   Post-op Pain Management: Tylenol  PO (pre-op)* and Regional block*   Induction: Intravenous  PONV Risk Score and Plan: 3 and Dexamethasone , Ondansetron  and Treatment may vary due to age or medical condition  Airway Management Planned: Oral ETT  Additional Equipment:   Intra-op Plan:   Post-operative Plan: Extubation in OR  Informed Consent: I have reviewed the patients History and Physical, chart, labs and discussed  the procedure including the risks, benefits and alternatives for the proposed anesthesia with the patient or authorized representative who has indicated his/her understanding and acceptance.     Dental advisory given  Plan Discussed with: CRNA  Anesthesia Plan Comments:          Anesthesia Quick Evaluation  "

## 2024-02-17 NOTE — Anesthesia Procedure Notes (Signed)
 Procedure Name: Intubation Date/Time: 02/17/2024 7:51 AM  Performed by: Kathern Rollene LABOR, CRNAPre-anesthesia Checklist: Patient identified, Emergency Drugs available, Suction available and Patient being monitored Patient Re-evaluated:Patient Re-evaluated prior to induction Oxygen Delivery Method: Circle system utilized Preoxygenation: Pre-oxygenation with 100% oxygen Induction Type: IV induction Ventilation: Mask ventilation without difficulty Laryngoscope Size: Mac and 3 Grade View: Grade II Tube type: Oral Tube size: 7.0 mm Number of attempts: 1 Airway Equipment and Method: Stylet Placement Confirmation: ETT inserted through vocal cords under direct vision, positive ETCO2 and breath sounds checked- equal and bilateral Secured at: 22 cm Tube secured with: Tape Dental Injury: Teeth and Oropharynx as per pre-operative assessment

## 2024-02-17 NOTE — Anesthesia Procedure Notes (Signed)
 Anesthesia Regional Block: Interscalene brachial plexus block   Pre-Anesthetic Checklist: , timeout performed,  Correct Patient, Correct Site, Correct Laterality,  Correct Procedure, Correct Position, site marked,  Risks and benefits discussed,  Pre-op evaluation,  At surgeon's request and post-op pain management  Laterality: Right  Prep: Maximum Sterile Barrier Precautions used, chloraprep       Needles:  Injection technique: Single-shot  Needle Type: Echogenic Stimulator Needle     Needle Length: 5cm  Needle Gauge: 21     Additional Needles:   Procedures:,,,, ultrasound used (permanent image in chart),,    Narrative:  Start time: 02/17/2024 7:20 AM End time: 02/17/2024 7:24 AM Injection made incrementally with aspirations every 5 mL. Anesthesiologist: Niels Marien CROME, MD

## 2024-02-17 NOTE — Anesthesia Postprocedure Evaluation (Signed)
"   Anesthesia Post Note  Patient: Wanda Lang  Procedure(s) Performed: ARTHROPLASTY, SHOULDER, TOTAL, REVERSE RIGHT WITH steroid injection Left shoulder (Right: Shoulder)     Patient location during evaluation: PACU Anesthesia Type: Regional and General Level of consciousness: awake and alert Pain management: pain level controlled Vital Signs Assessment: post-procedure vital signs reviewed and stable Respiratory status: spontaneous breathing, nonlabored ventilation, respiratory function stable and patient connected to nasal cannula oxygen Cardiovascular status: blood pressure returned to baseline and stable Postop Assessment: no apparent nausea or vomiting Anesthetic complications: no   No notable events documented.  Last Vitals:  Vitals:   02/17/24 1020 02/17/24 1057  BP:  130/76  Pulse: 64 78  Resp: 16 18  Temp:  (!) 36.4 C  SpO2: 95% 93%    Last Pain:  Vitals:   02/17/24 1057  TempSrc:   PainSc: 0-No pain                 Dorien Bessent L Kijana Cromie      "

## 2024-02-17 NOTE — Op Note (Addendum)
 "  Date of Surgery: 02/17/2024  INDICATIONS: Wanda Lang is a 74 y.o.-year-old female with right shoulder osteoarthritis.  The risk and benefits of the procedure were discussed in detail and documented in the pre-operative evaluation.   PREOPERATIVE DIAGNOSIS: 1. Right shoulder osteoarthritis 2. Left shoulder osteoarthritis  POSTOPERATIVE DIAGNOSIS: Same.  PROCEDURE: 1. Right shoulder reverse shoulder arthroplasty 2. Right shoulder biceps tenodesis 3.  Left shoulder large joint injection with 0.25% Marcaine  4 cc and 2 cc of 40 mg/cc Kenalog   SURGEON: Wanda LITTIE Parker MD  ASSISTANT: Conley Dawson, ATC  ANESTHESIA:  general  IV FLUIDS AND URINE: See anesthesia record.  ANTIBIOTICS: Ancef   ESTIMATED BLOOD LOSS: 10 mL.  IMPLANTS:  Implant Name Type Inv. Item Serial No. Manufacturer Lot No. LRB No. Used Action  AUGMENT BASEPLATE 15DEG 25 WDG - [id number] Joint AUGMENT BASEPLATE 15DEG 25 WDG [id number] [employer]  Right 1 Implanted  SCREW BONE INTRNL SM 7 - [id number] Screw SCREW BONE INTRNL SM 7 [id number] [employer]  Right 1 Implanted  GLENOSPHERE REV SHOULDER 36 - [id number] Joint GLENOSPHERE REV SHOULDER 36 [id number] [employer]  Right 1 Implanted  SCREW 5.5X26 - [id number] Screw SCREW 5.5X26  [employer] ON STERILE TRAY Right 1 Implanted  SCREW PERIPHERAL 30 - [id number] Screw SCREW PERIPHERAL 30  [employer] ON STERILE TRAY Right 1 Implanted  SCREW 5.0X18 - [id number] Screw SCREW 5.0X18  [employer] ON STERILE TRAY Right 1 Implanted  SCREW 5.5X26 - [id number] Screw SCREW 5.5X26  [employer] ON STERILE TRAY Right 1 Implanted  STEM HUM PLUS SHORT SZ1+ - [id number] Stem STEM HUM PLUS SHORT SZ1+ [id number] [employer]  Right 1 Implanted  INSERT REVERSED HUMERAL SIZE 1 - [id number] Orthopedic Implant INSERT REVERSED HUMERAL SIZE 1 [id number] [employer]  Right 1 Implanted    DRAINS: None  CULTURES: None  COMPLICATIONS: none  DESCRIPTION OF PROCEDURE:  Patient was identified  in the preoperative holding area.  Anesthesia performed an interscalene nerve block after universal timeout was performed with nursing.  Ancef  was given 1 hour prior to skin incision.    The surgical site was scrubbed with a chlorhexidine  scrub brush and alcohol.  The patient was then prepped with chlorhexidine  skin prep.  The patient was subsequently taken back to the operating room.  Anesthesia was induced. The patient was transferred to the beachchair position.  All bony prominences were padded.  Final timeout was again performed.     The bony landmarks of the shoulder were marked with a marking pen. A delto-pectoral incision was made, extending up approximately 5 inches. The wound with then irrigated with dilute betadine . Cephalic vein was identified, and an protected. This was retracted medially. Subdeltoid and subpectoral lesions were released. Neurovascular structures were carefully protected. The Gelpi retractor was used to retract the deltoid and pectoralis major.    The deltoid was retracted laterally with a Brown humeral retractor.  The conjoined tendon was identified. The cleido-pectoral fascia was excised.  The axillary nerve was palpated and carefully protected throughout the procedure. The biceps tendon was found and tenodesed to the upper pec with # 2 Ethibond non-absorbable suture.  Proximally the biceps tendon was removed up to the joint.  The bicipital groove was used for a landmark to establish rotator cuff interval. The subscap was tagged with a #2 Ethibond.  At this point the subscapularis was peeled off from the lesser tuberosity with care to avoid dissection distally in order to protect the axillary nerve.  Once the  joint was exposed the proximal humerus was delivered with external rotation and extension of the arm. The humerus was prepped initially by performing a humeral neck cut. This was done with the guide using 30 degrees of retroversion as a reference.  The head portion was  removed.  A medullary sounding reamer was then used.  We subsequently placed our guidewire through the center of the humeral head using the reference guide.  This was a size 1.  Metaphyseal reamer was then used.  Finally the size 1 broach was malleted into place with excellent purchase.  A tonsil clamp was used to attempt to pull this out with very good purchase   Attention was then turned to the glenoid.  Posteriorly a large Darach retractor was used.  A 360 Degree release of the subscapularis and glenoid were done. The capsule was released from the humerus.    Glenoid retractors were placed posteriorly, superiorly behind the biceps tendon and anteriorly on the glenoid neck. A 360-degree release of the capsule was performed with cautery.  The triceps was released off the inferior tubercle of the glenoid. The axillary nerve was carefully protected with the surgeon's index finger, retracting it and using cautery.   A guidepin was placed through the glenoid guide. The guidepin was drilled until it exited the cortex. The guidepin was over drilled. Next, the glenoid was prepared with the reamer down to cortical bone.  The central peg hole was totally within the scapular neck tested with the probe.  The baseplate was then placed screwed securely with good purchase in position and then secured with 4 screws. In each case, they were drilled and measured and the appropriate length screw placed with excellent rigid fixation of the baseplate. The glenosphere was placed with size based based on pre-operative templating.   The humerus was then delivered and a neutral polyethylene trial was placed.  This was brought to just the level of the reduction but not completely reduced.  A 24final poly was selected and impacted.    Appropriate tension was noted on the conjoined tendon and deltoid muscle.  Extension was stable, external and internal rotation as well.  The subscap was pulled over but as this was not able to reach  comfortably decision was made not to repair in order to prevent limited in external rotation.  The wound was then irrigated. Vancomycin  powder was placed in the wound again for infection prevention.   The wound was then closed in layers with 0 Vicryl interrupted in the deep subcu followed by 2-0 Vicryl in the superficial subcu and 3-0 nylon for skin.  An Aquacel dressing was applied as well as an Veterinary surgeon.  A shoulder immobilizer was applied.   Additional timeout was performed.  The left shoulder was injected to the lateral shoulder into the subacromial space using anatomic landmarks.  Band-Aid was placed over this.   Postoperative Plan: -The patient will begin the reverse shoulder rehab protocol  -Aspirin  325 mg daily will be used for 4 weeks for blood clot prevention -I will see the patient back in 2 weeks for first postoperative wound check      Wanda LITTIE Parker, MD 9:16 AM    "

## 2024-02-17 NOTE — Interval H&P Note (Signed)
 History and Physical Interval Note:  02/17/2024 6:40 AM  Wanda Lang  has presented today for surgery, with the diagnosis of RIGHT GLENOHUMERAL ARTHRITIS.  The various methods of treatment have been discussed with the patient and family. After consideration of risks, benefits and other options for treatment, the patient has consented to  Procedures: ARTHROPLASTY, SHOULDER, TOTAL, REVERSE (Right) as a surgical intervention.  The patient's history has been reviewed, patient examined, no change in status, stable for surgery.  I have reviewed the patient's chart and labs.  Questions were answered to the patient's satisfaction.     Wafa Martes

## 2024-02-17 NOTE — H&P (Signed)
 Expand All Collapse All        Chief Complaint: Right shoulder pain        History of Present Illness:      Wanda Lang is a 74 y.o. female right dominant female presents with ongoing right shoulder pain and severe limited motion.  She does have a history of right hip osteoarthritis as well as arthroplasty.  She has been undergoing physical therapy with Dr. Henretta as well as had an injection in the past with limited relief.  She is here today for further discussion.  This is her dominant arm and she is having difficulty with active range of motion or ADLs including doing her hair.       PMH/PSH/Family History/Social History/Meds/Allergies:         Past Medical History:  Diagnosis Date   Allergy     Arthritis     Coronary artery disease     Fatty liver     GERD (gastroesophageal reflux disease)     Heart murmur     Hypertension      Hx   LIVER FUNCTION TESTS, ABNORMAL, HX OF 09/08/2007    Qualifier: Diagnosis of  By: Nelson-Smith CMA (AAMA), Dottie     Neuromuscular disorder [facility])      Essential Tremor   Overactive bladder               Past Surgical History:  Procedure Laterality Date   ABDOMINAL HYSTERECTOMY       COLONOSCOPY WITH PROPOFOL  N/A 09/04/2018    Procedure: COLONOSCOPY WITH PROPOFOL ;  Surgeon: Janalyn Keene NOVAK, MD;  Location: [facility] ENDOSCOPY;  Service: Endoscopy;  Laterality: N/A;   ESOPHAGOGASTRODUODENOSCOPY (EGD) WITH PROPOFOL  N/A 09/04/2018    Procedure: ESOPHAGOGASTRODUODENOSCOPY (EGD) WITH PROPOFOL ;  Surgeon: Janalyn Keene NOVAK, MD;  Location: [facility] ENDOSCOPY;  Service: Endoscopy;  Laterality: N/A;   LAPAROSCOPY        for endometriosis x2   MOUTH SURGERY   04/2023    tooth extraction, gum graft   TONSILLECTOMY       TOTAL HIP ARTHROPLASTY Right 05/15/2022    Procedure: RIGHT TOTAL HIP ARTHROPLASTY ANTERIOR APPROACH;  Surgeon: Vernetta Lonni GRADE, MD;  Location: [facility] OR;  Service: Orthopedics;  Laterality: Right;   TOTAL KNEE  ARTHROPLASTY Left 07/18/2021    Procedure: TOTAL KNEE ARTHROPLASTY;  Surgeon: Ernie Cough, MD;  Location: [facility] ORS;  Service: Orthopedics;  Laterality: Left;   TURBINATE REDUCTION            Social History         Socioeconomic History   Marital status: Widowed      Spouse name: Not on file   Number of children: Not on file   Years of education: Not on file   Highest education level: Bachelor's degree (e.g., BA, AB, BS)  Occupational History   Not on file  Tobacco Use   Smoking status: Never      Passive exposure: Never   Smokeless tobacco: Never  Vaping Use   Vaping status: Never Used  Substance and Sexual Activity   Alcohol use: Not Currently      Alcohol/week: 1.0 standard drink of alcohol      Types: 1 Glasses of wine per week   Drug use: No   Sexual activity: Not Currently  Other Topics Concern   Not on file  Social History Narrative   Not on file    Social Drivers of Health        Financial  Resource Strain: Low Risk  (05/02/2023)    Overall Financial Resource Strain (CARDIA)     Difficulty of Paying Living Expenses: Not hard at all  Food Insecurity: No Food Insecurity (05/02/2023)    Hunger Vital Sign     Worried About Running Out of Food in the Last Year: Never true     Ran Out of Food in the Last Year: Never true  Transportation Needs: No Transportation Needs (05/02/2023)    PRAPARE - Therapist, Art (Medical): No     Lack of Transportation (Non-Medical): No  Physical Activity: Inactive (05/02/2023)    Exercise Vital Sign     Days of Exercise per Week: 0 days     Minutes of Exercise per Session: 20 min  Stress: No Stress Concern Present (05/02/2023)    [employer] of Occupational Health - Occupational Stress Questionnaire     Feeling of Stress : Only a little  Social Connections: Moderately Integrated (05/02/2023)    Social Connection and Isolation Panel     Frequency of Communication with Friends and Family: More than three  times a week     Frequency of Social Gatherings with Friends and Family: More than three times a week     Attends Religious Services: More than 4 times per year     Active Member of [employer] or Organizations: Yes     Attends Engineer, Structural: More than 4 times per year     Marital Status: Divorced         Family History  Problem Relation Age of Onset   Alcohol abuse Mother     Arthritis Mother     Hyperlipidemia Mother     Heart disease Mother     Stroke Mother     Hypertension Mother     Depression Mother     Anxiety disorder Mother     Arthritis Father     Lung cancer Paternal Grandfather     Kidney disease Paternal Grandfather          Allergies       Allergies  Allergen Reactions   Penicillins Itching      Tolerated Cephalosporin 07/18/21.     Other reaction(s): wheezing   Amoxicillin Itching   Misc. Sulfonamide Containing Compounds     Rosanil Cleanser [Sulfacetamide Sodium-Sulfur] Other (See Comments)      Unknown reaction    Sulfonamide Derivatives        Unknown reaction    Ylang-Ylang [Cananga Oil (Ylang-Ylang)] Itching            Current Outpatient Medications  Medication Sig Dispense Refill   acetaminophen  (TYLENOL ) 500 MG tablet Take 1 tablet (500 mg total) by mouth every 8 (eight) hours for 10 days. 30 tablet 0   aspirin  EC 325 MG tablet Take 1 tablet (325 mg total) by mouth daily. 14 tablet 0   ibuprofen  (ADVIL ) 800 MG tablet Take 1 tablet (800 mg total) by mouth every 8 (eight) hours for 10 days. Please take with food, please alternate with acetaminophen  30 tablet 0   oxyCODONE  (ROXICODONE ) 5 MG immediate release tablet Take 1 tablet (5 mg total) by mouth every 4 (four) hours as needed for severe pain (pain score 7-10) or breakthrough pain. 10 tablet 0   Abatacept  (ORENCIA  IV) Inject into the vein.       acetaminophen  (TYLENOL ) 500 MG tablet Take 1,000 mg by mouth every 6 (six) hours as needed for moderate  pain.       b complex vitamins  capsule Take 1 capsule by mouth daily.       CALCIUM  PO Take 1,200 mg by mouth daily.       celecoxib  (CELEBREX ) 200 MG capsule Take 200 mg by mouth daily.       cycloSPORINE  (RESTASIS ) 0.05 % ophthalmic emulsion Place 1 drop into both eyes 2 (two) times daily.       Glucosamine HCl 1000 MG TABS Take 1,000 mg by mouth daily.       lidocaine  (LIDODERM ) 5 % Place 1 patch onto the skin daily.       Magnesium  100 MG TABS Take 100 mg by mouth 2 (two) times a week.       Multiple Vitamin (MULTIVITAMIN WITH MINERALS) TABS tablet Take 1 tablet by mouth daily.       oxyCODONE  (OXY IR/ROXICODONE ) 5 MG immediate release tablet TAKE 1 TABLET BY MOUTH FOUR TIMES DAILY AS NEEDED FOR SEVERE PAIN.       Probiotic Product (PROBIOTIC DAILY PO) Take 1 Dose by mouth daily.       trolamine salicylate (BLUE-EMU HEMP) 10 % cream Apply 1 Application topically as needed for muscle pain.       VAGIFEM 10 MCG TABS vaginal tablet         Vitamin D , Ergocalciferol , (DRISDOL) 1.25 MG (50000 UNIT) CAPS capsule Take 50,000 Units by mouth every 14 (fourteen) days.          No current facility-administered medications for this visit.      Imaging Results (Last 48 hours)  No results found.     Review of Systems:   A ROS was performed including pertinent positives and negatives as documented in the HPI.   Physical Exam :   Constitutional: NAD and appears stated age Neurological: Alert and oriented Psych: Appropriate affect and cooperative There were no vitals taken for this visit.    Comprehensive Musculoskeletal Exam:     Right shoulder with tenderness about the glenohumeral joint.  There is severe crepitus.  Active forward elevation is to approximately 20 degrees with external rotation at the side to neutral.  Internal rotation is to sacrum this neurosensory seems intact     Imaging:   Xray (3 views right shoulder): Severe glenohumeral osteoarthritis       I personally reviewed and interpreted the  radiographs.     Assessment and Plan:   74 y.o. female with right shoulder severe glenohumeral osteoarthritis.  Today's visit she has trialed both physical therapy and injection without relief.  Given this we did discuss treatment options.  I did discuss the possibility of reverse shoulder arthroplasty.  I discussed the risk limitations also associated recovery timeframe.  After discussion she would like to proceed with this - Plan for right shoulder reverse shoulder arthroplasty     After a lengthy discussion of treatment options, including risks, benefits, alternatives, complications of surgical and nonsurgical conservative options, the patient elected surgical repair.    The patient  is aware of the material risks  and complications including, but not limited to injury to adjacent structures, neurovascular injury, infection, numbness, bleeding, implant failure, thermal burns, stiffness, persistent pain, failure to heal, disease transmission from allograft, need for further surgery, dislocation, anesthetic risks, blood clots, risks of death,and others. The probabilities of surgical success and failure discussed with patient given their particular co-morbidities.The time and nature of expected rehabilitation and recovery was discussed.The patient's questions were all answered  preoperatively.  No barriers to understanding were noted. I explained the natural history of the disease process and Rx rationale.  I explained to the patient what I considered to be reasonable expectations given their personal situation.  The final treatment plan was arrived at through a shared patient decision making process model.        I personally saw and evaluated the patient, and participated in the management and treatment plan.   Elspeth Parker, MD Attending Physician, Orthopedic Surgery   This document was dictated using Dragon voice recognition software. A reasonable attempt at proof reading has been made to  minimize errors.        Electronically signed by Parker Elspeth, MD at 08/28/2023  8:34 PM

## 2024-02-17 NOTE — Discharge Instructions (Addendum)
 "    Discharge Instructions    Attending Surgeon: Elspeth Parker, MD Office Phone Number: [phone]   Diagnosis and Procedures:    Surgeries Performed: Right shoulder reverse shoulder arthroplasty   Discharge Plan:    Diet: Resume usual diet. Begin with light or bland foods.  Drink plenty of fluids.  Activity:  Keep sling and dressing in place until your follow up visit in Physical Therapy You are advised to go home directly from the hospital or surgical center. Restrict your activities.  GENERAL INSTRUCTIONS: 1.  Keep your surgical site elevated above your heart for at least 5-7 days or longer to prevent swelling. This will improve your comfort and your overall recovery following surgery.     2. Please call Dr. Danetta office at [phone] with questions Monday-Friday during business hours. If no one answers, please leave a message and someone should get back to the patient within 24 hours. For emergencies please call 911 or proceed to the emergency room.   3. Patient to notify surgical team if experiences any of the following: Bowel/Bladder dysfunction, uncontrolled pain, nerve/muscle weakness, incision with increased drainage or redness, nausea/vomiting and Fever greater than 101.0 F.  Be alert for signs of infection including redness, streaking, odor, fever or chills. Be alert for excessive pain or bleeding and notify your surgeon immediately.  WOUND INSTRUCTIONS:   Leave your dressing/cast/splint in place until your post operative visit.  Keep it clean and dry.  Always keep the incision clean and dry until the staples/sutures are removed. If there is no drainage from the incision you should keep it open to air. If there is drainage from the incision you must keep it covered at all times until the drainage stops  Do not soak in a bath tub, hot tub, pool, lake or other body of water  until 21 days after your surgery and your incision is completely dry and healed.  If  you have removable sutures (or staples) they must be removed 10-14 days (unless otherwise instructed) from the day of your surgery.     1)  Elevate the extremity as much as possible.  2)  Keep the dressing clean and dry.  3)  Please call us  if the dressing becomes wet or dirty.  4)  If you are experiencing worsening pain or worsening swelling, please call.     MEDICATIONS: Resume all previous home medications at the previous prescribed dose and frequency unless otherwise noted Start taking the  pain medications on an as-needed basis as prescribed  Please taper down pain medication over the next week following surgery.  Ideally you should not require a refill of any narcotic pain medication.  Take pain medication with food to minimize nausea. In addition to the prescribed pain medication, you may take over-the-counter pain relievers such as Tylenol .  Do NOT take additional tylenol  if your pain medication already has tylenol  in it. No Tylenol  until after 1:00pm. Aspirin  325mg  daily per bottle instructions. Narcotic Policy: Per [facility] clinic policy, our goal is ensure optimal postoperative pain control with a multimodal pain management strategy. For all [facility] patients, our goal is to wean post-operative narcotic medications by 6 weeks post-operatively, and many times sooner. If this is not possible due to utilization of pain medication prior to surgery, your [facility] doctor will support your acute post-operative pain control for the first 6 weeks postoperatively, with a plan to transition you back to your primary pain team following that. Maralee will work to ensure a  smooth handoff.      FOLLOWUP INSTRUCTIONS: 1. Follow up at the Physical Therapy Clinic 3-4 days following surgery. This appointment should be scheduled unless other arrangements have been made.The Physical Therapy scheduling number is [phone] if an appointment has not already been arranged.  2. Contact Dr. Danetta  office during office hours at [phone] or the practice after hours line at [phone] for non-emergencies. For medical emergencies call 911.   Discharge Location: Home    Post Anesthesia Home Care Instructions  Activity: Get plenty of rest for the remainder of the day. A responsible individual must stay with you for 24 hours following the procedure.  For the next 24 hours, DO NOT: -Drive a car -Advertising copywriter -Drink alcoholic beverages -Take any medication unless instructed by your physician -Make any legal decisions or sign important papers.  Meals: Start with liquid foods such as gelatin or soup. Progress to regular foods as tolerated. Avoid greasy, spicy, heavy foods. If nausea and/or vomiting occur, drink only clear liquids until the nausea and/or vomiting subsides. Call your physician if vomiting continues.  Special Instructions/Symptoms: Your throat may feel dry or sore from the anesthesia or the breathing tube placed in your throat during surgery. If this causes discomfort, gargle with warm salt water . The discomfort should disappear within 24 hours.  If you had a scopolamine patch placed behind your ear for the management of post- operative nausea and/or vomiting:  1. The medication in the patch is effective for 72 hours, after which it should be removed.  Wrap patch in a tissue and discard in the trash. Wash hands thoroughly with soap and water . 2. You may remove the patch earlier than 72 hours if you experience unpleasant side effects which may include dry mouth, dizziness or visual disturbances. 3. Avoid touching the patch. Wash your hands with soap and water  after contact with the patch.   Regional Anesthesia Blocks  1. You may not be able to move or feel the blocked extremity after a regional anesthetic block. This may last may last from 3-48 hours after placement, but it will go away. The length of time depends on the medication injected and your individual  response to the medication. As the nerves start to wake up, you may experience tingling as the movement and feeling returns to your extremity. If the numbness and inability to move your extremity has not gone away after 48 hours, please call your surgeon.   2. The extremity that is blocked will need to be protected until the numbness is gone and the strength has returned. Because you cannot feel it, you will need to take extra care to avoid injury. Because it may be weak, you may have difficulty moving it or using it. You may not know what position it is in without looking at it while the block is in effect.  3. For blocks in the legs and feet, returning to weight bearing and walking needs to be done carefully. You will need to wait until the numbness is entirely gone and the strength has returned. You should be able to move your leg and foot normally before you try and bear weight or walk. You will need someone to be with you when you first try to ensure you do not fall and possibly risk injury.  4. Bruising and tenderness at the needle site are common side effects and will resolve in a few days.  5. Persistent numbness or new problems with movement should be communicated  to the surgeon or the [facility] [phone] [facility] [phone]).Information for Discharge Teaching: EXPAREL  (bupivacaine  liposome injectable suspension)   Pain relief is important to your recovery. The goal is to control your pain so you can move easier and return to your normal activities as soon as possible after your procedure. Your physician may use several types of medicines to manage pain, swelling, and more.  Your surgeon or anesthesiologist gave you EXPAREL (bupivacaine ) to help control your pain after surgery.  EXPAREL  is a local anesthetic designed to release slowly over an extended period of time to provide pain relief by numbing the tissue around the surgical site. EXPAREL  is  designed to release pain medication over time and can control pain for up to 72 hours. Depending on how you respond to EXPAREL , you may require less pain medication during your recovery. EXPAREL  can help reduce or eliminate the need for opioids during the first few days after surgery when pain relief is needed the most. EXPAREL  is not an opioid and is not addictive. It does not cause sleepiness or sedation.   Important! A teal colored band has been placed on your arm with the date, time and amount of EXPAREL  you have received. Please leave this armband in place for the full 96 hours following administration, and then you may remove the band. If you return to the hospital for any reason within 96 hours following the administration of EXPAREL , the armband provides important information that your health care providers to know, and alerts them that you have received this anesthetic.    Possible side effects of EXPAREL : Temporary loss of sensation or ability to move in the area where medication was injected. Nausea, vomiting, constipation Rarely, numbness and tingling in your mouth or lips, lightheadedness, or anxiety may occur. Call your doctor right away if you think you may be experiencing any of these sensations, or if you have other questions regarding possible side effects.  Follow all other discharge instructions given to you by your surgeon or nurse. Eat a healthy diet and drink plenty of water  or other fluids. "

## 2024-02-17 NOTE — Transfer of Care (Signed)
 Immediate Anesthesia Transfer of Care Note  Patient: Wanda Lang  Procedure(s) Performed: ARTHROPLASTY, SHOULDER, TOTAL, REVERSE RIGHT WITH steroid injection Left shoulder (Right: Shoulder)  Patient Location: PACU  Anesthesia Type:General  Level of Consciousness: awake, alert , oriented, and patient cooperative  Airway & Oxygen Therapy: Patient Spontanous Breathing and Patient connected to face mask oxygen  Post-op Assessment: Report given to RN and Post -op Vital signs reviewed and stable  Post vital signs: Reviewed and stable  Last Vitals:  Vitals Value Taken Time  BP 139/67 02/17/24 09:31  Temp 36.6 C 02/17/24 09:31  Pulse 80 02/17/24 09:35  Resp 18 02/17/24 09:35  SpO2 99 % 02/17/24 09:35  Vitals shown include unfiled device data.  Last Pain:  Vitals:   02/17/24 0639  TempSrc: Temporal  PainSc: 0-No pain         Complications: No notable events documented.

## 2024-02-18 ENCOUNTER — Encounter (HOSPITAL_BASED_OUTPATIENT_CLINIC_OR_DEPARTMENT_OTHER): Payer: Self-pay | Admitting: Orthopaedic Surgery

## 2024-02-19 ENCOUNTER — Ambulatory Visit

## 2024-02-19 ENCOUNTER — Ambulatory Visit: Attending: Orthopaedic Surgery

## 2024-02-19 DIAGNOSIS — M25511 Pain in right shoulder: Secondary | ICD-10-CM | POA: Insufficient documentation

## 2024-02-19 DIAGNOSIS — M6281 Muscle weakness (generalized): Secondary | ICD-10-CM | POA: Insufficient documentation

## 2024-02-19 DIAGNOSIS — M19011 Primary osteoarthritis, right shoulder: Secondary | ICD-10-CM | POA: Insufficient documentation

## 2024-02-19 DIAGNOSIS — M25611 Stiffness of right shoulder, not elsewhere classified: Secondary | ICD-10-CM | POA: Diagnosis present

## 2024-02-25 ENCOUNTER — Ambulatory Visit

## 2024-02-25 DIAGNOSIS — M25611 Stiffness of right shoulder, not elsewhere classified: Secondary | ICD-10-CM | POA: Diagnosis not present

## 2024-02-25 DIAGNOSIS — M25511 Pain in right shoulder: Secondary | ICD-10-CM

## 2024-02-25 DIAGNOSIS — M6281 Muscle weakness (generalized): Secondary | ICD-10-CM

## 2024-02-25 NOTE — Therapy (Signed)
 " OUTPATIENT PHYSICAL THERAPY SHOULDER/ELBOW TREATMENT  Patient Name: Wanda Lang MRN: [id number] DOB:02/02/1951, 74 y.o., female Today's Date: 02/25/2024  END OF SESSION:  PT End of Session - 02/25/24 1349     Visit Number 2    Number of Visits 25    Date for Recertification  05/13/24    PT Start Time 1350    PT Stop Time 1430    PT Time Calculation (min) 40 min    Equipment Utilized During Treatment Other (comment)   Shoulder Immobilizer   Activity Tolerance Patient tolerated treatment well    Behavior During Therapy WFL for tasks assessed/performed           Past Medical History:  Diagnosis Date   Allergy    Arthritis    Coronary artery disease    Fatty liver    GERD (gastroesophageal reflux disease)    Heart murmur    Hypertension    Hx   LIVER FUNCTION TESTS, ABNORMAL, HX OF 09/08/2007   Qualifier: Diagnosis of  By: Nelson-Smith CMA (AAMA), Dottie     Neuromuscular disorder [facility])    Essential Tremor   Overactive bladder    Past Surgical History:  Procedure Laterality Date   ABDOMINAL HYSTERECTOMY     CARDIAC VALVE REPLACEMENT  11/13/2023   COLONOSCOPY WITH PROPOFOL  N/A 09/04/2018   Procedure: COLONOSCOPY WITH PROPOFOL ;  Surgeon: Janalyn Keene NOVAK, MD;  Location: [facility];  Service: Endoscopy;  Laterality: N/A;   ESOPHAGOGASTRODUODENOSCOPY (EGD) WITH PROPOFOL  N/A 09/04/2018   Procedure: ESOPHAGOGASTRODUODENOSCOPY (EGD) WITH PROPOFOL ;  Surgeon: Janalyn Keene NOVAK, MD;  Location: [facility];  Service: Endoscopy;  Laterality: N/A;   LAPAROSCOPY     for endometriosis x2   MOUTH SURGERY  04/2023   tooth extraction, gum graft   REVERSE SHOULDER ARTHROPLASTY Right 02/17/2024   Procedure: ARTHROPLASTY, SHOULDER, TOTAL, REVERSE RIGHT WITH steroid injection Left shoulder;  Surgeon: Genelle Standing, MD;  Location: [facility];  Service: Orthopedics;  Laterality: Right;   RIGHT/LEFT HEART CATH AND CORONARY ANGIOGRAPHY Bilateral 09/19/2023    Procedure: RIGHT/LEFT HEART CATH AND CORONARY ANGIOGRAPHY;  Surgeon: Ammon Blunt, MD;  Location: [facility];  Service: Cardiovascular;  Laterality: Bilateral;   TONSILLECTOMY     TOTAL HIP ARTHROPLASTY Right 05/15/2022   Procedure: RIGHT TOTAL HIP ARTHROPLASTY ANTERIOR APPROACH;  Surgeon: Vernetta Lonni GRADE, MD;  Location: [facility];  Service: Orthopedics;  Laterality: Right;   TOTAL KNEE ARTHROPLASTY Left 07/18/2021   Procedure: TOTAL KNEE ARTHROPLASTY;  Surgeon: Ernie Cough, MD;  Location: [facility];  Service: Orthopedics;  Laterality: Left;   TURBINATE REDUCTION     Patient Active Problem List   Diagnosis Date Noted   Osteoarthritis of right shoulder 02/17/2024   Primary osteoarthritis, left shoulder 02/17/2024   S/P TAVR (transcatheter aortic valve replacement) 12/05/2023   Skin wound from surgical incision 12/05/2023   Elevated LFTs 10/10/2023   Protracted URI 05/03/2023   Acute non-recurrent pansinusitis 02/04/2023   OSA (obstructive sleep apnea) 11/14/2022   Arthritis 11/14/2022   Nocturia 10/05/2022   Hypersomnia 10/05/2022   S/P total hip arthroplasty 05/17/2022   Status post total replacement of right hip 05/15/2022   Overactive bladder 04/04/2022   Postmenopausal estrogen deficiency 11/13/2021   Primary osteoarthritis 11/13/2021   Leg length inequality 11/06/2021   Impaired ambulation 10/23/2021   Contact dermatitis 10/16/2021   SI joint arthritis 08/24/2021   Spinal stenosis, lumbar region, without neurogenic claudication 08/24/2021   Degeneration of lumbar intervertebral disc 07/24/2021  S/P total knee arthroplasty, left 07/18/2021   Preop examination 07/07/2021   Right hip pain 07/07/2021   Low back pain 07/07/2021   Drug-induced immunodeficiency 06/12/2021   Impingement syndrome of right shoulder region 06/12/2021   COVID-19 01/24/2021   Heart murmur 11/24/2020   Hammer toe 04/22/2020   Metatarsalgia of left foot 04/22/2020    Osteoarthritis of midfoot 04/22/2020   Duodenal ulcer 11/12/2019   Stress 11/12/2019   Morbid obesity (HCC) 11/06/2019   Pain in left knee 07/30/2019   Aortic stenosis, moderate 02/06/2018   Chronic diarrhea 10/09/2017   Acid reflux 07/15/2017   Asymptomatic carotid artery stenosis 07/15/2017   Cyst of skin 07/15/2017   Right maxillary sinusitis 11/09/2016   Immunocompromised 11/09/2016   Allergic rhinitis 11/02/2016   Benign essential tremor 11/02/2016   History of hypertension 11/02/2016   Postmenopausal symptoms 07/01/2015   Fatty liver 09/08/2007   Rheumatoid arthritis (HCC) 09/08/2007    PCP: Gretel App, NP    REFERRING PROVIDER: Genelle Standing, MD  REFERRING DIAG:  M19.011 (ICD-10-CM) - Osteoarthritis of right shoulder, unspecified osteoarthritis type      RATIONALE FOR EVALUATION AND TREATMENT: Rehabilitation  THERAPY DIAG: Stiffness of right shoulder, not elsewhere classified  Right shoulder pain, unspecified chronicity  Muscle weakness (generalized)  ONSET DATE: Date of Surgery: 02/17/2024  FOLLOW-UP APPT SCHEDULED WITH REFERRING PROVIDER: Yes    SUBJECTIVE:                                                                                                                                                                                         SUBJECTIVE STATEMENT:    Patient arrives to OPPT with a chief concern of R shoulder pain and stiffness s/p Reverse Total Shoulder Arthroplasty on 02/17/2024.   PERTINENT HISTORY:   [name] is a 74 y.o. female presenting to OPPT s/p R RTSA with biceps tenodesis on 02/17/2024. Since the surgery the patient has rested with R Shoulder in shoulder immobilizer. She reports that she has been going well. She reports that her sister is taking care of her for the next few days. She reports that she's been actively moving her elbow and wrist joint however she has questions about moving the shoulder joint. Patient reports  that moved her arm passively through external rotation but she knows not to reach behind her back.   PMH significant for: Hx of Surgery: R THA 04/24; L TKA 06/23  Dominant hand: right  Imaging: Yes   Red flags (chills/fever, night sweats, nausea, vomiting, unrelenting pain, unexplained weight gain/loss): Negative.  PAIN:  Pain Intensity: Present: 2/10, Best: 0/10, Worst: 2/10 (Nerve Block)  Pain location: Incisional Site  Pain Quality: constant and aching itches Radiating: No  Numbness/Tingling: No  She believes the nerve block is still in the system  Aggravating factors: Haven't moved shoulder into any positions of provocation  Relieving factors: Veterinary Surgeon and Rest   PRECAUTIONS: Shoulder  WEIGHT BEARING RESTRICTIONS: Yes Avoid WBing  FALLS: Has patient fallen in last 6 months? No  Living Environment Lives with: lives alone Lives in: House/apartment 2-Story  Stairs: Yes: Internal: 13-15 steps; can reach both Has following equipment at home: Chair lift on stairs; walking stick, FWW  Prior level of function: Independent  Occupational demands: Retired   Patient Goals: Patient would like to dress comfortably and use her arm like normal     OBJECTIVE:   Patient Surveys   QuickDASH: QUICK DASH  Please rate your ability do the following activities in the last week by selecting the number below the appropriate response.   Activities Rating  Open a tight or new jar.  5 = Unable  Do heavy household chores (e.g., wash walls, floors). 5 = Unable  Carry a shopping bag or briefcase 5 = Unable  Wash your back. 5 = Unable  Use a knife to cut food. 4 = Severe difficulty  Recreational activities in which you take some force or impact through your arm, shoulder or hand (e.g., golf, hammering, tennis, etc.). 5 = Unable  During the past week, to what extent has your arm, shoulder or hand problem interfered with your normal social activities with family, friends,  neighbors or groups?  5 = Extremely  During the past week, were you limited in your work or other regular daily activities as a result of your arm, shoulder or hand problem? 5 = Unable  Rate the severity of the following symptoms in the last week: Arm, Shoulder, or hand pain. 2 = Mild  Rate the severity of the following symptoms in the last week: Tingling (pins and needles) in your arm, shoulder or hand. 2 = Mild  During the past week, how much difficulty have you had sleeping because of the pain in your arm, shoulder or hand?  2 = Mild difficulty   (A QuickDASH score may not be calculated if there is greater than 1 missing item.)  Quick Dash Disability/Symptom Score: 77.3 / 100 = 77.3 % (0/100 = no disability)  Minimally Clinically Important Difference (MCID): 15-20 points  (Franchignoni, F. et al. (2013). Minimally clinically important difference of the disabilities of the arm, shoulder, and hand outcome measures (DASH) and its shortened version (Quick DASH). Journal of Orthopaedic & Sports Physical Therapy, 44(1), 30-39)   Cognition Overall cognitive status: WFL for tasks assessed.     Gross Musculoskeletal Assessment Tremor: None Bulk: Normal Tone: Normal  Gait   Posture   Cervical Screen AROM: WFL and painless with overpressure in all planes   AROM AROM (Normal range in degrees) AROM  Cervical  Flexion (50) WFL  Extension (80) WFL  Right lateral flexion (45) WFL  Left lateral flexion (45) WFL  Right rotation (85) WFL  Left rotation (85) WFL   Right Left  Shoulder    Flexion  160  Extension    Abduction  140  External Rotation  75  Internal Rotation    Hands Behind Head    Hands Behind Back        Elbow    Flexion WNL WNL  Extension WNL WNL  Pronation    Supination    (* =  pain; Blank rows = not tested)  UE MMT: MMT (out of 5) Right Left   Cervical (isometric)  Flexion WNL  Extension WNL  Lateral Flexion WNL WNL  Rotation WNL WNL      Shoulder    Flexion  4  Extension    Abduction  4  External rotation  4-  Internal rotation  4-  Horizontal abduction    Horizontal adduction    Lower Trapezius    Rhomboids        Elbow  Flexion  4  Extension  4  Pronation    Supination        (* = pain; Blank rows = not tested)  Sensation Grossly intact to light touch bilateral UE as determined by testing dermatomes C2-T2. Proprioception and hot/cold testing deferred on this date.  Reflexes Deferred  Palpation Location LEFT  RIGHT           Subocciptials    Cervical paraspinals  0  Upper Trapezius  1  Levator Scapulae    Rhomboid Major/Minor    Sternoclavicular joint    Acromioclavicular joint    Coracoid process    Long head of biceps    Supraspinatus  0  Infraspinatus  0  Subscapularis    Teres Minor    Teres Major    Pectoralis Major    Pectoralis Minor    Anterior Deltoid  1  Lateral Deltoid  1  Posterior Deltoid    Latissimus Dorsi    Sternocleidomastoid    (Blank rows = not tested) Graded on 0-4 scale (0 = no pain, 1 = pain, 2 = pain with wincing/grimacing/flinching, 3 = pain with withdrawal, 4 = unwilling to allow palpation), (Blank rows = not tested)   Passive Accessory Intervertebral Motion Deferred  Accessory Motions/Glides Glenohumeral: Deferred  Posterior:  Inferior:  Anterior:   Information including subjective and objective data above this line was taken at the initial evaluation. ______________________________________________________________________________________________________  TODAY'S TREATMENT: DATE: 02/25/24  Subjective: Patient reports soreness in the R shoulder following last PT session. She reports that a 7/10 is the worst pain she's felt since the nerve block has weaned off.  Currently rates R shoulder pain as a 2/10 NPS. No further questions or concerns.   Therapeutic Exercise:  PROM Shoulder Flexion/Abduction   20x ea dir - able to achieve 100 deg ea direction, pain at end  range  Elbow Flexion against dumbbell   R: 3 x 10 -2# DB   Elbow Extension Pull Down   R: 3 x 10 - YTB   L: 3 x 10 - Green TB  Seated R Supination/Pronation  3 x 10 - 4# DB   Supine R Shoulder ER   Passive: 1 x 15   AAROM - with Cane: 1 x 10    Therapeutic Activity (with the intent of improving OH reaching):  AAROM Shoulder Flexion with Pulley   20x   AAROM Shoulder Abduction with Pulley   20x  AROM Shoulder Flexion against stair Rail   2 x 10   AROM Shoulder Abduction against stair rail   2 x 10    PATIENT EDUCATION:  Education details: Exercise Technique  Person educated: Patient Education method: Programmer, Multimedia, Facilities Manager, Actor cues, and Handouts Education comprehension: verbalized understanding, returned demonstration, and needs further education   HOME EXERCISE PROGRAM:  Access Code: [email or web address] URL: [email or web address] Date: 02/25/2024 Prepared by: Lonni Pall  Exercises - Seated Shoulder Flexion Towel Slide at Table Top  - 2 x  daily - 7 x weekly - 2-3 sets - 10 reps - Seated Shoulder Abduction Towel Slide at Table Top  - 2 x daily - 7 x weekly - 2-3 sets - 10 reps - Seated Shoulder Pendulum Exercise  - 2 x daily - 7 x weekly - 2-3 sets - 10 reps - Forearm Pronation and Supination With Shoulder Sling  - 2 x daily - 7 x weekly - 3 sets - 10 reps - Closing and Opening Hand With Shoulder Sling  - 2 x daily - 7 x weekly - 3 sets - 10 reps - Shoulder Rolls in Sitting  - 2 x daily - 7 x weekly - 3 sets - 10 reps - Seated Shoulder Flexion AAROM with Pulley Behind  - 2 x daily - 7 x weekly - 2-3 sets - 10-20 reps - Seated Shoulder Abduction AAROM with Pulley Behind  - 2 x daily - 7 x weekly - 2-3 sets - 10-20 reps - Seated Shoulder External Rotation AAROM with Cane and Hand in Neutral  - 2 x daily - 7 x weekly - 2 sets - 10-12 reps  ASSESSMENT:  CLINICAL IMPRESSION: Patient reports to OPPT for continued treatment in management s/p Reverse  Total Shoulder Arthroplasty 02/17/2024. She is currently 1 week post op and demonstrates improvements in ROM as expected. She tolerated all PT interventions focused on progressing PROM > AAROM > AROM for upward reaching. Reviewed HEP exercises with good return demonstration. Patient making steady progress with ROM as expected however the nerve block has worn off and patient is starting to experiencing increased pain around the incision site. Small discussion on proper ice schedule to manage pain. Patient's deficits are limiting her full participation with independent ADLs and recreational tasks. Based on today's performance, pt will continue to benefit from skilled PT in order to facilitate return to PLOF and improve QoL.   OBJECTIVE IMPAIRMENTS: decreased activity tolerance, decreased ROM, decreased strength, increased edema, increased muscle spasms, impaired sensation, and pain.   ACTIVITY LIMITATIONS: carrying, lifting, transfers, bathing, dressing, reach over head, and hygiene/grooming  PARTICIPATION LIMITATIONS: driving, community activity, and church  PERSONAL FACTORS: Age, Past/current experiences, Social background, Time since onset of injury/illness/exacerbation, and 3+ comorbidities: S/P TAVR (transcatheter aortic valve replacement), 1 OA in LUE, Hx of prev surgeries (THA, TKA SEE ABOVE) are also affecting patient's functional outcome.   REHAB POTENTIAL: Good  CLINICAL DECISION MAKING: Evolving/moderate complexity  EVALUATION COMPLEXITY: Moderate   GOALS: Goals reviewed with patient? Yes  SHORT TERM GOALS: Target date: 03/24/2024  Pt will be independent with HEP to improve strength and decrease shoulder pain to improve pain-free function at home and work. Baseline: Initial provided Goal status: INITIAL   LONG TERM GOALS: Target date: 04/21/2024  Pt will increase L shoulder AROM flexion/abduction to 130 deg without pain in order to demonstrate improvements for OH reaching and MD  protocol.  Baseline: < 30 deg AROM flex/abd Goal status: INITIAL  2.  Pt will decrease worst shoulder pain by at least 3 points on the NPRS in order to demonstrate clinically significant reduction in shoulder pain. Baseline: 7/10  Goal status: INITIAL  3.  Pt will decrease quick DASH score by at least 8% in order to demonstrate clinically significant reduction in disability related to shoulder pain        Baseline: 77.3 / 100 = 77.3 %  Goal status: INITIAL  4. Pt will increase strength in shoulder flexion and abduction by at least 1/2 MMT grade  in order to demonstrate improvement in strength and function         Baseline: Not Tested  Goal status: INITIAL  PLAN:  PT FREQUENCY: 1-2x/week  PT DURATION: 12 weeks  PLANNED INTERVENTIONS: [id number]- PT Re-evaluation, 97750- Physical Performance Testing, 97110-Therapeutic exercises, 97530- Therapeutic activity, [id number]- Neuromuscular re-education, 97535- Self Care, [id number]- Manual therapy, 97760- Orthotic Initial, [phone]- Orthotic/Prosthetic subsequent, Patient/Family education, Joint mobilization, Scar mobilization, Cryotherapy, and Moist heat  PLAN FOR NEXT SESSION: Review HEP, initiate PROM > AAROM > AROM exercises for R GHJ, Scapular exercises, elbow AROM and wrist AROM   Lonni Pall PT, DPT Physical Therapist- [place]  02/25/2024, 1:50 PM  "

## 2024-02-27 ENCOUNTER — Ambulatory Visit

## 2024-02-27 DIAGNOSIS — M6281 Muscle weakness (generalized): Secondary | ICD-10-CM

## 2024-02-27 DIAGNOSIS — M25611 Stiffness of right shoulder, not elsewhere classified: Secondary | ICD-10-CM

## 2024-02-27 DIAGNOSIS — M25511 Pain in right shoulder: Secondary | ICD-10-CM

## 2024-02-27 NOTE — Therapy (Signed)
 " OUTPATIENT PHYSICAL THERAPY SHOULDER/ELBOW TREATMENT  Patient Name: Wanda Lang MRN: [id number] DOB:1950-04-05, 74 y.o., female Today's Date: 02/27/2024  END OF SESSION:  PT End of Session - 02/27/24 1447     Visit Number 3    Number of Visits 25    Date for Recertification  05/13/24    Authorization Type Auth 25 PT visits    Authorization #[id number]    Authorization Time Period 1/4 - 4/8    Authorization - Number of Visits 25    PT Start Time 1448    PT Stop Time 1530    PT Time Calculation (min) 42 min    Equipment Utilized During Treatment Other (comment)   Shoulder Immobilizer   Activity Tolerance Patient tolerated treatment well    Behavior During Therapy WFL for tasks assessed/performed           Past Medical History:  Diagnosis Date   Allergy    Arthritis    Coronary artery disease    Fatty liver    GERD (gastroesophageal reflux disease)    Heart murmur    Hypertension    Hx   LIVER FUNCTION TESTS, ABNORMAL, HX OF 09/08/2007   Qualifier: Diagnosis of  By: Nelson-Smith CMA (AAMA), Dottie     Neuromuscular disorder [facility])    Essential Tremor   Overactive bladder    Past Surgical History:  Procedure Laterality Date   ABDOMINAL HYSTERECTOMY     CARDIAC VALVE REPLACEMENT  11/13/2023   COLONOSCOPY WITH PROPOFOL  N/A 09/04/2018   Procedure: COLONOSCOPY WITH PROPOFOL ;  Surgeon: Janalyn Keene NOVAK, MD;  Location: [facility];  Service: Endoscopy;  Laterality: N/A;   ESOPHAGOGASTRODUODENOSCOPY (EGD) WITH PROPOFOL  N/A 09/04/2018   Procedure: ESOPHAGOGASTRODUODENOSCOPY (EGD) WITH PROPOFOL ;  Surgeon: Janalyn Keene NOVAK, MD;  Location: [facility];  Service: Endoscopy;  Laterality: N/A;   LAPAROSCOPY     for endometriosis x2   MOUTH SURGERY  04/2023   tooth extraction, gum graft   REVERSE SHOULDER ARTHROPLASTY Right 02/17/2024   Procedure: ARTHROPLASTY, SHOULDER, TOTAL, REVERSE RIGHT WITH steroid injection Left shoulder;  Surgeon: Genelle Standing, MD;   Location: [facility];  Service: Orthopedics;  Laterality: Right;   RIGHT/LEFT HEART CATH AND CORONARY ANGIOGRAPHY Bilateral 09/19/2023   Procedure: RIGHT/LEFT HEART CATH AND CORONARY ANGIOGRAPHY;  Surgeon: Ammon Blunt, MD;  Location: [facility];  Service: Cardiovascular;  Laterality: Bilateral;   TONSILLECTOMY     TOTAL HIP ARTHROPLASTY Right 05/15/2022   Procedure: RIGHT TOTAL HIP ARTHROPLASTY ANTERIOR APPROACH;  Surgeon: Vernetta Lonni GRADE, MD;  Location: [facility];  Service: Orthopedics;  Laterality: Right;   TOTAL KNEE ARTHROPLASTY Left 07/18/2021   Procedure: TOTAL KNEE ARTHROPLASTY;  Surgeon: Ernie Cough, MD;  Location: [facility];  Service: Orthopedics;  Laterality: Left;   TURBINATE REDUCTION     Patient Active Problem List   Diagnosis Date Noted   Osteoarthritis of right shoulder 02/17/2024   Primary osteoarthritis, left shoulder 02/17/2024   S/P TAVR (transcatheter aortic valve replacement) 12/05/2023   Skin wound from surgical incision 12/05/2023   Elevated LFTs 10/10/2023   Protracted URI 05/03/2023   Acute non-recurrent pansinusitis 02/04/2023   OSA (obstructive sleep apnea) 11/14/2022   Arthritis 11/14/2022   Nocturia 10/05/2022   Hypersomnia 10/05/2022   S/P total hip arthroplasty 05/17/2022   Status post total replacement of right hip 05/15/2022   Overactive bladder 04/04/2022   Postmenopausal estrogen deficiency 11/13/2021   Primary osteoarthritis 11/13/2021   Leg length inequality 11/06/2021   Impaired  ambulation 10/23/2021   Contact dermatitis 10/16/2021   SI joint arthritis 08/24/2021   Spinal stenosis, lumbar region, without neurogenic claudication 08/24/2021   Degeneration of lumbar intervertebral disc 07/24/2021   S/P total knee arthroplasty, left 07/18/2021   Preop examination 07/07/2021   Right hip pain 07/07/2021   Low back pain 07/07/2021   Drug-induced immunodeficiency 06/12/2021   Impingement syndrome of right  shoulder region 06/12/2021   COVID-19 01/24/2021   Heart murmur 11/24/2020   Hammer toe 04/22/2020   Metatarsalgia of left foot 04/22/2020   Osteoarthritis of midfoot 04/22/2020   Duodenal ulcer 11/12/2019   Stress 11/12/2019   Morbid obesity (HCC) 11/06/2019   Pain in left knee 07/30/2019   Aortic stenosis, moderate 02/06/2018   Chronic diarrhea 10/09/2017   Acid reflux 07/15/2017   Asymptomatic carotid artery stenosis 07/15/2017   Cyst of skin 07/15/2017   Right maxillary sinusitis 11/09/2016   Immunocompromised 11/09/2016   Allergic rhinitis 11/02/2016   Benign essential tremor 11/02/2016   History of hypertension 11/02/2016   Postmenopausal symptoms 07/01/2015   Fatty liver 09/08/2007   Rheumatoid arthritis (HCC) 09/08/2007    PCP: Gretel App, NP    REFERRING PROVIDER: Genelle Standing, MD  REFERRING DIAG:  M19.011 (ICD-10-CM) - Osteoarthritis of right shoulder, unspecified osteoarthritis type      RATIONALE FOR EVALUATION AND TREATMENT: Rehabilitation  THERAPY DIAG: Stiffness of right shoulder, not elsewhere classified  Right shoulder pain, unspecified chronicity  Muscle weakness (generalized)  ONSET DATE: Date of Surgery: 02/17/2024  FOLLOW-UP APPT SCHEDULED WITH REFERRING PROVIDER: Yes    SUBJECTIVE:                                                                                                                                                                                         SUBJECTIVE STATEMENT:    Patient arrives to OPPT with a chief concern of R shoulder pain and stiffness s/p Reverse Total Shoulder Arthroplasty on 02/17/2024.   PERTINENT HISTORY:   [name] is a 74 y.o. female presenting to OPPT s/p R RTSA with biceps tenodesis on 02/17/2024. Since the surgery the patient has rested with R Shoulder in shoulder immobilizer. She reports that she has been going well. She reports that her sister is taking care of her for the next few days.  She reports that she's been actively moving her elbow and wrist joint however she has questions about moving the shoulder joint. Patient reports that moved her arm passively through external rotation but she knows not to reach behind her back.   PMH significant for: Hx of Surgery: R THA 04/24; L TKA 06/23  Dominant  hand: right  Imaging: Yes   Red flags (chills/fever, night sweats, nausea, vomiting, unrelenting pain, unexplained weight gain/loss): Negative.  PAIN:  Pain Intensity: Present: 2/10, Best: 0/10, Worst: 2/10 (Nerve Block) Pain location: Incisional Site  Pain Quality: constant and aching itches Radiating: No  Numbness/Tingling: No  She believes the nerve block is still in the system  Aggravating factors: Haven't moved shoulder into any positions of provocation  Relieving factors: Veterinary Surgeon and Rest   PRECAUTIONS: Shoulder  WEIGHT BEARING RESTRICTIONS: Yes Avoid WBing  FALLS: Has patient fallen in last 6 months? No  Living Environment Lives with: lives alone Lives in: House/apartment 2-Story  Stairs: Yes: Internal: 13-15 steps; can reach both Has following equipment at home: Chair lift on stairs; walking stick, FWW  Prior level of function: Independent  Occupational demands: Retired   Patient Goals: Patient would like to dress comfortably and use her arm like normal     OBJECTIVE:   Patient Surveys   QuickDASH: QUICK DASH  Please rate your ability do the following activities in the last week by selecting the number below the appropriate response.   Activities Rating  Open a tight or new jar.  5 = Unable  Do heavy household chores (e.g., wash walls, floors). 5 = Unable  Carry a shopping bag or briefcase 5 = Unable  Wash your back. 5 = Unable  Use a knife to cut food. 4 = Severe difficulty  Recreational activities in which you take some force or impact through your arm, shoulder or hand (e.g., golf, hammering, tennis, etc.). 5 = Unable  During  the past week, to what extent has your arm, shoulder or hand problem interfered with your normal social activities with family, friends, neighbors or groups?  5 = Extremely  During the past week, were you limited in your work or other regular daily activities as a result of your arm, shoulder or hand problem? 5 = Unable  Rate the severity of the following symptoms in the last week: Arm, Shoulder, or hand pain. 2 = Mild  Rate the severity of the following symptoms in the last week: Tingling (pins and needles) in your arm, shoulder or hand. 2 = Mild  During the past week, how much difficulty have you had sleeping because of the pain in your arm, shoulder or hand?  2 = Mild difficulty   (A QuickDASH score may not be calculated if there is greater than 1 missing item.)  Quick Dash Disability/Symptom Score: 77.3 / 100 = 77.3 % (0/100 = no disability)  Minimally Clinically Important Difference (MCID): 15-20 points  (Franchignoni, F. et al. (2013). Minimally clinically important difference of the disabilities of the arm, shoulder, and hand outcome measures (DASH) and its shortened version (Quick DASH). Journal of Orthopaedic & Sports Physical Therapy, 44(1), 30-39)   Cognition Overall cognitive status: WFL for tasks assessed.     Gross Musculoskeletal Assessment Tremor: None Bulk: Normal Tone: Normal  Gait   Posture   Cervical Screen AROM: WFL and painless with overpressure in all planes   AROM AROM (Normal range in degrees) AROM  Cervical  Flexion (50) WFL  Extension (80) WFL  Right lateral flexion (45) WFL  Left lateral flexion (45) WFL  Right rotation (85) WFL  Left rotation (85) WFL   Right Left  Shoulder    Flexion  160  Extension    Abduction  140  External Rotation  75  Internal Rotation    Hands Behind Head  Hands Behind Back        Elbow    Flexion WNL WNL  Extension WNL WNL  Pronation    Supination    (* = pain; Blank rows = not tested)  UE MMT: MMT  (out of 5) Right Left   Cervical (isometric)  Flexion WNL  Extension WNL  Lateral Flexion WNL WNL  Rotation WNL WNL      Shoulder   Flexion  4  Extension    Abduction  4  External rotation  4-  Internal rotation  4-  Horizontal abduction    Horizontal adduction    Lower Trapezius    Rhomboids        Elbow  Flexion  4  Extension  4  Pronation    Supination        (* = pain; Blank rows = not tested)  Sensation Grossly intact to light touch bilateral UE as determined by testing dermatomes C2-T2. Proprioception and hot/cold testing deferred on this date.  Reflexes Deferred  Palpation Location LEFT  RIGHT           Subocciptials    Cervical paraspinals  0  Upper Trapezius  1  Levator Scapulae    Rhomboid Major/Minor    Sternoclavicular joint    Acromioclavicular joint    Coracoid process    Long head of biceps    Supraspinatus  0  Infraspinatus  0  Subscapularis    Teres Minor    Teres Major    Pectoralis Major    Pectoralis Minor    Anterior Deltoid  1  Lateral Deltoid  1  Posterior Deltoid    Latissimus Dorsi    Sternocleidomastoid    (Blank rows = not tested) Graded on 0-4 scale (0 = no pain, 1 = pain, 2 = pain with wincing/grimacing/flinching, 3 = pain with withdrawal, 4 = unwilling to allow palpation), (Blank rows = not tested)   Passive Accessory Intervertebral Motion Deferred  Accessory Motions/Glides Glenohumeral: Deferred  Posterior:  Inferior:  Anterior:   Information including subjective and objective data above this line was taken at the initial evaluation. ______________________________________________________________________________________________________  TODAY'S TREATMENT: DATE: 02/27/24  Subjective: Patient reported that she has her shoulder pain hasn't bothered her since this morning. No further questions or concerns.   Therapeutic Exercise:  PROM Shoulder Flexion/Abduction   20x ea dir - able to achieve 100 deg ea  direction, pain at end range  PROM Shoulder ER   1 x 15 - pain at 20 deg of end range   Wrist Supination/Pronation  3 x 10 - 3# DB   Bicep Curl   R 3 x 10 - 3# DB  Tricep Push   3 x 10  - Red TB    Shoulder Row   3 x 12 - Green TB   Serratus Punches   R 2 x 10 - VC for increased protractoin   Therapeutic Activity (with the intent of improving OH reaching):  AAROM R Shoulder Abd with pulley   1 x 20 reps  AAROM R Shoulder Flexion    x 20 with PVC   AROM Finger Climbing on Wall   R 1 x 10 reps  AAROM Shoulder ER - Arm at neutral   R 2 x 10 - with PVC   Shoulder Stabilization at 90 deg of flexion - Supine   R 3 x 15s - perutbations at wrist    PATIENT EDUCATION:  Education details: Exercise Technique  Person  educated: Patient Education method: Explanation, Demonstration, Tactile cues, and Handouts Education comprehension: verbalized understanding, returned demonstration, and needs further education   HOME EXERCISE PROGRAM:  Access Code: [email or web address] URL: [email or web address] Date: 02/25/2024 Prepared by: Lonni Norah Devin  Exercises - Seated Shoulder Flexion Towel Slide at Table Top  - 2 x daily - 7 x weekly - 2-3 sets - 10 reps - Seated Shoulder Abduction Towel Slide at Table Top  - 2 x daily - 7 x weekly - 2-3 sets - 10 reps - Seated Shoulder Pendulum Exercise  - 2 x daily - 7 x weekly - 2-3 sets - 10 reps - Forearm Pronation and Supination With Shoulder Sling  - 2 x daily - 7 x weekly - 3 sets - 10 reps - Closing and Opening Hand With Shoulder Sling  - 2 x daily - 7 x weekly - 3 sets - 10 reps - Shoulder Rolls in Sitting  - 2 x daily - 7 x weekly - 3 sets - 10 reps - Seated Shoulder Flexion AAROM with Pulley Behind  - 2 x daily - 7 x weekly - 2-3 sets - 10-20 reps - Seated Shoulder Abduction AAROM with Pulley Behind  - 2 x daily - 7 x weekly - 2-3 sets - 10-20 reps - Seated Shoulder External Rotation AAROM with Cane and Hand in Neutral  - 2 x daily  - 7 x weekly - 2 sets - 10-12 reps  ASSESSMENT:  CLINICAL IMPRESSION: Patient reports to OPPT for continued treatment in management s/p Reverse Total Shoulder Arthroplasty 02/17/2024. She is currently 1 week post op and demonstrates improvements in ROM as expected. Continued focus on improving R shoulder ROM from PROM into AAROM As tolerated. Patient consistently achieving 90 deg shoulder flexion  and 60 deg abd AROM without pain. Additional exercises for scapular strength addressed in today's session. Mod cues for proper scapular retraction and protraction with good return from patient. Patient's deficits are limiting her full participation with independent ADLs and recreational tasks. Based on today's performance, pt will continue to benefit from skilled PT in order to facilitate return to PLOF and improve QoL.   OBJECTIVE IMPAIRMENTS: decreased activity tolerance, decreased ROM, decreased strength, increased edema, increased muscle spasms, impaired sensation, and pain.   ACTIVITY LIMITATIONS: carrying, lifting, transfers, bathing, dressing, reach over head, and hygiene/grooming  PARTICIPATION LIMITATIONS: driving, community activity, and church  PERSONAL FACTORS: Age, Past/current experiences, Social background, Time since onset of injury/illness/exacerbation, and 3+ comorbidities: S/P TAVR (transcatheter aortic valve replacement), 1 OA in LUE, Hx of prev surgeries (THA, TKA SEE ABOVE) are also affecting patient's functional outcome.   REHAB POTENTIAL: Good  CLINICAL DECISION MAKING: Evolving/moderate complexity  EVALUATION COMPLEXITY: Moderate   GOALS: Goals reviewed with patient? Yes  SHORT TERM GOALS: Target date: 03/26/2024  Pt will be independent with HEP to improve strength and decrease shoulder pain to improve pain-free function at home and work. Baseline: Initial provided Goal status: INITIAL   LONG TERM GOALS: Target date: 04/23/2024  Pt will increase L shoulder AROM  flexion/abduction to 130 deg without pain in order to demonstrate improvements for OH reaching and MD protocol.  Baseline: < 30 deg AROM flex/abd Goal status: INITIAL  2.  Pt will decrease worst shoulder pain by at least 3 points on the NPRS in order to demonstrate clinically significant reduction in shoulder pain. Baseline: 7/10  Goal status: INITIAL  3.  Pt will decrease quick DASH score by at least 8% in order to demonstrate clinically  significant reduction in disability related to shoulder pain        Baseline: 77.3 / 100 = 77.3 %  Goal status: INITIAL  4. Pt will increase strength in shoulder flexion and abduction by at least 1/2 MMT grade in order to demonstrate improvement in strength and function         Baseline: Not Tested  Goal status: INITIAL  PLAN:  PT FREQUENCY: 1-2x/week  PT DURATION: 12 weeks  PLANNED INTERVENTIONS: [id number]- PT Re-evaluation, 97750- Physical Performance Testing, 97110-Therapeutic exercises, 97530- Therapeutic activity, [id number]- Neuromuscular re-education, 97535- Self Care, [id number]- Manual therapy, 97760- Orthotic Initial, [id number]- Orthotic/Prosthetic subsequent, Patient/Family education, Joint mobilization, Scar mobilization, Cryotherapy, and Moist heat  PLAN FOR NEXT SESSION: Review HEP, initiate PROM > AAROM > AROM exercises for R GHJ, Scapular exercises, elbow AROM and wrist AROM   Lonni Pall PT, DPT Physical Therapist- [place]  02/27/2024, 2:48 PM  "

## 2024-02-28 ENCOUNTER — Ambulatory Visit (INDEPENDENT_AMBULATORY_CARE_PROVIDER_SITE_OTHER): Admitting: Orthopaedic Surgery

## 2024-02-28 ENCOUNTER — Ambulatory Visit (INDEPENDENT_AMBULATORY_CARE_PROVIDER_SITE_OTHER)

## 2024-02-28 DIAGNOSIS — M19011 Primary osteoarthritis, right shoulder: Secondary | ICD-10-CM

## 2024-02-28 NOTE — Progress Notes (Signed)
 "                                 Post Operative Evaluation    Procedure/Date of Surgery: Right shoulder reverse shoulder arthroplasty 1/12  Interval History:    Since 2 weeks status post above procedure.  Overall she is doing extremely well.  She is compliant with sling usage.   PMH/PSH/Family History/Social History/Meds/Allergies:    Past Medical History:  Diagnosis Date   Allergy    Arthritis    Coronary artery disease    Fatty liver    GERD (gastroesophageal reflux disease)    Heart murmur    Hypertension    Hx   LIVER FUNCTION TESTS, ABNORMAL, HX OF 09/08/2007   Qualifier: Diagnosis of  By: Nelson-Smith CMA (AAMA), Dottie     Neuromuscular disorder [facility])    Essential Tremor   Overactive bladder    Past Surgical History:  Procedure Laterality Date   ABDOMINAL HYSTERECTOMY     CARDIAC VALVE REPLACEMENT  11/13/2023   COLONOSCOPY WITH PROPOFOL  N/A 09/04/2018   Procedure: COLONOSCOPY WITH PROPOFOL ;  Surgeon: Janalyn Keene NOVAK, MD;  Location: [facility];  Service: Endoscopy;  Laterality: N/A;   ESOPHAGOGASTRODUODENOSCOPY (EGD) WITH PROPOFOL  N/A 09/04/2018   Procedure: ESOPHAGOGASTRODUODENOSCOPY (EGD) WITH PROPOFOL ;  Surgeon: Janalyn Keene NOVAK, MD;  Location: [facility];  Service: Endoscopy;  Laterality: N/A;   LAPAROSCOPY     for endometriosis x2   MOUTH SURGERY  04/2023   tooth extraction, gum graft   REVERSE SHOULDER ARTHROPLASTY Right 02/17/2024   Procedure: ARTHROPLASTY, SHOULDER, TOTAL, REVERSE RIGHT WITH steroid injection Left shoulder;  Surgeon: Genelle Standing, MD;  Location: [facility];  Service: Orthopedics;  Laterality: Right;   RIGHT/LEFT HEART CATH AND CORONARY ANGIOGRAPHY Bilateral 09/19/2023   Procedure: RIGHT/LEFT HEART CATH AND CORONARY ANGIOGRAPHY;  Surgeon: Ammon Blunt, MD;  Location: [facility];  Service: Cardiovascular;  Laterality: Bilateral;   TONSILLECTOMY     TOTAL HIP ARTHROPLASTY Right  05/15/2022   Procedure: RIGHT TOTAL HIP ARTHROPLASTY ANTERIOR APPROACH;  Surgeon: Vernetta Lonni GRADE, MD;  Location: [facility];  Service: Orthopedics;  Laterality: Right;   TOTAL KNEE ARTHROPLASTY Left 07/18/2021   Procedure: TOTAL KNEE ARTHROPLASTY;  Surgeon: Ernie Cough, MD;  Location: [facility];  Service: Orthopedics;  Laterality: Left;   TURBINATE REDUCTION     Social History   Socioeconomic History   Marital status: Widowed    Spouse name: Not on file   Number of children: Not on file   Years of education: Not on file   Highest education level: Bachelor's degree (e.g., BA, AB, BS)  Occupational History   Not on file  Tobacco Use   Smoking status: Never    Passive exposure: Never   Smokeless tobacco: Never  Vaping Use   Vaping status: Never Used  Substance and Sexual Activity   Alcohol use: Not Currently    Alcohol/week: 1.0 standard drink of alcohol    Types: 1 Glasses of wine per week   Drug use: No   Sexual activity: Not Currently  Other Topics Concern   Not on file  Social History Narrative   Not on file   Social Drivers of Health   Tobacco Use: Low Risk (02/27/2024)   Patient History    Smoking Tobacco Use: Never    Smokeless Tobacco Use: Never    Passive Exposure: Never  Financial Resource Strain: Low Risk (12/31/2023)  Overall Financial Resource Strain (CARDIA)    Difficulty of Paying Living Expenses: Not hard at all  Food Insecurity: No Food Insecurity (12/31/2023)   Epic    Worried About Programme Researcher, Broadcasting/film/video in the Last Year: Never true    Ran Out of Food in the Last Year: Never true  Transportation Needs: No Transportation Needs (12/31/2023)   Epic    Lack of Transportation (Medical): No    Lack of Transportation (Non-Medical): No  Physical Activity: Inactive (12/31/2023)   Exercise Vital Sign    Days of Exercise per Week: 0 days    Minutes of Exercise per Session: 0 min  Stress: No Stress Concern Present (12/31/2023)   [employer] of  Occupational Health - Occupational Stress Questionnaire    Feeling of Stress: Only a little  Social Connections: Moderately Integrated (12/31/2023)   Social Connection and Isolation Panel    Frequency of Communication with Friends and Family: More than three times a week    Frequency of Social Gatherings with Friends and Family: More than three times a week    Attends Religious Services: More than 4 times per year    Active Member of [employer] or Organizations: Yes    Attends Banker Meetings: More than 4 times per year    Marital Status: Widowed  Depression (PHQ2-9): Low Risk (12/31/2023)   Depression (PHQ2-9)    PHQ-2 Score: 2  Recent Concern: Depression (PHQ2-9) - Medium Risk (10/10/2023)   Depression (PHQ2-9)    PHQ-2 Score: 7  Alcohol Screen: Low Risk (12/31/2023)   Alcohol Screen    Last Alcohol Screening Score (AUDIT): 0  Housing: Low Risk (12/31/2023)   Epic    Unable to Pay for Housing in the Last Year: No    Number of Times Moved in the Last Year: 0    Homeless in the Last Year: No  Utilities: Not At Risk (12/31/2023)   Epic    Threatened with loss of utilities: No  Health Literacy: Adequate Health Literacy (12/31/2023)   B1300 Health Literacy    Frequency of need for help with medical instructions: Never   Family History  Problem Relation Age of Onset   Alcohol abuse Mother    Arthritis Mother    Hyperlipidemia Mother    Heart disease Mother    Stroke Mother    Hypertension Mother    Depression Mother    Anxiety disorder Mother    Arthritis Father    Lung cancer Paternal Grandfather    Kidney disease Paternal Grandfather    Allergies[1] Current Outpatient Medications  Medication Sig Dispense Refill   Abatacept  (ORENCIA  IV) Inject 1 application  into the vein every 30 (thirty) days.     acetaminophen  (TYLENOL ) 500 MG tablet Take 1,000 mg by mouth 2 (two) times daily. (Patient not taking: Reported on 12/31/2023)     Ascorbic Acid (VITAMIN C PO) Take  500 mg by mouth daily.     b complex vitamins capsule Take 1 capsule by mouth daily. With vit C     CALCIUM  PO Take 1,200 mg by mouth daily.     celecoxib  (CELEBREX ) 200 MG capsule Take 200 mg by mouth daily.     cycloSPORINE  (RESTASIS ) 0.05 % ophthalmic emulsion Place 1 drop into both eyes 2 (two) times daily.     Estradiol (VAGIFEM) 10 MCG TABS vaginal tablet Take 10 mcg by mouth 2 (two) times a week.     fexofenadine (ALLEGRA) 180 MG tablet Take 180  mg by mouth daily. (Patient taking differently: Take 180 mg by mouth daily as needed.)     Glucosamine HCl 1500 MG TABS Take 1,500 mg by mouth daily. (Patient not taking: Reported on 12/31/2023)     Iodine  Strong, Lugols, (IODINE  STRONG PO) Take 1 Dose by mouth daily. 1950 mcg. 3 drops per day.     lidocaine  (LIDODERM ) 5 % Place 2-3 patches onto the skin daily.     MAGNESIUM  PO Take 1,250 mg by mouth 2 (two) times a week. Glycinate     methocarbamol  (ROBAXIN ) 750 MG tablet Take 750 mg by mouth at bedtime. (Patient taking differently: Take 750 mg by mouth 3 times/day as needed-between meals & bedtime.)     Multiple Vitamin (MULTIVITAMIN WITH MINERALS) TABS tablet Take 1 tablet by mouth daily.     mupirocin  ointment (BACTROBAN ) 2 % Apply 1 Application topically daily. Apply to each nare 3 g 0   Omega-3 Fatty Acids (FISH OIL PO) Take 750 mg by mouth daily.     OVER THE COUNTER MEDICATION Apply 1 Application topically daily at 12 noon. frankincense     oxyCODONE  (OXY IR/ROXICODONE ) 5 MG immediate release tablet Take 5 mg by mouth at bedtime.     polyethylene glycol powder (GLYCOLAX /MIRALAX ) 17 GM/SCOOP powder Take 17 g by mouth daily as needed. Dissolve 1 capful (17g) in 4-8 ounces of liquid and take by mouth daily.     prednisoLONE acetate (PRED FORTE) 1 % ophthalmic suspension 3 (three) times daily (Patient not taking: Reported on 12/31/2023)     senna-docusate (SENOKOT-S) 8.6-50 MG tablet Take 1 tablet by mouth. (Patient not taking: Reported on  12/31/2023)     trolamine salicylate (BLUE-EMU HEMP) 10 % cream Apply 1 Application topically as needed for muscle pain.     VITAMIN D -VITAMIN K PO Take 2 mLs by mouth daily.     No current facility-administered medications for this visit.   No results found.  Review of Systems:   A ROS was performed including pertinent positives and negatives as documented in the HPI.   Musculoskeletal Exam:    There were no vitals taken for this visit.  Right shoulder incisions well-appearing without erythema or drainage.  The spine position she can forward elevate to 90 degrees with external rotation at side to 30 degrees.  Imaging:    3 views right shoulder: Status post reverse shoulder arthroplasty without evidence complication  I personally reviewed and interpreted the radiographs.   Assessment:   2 weeks status post right shoulder reverse shoulder arthroplasty without evidence of complication.  Overall she is doing very well.  She will continue to advance according to the reverse shoulder arthroplasty protocol  Plan :    - Return to clinic 4 weeks for reassessment      I personally saw and evaluated the patient, and participated in the management and treatment plan.  Elspeth Parker, MD Attending Physician, Orthopedic Surgery  This document was dictated using Dragon voice recognition software. A reasonable attempt at proof reading has been made to minimize errors.    [1]  Allergies Allergen Reactions   Penicillins Itching    Tolerated Cephalosporin 07/18/21.   Other reaction(s): wheezing   Amoxicillin Itching and Other (See Comments)   Misc. Sulfonamide Containing Compounds    Penicillin G     Other Reaction(s): Not available   Rosanil Cleanser [Sulfacetamide Sodium-Sulfur] Other (See Comments)    Unknown reaction    Sulfonamide Derivatives     Unknown reaction  Wound Dressing Adhesive Dermatitis    Pt states band aids rip her skin  Skin irritation   Ylang-Ylang  [Cananga Oil (Ylang-Ylang)] Itching   "

## 2024-03-02 ENCOUNTER — Ambulatory Visit

## 2024-03-03 ENCOUNTER — Ambulatory Visit

## 2024-03-05 ENCOUNTER — Ambulatory Visit

## 2024-03-10 ENCOUNTER — Ambulatory Visit

## 2024-03-10 DIAGNOSIS — M6281 Muscle weakness (generalized): Secondary | ICD-10-CM

## 2024-03-10 DIAGNOSIS — M25611 Stiffness of right shoulder, not elsewhere classified: Secondary | ICD-10-CM

## 2024-03-10 DIAGNOSIS — M25511 Pain in right shoulder: Secondary | ICD-10-CM

## 2024-03-12 ENCOUNTER — Ambulatory Visit

## 2024-03-12 DIAGNOSIS — M25511 Pain in right shoulder: Secondary | ICD-10-CM

## 2024-03-12 DIAGNOSIS — M6281 Muscle weakness (generalized): Secondary | ICD-10-CM

## 2024-03-12 DIAGNOSIS — M25611 Stiffness of right shoulder, not elsewhere classified: Secondary | ICD-10-CM

## 2024-03-12 NOTE — Therapy (Signed)
 " OUTPATIENT PHYSICAL THERAPY SHOULDER/ELBOW TREATMENT  Patient Name: Wanda Lang MRN: [id number] DOB:08-09-1950, 74 y.o., female Today's Date: 03/12/2024  END OF SESSION:  PT End of Session - 03/12/24 1306     Visit Number 4    Number of Visits 25    Date for Recertification  05/13/24    Authorization Type Auth 25 PT visits    Authorization #[id number]    Authorization Time Period 1/4 - 4/8    Authorization - Visit Number 4    Authorization - Number of Visits 25    PT Start Time 1305    PT Stop Time 1345    PT Time Calculation (min) 40 min    Equipment Utilized During Treatment Other (comment)   Shoulder Immobilizer   Activity Tolerance Patient tolerated treatment well    Behavior During Therapy WFL for tasks assessed/performed            Past Medical History:  Diagnosis Date   Allergy    Arthritis    Coronary artery disease    Fatty liver    GERD (gastroesophageal reflux disease)    Heart murmur    Hypertension    Hx   LIVER FUNCTION TESTS, ABNORMAL, HX OF 09/08/2007   Qualifier: Diagnosis of  By: Nelson-Smith CMA (AAMA), Dottie     Neuromuscular disorder [facility])    Essential Tremor   Overactive bladder    Past Surgical History:  Procedure Laterality Date   ABDOMINAL HYSTERECTOMY     CARDIAC VALVE REPLACEMENT  11/13/2023   COLONOSCOPY WITH PROPOFOL  N/A 09/04/2018   Procedure: COLONOSCOPY WITH PROPOFOL ;  Surgeon: Janalyn Keene NOVAK, MD;  Location: [facility];  Service: Endoscopy;  Laterality: N/A;   ESOPHAGOGASTRODUODENOSCOPY (EGD) WITH PROPOFOL  N/A 09/04/2018   Procedure: ESOPHAGOGASTRODUODENOSCOPY (EGD) WITH PROPOFOL ;  Surgeon: Janalyn Keene NOVAK, MD;  Location: [facility];  Service: Endoscopy;  Laterality: N/A;   LAPAROSCOPY     for endometriosis x2   MOUTH SURGERY  04/2023   tooth extraction, gum graft   REVERSE SHOULDER ARTHROPLASTY Right 02/17/2024   Procedure: ARTHROPLASTY, SHOULDER, TOTAL, REVERSE RIGHT WITH steroid injection Left shoulder;   Surgeon: Genelle Standing, MD;  Location: [facility];  Service: Orthopedics;  Laterality: Right;   RIGHT/LEFT HEART CATH AND CORONARY ANGIOGRAPHY Bilateral 09/19/2023   Procedure: RIGHT/LEFT HEART CATH AND CORONARY ANGIOGRAPHY;  Surgeon: Ammon Blunt, MD;  Location: [facility];  Service: Cardiovascular;  Laterality: Bilateral;   TONSILLECTOMY     TOTAL HIP ARTHROPLASTY Right 05/15/2022   Procedure: RIGHT TOTAL HIP ARTHROPLASTY ANTERIOR APPROACH;  Surgeon: Vernetta Lonni GRADE, MD;  Location: [facility];  Service: Orthopedics;  Laterality: Right;   TOTAL KNEE ARTHROPLASTY Left 07/18/2021   Procedure: TOTAL KNEE ARTHROPLASTY;  Surgeon: Ernie Cough, MD;  Location: [facility];  Service: Orthopedics;  Laterality: Left;   TURBINATE REDUCTION     Patient Active Problem List   Diagnosis Date Noted   Osteoarthritis of right shoulder 02/17/2024   Primary osteoarthritis, left shoulder 02/17/2024   S/P TAVR (transcatheter aortic valve replacement) 12/05/2023   Skin wound from surgical incision 12/05/2023   Elevated LFTs 10/10/2023   Protracted URI 05/03/2023   Acute non-recurrent pansinusitis 02/04/2023   OSA (obstructive sleep apnea) 11/14/2022   Arthritis 11/14/2022   Nocturia 10/05/2022   Hypersomnia 10/05/2022   S/P total hip arthroplasty 05/17/2022   Status post total replacement of right hip 05/15/2022   Overactive bladder 04/04/2022   Postmenopausal estrogen deficiency 11/13/2021   Primary osteoarthritis 11/13/2021  Leg length inequality 11/06/2021   Impaired ambulation 10/23/2021   Contact dermatitis 10/16/2021   SI joint arthritis 08/24/2021   Spinal stenosis, lumbar region, without neurogenic claudication 08/24/2021   Degeneration of lumbar intervertebral disc 07/24/2021   S/P total knee arthroplasty, left 07/18/2021   Preop examination 07/07/2021   Right hip pain 07/07/2021   Low back pain 07/07/2021   Drug-induced immunodeficiency 06/12/2021    Impingement syndrome of right shoulder region 06/12/2021   COVID-19 01/24/2021   Heart murmur 11/24/2020   Hammer toe 04/22/2020   Metatarsalgia of left foot 04/22/2020   Osteoarthritis of midfoot 04/22/2020   Duodenal ulcer 11/12/2019   Stress 11/12/2019   Morbid obesity (HCC) 11/06/2019   Pain in left knee 07/30/2019   Aortic stenosis, moderate 02/06/2018   Chronic diarrhea 10/09/2017   Acid reflux 07/15/2017   Asymptomatic carotid artery stenosis 07/15/2017   Cyst of skin 07/15/2017   Right maxillary sinusitis 11/09/2016   Immunocompromised 11/09/2016   Allergic rhinitis 11/02/2016   Benign essential tremor 11/02/2016   History of hypertension 11/02/2016   Postmenopausal symptoms 07/01/2015   Fatty liver 09/08/2007   Rheumatoid arthritis (HCC) 09/08/2007    PCP: Gretel App, NP    REFERRING PROVIDER: Genelle Standing, MD  REFERRING DIAG:  M19.011 (ICD-10-CM) - Osteoarthritis of right shoulder, unspecified osteoarthritis type      RATIONALE FOR EVALUATION AND TREATMENT: Rehabilitation  THERAPY DIAG: Stiffness of right shoulder, not elsewhere classified  Right shoulder pain, unspecified chronicity  Muscle weakness (generalized)  ONSET DATE: Date of Surgery: 02/17/2024  FOLLOW-UP APPT SCHEDULED WITH REFERRING PROVIDER: Yes    SUBJECTIVE:                                                                                                                                                                                         SUBJECTIVE STATEMENT:    Patient arrives to OPPT with a chief concern of R shoulder pain and stiffness s/p Reverse Total Shoulder Arthroplasty on 02/17/2024.   PERTINENT HISTORY:   Wanda Lang is a 74 y.o. female presenting to OPPT s/p R RTSA with biceps tenodesis on 02/17/2024. Since the surgery the patient has rested with R Shoulder in shoulder immobilizer. She reports that she has been going well. She reports that her sister is taking care  of her for the next few days. She reports that she's been actively moving her elbow and wrist joint however she has questions about moving the shoulder joint. Patient reports that moved her arm passively through external rotation but she knows not to reach behind her back.   PMH significant for: Hx of Surgery: R  THA 04/24; L TKA 06/23  Dominant hand: right  Imaging: Yes   Red flags (chills/fever, night sweats, nausea, vomiting, unrelenting pain, unexplained weight gain/loss): Negative.  PAIN:  Pain Intensity: Present: 2/10, Best: 0/10, Worst: 2/10 (Nerve Block) Pain location: Incisional Site  Pain Quality: constant and aching itches Radiating: No  Numbness/Tingling: No  She believes the nerve block is still in the system  Aggravating factors: Haven't moved shoulder into any positions of provocation  Relieving factors: Veterinary Surgeon and Rest   PRECAUTIONS: Shoulder  WEIGHT BEARING RESTRICTIONS: Yes Avoid WBing  FALLS: Has patient fallen in last 6 months? No  Living Environment Lives with: lives alone Lives in: House/apartment 2-Story  Stairs: Yes: Internal: 13-15 steps; can reach both Has following equipment at home: Chair lift on stairs; walking stick, FWW  Prior level of function: Independent  Occupational demands: Retired   Patient Goals: Patient would like to dress comfortably and use her arm like normal     OBJECTIVE:   Patient Surveys   QuickDASH: QUICK DASH  Please rate your ability do the following activities in the last week by selecting the number below the appropriate response.   Activities Rating  Open a tight or new jar.  5 = Unable  Do heavy household chores (e.g., wash walls, floors). 5 = Unable  Carry a shopping bag or briefcase 5 = Unable  Wash your back. 5 = Unable  Use a knife to cut food. 4 = Severe difficulty  Recreational activities in which you take some force or impact through your arm, shoulder or hand (e.g., golf, hammering,  tennis, etc.). 5 = Unable  During the past week, to what extent has your arm, shoulder or hand problem interfered with your normal social activities with family, friends, neighbors or groups?  5 = Extremely  During the past week, were you limited in your work or other regular daily activities as a result of your arm, shoulder or hand problem? 5 = Unable  Rate the severity of the following symptoms in the last week: Arm, Shoulder, or hand pain. 2 = Mild  Rate the severity of the following symptoms in the last week: Tingling (pins and needles) in your arm, shoulder or hand. 2 = Mild  During the past week, how much difficulty have you had sleeping because of the pain in your arm, shoulder or hand?  2 = Mild difficulty   (A QuickDASH score may not be calculated if there is greater than 1 missing item.)  Quick Dash Disability/Symptom Score: 77.3 / 100 = 77.3 % (0/100 = no disability)  Minimally Clinically Important Difference (MCID): 15-20 points  (Franchignoni, F. et al. (2013). Minimally clinically important difference of the disabilities of the arm, shoulder, and hand outcome measures (DASH) and its shortened version (Quick DASH). Journal of Orthopaedic & Sports Physical Therapy, 44(1), 30-39)   Cognition Overall cognitive status: WFL for tasks assessed.     Gross Musculoskeletal Assessment Tremor: None Bulk: Normal Tone: Normal  Gait   Posture   Cervical Screen AROM: WFL and painless with overpressure in all planes   AROM AROM (Normal range in degrees) AROM  Cervical  Flexion (50) WFL  Extension (80) WFL  Right lateral flexion (45) WFL  Left lateral flexion (45) WFL  Right rotation (85) WFL  Left rotation (85) WFL   Right Left  Shoulder    Flexion  160  Extension    Abduction  140  External Rotation  75  Internal Rotation  Hands Behind Head    Hands Behind Back        Elbow    Flexion WNL WNL  Extension WNL WNL  Pronation    Supination    (* = pain; Blank  rows = not tested)  UE MMT: MMT (out of 5) Right Left   Cervical (isometric)  Flexion WNL  Extension WNL  Lateral Flexion WNL WNL  Rotation WNL WNL      Shoulder   Flexion  4  Extension    Abduction  4  External rotation  4-  Internal rotation  4-  Horizontal abduction    Horizontal adduction    Lower Trapezius    Rhomboids        Elbow  Flexion  4  Extension  4  Pronation    Supination        (* = pain; Blank rows = not tested)  Sensation Grossly intact to light touch bilateral UE as determined by testing dermatomes C2-T2. Proprioception and hot/cold testing deferred on this date.  Reflexes Deferred  Palpation Location LEFT  RIGHT           Subocciptials    Cervical paraspinals  0  Upper Trapezius  1  Levator Scapulae    Rhomboid Major/Minor    Sternoclavicular joint    Acromioclavicular joint    Coracoid process    Long head of biceps    Supraspinatus  0  Infraspinatus  0  Subscapularis    Teres Minor    Teres Major    Pectoralis Major    Pectoralis Minor    Anterior Deltoid  1  Lateral Deltoid  1  Posterior Deltoid    Latissimus Dorsi    Sternocleidomastoid    (Blank rows = not tested) Graded on 0-4 scale (0 = no pain, 1 = pain, 2 = pain with wincing/grimacing/flinching, 3 = pain with withdrawal, 4 = unwilling to allow palpation), (Blank rows = not tested)   Passive Accessory Intervertebral Motion Deferred  Accessory Motions/Glides Glenohumeral: Deferred  Posterior:  Inferior:  Anterior:   Information including subjective and objective data above this line was taken at the initial evaluation. ______________________________________________________________________________________________________  TODAY'S TREATMENT: DATE: 03/12/24  Subjective: Patient reports improvements with shoulder flexion and abduction; she has practiced her isometric holds at home. Pain currently rates her pain at a 3/10 in the lateral R shoulder. No further  questions or concerns.   Therapeutic Exercise:  Standing Shoulder Pendulum   1 x 10 CW  1 x 10 CCW   Lat Pulldown   1 x 10 reps - Blue TB  2 x 10 reps - Green TB   Standing Scapular Row   1 x 10 - Green   2 x 10 - Blue TB   AROM - Shoulder ER   20 reps - able to achieve 25 deg of ER however pain at end range   Serratus Punches  3 x 10 reps    Therapeutic Activity (with the intent of improving OH reaching, lifting and UE activities):  Foam Roller Wall Slide   1 x 10   AAROM Shoulder Flexion with TRX ropes  1 x 10   AAROM  Shoulder Flexion with Dowel  1 x 10   AAROM Shoulder Abduction  3 x 10 reps   PATIENT EDUCATION:  Education details: Exercise Technique  Person educated: Patient Education method: Explanation, Demonstration, Tactile cues, and Handouts Education comprehension: verbalized understanding, returned demonstration, and needs further education  HOME EXERCISE PROGRAM:  Access Code: [email or web address] URL: [email or web address] Date: 02/25/2024 Prepared by: Lonni Dyshon Philbin  Exercises - Seated Shoulder Flexion Towel Slide at Table Top  - 2 x daily - 7 x weekly - 2-3 sets - 10 reps - Seated Shoulder Abduction Towel Slide at Table Top  - 2 x daily - 7 x weekly - 2-3 sets - 10 reps - Seated Shoulder Pendulum Exercise  - 2 x daily - 7 x weekly - 2-3 sets - 10 reps - Forearm Pronation and Supination With Shoulder Sling  - 2 x daily - 7 x weekly - 3 sets - 10 reps - Closing and Opening Hand With Shoulder Sling  - 2 x daily - 7 x weekly - 3 sets - 10 reps - Shoulder Rolls in Sitting  - 2 x daily - 7 x weekly - 3 sets - 10 reps - Seated Shoulder Flexion AAROM with Pulley Behind  - 2 x daily - 7 x weekly - 2-3 sets - 10-20 reps - Seated Shoulder Abduction AAROM with Pulley Behind  - 2 x daily - 7 x weekly - 2-3 sets - 10-20 reps - Seated Shoulder External Rotation AAROM with Cane and Hand in Neutral  - 2 x daily - 7 x weekly - 2 sets - 10-12  reps  ASSESSMENT:  CLINICAL IMPRESSION: Patient reports to OPPT for continued treatment in management s/p Reverse Total Shoulder Arthroplasty 02/17/2024. She is currently 3 week post op and demonstrates improvements in ROM as expected. Continued PT POC from previous session; she tolerated all interventions without exacerbation of R shoulder pain. Parascapular strengthening applied today in order to improve scapular kinematics. R shoulder AROM still limited and pt unable to reach [facility] without assistance from other OH. atient's deficits are limiting her full participation with independent ADLs and recreational tasks. Based on today's performance, pt will continue to benefit from skilled PT in order to facilitate return to PLOF and improve QoL.   OBJECTIVE IMPAIRMENTS: decreased activity tolerance, decreased ROM, decreased strength, increased edema, increased muscle spasms, impaired sensation, and pain.   ACTIVITY LIMITATIONS: carrying, lifting, transfers, bathing, dressing, reach over head, and hygiene/grooming  PARTICIPATION LIMITATIONS: driving, community activity, and church  PERSONAL FACTORS: Age, Past/current experiences, Social background, Time since onset of injury/illness/exacerbation, and 3+ comorbidities: S/P TAVR (transcatheter aortic valve replacement), 1 OA in LUE, Hx of prev surgeries (THA, TKA SEE ABOVE) are also affecting patient's functional outcome.   REHAB POTENTIAL: Good  CLINICAL DECISION MAKING: Evolving/moderate complexity  EVALUATION COMPLEXITY: Moderate   GOALS: Goals reviewed with patient? Yes  SHORT TERM GOALS: Target date: 03/18/2024   Pt will be independent with HEP to improve strength and decrease shoulder pain to improve pain-free function at home and work. Baseline: Initial provided Goal status: INITIAL   LONG TERM GOALS: Target date: 04/15/2024  Pt will increase L shoulder AROM flexion/abduction to 130 deg without pain in order to demonstrate  improvements for OH reaching and MD protocol.  Baseline: < 30 deg AROM flex/abd Goal status: INITIAL  2.  Pt will decrease worst shoulder pain by at least 3 points on the NPRS in order to demonstrate clinically significant reduction in shoulder pain. Baseline: 7/10  Goal status: INITIAL  3.  Pt will decrease quick DASH score by at least 8% in order to demonstrate clinically significant reduction in disability related to shoulder pain        Baseline: 77.3 / 100 = 77.3 %  Goal status: INITIAL  4. Pt  will increase strength in shoulder flexion and abduction by at least 1/2 MMT grade in order to demonstrate improvement in strength and function         Baseline: Not Tested  Goal status: INITIAL  PLAN:  PT FREQUENCY: 1-2x/week  PT DURATION: 12 weeks  PLANNED INTERVENTIONS: [id number]- PT Re-evaluation, 97750- Physical Performance Testing, 97110-Therapeutic exercises, 97530- Therapeutic activity, [id number]- Neuromuscular re-education, 97535- Self Care, [id number]- Manual therapy, 97760- Orthotic Initial, [id number]- Orthotic/Prosthetic subsequent, Patient/Family education, Joint mobilization, Scar mobilization, Cryotherapy, and Moist heat  PLAN FOR NEXT SESSION: Review HEP, initiate PROM > AAROM > AROM exercises for R GHJ, Scapular exercises, elbow AROM and wrist AROM   Lonni Pall PT, DPT Physical Therapist- [place]  03/12/2024, 1:11 PM  "

## 2024-03-16 ENCOUNTER — Ambulatory Visit

## 2024-03-17 ENCOUNTER — Ambulatory Visit

## 2024-03-18 ENCOUNTER — Ambulatory Visit

## 2024-03-19 ENCOUNTER — Ambulatory Visit

## 2024-03-23 ENCOUNTER — Ambulatory Visit

## 2024-03-24 ENCOUNTER — Ambulatory Visit

## 2024-03-25 ENCOUNTER — Ambulatory Visit

## 2024-03-26 ENCOUNTER — Ambulatory Visit

## 2024-03-30 ENCOUNTER — Ambulatory Visit

## 2024-03-31 ENCOUNTER — Ambulatory Visit

## 2024-04-01 ENCOUNTER — Ambulatory Visit

## 2024-04-01 ENCOUNTER — Encounter (HOSPITAL_BASED_OUTPATIENT_CLINIC_OR_DEPARTMENT_OTHER): Admitting: Orthopaedic Surgery

## 2024-04-02 ENCOUNTER — Ambulatory Visit

## 2024-04-06 ENCOUNTER — Ambulatory Visit

## 2024-04-08 ENCOUNTER — Ambulatory Visit

## 2024-04-09 ENCOUNTER — Ambulatory Visit

## 2024-04-13 ENCOUNTER — Ambulatory Visit

## 2024-04-14 ENCOUNTER — Ambulatory Visit

## 2024-04-15 ENCOUNTER — Ambulatory Visit

## 2024-04-16 ENCOUNTER — Ambulatory Visit

## 2024-04-20 ENCOUNTER — Ambulatory Visit

## 2024-04-21 ENCOUNTER — Ambulatory Visit

## 2024-04-22 ENCOUNTER — Ambulatory Visit

## 2024-04-23 ENCOUNTER — Ambulatory Visit

## 2024-04-27 ENCOUNTER — Ambulatory Visit

## 2024-04-28 ENCOUNTER — Ambulatory Visit

## 2024-04-29 ENCOUNTER — Ambulatory Visit

## 2024-04-30 ENCOUNTER — Ambulatory Visit

## 2024-05-04 ENCOUNTER — Ambulatory Visit

## 2024-05-06 ENCOUNTER — Ambulatory Visit

## 2024-05-11 ENCOUNTER — Ambulatory Visit

## 2024-06-03 ENCOUNTER — Ambulatory Visit: Admitting: Nurse Practitioner

## 2025-01-11 ENCOUNTER — Ambulatory Visit
